# Patient Record
Sex: Female | Born: 1935
Health system: Southern US, Community
[De-identification: ages and names within clinical notes are randomized; demographics above are authoritative.]

## PROBLEM LIST (undated history)

## (undated) DIAGNOSIS — E785 Hyperlipidemia, unspecified: Secondary | ICD-10-CM

## (undated) DIAGNOSIS — J189 Pneumonia, unspecified organism: Secondary | ICD-10-CM

## (undated) DIAGNOSIS — A419 Sepsis, unspecified organism: Secondary | ICD-10-CM

## (undated) DIAGNOSIS — F0390 Unspecified dementia without behavioral disturbance: Secondary | ICD-10-CM

## (undated) DIAGNOSIS — R569 Unspecified convulsions: Secondary | ICD-10-CM

## (undated) DIAGNOSIS — N390 Urinary tract infection, site not specified: Secondary | ICD-10-CM

## (undated) DIAGNOSIS — I639 Cerebral infarction, unspecified: Secondary | ICD-10-CM

## (undated) HISTORY — PX: CATARACT EXTRACTION: SUR2

---

## 1999-03-19 ENCOUNTER — Ambulatory Visit (HOSPITAL_COMMUNITY): Admission: RE | Admit: 1999-03-19 | Discharge: 1999-03-19 | Payer: Self-pay | Admitting: Internal Medicine

## 1999-03-19 ENCOUNTER — Encounter: Payer: Self-pay | Admitting: Internal Medicine

## 1999-04-02 ENCOUNTER — Ambulatory Visit (HOSPITAL_COMMUNITY): Admission: RE | Admit: 1999-04-02 | Discharge: 1999-04-02 | Payer: Self-pay | Admitting: Internal Medicine

## 2000-01-21 ENCOUNTER — Encounter: Payer: Self-pay | Admitting: Ophthalmology

## 2000-01-22 ENCOUNTER — Ambulatory Visit (HOSPITAL_COMMUNITY): Admission: RE | Admit: 2000-01-22 | Discharge: 2000-01-23 | Payer: Self-pay | Admitting: Ophthalmology

## 2000-06-28 ENCOUNTER — Other Ambulatory Visit: Admission: RE | Admit: 2000-06-28 | Discharge: 2000-06-28 | Payer: Self-pay | Admitting: Internal Medicine

## 2001-07-13 ENCOUNTER — Encounter: Payer: Self-pay | Admitting: Internal Medicine

## 2001-07-13 ENCOUNTER — Encounter: Admission: RE | Admit: 2001-07-13 | Discharge: 2001-07-13 | Payer: Self-pay | Admitting: Internal Medicine

## 2001-07-25 ENCOUNTER — Encounter: Payer: Self-pay | Admitting: Internal Medicine

## 2001-07-25 ENCOUNTER — Encounter: Admission: RE | Admit: 2001-07-25 | Discharge: 2001-07-25 | Payer: Self-pay | Admitting: Internal Medicine

## 2001-07-25 ENCOUNTER — Encounter (INDEPENDENT_AMBULATORY_CARE_PROVIDER_SITE_OTHER): Payer: Self-pay | Admitting: *Deleted

## 2003-12-12 ENCOUNTER — Ambulatory Visit (HOSPITAL_COMMUNITY): Admission: RE | Admit: 2003-12-12 | Discharge: 2003-12-12 | Payer: Self-pay | Admitting: Gastroenterology

## 2010-01-02 ENCOUNTER — Encounter: Admission: RE | Admit: 2010-01-02 | Discharge: 2010-01-02 | Payer: Self-pay | Admitting: Internal Medicine

## 2010-08-23 DIAGNOSIS — I639 Cerebral infarction, unspecified: Secondary | ICD-10-CM

## 2010-08-23 HISTORY — DX: Cerebral infarction, unspecified: I63.9

## 2011-01-08 NOTE — Op Note (Signed)
Megan [name], Megan [name]                              ACCOUNT NO.:  [id number]   MEDICAL RECORD NO.:  [id number]                   PATIENT TYPE:  AMB   LOCATION:  ENDO                                 FACILITY:  [facility]   PHYSICIAN:  Danise Edge, M.D.                DATE OF BIRTH:  [birth year]   DATE OF PROCEDURE:  12/12/2003  DATE OF DISCHARGE:                                 OPERATIVE REPORT   PROCEDURE:  Screening colonoscopy.   REFERRED BY:  Thora Lance, M.D.   INDICATIONS FOR PROCEDURE:  Ms. Khianna Blazina is an 77 year old female born  [birth year].  Ms. Jansma is scheduled to undergo her first screening  colonoscopy with polypectomy to prevent colon cancer.  Her health  maintenance flexible proctosigmoidoscopy performed in 1998 revealed no  colorectal neoplasia.   ENDOSCOPIST:  Danise Edge, M.D.   PREMEDICATION:  Versed 5 mg, Demerol 50 mg.   DESCRIPTION OF PROCEDURE:  After obtaining informed consent, Ms. Turri was  placed in the left lateral decubitus position. I administered intravenous  Demerol and intravenous Versed to achieve conscious sedation for the  procedure. The patient's blood pressure, oxygen saturation and cardiac  rhythm were monitored throughout the procedure and documented in the medical  record.   Anal inspection and digital rectal exam were normal. The Olympus adjustable  pediatric colonoscope was introduced into the rectum and advanced to the  cecum. Colonic preparation for the exam today was excellent.   RECTUM:  Normal.   SIGMOID COLON AND DESCENDING COLON:  Normal.   SPLENIC FLEXURE:  Normal.   TRANSVERSE COLON:  Normal.   HEPATIC FLEXURE:  Normal.   ASCENDING COLON:  Normal.   CECUM AND ILEOCECAL VALVE:  Normal.   ASSESSMENT:  Normal screening proctocolonoscopy to the cecum.                                               Danise Edge, M.D.    MJ/MEDQ  D:  12/12/2003  T:  12/12/2003  Job:  [id number]   cc:   Thora Lance, M.D.  [address]  [place]  [place] [address]  Fax: [phone]

## 2011-02-18 ENCOUNTER — Encounter: Payer: Self-pay | Admitting: Podiatry

## 2011-03-13 ENCOUNTER — Inpatient Hospital Stay (HOSPITAL_COMMUNITY)
Admission: EM | Admit: 2011-03-13 | Discharge: 2011-03-16 | DRG: 065 | Disposition: A | Discharge status: Rehabilitation Facility | Payer: Medicare Other | Attending: Internal Medicine | Admitting: Internal Medicine

## 2011-03-13 ENCOUNTER — Emergency Department (HOSPITAL_COMMUNITY): Payer: Medicare Other

## 2011-03-13 DIAGNOSIS — I6350 Cerebral infarction due to unspecified occlusion or stenosis of unspecified cerebral artery: Principal | ICD-10-CM | POA: Diagnosis present

## 2011-03-13 DIAGNOSIS — R29810 Facial weakness: Secondary | ICD-10-CM | POA: Diagnosis present

## 2011-03-13 DIAGNOSIS — E119 Type 2 diabetes mellitus without complications: Secondary | ICD-10-CM | POA: Diagnosis present

## 2011-03-13 DIAGNOSIS — Z7982 Long term (current) use of aspirin: Secondary | ICD-10-CM

## 2011-03-13 DIAGNOSIS — I10 Essential (primary) hypertension: Secondary | ICD-10-CM | POA: Diagnosis present

## 2011-03-13 DIAGNOSIS — G8190 Hemiplegia, unspecified affecting unspecified side: Secondary | ICD-10-CM | POA: Diagnosis present

## 2011-03-13 LAB — CBC
Hemoglobin: 12.7 g/dL (ref 12.0–15.0)
MCH: 31 pg (ref 26.0–34.0)
MCV: 91.2 fL (ref 78.0–100.0)
RBC: 4.1 MIL/uL (ref 3.87–5.11)
WBC: 6.8 10*3/uL (ref 4.0–10.5)

## 2011-03-13 LAB — COMPREHENSIVE METABOLIC PANEL
ALT: 10 U/L (ref 0–35)
CO2: 27 mEq/L (ref 19–32)
Calcium: 10.5 mg/dL (ref 8.4–10.5)
Creatinine, Ser: 0.66 mg/dL (ref 0.50–1.10)
GFR calc Af Amer: >60 mL/min (ref >60)
GFR calc non Af Amer: >60 mL/min (ref >60)
Glucose, Bld: 168 mg/dL — ABNORMAL HIGH (ref 70–99)
Sodium: 138 mEq/L (ref 135–145)
Total Protein: 7.3 g/dL (ref 6.0–8.3)

## 2011-03-13 LAB — PROTIME-INR: INR: 0.99 (ref 0.00–1.49)

## 2011-03-13 LAB — DIFFERENTIAL
Lymphocytes Relative: 42 % (ref 12–46)
Lymphs Abs: 2.9 10*3/uL (ref 0.7–4.0)
Monocytes Relative: 7 % (ref 3–12)
Neutro Abs: 3.4 10*3/uL (ref 1.7–7.7)
Neutrophils Relative %: 49 % (ref 43–77)

## 2011-03-13 LAB — CK TOTAL AND CKMB (NOT AT ARMC)
CK, MB: 2.6 ng/mL (ref 0.3–4.0)
Total CK: 108 U/L (ref 7–177)

## 2011-03-13 LAB — TROPONIN I: Troponin I: <0.3 ng/mL (ref <0.30)

## 2011-03-13 LAB — GLUCOSE, CAPILLARY: Glucose-Capillary: 207 mg/dL — ABNORMAL HIGH (ref 70–99)

## 2011-03-14 ENCOUNTER — Inpatient Hospital Stay (HOSPITAL_COMMUNITY): Payer: Medicare Other

## 2011-03-14 ENCOUNTER — Other Ambulatory Visit (HOSPITAL_COMMUNITY): Payer: Medicare Other

## 2011-03-14 LAB — COMPREHENSIVE METABOLIC PANEL
ALT: 9 U/L (ref 0–35)
Alkaline Phosphatase: 93 U/L (ref 39–117)
BUN: 11 mg/dL (ref 6–23)
CO2: 28 mEq/L (ref 19–32)
Chloride: 102 mEq/L (ref 96–112)
GFR calc Af Amer: >60 mL/min (ref >60)
Glucose, Bld: 186 mg/dL — ABNORMAL HIGH (ref 70–99)
Potassium: 4.1 mEq/L (ref 3.5–5.1)
Sodium: 137 mEq/L (ref 135–145)
Total Bilirubin: 0.3 mg/dL (ref 0.3–1.2)
Total Protein: 6.9 g/dL (ref 6.0–8.3)

## 2011-03-14 LAB — DIFFERENTIAL
Eosinophils Relative: 1 % (ref 0–5)
Lymphocytes Relative: 38 % (ref 12–46)
Lymphs Abs: 2 10*3/uL (ref 0.7–4.0)
Monocytes Absolute: 0.5 10*3/uL (ref 0.1–1.0)
Monocytes Relative: 9 % (ref 3–12)
Neutro Abs: 2.8 10*3/uL (ref 1.7–7.7)

## 2011-03-14 LAB — CARDIAC PANEL(CRET KIN+CKTOT+MB+TROPI)
CK, MB: 2.5 ng/mL (ref 0.3–4.0)
CK, MB: 2.4 ng/mL (ref 0.3–4.0)
Total CK: 115 U/L (ref 7–177)
Troponin I: <0.3 ng/mL (ref <0.30)

## 2011-03-14 LAB — CBC
HCT: 38.5 % (ref 36.0–46.0)
Hemoglobin: 13 g/dL (ref 12.0–15.0)
MCH: 31 pg (ref 26.0–34.0)
MCHC: 33.8 g/dL (ref 30.0–36.0)
MCV: 91.7 fL (ref 78.0–100.0)
RDW: 13.4 % (ref 11.5–15.5)

## 2011-03-14 LAB — GLUCOSE, CAPILLARY: Glucose-Capillary: 152 mg/dL — ABNORMAL HIGH (ref 70–99)

## 2011-03-14 LAB — LIPID PANEL
HDL: 54 mg/dL (ref >39)
LDL Cholesterol: 134 mg/dL — ABNORMAL HIGH (ref 0–99)

## 2011-03-14 LAB — HEMOGLOBIN A1C
Hgb A1c MFr Bld: 9.7 % — ABNORMAL HIGH (ref <5.7)
Mean Plasma Glucose: 232 mg/dL — ABNORMAL HIGH (ref <117)

## 2011-03-14 LAB — PROTIME-INR: INR: 1.12 (ref 0.00–1.49)

## 2011-03-15 DIAGNOSIS — I6330 Cerebral infarction due to thrombosis of unspecified cerebral artery: Secondary | ICD-10-CM

## 2011-03-15 LAB — URINE MICROSCOPIC-ADD ON

## 2011-03-15 LAB — URINALYSIS, ROUTINE W REFLEX MICROSCOPIC
Ketones, ur: NEGATIVE mg/dL
Protein, ur: NEGATIVE mg/dL
Urobilinogen, UA: 1 mg/dL (ref 0.0–1.0)

## 2011-03-15 LAB — GLUCOSE, CAPILLARY: Glucose-Capillary: 295 mg/dL — ABNORMAL HIGH (ref 70–99)

## 2011-03-16 ENCOUNTER — Inpatient Hospital Stay (HOSPITAL_COMMUNITY)
Admission: RE | Admit: 2011-03-16 | Discharge: 2011-04-06 | DRG: 945 | Disposition: A | Discharge status: Home Health | Payer: Medicare Other | Source: Other Acute Inpatient Hospital | Attending: Physical Medicine & Rehabilitation | Admitting: Physical Medicine & Rehabilitation

## 2011-03-16 DIAGNOSIS — E119 Type 2 diabetes mellitus without complications: Secondary | ICD-10-CM | POA: Diagnosis present

## 2011-03-16 DIAGNOSIS — E785 Hyperlipidemia, unspecified: Secondary | ICD-10-CM | POA: Diagnosis present

## 2011-03-16 DIAGNOSIS — I10 Essential (primary) hypertension: Secondary | ICD-10-CM | POA: Diagnosis present

## 2011-03-16 DIAGNOSIS — N289 Disorder of kidney and ureter, unspecified: Secondary | ICD-10-CM | POA: Diagnosis present

## 2011-03-16 DIAGNOSIS — I6330 Cerebral infarction due to thrombosis of unspecified cerebral artery: Secondary | ICD-10-CM

## 2011-03-16 DIAGNOSIS — R471 Dysarthria and anarthria: Secondary | ICD-10-CM | POA: Diagnosis present

## 2011-03-16 DIAGNOSIS — G8110 Spastic hemiplegia affecting unspecified side: Secondary | ICD-10-CM

## 2011-03-16 DIAGNOSIS — Z5189 Encounter for other specified aftercare: Principal | ICD-10-CM

## 2011-03-16 DIAGNOSIS — I6350 Cerebral infarction due to unspecified occlusion or stenosis of unspecified cerebral artery: Secondary | ICD-10-CM | POA: Diagnosis present

## 2011-03-16 DIAGNOSIS — I69921 Dysphasia following unspecified cerebrovascular disease: Secondary | ICD-10-CM

## 2011-03-16 DIAGNOSIS — H9190 Unspecified hearing loss, unspecified ear: Secondary | ICD-10-CM | POA: Diagnosis present

## 2011-03-16 DIAGNOSIS — G8190 Hemiplegia, unspecified affecting unspecified side: Secondary | ICD-10-CM | POA: Diagnosis present

## 2011-03-16 DIAGNOSIS — K219 Gastro-esophageal reflux disease without esophagitis: Secondary | ICD-10-CM | POA: Diagnosis present

## 2011-03-16 LAB — GLUCOSE, CAPILLARY
Glucose-Capillary: 177 mg/dL — ABNORMAL HIGH (ref 70–99)
Glucose-Capillary: 231 mg/dL — ABNORMAL HIGH (ref 70–99)
Glucose-Capillary: 131 mg/dL — ABNORMAL HIGH (ref 70–99)
Glucose-Capillary: 157 mg/dL — ABNORMAL HIGH (ref 70–99)

## 2011-03-16 NOTE — H&P (Signed)
[name], [name] NO.:  [id number]  MEDICAL RECORD NO.:  [id number]  LOCATION:  [facility]                         FACILITY:  [facility]  PHYSICIAN:  Pleas Koch, MD        DATE OF BIRTH:  1922/04/17  DATE OF ADMISSION:  03/13/2011 DATE OF DISCHARGE:                             HISTORY & PHYSICAL   CHIEF COMPLAINT:  Right-sided weakness.  HISTORY OF PRESENT ILLNESS:  This is a pleasant 77 year old [place] American female who is known from the last time.  She is having some weakness by her daughters who live with her and who are the main historians.  They stated that she progressively had worsening pain which caused them to call her PCP, Dr. Arther Dames.  They mentioned at that point that it was recommended to them to continue ibuprofen as they thought this was initially arthritis pain.  However, subsequently, she continued to have pain going up the leg and weakness, and was not able to move her lower extremity and then had pain in her right shoulder and weakness accompanied by some slurring of speech which prompted them to call back Dr. Donette Larry who recommended evaluation in the emergency room. She was also noted to have been dropping food with her fork and could not really stand and these were other reasons.  She denied any specific chest pain, shortness of breath, chest complaint, or weakness.  She normally can do most of her IADLs and ADLs other than cooking.  She normally has normal speech and was a little bit disoriented but seems to be more herself now.  She has no fever.  She had no faint.  She had no loss of consciousness.  She did complain of some dizziness but denied any blurred vision.  She has had multiple mini strokes in the past, the last one was a couple of years ago which affected her left side, so the family knew what to look for.  PAST MEDICAL HISTORY: 1. Diabetes mellitus. 2. Blood pressure. 3. No heart issues.  PAST SURGICAL HISTORY:   Nil.  MEDICATIONS: 1. Ibuprofen 200 q.6 p.r.n. 2. Aspirin 325 one tablet daily at bedtime (the patient has not been     taking her aspirin as per daughter in the past couple of weeks). 3. Lantus 15 units daily at bedtime. 4. Amlodipine/benazepril 5/10 one capsule q.a.m. 5. Actos 45 mg 1 tablet q.a.m. 6. Metformin 2 tablets daily with meals.  ALLERGIES:  Nil.  FAMILY HISTORY:  Mother had a stroke 2 years ago and passed away at the age of 85.  Unclear what her father passed away from.  SOCIAL HISTORY:  The patient used to drink years ago.  She has had a colonoscopy in the past.  PHYSICAL EXAMINATION:  VITAL SIGNS:  On admission, the patient had temperature 98.5, blood pressure 146/61, pulse of 75, respirations 20. She is satting 98% on room air. GENERAL:  The patient is alert, oriented, very very hard of hearing. HEENT:  She has a discernible right-sided facial droop.  She has dentures.  She is able to follow my finger with steady gaze in all 4 quadrants. NECK:  Soft,  supple.  I do not appreciate any JVD or bruits. CHEST:  Clinically clear.  S1, S2.  No murmurs, rubs, gallops. ABDOMEN:  Soft, nontender, nondistended. NEUROLOGIC:  She has probable grade 4 strength in 3 extremities and coordination was assessed with finger-nose-finger test but the patient did this very slowly and was not understanding this.  Her gait was not assessed.  Her reflexes were equivocal in the lower extremities but 3/3 in the right upper extremity.  She is not able to lift right lower extremity off the bed at all.  She is not able to press down on my fingers or lift.  She not able to plantar or dorsiflex on the right side.  ADMISSION LABORATORY DATA:  CBC:  WBC 6.8, hemoglobin 12.7, hematocrit 37.4, platelets 233, INR was 0.99, troponin x1 was less than 0.30.  BMP: Sodium 138, potassium 4.3.  BUN and creatinine 13/0.66, glucose 168. LFTs are normal except for albumin of 3.3.  IMAGING STUDIES:  CT of  the head without contrast showed, Moderate, no acute intracranial abnormality except moderate cortical atrophy and moderate-to-severe cerebellar atrophy with severe chronic microvascular ischemic changes in white matter with old lacunar strokes in basal ganglia bilaterally.  IMPRESSION/ASSESSMENT: 1. Transient ischemic attack versus mid cerebral artery     cerebrovascular accident.  Her findings are suspicious for a     cerebrovascular accident on the left side, affecting predominately     her right lower extremity.  She is not on aspirin at the present     time and would likely benefit.  I will discontinue her aspirin and     start her on Aggrenox as she seems to have been protected aspirin     while she was on it but then broke through (actually stopped taking ASA) and had another     cerebrovascular accident.  It is unclear at this point whether she     has any other sources such as carotid artery embolism versus any     more exotic findings.  However, given her advanced age, I am     hesitant to order exhaustive workup as her baseline functioning,     although is good, would be significantly altered by this current  stroke.  I will leave this decision up to her primary care     physician, Dr. [name] and Dr. Arther Dames.  I will get Physical     Therapy, Occupational Therapy to see her and likely she may benefit     from a neurological consult if deemed appropriate by physician.     Otherwise, I think the patient would be safe to be discharged to a     nursing facility to gain back her strength and a goals-of-care     discussion needs to take place between family members as well as     primary care physician.  The patient presented outside the 3-hour     tPA window 2. Diabetes mellitus.  I will order an A1c to determine her blood     sugar control. 3. Hypertension.  This is uncontrolled at present time with her last     blood pressure being 151/66.  We will allow her blood  pressure to     be slightly high over the next 24 hours. 4. We will also cycle her cardiac enzymes to ensure that there is no     cardiogenic cause for this.  The patient will be admitted to Dr. [name] and will  be picked up in the morning and informed with the answering service at [phone].  The patient is a full code.  I have discussed extensively with daughter, Inocencio Homes at phone number [phone] as well as [name] at [phone] the plan of care and they are in agreement with the same.          ______________________________ Pleas Koch, MD     JS/MEDQ  D:  03/13/2011  T:  03/13/2011  Job:  [id number]  Electronically Signed by Pleas Koch MD on 03/16/2011 04:33:10 PM

## 2011-03-16 NOTE — H&P (Signed)
  NAME:  Megan [name], Megan [name]                  ACCOUNT NO.:  [id number]  MEDICAL RECORD NO.:  [id number]  LOCATION:  [facility]                         FACILITY:  [facility]  PHYSICIAN:  Pleas Koch, MD        DATE OF BIRTH:  Sep 08, 1921  DATE OF ADMISSION:  03/13/2011 DATE OF DISCHARGE:                             HISTORY & PHYSICAL   ADDENDUM:  EKG done today showed regular sinus rhythm, PR interval of 0.20 making it first-degree AV block, accessed above 30 degrees, QRS progression, gross precordium is leads V3, V4, there is no ST-T wave elevation other than J-point elevation V3, I do not appreciate any Q- waves.  Otherwise normal compared with an old EKG.          ______________________________ Pleas Koch, MD     JS/MEDQ  D:  03/13/2011  T:  03/13/2011  Job:  [id number]  Electronically Signed by Pleas Koch MD on 03/16/2011 04:33:14 PM

## 2011-03-17 DIAGNOSIS — G8110 Spastic hemiplegia affecting unspecified side: Secondary | ICD-10-CM

## 2011-03-17 DIAGNOSIS — I6330 Cerebral infarction due to thrombosis of unspecified cerebral artery: Secondary | ICD-10-CM

## 2011-03-17 DIAGNOSIS — I69921 Dysphasia following unspecified cerebrovascular disease: Secondary | ICD-10-CM

## 2011-03-17 LAB — URINE MICROSCOPIC-ADD ON

## 2011-03-17 LAB — DIFFERENTIAL
Basophils Absolute: 0 10*3/uL (ref 0.0–0.1)
Eosinophils Relative: 1 % (ref 0–5)
Lymphocytes Relative: 31 % (ref 12–46)
Lymphs Abs: 2.2 10*3/uL (ref 0.7–4.0)
Neutro Abs: 4.5 10*3/uL (ref 1.7–7.7)
Neutrophils Relative %: 62 % (ref 43–77)

## 2011-03-17 LAB — URINALYSIS, ROUTINE W REFLEX MICROSCOPIC
Bilirubin Urine: NEGATIVE
Hgb urine dipstick: NEGATIVE
Ketones, ur: NEGATIVE mg/dL
Nitrite: NEGATIVE
pH: 5.5 (ref 5.0–8.0)

## 2011-03-17 LAB — GLUCOSE, CAPILLARY
Glucose-Capillary: 146 mg/dL — ABNORMAL HIGH (ref 70–99)
Glucose-Capillary: 171 mg/dL — ABNORMAL HIGH (ref 70–99)
Glucose-Capillary: 176 mg/dL — ABNORMAL HIGH (ref 70–99)

## 2011-03-17 LAB — COMPREHENSIVE METABOLIC PANEL
AST: 12 U/L (ref 0–37)
Albumin: 2.9 g/dL — ABNORMAL LOW (ref 3.5–5.2)
BUN: 28 mg/dL — ABNORMAL HIGH (ref 6–23)
Calcium: 10.1 mg/dL (ref 8.4–10.5)
Chloride: 101 mEq/L (ref 96–112)
Creatinine, Ser: 0.92 mg/dL (ref 0.50–1.10)
Total Bilirubin: 0.2 mg/dL — ABNORMAL LOW (ref 0.3–1.2)
Total Protein: 6.7 g/dL (ref 6.0–8.3)

## 2011-03-17 LAB — CBC
HCT: 38.4 % (ref 36.0–46.0)
MCV: 90.8 fL (ref 78.0–100.0)
RBC: 4.23 MIL/uL (ref 3.87–5.11)
RDW: 13.6 % (ref 11.5–15.5)
WBC: 7.2 10*3/uL (ref 4.0–10.5)

## 2011-03-18 DIAGNOSIS — I6330 Cerebral infarction due to thrombosis of unspecified cerebral artery: Secondary | ICD-10-CM

## 2011-03-18 DIAGNOSIS — G8110 Spastic hemiplegia affecting unspecified side: Secondary | ICD-10-CM

## 2011-03-18 DIAGNOSIS — I69921 Dysphasia following unspecified cerebrovascular disease: Secondary | ICD-10-CM

## 2011-03-18 LAB — GLUCOSE, CAPILLARY
Glucose-Capillary: 114 mg/dL — ABNORMAL HIGH (ref 70–99)
Glucose-Capillary: 159 mg/dL — ABNORMAL HIGH (ref 70–99)
Glucose-Capillary: 130 mg/dL — ABNORMAL HIGH (ref 70–99)
Glucose-Capillary: 145 mg/dL — ABNORMAL HIGH (ref 70–99)

## 2011-03-18 LAB — URINE CULTURE: Culture  Setup Time: 201207250909

## 2011-03-19 LAB — GLUCOSE, CAPILLARY: Glucose-Capillary: 106 mg/dL — ABNORMAL HIGH (ref 70–99)

## 2011-03-20 LAB — GLUCOSE, CAPILLARY
Glucose-Capillary: 97 mg/dL (ref 70–99)
Glucose-Capillary: 163 mg/dL — ABNORMAL HIGH (ref 70–99)

## 2011-03-21 LAB — GLUCOSE, CAPILLARY: Glucose-Capillary: 146 mg/dL — ABNORMAL HIGH (ref 70–99)

## 2011-03-22 DIAGNOSIS — I69921 Dysphasia following unspecified cerebrovascular disease: Secondary | ICD-10-CM

## 2011-03-22 DIAGNOSIS — G8110 Spastic hemiplegia affecting unspecified side: Secondary | ICD-10-CM

## 2011-03-22 DIAGNOSIS — I6330 Cerebral infarction due to thrombosis of unspecified cerebral artery: Secondary | ICD-10-CM

## 2011-03-22 LAB — BASIC METABOLIC PANEL
BUN: 27 mg/dL — ABNORMAL HIGH (ref 6–23)
Calcium: 10.1 mg/dL (ref 8.4–10.5)
GFR calc Af Amer: >60 mL/min (ref >60)
GFR calc non Af Amer: >60 mL/min (ref >60)
Potassium: 4.4 mEq/L (ref 3.5–5.1)
Sodium: 136 mEq/L (ref 135–145)

## 2011-03-22 LAB — GLUCOSE, CAPILLARY: Glucose-Capillary: 140 mg/dL — ABNORMAL HIGH (ref 70–99)

## 2011-03-23 DIAGNOSIS — I6330 Cerebral infarction due to thrombosis of unspecified cerebral artery: Secondary | ICD-10-CM

## 2011-03-23 DIAGNOSIS — G8110 Spastic hemiplegia affecting unspecified side: Secondary | ICD-10-CM

## 2011-03-23 DIAGNOSIS — I69921 Dysphasia following unspecified cerebrovascular disease: Secondary | ICD-10-CM

## 2011-03-23 LAB — GLUCOSE, CAPILLARY
Glucose-Capillary: 122 mg/dL — ABNORMAL HIGH (ref 70–99)
Glucose-Capillary: 151 mg/dL — ABNORMAL HIGH (ref 70–99)
Glucose-Capillary: 170 mg/dL — ABNORMAL HIGH (ref 70–99)

## 2011-03-24 LAB — GLUCOSE, CAPILLARY: Glucose-Capillary: 173 mg/dL — ABNORMAL HIGH (ref 70–99)

## 2011-03-25 DIAGNOSIS — I6330 Cerebral infarction due to thrombosis of unspecified cerebral artery: Secondary | ICD-10-CM

## 2011-03-25 DIAGNOSIS — G8110 Spastic hemiplegia affecting unspecified side: Secondary | ICD-10-CM

## 2011-03-25 DIAGNOSIS — I69921 Dysphasia following unspecified cerebrovascular disease: Secondary | ICD-10-CM

## 2011-03-25 LAB — GLUCOSE, CAPILLARY
Glucose-Capillary: 149 mg/dL — ABNORMAL HIGH (ref 70–99)
Glucose-Capillary: 105 mg/dL — ABNORMAL HIGH (ref 70–99)

## 2011-03-26 DIAGNOSIS — I6330 Cerebral infarction due to thrombosis of unspecified cerebral artery: Secondary | ICD-10-CM

## 2011-03-26 DIAGNOSIS — I69921 Dysphasia following unspecified cerebrovascular disease: Secondary | ICD-10-CM

## 2011-03-26 DIAGNOSIS — G8110 Spastic hemiplegia affecting unspecified side: Secondary | ICD-10-CM

## 2011-03-26 LAB — GLUCOSE, CAPILLARY: Glucose-Capillary: 78 mg/dL (ref 70–99)

## 2011-03-27 LAB — GLUCOSE, CAPILLARY
Glucose-Capillary: 84 mg/dL (ref 70–99)
Glucose-Capillary: 143 mg/dL — ABNORMAL HIGH (ref 70–99)

## 2011-03-28 LAB — GLUCOSE, CAPILLARY
Glucose-Capillary: 133 mg/dL — ABNORMAL HIGH (ref 70–99)
Glucose-Capillary: 127 mg/dL — ABNORMAL HIGH (ref 70–99)
Glucose-Capillary: 133 mg/dL — ABNORMAL HIGH (ref 70–99)

## 2011-03-29 DIAGNOSIS — I69921 Dysphasia following unspecified cerebrovascular disease: Secondary | ICD-10-CM

## 2011-03-29 DIAGNOSIS — G8110 Spastic hemiplegia affecting unspecified side: Secondary | ICD-10-CM

## 2011-03-29 DIAGNOSIS — I6330 Cerebral infarction due to thrombosis of unspecified cerebral artery: Secondary | ICD-10-CM

## 2011-03-29 LAB — GLUCOSE, CAPILLARY

## 2011-03-29 NOTE — Discharge Summary (Signed)
  NAMECYANA, Megan [name] NO.:  [id number]  MEDICAL RECORD NO.:  [id number]  LOCATION:  4100                         FACILITY:  [facility]  PHYSICIAN:  Thora Lance, M.D.  DATE OF BIRTH:  10-14-1921  DATE OF ADMISSION:  03/16/2011 DATE OF DISCHARGE:  03/16/2011                              DISCHARGE SUMMARY   REASON FOR ADMISSION:  This is an 77 year old African American female who presented with right arm and leg weakness and difficulty ambulating and some slurring of speech.  She had a history of stroke in the past.  SIGNIFICANT FINDINGS:  VITAL SIGNS:  Blood pressure 146/61, heart rate 75, respirations 20, temperature 98.5. LUNGS: Clear. HEART:  Regular rate and rhythm without murmur, gallop, or rub. NEUROLOGIC: Grade 4 strength all 4 extremities, reflexes equivocal in the lower extremities but 3/3 in the right upper extremity, unable to lift right lower extremity off the bed.  ADMISSION LABORATORIES:  CBC WBC 6.81, hemoglobin 12.7, platelet count 233, INR 0.99, sodium 138, potassium 4.3, BUN, creatinine 13 and 0.66, glucose 168.  CT scan of the abdomen showed no acute intracranial abnormality, moderate cortical atrophy, moderate-to-severe cerebellar atrophy and severe microvascular ischemic changes, old lacunar strokes in the basal ganglia bilaterally.  HOSPITAL COURSE:  The patient was admitted for possible left brain stroke.  She had an MRI of the brain which did confirm the presence of an acute left upper pontine infarct.  A 2-D echocardiogram showed normal cavity size, ejection fraction 55%.  Carotid ultrasound showed no significant stenosis.  The patient was felt to have a small vessel stroke.  She was switched from aspirin to Aggrenox.  Her right-sided weakness did improve some during the hospitalization.  She was seen by both physical therapy, occupational therapy and speech therapy.  All of these brought to the inpatient rehabilitation would be  advisable and the patient was accepted on the inpatient rehab unit.  Her diabetes was not well-controlled with an A1c of 9.7.  She was continued on Lantus, Actos and metformin with a sliding scale added.  Her blood pressure remained under reasonable control.  DISCHARGE DIAGNOSES: 1. Left pontine stroke. 2. Diabetes mellitus type 2. 3. Hypertension.  PROCEDURES: 1. A CT scan of the brain. 2. MRI of the brain. 3. Carotid ultrasound. 4. A 2-D echocardiogram. 5. Transcranial Dopplers.  DISCHARGE MEDICATIONS: 1. Metformin 1000 mg one twice a day. 2. Aggrenox 25-200 one capsules twice a day. 3. Insulin aspart/NovoLog sliding scale. 4. Simvastatin 20 mg once a day. 5. Actos 45 mg once a day. 6. Amlodipine/benazepril 5/10 mg once a day. 7. Insulin Lantus 50 units subcutaneously at bedtime. 8. Acetaminophen and ibuprofen were discontinued.  DISPOSITION:  Discharged to inpatient rehabilitation.  CODE STATUS:  Full code.  DIET:  Low-sodium, carbohydrate-modified medium calorie diet.  ACTIVITY:  As per physical therapy and occupational therapy.          ______________________________ Thora Lance, M.D.     JJG/MEDQ  D:  03/16/2011  T:  03/16/2011  Job:  [id number]  Electronically Signed by [name] M.D. on 03/29/2011 01:58:49 PM

## 2011-03-30 LAB — GLUCOSE, CAPILLARY
Glucose-Capillary: 114 mg/dL — ABNORMAL HIGH (ref 70–99)
Glucose-Capillary: 121 mg/dL — ABNORMAL HIGH (ref 70–99)

## 2011-03-31 DIAGNOSIS — I69921 Dysphasia following unspecified cerebrovascular disease: Secondary | ICD-10-CM

## 2011-03-31 DIAGNOSIS — I6330 Cerebral infarction due to thrombosis of unspecified cerebral artery: Secondary | ICD-10-CM

## 2011-03-31 DIAGNOSIS — G8110 Spastic hemiplegia affecting unspecified side: Secondary | ICD-10-CM

## 2011-03-31 LAB — BASIC METABOLIC PANEL
GFR calc Af Amer: >60 mL/min (ref >60)
GFR calc non Af Amer: >60 mL/min (ref >60)
Glucose, Bld: 83 mg/dL (ref 70–99)
Potassium: 4.6 mEq/L (ref 3.5–5.1)
Sodium: 138 mEq/L (ref 135–145)

## 2011-03-31 LAB — CBC
MCV: 92.1 fL (ref 78.0–100.0)
Platelets: 269 10*3/uL (ref 150–400)
RBC: 3.65 MIL/uL — ABNORMAL LOW (ref 3.87–5.11)
WBC: 5.9 10*3/uL (ref 4.0–10.5)

## 2011-03-31 LAB — GLUCOSE, CAPILLARY
Glucose-Capillary: 87 mg/dL (ref 70–99)
Glucose-Capillary: 93 mg/dL (ref 70–99)
Glucose-Capillary: 86 mg/dL (ref 70–99)

## 2011-04-01 LAB — GLUCOSE, CAPILLARY
Glucose-Capillary: 89 mg/dL (ref 70–99)
Glucose-Capillary: 109 mg/dL — ABNORMAL HIGH (ref 70–99)
Glucose-Capillary: 83 mg/dL (ref 70–99)

## 2011-04-02 DIAGNOSIS — I69921 Dysphasia following unspecified cerebrovascular disease: Secondary | ICD-10-CM

## 2011-04-02 DIAGNOSIS — I6330 Cerebral infarction due to thrombosis of unspecified cerebral artery: Secondary | ICD-10-CM

## 2011-04-02 DIAGNOSIS — G8110 Spastic hemiplegia affecting unspecified side: Secondary | ICD-10-CM

## 2011-04-02 LAB — URINE MICROSCOPIC-ADD ON

## 2011-04-02 LAB — URINALYSIS, ROUTINE W REFLEX MICROSCOPIC
Ketones, ur: NEGATIVE mg/dL
Protein, ur: NEGATIVE mg/dL
Urobilinogen, UA: 0.2 mg/dL (ref 0.0–1.0)

## 2011-04-02 LAB — GLUCOSE, CAPILLARY
Glucose-Capillary: 87 mg/dL (ref 70–99)
Glucose-Capillary: 154 mg/dL — ABNORMAL HIGH (ref 70–99)

## 2011-04-03 LAB — GLUCOSE, CAPILLARY
Glucose-Capillary: 97 mg/dL (ref 70–99)
Glucose-Capillary: 124 mg/dL — ABNORMAL HIGH (ref 70–99)
Glucose-Capillary: 131 mg/dL — ABNORMAL HIGH (ref 70–99)

## 2011-04-04 LAB — URINE CULTURE
Colony Count: 65000
Special Requests: NEGATIVE

## 2011-04-04 LAB — GLUCOSE, CAPILLARY: Glucose-Capillary: 108 mg/dL — ABNORMAL HIGH (ref 70–99)

## 2011-04-05 ENCOUNTER — Ambulatory Visit: Payer: Medicare Other | Admitting: Physical Therapy

## 2011-04-05 LAB — BASIC METABOLIC PANEL
BUN: 24 mg/dL — ABNORMAL HIGH (ref 6–23)
Calcium: 10.5 mg/dL (ref 8.4–10.5)
Creatinine, Ser: 0.85 mg/dL (ref 0.50–1.10)
GFR calc Af Amer: >60 mL/min (ref >60)
GFR calc non Af Amer: >60 mL/min (ref >60)
Potassium: 4.4 mEq/L (ref 3.5–5.1)

## 2011-04-05 LAB — CBC
HCT: 35.2 % — ABNORMAL LOW (ref 36.0–46.0)
MCH: 30.6 pg (ref 26.0–34.0)
MCHC: 33.2 g/dL (ref 30.0–36.0)
RDW: 13.2 % (ref 11.5–15.5)

## 2011-04-05 LAB — GLUCOSE, CAPILLARY
Glucose-Capillary: 109 mg/dL — ABNORMAL HIGH (ref 70–99)
Glucose-Capillary: 129 mg/dL — ABNORMAL HIGH (ref 70–99)

## 2011-04-06 DIAGNOSIS — I69921 Dysphasia following unspecified cerebrovascular disease: Secondary | ICD-10-CM

## 2011-04-06 DIAGNOSIS — G8110 Spastic hemiplegia affecting unspecified side: Secondary | ICD-10-CM

## 2011-04-06 DIAGNOSIS — I6330 Cerebral infarction due to thrombosis of unspecified cerebral artery: Secondary | ICD-10-CM

## 2011-04-06 LAB — GLUCOSE, CAPILLARY: Glucose-Capillary: 103 mg/dL — ABNORMAL HIGH (ref 70–99)

## 2011-04-06 NOTE — H&P (Signed)
NAMESHELI, DORIN NO.:  [id number]  MEDICAL RECORD NO.:  [id number]  LOCATION:  4140                         FACILITY:  [facility]  PHYSICIAN:  Ranelle Oyster, M.D.DATE OF BIRTH:  Oct 09, 1921  DATE OF ADMISSION:  03/16/2011 DATE OF DISCHARGE:                             HISTORY & PHYSICAL   CHIEF COMPLAINT:  Right-sided weakness.  PRIMARY CARE PHYSICIAN:  Thora Lance, MD  HISTORY OF PRESENT ILLNESS:  This is a pleasant 77 year old white female who is hard of hearing admitted on March 13, 2011, with right-sided weakness and slurred speech.  Head CT showed no acute changes.  MRI of the brain showed a moderate left pontine infarct.  Echocardiogram is unremarkable and Dopplers were negative.  She was placed on Aggrenox for stroke prophylaxis.  Continues with right-sided weakness with decreased balance and problems with knee movement and coordination to prevent hyperextension.  Rehab was consulted and I saw this patient yesterday and felt she could benefit from inpatient stay and adequate social supports.  REVIEW OF SYSTEMS:  Notable for weakness, poor hearing.  Mood has been fair.  I think review of systems is quite difficult given her hearing issues.  She does not have a hearing aid currently.  PAST MEDICAL HISTORY:  Positive for: 1. Diabetes type 2. 2. Hypertension. 3. TIAs. 4. Mild dementia. 5. Auditory hallucinations in the past.  FAMILY HISTORY:  Positive for stroke.  SOCIAL HISTORY:  The patient lives with daughter, is independent for ambulating ADLs.  She has a one-level house with no steps to enter.  She does not smoke or drink.  She has 24-hour supervision from family at home.  ALLERGIES:  None.  HOME MEDICATIONS:  Aspirin, amlodipine/benazepril, Actos, metformin, Lantus.  LABORATORY DATA:  Hemoglobin 13, white count 5.3 with platelets 236. Sodium 137, potassium 4.1, BUN 11, creatinine 0.68.  PHYSICAL EXAMINATION:  VITAL SIGNS:   Blood pressure is 102/61, pulse is 69, respiratory rate is 18, temperature 98.2. GENERAL:  The patient is generally alert and pleasant. HEENT:  Pupils equal, round, reactive to light.  Ear, nose, and throat exam notable for full dentures.  Mucosa is pink and moist, otherwise. NECK:  Supple without JVD or lymphadenopathy. CHEST:  Clear to auscultation bilaterally without wheezes, rales or rhonchi. HEART:  Regular rate and rhythm without murmur, rubs or gallops. ABDOMEN:  Soft, nontender.  Bowel sounds are positive. SKIN:  Intact generally throughout with some chronic changes noted but no obvious breakdown in heels or pressure areas. NEUROLOGIC:  Cranial nerves II-XII normal, decreased left hearing, as well as right facial droop, and tongue deviation.  Speech is dysarthric and very hard to discern.  Sensation is grossly intact for pinprick and light touch on the right and left sides.  Strength is 4-5/5 left upper and lower extremities today.  Right lower extremity is grossly 1-2+/5 and inconsistent.  There may have been some apraxia.  Right upper extremity is 1/5 proximally to 2+ perhaps 3/5 distally but again inconsistent and possible apraxia play.  Judgment was fair.  Orientation was fair given cues but skewed by hearing and dysarthria.  Memory is fair for remote information, inconsistent for  new information.  Mood was cooperative and pleasant.  POST ADMISSION PHYSICIAN EVALUATION: 1. Functional deficit secondary to left pontine stroke with right     hemiparesis, dysarthria.  The patient is hard of hearing at     baseline and has some baseline dementia. 2. The patient is admitted to receive collaborative interdisciplinary     care between the physiatrist, rehab nursing staff, and therapy     team. 3. The patient's level of medical complexity and substantial therapy     needs in context of that medical necessity cannot be provided at a     lesser intensity of care. 4. The patient has  experienced substantial functional loss from her     baseline.  Premorbidly, the patient was independent but had     supervision at home and needed occasional assistance in that manner     but currently she is total assist 10% for transfers and ambulation     20 feet x2 with a walker, max to total transfers and max total with     ADLs.  Judging by the patient's diagnosis, physical exam and     functional history, she has a potential for functional progress     which will result in measurable gains while in inpatient rehab.     The gains will be of substantial and practical use upon discharge     to home facilitating mobility and self-care. 5. The physiatrist will provide 24-hour management of medical needs,     as well as oversight of therapy plan/treatment and provide guidance     as appropriate regarding interaction of the two.  Medical problem     list and plan are below. 6. A 24-hour rehab nursing team will assist in the management of the     patient's skin care needs, as well as augmenting communication,     integrating therapy concepts, techniques, education, bladder     function, etc. 7. PT will assess and treat for lower extremity strength, range of     motion, adaptive techniques, equipment, functional mobility, safety     and family education with goals supervision to min assist. 8. OT will assess and treat for upper extremity use ADLs, adaptive     techniques, equipment, functional mobility, safety, upper extremity     use, neuromuscular education and cognitive and perceptual training     with goals min to occasional mod assist. 9. Speech Language Pathology will follow for communication skills and     speech intelligibility, as well as cognition as appropriate with     goals supervision to perhaps mod assist at times for cognition and     voice clarity. 10.Case management and social worker will assess and treat for     psychosocial issues and discharge planning. 11.Team  conference will be held weekly to assess progress towards     goals and to determine barriers at discharge. 12.The patient has demonstrated sufficient medical stability and     exercise capacity to tolerate at least 3 hours of therapy per day     at least 5 days per week. 13.Estimated length of stay is 2-3 weeks.  Length of stay will depend     somewhat on the patient's progress and the ability to make day-to-     day gains here.  MEDICAL PROBLEM LIST AND PLAN: 1. DVT prophylaxis.  We will add subcu Lovenox given the patient's     poor mobility at this point.  Encourage SCDs/TEDs  as well. 2. Hypertension:  Monitor on Norvasc and Lotensin.  Need to control     blood pressure maximally given stroke risk.  Observe for pressure     regularly with activities on the rehab floor.  Pressure is actually     borderline low today and this will bear watching also. 3. Diabetes type 2:  Check CBGs before meals and at bedtime.  Continue     Actos, metformin, as well as Lantus insulin.  Titrate schedule     according to activity level of intake.  The patient and     specifically the family need further education regarding her     diabetes control.  Sugars have been poorly controlled up to this     point. 4. Decreased hearing:  Consider communication or alternate means of     communication as the patient has difficulty with even slow verbal     language by my examination today.     Ranelle Oyster, M.D.     ZTS/MEDQ  D:  03/16/2011  T:  03/17/2011  Job:  [id number]  cc:   Thora Lance, M.D.  Electronically Signed by Faith Rogue M.D. on 04/06/2011 11:50:25 AM

## 2011-04-07 ENCOUNTER — Ambulatory Visit: Payer: Medicare Other | Admitting: Occupational Therapy

## 2011-04-07 LAB — GLUCOSE, CAPILLARY: Glucose-Capillary: 189 mg/dL — ABNORMAL HIGH (ref 70–99)

## 2011-04-13 ENCOUNTER — Encounter: Payer: Medicare Other | Admitting: *Deleted

## 2011-04-16 ENCOUNTER — Encounter: Payer: Medicare Other | Attending: Physical Medicine & Rehabilitation

## 2011-04-16 ENCOUNTER — Inpatient Hospital Stay: Payer: Medicare Other | Admitting: Physical Medicine & Rehabilitation

## 2011-04-16 DIAGNOSIS — I69922 Dysarthria following unspecified cerebrovascular disease: Secondary | ICD-10-CM | POA: Insufficient documentation

## 2011-04-16 DIAGNOSIS — G8110 Spastic hemiplegia affecting unspecified side: Secondary | ICD-10-CM

## 2011-04-16 DIAGNOSIS — I6330 Cerebral infarction due to thrombosis of unspecified cerebral artery: Secondary | ICD-10-CM

## 2011-04-16 DIAGNOSIS — I10 Essential (primary) hypertension: Secondary | ICD-10-CM | POA: Insufficient documentation

## 2011-04-16 DIAGNOSIS — E785 Hyperlipidemia, unspecified: Secondary | ICD-10-CM | POA: Insufficient documentation

## 2011-04-16 DIAGNOSIS — Z0271 Encounter for disability determination: Secondary | ICD-10-CM

## 2011-04-16 DIAGNOSIS — Z79899 Other long term (current) drug therapy: Secondary | ICD-10-CM | POA: Insufficient documentation

## 2011-04-16 DIAGNOSIS — I69959 Hemiplegia and hemiparesis following unspecified cerebrovascular disease affecting unspecified side: Secondary | ICD-10-CM | POA: Insufficient documentation

## 2011-04-16 DIAGNOSIS — E119 Type 2 diabetes mellitus without complications: Secondary | ICD-10-CM | POA: Insufficient documentation

## 2011-04-16 DIAGNOSIS — IMO0001 Reserved for inherently not codable concepts without codable children: Secondary | ICD-10-CM | POA: Insufficient documentation

## 2011-04-16 NOTE — Assessment & Plan Note (Signed)
An 77 year old female with prior history of diabetes, hypertension as well as some cognitive decline.  She had a left pontine infarct with right hemiparesis and dysarthria.  Risk factors include diabetes and dyslipidemia as well as hypertension.  She has gone through inpatient rehab from March 16, 2011, to April 06, 2011.  She has gone home where she is receiving home health.  She is having some difficulty with sleep some would has to do with positioning.  Daughter talked with therapist and advised hospital bed might allow greater comfort during sleep.  She has some right-sided neck pain occasionally when she wakes up from sleep.  No other new medical problems other than having some cramping pain in the arms and legs.  She has been on simvastatin.  She needs assist with dressing, bathing, toileting, meal prep, household duties, and shopping, can feed herself.  PHYSICAL EXAMINATION:  VITAL SIGNS:  Her blood pressure is 118/41, pulse 80, respirations 18, and O2 saturation 98% on room air. GENERAL:  No acute distress.  Mood and affect appropriate. NEUROLOGIC:  She is 2-/5 right deltoid, biceps, and triceps, 3-/5 knee extension, 0/5 ankle dorsiflexion.  Hand function is trace.  Shoulder has no pain with range of motion.  She has no evidence of spasticity except at the biceps which would be graded as a 2 on the modified Ashworth scale.  IMPRESSION: 1. Left pontine infarct with right hemiparesis and spasticity, mainly     in the biceps, but still at Ashworth 2. 2. Myalgias, may be due to the simvastatin.  We will switch to Crestor     and let Dr. Valentina Lucks follow up with all her medications when he sees     her in the office. 3. Continue outpatient PT and should be starting OT and speech soon.     I will see her back in 1 month.  Discussed plan with the patient     and daughter, agree.     Erick Colace, M.D. Electronically Signed    AEK/MedQ D:  04/16/2011 15:33:08  T:   04/16/2011 23:31:40  Job #:  [id number]  cc:   Thora Lance, M.D. Fax: [phone]

## 2011-04-21 NOTE — Discharge Summary (Signed)
Megan [name], Megan [name] NO.:  [id number]  MEDICAL RECORD NO.:  [id number]  LOCATION:  4029                         FACILITY:  [facility]  PHYSICIAN:  Erick Colace, M.D.DATE OF BIRTH:  1922/06/22  DATE OF ADMISSION:  03/16/2011 DATE OF DISCHARGE:  04/06/2011                              DISCHARGE SUMMARY   DISCHARGE DIAGNOSES: 1. Left pontine infarct with right hemiparesis and dysarthria. 2. Diabetes mellitus type 2. 3. Acute renal insufficiency. 4. Dyslipidemia. 5. Gastroesophageal reflux disease. 6. Hypertension.  HISTORY OF PRESENT ILLNESS:  [name] is an 77 year old female with history of diabetes mellitus, hypertension admitted March 13, 2011, with right-sided weakness and slurred speech.  CT of head done was negative for acute changes.  MRI of brain showed moderate left pontine infarct. An 2-D echo done showed EF of 55% and carotid Dopplers done showed no ICA stenosis.  The patient placed on Aggrenox for CVA prophylaxis.  She currently continues with right-sided weakness with decreased balance on his right knee block to prevent hyperextension.  The patient was evaluated by rehab and we felt that she would benefit from [place] inpatient rehab stay.  PAST MEDICAL HISTORY:  Significant for diabetes mellitus, hypertension and TIAs.  ALLERGIES:  No known drug allergies.  REVIEW OF SYMPTOMS:  Positive for weakness.  FAMILY HISTORY:  Positive for CVA.  SOCIAL HISTORY:  The patient lives with daughter and was independent for ambulation and ADLs prior to admission.  Lives in 1-level home.  No steps at entry.  Does not use any tobacco or alcohol.  FUNCTIONAL HISTORY:  The patient was independent, however, needed 24- hours supervision at home.  FUNCTIONAL STATUS:  The patient is plus to total assist 40% for transfers, plus to total assist 40% for ambulating 20 feet x2 with handheld assist.  PHYSICAL EXAMINATION:  VITALS:  Blood pressure 102/61, pulse  69, respiratory rate 18 and temperature 98.2. GENERAL:  The patient is elderly female, alert and pleasant, in no acute distress. HEENT:  Pupils equal, round and reactive to light.  Oral mucosa is pink and moist with full set dentures noted.  Nares patent. NECK:  Supple without JVD or lymphadenopathy. LUNGS:  Clear to auscultation bilaterally without wheezes, rales or rhonchi. HEART:  Regular rate and rhythm without murmurs, gallops or rubs. ABDOMEN:  Soft and nontender with positive bowel sounds. SKIN:  Intact.  Some chronic changes noted.  Otherwise, no breakdown. NEUROLOGIC:  Cranial nerves II through XII with decreased hearing as well as right facial droop and tongue deviation.  Speech is dysarthric and hard to discern.  Sensation is grossly intact for pinprick and light touch on right and left side.  Strength is 4-5/5 left upper and lower extremity.  Right lower extremity is grossly 1-2+/5 and inconsistent. There maybe some apraxia.  Right upper extremity is 1/5 proximally, 2+ to 3/5 distally inconsistent with some question of apraxia.  Judgment is fair.  Orientation is fair given cues, but decreased hearing and dysarthria.  Memory is fair for remote information and inconsistent for new information.  Mood is cooperative and pleasant.  HOSPITAL COURSE:  Mr. Megan [name] was admitted to rehab on March 16, 2011, for inpatient therapies to consist of PT, OT and speech therapy at least 3 hours 5 days a week.  Past admission physiatrist, rehab RN and therapy team have worked together to provide customized collaborative interdisciplinary care.  The patient's blood pressures were monitored on b.i.d. basis and these were noted to be on low side.  Her Norvasc was discontinued prior to discharge.  Blood pressures are currently ranging from 90s to 120s systolics.  CBGs were checked on a.c. at bedtime basis. Initially, blood sugars were noted to be elevated.  As there is improvement in the patient's  mobility, the patient was noted to have issues with hypoglycemia.  Her Lantus insulin has been adjusted to prevent hypoglycemic episodes.  Currently, blood sugars are ranging from 100-150 range p.o.  P.o. intake is good.  The patient has been continent of bowel and bladder with scheduled toileting by nursing.  The patient continued to have issues with right hemiparesis as improving.  During the patient's stay in rehab, weekly team conferences were held to monitor the patient's progress and goals as well as discuss barriers to discharge.  The patient's family has been very supportive and has been around for multiple therapy sessions.  At admission, the patient was noted to be limited by right hemiparesis with extensor tone right lower extremity.  She was also noted to have impairments in balance reaction, decrease and control and decreased muscular endurance. The patient was at [facility] assist for bed mobility.  She required max to total assist for transfers and mobility, noted to have significant right lean with decrease in weight shifting.  Physical Therapy has been working with the patient on strengthening as well as balance. Currently, the patient is at min assist for scoot level transfers.  Min to mod assist for stand pivot transfers.  She is able to ambulate with a rolling walker with the right AFO with mod assist for 45 feet x3 with rolling walker.  Tends to walk inside the rolling walker and not advanced left greater than right lower extremity requiring min assist for advancing rolling walker as well as cues for posture.  Further followup home health physical therapy to continue working on mobility past discharge.  OT has worked with the patient on self-care tasks.  At admission, the patient was noted to have right upper extremity with decreased truncal control, decreased anticipatory awareness and required mod assist with upper body ADLs, max to total assist for lower body ADLs.  The  patient has currently progressed to being at min assist overall for bathing and dressing tasks.  Family is supportive and is to provide assistance as needed past discharge.  Further followup home health OT to continue past discharge.  Speech Therapy has been working with the patient on dysarthria characterized by oral weakness and imprecise consonant production.  The patient with some baseline memory deficits, but family reported increase in confusion and some decrease in processing and initiation. Progression limited due to decreased hearing and hearing aid not being available.  The patient has made functional gains during this stay.  She is tolerating regular diet and thin liquids without difficulty.  Speech intelligibility improved at phrase level.  Requires supervision with semantic cues which is impacted by patient's inability to self-correct and expression which is inhibited by her hearing loss. Further followup speech therapy to continue past discharge.  The patient has been set up with [facility] and they will follow up for in home therapies past discharge.  DISCHARGE MEDICATIONS:  1. Aggrenox 1 p.o. b.i.d. 2. Protonix 40 mg a day. 3. Zocor 20 mg p.o. nightly. 4. Lantus insulin 5 units subcu nightly. 5. Actos 45 mg a day. 6. Metformin 500 mg 2 p.o. b.i.d. 7. Trazadone 25-50mg  q HS.  DIET:  Carb-modified medium, soft diet.  ACTIVITY LEVEL:  24 hours supervision and assistance.  SPECIAL INSTRUCTIONS:  No strenuous activity.  Walk only with assistance and with use of walker.  [facility] to provide PT, OT, speech therapy and RN.  FOLLOWUP:  The patient to follow up with Dr. Wynn Banker, April 16, 2011, at 2 p.m.  Follow up with Dr. Valentina Lucks in 2 weeks for routine check.     Delle Reining, P.A.   ______________________________ Erick Colace, M.D.    PL/MEDQ  D:  04/06/2011  T:  04/07/2011  Job:  [id number]  cc:   Thora Lance, M.D.  Electronically  Signed by Osvaldo Shipper. on 04/19/2011 11:06:57 AM Electronically Signed by Claudette Laws M.D. on 04/21/2011 10:20:22 AM

## 2011-05-14 ENCOUNTER — Ambulatory Visit: Payer: Medicare Other | Admitting: Physical Medicine & Rehabilitation

## 2011-05-19 ENCOUNTER — Ambulatory Visit: Payer: Medicare Other | Admitting: Occupational Therapy

## 2011-05-19 ENCOUNTER — Ambulatory Visit: Payer: Medicare Other

## 2011-05-21 ENCOUNTER — Ambulatory Visit: Payer: Medicare Other | Attending: Physical Medicine & Rehabilitation | Admitting: Physical Therapy

## 2011-05-21 DIAGNOSIS — I69998 Other sequelae following unspecified cerebrovascular disease: Secondary | ICD-10-CM | POA: Insufficient documentation

## 2011-05-21 DIAGNOSIS — R269 Unspecified abnormalities of gait and mobility: Secondary | ICD-10-CM | POA: Insufficient documentation

## 2011-05-21 DIAGNOSIS — M6281 Muscle weakness (generalized): Secondary | ICD-10-CM | POA: Insufficient documentation

## 2011-05-21 DIAGNOSIS — IMO0001 Reserved for inherently not codable concepts without codable children: Secondary | ICD-10-CM | POA: Insufficient documentation

## 2011-05-24 ENCOUNTER — Ambulatory Visit: Payer: Medicare Other | Attending: Physical Medicine & Rehabilitation | Admitting: *Deleted

## 2011-05-24 DIAGNOSIS — M6281 Muscle weakness (generalized): Secondary | ICD-10-CM | POA: Insufficient documentation

## 2011-05-24 DIAGNOSIS — R269 Unspecified abnormalities of gait and mobility: Secondary | ICD-10-CM | POA: Insufficient documentation

## 2011-05-24 DIAGNOSIS — IMO0001 Reserved for inherently not codable concepts without codable children: Secondary | ICD-10-CM | POA: Insufficient documentation

## 2011-05-24 DIAGNOSIS — I69998 Other sequelae following unspecified cerebrovascular disease: Secondary | ICD-10-CM | POA: Insufficient documentation

## 2011-05-24 DIAGNOSIS — R4789 Other speech disturbances: Secondary | ICD-10-CM | POA: Insufficient documentation

## 2011-05-25 ENCOUNTER — Ambulatory Visit: Payer: Medicare Other

## 2011-05-25 ENCOUNTER — Ambulatory Visit: Payer: Medicare Other | Admitting: Occupational Therapy

## 2011-05-26 ENCOUNTER — Ambulatory Visit: Payer: Medicare Other | Admitting: *Deleted

## 2011-05-28 ENCOUNTER — Ambulatory Visit: Payer: Medicare Other

## 2011-05-28 ENCOUNTER — Ambulatory Visit: Payer: Medicare Other | Admitting: Physical Therapy

## 2011-05-31 ENCOUNTER — Ambulatory Visit: Payer: Medicare Other | Admitting: Physical Therapy

## 2011-06-01 ENCOUNTER — Ambulatory Visit: Payer: Medicare Other

## 2011-06-02 ENCOUNTER — Ambulatory Visit: Payer: Medicare Other | Admitting: Physical Therapy

## 2011-06-03 ENCOUNTER — Ambulatory Visit: Payer: Medicare Other | Admitting: *Deleted

## 2011-06-03 ENCOUNTER — Ambulatory Visit: Payer: Medicare Other | Admitting: Occupational Therapy

## 2011-06-04 ENCOUNTER — Ambulatory Visit: Payer: Medicare Other

## 2011-06-08 ENCOUNTER — Ambulatory Visit: Payer: Medicare Other | Admitting: Physical Therapy

## 2011-06-09 ENCOUNTER — Ambulatory Visit: Payer: Medicare Other | Admitting: Physical Therapy

## 2011-06-09 ENCOUNTER — Ambulatory Visit: Payer: Medicare Other | Admitting: Occupational Therapy

## 2011-06-11 ENCOUNTER — Ambulatory Visit: Payer: Medicare Other | Admitting: Physical Therapy

## 2011-06-11 ENCOUNTER — Ambulatory Visit: Payer: Medicare Other

## 2011-06-11 ENCOUNTER — Ambulatory Visit: Payer: Medicare Other | Admitting: Occupational Therapy

## 2011-06-14 ENCOUNTER — Ambulatory Visit: Payer: Medicare Other | Admitting: Physical Therapy

## 2011-06-14 ENCOUNTER — Ambulatory Visit: Payer: Medicare Other

## 2011-06-16 ENCOUNTER — Ambulatory Visit: Payer: Medicare Other | Admitting: Occupational Therapy

## 2011-06-16 ENCOUNTER — Ambulatory Visit: Payer: Medicare Other | Admitting: Physical Therapy

## 2011-06-18 ENCOUNTER — Ambulatory Visit: Payer: Medicare Other | Admitting: Occupational Therapy

## 2011-06-21 ENCOUNTER — Ambulatory Visit: Payer: Medicare Other | Admitting: Physical Therapy

## 2011-06-22 ENCOUNTER — Ambulatory Visit: Payer: Medicare Other | Admitting: Physical Therapy

## 2011-06-22 ENCOUNTER — Ambulatory Visit: Payer: Medicare Other | Admitting: Occupational Therapy

## 2011-06-24 ENCOUNTER — Ambulatory Visit: Payer: Medicare Other | Admitting: Occupational Therapy

## 2011-06-24 ENCOUNTER — Ambulatory Visit: Payer: Medicare Other | Attending: Physical Medicine & Rehabilitation | Admitting: Physical Therapy

## 2011-06-24 DIAGNOSIS — I69998 Other sequelae following unspecified cerebrovascular disease: Secondary | ICD-10-CM | POA: Insufficient documentation

## 2011-06-24 DIAGNOSIS — R4789 Other speech disturbances: Secondary | ICD-10-CM | POA: Insufficient documentation

## 2011-06-24 DIAGNOSIS — R269 Unspecified abnormalities of gait and mobility: Secondary | ICD-10-CM | POA: Insufficient documentation

## 2011-06-24 DIAGNOSIS — M6281 Muscle weakness (generalized): Secondary | ICD-10-CM | POA: Insufficient documentation

## 2011-06-24 DIAGNOSIS — IMO0001 Reserved for inherently not codable concepts without codable children: Secondary | ICD-10-CM | POA: Insufficient documentation

## 2011-06-28 ENCOUNTER — Ambulatory Visit: Payer: Medicare Other | Admitting: Physical Therapy

## 2011-06-30 ENCOUNTER — Ambulatory Visit: Payer: Medicare Other | Admitting: Occupational Therapy

## 2011-06-30 ENCOUNTER — Ambulatory Visit: Payer: Medicare Other | Admitting: Physical Therapy

## 2011-07-02 ENCOUNTER — Ambulatory Visit: Payer: Medicare Other | Admitting: Occupational Therapy

## 2011-07-02 ENCOUNTER — Ambulatory Visit: Payer: Medicare Other | Admitting: Physical Therapy

## 2011-07-06 ENCOUNTER — Ambulatory Visit: Payer: Medicare Other | Admitting: Physical Therapy

## 2011-07-09 ENCOUNTER — Ambulatory Visit: Payer: Medicare Other | Admitting: Occupational Therapy

## 2011-07-09 ENCOUNTER — Ambulatory Visit: Payer: Medicare Other | Admitting: Physical Therapy

## 2011-07-13 ENCOUNTER — Ambulatory Visit: Payer: Medicare Other | Admitting: Physical Therapy

## 2011-07-13 ENCOUNTER — Ambulatory Visit: Payer: Medicare Other | Admitting: *Deleted

## 2011-07-14 ENCOUNTER — Ambulatory Visit: Payer: Medicare Other | Admitting: *Deleted

## 2011-07-14 ENCOUNTER — Ambulatory Visit: Payer: Medicare Other | Admitting: Physical Therapy

## 2011-07-20 ENCOUNTER — Ambulatory Visit: Payer: Medicare Other | Admitting: Physical Therapy

## 2011-07-20 ENCOUNTER — Ambulatory Visit: Payer: Medicare Other | Admitting: Occupational Therapy

## 2011-07-23 ENCOUNTER — Ambulatory Visit: Payer: Medicare Other | Admitting: Physical Therapy

## 2011-07-23 ENCOUNTER — Ambulatory Visit: Payer: Medicare Other | Admitting: *Deleted

## 2011-07-28 ENCOUNTER — Encounter: Payer: Medicare Other | Admitting: Occupational Therapy

## 2011-07-28 ENCOUNTER — Ambulatory Visit: Payer: Medicare Other | Admitting: Physical Therapy

## 2011-07-30 ENCOUNTER — Ambulatory Visit: Payer: Medicare Other | Admitting: Physical Therapy

## 2011-07-30 ENCOUNTER — Encounter: Payer: Medicare Other | Admitting: Occupational Therapy

## 2011-09-01 ENCOUNTER — Other Ambulatory Visit: Payer: Self-pay | Admitting: Internal Medicine

## 2011-09-01 DIAGNOSIS — Z8673 Personal history of transient ischemic attack (TIA), and cerebral infarction without residual deficits: Secondary | ICD-10-CM

## 2011-09-01 DIAGNOSIS — R29898 Other symptoms and signs involving the musculoskeletal system: Secondary | ICD-10-CM

## 2011-09-02 ENCOUNTER — Ambulatory Visit
Admission: RE | Admit: 2011-09-02 | Discharge: 2011-09-02 | Disposition: A | Discharge status: Home / Self Care | Payer: Medicare Other | Source: Ambulatory Visit | Attending: Internal Medicine | Admitting: Internal Medicine

## 2011-09-02 DIAGNOSIS — R29898 Other symptoms and signs involving the musculoskeletal system: Secondary | ICD-10-CM

## 2011-09-02 DIAGNOSIS — Z8673 Personal history of transient ischemic attack (TIA), and cerebral infarction without residual deficits: Secondary | ICD-10-CM

## 2012-01-06 ENCOUNTER — Emergency Department (HOSPITAL_COMMUNITY): Payer: Medicare Other

## 2012-01-06 ENCOUNTER — Encounter (HOSPITAL_COMMUNITY): Payer: Self-pay | Admitting: Emergency Medicine

## 2012-01-06 ENCOUNTER — Inpatient Hospital Stay (HOSPITAL_COMMUNITY)
Admission: EM | Admit: 2012-01-06 | Discharge: 2012-01-10 | DRG: 100 | Disposition: A | Discharge status: Home / Self Care | Payer: Medicare Other | Source: Ambulatory Visit | Attending: Internal Medicine | Admitting: Internal Medicine

## 2012-01-06 DIAGNOSIS — R569 Unspecified convulsions: Principal | ICD-10-CM | POA: Diagnosis present

## 2012-01-06 DIAGNOSIS — I69959 Hemiplegia and hemiparesis following unspecified cerebrovascular disease affecting unspecified side: Secondary | ICD-10-CM

## 2012-01-06 DIAGNOSIS — J189 Pneumonia, unspecified organism: Secondary | ICD-10-CM | POA: Diagnosis present

## 2012-01-06 DIAGNOSIS — E1165 Type 2 diabetes mellitus with hyperglycemia: Secondary | ICD-10-CM | POA: Diagnosis present

## 2012-01-06 DIAGNOSIS — D72829 Elevated white blood cell count, unspecified: Secondary | ICD-10-CM

## 2012-01-06 DIAGNOSIS — Z8673 Personal history of transient ischemic attack (TIA), and cerebral infarction without residual deficits: Secondary | ICD-10-CM

## 2012-01-06 DIAGNOSIS — IMO0002 Reserved for concepts with insufficient information to code with codable children: Secondary | ICD-10-CM | POA: Diagnosis present

## 2012-01-06 DIAGNOSIS — G459 Transient cerebral ischemic attack, unspecified: Secondary | ICD-10-CM | POA: Diagnosis present

## 2012-01-06 DIAGNOSIS — I10 Essential (primary) hypertension: Secondary | ICD-10-CM | POA: Diagnosis present

## 2012-01-06 DIAGNOSIS — E119 Type 2 diabetes mellitus without complications: Secondary | ICD-10-CM | POA: Diagnosis present

## 2012-01-06 DIAGNOSIS — D696 Thrombocytopenia, unspecified: Secondary | ICD-10-CM

## 2012-01-06 DIAGNOSIS — G40309 Generalized idiopathic epilepsy and epileptic syndromes, not intractable, without status epilepticus: Secondary | ICD-10-CM

## 2012-01-06 DIAGNOSIS — Z87891 Personal history of nicotine dependence: Secondary | ICD-10-CM

## 2012-01-06 DIAGNOSIS — G40109 Localization-related (focal) (partial) symptomatic epilepsy and epileptic syndromes with simple partial seizures, not intractable, without status epilepticus: Secondary | ICD-10-CM

## 2012-01-06 DIAGNOSIS — I498 Other specified cardiac arrhythmias: Secondary | ICD-10-CM | POA: Diagnosis present

## 2012-01-06 DIAGNOSIS — E785 Hyperlipidemia, unspecified: Secondary | ICD-10-CM | POA: Diagnosis present

## 2012-01-06 DIAGNOSIS — D649 Anemia, unspecified: Secondary | ICD-10-CM | POA: Diagnosis present

## 2012-01-06 HISTORY — DX: Cerebral infarction, unspecified: I63.9

## 2012-01-06 HISTORY — DX: Unspecified convulsions: R56.9

## 2012-01-06 HISTORY — DX: Hyperlipidemia, unspecified: E78.5

## 2012-01-06 LAB — COMPREHENSIVE METABOLIC PANEL
AST: 14 U/L (ref 0–37)
Albumin: 3.6 g/dL (ref 3.5–5.2)
Alkaline Phosphatase: 67 U/L (ref 39–117)
Chloride: 97 mEq/L (ref 96–112)
Creatinine, Ser: 0.66 mg/dL (ref 0.50–1.10)
Potassium: 4.1 mEq/L (ref 3.5–5.1)
Total Bilirubin: 0.2 mg/dL — ABNORMAL LOW (ref 0.3–1.2)
Total Protein: 7.5 g/dL (ref 6.0–8.3)

## 2012-01-06 LAB — PROTIME-INR
INR: 1.06 (ref 0.00–1.49)
Prothrombin Time: 14 seconds (ref 11.6–15.2)

## 2012-01-06 LAB — CBC
MCH: 31.2 pg (ref 26.0–34.0)
MCHC: 32.9 g/dL (ref 30.0–36.0)
Platelets: 323 10*3/uL (ref 150–400)
RDW: 13.6 % (ref 11.5–15.5)

## 2012-01-06 LAB — DIFFERENTIAL
Basophils Absolute: 0 10*3/uL (ref 0.0–0.1)
Basophils Relative: 0 % (ref 0–1)
Eosinophils Absolute: 0 10*3/uL (ref 0.0–0.7)
Neutro Abs: 14.2 10*3/uL — ABNORMAL HIGH (ref 1.7–7.7)
Neutrophils Relative %: 77 % (ref 43–77)

## 2012-01-06 LAB — URINALYSIS, ROUTINE W REFLEX MICROSCOPIC
Bilirubin Urine: NEGATIVE
Glucose, UA: 100 mg/dL — AB
Hgb urine dipstick: NEGATIVE
Ketones, ur: NEGATIVE mg/dL
Leukocytes, UA: NEGATIVE
pH: 6.5 (ref 5.0–8.0)

## 2012-01-06 LAB — CK TOTAL AND CKMB (NOT AT ARMC)
CK, MB: 1.7 ng/mL (ref 0.3–4.0)
Total CK: 40 U/L (ref 7–177)

## 2012-01-06 LAB — POCT I-STAT, CHEM 8
Calcium, Ion: 1.34 mmol/L — ABNORMAL HIGH (ref 1.12–1.32)
Glucose, Bld: 174 mg/dL — ABNORMAL HIGH (ref 70–99)
HCT: 32 % — ABNORMAL LOW (ref 36.0–46.0)
Hemoglobin: 10.9 g/dL — ABNORMAL LOW (ref 12.0–15.0)
TCO2: 24 mmol/L (ref 0–100)

## 2012-01-06 LAB — APTT: aPTT: 28 seconds (ref 24–37)

## 2012-01-06 MED ORDER — MOXIFLOXACIN HCL IN NACL 400 MG/250ML IV SOLN
400.0000 mg | INTRAVENOUS | Status: DC
  Administered 2012-01-06 – 2012-01-07 (×2): 400 mg via INTRAVENOUS
  Filled 2012-01-06 (×3): qty 250

## 2012-01-06 MED ORDER — ONDANSETRON HCL 4 MG/2ML IJ SOLN
INTRAMUSCULAR | Status: AC
  Administered 2012-01-06: 4 mg via INTRAVENOUS
  Filled 2012-01-06: qty 2

## 2012-01-06 MED ORDER — ONDANSETRON HCL 4 MG/2ML IJ SOLN
4.0000 mg | Freq: Once | INTRAMUSCULAR | Status: AC
  Administered 2012-01-06: 4 mg via INTRAVENOUS

## 2012-01-06 MED ORDER — ONDANSETRON HCL 4 MG/2ML IJ SOLN: 4.0000 mg | Freq: Three times a day (TID) | INTRAMUSCULAR | Status: DC | PRN

## 2012-01-06 NOTE — ED Provider Notes (Signed)
History     CSN: [id number]  Arrival date & time 01/06/12  2046   First MD Initiated Contact with Patient 01/06/12 2050      No chief complaint on file.   (Consider location/radiation/quality/duration/timing/severity/associated sxs/prior treatment) The history is provided by a relative. The history is limited by the condition of the patient (aphasia).   78 year old female was brought in by ambulance as a possible stroke. Family member who is with her states that she was in her usual state of health ambulating with a walker and still 7:15 PM when caregiver noted that she was not able to hold things in her right hand and does not have her stand on her right leg. She does have a history of a stroke in 2012 with some residual right hemiparesis. She has had some nausea and vomiting as well. Patient is nonverbal.  No past medical history on file.  No past surgical history on file.  No family history on file.  History  Substance Use Topics  . Smoking status: Not on file  . Smokeless tobacco: Not on file  . Alcohol Use: Not on file    OB History    No data available      Review of Systems  Unable to perform ROS: Mental status change    Allergies  Review of patient's allergies indicates no known allergies.  Home Medications   Current Outpatient Rx  Name Route Sig Dispense Refill  . AMLODIPINE BESY-BENAZEPRIL HCL 5-10 MG PO CAPS Oral Take 1 capsule by mouth daily.    . ASPIRIN-DIPYRIDAMOLE ER 25-200 MG PO CP12 Oral Take 1 capsule by mouth 2 (two) times daily.    . INSULIN GLARGINE 100 UNIT/ML [place] SOLN Subcutaneous Inject 15 Units into the skin at bedtime.    Marland Kitchen METFORMIN HCL 500 MG PO TABS Oral Take 1,000 mg by mouth 2 (two) times daily with a meal.    . PANTOPRAZOLE SODIUM 40 MG PO TBEC Oral Take 40 mg by mouth every evening.    Marland Kitchen ROSUVASTATIN CALCIUM 5 MG PO TABS Oral Take 5 mg by mouth every Monday, Wednesday, and Friday.      There were no vitals taken for this  visit.  Physical Exam  Nursing note and vitals reviewed.  78 year old female who is awake and alert and in no acute distress. Head is normocephalic and atraumatic. PERRLA, EOMI. She does not cooperate well for neurologic testing but there seems to be a mild left central facial droop. Neck is nontender and supple without bruit. Back is nontender. Lungs are clear without rales, wheezes, or rhonchi. Heart has regular rate and rhythm without murmur. Abdomen is soft, flat, nontender. Extremities have 2+ edema no cyanosis. Skin is warm and dry without rash. Neurologic: She is awake and alert and and does follow commands although inconsistently. She is nonverbal. Cranial nerves are significant for possible mild left central facial droop. There is a moderate right hemiparesis with strength 3/5 compared with strength 4+/5 on the left. Deep tendon reflexes are symmetric. There is no Babinski reflexe.  ED Course  Procedures (including critical care time)  Results for orders placed during the hospital encounter of 01/06/12  PROTIME-INR      Component Value Range   Prothrombin Time 14.0  11.6 - 15.2 (seconds)   INR 1.06  0.00 - 1.49   APTT      Component Value Range   aPTT 28  24 - 37 (seconds)  CBC  Component Value Range   WBC 18.6 (*) 4.0 - 10.5 (K/uL)   RBC 3.37 (*) 3.87 - 5.11 (MIL/uL)   Hemoglobin 10.5 (*) 12.0 - 15.0 (g/dL)   HCT 16.1 (*) 09.6 - 46.0 (%)   MCV 94.7  78.0 - 100.0 (fL)   MCH 31.2  26.0 - 34.0 (pg)   MCHC 32.9  30.0 - 36.0 (g/dL)   RDW 04.5  40.9 - 81.1 (%)   Platelets 323  150 - 400 (K/uL)  DIFFERENTIAL      Component Value Range   Neutrophils Relative 77  43 - 77 (%)   Neutro Abs 14.2 (*) 1.7 - 7.7 (K/uL)   Lymphocytes Relative 17  12 - 46 (%)   Lymphs Abs 3.1  0.7 - 4.0 (K/uL)   Monocytes Relative 7  3 - 12 (%)   Monocytes Absolute 1.2 (*) 0.1 - 1.0 (K/uL)   Eosinophils Relative 0  0 - 5 (%)   Eosinophils Absolute 0.0  0.0 - 0.7 (K/uL)   Basophils Relative 0  0 - 1  (%)   Basophils Absolute 0.0  0.0 - 0.1 (K/uL)  COMPREHENSIVE METABOLIC PANEL      Component Value Range   Sodium 134 (*) 135 - 145 (mEq/L)   Potassium 4.1  3.5 - 5.1 (mEq/L)   Chloride 97  96 - 112 (mEq/L)   CO2 25  19 - 32 (mEq/L)   Glucose, Bld 167 (*) 70 - 99 (mg/dL)   BUN 11  6 - 23 (mg/dL)   Creatinine, Ser 9.14  0.50 - 1.10 (mg/dL)   Calcium 78.2  8.4 - 10.5 (mg/dL)   Total Protein 7.5  6.0 - 8.3 (g/dL)   Albumin 3.6  3.5 - 5.2 (g/dL)   AST 14  0 - 37 (U/L)   ALT 8  0 - 35 (U/L)   Alkaline Phosphatase 67  39 - 117 (U/L)   Total Bilirubin 0.2 (*) 0.3 - 1.2 (mg/dL)   GFR calc non Af Amer 77 (*) >90 (mL/min)   GFR calc Af Amer 89 (*) >90 (mL/min)  CK TOTAL AND CKMB      Component Value Range   Total CK 40  7 - 177 (U/L)   CK, MB 1.7  0.3 - 4.0 (ng/mL)   Relative Index RELATIVE INDEX IS INVALID  0.0 - 2.5   TROPONIN I      Component Value Range   Troponin I <0.30  <0.30 (ng/mL)  POCT I-STAT, CHEM 8      Component Value Range   Sodium 139  135 - 145 (mEq/L)   Potassium 4.0  3.5 - 5.1 (mEq/L)   Chloride 103  96 - 112 (mEq/L)   BUN 11  6 - 23 (mg/dL)   Creatinine, Ser 9.56  0.50 - 1.10 (mg/dL)   Glucose, Bld 213 (*) 70 - 99 (mg/dL)   Calcium, Ion 0.86 (*) 1.12 - 1.32 (mmol/L)   TCO2 24  0 - 100 (mmol/L)   Hemoglobin 10.9 (*) 12.0 - 15.0 (g/dL)   HCT 57.8 (*) 46.9 - 46.0 (%)  GLUCOSE, CAPILLARY      Component Value Range   Glucose-Capillary 174 (*) 70 - 99 (mg/dL)   Comment 1 Documented in Chart     Comment 2 Notify RN     Ct Head Wo Contrast  01/06/2012  *RADIOLOGY REPORT*  Clinical Data: Code stroke, right-sided weakness.  CT HEAD WITHOUT CONTRAST  Technique:  Contiguous axial images were obtained from the  base of the skull through the vertex without contrast.  Comparison: MRI head 09/02/2011.  Most recent CT head 03/13/2011.  Findings: There is no evidence for acute infarction, intracranial hemorrhage, mass lesion, hydrocephalus, or extra-axial fluid. Moderate atrophy  is present with moderate to severe chronic microvascular ischemic change.  Calvarium intact.  Clear sinuses and mastoids.  Vascular calcification.  Similar appearance to priors.  IMPRESSION: Atrophy and chronic microvascular ischemic change.  No visible acute stroke or bleed.  Critical Value/emergent results were called by telephone at the time of interpretation on  01/06/2012  at 9:07 p.m.  to  Dr. Preston Fleeting, who verbally acknowledged these results.  Original Report Authenticated By: Elsie Stain, M.D.      Date: 01/06/2012  Rate: 124  Rhythm: sinus tachycardia  QRS Axis: normal  Intervals: normal  ST/T Wave abnormalities: normal  Conduction Disutrbances:none  Narrative Interpretation: Sinus tachycardia, old anteroseptal myocardial infarction, low voltage. When compared with ECG of 03/14/2011, heart rate has increased and voltage has decreased.  Old EKG Reviewed: changes noted    1. Transient ischemic attack   2. Leukocytosis     CRITICAL CARE Performed by: ZOXWR,UEAVW   Total critical care time: 40 minutes  Critical care time was exclusive of separately billable procedures and treating other patients.  Critical care was necessary to treat or prevent imminent or life-threatening deterioration.  Critical care was time spent personally by me on the following activities: development of treatment plan with patient and/or surrogate as well as nursing, discussions with consultants, evaluation of patient's response to treatment, examination of patient, obtaining history from patient or surrogate, ordering and performing treatments and interventions, ordering and review of laboratory studies, ordering and review of radiographic studies, pulse oximetry and re-evaluation of patient's condition.   MDM  Probable new stroke. Old records are reviewed and she had been admitted for a right pontine stroke in July 2012. Left facial droop with right arm weakness is suggestive of a brainstem stroke. CT  has been ordered.  CT has come back unremarkable. Patient has been seen by Dr. Roseanne Reno of neurology and she apparently is back to her baseline. On reexam, facial droop is no longer present and arm strength is equal. She is now speaking and her family she is back to her baseline. She may have had a transient ischemic attack but Dr. Roseanne Reno questions complex partial seizure. She will be admitted for further workup and was started on Keppra. Case is discussed with Dr. Toniann Fail of [facility] who agrees to admit the patient.        Dione Booze, MD 01/06/12 2233

## 2012-01-06 NOTE — Consult Note (Signed)
Chief Complaint: Mental status changes with exacerbation of right hemiparesis  HPI: Megan [name] is an 78 y.o. female with a history of left pontine stroke and right hemiparesis, hypertension and diabetes mellitus, presenting with acute onset of confusion as well as apparent increase in weakness of her right arm and right leg. Patient's daughter describes blank stare with no verbal output as well as lack of acknowledgment of being spoken to. She did not follow any simple commands. There was no increase in facial asymmetry. Right extremities were somewhat limp in tone. Patient began to verbally respond after about 15 minutes although she was still quite confused. She was also noted to be confused as well on arrival in the emergency room. CT scan of her head showed no acute intracranial abnormality. Mental status improved back to baseline within 30-40 minutes of arriving in the emergency room, as stated movement and strength of right extremities. She's been on Aggrenox twice a day for stroke prevention.  LSN: [id number] today tPA Given: No: Rapid improvement MRankin: 2  No past medical history on file.  No family history on file.   Medications: Prior to Admission:  Lotrel 5-10 one capsule twice a day Aggrenox 25-200 one twice a day Lantus-100 15 units at bedtime Glucophage 1000 mg twice a day Protonix 40 mg per day Crestor 5 mg each Monday, Wednesday and Friday  Physical Examination: Blood pressure 107/51, pulse 120, temperature 100.2 F (37.9 C), temperature source Oral, resp. rate 25, SpO2 95.00%.  Neurologic Examination: Mental Status: Alert, oriented to time and place.  Speech fluent without evidence of aphasia. Able to follow simple commands without difficulty. Cranial Nerves: II-Visual fields were normal. III/IV/VI-Pupils were equal and reacted. Extraocular movements were full and conjugate.    V/VII-mild right lower facial weakness. VIII-significant hearing loss, right greater than  left. X-moderately dysarthric speech. XII-midline tongue extension Motor: Moderate proximal and mild to moderate distal right upper extremity weakness; severe weakness proximally and distally a right lower extremity; normal strength of left extremities; moderately increased muscle tone involving right lower extremity. Sensory: Reduced perception of tactile sensation over the right lower extremity compared to left. Deep Tendon Reflexes: 1+ on the left and 2+ on the right. Plantars: Mute on the right and flexor on the left Cerebellar: Normal finger-to-nose testing on the left; moderately impaired on the right. Carotid auscultation: Normal   Ct Head Wo Contrast  01/06/2012  *RADIOLOGY REPORT*  Clinical Data: Code stroke, right-sided weakness.  CT HEAD WITHOUT CONTRAST  Technique:  Contiguous axial images were obtained from the base of the skull through the vertex without contrast.  Comparison: MRI head 09/02/2011.  Most recent CT head 03/13/2011.  Findings: There is no evidence for acute infarction, intracranial hemorrhage, mass lesion, hydrocephalus, or extra-axial fluid. Moderate atrophy is present with moderate to severe chronic microvascular ischemic change.  Calvarium intact.  Clear sinuses and mastoids.  Vascular calcification.  Similar appearance to priors.  IMPRESSION: Atrophy and chronic microvascular ischemic change.  No visible acute stroke or bleed.  Critical Value/emergent results were called by telephone at the time of interpretation on  01/06/2012  at 9:07 p.m.  to  Dr. Preston Fleeting, who verbally acknowledged these results.  Original Report Authenticated By: Elsie Stain, M.D.    Assessment: 78 y.o. female presenting with probable new onset complex partial seizure disorder. Recurrent acute stroke or TIA cannot be ruled out at this point, as well. 2. Status post left pontine stroke with residual right hemiparesis in July 2012.  Stroke Risk Factors - diabetes mellitus, hyperlipidemia and  hypertension  Plan: 1. EEG  2. Keppra 500 mg twice a day for seizure prevention 3. MRI, MRA  of the brain without contrast 4. PT consult, OT consult, Speech consult 5. Carotid dopplers 6. Prophylactic therapy-continue Aggrenox 25-200 one twice a day 7. Risk factor modification 8. Telemetry monitoring  C.R. Roseanne Reno, MD [facility] [phone]  01/06/2012, 9:48 PM

## 2012-01-06 NOTE — H&P (Signed)
Megan [name] is an 78 y.o. female.   PCP - Dr.John Valentina Lucks. Chief Complaint: Right upper extremity weakness with staring spells. HPI: 78 year-old female with history of CVA and residual right-sided weakness last year, diabetes mellitus2, hypertension was found to have sudden onset of right upper extremity weakness and staring spells as witnessed by patient's daughter while she was about to give dinner at around 7 PM. Patient did not lose consciousness. Since the symptoms persisted and got called EMS. Patient was brought to the ER and symptoms resolved by 40 minutes. CT head did not show any acute. Neurologist on call Dr. Roseanne Reno evaluated the patient and felt the patient could have had a partial seizure versus TIA. Keppra was started and patient has been admitted for further management and workup. In addition patient is noted to have significant leukocytosis. This is also coughing at the bedside. Patient's daughter states patient has been having running nose for last 2 weeks. Chest x-ray at this time shows possible infiltrate on the left side. Otherwise patient did not complain of any chest pain nausea vomiting abdominal pain or diarrhea. Patient presently is well oriented and daughter feels right extremity weakness has improved and has come to baseline.  Past Medical History  Diagnosis Date  . Stroke   . Diabetes mellitus   . Hyperlipidemia     History reviewed. No pertinent past surgical history.  History reviewed. No pertinent family history. Social History:  reports that she has never smoked. She has quit using smokeless tobacco. Her smokeless tobacco use included Snuff. She reports that she does not drink alcohol or use illicit drugs.  Allergies: No Known Allergies   (Not in a hospital admission)  Results for orders placed during the hospital encounter of 01/06/12 (from the past 48 hour(s))  PROTIME-INR     Status: Normal   Collection Time   01/06/12  8:52 PM      Component Value Range  Comment   Prothrombin Time 14.0  11.6 - 15.2 (seconds)    INR 1.06  0.00 - 1.49    APTT     Status: Normal   Collection Time   01/06/12  8:52 PM      Component Value Range Comment   aPTT 28  24 - 37 (seconds)   CBC     Status: Abnormal   Collection Time   01/06/12  8:52 PM      Component Value Range Comment   WBC 18.6 (*) 4.0 - 10.5 (K/uL)    RBC 3.37 (*) 3.87 - 5.11 (MIL/uL)    Hemoglobin 10.5 (*) 12.0 - 15.0 (g/dL)    HCT 96.0 (*) 45.4 - 46.0 (%)    MCV 94.7  78.0 - 100.0 (fL)    MCH 31.2  26.0 - 34.0 (pg)    MCHC 32.9  30.0 - 36.0 (g/dL)    RDW 09.8  11.9 - 14.7 (%)    Platelets 323  150 - 400 (K/uL)   DIFFERENTIAL     Status: Abnormal   Collection Time   01/06/12  8:52 PM      Component Value Range Comment   Neutrophils Relative 77  43 - 77 (%)    Neutro Abs 14.2 (*) 1.7 - 7.7 (K/uL)    Lymphocytes Relative 17  12 - 46 (%)    Lymphs Abs 3.1  0.7 - 4.0 (K/uL)    Monocytes Relative 7  3 - 12 (%)    Monocytes Absolute 1.2 (*) 0.1 - 1.0 (  K/uL)    Eosinophils Relative 0  0 - 5 (%)    Eosinophils Absolute 0.0  0.0 - 0.7 (K/uL)    Basophils Relative 0  0 - 1 (%)    Basophils Absolute 0.0  0.0 - 0.1 (K/uL)   COMPREHENSIVE METABOLIC PANEL     Status: Abnormal   Collection Time   01/06/12  8:52 PM      Component Value Range Comment   Sodium 134 (*) 135 - 145 (mEq/L)    Potassium 4.1  3.5 - 5.1 (mEq/L)    Chloride 97  96 - 112 (mEq/L)    CO2 25  19 - 32 (mEq/L)    Glucose, Bld 167 (*) 70 - 99 (mg/dL)    BUN 11  6 - 23 (mg/dL)    Creatinine, Ser 1.61  0.50 - 1.10 (mg/dL)    Calcium 09.6  8.4 - 10.5 (mg/dL)    Total Protein 7.5  6.0 - 8.3 (g/dL)    Albumin 3.6  3.5 - 5.2 (g/dL)    AST 14  0 - 37 (U/L) HEMOLYSIS AT THIS LEVEL MAY AFFECT RESULT   ALT 8  0 - 35 (U/L)    Alkaline Phosphatase 67  39 - 117 (U/L)    Total Bilirubin 0.2 (*) 0.3 - 1.2 (mg/dL)    GFR calc non Af Amer 77 (*) >90 (mL/min)    GFR calc Af Amer 89 (*) >90 (mL/min)   CK TOTAL AND CKMB     Status: Normal    Collection Time   01/06/12  8:52 PM      Component Value Range Comment   Total CK 40  7 - 177 (U/L)    CK, MB 1.7  0.3 - 4.0 (ng/mL)    Relative Index RELATIVE INDEX IS INVALID  0.0 - 2.5    TROPONIN I     Status: Normal   Collection Time   01/06/12  8:52 PM      Component Value Range Comment   Troponin I <0.30  <0.30 (ng/mL)   POCT I-STAT, CHEM 8     Status: Abnormal   Collection Time   01/06/12  9:06 PM      Component Value Range Comment   Sodium 139  135 - 145 (mEq/L)    Potassium 4.0  3.5 - 5.1 (mEq/L)    Chloride 103  96 - 112 (mEq/L)    BUN 11  6 - 23 (mg/dL)    Creatinine, Ser 0.45  0.50 - 1.10 (mg/dL)    Glucose, Bld 409 (*) 70 - 99 (mg/dL)    Calcium, Ion 8.11 (*) 1.12 - 1.32 (mmol/L)    TCO2 24  0 - 100 (mmol/L)    Hemoglobin 10.9 (*) 12.0 - 15.0 (g/dL)    HCT 91.4 (*) 78.2 - 46.0 (%)   GLUCOSE, CAPILLARY     Status: Abnormal   Collection Time   01/06/12  9:29 PM      Component Value Range Comment   Glucose-Capillary 174 (*) 70 - 99 (mg/dL)    Comment 1 Documented in Chart      Comment 2 Notify RN     URINALYSIS, ROUTINE W REFLEX MICROSCOPIC     Status: Abnormal   Collection Time   01/06/12 11:06 PM      Component Value Range Comment   Color, Urine YELLOW  YELLOW     APPearance CLEAR  CLEAR     Specific Gravity, Urine 1.015  1.005 - 1.030  pH 6.5  5.0 - 8.0     Glucose, UA 100 (*) NEGATIVE (mg/dL)    Hgb urine dipstick NEGATIVE  NEGATIVE     Bilirubin Urine NEGATIVE  NEGATIVE     Ketones, ur NEGATIVE  NEGATIVE (mg/dL)    Protein, ur NEGATIVE  NEGATIVE (mg/dL)    Urobilinogen, UA 0.2  0.0 - 1.0 (mg/dL)    Nitrite NEGATIVE  NEGATIVE     Leukocytes, UA NEGATIVE  NEGATIVE  MICROSCOPIC NOT DONE ON URINES WITH NEGATIVE PROTEIN, BLOOD, LEUKOCYTES, NITRITE, OR GLUCOSE <1000 mg/dL.   Ct Head Wo Contrast  01/06/2012  *RADIOLOGY REPORT*  Clinical Data: Code stroke, right-sided weakness.  CT HEAD WITHOUT CONTRAST  Technique:  Contiguous axial images were obtained from  the base of the skull through the vertex without contrast.  Comparison: MRI head 09/02/2011.  Most recent CT head 03/13/2011.  Findings: There is no evidence for acute infarction, intracranial hemorrhage, mass lesion, hydrocephalus, or extra-axial fluid. Moderate atrophy is present with moderate to severe chronic microvascular ischemic change.  Calvarium intact.  Clear sinuses and mastoids.  Vascular calcification.  Similar appearance to priors.  IMPRESSION: Atrophy and chronic microvascular ischemic change.  No visible acute stroke or bleed.  Critical Value/emergent results were called by telephone at the time of interpretation on  01/06/2012  at 9:07 p.m.  to  Dr. Preston Fleeting, who verbally acknowledged these results.  Original Report Authenticated By: Elsie Stain, M.D.   Dg Chest Portable 1 View  01/06/2012  *RADIOLOGY REPORT*  Clinical Data: Cough, vomiting, shortness of breath.  Code stroke.  PORTABLE CHEST - 1 VIEW  Comparison: None.  Findings: Shallow inspiration.  Mild cardiac enlargement with normal pulmonary vascularity given the technique.  There is evidence of infiltration or atelectasis in the left lung base. Changes could represent pneumonia.  No blunting of costophrenic angles.  No pneumothorax.  Calcification of the aorta. Degenerative changes in the spine and shoulders.  IMPRESSION: Shallow inspiration with infiltration or atelectasis in the left lung base.  Original Report Authenticated By: Marlon Pel, M.D.    Review of Systems  Constitutional: Negative.   HENT: Negative.   Eyes: Negative.   Respiratory: Negative.   Cardiovascular: Negative.   Gastrointestinal: Negative.   Genitourinary: Negative.   Musculoskeletal: Negative.   Skin: Negative.   Neurological:       Right upper extremity weakness with staring spells.  Endo/Heme/Allergies: Negative.   Psychiatric/Behavioral: Negative.     Blood pressure 114/50, pulse 117, temperature 99.5 F (37.5 C), temperature source  Rectal, resp. rate 22, SpO2 94.00%. Physical Exam  Constitutional: She appears well-developed and well-nourished. No distress.  HENT:  Head: Normocephalic and atraumatic.  Right Ear: External ear normal.  Left Ear: External ear normal.  Mouth/Throat: Oropharynx is clear and moist. No oropharyngeal exudate.  Eyes: Conjunctivae are normal. Pupils are equal, round, and reactive to light. Right eye exhibits no discharge. Left eye exhibits no discharge. No scleral icterus.  Neck: Normal range of motion. Neck supple.  Cardiovascular:       Sinus tachycardia.  Respiratory: Effort normal and breath sounds normal. No respiratory distress. She has no wheezes. She has no rales.  GI: Soft. Bowel sounds are normal. She exhibits no distension. There is no tenderness. There is no rebound.  Musculoskeletal: Normal range of motion. She exhibits no edema and no tenderness.  Neurological: She is alert.       3/5 right upper extremity 2/5 right lower extremity. Left moves 5/5  upper and lower. No obvious Facial asymmetry.Tongue is midline.   Skin: Skin is warm and dry. She is not diaphoretic.     Assessment/Plan #1. TIA versus seizure in a patient with known CVA and residual right-sided weakness - as advised by neurologist we'll get MRI/MRA brain, carotid Doppler, 2-D echo and EEG. Continue Keppra. Patient is on Aggrenox which will be continued. #2. Possible pneumonia - patient has significant leukocytosis and has been having running nose and productive cough for last few days. Chest x-ray shows infiltrates. We will treat this as community-acquired pneumonia and Avelox has been started. #3. Sinus tachycardia - we'll hydrate and closely observe. #4. Anemia - recheck CBC in a.m. #5. Diabetes mellitus2 - continue home medications with close followup with CBGs and sliding scale coverage. #6. Hypertension - continue home medications.  CODE STATUS - full code. I have left a message for Dr. [name] about  this admission.  Eduard Clos. 01/06/2012, 11:58 PM

## 2012-01-06 NOTE — Code Documentation (Addendum)
78yo female arrived via EMS after developing confusion, right sided weakness, facial droop and drooling. Patient has history of hemorrhagic stroke in June/July of 2012 with residual right sided deficits. Code stroke called at 2036, patient arrived at 2046, EDP exam at 2047, stroke team arrived at 2043, patient was LSN by family at 28 and stroke symptoms were discovered at 16. Pt arrived in CT at 2049, phlebotomist arrived at 2043, CT read by Dr. Roseanne Reno at 2105. NIH 09 Code stroke cancelled at 2123 per Dr. Roseanne Reno. Patient had one episode of emesis with EMS and one in room, 4mg  IV Zofran given. Dr. Roseanne Reno discussed with patient's daughter that the event could have been another stroke, tia, or possible seizure.

## 2012-01-07 ENCOUNTER — Inpatient Hospital Stay (HOSPITAL_COMMUNITY): Payer: Medicare Other

## 2012-01-07 DIAGNOSIS — G459 Transient cerebral ischemic attack, unspecified: Secondary | ICD-10-CM

## 2012-01-07 DIAGNOSIS — R569 Unspecified convulsions: Secondary | ICD-10-CM

## 2012-01-07 DIAGNOSIS — I059 Rheumatic mitral valve disease, unspecified: Secondary | ICD-10-CM

## 2012-01-07 LAB — TSH: TSH: 0.37 u[IU]/mL (ref 0.350–4.500)

## 2012-01-07 LAB — RAPID URINE DRUG SCREEN, HOSP PERFORMED
Amphetamines: NOT DETECTED
Barbiturates: NOT DETECTED
Cocaine: NOT DETECTED
Opiates: NOT DETECTED
Tetrahydrocannabinol: NOT DETECTED

## 2012-01-07 LAB — COMPREHENSIVE METABOLIC PANEL
AST: 12 U/L (ref 0–37)
Albumin: 2.9 g/dL — ABNORMAL LOW (ref 3.5–5.2)
BUN: 12 mg/dL (ref 6–23)
Calcium: 9.9 mg/dL (ref 8.4–10.5)
Creatinine, Ser: 0.63 mg/dL (ref 0.50–1.10)

## 2012-01-07 LAB — LIPID PANEL
HDL: 43 mg/dL (ref >39)
LDL Cholesterol: 35 mg/dL (ref 0–99)
Total CHOL/HDL Ratio: 2.1 RATIO
Triglycerides: 53 mg/dL (ref <150)

## 2012-01-07 LAB — CBC
HCT: 27.8 % — ABNORMAL LOW (ref 36.0–46.0)
Hemoglobin: 9.1 g/dL — ABNORMAL LOW (ref 12.0–15.0)
Hemoglobin: 9.7 g/dL — ABNORMAL LOW (ref 12.0–15.0)
MCH: 30.5 pg (ref 26.0–34.0)
MCHC: 32.7 g/dL (ref 30.0–36.0)
MCV: 93 fL (ref 78.0–100.0)
MCV: 93.7 fL (ref 78.0–100.0)
RBC: 3.18 MIL/uL — ABNORMAL LOW (ref 3.87–5.11)
RDW: 13.7 % (ref 11.5–15.5)
WBC: 18.4 10*3/uL — ABNORMAL HIGH (ref 4.0–10.5)

## 2012-01-07 LAB — GLUCOSE, CAPILLARY
Glucose-Capillary: 146 mg/dL — ABNORMAL HIGH (ref 70–99)
Glucose-Capillary: 146 mg/dL — ABNORMAL HIGH (ref 70–99)

## 2012-01-07 MED ORDER — ATORVASTATIN CALCIUM 10 MG PO TABS
10.0000 mg | ORAL_TABLET | Freq: Every day | ORAL | Status: DC
  Administered 2012-01-07: 10 mg via ORAL
  Filled 2012-01-07 (×3): qty 1

## 2012-01-07 MED ORDER — ENOXAPARIN SODIUM 40 MG/0.4ML ~~LOC~~ SOLN
40.0000 mg | SUBCUTANEOUS | Status: DC
  Administered 2012-01-07 – 2012-01-10 (×4): 40 mg via SUBCUTANEOUS
  Filled 2012-01-07 (×4): qty 0.4

## 2012-01-07 MED ORDER — BENAZEPRIL HCL 10 MG PO TABS
10.0000 mg | ORAL_TABLET | Freq: Every day | ORAL | Status: DC
  Filled 2012-01-07: qty 1

## 2012-01-07 MED ORDER — AMLODIPINE BESY-BENAZEPRIL HCL 5-10 MG PO CAPS: 1.0000 | ORAL_CAPSULE | Freq: Every day | ORAL | Status: DC

## 2012-01-07 MED ORDER — SODIUM CHLORIDE 0.9 % IV SOLN: INTRAVENOUS | Status: DC

## 2012-01-07 MED ORDER — ACETAMINOPHEN 325 MG PO TABS
650.0000 mg | ORAL_TABLET | ORAL | Status: DC | PRN
  Filled 2012-01-07: qty 2

## 2012-01-07 MED ORDER — STROKE: EARLY STAGES OF RECOVERY BOOK
Freq: Once | Status: AC
  Administered 2012-01-07: 1
  Filled 2012-01-07: qty 1

## 2012-01-07 MED ORDER — WHITE PETROLATUM GEL
Status: AC
  Administered 2012-01-07: 1
  Filled 2012-01-07: qty 5

## 2012-01-07 MED ORDER — PANTOPRAZOLE SODIUM 40 MG PO TBEC
40.0000 mg | DELAYED_RELEASE_TABLET | Freq: Every evening | ORAL | Status: DC
  Administered 2012-01-07 – 2012-01-09 (×3): 40 mg via ORAL
  Filled 2012-01-07 (×5): qty 1

## 2012-01-07 MED ORDER — INSULIN GLARGINE 100 UNIT/ML ~~LOC~~ SOLN: 15.0000 [IU] | Freq: Every day | SUBCUTANEOUS | Status: DC

## 2012-01-07 MED ORDER — MOXIFLOXACIN HCL IN NACL 400 MG/250ML IV SOLN: 400.0000 mg | INTRAVENOUS | Status: DC

## 2012-01-07 MED ORDER — ASPIRIN-DIPYRIDAMOLE ER 25-200 MG PO CP12
1.0000 | ORAL_CAPSULE | Freq: Two times a day (BID) | ORAL | Status: DC
  Administered 2012-01-07 – 2012-01-09 (×6): 1 via ORAL
  Filled 2012-01-07 (×6): qty 1

## 2012-01-07 MED ORDER — AMLODIPINE BESYLATE 5 MG PO TABS
5.0000 mg | ORAL_TABLET | Freq: Every day | ORAL | Status: DC
  Filled 2012-01-07: qty 1

## 2012-01-07 MED ORDER — LEVETIRACETAM 500 MG PO TABS
500.0000 mg | ORAL_TABLET | Freq: Two times a day (BID) | ORAL | Status: DC
  Administered 2012-01-07 – 2012-01-09 (×6): 500 mg via ORAL
  Filled 2012-01-07 (×6): qty 1

## 2012-01-07 MED ORDER — INSULIN ASPART 100 UNIT/ML ~~LOC~~ SOLN
0.0000 [IU] | Freq: Three times a day (TID) | SUBCUTANEOUS | Status: DC
  Administered 2012-01-07: 1 [IU] via SUBCUTANEOUS
  Administered 2012-01-08: 3 [IU] via SUBCUTANEOUS
  Administered 2012-01-08 – 2012-01-09 (×3): 2 [IU] via SUBCUTANEOUS
  Administered 2012-01-09: 3 [IU] via SUBCUTANEOUS
  Administered 2012-01-10: 1 [IU] via SUBCUTANEOUS

## 2012-01-07 NOTE — Progress Notes (Signed)
Subjective: R sided weakness has improved. Some dry cough  Objective: Vital signs in last 24 hours: Temp:  [97.7 F (36.5 C)-100.2 F (37.9 C)] 97.7 F (36.5 C) (05/17 0627) Pulse Rate:  [96-129] 96  (05/17 0627) Resp:  [18-38] 18  (05/17 0627) BP: (91-134)/(44-56) 98/47 mmHg (05/17 0627) SpO2:  [93 %-100 %] 100 % (05/17 0627) Weight:  [58.7 kg (129 lb 6.6 oz)] 58.7 kg (129 lb 6.6 oz) (05/17 0024) Weight change:  Last BM Date: 01/05/12  Intake/Output from previous day: 05/16 0701 - 05/17 0700 In: -  Out: 550 [Urine:550] Intake/Output this shift:    General appearance: alert and cooperative Resp: clear to auscultation bilaterally Cardio: regular rate and rhythm, S1, S2 normal, no murmur, click, rub or gallop Neurologic: Motor: 5-/5 bilaterally upper and lower extremities  Lab Results:  Basename 01/07/12 0055 01/06/12 2106 01/06/12 2052  WBC 16.9* -- 18.6*  HGB 9.1* 10.9* --  HCT 27.8* 32.0* --  PLT 267 -- 323   BMET  Basename 01/07/12 0055 01/06/12 2106 01/06/12 2052  NA -- 139 134*  K -- 4.0 4.1  CL -- 103 97  CO2 -- -- 25  GLUCOSE -- 174* 167*  BUN -- 11 11  CREATININE 0.66 0.70 --  CALCIUM -- -- 10.4    Studies/Results: Ct Head Wo Contrast  01/06/2012  *RADIOLOGY REPORT*  Clinical Data: Code stroke, right-sided weakness.  CT HEAD WITHOUT CONTRAST  Technique:  Contiguous axial images were obtained from the base of the skull through the vertex without contrast.  Comparison: MRI head 09/02/2011.  Most recent CT head 03/13/2011.  Findings: There is no evidence for acute infarction, intracranial hemorrhage, mass lesion, hydrocephalus, or extra-axial fluid. Moderate atrophy is present with moderate to severe chronic microvascular ischemic change.  Calvarium intact.  Clear sinuses and mastoids.  Vascular calcification.  Similar appearance to priors.  IMPRESSION: Atrophy and chronic microvascular ischemic change.  No visible acute stroke or bleed.  Critical  Value/emergent results were called by telephone at the time of interpretation on  01/06/2012  at 9:07 p.m.  to  Dr. Preston Fleeting, who verbally acknowledged these results.  Original Report Authenticated By: Elsie Stain, M.D.   Dg Chest Portable 1 View  01/06/2012  *RADIOLOGY REPORT*  Clinical Data: Cough, vomiting, shortness of breath.  Code stroke.  PORTABLE CHEST - 1 VIEW  Comparison: None.  Findings: Shallow inspiration.  Mild cardiac enlargement with normal pulmonary vascularity given the technique.  There is evidence of infiltration or atelectasis in the left lung base. Changes could represent pneumonia.  No blunting of costophrenic angles.  No pneumothorax.  Calcification of the aorta. Degenerative changes in the spine and shoulders.  IMPRESSION: Shallow inspiration with infiltration or atelectasis in the left lung base.  Original Report Authenticated By: Marlon Pel, M.D.    Medications: I have reviewed the patient's current medications.  Assessment/Plan: Principal Problem:  *TIA (transient ischemic attack) improved.  Continue aggrenox,MRI today, PT/OT/ST to see. REview old records as echo and carotid u/s may have been done last admission.  EEG scheduled Active Problems:  Community acquired pneumonia possible pneumonia, day 2 avelox  Diabetes mellitus controlled hold lantus and metformin while npo, on SSI  HTN (hypertension) BP low, hold BP meds and follow  H/O: CVA (cardiovascular accident)   LOS: 1 day   Cheng Dec [name] 01/07/2012, 8:01 AM

## 2012-01-07 NOTE — Evaluation (Signed)
Clinical/Bedside Swallow Evaluation Patient Details  Name: Megan [name] MRN: [id number] Date of Birth: [birth year]  Today's Date: 01/07/2012 Time: 0900-0925 SLP Time Calculation (min): 25 min  Past Medical History:  Past Medical History  Diagnosis Date  . Stroke   . Diabetes mellitus   . Hyperlipidemia    Past Surgical History: History reviewed. No pertinent past surgical history. HPI:  78 year-old female with history of CVA and residual right-sided weakness last year, diabetes mellitus2, hypertension was found to have sudden onset of right upper extremity weakness and staring spells as witnessed by patient's daughter . CT negative for acute CVA. MD suspecting TIA.    Assessment / Plan / Recommendation Clinical Impression  Suspect that overall swallowing function is at baseline with a mild oro-pharyngeal dysphagia present characterized by mildly delayed oral transit of bolus and suspected delayed swallow initiation but with what appears to be full airway protection with no overt s/s of aspiration observed. Swallowing judged function and patient safe to resume a regular diet, thin liquids. Daughter present and supportive. Given mild oral phase deficits increasing risk of aspiration, SLP will f/u briefly at bedside to ensure tolerance of diet.     Aspiration Risk  Moderate    Diet Recommendation Regular;Thin liquid   Other  Recommendations     Follow Up Recommendations  None    Frequency and Duration min 2x/week  2 weeks   Pertinent Vitals/Pain n/a    SLP Swallow Goals Patient will consume recommended diet without observed clinical signs of aspiration with: Minimal assistance Swallow Study Goal #1 - Progress: Not Met Patient will utilize recommended strategies during swallow to increase swallowing safety with: Minimal assistance Swallow Study Goal #2 - Progress: Not met   Swallow Study Prior Functional Status       General Date of Onset: 01/06/12 HPI: 78 year-old female  with history of CVA and residual right-sided weakness last year, diabetes mellitus2, hypertension was found to have sudden onset of right upper extremity weakness and staring spells as witnessed by patient's daughter . CT negative for acute CVA. MD suspecting TIA.  Type of Study: Bedside swallow evaluation Previous Swallow Assessment: none per daughter or records Diet Prior to this Study: NPO (on a regular diet, thin liquid PTA) Temperature Spikes Noted: No Respiratory Status: Room air Behavior/Cognition: Alert;Cooperative;Pleasant mood;Hard of hearing Oral Cavity - Dentition: Dentures, top;Dentures, bottom Vision: Functional for self-feeding Patient Positioning: Upright in bed Baseline Vocal Quality: Clear Volitional Cough: Strong;Congested Volitional Swallow: Able to elicit    Oral/Motor/Sensory Function Overall Oral Motor/Sensory Function: Impaired at baseline Labial ROM: Reduced right Labial Symmetry: Abnormal symmetry right Labial Strength: Reduced Labial Sensation: Reduced Lingual ROM: Within Functional Limits Lingual Symmetry: Within Functional Limits Lingual Strength: Within Functional Limits Lingual Sensation: Within Functional Limits Velum: Within Functional Limits Mandible: Within Functional Limits   Ice Chips Ice chips: Not tested   Thin Liquid Thin Liquid: Impaired Presentation: Cup;Straw Oral Phase Impairments: Reduced labial seal Oral Phase Functional Implications: Left anterior spillage Pharyngeal  Phase Impairments: Delayed Swallow (suspected delay in swallow initiation)    Nectar Thick Nectar Thick Liquid: Not tested   Honey Thick Honey Thick Liquid: Not tested   Puree Puree: Within functional limits Presentation: Spoon;Self Fed   Solid Solid: Impaired Presentation: Self Fed Oral Phase Impairments: Impaired anterior to posterior transit (mildly delayed A-P transit however functional)   Megan Durrett MA, CCC-SLP ([phone]  Megan [name] Megan [name] 01/07/2012,9:38  AM

## 2012-01-07 NOTE — Progress Notes (Signed)
  Echocardiogram 2D Echocardiogram has been performed.  Braxen Dobek L 01/07/2012, 10:16 AM

## 2012-01-07 NOTE — Progress Notes (Signed)
*  PRELIMINARY RESULTS* Vascular Ultrasound Carotid Duplex (Doppler) has been completed.   No evidence of internal carotid artery stenosis bilaterally. Bilateral antegrade vertebral artery flow.  Malachy Moan, RDMS, RDCS 01/07/2012, 2:44 PM

## 2012-01-07 NOTE — Progress Notes (Signed)
  At 0400, pt unable to void. Pt very uncomfortable, bladder  distended. Bladder scan showed 700cc. I&O cath done  due to pt discomfort. Resulted in 525cc clear yellow urine. Pt tolerated well.  Will continue to monitor.

## 2012-01-08 DIAGNOSIS — G40109 Localization-related (focal) (partial) symptomatic epilepsy and epileptic syndromes with simple partial seizures, not intractable, without status epilepticus: Secondary | ICD-10-CM

## 2012-01-08 DIAGNOSIS — G459 Transient cerebral ischemic attack, unspecified: Secondary | ICD-10-CM

## 2012-01-08 LAB — CBC
MCHC: 32.9 g/dL (ref 30.0–36.0)
Platelets: 275 10*3/uL (ref 150–400)
RDW: 13.9 % (ref 11.5–15.5)
WBC: 8.9 10*3/uL (ref 4.0–10.5)

## 2012-01-08 LAB — DIFFERENTIAL
Basophils Absolute: 0 10*3/uL (ref 0.0–0.1)
Basophils Relative: 0 % (ref 0–1)
Eosinophils Relative: 1 % (ref 0–5)
Lymphocytes Relative: 25 % (ref 12–46)
Neutro Abs: 6.2 10*3/uL (ref 1.7–7.7)

## 2012-01-08 LAB — GLUCOSE, CAPILLARY
Glucose-Capillary: 157 mg/dL — ABNORMAL HIGH (ref 70–99)
Glucose-Capillary: 221 mg/dL — ABNORMAL HIGH (ref 70–99)
Glucose-Capillary: 203 mg/dL — ABNORMAL HIGH (ref 70–99)

## 2012-01-08 LAB — BASIC METABOLIC PANEL
CO2: 24 mEq/L (ref 19–32)
Calcium: 9.5 mg/dL (ref 8.4–10.5)
Chloride: 108 mEq/L (ref 96–112)
GFR calc Af Amer: >90 mL/min (ref >90)
Sodium: 141 mEq/L (ref 135–145)

## 2012-01-08 MED ORDER — MOXIFLOXACIN HCL 400 MG PO TABS
400.0000 mg | ORAL_TABLET | Freq: Every day | ORAL | Status: DC
  Administered 2012-01-08 – 2012-01-09 (×2): 400 mg via ORAL
  Filled 2012-01-08 (×2): qty 1

## 2012-01-08 NOTE — Progress Notes (Signed)
Occupational Therapy Evaluation Patient Details Name: Megan [name] MRN: [id number] DOB: [birth year] Today's Date: 01/08/2012 Time: 1191-4782 OT Time Calculation (min): 37 min  OT Assessment / Plan / Recommendation Clinical Impression  Pt presents with possible TIA or unknown seizure.  Pt also with R sided weakness from past CVA.  Per daughter report, pt is near baseline with BADLs but is slightly weaker possibly due to recent inactivity.  Will benefit from skilled OT to address below problem list in prep for d/c home with 24/7 assist from daughter and sitter.    OT Assessment  Patient needs continued OT Services    Follow Up Recommendations  Home health OT;Supervision/Assistance - 24 hour    Barriers to Discharge      Equipment Recommendations  None recommended by OT    Recommendations for Other Services    Frequency  Min 2X/week    Precautions / Restrictions Precautions Precautions: Fall Restrictions Weight Bearing Restrictions: No   Pertinent Vitals/Pain N/A    ADL  Lower Body Bathing: Performed;Moderate assistance Where Assessed - Lower Body Bathing: Supported sit to stand Upper Body Dressing: Performed;Moderate assistance Where Assessed - Upper Body Dressing: Unsupported sitting Toilet Transfer: Simulated;Maximal assistance Toilet Transfer Method: Stand pivot Toilet Transfer Equipment: Other (comment) (recliner) Equipment Used: Gait belt Transfers/Ambulation Related to ADLs: Nurse tech assist pt with front/back peri care while pt was already standing for transfer with therapist. Pt performed stand pivot with mod assist for stability and to prevent R knee buckling.  ADL Comments: Pt near baseline with BADLs per daughter report.    OT Diagnosis: Generalized weakness  OT Problem List: Decreased activity tolerance;Decreased strength OT Treatment Interventions: Self-care/ADL training;DME and/or AE instruction;Therapeutic activities;Patient/family education   OT Goals Acute  Rehab OT Goals OT Goal Formulation: With patient Time For Goal Achievement: 01/15/12 Potential to Achieve Goals: Good ADL Goals Pt Will Transfer to Toilet: with min assist;Stand pivot transfer;with DME;3-in-1;Other (comment) (caregiver independent in assisting) ADL Goal: Toilet Transfer - Progress: Goal set today Miscellaneous OT Goals Miscellaneous OT Goal #1: Pt will perform bed mobility with min assist with HOB flat in prep for OOB transfer. OT Goal: Miscellaneous Goal #1 - Progress: Goal set today  Visit Information  Last OT Received On: 01/08/12 Assistance Needed: +2 PT/OT Co-Evaluation/Treatment: Yes    Subjective Data      Prior Functioning  Home Living Lives With: Daughter Available Help at Discharge: Family;Personal care attendant (sitter during the day) Type of Home: House Home Access: Level entry Home Layout: One level Bathroom Shower/Tub: Other (comment) (sponge bathes) Bathroom Toilet: Standard Bathroom Accessibility: Yes How Accessible: Accessible via walker;Accessible via wheelchair Home Adaptive Equipment: Wheelchair - manual;Walker - rolling;Bedside commode/3-in-1 Prior Function Level of Independence: Needs assistance Needs Assistance: Bathing;Dressing;Gait;Transfers Bath: Moderate Dressing: Moderate Feeding: Supervision/set-up Toileting: Moderate Gait Assistance: uses RW, assistance during gait(min). WC most of the day Transfer Assistance: Min assist at times Able to Take Stairs?: No Driving: No Vocation: Retired Comments: rarely maneuvers the WC, assistance normally. Can use feet. Short distances with RW Communication Communication: Expressive difficulties;HOH Dominant Hand: Right (Uses left hand to feed due to premorbid stroke)    Cognition  Overall Cognitive Status: History of cognitive impairments - at baseline Arousal/Alertness: Awake/alert Orientation Level: Disoriented to;Time Behavior During Session: Restless    Extremity/Trunk  Assessment Right Upper Extremity Assessment RUE ROM/Strength/Tone: Deficits RUE ROM/Strength/Tone Deficits: 3-/5 MMT due to pre-morbid CVA.   Left Upper Extremity Assessment LUE ROM/Strength/Tone: Within functional levels LUE Sensation: WFL - Light Touch;WFL -  Proprioception LUE Coordination: WFL - gross/fine motor Right Lower Extremity Assessment RLE ROM/Strength/Tone: Deficits RLE ROM/Strength/Tone Deficits: difficult to determine as pt is [facility] and had difficulty following instructions due to that. MMT >/= 3/5 RLE Sensation: WFL - Light Touch Left Lower Extremity Assessment LLE ROM/Strength/Tone: Within functional levels LLE Sensation: WFL - Light Touch   Mobility Bed Mobility Bed Mobility: Supine to Sit;Sitting - Scoot to Edge of Bed Supine to Sit: 1: +2 Total assist;With rails;HOB elevated Supine to Sit: Patient Percentage: 60% Sitting - Scoot to Edge of Bed: 3: Mod assist Details for Bed Mobility Assistance: VC for sequencing. Assist with drawsheet and trunk mobility as pt with right lateral lean. Pt able to maintain balance once in sitting position with LEs on the floor Transfers Sit to Stand: 2: Max assist;With upper extremity assist;From bed Stand to Sit: 2: Max assist;With upper extremity assist;To chair/3-in-1 Details for Transfer Assistance: VC for sequencing and hand placement. Max assist for sit to and from stand for control of RLE buckling(minimal) as well as trunk stability. Cues throughout for upright posture. Assist to transfer from bed to chair, pt was able to take multiple steps without any RLE buckling   Exercise    Balance Balance Balance Assessed: Yes Static Sitting Balance Static Sitting - Balance Support: Bilateral upper extremity supported;Feet supported Static Sitting - Level of Assistance: 3: Mod assist;5: Stand by assistance Static Sitting - Comment/# of Minutes: Mod assist at first due to right lateral lean, although with increased time, pt was able to  maintain sitting balance for > 5 minutes with SBA only  End of Session OT - End of Session Equipment Utilized During Treatment: Gait belt Activity Tolerance: Patient tolerated treatment well Patient left: in chair;with call bell/phone within reach;with family/visitor present Nurse Communication: Mobility status  01/08/2012 Cipriano Mile OTR/L Pager [phone] Office [phone]  Cipriano Mile 01/08/2012, 1:55 PM

## 2012-01-08 NOTE — Evaluation (Signed)
Physical Therapy Evaluation Patient Details Name: Megan [name] MRN: [id number] DOB: October 12, 1921 Today's Date: 01/08/2012 Time:  -     PT Assessment / Plan / Recommendation Clinical Impression  Pt presents s/p possible TIA or unknown seizure. Pt with residual R sided weakness from previous stroke in July. According to daughter, pt is near baseline although may be slightly weaker due to inactivity. Pt will benefit from skilled PT in the acute care setting in order to maximize functional mobility for a safe d/c home with daughter and sitter.     PT Assessment  Patient needs continued PT services    Follow Up Recommendations  Home health PT;Supervision/Assistance - 24 hour    Barriers to Discharge        lEquipment Recommendations  None recommended by PT    Recommendations for Other Services     Frequency Min 4X/week    Precautions / Restrictions Precautions Precautions: Fall Restrictions Weight Bearing Restrictions: No         Mobility  Bed Mobility Bed Mobility: Supine to Sit;Sitting - Scoot to Edge of Bed Supine to Sit: 1: +2 Total assist;With rails;HOB elevated Supine to Sit: Patient Percentage: 60% Sitting - Scoot to Edge of Bed: 3: Mod assist Details for Bed Mobility Assistance: VC for sequencing. Assist with drawsheet and trunk mobility as pt with right lateral lean. Pt able to maintain balance once in sitting position with LEs on the floor Transfers Transfers: Sit to Stand;Stand to Sit;Stand Pivot Transfers Sit to Stand: 2: Max assist;With upper extremity assist;From bed Stand to Sit: 2: Max assist;With upper extremity assist;To chair/3-in-1 Stand Pivot Transfers: 3: Mod assist;With armrests Details for Transfer Assistance: VC for sequencing and hand placement. Max assist for sit to and from stand for control of RLE buckling(minimal) as well as trunk stability. Cues throughout for upright posture. Assist to transfer from bed to chair, pt was able to take multiple steps  without any RLE buckling Ambulation/Gait Ambulation/Gait Assistance: Not tested (comment) Modified Rankin (Stroke Patients Only) Pre-Morbid Rankin Score: Moderately severe disability Modified Rankin: Severe disability    Exercises     PT Diagnosis: Difficulty walking;Hemiplegia dominant side  PT Problem List: Decreased strength;Decreased activity tolerance;Decreased balance;Decreased mobility;Decreased knowledge of use of DME;Decreased safety awareness;Decreased knowledge of precautions PT Treatment Interventions: DME instruction;Gait training;Functional mobility training;Therapeutic activities;Therapeutic exercise;Balance training;Neuromuscular re-education;Patient/family education   PT Goals Acute Rehab PT Goals PT Goal Formulation: With patient/family Time For Goal Achievement: 01/15/12 Potential to Achieve Goals: Fair Pt will go Supine/Side to Sit: with modified independence PT Goal: Supine/Side to Sit - Progress: Goal set today Pt will go Sit to Supine/Side: with modified independence PT Goal: Sit to Supine/Side - Progress: Goal set today Pt will go Sit to Stand: with min assist PT Goal: Sit to Stand - Progress: Goal set today Pt will go Stand to Sit: with min assist PT Goal: Stand to Sit - Progress: Goal set today Pt will Transfer Bed to Chair/Chair to Bed: with min assist PT Transfer Goal: Bed to Chair/Chair to Bed - Progress: Goal set today Pt will Ambulate: with min assist;16 - 50 feet;with least restrictive assistive device PT Goal: Ambulate - Progress: Goal set today  Visit Information  Last PT Received On: 01/08/12 Assistance Needed: +2 PT/OT Co-Evaluation/Treatment: Yes    Subjective Data      Prior Functioning  Home Living Lives With: Daughter Available Help at Discharge: Family;Personal care attendant (sitter during the day) Type of Home: House Home Access: Level entry Home Layout:  One level Bathroom Shower/Tub: Other (comment) (sponge bathes) Bathroom  Toilet: Standard Bathroom Accessibility: Yes How Accessible: Accessible via walker;Accessible via wheelchair Home Adaptive Equipment: Wheelchair - manual;Walker - rolling;Bedside commode/3-in-1 Prior Function Level of Independence: Needs assistance Needs Assistance: Bathing;Dressing;Gait;Transfers Bath: Moderate Dressing: Moderate Feeding: Supervision/set-up Toileting: Moderate Gait Assistance: uses RW, assistance during gait(min). WC most of the day Transfer Assistance: Min assist at times Able to Take Stairs?: No Driving: No Vocation: Retired Comments: rarely maneuvers the WC, assistance normally. Can use feet. Short distances with RW Communication Communication: Expressive difficulties (slurred speech(baseline)) Dominant Hand: Right    Cognition  Overall Cognitive Status: History of cognitive impairments - at baseline Arousal/Alertness: Awake/alert    Extremity/Trunk Assessment Right Lower Extremity Assessment RLE ROM/Strength/Tone: Deficits RLE ROM/Strength/Tone Deficits: difficult to determine as pt is [facility] and had difficulty following instructions due to that. MMT >/= 3/5 RLE Sensation: WFL - Light Touch Left Lower Extremity Assessment LLE ROM/Strength/Tone: Within functional levels LLE Sensation: WFL - Light Touch   Balance Balance Balance Assessed: Yes Static Sitting Balance Static Sitting - Balance Support: Bilateral upper extremity supported;Feet supported Static Sitting - Level of Assistance: 3: Mod assist;5: Stand by assistance Static Sitting - Comment/# of Minutes: Mod assist at first due to right lateral lean, although with increased time, pt was able to maintain sitting balance for > 5 minutes with SBA only  End of Session PT - End of Session Equipment Utilized During Treatment: Gait belt Activity Tolerance: Patient tolerated treatment well Patient left: in chair;with call bell/phone within reach;with family/visitor present Nurse Communication: Mobility status     Milana Kidney 01/08/2012, 1:15 PM  01/08/2012 Milana Kidney DPT PAGER: [phone] OFFICE: [phone]

## 2012-01-08 NOTE — Procedures (Signed)
EEG NUMBER:  [id number].  This routine EEG was requested in this 78 year old female with a history of an ischemic stroke with resultant right-sided weakness.  She was admitted due to sudden onset of right upper body weakness and staring episodes.  MEDICATIONS:  Do not include any anticonvulsants.  The EEG was done with the patient awake, drowsy, and asleep.  During periods of maximal wakefulness, she had a 9 cycle per second posterior dominant rhythm that was not well seen over the left temporal region, was not clearly reactive to eye opening.  Over the left temporal region, there seemed to be paucity of not only the posterior dominant rhythm, but an increase in the amount of lower amplitude arrhythmic delta activities.  Photic stimulation produced a minimal driving response was well seen over the left temporal region.  Hyperventilation was not performed.  Much of the tracing was spent in a drowsy and asleep state.  This was characterized by the emergence of semirhythmic frontally dominant delta activities.  There were sleep spindles that were noted to be better seen over the left paramedian region, midline, and right hemisphere.  CLINICAL INTERPRETATION:  This routine EEG done with the patient awake, drowsy, and asleep is abnormal.  The paucity of faster activities over the left temporal region, both during wakefulness and sleep, as well as increased amount of arrhythmic delta activities over the same region suggesting an underlying structural abnormality in that location.  No interictal epileptiform discharges or electrographic seizures were seen.          ______________________________ Denton Meek, MD    [id number] D:  01/08/2012 00:24:34  T:  01/08/2012 00:44:06  Job #:  [id number]

## 2012-01-08 NOTE — Progress Notes (Signed)
Subjective: No complaints. No further spells.   Objective: Current vital signs: BP 119/53  Pulse 89  Temp(Src) 97.9 F (36.6 C) (Oral)  Resp 18  Ht 5\' 2"  (1.575 m)  Wt 58.7 kg (129 lb 6.6 oz)  BMI 23.67 kg/m2  SpO2 100% Vital signs in last 24 hours: Temp:  [97.8 F (36.6 C)-98.2 F (36.8 C)] 97.9 F (36.6 C) (05/18 1006) Pulse Rate:  [83-90] 89  (05/18 1006) Resp:  [17-20] 18  (05/18 1006) BP: (93-127)/(36-53) 119/53 mmHg (05/18 1006) SpO2:  [95 %-100 %] 100 % (05/18 1006)  Intake/Output from previous day: 05/17 0701 - 05/18 0700 In: -  Out: 1300 [Urine:1300] Intake/Output this shift: Total I/O In: 200 [P.O.:200] Out: 250 [Urine:250] Nutritional status: Cardiac  Neurologic Exam: Ment: Alert. Poorly oriented.  CN: Right facial droop.  Motor: 4/5 right upper and lower extremity. 5/5 left upper and lower extremity. Other: No adventitious movements, limb jerking or eyelid fluttering noted during examination. No episodes of speech arrest.   Lab Results: Results for orders placed during the hospital encounter of 01/06/12 (from the past 48 hour(s))  PROTIME-INR     Status: Normal   Collection Time   01/06/12  8:52 PM      Component Value Range Comment   Prothrombin Time 14.0  11.6 - 15.2 (seconds)    INR 1.06  0.00 - 1.49    APTT     Status: Normal   Collection Time   01/06/12  8:52 PM      Component Value Range Comment   aPTT 28  24 - 37 (seconds)   CBC     Status: Abnormal   Collection Time   01/06/12  8:52 PM      Component Value Range Comment   WBC 18.6 (*) 4.0 - 10.5 (K/uL)    RBC 3.37 (*) 3.87 - 5.11 (MIL/uL)    Hemoglobin 10.5 (*) 12.0 - 15.0 (g/dL)    HCT 16.1 (*) 09.6 - 46.0 (%)    MCV 94.7  78.0 - 100.0 (fL)    MCH 31.2  26.0 - 34.0 (pg)    MCHC 32.9  30.0 - 36.0 (g/dL)    RDW 04.5  40.9 - 81.1 (%)    Platelets 323  150 - 400 (K/uL)   DIFFERENTIAL     Status: Abnormal   Collection Time   01/06/12  8:52 PM      Component Value Range Comment   Neutrophils Relative 77  43 - 77 (%)    Neutro Abs 14.2 (*) 1.7 - 7.7 (K/uL)    Lymphocytes Relative 17  12 - 46 (%)    Lymphs Abs 3.1  0.7 - 4.0 (K/uL)    Monocytes Relative 7  3 - 12 (%)    Monocytes Absolute 1.2 (*) 0.1 - 1.0 (K/uL)    Eosinophils Relative 0  0 - 5 (%)    Eosinophils Absolute 0.0  0.0 - 0.7 (K/uL)    Basophils Relative 0  0 - 1 (%)    Basophils Absolute 0.0  0.0 - 0.1 (K/uL)   COMPREHENSIVE METABOLIC PANEL     Status: Abnormal   Collection Time   01/06/12  8:52 PM      Component Value Range Comment   Sodium 134 (*) 135 - 145 (mEq/L)    Potassium 4.1  3.5 - 5.1 (mEq/L)    Chloride 97  96 - 112 (mEq/L)    CO2 25  19 - 32 (mEq/L)  Glucose, Bld 167 (*) 70 - 99 (mg/dL)    BUN 11  6 - 23 (mg/dL)    Creatinine, Ser 6.96  0.50 - 1.10 (mg/dL)    Calcium 29.5  8.4 - 10.5 (mg/dL)    Total Protein 7.5  6.0 - 8.3 (g/dL)    Albumin 3.6  3.5 - 5.2 (g/dL)    AST 14  0 - 37 (U/L) HEMOLYSIS AT THIS LEVEL MAY AFFECT RESULT   ALT 8  0 - 35 (U/L)    Alkaline Phosphatase 67  39 - 117 (U/L)    Total Bilirubin 0.2 (*) 0.3 - 1.2 (mg/dL)    GFR calc non Af Amer 77 (*) >90 (mL/min)    GFR calc Af Amer 89 (*) >90 (mL/min)   CK TOTAL AND CKMB     Status: Normal   Collection Time   01/06/12  8:52 PM      Component Value Range Comment   Total CK 40  7 - 177 (U/L)    CK, MB 1.7  0.3 - 4.0 (ng/mL)    Relative Index RELATIVE INDEX IS INVALID  0.0 - 2.5    TROPONIN I     Status: Normal   Collection Time   01/06/12  8:52 PM      Component Value Range Comment   Troponin I <0.30  <0.30 (ng/mL)   POCT I-STAT, CHEM 8     Status: Abnormal   Collection Time   01/06/12  9:06 PM      Component Value Range Comment   Sodium 139  135 - 145 (mEq/L)    Potassium 4.0  3.5 - 5.1 (mEq/L)    Chloride 103  96 - 112 (mEq/L)    BUN 11  6 - 23 (mg/dL)    Creatinine, Ser 2.84  0.50 - 1.10 (mg/dL)    Glucose, Bld 132 (*) 70 - 99 (mg/dL)    Calcium, Ion 4.40 (*) 1.12 - 1.32 (mmol/L)    TCO2 24  0 - 100  (mmol/L)    Hemoglobin 10.9 (*) 12.0 - 15.0 (g/dL)    HCT 10.2 (*) 72.5 - 46.0 (%)   GLUCOSE, CAPILLARY     Status: Abnormal   Collection Time   01/06/12  9:29 PM      Component Value Range Comment   Glucose-Capillary 174 (*) 70 - 99 (mg/dL)    Comment 1 Documented in Chart      Comment 2 Notify RN     URINE RAPID DRUG SCREEN (HOSP PERFORMED)     Status: Normal   Collection Time   01/06/12 11:06 PM      Component Value Range Comment   Opiates NONE DETECTED  NONE DETECTED     Cocaine NONE DETECTED  NONE DETECTED     Benzodiazepines NONE DETECTED  NONE DETECTED     Amphetamines NONE DETECTED  NONE DETECTED     Tetrahydrocannabinol NONE DETECTED  NONE DETECTED     Barbiturates NONE DETECTED  NONE DETECTED    URINALYSIS, ROUTINE W REFLEX MICROSCOPIC     Status: Abnormal   Collection Time   01/06/12 11:06 PM      Component Value Range Comment   Color, Urine YELLOW  YELLOW     APPearance CLEAR  CLEAR     Specific Gravity, Urine 1.015  1.005 - 1.030     pH 6.5  5.0 - 8.0     Glucose, UA 100 (*) NEGATIVE (mg/dL)    Hgb urine dipstick  NEGATIVE  NEGATIVE     Bilirubin Urine NEGATIVE  NEGATIVE     Ketones, ur NEGATIVE  NEGATIVE (mg/dL)    Protein, ur NEGATIVE  NEGATIVE (mg/dL)    Urobilinogen, UA 0.2  0.0 - 1.0 (mg/dL)    Nitrite NEGATIVE  NEGATIVE     Leukocytes, UA NEGATIVE  NEGATIVE  MICROSCOPIC NOT DONE ON URINES WITH NEGATIVE PROTEIN, BLOOD, LEUKOCYTES, NITRITE, OR GLUCOSE <1000 mg/dL.  TSH     Status: Normal   Collection Time   01/07/12 12:55 AM      Component Value Range Comment   TSH 0.370  0.350 - 4.500 (uIU/mL)   HEMOGLOBIN A1C     Status: Abnormal   Collection Time   01/07/12 12:55 AM      Component Value Range Comment   Hemoglobin A1C 5.9 (*) <5.7 (%)    Mean Plasma Glucose 123 (*) <117 (mg/dL)   CBC     Status: Abnormal   Collection Time   01/07/12 12:55 AM      Component Value Range Comment   WBC 16.9 (*) 4.0 - 10.5 (K/uL)    RBC 2.99 (*) 3.87 - 5.11 (MIL/uL)     Hemoglobin 9.1 (*) 12.0 - 15.0 (g/dL)    HCT 16.1 (*) 09.6 - 46.0 (%)    MCV 93.0  78.0 - 100.0 (fL)    MCH 30.4  26.0 - 34.0 (pg)    MCHC 32.7  30.0 - 36.0 (g/dL)    RDW 04.5  40.9 - 81.1 (%)    Platelets 267  150 - 400 (K/uL)   CREATININE, SERUM     Status: Abnormal   Collection Time   01/07/12 12:55 AM      Component Value Range Comment   Creatinine, Ser 0.66  0.50 - 1.10 (mg/dL)    GFR calc non Af Amer 77 (*) >90 (mL/min)    GFR calc Af Amer 89 (*) >90 (mL/min)   GLUCOSE, CAPILLARY     Status: Abnormal   Collection Time   01/07/12  1:04 AM      Component Value Range Comment   Glucose-Capillary 146 (*) 70 - 99 (mg/dL)    Comment 1 Notify RN      Comment 2 Documented in Chart     GLUCOSE, CAPILLARY     Status: Normal   Collection Time   01/07/12  6:58 AM      Component Value Range Comment   Glucose-Capillary 97  70 - 99 (mg/dL)    Comment 1 Documented in Chart      Comment 2 Notify RN     COMPREHENSIVE METABOLIC PANEL     Status: Abnormal   Collection Time   01/07/12  8:56 AM      Component Value Range Comment   Sodium 139  135 - 145 (mEq/L)    Potassium 4.2  3.5 - 5.1 (mEq/L)    Chloride 103  96 - 112 (mEq/L)    CO2 25  19 - 32 (mEq/L)    Glucose, Bld 110 (*) 70 - 99 (mg/dL)    BUN 12  6 - 23 (mg/dL)    Creatinine, Ser 9.14  0.50 - 1.10 (mg/dL)    Calcium 9.9  8.4 - 10.5 (mg/dL)    Total Protein 6.4  6.0 - 8.3 (g/dL)    Albumin 2.9 (*) 3.5 - 5.2 (g/dL)    AST 12  0 - 37 (U/L)    ALT 8  0 - 35 (U/L)  Alkaline Phosphatase 70  39 - 117 (U/L)    Total Bilirubin 0.2 (*) 0.3 - 1.2 (mg/dL)    GFR calc non Af Amer 78 (*) >90 (mL/min)    GFR calc Af Amer >90  >90 (mL/min)   CBC     Status: Abnormal   Collection Time   01/07/12  8:56 AM      Component Value Range Comment   WBC 18.4 (*) 4.0 - 10.5 (K/uL)    RBC 3.18 (*) 3.87 - 5.11 (MIL/uL)    Hemoglobin 9.7 (*) 12.0 - 15.0 (g/dL)    HCT 16.1 (*) 09.6 - 46.0 (%)    MCV 93.7  78.0 - 100.0 (fL)    MCH 30.5  26.0 - 34.0  (pg)    MCHC 32.6  30.0 - 36.0 (g/dL)    RDW 04.5  40.9 - 81.1 (%)    Platelets 253  150 - 400 (K/uL)   LIPID PANEL     Status: Normal   Collection Time   01/07/12  8:56 AM      Component Value Range Comment   Cholesterol 89  0 - 200 (mg/dL)    Triglycerides 53  <914 (mg/dL)    HDL 43  >78 (mg/dL)    Total CHOL/HDL Ratio 2.1      VLDL 11  0 - 40 (mg/dL)    LDL Cholesterol 35  0 - 99 (mg/dL)   GLUCOSE, CAPILLARY     Status: Abnormal   Collection Time   01/07/12  4:39 PM      Component Value Range Comment   Glucose-Capillary 146 (*) 70 - 99 (mg/dL)   GLUCOSE, CAPILLARY     Status: Abnormal   Collection Time   01/07/12 10:26 PM      Component Value Range Comment   Glucose-Capillary 157 (*) 70 - 99 (mg/dL)    Comment 1 Documented in Chart      Comment 2 Notify RN     CBC     Status: Abnormal   Collection Time   01/08/12  5:35 AM      Component Value Range Comment   WBC 8.9  4.0 - 10.5 (K/uL)    RBC 3.01 (*) 3.87 - 5.11 (MIL/uL)    Hemoglobin 9.3 (*) 12.0 - 15.0 (g/dL)    HCT 29.5 (*) 62.1 - 46.0 (%)    MCV 94.0  78.0 - 100.0 (fL)    MCH 30.9  26.0 - 34.0 (pg)    MCHC 32.9  30.0 - 36.0 (g/dL)    RDW 30.8  65.7 - 84.6 (%)    Platelets 275  150 - 400 (K/uL)   DIFFERENTIAL     Status: Normal   Collection Time   01/08/12  5:35 AM      Component Value Range Comment   Neutrophils Relative 69  43 - 77 (%)    Neutro Abs 6.2  1.7 - 7.7 (K/uL)    Lymphocytes Relative 25  12 - 46 (%)    Lymphs Abs 2.2  0.7 - 4.0 (K/uL)    Monocytes Relative 6  3 - 12 (%)    Monocytes Absolute 0.5  0.1 - 1.0 (K/uL)    Eosinophils Relative 1  0 - 5 (%)    Eosinophils Absolute 0.1  0.0 - 0.7 (K/uL)    Basophils Relative 0  0 - 1 (%)    Basophils Absolute 0.0  0.0 - 0.1 (K/uL)   BASIC METABOLIC PANEL  Status: Abnormal   Collection Time   01/08/12  5:35 AM      Component Value Range Comment   Sodium 141  135 - 145 (mEq/L)    Potassium 3.6  3.5 - 5.1 (mEq/L)    Chloride 108  96 - 112 (mEq/L)    CO2  24  19 - 32 (mEq/L)    Glucose, Bld 122 (*) 70 - 99 (mg/dL)    BUN 9  6 - 23 (mg/dL)    Creatinine, Ser 4.13  0.50 - 1.10 (mg/dL)    Calcium 9.5  8.4 - 10.5 (mg/dL)    GFR calc non Af Amer 81 (*) >90 (mL/min)    GFR calc Af Amer >90  >90 (mL/min)   GLUCOSE, CAPILLARY     Status: Abnormal   Collection Time   01/08/12  7:29 AM      Component Value Range Comment   Glucose-Capillary 109 (*) 70 - 99 (mg/dL)    Comment 1 Documented in Chart      Comment 2 Notify RN       No results found for this or any previous visit (from the past 240 hour(s)).  Lipid Panel  Basename 01/07/12 0856  CHOL 89  TRIG 53  HDL 43  CHOLHDL 2.1  VLDL 11  LDLCALC 35    Studies/Results: Ct Head Wo Contrast  01/06/2012  *RADIOLOGY REPORT*  Clinical Data: Code stroke, right-sided weakness.  CT HEAD WITHOUT CONTRAST  Technique:  Contiguous axial images were obtained from the base of the skull through the vertex without contrast.  Comparison: MRI head 09/02/2011.  Most recent CT head 03/13/2011.  Findings: There is no evidence for acute infarction, intracranial hemorrhage, mass lesion, hydrocephalus, or extra-axial fluid. Moderate atrophy is present with moderate to severe chronic microvascular ischemic change.  Calvarium intact.  Clear sinuses and mastoids.  Vascular calcification.  Similar appearance to priors.  IMPRESSION: Atrophy and chronic microvascular ischemic change.  No visible acute stroke or bleed.  Critical Value/emergent results were called by telephone at the time of interpretation on  01/06/2012  at 9:07 p.m.  to  Dr. Preston Fleeting, who verbally acknowledged these results.  Original Report Authenticated By: Elsie Stain, M.D.   Mri Brain Without Contrast  01/07/2012  *RADIOLOGY REPORT*  Clinical Data:  TIA  MRI HEAD WITHOUT CONTRAST MRA HEAD WITHOUT CONTRAST  Technique:  Multiplanar, multiecho pulse sequences of the brain and surrounding structures were obtained without intravenous contrast. Angiographic  images of the head were obtained using MRA technique without contrast.  Comparison:  MRI 09/02/2011  MRI HEAD  Findings:  Negative for acute infarct.  Moderate atrophy.  Chronic microvascular ischemic changes throughout the white matter.  Chronic infarcts in the basal ganglia and thalami bilaterally.  Extensive chronic ischemia in the pons especially on the left which is unchanged from the prior study. Chronic area of hemorrhage in the right pons and central pons are unchanged.  Negative for mass lesion.  Some chronic sinusitis.  IMPRESSION: Atrophy and extensive chronic ischemic changes.  No acute infarct.  MRA HEAD  Findings: Distal right vertebral artery is not visualized and may be hypoplastic or occluded.  Left vertebral artery and basilar are widely patent.  Stenosis of the proximal AICA bilaterally.  Left PICA is patent.  Right AICA not visualized.  Superior cerebellar and posterior cerebral arteries are patent bilaterally.  Mild narrowing of the posterior cerebral artery bilaterally.  Internal carotid artery is patent bilaterally with mild atherosclerotic irregularity in  the cavernous segment bilaterally. Diffuse intracranial atherosclerotic disease is seen with multiple areas of stenosis throughout the anterior and middle cerebral artery branches.  No large vessel occlusion or aneurysm.  IMPRESSION: Moderately severe intracranial atherosclerotic disease.  Right vertebral artery not visualized.  Original Report Authenticated By: Camelia Phenes, M.D.   Dg Chest Portable 1 View  01/06/2012  *RADIOLOGY REPORT*  Clinical Data: Cough, vomiting, shortness of breath.  Code stroke.  PORTABLE CHEST - 1 VIEW  Comparison: None.  Findings: Shallow inspiration.  Mild cardiac enlargement with normal pulmonary vascularity given the technique.  There is evidence of infiltration or atelectasis in the left lung base. Changes could represent pneumonia.  No blunting of costophrenic angles.  No pneumothorax.  Calcification of  the aorta. Degenerative changes in the spine and shoulders.  IMPRESSION: Shallow inspiration with infiltration or atelectasis in the left lung base.  Original Report Authenticated By: Marlon Pel, M.D.   Mr Mra Head/brain Wo Cm  01/07/2012  *RADIOLOGY REPORT*  Clinical Data:  TIA  MRI HEAD WITHOUT CONTRAST MRA HEAD WITHOUT CONTRAST  Technique:  Multiplanar, multiecho pulse sequences of the brain and surrounding structures were obtained without intravenous contrast. Angiographic images of the head were obtained using MRA technique without contrast.  Comparison:  MRI 09/02/2011  MRI HEAD  Findings:  Negative for acute infarct.  Moderate atrophy.  Chronic microvascular ischemic changes throughout the white matter.  Chronic infarcts in the basal ganglia and thalami bilaterally.  Extensive chronic ischemia in the pons especially on the left which is unchanged from the prior study. Chronic area of hemorrhage in the right pons and central pons are unchanged.  Negative for mass lesion.  Some chronic sinusitis.  IMPRESSION: Atrophy and extensive chronic ischemic changes.  No acute infarct.  MRA HEAD  Findings: Distal right vertebral artery is not visualized and may be hypoplastic or occluded.  Left vertebral artery and basilar are widely patent.  Stenosis of the proximal AICA bilaterally.  Left PICA is patent.  Right AICA not visualized.  Superior cerebellar and posterior cerebral arteries are patent bilaterally.  Mild narrowing of the posterior cerebral artery bilaterally.  Internal carotid artery is patent bilaterally with mild atherosclerotic irregularity in the cavernous segment bilaterally. Diffuse intracranial atherosclerotic disease is seen with multiple areas of stenosis throughout the anterior and middle cerebral artery branches.  No large vessel occlusion or aneurysm.  IMPRESSION: Moderately severe intracranial atherosclerotic disease.  Right vertebral artery not visualized.  Original Report Authenticated  By: Camelia Phenes, M.D.    Medications:  Scheduled:   . atorvastatin  10 mg Oral q1800  . dipyridamole-aspirin  1 capsule Oral BID  . enoxaparin  40 mg Subcutaneous Q24H  . insulin aspart  0-9 Units Subcutaneous TID WC  . levETIRAcetam  500 mg Oral BID  . moxifloxacin  400 mg Intravenous Q24H  . pantoprazole  40 mg Oral QPM  . white petrolatum        Assessment/Plan:  1. Probable new onset of partial complex seizure disorder. Patient doing well on Keppra. Continue at current dose. EEG was abnormal, revealing a paucity of faster activities over the left temporal region, as well as increased amount of arrhythmic delta activities over the same region, suggesting an underlying structural abnormality in that location. No  interictal epileptiform discharges or electrographic seizures were seen. 2. History of prior left pontine stroke with right hemiparesis. Continue current secondary stroke prevention regimen.    LOS: 2 days   @Electronically  signed:  Dr. Caryl Pina 01/08/2012  11:15 AM

## 2012-01-08 NOTE — Progress Notes (Signed)
Speech Language Pathology Dysphagia Treatment Patient Details Name: Megan [name] MRN: [id number] DOB: 21-Apr-1922 Today's Date: 01/08/2012 Time: 7829-5621 SLP Time Calculation (min): 40 min  Assessment / Plan / Recommendation Clinical Impression  Diagnostic tx for diet tolerance of regular consistency and thin liquids following initial BSE completed on 01/07/12. Patient observed at noon meal with her daugher (caregiver) present  in room.  No +s/s of aspiration observed throughout meal but daughter stated that patient was unable to eat lettuce salad  day prior and had to order soup. Mastication noted to be extended with regular solids but effective.  Recommend to continue curent diet  . ST to f/u 1x for diet tolerance and d/c. No futher  ST inidcated at next level of care .     Diet Recommendation  Continue with Current Diet: Regular;Thin liquid    SLP Plan Continue with current plan of care      Swallowing Goals  SLP Swallowing Goals Swallow Study Goal #1 - Progress: Progressing toward goal Swallow Study Goal #2 - Progress: Progressing toward goal  General Temperature Spikes Noted: No Respiratory Status: Room air Behavior/Cognition: Alert;Cooperative;Hard of hearing Oral Cavity - Dentition: Dentures, top;Dentures, bottom Patient Positioning: Upright in chair       Dysphagia Treatment Treatment focused on: Skilled observation of diet tolerance Treatment Methods/Modalities: Skilled observation;Differential diagnosis Patient observed directly with PO's: Yes Type of PO's observed: Regular;Thin liquids Feeding: Able to feed self Liquids provided via: Cup Oral Phase Signs & Symptoms: Prolonged mastication;Prolonged bolus formation Moreen Fowler MS, CCC-SLP [phone]   [facility] 01/08/2012, 2:51 PM

## 2012-01-08 NOTE — Progress Notes (Signed)
Subjective: She is alert very hard of hearing daughter states back to baseline. Now minimal cough.  Objective: Vital signs in last 24 hours: Temp:  [97.8 F (36.6 C)-98.2 F (36.8 C)] 97.9 F (36.6 C) (05/18 1006) Pulse Rate:  [83-90] 89  (05/18 1006) Resp:  [17-20] 18  (05/18 1006) BP: (93-127)/(36-53) 119/53 mmHg (05/18 1006) SpO2:  [95 %-100 %] 100 % (05/18 1006) Weight change:  Last BM Date: 01/06/12  Intake/Output from previous day: 05/17 0701 - 05/18 0700 In: -  Out: 1300 [Urine:1300] Intake/Output this shift: Total I/O In: 200 [P.O.:200] Out: 250 [Urine:250]  Resp: clear to auscultation bilaterally Cardio: regular rate and rhythm, S1, S2 normal, no murmur, click, rub or gallop GI: soft, non-tender; bowel sounds normal; no masses,  no organomegaly Extremities: edema trace lower  extremity edema  Lab Results:  [facility] 01/08/12 0535 01/07/12 0856  WBC 8.9 18.4*  HGB 9.3* 9.7*  HCT 28.3* 29.8*  PLT 275 253   BMET  Basename 01/08/12 0535 01/07/12 0856  NA 141 139  K 3.6 4.2  CL 108 103  CO2 24 25  GLUCOSE 122* 110*  BUN 9 12  CREATININE 0.56 0.63  CALCIUM 9.5 9.9    Studies/Results: Ct Head Wo Contrast  01/06/2012  *RADIOLOGY REPORT*  Clinical Data: Code stroke, right-sided weakness.  CT HEAD WITHOUT CONTRAST  Technique:  Contiguous axial images were obtained from the base of the skull through the vertex without contrast.  Comparison: MRI head 09/02/2011.  Most recent CT head 03/13/2011.  Findings: There is no evidence for acute infarction, intracranial hemorrhage, mass lesion, hydrocephalus, or extra-axial fluid. Moderate atrophy is present with moderate to severe chronic microvascular ischemic change.  Calvarium intact.  Clear sinuses and mastoids.  Vascular calcification.  Similar appearance to priors.  IMPRESSION: Atrophy and chronic microvascular ischemic change.  No visible acute stroke or bleed.  Critical Value/emergent results were called by telephone  at the time of interpretation on  01/06/2012  at 9:07 p.m.  to  Dr. Preston Fleeting, who verbally acknowledged these results.  Original Report Authenticated By: Elsie Stain, M.D.   Mri Brain Without Contrast  01/07/2012  *RADIOLOGY REPORT*  Clinical Data:  TIA  MRI HEAD WITHOUT CONTRAST MRA HEAD WITHOUT CONTRAST  Technique:  Multiplanar, multiecho pulse sequences of the brain and surrounding structures were obtained without intravenous contrast. Angiographic images of the head were obtained using MRA technique without contrast.  Comparison:  MRI 09/02/2011  MRI HEAD  Findings:  Negative for acute infarct.  Moderate atrophy.  Chronic microvascular ischemic changes throughout the white matter.  Chronic infarcts in the basal ganglia and thalami bilaterally.  Extensive chronic ischemia in the pons especially on the left which is unchanged from the prior study. Chronic area of hemorrhage in the right pons and central pons are unchanged.  Negative for mass lesion.  Some chronic sinusitis.  IMPRESSION: Atrophy and extensive chronic ischemic changes.  No acute infarct.  MRA HEAD  Findings: Distal right vertebral artery is not visualized and may be hypoplastic or occluded.  Left vertebral artery and basilar are widely patent.  Stenosis of the proximal AICA bilaterally.  Left PICA is patent.  Right AICA not visualized.  Superior cerebellar and posterior cerebral arteries are patent bilaterally.  Mild narrowing of the posterior cerebral artery bilaterally.  Internal carotid artery is patent bilaterally with mild atherosclerotic irregularity in the cavernous segment bilaterally. Diffuse intracranial atherosclerotic disease is seen with multiple areas of stenosis throughout the anterior and  middle cerebral artery branches.  No large vessel occlusion or aneurysm.  IMPRESSION: Moderately severe intracranial atherosclerotic disease.  Right vertebral artery not visualized.  Original Report Authenticated By: Camelia Phenes, M.D.   Dg  Chest Portable 1 View  01/06/2012  *RADIOLOGY REPORT*  Clinical Data: Cough, vomiting, shortness of breath.  Code stroke.  PORTABLE CHEST - 1 VIEW  Comparison: None.  Findings: Shallow inspiration.  Mild cardiac enlargement with normal pulmonary vascularity given the technique.  There is evidence of infiltration or atelectasis in the left lung base. Changes could represent pneumonia.  No blunting of costophrenic angles.  No pneumothorax.  Calcification of the aorta. Degenerative changes in the spine and shoulders.  IMPRESSION: Shallow inspiration with infiltration or atelectasis in the left lung base.  Original Report Authenticated By: Marlon Pel, M.D.   Mr Mra Head/brain Wo Cm  01/07/2012  *RADIOLOGY REPORT*  Clinical Data:  TIA  MRI HEAD WITHOUT CONTRAST MRA HEAD WITHOUT CONTRAST  Technique:  Multiplanar, multiecho pulse sequences of the brain and surrounding structures were obtained without intravenous contrast. Angiographic images of the head were obtained using MRA technique without contrast.  Comparison:  MRI 09/02/2011  MRI HEAD  Findings:  Negative for acute infarct.  Moderate atrophy.  Chronic microvascular ischemic changes throughout the white matter.  Chronic infarcts in the basal ganglia and thalami bilaterally.  Extensive chronic ischemia in the pons especially on the left which is unchanged from the prior study. Chronic area of hemorrhage in the right pons and central pons are unchanged.  Negative for mass lesion.  Some chronic sinusitis.  IMPRESSION: Atrophy and extensive chronic ischemic changes.  No acute infarct.  MRA HEAD  Findings: Distal right vertebral artery is not visualized and may be hypoplastic or occluded.  Left vertebral artery and basilar are widely patent.  Stenosis of the proximal AICA bilaterally.  Left PICA is patent.  Right AICA not visualized.  Superior cerebellar and posterior cerebral arteries are patent bilaterally.  Mild narrowing of the posterior cerebral artery  bilaterally.  Internal carotid artery is patent bilaterally with mild atherosclerotic irregularity in the cavernous segment bilaterally. Diffuse intracranial atherosclerotic disease is seen with multiple areas of stenosis throughout the anterior and middle cerebral artery branches.  No large vessel occlusion or aneurysm.  IMPRESSION: Moderately severe intracranial atherosclerotic disease.  Right vertebral artery not visualized.  Original Report Authenticated By: Camelia Phenes, M.D.    Medications:  Scheduled:   . atorvastatin  10 mg Oral q1800  . dipyridamole-aspirin  1 capsule Oral BID  . enoxaparin  40 mg Subcutaneous Q24H  . insulin aspart  0-9 Units Subcutaneous TID WC  . levETIRAcetam  500 mg Oral BID  . moxifloxacin  400 mg Intravenous Q24H  . pantoprazole  40 mg Oral QPM  . white petrolatum        Assessment/Plan: 1. Stroke vs tia now back to baseline. Mri reveals no acute stroke. Appreciate neurology evaluation.  Keppra started as this may have been a seizure. 2. Exam consistent with mild volume overload.  I will change iv to saline lock, she is eating well 3. Anemia stable evaluation per Dr Valentina Lucks 4. Pneumonia doing well on antibiotic wbc down ? If was related to seizure  LOS: 2 days   Megan [name] 01/08/2012, 11:33 AM

## 2012-01-09 ENCOUNTER — Encounter (HOSPITAL_COMMUNITY): Payer: Self-pay | Admitting: Geriatric Medicine

## 2012-01-09 DIAGNOSIS — Z8669 Personal history of other diseases of the nervous system and sense organs: Secondary | ICD-10-CM | POA: Insufficient documentation

## 2012-01-09 LAB — GLUCOSE, CAPILLARY
Glucose-Capillary: 177 mg/dL — ABNORMAL HIGH (ref 70–99)
Glucose-Capillary: 209 mg/dL — ABNORMAL HIGH (ref 70–99)

## 2012-01-09 MED ORDER — ATORVASTATIN CALCIUM 10 MG PO TABS
10.0000 mg | ORAL_TABLET | ORAL | Status: DC
  Filled 2012-01-09: qty 1

## 2012-01-09 MED ORDER — METFORMIN HCL 500 MG PO TABS
1000.0000 mg | ORAL_TABLET | Freq: Two times a day (BID) | ORAL | Status: DC
  Administered 2012-01-09 – 2012-01-10 (×2): 1000 mg via ORAL
  Filled 2012-01-09 (×5): qty 2

## 2012-01-09 MED ORDER — ASPIRIN-DIPYRIDAMOLE ER 25-200 MG PO CP12
1.0000 | ORAL_CAPSULE | Freq: Two times a day (BID) | ORAL | Status: DC
  Administered 2012-01-10: 1 via ORAL
  Filled 2012-01-09 (×3): qty 1

## 2012-01-09 MED ORDER — INSULIN GLARGINE 100 UNIT/ML ~~LOC~~ SOLN
15.0000 [IU] | Freq: Every day | SUBCUTANEOUS | Status: DC
  Administered 2012-01-09: 15 [IU] via SUBCUTANEOUS

## 2012-01-09 MED ORDER — AMLODIPINE BESY-BENAZEPRIL HCL 5-10 MG PO CAPS: 1.0000 | ORAL_CAPSULE | Freq: Every day | ORAL | Status: DC

## 2012-01-09 MED ORDER — AMLODIPINE BESYLATE 5 MG PO TABS
5.0000 mg | ORAL_TABLET | Freq: Every day | ORAL | Status: DC
  Filled 2012-01-09 (×2): qty 1

## 2012-01-09 MED ORDER — MOXIFLOXACIN HCL 400 MG PO TABS
400.0000 mg | ORAL_TABLET | ORAL | Status: DC
  Filled 2012-01-09: qty 1

## 2012-01-09 MED ORDER — LEVETIRACETAM 500 MG PO TABS
500.0000 mg | ORAL_TABLET | Freq: Two times a day (BID) | ORAL | Status: DC
  Administered 2012-01-10: 500 mg via ORAL
  Filled 2012-01-09 (×3): qty 1

## 2012-01-09 MED ORDER — BENAZEPRIL HCL 10 MG PO TABS
10.0000 mg | ORAL_TABLET | Freq: Every day | ORAL | Status: DC
  Filled 2012-01-09 (×2): qty 1

## 2012-01-09 MED ORDER — ROSUVASTATIN CALCIUM 5 MG PO TABS
5.0000 mg | ORAL_TABLET | ORAL | Status: DC
  Filled 2012-01-09: qty 1

## 2012-01-09 NOTE — Progress Notes (Signed)
Subjective: No further episodes of right hand weakness  Objective: Vital signs in last 24 hours: Temp:  [97.6 F (36.4 C)-98.2 F (36.8 C)] 98.2 F (36.8 C) (05/19 0556) Pulse Rate:  [85-98] 88  (05/19 0556) Resp:  [18-20] 20  (05/19 0556) BP: (108-128)/(49-68) 128/68 mmHg (05/19 0556) SpO2:  [98 %-100 %] 100 % (05/19 0556) Weight change:  Last BM Date: 01/08/12  Intake/Output from previous day: 05/18 0701 - 05/19 0700 In: 720 [P.O.:720] Out: 650 [Urine:650] Intake/Output this shift: Total I/O In: 200 [P.O.:200] Out: 100 [Urine:100]  Resp: clear to auscultation bilaterally Cardio: regular rate and rhythm, S1, S2 normal, no murmur, click, rub or gallop  Lab Results:  Basename 01/08/12 0535 01/07/12 0856  WBC 8.9 18.4*  HGB 9.3* 9.7*  HCT 28.3* 29.8*  PLT 275 253   BMET  Basename 01/08/12 0535 01/07/12 0856  NA 141 139  K 3.6 4.2  CL 108 103  CO2 24 25  GLUCOSE 122* 110*  BUN 9 12  CREATININE 0.56 0.63  CALCIUM 9.5 9.9    Studies/Results: Mri Brain Without Contrast  01/07/2012  *RADIOLOGY REPORT*  Clinical Data:  TIA  MRI HEAD WITHOUT CONTRAST MRA HEAD WITHOUT CONTRAST  Technique:  Multiplanar, multiecho pulse sequences of the brain and surrounding structures were obtained without intravenous contrast. Angiographic images of the head were obtained using MRA technique without contrast.  Comparison:  MRI 09/02/2011  MRI HEAD  Findings:  Negative for acute infarct.  Moderate atrophy.  Chronic microvascular ischemic changes throughout the white matter.  Chronic infarcts in the basal ganglia and thalami bilaterally.  Extensive chronic ischemia in the pons especially on the left which is unchanged from the prior study. Chronic area of hemorrhage in the right pons and central pons are unchanged.  Negative for mass lesion.  Some chronic sinusitis.  IMPRESSION: Atrophy and extensive chronic ischemic changes.  No acute infarct.  MRA HEAD  Findings: Distal right vertebral artery  is not visualized and may be hypoplastic or occluded.  Left vertebral artery and basilar are widely patent.  Stenosis of the proximal AICA bilaterally.  Left PICA is patent.  Right AICA not visualized.  Superior cerebellar and posterior cerebral arteries are patent bilaterally.  Mild narrowing of the posterior cerebral artery bilaterally.  Internal carotid artery is patent bilaterally with mild atherosclerotic irregularity in the cavernous segment bilaterally. Diffuse intracranial atherosclerotic disease is seen with multiple areas of stenosis throughout the anterior and middle cerebral artery branches.  No large vessel occlusion or aneurysm.  IMPRESSION: Moderately severe intracranial atherosclerotic disease.  Right vertebral artery not visualized.  Original Report Authenticated By: Camelia Phenes, M.D.   Mr Mra Head/brain Wo Cm  01/07/2012  *RADIOLOGY REPORT*  Clinical Data:  TIA  MRI HEAD WITHOUT CONTRAST MRA HEAD WITHOUT CONTRAST  Technique:  Multiplanar, multiecho pulse sequences of the brain and surrounding structures were obtained without intravenous contrast. Angiographic images of the head were obtained using MRA technique without contrast.  Comparison:  MRI 09/02/2011  MRI HEAD  Findings:  Negative for acute infarct.  Moderate atrophy.  Chronic microvascular ischemic changes throughout the white matter.  Chronic infarcts in the basal ganglia and thalami bilaterally.  Extensive chronic ischemia in the pons especially on the left which is unchanged from the prior study. Chronic area of hemorrhage in the right pons and central pons are unchanged.  Negative for mass lesion.  Some chronic sinusitis.  IMPRESSION: Atrophy and extensive chronic ischemic changes.  No acute infarct.  MRA HEAD  Findings: Distal right vertebral artery is not visualized and may be hypoplastic or occluded.  Left vertebral artery and basilar are widely patent.  Stenosis of the proximal AICA bilaterally.  Left PICA is patent.  Right  AICA not visualized.  Superior cerebellar and posterior cerebral arteries are patent bilaterally.  Mild narrowing of the posterior cerebral artery bilaterally.  Internal carotid artery is patent bilaterally with mild atherosclerotic irregularity in the cavernous segment bilaterally. Diffuse intracranial atherosclerotic disease is seen with multiple areas of stenosis throughout the anterior and middle cerebral artery branches.  No large vessel occlusion or aneurysm.  IMPRESSION: Moderately severe intracranial atherosclerotic disease.  Right vertebral artery not visualized.  Original Report Authenticated By: Camelia Phenes, M.D.    Medications:  Scheduled:   . atorvastatin  10 mg Oral q1800  . dipyridamole-aspirin  1 capsule Oral BID  . enoxaparin  40 mg Subcutaneous Q24H  . insulin aspart  0-9 Units Subcutaneous TID WC  . levETIRAcetam  500 mg Oral BID  . moxifloxacin  400 mg Oral q1800  . pantoprazole  40 mg Oral QPM  . DISCONTD: moxifloxacin  400 mg Intravenous Q24H    Assessment/Plan: 1. Seizure vs tia stable on keppra 2. htn restart lotrel 3. Dm restart metformin and lantus 4. hyperchol will reduce statin MWF as at home  LOS: 3 days   [facility] 01/09/2012, 10:22 AM

## 2012-01-10 LAB — GLUCOSE, CAPILLARY
Glucose-Capillary: 100 mg/dL — ABNORMAL HIGH (ref 70–99)
Glucose-Capillary: 142 mg/dL — ABNORMAL HIGH (ref 70–99)

## 2012-01-10 MED ORDER — LEVETIRACETAM 500 MG PO TABS: 500.0000 mg | ORAL_TABLET | Freq: Two times a day (BID) | ORAL | Status: DC

## 2012-01-10 NOTE — Discharge Summary (Signed)
Physician Discharge Summary  Patient ID: Megan [name] MRN: [id number] DOB/AGE: [birth year] 77 y.o.  Admit date: 01/06/2012 Discharge date: 01/10/2012  Admission Diagnoses: Partial complex seizure Possible pneumonia Diabetes mellitus Hypertension   Discharge Diagnoses:  Principal Problem:  Partial complex seizure Active Problems:  Community acquired pneumonia  Diabetes mellitus  HTN (hypertension)  H/O: CVA (cardiovascular accident)   Discharged Condition: good  Hospital Course: The patient was admitted with an episode of staring associated with some right-sided weakness of the arm. CT scan showed no acute change. She was seen by neurology and started on Keppra. An MRI of the brain showed no acute infarct. MRA showed diffuse atherosclerotic disease. Carotid ultrasound no significant stenosis. Echocardiogram unremarkable. She had an EEG which per neurology with abnormal revealing a paucity of fast her activities of the left temporal region and an increased amount of a rhythmic delta activity suggesting underlying structural abnormality in that region. The patient was discharged on Keppra. Her right-sided weakness resolved soon after admission. She was seen by physical therapy and occupational therapy and home physical therapist felt to be indicated and appropriate. The patient's blood sugars remained under good control. She had leukocytosis at admission which resolved after one day. There was a concern for pneumonia although she clinically had very few symptoms and she was treated with 4 days of Avelox. Her x-ray showed left lower lobe infiltrate versus atelectasis.  CODE STATUS full code Diet diabetic diet Activity as per physical therapy  Consults: neurology  Significant Diagnostic Studies: labs: See above and radiology: CXR: Atelectasis left base, MRI: No acute infarct, CT scan:  No acute infarct and Ultrasound: No significant carotid stenosis  Treatments: IV hydration and antibiotics:  Avelox  Discharge Exam: Blood pressure 108/57, pulse 99, temperature 98.7 F (37.1 C), temperature source Oral, resp. rate 18, height 5\' 2"  (1.575 m), weight 58.7 kg (129 lb 6.6 oz), SpO2 99.00%. Resp: clear to auscultation bilaterally Cardio: regular rate and rhythm, S1, S2 normal, no murmur, click, rub or gallop  Disposition: 06-Home-Health Care Svc   Medication List  As of 01/10/2012  6:10 AM   TAKE these medications         amLODipine-benazepril 5-10 MG per capsule   Commonly known as: LOTREL   Take 1 capsule by mouth daily.      dipyridamole-aspirin 25-200 MG per 12 hr capsule   Commonly known as: AGGRENOX   Take 1 capsule by mouth 2 (two) times daily.      insulin glargine 100 UNIT/ML injection   Commonly known as: LANTUS   Inject 15 Units into the skin at bedtime.      levETIRAcetam 500 MG tablet   Commonly known as: KEPPRA   Take 1 tablet (500 mg total) by mouth 2 (two) times daily with a meal.      metFORMIN 500 MG tablet   Commonly known as: GLUCOPHAGE   Take 1,000 mg by mouth 2 (two) times daily with a meal.      pantoprazole 40 MG tablet   Commonly known as: PROTONIX   Take 40 mg by mouth every evening.      rosuvastatin 5 MG tablet   Commonly known as: CRESTOR   Take 5 mg by mouth every Monday, Wednesday, and Friday.           Follow-up Information    Follow up with Lillia Mountain, MD in 10 days.   Contact information:   [address], [address] [place], [place].  [place] [address] [phone]          Signed: Lillia Mountain 01/10/2012, 6:10 AM

## 2012-01-10 NOTE — Progress Notes (Signed)
Physical Therapy Treatment Patient Details Name: Megan [name] MRN: [id number] DOB: 1921/10/25 Today's Date: 01/10/2012 Time: 1191-4782 PT Time Calculation (min): 25 min  PT Assessment / Plan / Recommendation Comments on Treatment Session  Pt with improved mobility with less assist this session for transfers.  Educated pt's daughter on proper techinque for safety and body position.    Follow Up Recommendations  Home health PT;Supervision/Assistance - 24 hour    Barriers to Discharge        Equipment Recommendations       Recommendations for Other Services    Frequency Min 4X/week   Plan Discharge plan remains appropriate;Frequency remains appropriate    Precautions / Restrictions Precautions Precautions: Fall   Pertinent Vitals/Pain No c/o pain    Mobility  Transfers Transfers: Sit to Stand;Stand to Sit;Stand Pivot Transfers Sit to Stand: 3: Mod assist Stand to Sit: 3: Mod assist;To chair/3-in-1;To bed;With armrests Stand Pivot Transfers: With armrests;4: Min assist Details for Transfer Assistance: VCs for UE and LE placement.  Pt's daughter present and performed most of the transfers.  Min (A) to mod (A) with tactile cues at shoulders and hips to maintain balance.  Daughter able to perform transfer from bed to recliner. Modified Rankin (Stroke Patients Only) Modified Rankin: Severe disability    Exercises     PT Diagnosis:    PT Problem List:   PT Treatment Interventions:     PT Goals Acute Rehab PT Goals PT Goal Formulation: With patient/family Time For Goal Achievement: 01/15/12 Potential to Achieve Goals: Fair Pt will go Sit to Stand: with min assist PT Goal: Sit to Stand - Progress: Progressing toward goal Pt will go Stand to Sit: with min assist PT Goal: Stand to Sit - Progress: Progressing toward goal Pt will Transfer Bed to Chair/Chair to Bed: with min assist PT Transfer Goal: Bed to Chair/Chair to Bed - Progress: Progressing toward goal  Visit  Information  Last PT Received On: 01/10/12 Assistance Needed: +1    Subjective Data      Cognition  Overall Cognitive Status: History of cognitive impairments - at baseline Arousal/Alertness: Awake/alert Orientation Level: Disoriented to;Time    Balance  Balance Balance Assessed: Yes Static Sitting Balance Static Sitting - Balance Support: Feet supported Static Sitting - Level of Assistance: 5: Stand by assistance  End of Session PT - End of Session Equipment Utilized During Treatment: Gait belt Activity Tolerance: Patient tolerated treatment well Patient left: in chair;with call bell/phone within reach;with family/visitor present Nurse Communication: Mobility status    Linzee Depaul 01/10/2012, 10:10 AM Jake Shark, PT DPT [phone]

## 2012-01-10 NOTE — Discharge Instructions (Signed)
STROKE/TIA DISCHARGE INSTRUCTIONS SMOKING Cigarette smoking nearly doubles your risk of having a stroke & is the single most alterable risk factor  If you smoke or have smoked in the last 12 months, you are advised to quit smoking for your health.  Most of the excess cardiovascular risk related to smoking disappears within a year of stopping.  Ask you doctor about anti-smoking medications   Quit Line: 1-800-QUIT NOW  Free Smoking Cessation Classes [phone]  CHOLESTEROL Know your levels; limit fat & cholesterol in your diet  Lipid Panel     Component Value Date/Time   CHOL 200 03/14/2011 0453   TRIG 59 03/14/2011 0453   HDL 54 03/14/2011 0453   CHOLHDL 3.7 03/14/2011 0453   VLDL 12 03/14/2011 0453   LDLCALC 134* 03/14/2011 0453      Many patients benefit from treatment even if their cholesterol is at goal.  Goal: Total Cholesterol (CHOL) less than 160  Goal:  Triglycerides (TRIG) less than 150  Goal:  HDL greater than 40  Goal:  LDL (LDLCALC) less than 100   BLOOD PRESSURE American Stroke Association blood pressure target is less that 120/80 mm/Hg  Your discharge blood pressure is:  BP: 98/47 mmHg  Monitor your blood pressure  Limit your salt and alcohol intake  Many individuals will require more than one medication for high blood pressure  DIABETES (A1c is a blood sugar average for last 3 months) Goal HGBA1c is under 7% (HBGA1c is blood sugar average for last 3 months)  Diabetes: {STROKE DC DIABETES:22357}    Lab Results  Component Value Date   HGBA1C 9.7* 03/14/2011     Your HGBA1c can be lowered with medications, healthy diet, and exercise.  Check your blood sugar as directed by your physician  Call your physician if you experience unexplained or low blood sugars.  PHYSICAL ACTIVITY/REHABILITATION Goal is 30 minutes at least 4 days per week    {STROKE DC ACTIVITY/REHAB:22359}  Activity decreases your risk of heart attack and stroke and makes your heart  stronger.  It helps control your weight and blood pressure; helps you relax and can improve your mood.  Participate in a regular exercise program.  Talk with your doctor about the best form of exercise for you (dancing, walking, swimming, cycling).  DIET/WEIGHT Goal is to maintain a healthy weight  Your discharge diet is: NPO *** liquids Your height is:  Height: 5\' 2"  (157.5 cm) Your current weight is: Weight: 58.7 kg (129 lb 6.6 oz) Your Body Mass Index (BMI) is:  BMI (Calculated): 23.7   Following the type of diet specifically designed for you will help prevent another stroke.  Your goal weight range is:  ***  Your goal Body Mass Index (BMI) is 19-24.  Healthy food habits can help reduce 3 risk factors for stroke:  High cholesterol, hypertension, and excess weight.  RESOURCES Stroke/Support Group:  Call [phone]  they meet the 3rd Sunday of the month on the Rehab Unit at [facility], [place] ( no meetings June, July & Aug).  STROKE EDUCATION PROVIDED/REVIEWED AND GIVEN TO PATIENT Stroke warning signs and symptoms How to activate emergency medical system (call 911). Medications prescribed at discharge. Need for follow-up after discharge. Personal risk factors for stroke. Pneumonia vaccine given:   {STROKE DC YES/NO/DATE:22363} Flu vaccine given:   {STROKE DC YES/NO/DATE:22363} My questions have been answered, the writing is legible, and I understand these instructions.  I will adhere to these goals & educational materials that have  been provided to me after my discharge from the hospital.

## 2012-01-10 NOTE — Care Management Note (Signed)
    Page 1 of 1   01/10/2012     2:36:28 PM   CARE MANAGEMENT NOTE 01/10/2012  Patient:  Megan [name], Megan [name]   Account Number:  [id number]  Date Initiated:  01/10/2012  Documentation initiated by:  Onnie Boer  Subjective/Objective Assessment:   PT WAS ADMITTED WITH WEAKNESS AND SLURRED SPEECH     Action/Plan:   PROGRESSION OF CARE AND DISCHARGE PLANNING   Anticipated DC Date:  01/10/2012   Anticipated DC Plan:  HOME W HOME HEALTH SERVICES      DC Planning Services  CM consult      Choice offered to / List presented to:  C-4 Adult Children        HH arranged  HH-2 PT      HH agency  [facility]   Status of service:  Completed, signed off Medicare Important Message given?   (If response is "NO", the following Medicare IM given date fields will be blank) Date Medicare IM given:   Date Additional Medicare IM given:    Discharge Disposition:  HOME W HOME HEALTH SERVICES  Per UR Regulation:  Reviewed for med. necessity/level of care/duration of stay  If discussed at Long Length of Stay Meetings, dates discussed:    Comments:  01/10/12 Onnie Boer, RN, BSN 1434 PT WAS DC'D TO HOME WITH HH PT [facility].  PT HAS HAD THEIR SERVICES IN THE PAST.  FAMILY HAS QUESTIONED RESOURCES TO HELP FUND PRIVATE SITTER CARE.

## 2012-09-03 ENCOUNTER — Observation Stay (HOSPITAL_COMMUNITY): Payer: Medicare Other

## 2012-09-03 ENCOUNTER — Encounter (HOSPITAL_COMMUNITY): Payer: Self-pay

## 2012-09-03 ENCOUNTER — Observation Stay (HOSPITAL_COMMUNITY)
Admission: EM | Admit: 2012-09-03 | Discharge: 2012-09-06 | Disposition: A | Discharge status: Home / Self Care | Payer: Medicare Other | Attending: Internal Medicine | Admitting: Internal Medicine

## 2012-09-03 ENCOUNTER — Emergency Department (HOSPITAL_COMMUNITY): Payer: Medicare Other

## 2012-09-03 DIAGNOSIS — I69959 Hemiplegia and hemiparesis following unspecified cerebrovascular disease affecting unspecified side: Secondary | ICD-10-CM | POA: Insufficient documentation

## 2012-09-03 DIAGNOSIS — E119 Type 2 diabetes mellitus without complications: Secondary | ICD-10-CM | POA: Insufficient documentation

## 2012-09-03 DIAGNOSIS — R569 Unspecified convulsions: Secondary | ICD-10-CM

## 2012-09-03 DIAGNOSIS — I6350 Cerebral infarction due to unspecified occlusion or stenosis of unspecified cerebral artery: Principal | ICD-10-CM | POA: Insufficient documentation

## 2012-09-03 DIAGNOSIS — I6529 Occlusion and stenosis of unspecified carotid artery: Secondary | ICD-10-CM | POA: Insufficient documentation

## 2012-09-03 DIAGNOSIS — E785 Hyperlipidemia, unspecified: Secondary | ICD-10-CM | POA: Insufficient documentation

## 2012-09-03 DIAGNOSIS — I658 Occlusion and stenosis of other precerebral arteries: Secondary | ICD-10-CM | POA: Insufficient documentation

## 2012-09-03 DIAGNOSIS — I639 Cerebral infarction, unspecified: Secondary | ICD-10-CM

## 2012-09-03 DIAGNOSIS — R32 Unspecified urinary incontinence: Secondary | ICD-10-CM | POA: Insufficient documentation

## 2012-09-03 DIAGNOSIS — R609 Edema, unspecified: Secondary | ICD-10-CM | POA: Insufficient documentation

## 2012-09-03 DIAGNOSIS — I10 Essential (primary) hypertension: Secondary | ICD-10-CM | POA: Diagnosis present

## 2012-09-03 DIAGNOSIS — Z8673 Personal history of transient ischemic attack (TIA), and cerebral infarction without residual deficits: Secondary | ICD-10-CM

## 2012-09-03 DIAGNOSIS — Z7982 Long term (current) use of aspirin: Secondary | ICD-10-CM | POA: Insufficient documentation

## 2012-09-03 DIAGNOSIS — E1165 Type 2 diabetes mellitus with hyperglycemia: Secondary | ICD-10-CM | POA: Diagnosis present

## 2012-09-03 DIAGNOSIS — IMO0002 Reserved for concepts with insufficient information to code with codable children: Secondary | ICD-10-CM | POA: Diagnosis present

## 2012-09-03 DIAGNOSIS — Z79899 Other long term (current) drug therapy: Secondary | ICD-10-CM | POA: Insufficient documentation

## 2012-09-03 DIAGNOSIS — I6789 Other cerebrovascular disease: Secondary | ICD-10-CM

## 2012-09-03 DIAGNOSIS — D649 Anemia, unspecified: Secondary | ICD-10-CM | POA: Insufficient documentation

## 2012-09-03 LAB — CBC WITH DIFFERENTIAL/PLATELET
Hemoglobin: 9.5 g/dL — ABNORMAL LOW (ref 12.0–15.0)
Lymphocytes Relative: 25 % (ref 12–46)
Lymphs Abs: 2 10*3/uL (ref 0.7–4.0)
MCH: 30.2 pg (ref 26.0–34.0)
Monocytes Relative: 10 % (ref 3–12)
Neutro Abs: 5.2 10*3/uL (ref 1.7–7.7)
Neutrophils Relative %: 65 % (ref 43–77)
RBC: 3.15 MIL/uL — ABNORMAL LOW (ref 3.87–5.11)

## 2012-09-03 LAB — URINALYSIS, ROUTINE W REFLEX MICROSCOPIC
Bilirubin Urine: NEGATIVE
Leukocytes, UA: NEGATIVE
Nitrite: NEGATIVE
Specific Gravity, Urine: 1.019 (ref 1.005–1.030)
pH: 6.5 (ref 5.0–8.0)

## 2012-09-03 LAB — POCT I-STAT, CHEM 8
BUN: 18 mg/dL (ref 6–23)
Chloride: 103 mEq/L (ref 96–112)
Creatinine, Ser: 0.9 mg/dL (ref 0.50–1.10)
Potassium: 5 mEq/L (ref 3.5–5.1)
Sodium: 140 mEq/L (ref 135–145)

## 2012-09-03 LAB — COMPREHENSIVE METABOLIC PANEL
ALT: 8 U/L (ref 0–35)
Albumin: 3.4 g/dL — ABNORMAL LOW (ref 3.5–5.2)
Alkaline Phosphatase: 68 U/L (ref 39–117)
BUN: 17 mg/dL (ref 6–23)
Potassium: 4.9 mEq/L (ref 3.5–5.1)
Sodium: 136 mEq/L (ref 135–145)
Total Protein: 7 g/dL (ref 6.0–8.3)

## 2012-09-03 LAB — POCT I-STAT TROPONIN I

## 2012-09-03 LAB — TROPONIN I: Troponin I: <0.3 ng/mL (ref <0.30)

## 2012-09-03 LAB — APTT: aPTT: 29 seconds (ref 24–37)

## 2012-09-03 LAB — PROTIME-INR
INR: 0.98 (ref 0.00–1.49)
Prothrombin Time: 12.9 seconds (ref 11.6–15.2)

## 2012-09-03 LAB — GLUCOSE, CAPILLARY: Glucose-Capillary: 115 mg/dL — ABNORMAL HIGH (ref 70–99)

## 2012-09-03 MED ORDER — LORAZEPAM 2 MG/ML IJ SOLN
INTRAMUSCULAR | Status: AC
  Filled 2012-09-03: qty 1

## 2012-09-03 MED ORDER — ONDANSETRON HCL 4 MG/2ML IJ SOLN: 4.0000 mg | Freq: Three times a day (TID) | INTRAMUSCULAR | Status: AC | PRN

## 2012-09-03 MED ORDER — LORAZEPAM 2 MG/ML IJ SOLN: 0.5000 mg | Freq: Once | INTRAMUSCULAR | Status: DC

## 2012-09-03 MED ORDER — ASPIRIN-DIPYRIDAMOLE ER 25-200 MG PO CP12
1.0000 | ORAL_CAPSULE | Freq: Two times a day (BID) | ORAL | Status: DC
  Administered 2012-09-03 – 2012-09-06 (×6): 1 via ORAL
  Filled 2012-09-03 (×7): qty 1

## 2012-09-03 MED ORDER — SENNOSIDES-DOCUSATE SODIUM 8.6-50 MG PO TABS
1.0000 | ORAL_TABLET | Freq: Every evening | ORAL | Status: DC | PRN
  Filled 2012-09-03: qty 1

## 2012-09-03 MED ORDER — LORAZEPAM 2 MG/ML IJ SOLN
0.5000 mg | Freq: Once | INTRAMUSCULAR | Status: AC
  Administered 2012-09-03: 0.5 mg via INTRAVENOUS

## 2012-09-03 MED ORDER — ENOXAPARIN SODIUM 40 MG/0.4ML ~~LOC~~ SOLN
40.0000 mg | SUBCUTANEOUS | Status: DC
  Administered 2012-09-03 – 2012-09-05 (×3): 40 mg via SUBCUTANEOUS
  Filled 2012-09-03 (×4): qty 0.4

## 2012-09-03 MED ORDER — LORAZEPAM 2 MG/ML IJ SOLN
0.5000 mg | Freq: Once | INTRAMUSCULAR | Status: AC
  Administered 2012-09-03: 0.5 mg via INTRAVENOUS
  Filled 2012-09-03: qty 1

## 2012-09-03 MED ORDER — PANTOPRAZOLE SODIUM 40 MG PO TBEC
40.0000 mg | DELAYED_RELEASE_TABLET | Freq: Every evening | ORAL | Status: DC
  Administered 2012-09-04 – 2012-09-05 (×2): 40 mg via ORAL
  Filled 2012-09-03 (×2): qty 1

## 2012-09-03 MED ORDER — INSULIN ASPART 100 UNIT/ML ~~LOC~~ SOLN: 0.0000 [IU] | SUBCUTANEOUS | Status: DC

## 2012-09-03 MED ORDER — ATORVASTATIN CALCIUM 40 MG PO TABS
40.0000 mg | ORAL_TABLET | Freq: Every day | ORAL | Status: DC
  Administered 2012-09-03 – 2012-09-04 (×2): 40 mg via ORAL
  Filled 2012-09-03 (×4): qty 1

## 2012-09-03 MED ORDER — METFORMIN HCL 500 MG PO TABS
1000.0000 mg | ORAL_TABLET | Freq: Two times a day (BID) | ORAL | Status: DC
  Administered 2012-09-04 – 2012-09-06 (×5): 1000 mg via ORAL
  Filled 2012-09-03 (×8): qty 2

## 2012-09-03 NOTE — ED Provider Notes (Signed)
History     CSN: [id number]  Arrival date & time 09/03/12  0216   First MD Initiated Contact with Patient 09/03/12 0229      Chief Complaint  Patient presents with  . Weakness    (Consider location/radiation/quality/duration/timing/severity/associated sxs/prior treatment) HPI Comments: 79 year old female with a history of diabetes, hyperlipidemia, prior stroke affecting the right side of her body was last seen normal at 9:30 this evening. She states that she had a hard time getting out of bed because her right side was severely weak and much more weak than it usually is. This involves both her right arm and her right leg and the family notes that she was dragging her right leg and could not use her walker. This is a new problem, it is in the same location as her prior stroke. She denies any difficulty swallowing, speaking or with vision. The symptoms are persistent and severe. She has not been eating or drinking well today, she has not made any urine in the last 12 hours. There has been no fevers, cough, diarrhea but she does have chronic swelling of her right lower extremity  Patient is a 79 y.o. female presenting with weakness. The history is provided by the patient, a relative and medical records.  Weakness  Additional symptoms include weakness.    Past Medical History  Diagnosis Date  . Stroke   . Diabetes mellitus   . Hyperlipidemia   . Seizure     History reviewed. No pertinent past surgical history.  No family history on file.  History  Substance Use Topics  . Smoking status: Never Smoker   . Smokeless tobacco: Former Neurosurgeon    Types: Snuff  . Alcohol Use: No    OB History    Grav Para Term Preterm Abortions TAB SAB Ect Mult Living                  Review of Systems  Neurological: Positive for weakness.  All other systems reviewed and are negative.    Allergies  Review of patient's allergies indicates no known allergies.  Home Medications   Current  Outpatient Rx  Name  Route  Sig  Dispense  Refill  . AMLODIPINE BESY-BENAZEPRIL HCL 5-10 MG PO CAPS   Oral   Take 1 capsule by mouth daily.         . ASPIRIN-DIPYRIDAMOLE ER 25-200 MG PO CP12   Oral   Take 1 capsule by mouth 2 (two) times daily.         Marland Kitchen METFORMIN HCL 500 MG PO TABS   Oral   Take 1,000 mg by mouth 2 (two) times daily with a meal.         . PANTOPRAZOLE SODIUM 40 MG PO TBEC   Oral   Take 40 mg by mouth every evening.         Marland Kitchen ROSUVASTATIN CALCIUM 5 MG PO TABS   Oral   Take 5 mg by mouth every Monday, Wednesday, and Friday.         . SODIUM CHLORIDE 0.9 % IN AERS   Nebulization   Take 2 sprays by nebulization as needed.           BP 110/63  Pulse 95  Temp 98.7 F (37.1 C)  Resp 14  SpO2 100%  Physical Exam  Nursing note and vitals reviewed. Constitutional: She appears well-developed and well-nourished. No distress.  HENT:  Head: Normocephalic and atraumatic.  Mouth/Throat: Oropharynx is clear and  moist. No oropharyngeal exudate.       Severely hard of hearing  Eyes: Conjunctivae normal and EOM are normal. Pupils are equal, round, and reactive to light. Right eye exhibits no discharge. Left eye exhibits no discharge. No scleral icterus.  Neck: Normal range of motion. Neck supple. No JVD present. No thyromegaly present.  Cardiovascular: Normal rate, regular rhythm, normal heart sounds and intact distal pulses.  Exam reveals no gallop and no friction rub.   No murmur heard. Pulmonary/Chest: Effort normal and breath sounds normal. No respiratory distress. She has no wheezes. She has no rales.  Abdominal: Soft. Bowel sounds are normal. She exhibits no distension and no mass. There is no tenderness.  Musculoskeletal: Normal range of motion. She exhibits edema ( Bilateral pitting edema, right greater than left lower extremity). She exhibits no tenderness.  Lymphadenopathy:    She has no cervical adenopathy.  Neurological: She is alert.  Coordination normal.       The patient has 4/5 weakness of the right upper extremity, 4 minus out of 5 weakness of the right lower extremity, normal left upper and left lower extremity, answers questions appropriately, no obvious facial droop  Skin: Skin is warm and dry. No rash noted. No erythema.  Psychiatric: She has a normal mood and affect. Her behavior is normal.    ED Course  Procedures (including critical care time)  Labs Reviewed  COMPREHENSIVE METABOLIC PANEL - Abnormal; Notable for the following:    Glucose, Bld 127 (*)     Calcium 10.6 (*)     Albumin 3.4 (*)     Total Bilirubin 0.2 (*)     GFR calc non Af Amer 51 (*)     GFR calc Af Amer 59 (*)     All other components within normal limits  URINALYSIS, ROUTINE W REFLEX MICROSCOPIC - Abnormal; Notable for the following:    Ketones, ur 15 (*)     All other components within normal limits  CBC WITH DIFFERENTIAL - Abnormal; Notable for the following:    RBC 3.15 (*)     Hemoglobin 9.5 (*)     HCT 29.3 (*)     All other components within normal limits  GLUCOSE, CAPILLARY - Abnormal; Notable for the following:    Glucose-Capillary 126 (*)     All other components within normal limits  POCT I-STAT, CHEM 8 - Abnormal; Notable for the following:    Glucose, Bld 124 (*)     Calcium, Ion 1.37 (*)     Hemoglobin 9.9 (*)     HCT 29.0 (*)     All other components within normal limits  PROTIME-INR  APTT  TROPONIN I  POCT I-STAT TROPONIN I  CBC  DIFFERENTIAL   Ct Head Wo Contrast  09/03/2012  *RADIOLOGY REPORT*  Clinical Data: Right-sided weakness.  CT HEAD WITHOUT CONTRAST  Technique:  Contiguous axial images were obtained from the base of the skull through the vertex without contrast.  Comparison: 01/06/2012.  MRI 01/07/2012.  Findings: There is atrophy and chronic small vessel disease changes. Old bilateral basal ganglia infarcts. No acute intracranial abnormality.  Specifically, no hemorrhage, hydrocephalus, mass lesion,  acute infarction, or significant intracranial injury.  No acute calvarial abnormality. Visualized paranasal sinuses and mastoids clear.  Orbital soft tissues unremarkable.  IMPRESSION: No acute intracranial abnormality.  Atrophy, chronic microvascular disease.   Original Report Authenticated By: Charlett Nose, M.D.      1. Stroke  MDM  The patient appears to have had worsening of her right-sided weakness. This could be worsening stroke, would also consider dehydration or urinary infection. Labs pending, CT scan of the head ordered, care discussed with Dr. Amada Jupiter of neurology who will see the patient immediately and recommends against activating could stroke.  ED ECG REPORT  I personally interpreted this EKG   Date: 09/03/2012   Rate: 96  Rhythm: normal sinus rhythm  QRS Axis: normal  Intervals: PR prolonged  ST/T Wave abnormalities: nonspecific ST/T changes  Conduction Disutrbances:first-degree A-V block   Narrative Interpretation:   Old EKG Reviewed: Compared with 01/06/2012, no significant changes  Care discussed with neurologist, he agrees with admission to the hospital and MRI this morning, CT scan negative for acute stroke, labs unremarkable other than anemia which is not new. Urinalysis clear with only mild ketonuria. Discussed with hospitalist Dr. Lovell Sheehan who agrees with observational admission, holding orders written.     Vida Roller, MD 09/03/12 [phone]

## 2012-09-03 NOTE — ED Notes (Signed)
Dr Miller at bedside. 

## 2012-09-03 NOTE — ED Notes (Signed)
Cbg was 126.

## 2012-09-03 NOTE — ED Notes (Signed)
Per EMS, pt has increased weakness. LSN 2130 on 09/03/11. Hx of right sided weakness from previous stroke, daughters report increase in right sided weakness.

## 2012-09-03 NOTE — ED Notes (Signed)
cbg reads 128

## 2012-09-03 NOTE — H&P (Signed)
[facility] History and Physical  Megan [name] WUX:[id number] DOB: Dec 16, 1921 DOA: 09/03/2012  Referring physician: EDP PCP: Lillia Mountain, MD  Specialists:   Chief Complaint: Weakness Right Side  HPI: Megan [name] is a 79 y.o. female who was found to have worsened right sided weakness when her daughter got her up to go to the bathroom at 1:30AM.   She has a history of 2 previous CVAs with residual right hemiparesis but was able to walk with assistance, however, tonight she was not able to move or lift her right arm, or her right leg.  She denied having any chest pain or headache.   Her family had her brought to the ED for evaluation, and she was beyond the window of treatment for TPA therapy.  She was referred for admission to further evaluate for a CVA.    Review of Systems: The patient denies anorexia, fever, chills, headache, lightheadedness, vertigo, weight loss, vision loss, decreased hearing, hoarseness, chest pain, syncope, dyspnea on exertion, peripheral edema, balance deficits, hemoptysis, abdominal pain, nausea, vomiting, diarrhea, constipation, hematemesis, melena, hematochezia, severe indigestion/heartburn, hematuria, incontinence, genital sores, suspicious skin lesions, transient blindness, depression, unusual weight change, abnormal bleeding, enlarged lymph nodes, angioedema, and breast masses.    Past Medical History  Diagnosis Date  . Stroke   . Diabetes mellitus   . Hyperlipidemia   . Seizure      Past Surgical History           Cataract Surgery Right Eye   Medications:  HOME MEDS: Prior to Admission medications   Medication Sig Start Date End Date Taking? Authorizing Provider  amLODipine-benazepril (LOTREL) 5-10 MG per capsule Take 1 capsule by mouth daily.   Yes Historical Provider, MD  dipyridamole-aspirin (AGGRENOX) 25-200 MG per 12 hr capsule Take 1 capsule by mouth 2 (two) times daily.   Yes Historical Provider, MD  metFORMIN (GLUCOPHAGE) 500 MG  tablet Take 1,000 mg by mouth 2 (two) times daily with a meal.   Yes Historical Provider, MD  pantoprazole (PROTONIX) 40 MG tablet Take 40 mg by mouth every evening.   Yes Historical Provider, MD  rosuvastatin (CRESTOR) 5 MG tablet Take 5 mg by mouth every Monday, Wednesday, and Friday.   Yes Historical Provider, MD  sodium chloride (BRONCHO SALINE) inhaler solution Take 2 sprays by nebulization as needed.   Yes Historical Provider, MD    Allergies:  No Known Allergies  Social History:   reports that she has never smoked. She has quit using smokeless tobacco. Her smokeless tobacco use included Snuff. She reports that she does not drink alcohol or use illicit drugs.  Family History: Family History  Problem Relation Age of Onset  . CAD Mother   . Hypertension Mother   . Diabetes Mother      Physical Exam:  GEN:  Pleasant elderly 79 year old thin African American Female  examined  and in no acute distress; cooperative with exam Filed Vitals:  09/03/12 0245 09/03/12 0346 09/03/12 0400 BP: 119/49  110/63 Pulse: 98  95 Temp:  98.7 F (37.1 C)  Resp: 27  14 SpO2: 99%  100%  Blood pressure 110/63, pulse 95, temperature 98.7 F (37.1 C), resp. rate 14, SpO2 100.00%. PSYCH: She is alert and oriented x4; does not appear anxious does not appear depressed; affect is normal HEENT: Normocephalic and Atraumatic, Mucous membranes pink; PERRLA; EOM intact; Fundi:  Benign;  No scleral icterus, Nares: Patent, Oropharynx: Clear, Edentulous,  Neck:  FROM, no cervical lymphadenopathy nor  thyromegaly or carotid bruit; no JVD; Breasts:: Not examined CHEST WALL: No tenderness CHEST: Normal respiration, clear to auscultation bilaterally HEART: Regular rate and rhythm; no murmurs rubs or gallops BACK: No kyphosis or scoliosis; no CVA tenderness ABDOMEN: Positive Bowel Sounds, Scaphoid, soft non-tender; no masses, no organomegaly. Rectal Exam: Not done EXTREMITIES: No bone or joint deformity;  age-appropriate arthropathy of the hands and knees; no cyanosis, clubbing or edema; no ulcerations. Genitalia: not examined PULSES: 2+ and symmetric SKIN: Normal hydration no rash or ulceration CNS: Cranial nerves 2-12 grossly intact Right sided hemiparesis, RUE = 3/5, LUE=4/5,  RLE=2/5, and LLE =45/5    Labs on Admission:  Basic Metabolic Panel:  Lab 09/03/12 1191 09/03/12 0307  NA 140 136  K 5.0 4.9  CL 103 97  CO2 -- 26  GLUCOSE 124* 127*  BUN 18 17  CREATININE 0.90 0.97  CALCIUM -- 10.6*  MG -- --  PHOS -- --   Liver Function Tests:  Lab 09/03/12 0307  AST 13  ALT 8  ALKPHOS 68  BILITOT 0.2*  PROT 7.0  ALBUMIN 3.4*   No results found for this basename: LIPASE:5,AMYLASE:5 in the last 168 hours No results found for this basename: AMMONIA:5 in the last 168 hours CBC:  Lab 09/03/12 0327 09/03/12 0307  WBC -- 8.1  NEUTROABS -- 5.2  HGB 9.9* 9.5*  HCT 29.0* 29.3*  MCV -- 93.0  PLT -- 290   Cardiac Enzymes:  Lab 09/03/12 0308  CKTOTAL --  CKMB --  CKMBINDEX --  TROPONINI <0.30    BNP (last 3 results) No results found for this basename: PROBNP:3 in the last 8760 hours CBG:  Lab 09/03/12 0253  GLUCAP 126*    Radiological Exams on Admission: Ct Head Wo Contrast  09/03/2012  *RADIOLOGY REPORT*  Clinical Data: Right-sided weakness.  CT HEAD WITHOUT CONTRAST  Technique:  Contiguous axial images were obtained from the base of the skull through the vertex without contrast.  Comparison: 01/06/2012.  MRI 01/07/2012.  Findings: There is atrophy and chronic small vessel disease changes. Old bilateral basal ganglia infarcts. No acute intracranial abnormality.  Specifically, no hemorrhage, hydrocephalus, mass lesion, acute infarction, or significant intracranial injury.  No acute calvarial abnormality. Visualized paranasal sinuses and mastoids clear.  Orbital soft tissues unremarkable.  IMPRESSION: No acute intracranial abnormality.  Atrophy, chronic microvascular  disease.   Original Report Authenticated By: Charlett Nose, M.D.        Assessment: Principal Problem:  *CVA (cerebral infarction) Active Problems:  Diabetes mellitus  HTN (hypertension)  Seizure    Plan:    Admit to Observation telemetry Bed for CVA Workup CVA Protocol Neuro checks, MRI/MRA of Brain this AM Neurology consulted in ED by EDP Reconcile Home Medications DVT prophylaxis Message left on [facility] for Dr. Valentina Lucks to see this AM 01/12.     Code Status:       FULL CODE Family Communication:     Daughters at bedside Disposition Plan:      Return to Home on Discharge  Time spent: 88 Minutes  Ron Parker [facility] Pager [phone]  If 7PM-7AM, please contact night-coverage [email or web address] Password [facility] 09/03/2012, 6:17 AM

## 2012-09-03 NOTE — ED Notes (Signed)
NIH done by Dr. Amada Jupiter. See physician notes for score.

## 2012-09-03 NOTE — ED Notes (Signed)
Dr. Kirkpatrick at bedside 

## 2012-09-03 NOTE — Consult Note (Signed)
Reason for Consult: Worsened right sided weakness Referring Physician: Helene Shoe   CC: Right sided weakness  History is obtained from: Daughters  HPI: Megan [name] is a 79 y.o. female with a history of previous stroke in 2012 who awoke tonight with worsened right sided weakness. She is normally able to walk with a walker, but was unable to tonight. Her family was concerned and therefore she was brought in for evaluation.  She denies any pain or recent illness.    ROS: A 14 point ROS was performed and is negative except as noted in the HPI.  Past Medical History  Diagnosis Date  . Stroke   . Diabetes mellitus   . Hyperlipidemia   . Seizure     Family History: Mother - CVA at 62  Social History: Tob: denies  Exam: Current vital signs: BP 119/49  Pulse 98  Resp 27  SpO2 99% Vital signs in last 24 hours: Pulse Rate:  [98] 98  (01/12 0245) Resp:  [27] 27  (01/12 0245) BP: (119)/(49) 119/49 mmHg (01/12 0245) SpO2:  [99 %] 99 % (01/12 0245)  General: in bed, NAD CV: RRR Mental Status: Patient is awake, alert, oriented to person, "hospital", month, year. Immediate and remote memory are intact. Follows commands immediately Cranial Nerves: II: Visual Fields are full. Pupils are equal but small, round, and reactive to light.  Discs are difficult.  III,IV, VI: EOMI without ptosis or diploplia.  V: Facial sensation is diminished on right to LT.  VII: Facial movement is symmetric.  VIII: Pt is very hard of hearing X: Uvula elevates symmetrically XI: Shoulder shrug is symmetric. XII: tongue is midline   Motor: Tone is normal. Bulk is normal. 5/5 strength was present in the left. The right was 3/5 in the RUE, 2/5 in the RLE.  Sensory: Sensation is symmetric to light touch in the arms and legs Deep Tendon Reflexes: 2+ and symmetric in the biceps and patellae.  Cerebellar: No clear ataxia on left, unable to perform on right.  Gait: Did not test 2/2 weakness.   I have  reviewed labs in epic and the results pertinent to this consultation are: Pending  CT head appears similar to previous to me, formal read pending.   Impression: 79 yo F with worsened right hemiparesis. Differential includes extension of old infarct, worsening of weakness with physiological stressor such as UTI or URI, post-ictal todd's phenomenon though no clear seizure was seen.   Recommendations: 1) MRI brain, if no new infarct, no need to repeat CVA workup.  2) UA 3) if both of the above are negative, and her weakness improves, could consider increasing Keppra dosage for presumed seizure. Ritta Slot, MD [facility] [phone]  If 7pm- 7am, please page neurology on call at [phone].

## 2012-09-03 NOTE — Progress Notes (Signed)
Subjective: Weak and lethargic after receiving ativan this morning for MRI.  She does have pronounced right sided facial droop.  She is accompanied by her daughter who reports that at 1am this morning the patient woke up to use the bathroom, but was unable to use the right leg or arm.  She has had a stroke in the past with residual weakness on the right, but is usually able to ambulate with a walker.  This morning she could not get out of bed. There was no confusion or slurred speech.  She has been in her usual state of health until this morning. Daughter does report decreased PO intake and urine output for about 24 hours.  Objective: Vital signs in last 24 hours: Temp:  [97.1 F (36.2 C)-98.7 F (37.1 C)] 97.1 F (36.2 C) (01/12 1500) Pulse Rate:  [87-105] 88  (01/12 1500) Resp:  [14-27] 18  (01/12 1500) BP: (89-137)/(35-78) 137/59 mmHg (01/12 1500) SpO2:  [96 %-100 %] 98 % (01/12 1500) Weight:  [55.1 kg (121 lb 7.6 oz)] 55.1 kg (121 lb 7.6 oz) (01/12 0926) Weight change:  Last BM Date: 09/02/12  Intake/Output from previous day:   Intake/Output this shift:    General appearance: fatigued and no distress Resp: clear to auscultation bilaterally Cardio: regular rate and rhythm, S1, S2 normal, no murmur, click, rub or gallop GI: soft, non-tender; bowel sounds normal; no masses,  no organomegaly Extremities: extremities normal, atraumatic, no cyanosis or edema Neuro: groggy, states that she does not know what is going on, oriented to person and family, right sided facial droop, PERRLA, strength on the right side is 4/5 upper and lower, 5/5 on the left  Lab Results:  [facility] 09/03/12 0327 09/03/12 0307  WBC -- 8.1  HGB 9.9* 9.5*  HCT 29.0* 29.3*  PLT -- 290   BMET  Basename 09/03/12 0327 09/03/12 0307  NA 140 136  K 5.0 4.9  CL 103 97  CO2 -- 26  GLUCOSE 124* 127*  BUN 18 17  CREATININE 0.90 0.97  CALCIUM -- 10.6*    Studies/Results: Dg Chest 2 View  09/03/2012   *RADIOLOGY REPORT*  Clinical Data: Stroke, diabetes  CHEST - 2 VIEW  Comparison: 01/06/2012  Findings: Better lung volumes than the prior study.  Normal heart size and vascularity.  No focal pneumonia, collapse, consolidation, edema, effusion, or pneumothorax.  Degenerative changes of the spine and atherosclerosis of the aorta.  IMPRESSION: No acute chest process   Original Report Authenticated By: Judie Petit. Miles Costain, M.D.    Ct Head Wo Contrast  09/03/2012  *RADIOLOGY REPORT*  Clinical Data: Right-sided weakness.  CT HEAD WITHOUT CONTRAST  Technique:  Contiguous axial images were obtained from the base of the skull through the vertex without contrast.  Comparison: 01/06/2012.  MRI 01/07/2012.  Findings: There is atrophy and chronic small vessel disease changes. Old bilateral basal ganglia infarcts. No acute intracranial abnormality.  Specifically, no hemorrhage, hydrocephalus, mass lesion, acute infarction, or significant intracranial injury.  No acute calvarial abnormality. Visualized paranasal sinuses and mastoids clear.  Orbital soft tissues unremarkable.  IMPRESSION: No acute intracranial abnormality.  Atrophy, chronic microvascular disease.   Original Report Authenticated By: Charlett Nose, M.D.     Medications:  I have reviewed the patient's current medications. Prior to Admission:  Prescriptions prior to admission  Medication Sig Dispense Refill  . amLODipine-benazepril (LOTREL) 5-10 MG per capsule Take 1 capsule by mouth daily.      Marland Kitchen dipyridamole-aspirin (AGGRENOX) 25-200 MG per 12  hr capsule Take 1 capsule by mouth 2 (two) times daily.      . metFORMIN (GLUCOPHAGE) 500 MG tablet Take 1,000 mg by mouth 2 (two) times daily with a meal.      . pantoprazole (PROTONIX) 40 MG tablet Take 40 mg by mouth every evening.      . rosuvastatin (CRESTOR) 5 MG tablet Take 5 mg by mouth every Monday, Wednesday, and Friday.      . sodium chloride (BRONCHO SALINE) inhaler solution Take 2 sprays by nebulization as  needed.       Scheduled:   . enoxaparin (LOVENOX) injection  40 mg Subcutaneous Q24H  . insulin aspart  0-9 Units Subcutaneous Q4H   Continuous:  [id number]  Assessment/Plan: 1) acute on chronic right sided weakness: CT negative for bleed or acute change. MRI was done earlier this am, but is not yet read.  Neurology has been consulted and plan is for repeat CVA work up if new stroke has occurred.  Family reports good compliance with aggrenox and other medications. 2) Diabetes: continue metformin 3) hypertension: controled. Hold amlodipine for now until MRI report returns. Allow for higher BP's in the setting of possible recent CVA. 4) HLD: continue crestor 5) GERD: contiue protonix 6) dispo: await MRI results.  Will likely need PT evaluation   LOS: 0 days   Beauford Lando 09/03/2012, 3:33 PM

## 2012-09-04 ENCOUNTER — Observation Stay (HOSPITAL_COMMUNITY): Payer: Medicare Other

## 2012-09-04 DIAGNOSIS — I6350 Cerebral infarction due to unspecified occlusion or stenosis of unspecified cerebral artery: Principal | ICD-10-CM

## 2012-09-04 LAB — LIPID PANEL
Cholesterol: 117 mg/dL (ref 0–200)
HDL: 49 mg/dL (ref >39)
Triglycerides: 65 mg/dL (ref <150)
VLDL: 13 mg/dL (ref 0–40)

## 2012-09-04 LAB — CBC
MCH: 30.5 pg (ref 26.0–34.0)
Platelets: 293 10*3/uL (ref 150–400)
RBC: 3.25 MIL/uL — ABNORMAL LOW (ref 3.87–5.11)

## 2012-09-04 LAB — BASIC METABOLIC PANEL
CO2: 27 mEq/L (ref 19–32)
Calcium: 10.3 mg/dL (ref 8.4–10.5)
GFR calc non Af Amer: 76 mL/min — ABNORMAL LOW (ref >90)
Potassium: 4.3 mEq/L (ref 3.5–5.1)
Sodium: 140 mEq/L (ref 135–145)

## 2012-09-04 LAB — HEMOGLOBIN A1C
Hgb A1c MFr Bld: 6.5 % — ABNORMAL HIGH (ref <5.7)
Mean Plasma Glucose: 140 mg/dL — ABNORMAL HIGH (ref <117)

## 2012-09-04 LAB — GLUCOSE, CAPILLARY: Glucose-Capillary: 157 mg/dL — ABNORMAL HIGH (ref 70–99)

## 2012-09-04 MED ORDER — IOHEXOL 350 MG/ML SOLN
80.0000 mL | Freq: Once | INTRAVENOUS | Status: AC | PRN
  Administered 2012-09-04: 50 mL via INTRAVENOUS

## 2012-09-04 NOTE — Evaluation (Signed)
Physical Therapy Evaluation Patient Details Name: Megan [name] MRN: [id number] DOB: May 20, 1922 Today's Date: 09/04/2012 Time: 4098-1191 PT Time Calculation (min): 47 min  PT Assessment / Plan / Recommendation Clinical Impression  79 yo adm with incr Rt sided weakness (h/o prior CVA). Pt unable to tolerate MRI of head, CT head negative. Family reports Rt side has improved, however pt not yet back to her recent baseline (could transfer and walk with +1 assist). They do feel they can manage at home with HHPT (they can use w/c instead of ambulate, as needed). Very supportive family with son and daughter present and assisting during evalution.    PT Assessment  Patient needs continued PT services    Follow Up Recommendations  Home health PT;Supervision/Assistance - 24 hour    Does the patient have the potential to tolerate intense rehabilitation      Barriers to Discharge None      Equipment Recommendations  None recommended by PT    Recommendations for Other Services OT consult   Frequency Min 3X/week    Precautions / Restrictions Precautions Precautions: Fall   Pertinent Vitals/Pain Reported pain/stiffness in her back upon sitting EOB; performed seated trunk flexion/extension during rest period with reported decr pain      Mobility  Bed Mobility Bed Mobility: Supine to Sit;Sitting - Scoot to Edge of Bed;Sit to Sidelying Left Supine to Sit: 3: Mod assist;With rails;HOB elevated Sitting - Scoot to Edge of Bed: 2: Max assist Sit to Sidelying Left: 3: Mod assist;HOB flat Details for Bed Mobility Assistance: Pt required assist PTA; ? incr assist needed due to incr stiffness/soreness (daughter reports not OOB/moving since Sat night) Transfers Transfers: Sit to Stand;Stand to Sit Sit to Stand: 1: +2 Total assist;With upper extremity assist;From bed;From chair/3-in-1 Sit to Stand: Patient Percentage: 50% Stand to Sit: 1: +2 Total assist;Without upper extremity assist;To bed;To  chair/3-in-1 Stand to Sit: Patient Percentage: 50% Details for Transfer Assistance: Pt pushes posteriorly and leans to Lt as coming to standing; transfer x 2; on 2nd transfer, daughter demonstrated their technique (she stands in front of pt, her hands reaching over pts shoulders and on pts upper back, pt pushes posteriorly against her hands as she counterbalances pt with her weight as pt comes to stand); pt fatigued and trying to sit prior to fully turning around and reaching back to surface Ambulation/Gait Ambulation/Gait Assistance: 1: +2 Total assist Ambulation/Gait: Patient Percentage: 60% Ambulation Distance (Feet): 14 Feet (7 ft x2 (seated rest)) Assistive device: Rolling walker Ambulation/Gait Assistance Details: Pt's son assisted with ambulation; son gives excellent cues for sequencing, upright posture. Pt cued and assisted to take larger steps with RLE (does not have AFO here at hospital, although daughter reports she often does not use it at home) Gait Pattern: Step-to pattern;Decreased step length - right;Decreased hip/knee flexion - right;Decreased weight shift to right;Right genu recurvatum;Lateral trunk lean to left;Trunk flexed Modified Rankin (Stroke Patients Only) Pre-Morbid Rankin Score: Moderately severe disability Modified Rankin: Moderately severe disability         Exercises General Exercises - Upper Extremity Shoulder Flexion: AAROM;Right;10 reps;Seated Shoulder Horizontal ADduction: AAROM;Right;5 reps;Seated Elbow Extension: PROM;Right;5 reps;Seated   PT Diagnosis: Difficulty walking;Generalized weakness  PT Problem List: Decreased strength;Decreased range of motion;Decreased activity tolerance;Decreased balance;Decreased mobility;Decreased knowledge of use of DME;Decreased safety awareness;Impaired tone PT Treatment Interventions: DME instruction;Gait training;Functional mobility training;Therapeutic activities;Balance training;Neuromuscular re-education;Patient/family  education   PT Goals Acute Rehab PT Goals PT Goal Formulation: With patient/family Time For Goal Achievement: 09/11/12 Potential to  Achieve Goals: Good Pt will go Supine/Side to Sit: with min assist;with HOB 0 degrees PT Goal: Supine/Side to Sit - Progress: Goal set today Pt will go Sit to Supine/Side: with min assist;with HOB 0 degrees PT Goal: Sit to Supine/Side - Progress: Goal set today Pt will go Sit to Stand: with min assist;with upper extremity assist PT Goal: Sit to Stand - Progress: Goal set today Pt will go Stand to Sit: with min assist;with upper extremity assist PT Goal: Stand to Sit - Progress: Goal set today Pt will Transfer Bed to Chair/Chair to Bed: with min assist PT Transfer Goal: Bed to Chair/Chair to Bed - Progress: Goal set today Pt will Stand: with min assist;1 - 2 min;with unilateral upper extremity support PT Goal: Stand - Progress: Goal set today Pt will Ambulate: 1 - 15 feet;with min assist;with rolling walker PT Goal: Ambulate - Progress: Goal set today  Visit Information  Last PT Received On: 09/04/12 Assistance Needed: +2    Subjective Data  Subjective: "I want to go home" Patient Stated Goal: return home with family's assist (as PTA)   Prior Functioning  Home Living Lives With: Daughter Available Help at Discharge: Family;Available 24 hours/day (2 daughters and at least 1 son help with her care) Type of Home: House Home Access: Stairs to enter [employer] of Steps: 1 (low threshold) Entrance Stairs-Rails: None Home Layout: One level Home Adaptive Equipment: Bedside commode/3-in-1;Walker - rolling;Wheelchair - manual Additional Comments: uses RW with bed to [facility] transfers; walks short distances with RW; W/c for going out  Prior Function Level of Independence: Needs assistance Needs Assistance: Toileting;Gait;Transfers Transfer Assistance: +1 min to mod (leans post and Lt per daughter) Able to Take Stairs?: Yes (sometimes does  threshold with RW with assist) Driving: No Comments: son and daughter present throughout eval and assisted Communication Communication: HOH;Expressive difficulties    Cognition  Overall Cognitive Status: Appears within functional limits for tasks assessed/performed (difficult to fully assess due to severe HOH) Arousal/Alertness: Awake/alert Behavior During Session: [facility] for tasks performed    Extremity/Trunk Assessment Right Upper Extremity Assessment RUE ROM/Strength/Tone: Deficits RUE ROM/Strength/Tone Deficits: AAROM shoulder flexion to 110, flexor spasticity in biceps with -15 elbow extension Left Upper Extremity Assessment LUE ROM/Strength/Tone: Deficits LUE ROM/Strength/Tone Deficits: AROM grossly WFL; strength grossly 3+ to 4 Right Lower Extremity Assessment RLE ROM/Strength/Tone: Deficits RLE ROM/Strength/Tone Deficits: AAROM WFL except ankle to -10 dorsiflexion (slight PF contracture); strength grossly 2+ to 3- Left Lower Extremity Assessment LLE ROM/Strength/Tone: Deficits LLE ROM/Strength/Tone Deficits: AROM WFL; strength grossly 3+ to 4 Trunk Assessment Trunk Assessment: Kyphotic   Balance Balance Balance Assessed: Yes Static Sitting Balance Static Sitting - Balance Support: Bilateral upper extremity supported;Feet supported Static Sitting - Level of Assistance: 5: Stand by assistance Static Standing Balance Static Standing - Balance Support: Bilateral upper extremity supported Static Standing - Level of Assistance: 1: +2 Total assist Static Standing - Comment/# of Minutes: leans Lt and post  End of Session PT - End of Session Equipment Utilized During Treatment: Gait belt Activity Tolerance: Patient limited by fatigue Patient left: in bed;with call bell/phone within reach;with bed alarm set;with family/visitor present Nurse Communication: Mobility status;Other (comment) (RN requested back to bed--going for test)  GP Functional Assessment Tool Used: clinical  judgment Functional Limitation: Mobility: Walking and moving around Mobility: Walking and Moving Around Current Status [phone]): At least 40 percent but less than 60 percent impaired, limited or restricted Mobility: Walking and Moving Around Goal Status [phone]): At least 20 percent  but less than 40 percent impaired, limited or restricted   Jeremaih Klima 09/04/2012, 12:29 PM  Pager [phone]

## 2012-09-04 NOTE — Progress Notes (Signed)
Stroke Team Progress Note  HISTORY Megan [name] is a 79 y.o. female with a history of previous stroke in 2012 who awoke  Tonight 09/03/2012 with worsened right sided weakness. She is normally able to walk with a walker, but was unable to tonight. Her family was concerned and therefore she was brought in for evaluation. She denies any pain or recent illness. She was last seen normal 09/02/2012 at 930p. Patient was not a TPA candidate secondary to unknown time of onset. She was admitted for further evaluation and treatment.  SUBJECTIVE Her daughter is at the bedside.  Overall she feels her condition is unchanged.   OBJECTIVE Most recent Vital Signs: Filed Vitals:   09/03/12 2200 09/04/12 0200 09/04/12 0300 09/04/12 0600  BP: 108/49 113/65  129/50  Pulse: 89 76  87  Temp: 98.1 F (36.7 C) 99 F (37.2 C)  99.1 F (37.3 C)  TempSrc: Oral Oral  Oral  Resp: 16 16  16   Height:      Weight:   54.795 kg (120 lb 12.8 oz)   SpO2: 100% 99%  97%   CBG (last 3)   Basename 09/04/12 0734 09/03/12 2059 09/03/12 1644  GLUCAP 123* 143* 103*    IV Fluid Intake:     MEDICATIONS    . atorvastatin  40 mg Oral q1800  . dipyridamole-aspirin  1 capsule Oral BID  . enoxaparin (LOVENOX) injection  40 mg Subcutaneous Q24H  . LORazepam  0.5 mg Intravenous Once  . metFORMIN  1,000 mg Oral BID WC  . pantoprazole  40 mg Oral QPM   PRN:  senna-docusate  Diet:  Carb Control thin liquids Activity:  OOB with assistance DVT Prophylaxis:  Lovenox 40 mg sq daily   CLINICALLY SIGNIFICANT STUDIES Basic Metabolic Panel:  Lab 09/04/12 1610 09/03/12 0327 09/03/12 0307  NA 140 140 --  K 4.3 5.0 --  CL 101 103 --  CO2 27 -- 26  GLUCOSE 136* 124* --  BUN 11 18 --  CREATININE 0.67 0.90 --  CALCIUM 10.3 -- 10.6*  MG -- -- --  PHOS -- -- --   Liver Function Tests:  Lab 09/03/12 0307  AST 13  ALT 8  ALKPHOS 68  BILITOT 0.2*  PROT 7.0  ALBUMIN 3.4*   CBC:  Lab 09/04/12 0640 09/03/12 0327 09/03/12 0307    WBC 6.7 -- 8.1  NEUTROABS -- -- 5.2  HGB 9.9* 9.9* --  HCT 30.2* 29.0* --  MCV 92.9 -- 93.0  PLT 293 -- 290   Coagulation:  Lab 09/03/12 0307  LABPROT 12.9  INR 0.98   Cardiac Enzymes:  Lab 09/03/12 0308  CKTOTAL --  CKMB --  CKMBINDEX --  TROPONINI <0.30   Urinalysis:  Lab 09/03/12 0311  COLORURINE YELLOW  LABSPEC 1.019  PHURINE 6.5  GLUCOSEU NEGATIVE  HGBUR NEGATIVE  BILIRUBINUR NEGATIVE  KETONESUR 15*  PROTEINUR NEGATIVE  UROBILINOGEN 0.2  NITRITE NEGATIVE  LEUKOCYTESUR NEGATIVE   Lipid Panel    Component Value Date/Time   CHOL 117 09/04/2012 0640   TRIG 65 09/04/2012 0640   HDL 49 09/04/2012 0640   CHOLHDL 2.4 09/04/2012 0640   VLDL 13 09/04/2012 0640   LDLCALC 55 09/04/2012 0640   HgbA1C  Lab Results  Component Value Date   HGBA1C 5.9* 01/07/2012    Urine Drug Screen:     Component Value Date/Time   LABOPIA NONE DETECTED 01/06/2012 2306   COCAINSCRNUR NONE DETECTED 01/06/2012 2306   LABBENZ NONE DETECTED 01/06/2012  2306   AMPHETMU NONE DETECTED 01/06/2012 2306   THCU NONE DETECTED 01/06/2012 2306   LABBARB NONE DETECTED 01/06/2012 2306    Alcohol Level: No results found for this basename: ETH:2 in the last 168 hours  CT of the brain  09/03/2012  No acute intracranial abnormality.  Atrophy, chronic microvascular disease.     CT angio head  CT angio neck  MRI of the brain  Unable to tolerate  MRA of the brain  Unable to tolerate  2D Echocardiogram    Carotid Doppler  See CT angio neck  CXR  09/03/2012  No acute chest process    EKG  normal sinus rhythm.   Therapy Recommendations   Physical Exam   Pleasant elderly lady not in distress.Awake alert. Afebrile. Head is nontraumatic. Neck is supple without bruit. Hearing is normal. Cardiac exam no murmur or gallop. Lungs are clear to auscultation. Distal pulses are well felt.  Neurological Exam ; awake alert oriented x3. Mild dysarthria. No aphasia or apraxia. Extraocular moments are full range  without nystagmus. Blinks to threat bilaterally. Fundi were not visualized. Vision acuity seems adequate. Tongue is midline. Mild right lower facial weakness. Motor system exam reveals dense spastic right hemiplegia. Right upper extremity strength grade 1/5. Right lower extremity strength grade 1-2/5. Increased tone with spasticity of the right. Sensation appears preserved bilaterally. Deep tendon reflexes are brisker on the right compared to the left. Right plantar is upgoing. Left is downgoing. Gait was not tested. ASSESSMENT Megan [name] is a 79 y.o. female who woke with worsening right hemiparesis. ? New stroke vs seizure. Unable to tolerate MRI yesterday, so it was cancelled.  Work up underway. On dipyridamole SR 250 mg/aspirin 25 mg orally twice a day prior to admission. Now on dipyridamole SR 250 mg/aspirin 25 mg orally twice a day for secondary stroke prevention. Patient with resultant spastic right hemiparesis.   Hx left pontine stroke 2012 secondary to small vessel disease with resultant  right hemiparesis  Hx TIAs with left hemiparesis prior to 2012 stroke  Hx partial complex seizures, Last Seizure 3-4 mo ago, was on keppra, stopped for unclear reason Diabetes, HgbA1c pending Hyperlipidemia, LDL 55, on statin PTA, on crestor now, goal LDL < 100 Hypertension   Hospital day # 1  TREATMENT/PLAN  Continue dipyridamole SR 250 mg/aspirin 25 mg orally twice a day for secondary stroke prevention.  CTA head and neck  Check EEG, may need to resume Keppra  Therapy evals  Annie Main, MSN, RN, ANVP-BC, ANP-BC, GNP-BC [facility] Pager: [phone] 09/04/2012 8:28 AM  I have personally obtained a history, examined the patient, evaluated imaging results, and formulated the assessment and plan of care. I agree with the above.  Delia Heady, MD Medical Director [facility] Pager: [phone] 09/04/2012 6:44 PM

## 2012-09-04 NOTE — Progress Notes (Signed)
Utilization review completed.  

## 2012-09-04 NOTE — Progress Notes (Signed)
Subjective: No new complaints  Objective: Vital signs in last 24 hours: Temp:  [97 F (36.1 C)-99 F (37.2 C)] 99 F (37.2 C) (01/13 0200) Pulse Rate:  [76-95] 76  (01/13 0200) Resp:  [14-18] 16  (01/13 0200) BP: (89-143)/(34-78) 113/65 mmHg (01/13 0200) SpO2:  [94 %-100 %] 99 % (01/13 0200) Weight:  [54.795 kg (120 lb 12.8 oz)-55.1 kg (121 lb 7.6 oz)] 54.795 kg (120 lb 12.8 oz) (01/13 0300) Weight change:  Last BM Date: 09/02/12  Intake/Output from previous day: 01/12 0701 - 01/13 0700 In: -  Out: 900 [Urine:900] Intake/Output this shift: Total I/O In: -  Out: 500 [Urine:500]  General appearance: alert Resp: clear to auscultation bilaterally Cardio: regular rate and rhythm, S1, S2 normal, no murmur, click, rub or gallop Neurologic: Motor: difficult to assess, can move R side  Lab Results:  Basename 09/03/12 0327 09/03/12 0307  WBC -- 8.1  HGB 9.9* 9.5*  HCT 29.0* 29.3*  PLT -- 290   BMET  Basename 09/03/12 0327 09/03/12 0307  NA 140 136  K 5.0 4.9  CL 103 97  CO2 -- 26  GLUCOSE 124* 127*  BUN 18 17  CREATININE 0.90 0.97  CALCIUM -- 10.6*    Studies/Results: Dg Chest 2 View  09/03/2012  *RADIOLOGY REPORT*  Clinical Data: Stroke, diabetes  CHEST - 2 VIEW  Comparison: 01/06/2012  Findings: Better lung volumes than the prior study.  Normal heart size and vascularity.  No focal pneumonia, collapse, consolidation, edema, effusion, or pneumothorax.  Degenerative changes of the spine and atherosclerosis of the aorta.  IMPRESSION: No acute chest process   Original Report Authenticated By: Judie Petit. Miles Costain, M.D.    Ct Head Wo Contrast  09/03/2012  *RADIOLOGY REPORT*  Clinical Data: Right-sided weakness.  CT HEAD WITHOUT CONTRAST  Technique:  Contiguous axial images were obtained from the base of the skull through the vertex without contrast.  Comparison: 01/06/2012.  MRI 01/07/2012.  Findings: There is atrophy and chronic small vessel disease changes. Old bilateral basal  ganglia infarcts. No acute intracranial abnormality.  Specifically, no hemorrhage, hydrocephalus, mass lesion, acute infarction, or significant intracranial injury.  No acute calvarial abnormality. Visualized paranasal sinuses and mastoids clear.  Orbital soft tissues unremarkable.  IMPRESSION: No acute intracranial abnormality.  Atrophy, chronic microvascular disease.   Original Report Authenticated By: Charlett Nose, M.D.     Medications: I have reviewed the patient's current medications.  Assessment/Plan: Principal Problem:  *CVA (cerebral infarction) possible CVA vs TIA.  Apparently unable to do MRI yesterday despite ativan,and patient does not want repeat today.  I don't think MRI likely to change treatment, will cancel.  Have PT an OT see patient, continue medical therapy. Passed swallow eval. Active Problems:  Diabetes mellitus OK on metformin  HTN (hypertension) BP OK off lotrel   LOS: 1 day   Janessa Mickle [name] 09/04/2012, 5:54 AM

## 2012-09-05 ENCOUNTER — Observation Stay (HOSPITAL_COMMUNITY): Payer: Medicare Other

## 2012-09-05 LAB — GLUCOSE, CAPILLARY: Glucose-Capillary: 122 mg/dL — ABNORMAL HIGH (ref 70–99)

## 2012-09-05 NOTE — Progress Notes (Signed)
Evening dose of lipitor held on 09/05/12. Daughter stated that pt takes 5mg  of crestor three times a WEEK. Dr. Valentina Lucks made this medication several years ago according to the daughter.   Heron Sabins, SN [facility]

## 2012-09-05 NOTE — Progress Notes (Signed)
Physical Therapy Treatment Patient Details Name: Megan [name] MRN: [id number] DOB: Feb 14, 1922 Today's Date: 09/05/2012 Time: 7829-5621 PT Time Calculation (min): 39 min  PT Assessment / Plan / Recommendation Comments on Treatment Session  Pt needs more assist than her daughter may be able to provide on a sustained basis unless she gets more help.  Pt could benefit from some rehab to strengthen up before D/C home.    Follow Up Recommendations  Other (comment) (SNF possibly (dtr wants to talk with CM/SW))     Does the patient have the potential to tolerate intense rehabilitation     Barriers to Discharge        Equipment Recommendations  None recommended by PT    Recommendations for Other Services Rehab consult  Frequency Min 3X/week   Plan Discharge plan remains appropriate;Frequency remains appropriate    Precautions / Restrictions Precautions Precautions: Fall   Pertinent Vitals/Pain     Mobility  Bed Mobility Bed Mobility: Supine to Sit Supine to Sit: 3: Mod assist;HOB elevated;With rails Sitting - Scoot to Edge of Bed: 3: Mod assist;With rail Details for Bed Mobility Assistance: vc/tc's for hand placement and facil to build forward momentum Transfers Transfers: Sit to Stand;Stand to Sit Sit to Stand: 1: +2 Total assist;With upper extremity assist;From bed;From chair/3-in-1 Sit to Stand: Patient Percentage: 50% Stand to Sit: 1: +2 Total assist;With upper extremity assist;To chair/3-in-1 Stand to Sit: Patient Percentage: 50% Details for Transfer Assistance: pt tends to push posteriorly. Needs lots of verbal and tacticle cues for hand placement, weight shift and LE placement also. Pt forward flexed in standing. Needs assist to rise and stabilize as well as control descent to chair.  Ambulation/Gait Ambulation/Gait Assistance: 1: +2 Total assist Ambulation/Gait: Patient Percentage: 60% (until fatigue and then needed more assist pt=40%) Ambulation Distance (Feet): 14 Feet  (then 5 feet from chair to chair plus turn to sit) Assistive device: Rolling walker Ambulation/Gait Assistance Details: Gait best described as hemiparetic with decr ability w/shift.  Flexed posture that sags as she fatigues Gait Pattern: Step-to pattern;Step-through pattern;Decreased step length - right;Decreased stance time - left;Decreased stride length;Trunk flexed (hemiparetic) Modified Rankin (Stroke Patients Only) Modified Rankin: Moderately severe disability    Exercises     PT Diagnosis:    PT Problem List:   PT Treatment Interventions:     PT Goals Acute Rehab PT Goals PT Goal Formulation: With patient/family Time For Goal Achievement: 09/11/12 Potential to Achieve Goals: Good PT Goal: Supine/Side to Sit - Progress: Progressing toward goal PT Goal: Sit to Supine/Side - Progress: Progressing toward goal PT Goal: Sit to Stand - Progress: Progressing toward goal PT Goal: Stand to Sit - Progress: Progressing toward goal PT Transfer Goal: Bed to Chair/Chair to Bed - Progress: Progressing toward goal PT Goal: Stand - Progress: Progressing toward goal PT Goal: Ambulate - Progress: Progressing toward goal  Visit Information  Last PT Received On: 09/05/12 Assistance Needed: +2    Subjective Data  Subjective: Did you walk [place] Dec?   Cognition  Overall Cognitive Status: Difficult to assess Arousal/Alertness: Awake/alert Behavior During Session: [facility] for tasks performed    Balance  Static Sitting Balance Static Sitting - Balance Support: Bilateral upper extremity supported;Feet supported Static Sitting - Level of Assistance: 5: Stand by assistance Static Standing Balance Static Standing - Balance Support: Bilateral upper extremity supported Static Standing - Level of Assistance: 2: Max assist;3: Mod assist  End of Session PT - End of Session Activity Tolerance: Patient limited by fatigue Patient  left: in bed;with call bell/phone within reach;with bed alarm set;with  family/visitor present Nurse Communication: Mobility status   GP     Olympia Adelsberger, Eliseo Gum 09/05/2012, 3:18 PM  09/05/2012  [place] Bing, PT [phone] (pager)

## 2012-09-05 NOTE — Evaluation (Signed)
Occupational Therapy Evaluation Patient Details Name: Megan [name] MRN: [id number] DOB: Mar 04, 1922 Today's Date: 09/05/2012 Time: 8657-8469 OT Time Calculation (min): 42 min  OT Assessment / Plan / Recommendation Clinical Impression  79 yo adm with incr Rt sided weakness (h/o prior CVA). Pt unable to tolerate MRI of head, CT head negative. Family reports Rt side has improved, however pt not yet back to her recent baseline (could transfer and walk with +1 assist). Pt's daughter interested in exploring SNF option but initially preferring home with [facility] therapy. Will request social work consult.     OT Assessment  Patient needs continued OT Services    Follow Up Recommendations  Possibly SNF (daughter interested in speaking to social worker versus ;Supervision/Assistance - 24 hour;Home health OT and an aide)   Barriers to Discharge      Equipment Recommendations  None recommended by OT    Recommendations for Other Services    Frequency  Min 2X/week    Precautions / Restrictions Precautions Precautions: Fall        ADL  Eating/Feeding: Simulated;Supervision/safety;Set up;Other (comment) (much increased time) Where Assessed - Eating/Feeding: Bed level Grooming: Simulated;Wash/dry face;Supervision/safety;Set up Where Assessed - Grooming: Supine, head of bed up Upper Body Bathing: Simulated;Chest;Right arm;Left arm;Abdomen;Maximal assistance Where Assessed - Upper Body Bathing: Unsupported sitting Lower Body Bathing: Simulated;+2 Total assistance Lower Body Bathing: Patient Percentage: 20% Where Assessed - Lower Body Bathing: Supported sit to stand Upper Body Dressing: Simulated;+1 Total assistance Where Assessed - Upper Body Dressing: Unsupported sitting Lower Body Dressing: Simulated;+2 Total assistance Lower Body Dressing: Patient Percentage: 0% Where Assessed - Lower Body Dressing: Supported sit to stand Toilet Transfer: Simulated;+2 Total assistance Toilet Transfer: Patient  Percentage: 40% (pt 50% sit to stand and fluctuates 40-60% with steps) Toileting - Clothing Manipulation and Hygiene: Simulated;+2 Total assistance Toileting - Architect and Hygiene: Patient Percentage: 0% Where Assessed - Glass blower/designer Manipulation and Hygiene: Standing Tub/Shower Transfer Method: Not assessed Equipment Used: Rolling walker ADL Comments: Daughter present for session. Pt doesnt verbalize much and is very HOH and difficult to understand. She hears better out of L ear. Pt fluctuates with ability to take steps across room, with more difficulty at first then did better with increased distance but then fatigues and requires more assist. Leans posteriorly. Pt overrelies on L UE for most activity which daughter states has been since her last stroke. Encouraged pt to use R UE to help with reaching for chair, pushing on bed to scoot, etc. Also educated daughter on performing AAROM exercises with UEs especially the R to keep from getting stiff adn weaker.    OT Diagnosis: Generalized weakness  OT Problem List: Decreased strength;Decreased range of motion;Decreased activity tolerance;Decreased knowledge of use of DME or AE;Impaired balance (sitting and/or standing) OT Treatment Interventions: Self-care/ADL training;Therapeutic activities;DME and/or AE instruction;Patient/family education   OT Goals Acute Rehab OT Goals OT Goal Formulation: With family Time For Goal Achievement: 09/19/12 Potential to Achieve Goals: Good ADL Goals Pt Will Perform Grooming: with min assist;Sitting, edge of bed;Sitting, chair;Unsupported ADL Goal: Grooming - Progress: Goal set today Pt Will Transfer to Toilet: with mod assist;with DME;Stand pivot transfer;3-in-1 ADL Goal: Toilet Transfer - Progress: Goal set today Pt Will Perform Toileting - Clothing Manipulation: with mod assist;Standing ADL Goal: Toileting - Clothing Manipulation - Progress: Goal set today Additional ADL Goal #1: Pt will  maintain standing balance with mod assist for 2 minutes in order for caregiver to help with LB bathing/dressing task. ADL Goal: Additional Goal #  1 - Progress: Goal set today  Visit Information  Last OT Received On: 09/05/12 Assistance Needed: +2 PT/OT Co-Evaluation/Treatment: Yes    Subjective Data  Subjective: pt states "ok" when asked about getting up Patient Stated Goal: family goal is for pt to get stronger. pt didnt state. she doesnt verbalize much   Prior Functioning     Home Living Lives With: Daughter Available Help at Discharge: Family;Available 24 hours/day;Personal care attendant;Other (Comment) (2 daughters and at least 1 son help with her care) Type of Home: House Home Access: Stairs to enter [employer] of Steps: 1 (low threshold) Entrance Stairs-Rails: None Home Layout: One level Bathroom Shower/Tub: Engineer, manufacturing systems: Standard Home Adaptive Equipment: Bedside commode/3-in-1;Walker - rolling;Wheelchair - manual;Shower chair with back Additional Comments: uses RW with bed to [facility] transfers; walks short distances with RW; W/c for going out . Daughter states aide helps her onto [facility] in bedroom but daughter will walk her in the bathroom where it is a tighter space to use 3in1 over toilet. Aide is there M-F 7-1:30 pm Prior Function Level of Independence: Needs assistance Needs Assistance: Toileting;Gait;Transfers;Bathing;Dressing;Feeding;Meal Prep;Light Housekeeping Bath: Moderate Dressing: Maximal Feeding: Supervision/set-up Toileting: Moderate Meal Prep: Total Light Housekeeping: Total Transfer Assistance: +1 min to mod (leans post and Lt per daughter) Daugther states pt can wash her upper body and private areas, needs help with legs/feet and can don UB clothing but needs assist with LB dressing Able to Take Stairs?: Yes (sometimes does threshold with RW with assist) Driving: No Communication Communication: HOH;Expressive  difficulties Dominant Hand: Left (was R handed but has compensated since stroke using L)         Vision/Perception     Cognition  Overall Cognitive Status: Difficult to assess Arousal/Alertness: Awake/alert Behavior During Session: [facility] for tasks performed    Extremity/Trunk Assessment Right Upper Extremity Assessment RUE ROM/Strength/Tone: Deficits RUE ROM/Strength/Tone Deficits: AAROM shoulder flexion to 110 then resistance noted. Encouraged caregiver to perform AAROM. Note pt not able to fully extend elbow. overall weakness noted in wrist and grip also. Difficult to fully assess due to pt [facility] also. Left Upper Extremity Assessment LUE ROM/Strength/Tone: Deficits LUE ROM/Strength/Tone Deficits: AROM grossly WFL but strength around 3+ throughout     Mobility Bed Mobility Bed Mobility: Supine to Sit Supine to Sit: 3: Mod assist;HOB elevated;With rails Sitting - Scoot to Edge of Bed: 3: Mod assist;With rail Transfers Transfers: Sit to Stand;Stand to Sit Sit to Stand: 1: +2 Total assist;With upper extremity assist;From bed;From chair/3-in-1 Sit to Stand: Patient Percentage: 50% Stand to Sit: 1: +2 Total assist;With upper extremity assist;To chair/3-in-1 Stand to Sit: Patient Percentage: 50% Details for Transfer Assistance: pt tends to push posteriorly. Needs lots of verbal and tacticle cues for hand placement, weight shift and LE placement also. Pt forward flexed in standing. Needs assist to rise and stabilize as well as control descent to chair.      Shoulder Instructions     Exercise     Balance     End of Session OT - End of Session Activity Tolerance: Patient limited by fatigue;Other (comment) (weakness) Patient left: in chair;with call bell/phone within reach;with family/visitor present  GO Functional Assessment Tool Used: clinical judgement Functional Limitation: Self care Self Care Current Status (Z6109): At least 80 percent but less than 100 percent impaired,  limited or restricted Self Care Goal Status (U0454): At least 40 percent but less than 60 percent impaired, limited or restricted   Lennox Laity [phone] 09/05/2012, 2:55  PM

## 2012-09-05 NOTE — Progress Notes (Signed)
Subjective: Urinary frequence and incontinence  Objective: Vital signs in last 24 hours: Temp:  [98 F (36.7 C)-98.9 F (37.2 C)] 98.9 F (37.2 C) (01/14 0000) Pulse Rate:  [84-101] 99  (01/14 0000) Resp:  [16-18] 16  (01/14 0000) BP: (104-130)/(65-74) 117/72 mmHg (01/14 0000) SpO2:  [98 %-100 %] 98 % (01/14 0000) Weight change:  Last BM Date: 09/02/12  Intake/Output from previous day: 01/13 0701 - 01/14 0700 In: 0  Out: 600 [Urine:600] Intake/Output this shift: Total I/O In: -  Out: 300 [Urine:300]  General appearance: alert Resp: clear to auscultation bilaterally Cardio: regular rate and rhythm, S1, S2 normal, no murmur, click, rub or gallop Neurologic: Motor: decreased strength RUE and RLE  Lab Results:  Basename 09/04/12 0640 09/03/12 0327 09/03/12 0307  WBC 6.7 -- 8.1  HGB 9.9* 9.9* --  HCT 30.2* 29.0* --  PLT 293 -- 290   BMET  Basename 09/04/12 0640 09/03/12 0327 09/03/12 0307  NA 140 140 --  K 4.3 5.0 --  CL 101 103 --  CO2 27 -- 26  GLUCOSE 136* 124* --  BUN 11 18 --  CREATININE 0.67 0.90 --  CALCIUM 10.3 -- 10.6*    Studies/Results: Dg Chest 2 View  09/03/2012  *RADIOLOGY REPORT*  Clinical Data: Stroke, diabetes  CHEST - 2 VIEW  Comparison: 01/06/2012  Findings: Better lung volumes than the prior study.  Normal heart size and vascularity.  No focal pneumonia, collapse, consolidation, edema, effusion, or pneumothorax.  Degenerative changes of the spine and atherosclerosis of the aorta.  IMPRESSION: No acute chest process   Original Report Authenticated By: Judie Petit. Miles Costain, M.D.     Medications: I have reviewed the patient's current medications.  Assessment/Plan: Principal Problem:  *CVA (cerebral infarction) possible CVA vs TIA. CTA pending. PT noted,   OT to see patient, continue medical therapy. Passed swallow eval. EEG today. Active Problems:  Diabetes mellitus OK on metformin  HTN (hypertension) BP OK off lotrel Urinary incontinence, check  UA Disposition, hope to discharge in am with home PT   LOS: 2 days   Kumari Sculley [name] 09/05/2012, 6:03 AM

## 2012-09-05 NOTE — Progress Notes (Signed)
EEG completed.

## 2012-09-05 NOTE — Progress Notes (Signed)
Stroke Team Progress Note  HISTORY Megan [name] is a 79 y.o. female with a history of previous stroke in 2012 who awoke  Tonight 09/03/2012 with worsened right sided weakness. She is normally able to walk with a walker, but was unable to tonight. Her family was concerned and therefore she was brought in for evaluation. She denies any pain or recent illness. She was last seen normal 09/02/2012 at 930p. Patient was not a TPA candidate secondary to unknown time of onset. She was admitted for further evaluation and treatment.  SUBJECTIVE Patient now moving her right arm more.  OBJECTIVE Most recent Vital Signs: Filed Vitals:   09/04/12 1600 09/04/12 2000 09/05/12 0000 09/05/12 0400  BP: 130/74 104/65 117/72 134/76  Pulse: 84 101 99 99  Temp: 98.4 F (36.9 C) 98 F (36.7 C) 98.9 F (37.2 C) 98.9 F (37.2 C)  TempSrc: Oral Oral Axillary Oral  Resp: 18 17 16 16   Height:      Weight:    55.112 kg (121 lb 8 oz)  SpO2: 98% 98% 98% 97%   CBG (last 3)   Basename 09/05/12 0728 09/04/12 2132 09/04/12 1146  GLUCAP 122* 157* 157*   IV Fluid Intake:     MEDICATIONS    . atorvastatin  40 mg Oral q1800  . dipyridamole-aspirin  1 capsule Oral BID  . enoxaparin (LOVENOX) injection  40 mg Subcutaneous Q24H  . LORazepam  0.5 mg Intravenous Once  . metFORMIN  1,000 mg Oral BID WC  . pantoprazole  40 mg Oral QPM   PRN:  senna-docusate  Diet:  Carb Control thin liquids Activity:  OOB with assistance DVT Prophylaxis:  Lovenox 40 mg sq daily   CLINICALLY SIGNIFICANT STUDIES Basic Metabolic Panel:   Lab 09/04/12 0640 09/03/12 0327 09/03/12 0307  NA 140 140 --  K 4.3 5.0 --  CL 101 103 --  CO2 27 -- 26  GLUCOSE 136* 124* --  BUN 11 18 --  CREATININE 0.67 0.90 --  CALCIUM 10.3 -- 10.6*  MG -- -- --  PHOS -- -- --   Liver Function Tests:   Lab 09/03/12 0307  AST 13  ALT 8  ALKPHOS 68  BILITOT 0.2*  PROT 7.0  ALBUMIN 3.4*   CBC:   Lab 09/04/12 0640 09/03/12 0327 09/03/12 0307  WBC  6.7 -- 8.1  NEUTROABS -- -- 5.2  HGB 9.9* 9.9* --  HCT 30.2* 29.0* --  MCV 92.9 -- 93.0  PLT 293 -- 290   Coagulation:   Lab 09/03/12 0307  LABPROT 12.9  INR 0.98   Cardiac Enzymes:   Lab 09/03/12 0308  CKTOTAL --  CKMB --  CKMBINDEX --  TROPONINI <0.30   Urinalysis:   Lab 09/03/12 0311  COLORURINE YELLOW  LABSPEC 1.019  PHURINE 6.5  GLUCOSEU NEGATIVE  HGBUR NEGATIVE  BILIRUBINUR NEGATIVE  KETONESUR 15*  PROTEINUR NEGATIVE  UROBILINOGEN 0.2  NITRITE NEGATIVE  LEUKOCYTESUR NEGATIVE   Lipid Panel    Component Value Date/Time   CHOL 117 09/04/2012 0640   TRIG 65 09/04/2012 0640   HDL 49 09/04/2012 0640   CHOLHDL 2.4 09/04/2012 0640   VLDL 13 09/04/2012 0640   LDLCALC 55 09/04/2012 0640   HgbA1C  Lab Results  Component Value Date   HGBA1C 6.5* 09/04/2012    Urine Drug Screen:     Component Value Date/Time   LABOPIA NONE DETECTED 01/06/2012 2306   COCAINSCRNUR NONE DETECTED 01/06/2012 2306   LABBENZ NONE DETECTED 01/06/2012 2306  AMPHETMU NONE DETECTED 01/06/2012 2306   THCU NONE DETECTED 01/06/2012 2306   LABBARB NONE DETECTED 01/06/2012 2306    Alcohol Level: No results found for this basename: ETH:2 in the last 168 hours  CT of the brain  09/03/2012  No acute intracranial abnormality.  Atrophy, chronic microvascular disease.     CT angio head 09/05/2012  50% stenosis right internal carotid artery origin.  Nonstenotic atherosclerotic change left carotid bifurcation.    CT angio neck  09/05/2012 Nonstenotic atherosclerotic change of the distal left vertebral and bilateral carotid siphons. Unchanged nonstenotic irregularity proximal ACA and MCA branches bilaterally.  No proximal intracranial flow reducing stenosis is observed.  Similar appearance to prior MRA.   MRI of the brain  Unable to tolerate  MRA of the brain  Unable to tolerate  2D Echocardiogram    Carotid Doppler  See CT angio neck  CXR  09/03/2012  No acute chest process    EKG  normal sinus  rhythm.   EEG   Therapy Recommendations home health PT  Physical Exam   Pleasant elderly lady not in distress.Awake alert. Afebrile. Head is nontraumatic. Neck is supple without bruit. Hearing is normal. Cardiac exam no murmur or gallop. Lungs are clear to auscultation. Distal pulses are well felt.  Neurological Exam ; awake alert oriented x3. Mild dysarthria. No aphasia or apraxia. Extraocular moments are full range without nystagmus. Blinks to threat bilaterally. Fundi were not visualized. Vision acuity seems adequate. Tongue is midline. Mild right lower facial weakness. Motor system exam reveals dense spastic right hemiplegia. Right upper extremity strength grade 3/5. Right lower extremity strength grade  2/5. Increased tone with spasticity of the right. Sensation appears preserved bilaterally. Deep tendon reflexes are brisker on the right compared to the left. Right plantar is upgoing. Left is downgoing. Gait was not tested.  ASSESSMENT Megan [name] is a 79 y.o. female who woke with worsening right hemiparesis. Not a new stroke, ? seizure.   Work up completed except for EEG. On dipyridamole SR 250 mg/aspirin 25 mg orally twice a day prior to admission. Now on dipyridamole SR 250 mg/aspirin 25 mg orally twice a day for secondary stroke prevention. Patient with resultant spastic right hemiparesis which is improving back to baseline.   Hx left pontine stroke 2012 secondary to small vessel disease with resultant  right hemiparesis  Hx TIAs with left hemiparesis prior to 2012 stroke  Hx partial complex seizures, Last Seizure 3-4 mo ago, was on keppra, stopped for unclear reason Diabetes, HgbA1c pending Hyperlipidemia, LDL 55, on statin PTA, on crestor now, goal LDL < 100 Hypertension  Hospital day # 2  TREATMENT/PLAN  Continue dipyridamole SR 250 mg/aspirin 25 mg orally twice a day for secondary stroke prevention.  F/u  EEG Stroke Service will sign off. Follow up with Dr. Pearlean Brownie, Stroke  Clinic, in 2 months.  Annie Main, MSN, RN, ANVP-BC, ANP-BC, [facility] Pager: [phone] 09/05/2012 10:53 AM  I have personally obtained a history, examined the patient, evaluated imaging results, and formulated the assessment and plan of care. I agree with the above.  Delia Heady, MD Medical Director [facility] Pager: [phone] 09/05/2012 10:53 AM

## 2012-09-06 DIAGNOSIS — I6350 Cerebral infarction due to unspecified occlusion or stenosis of unspecified cerebral artery: Secondary | ICD-10-CM

## 2012-09-06 NOTE — Progress Notes (Signed)
D/c orders received; IV removed with gauze on, pt remains in stable condition, pt meds and instructions reviewed and given to pt and pt family; reviewed stroke info and s/s with pts family; pt d/c to home with [facility] PT setup

## 2012-09-06 NOTE — Care Management Note (Signed)
    Page 1 of 2   09/06/2012     10:15:07 AM   CARE MANAGEMENT NOTE 09/06/2012  Patient:  Megan, [name]   Account Number:  [id number]  Date Initiated:  09/05/2012  Documentation initiated by:  Donn Pierini  Subjective/Objective Assessment:   Pt admitted with weakness r/o CVA     Action/Plan:   PTA pt lived at home with daughter- has private pay caregiver in home   Anticipated DC Date:  09/06/2012   Anticipated DC Plan:  HOME W HOME HEALTH SERVICES  In-house referral  Clinical Social Worker      DC Associate Professor  CM consult      [facility] Choice  HOME HEALTH   Choice offered to / List presented to:  C-4 Adult Children        HH arranged  HH-2 PT      HH agency  [facility]   Status of service:  Completed, signed off Medicare Important Message given?   (If response is "NO", the following Medicare IM given date fields will be blank) Date Medicare IM given:   Date Additional Medicare IM given:    Discharge Disposition:  HOME W HOME HEALTH SERVICES  Per UR Regulation:  Reviewed for med. necessity/level of care/duration of stay  If discussed at Long Length of Stay Meetings, dates discussed:    Comments:  09/06/12- 1000- Donn Pierini RN, BSN [phone] Pt for d/c today- order for HH-PT on chart- referral called to Lupita Leash with [facility] for [facility]- services to begin within 24-48 hr. post discharge.  09/05/12- 1640- Donn Pierini RN, BSN [phone] Spoke with pt's daughter [name] at bedside regarding need for [facility] at discharge- per conversation pt has had HH in the past with [facility] - list of HH agencies for [facility] given to daughter for review- per choice daughter she would still like to use [facility] for any [facility] services ordered. She has a private pay agency already in to home for caregiver services. Daughter would also like to speak with CSW regarding [facility] POA papers- and other community resources that may be available- referral to CSW made for these. Other items discussed where  PCS that are provided through Medicaid (which pt does not have) explained differences of what medicare pays for and what medicaid pays for. Explained to daughter that she could try to apply for Medicaid and PCS through medicaid through DSS if she was interested in that. Will f/u in am regarding HH orders

## 2012-09-06 NOTE — Clinical Social Work Psychosocial (Signed)
     Clinical Social Work Department BRIEF PSYCHOSOCIAL ASSESSMENT 09/06/2012  Patient:  Megan [name], Megan [name]     Account Number:  [id number]     Admit date:  09/03/2012  Clinical Social Worker:  Margaree Mackintosh  Date/Time:  09/06/2012 09:16 AM  Referred by:  Physician  Date Referred:  09/06/2012 Referred for  Advanced Directives   Other Referral:   Interview type:  Family Other interview type:   Dtr: [place] Nickel: [phone]    PSYCHOSOCIAL DATA Living Status:  FAMILY Admitted from facility:   Level of care:   Primary support name:  Dalia Jollie: [phone] Primary support relationship to patient:  CHILD, ADULT Degree of support available:   Adequate.    CURRENT CONCERNS Current Concerns  Other - See comment   Other Concerns:   Advance Directives.    SOCIAL WORK ASSESSMENT / PLAN Clinical Social Worker recieved referral indicating pt's family expressing interest in [facility] [place] [facility] [place].  CSW met with dtr and reviewed information.  CSW provided opportunity for questions/concerns to be addressed.  CSW provided HCPOA paperwork; pt and family to review information-pt dc'ing today.  CSW to sign off, please re consult if needed.   Assessment/plan status:  Information/Referral to [facility] Other assessment/ plan:   Information/referral to community resources:   HCPOA.    PATIENTS/FAMILYS RESPONSE TO PLAN OF CARE: Family was pleasant and engaged in conversation.  Family thanked CSW for intervention.

## 2012-09-06 NOTE — Discharge Summary (Signed)
Physician Discharge Summary  Patient ID: Megan [name] MRN: [id number] DOB/AGE: August 07, 1922 79 y.o.  Admit date: 09/03/2012 Discharge date: 09/06/2012  Admission Diagnoses: CVA Diabetes mellitus Hypertension  Discharge Diagnoses:  Principal Problem:  *CVA (cerebral infarction) Active Problems:  Diabetes mellitus  HTN (hypertension)   Discharged Condition: good  Hospital Course: The patient was admitted on 03/03/2013 with worsened right-sided weakness. She was not able to move her right arm or right leg. A CT scan of the brain was unremarkable. The patient was admitted for TIA versus stroke. An MRI of the brain was attempted but the patient declined was not cooperative so that the test was canceled. The patient did eventually have a CT angiogram of the head and neck on January 14 showing no acute stroke, hemorrhage or mass lesion. There were nonstenotic atherosclerotic changes in the distal left vertebral and bilateral carotid siphons and some other nonstenotic irregularities in the proximal ACA and MCA branches. There is a 50% stenosis of the right internal carotid artery. No severe stenosis. EEG was done to rule out seizure activity and was normal. The patient was seen by physical therapy and home physical therapy was recommended. The patient's blood sugar remained under good control. Her blood pressure was running low and her antihypertensive was held during hospitalization. At discharge her blood pressures running 102/59. The patient did have some brief urinary continence but by discharge this had resolved. UA had been negative.   Consults: neurology  Significant Diagnostic Studies: labs: As above and radiology: CT scan: As above  Treatments: IV hydration and anticoagulation: Aggrenox  Discharge Exam: Blood pressure 102/59, pulse 87, temperature 98.6 F (37 C), temperature source Oral, resp. rate 18, height 5\' 2"  (1.575 m), weight 55.112 kg (121 lb 8 oz), SpO2 99.00%. General  appearance: alert and cooperative Resp: clear to auscultation bilaterally Cardio: regular rate and rhythm, S1, S2 normal, no murmur, click, rub or gallop Neurologic: Motor: Right  arm 4+ over 5 right leg 4 minus over 5    Disposition: 01-Home or Self Care     Medication List     As of 09/06/2012  7:22 AM    STOP taking these medications         amLODipine-benazepril 5-10 MG per capsule   Commonly known as: LOTREL      TAKE these medications         dipyridamole-aspirin 200-25 MG per 12 hr capsule   Commonly known as: AGGRENOX   Take 1 capsule by mouth 2 (two) times daily.      metFORMIN 500 MG tablet   Commonly known as: GLUCOPHAGE   Take 1,000 mg by mouth 2 (two) times daily with a meal.      pantoprazole 40 MG tablet   Commonly known as: PROTONIX   Take 40 mg by mouth every evening.      rosuvastatin 5 MG tablet   Commonly known as: CRESTOR   Take 5 mg by mouth every Monday, Wednesday, and Friday.      sodium chloride inhaler solution   Commonly known as: BRONCHO SALINE   Take 2 sprays by nebulization as needed.           Follow-up Information    Follow up with Gates Rigg, MD. Schedule an appointment as soon as possible for a visit in 2 months. (stroke clinic)    Contact information:   [address], [address] [facility] [place] [address] [phone]       Follow up  with Lillia Mountain, MD. In 2 weeks.   Contact information:   [address], [address] Jaynie Crumble [place] [address] [phone]          Signed: Lillia Mountain 09/06/2012, 7:22 AM

## 2012-09-06 NOTE — Procedures (Signed)
EEG NUMBER:  [id number] [phone] [id number].  REFERRING PHYSICIAN:  Thora Lance, M.D.  INDICATION FOR STUDY:  An 79 year old lady with history of stroke who presented with new onset right-sided weakness.  The patient has a history of seizure about 5 months ago.  Study is being performed to rule out evidence of seizure disorder.  DESCRIPTION:  This is a routine EEG recording performed during wakefulness and during light sleep.  Predominant background activity during wakefulness consisted of symmetrical 8-9 Hz alpha rhythm, which attenuated well with eye opening.  Photic stimulation produced a symmetrical occipital driving response.  Hyperventilation was not performed.  During sleep, symmetrical vertex waves and sleep spindles were recorded.  No epileptiform discharges were recorded.  There were no areas of abnormal slowing.  INTERPRETATION:  This is a normal EEG for a patient of this age during wakefulness and during light sleep.     Noel Christmas, MD    ZO:XWRU D:  09/05/2012 16:12:46  T:  09/06/2012 01:33:33  Job #:  [id number]

## 2012-11-17 ENCOUNTER — Ambulatory Visit: Payer: Medicare Other | Attending: Internal Medicine | Admitting: Physical Therapy

## 2012-11-17 DIAGNOSIS — IMO0001 Reserved for inherently not codable concepts without codable children: Secondary | ICD-10-CM | POA: Insufficient documentation

## 2012-11-17 DIAGNOSIS — M6281 Muscle weakness (generalized): Secondary | ICD-10-CM | POA: Insufficient documentation

## 2012-11-17 DIAGNOSIS — R269 Unspecified abnormalities of gait and mobility: Secondary | ICD-10-CM | POA: Insufficient documentation

## 2012-11-24 ENCOUNTER — Ambulatory Visit: Payer: Medicare Other | Attending: Internal Medicine | Admitting: Physical Therapy

## 2012-11-24 DIAGNOSIS — R269 Unspecified abnormalities of gait and mobility: Secondary | ICD-10-CM | POA: Insufficient documentation

## 2012-11-24 DIAGNOSIS — IMO0001 Reserved for inherently not codable concepts without codable children: Secondary | ICD-10-CM | POA: Insufficient documentation

## 2012-11-24 DIAGNOSIS — M6281 Muscle weakness (generalized): Secondary | ICD-10-CM | POA: Insufficient documentation

## 2012-12-01 ENCOUNTER — Ambulatory Visit: Payer: Medicare Other | Admitting: Physical Therapy

## 2012-12-04 ENCOUNTER — Ambulatory Visit: Payer: Medicare Other | Admitting: Physical Therapy

## 2012-12-06 ENCOUNTER — Ambulatory Visit: Payer: Medicare Other | Admitting: Physical Therapy

## 2012-12-11 ENCOUNTER — Ambulatory Visit: Payer: Medicare Other | Admitting: Physical Therapy

## 2012-12-13 ENCOUNTER — Ambulatory Visit: Payer: Medicare Other | Admitting: Physical Therapy

## 2012-12-18 ENCOUNTER — Ambulatory Visit: Payer: Medicare Other | Admitting: Physical Therapy

## 2012-12-20 ENCOUNTER — Ambulatory Visit: Payer: Medicare Other | Admitting: Physical Therapy

## 2012-12-22 ENCOUNTER — Ambulatory Visit: Payer: Medicare Other | Attending: Internal Medicine | Admitting: Physical Therapy

## 2012-12-22 DIAGNOSIS — M6281 Muscle weakness (generalized): Secondary | ICD-10-CM | POA: Insufficient documentation

## 2012-12-22 DIAGNOSIS — IMO0001 Reserved for inherently not codable concepts without codable children: Secondary | ICD-10-CM | POA: Insufficient documentation

## 2012-12-22 DIAGNOSIS — R269 Unspecified abnormalities of gait and mobility: Secondary | ICD-10-CM | POA: Insufficient documentation

## 2012-12-25 ENCOUNTER — Ambulatory Visit: Payer: Medicare Other | Admitting: Physical Therapy

## 2012-12-27 ENCOUNTER — Ambulatory Visit: Payer: Medicare Other | Admitting: Physical Therapy

## 2013-06-25 ENCOUNTER — Ambulatory Visit (HOSPITAL_BASED_OUTPATIENT_CLINIC_OR_DEPARTMENT_OTHER): Payer: Medicare Other | Admitting: Physical Medicine & Rehabilitation

## 2013-06-25 ENCOUNTER — Encounter: Payer: Self-pay | Admitting: Physical Medicine & Rehabilitation

## 2013-06-25 ENCOUNTER — Encounter: Payer: Medicare Other | Attending: Physical Medicine & Rehabilitation

## 2013-06-25 VITALS — BP 126/56 | HR 80 | Resp 14 | Ht 62.0 in | Wt 125.0 lb

## 2013-06-25 DIAGNOSIS — I69998 Other sequelae following unspecified cerebrovascular disease: Secondary | ICD-10-CM | POA: Insufficient documentation

## 2013-06-25 DIAGNOSIS — R279 Unspecified lack of coordination: Secondary | ICD-10-CM | POA: Insufficient documentation

## 2013-06-25 DIAGNOSIS — I10 Essential (primary) hypertension: Secondary | ICD-10-CM | POA: Insufficient documentation

## 2013-06-25 DIAGNOSIS — E119 Type 2 diabetes mellitus without complications: Secondary | ICD-10-CM | POA: Insufficient documentation

## 2013-06-25 DIAGNOSIS — R29898 Other symptoms and signs involving the musculoskeletal system: Secondary | ICD-10-CM | POA: Insufficient documentation

## 2013-06-25 DIAGNOSIS — I69928 Other speech and language deficits following unspecified cerebrovascular disease: Secondary | ICD-10-CM | POA: Insufficient documentation

## 2013-06-25 DIAGNOSIS — G8110 Spastic hemiplegia affecting unspecified side: Secondary | ICD-10-CM | POA: Insufficient documentation

## 2013-06-25 NOTE — Patient Instructions (Addendum)
You need f/u with primary physician and neurology, encourage fluid intake

## 2013-06-25 NOTE — Progress Notes (Signed)
Subjective:    Patient ID: Megan [name], female    DOB: [birth year], 79 y.o.   MRN: [id number]  HPI  [name] is an 79 year old female with   history of diabetes mellitus, hypertension admitted March 13, 2011, with   right-sided weakness and slurred speech.  CT of head done was negative   for acute changes.  MRI of brain showed moderate left pontine infarct.   An 2-D echo done showed EF of 55% and carotid Dopplers done showed no   ICA stenosis.  The patient placed on Aggrenox for CVA prophylaxis.  She   currently continues with right-sided weakness with decreased balance on   his right knee block to prevent hyperextension.  The patient was   evaluated by rehab and we felt that she would benefit from [place] inpatient   rehab stay.   Daughter concerned about BP Has seen PCP who took her off BP,  Has not seen neurology MRI from 2013 reviewed. Diffuse MCA ACA branch stenosis. No carotid disease.  Pain Inventory Average Pain 0 Pain Right Now 0 My pain is no pain  In the last 24 hours, has pain interfered with the following? General activity 0 Relation with others 0 Enjoyment of life 0 What TIME of day is your pain at its worst? no pain Sleep (in general) Good  Pain is worse with: no pain Pain improves with: no pain Relief from Meds: no pain meds  Mobility walk with assistance use a walker how many minutes can you walk? 5-10 use a wheelchair  Function retired I need assistance with the following:  dressing, bathing, toileting, household duties and shopping  Neuro/Psych trouble walking  Prior Studies Any changes since last visit?  yes  Physicians involved in your care Any changes since last visit?  no   Family History  Problem Relation Age of Onset  . CAD Mother   . Hypertension Mother   . Diabetes Mother    History   Social History  . Marital Status: Widowed    Spouse Name: N/A    Number of Children: N/A  . Years of Education: N/A   Social History Main Topics   . Smoking status: Never Smoker   . Smokeless tobacco: Former Neurosurgeon    Types: Snuff  . Alcohol Use: No  . Drug Use: No  . Sexual Activity:    Other Topics Concern  . None   Social History Narrative  . None   History reviewed. No pertinent past surgical history. Past Medical History  Diagnosis Date  . Stroke   . Diabetes mellitus   . Hyperlipidemia   . Seizure    BP 126/56  Pulse 80  Resp 14  Ht 5\' 2"  (1.575 m)  Wt 125 lb (56.7 kg)  BMI 22.86 kg/m2  SpO2 100%      Review of Systems  Musculoskeletal: Positive for gait problem.  All other systems reviewed and are negative.       Objective:   Physical Exam    80, respirations 18, and O2 saturation 98% on room air.   GENERAL:  No acute distress.  Mood and affect appropriate.   NEUROLOGIC:  She is 3-/5 right deltoid, biceps, and triceps, 3-/5 knee   extension, 0/5 ankle dorsiflexion.  Hand function is trace.  Shoulder   has no pain with range of motion.  She has no evidence of spasticity   except at the biceps which would be graded as a 2 on the modified  Ashworth scale.      Assessment & Plan:  1. Left pontine infarct, right upper extremity weakness has improved since 2012. Works out at the [employer].  2. Blood pressure concerns. Daughter is concerned that systolic blood pressures run from 100-120, diastolic pressures are mostly in the 60s and even in the 50s. Patient does not seem to be symptomatic from these. Primary care has been following this and has taken the patient off of blood pressure medications. I recommended that she follows up with primary physician on this issue. I recommend that the patient is encouraged to drink since many stroke patient's do not take adequate fluids. She should also discuss any needs for continued sodium restriction with PCP  No PM&R f/u needed

## 2013-07-13 ENCOUNTER — Inpatient Hospital Stay (HOSPITAL_COMMUNITY): Payer: Medicare Other

## 2013-07-13 ENCOUNTER — Emergency Department (HOSPITAL_COMMUNITY): Payer: Medicare Other

## 2013-07-13 ENCOUNTER — Encounter (HOSPITAL_COMMUNITY): Payer: Self-pay | Admitting: Emergency Medicine

## 2013-07-13 ENCOUNTER — Inpatient Hospital Stay (HOSPITAL_COMMUNITY)
Admission: EM | Admit: 2013-07-13 | Discharge: 2013-07-22 | DRG: 595 | Disposition: A | Discharge status: Home / Self Care | Payer: Medicare Other | Attending: Internal Medicine | Admitting: Internal Medicine

## 2013-07-13 DIAGNOSIS — E785 Hyperlipidemia, unspecified: Secondary | ICD-10-CM | POA: Diagnosis present

## 2013-07-13 DIAGNOSIS — Z8669 Personal history of other diseases of the nervous system and sense organs: Secondary | ICD-10-CM | POA: Diagnosis present

## 2013-07-13 DIAGNOSIS — Z8673 Personal history of transient ischemic attack (TIA), and cerebral infarction without residual deficits: Secondary | ICD-10-CM

## 2013-07-13 DIAGNOSIS — B029 Zoster without complications: Principal | ICD-10-CM | POA: Diagnosis present

## 2013-07-13 DIAGNOSIS — D649 Anemia, unspecified: Secondary | ICD-10-CM | POA: Diagnosis present

## 2013-07-13 DIAGNOSIS — G459 Transient cerebral ischemic attack, unspecified: Secondary | ICD-10-CM | POA: Diagnosis present

## 2013-07-13 DIAGNOSIS — E43 Unspecified severe protein-calorie malnutrition: Secondary | ICD-10-CM | POA: Diagnosis present

## 2013-07-13 DIAGNOSIS — R569 Unspecified convulsions: Secondary | ICD-10-CM | POA: Diagnosis present

## 2013-07-13 DIAGNOSIS — G8110 Spastic hemiplegia affecting unspecified side: Secondary | ICD-10-CM | POA: Diagnosis present

## 2013-07-13 DIAGNOSIS — R29898 Other symptoms and signs involving the musculoskeletal system: Secondary | ICD-10-CM | POA: Diagnosis present

## 2013-07-13 DIAGNOSIS — E1165 Type 2 diabetes mellitus with hyperglycemia: Secondary | ICD-10-CM | POA: Diagnosis present

## 2013-07-13 DIAGNOSIS — G9340 Encephalopathy, unspecified: Secondary | ICD-10-CM | POA: Diagnosis present

## 2013-07-13 DIAGNOSIS — E119 Type 2 diabetes mellitus without complications: Secondary | ICD-10-CM | POA: Diagnosis present

## 2013-07-13 DIAGNOSIS — I639 Cerebral infarction, unspecified: Secondary | ICD-10-CM

## 2013-07-13 DIAGNOSIS — K219 Gastro-esophageal reflux disease without esophagitis: Secondary | ICD-10-CM | POA: Diagnosis present

## 2013-07-13 DIAGNOSIS — R4182 Altered mental status, unspecified: Secondary | ICD-10-CM | POA: Diagnosis present

## 2013-07-13 DIAGNOSIS — I10 Essential (primary) hypertension: Secondary | ICD-10-CM | POA: Diagnosis present

## 2013-07-13 LAB — COMPREHENSIVE METABOLIC PANEL
BUN: 14 mg/dL (ref 6–23)
CO2: 22 mEq/L (ref 19–32)
Calcium: 10 mg/dL (ref 8.4–10.5)
Creatinine, Ser: 0.75 mg/dL (ref 0.50–1.10)
GFR calc Af Amer: 84 mL/min — ABNORMAL LOW (ref >90)
GFR calc non Af Amer: 73 mL/min — ABNORMAL LOW (ref >90)
Glucose, Bld: 153 mg/dL — ABNORMAL HIGH (ref 70–99)
Sodium: 133 mEq/L — ABNORMAL LOW (ref 135–145)
Total Protein: 7.1 g/dL (ref 6.0–8.3)

## 2013-07-13 LAB — CBC WITH DIFFERENTIAL/PLATELET
Basophils Relative: 0 % (ref 0–1)
HCT: 29.3 % — ABNORMAL LOW (ref 36.0–46.0)
Hemoglobin: 9.8 g/dL — ABNORMAL LOW (ref 12.0–15.0)
Lymphocytes Relative: 26 % (ref 12–46)
Lymphs Abs: 1.4 10*3/uL (ref 0.7–4.0)
MCHC: 33.4 g/dL (ref 30.0–36.0)
Monocytes Absolute: 0.8 10*3/uL (ref 0.1–1.0)
Monocytes Relative: 15 % — ABNORMAL HIGH (ref 3–12)
Neutro Abs: 3.2 10*3/uL (ref 1.7–7.7)
Neutrophils Relative %: 59 % (ref 43–77)
RBC: 3.15 MIL/uL — ABNORMAL LOW (ref 3.87–5.11)
WBC: 5.5 10*3/uL (ref 4.0–10.5)

## 2013-07-13 LAB — URINALYSIS, ROUTINE W REFLEX MICROSCOPIC
Ketones, ur: NEGATIVE mg/dL
Leukocytes, UA: NEGATIVE
Nitrite: NEGATIVE
pH: 6 (ref 5.0–8.0)

## 2013-07-13 LAB — HEMOGLOBIN A1C
Hgb A1c MFr Bld: 6.5 % — ABNORMAL HIGH (ref <5.7)
Mean Plasma Glucose: 140 mg/dL — ABNORMAL HIGH (ref <117)

## 2013-07-13 LAB — GLUCOSE, CAPILLARY
Glucose-Capillary: 150 mg/dL — ABNORMAL HIGH (ref 70–99)
Glucose-Capillary: 148 mg/dL — ABNORMAL HIGH (ref 70–99)

## 2013-07-13 LAB — PROTIME-INR: INR: 1.08 (ref 0.00–1.49)

## 2013-07-13 LAB — CG4 I-STAT (LACTIC ACID): Lactic Acid, Venous: 1.94 mmol/L (ref 0.5–2.2)

## 2013-07-13 MED ORDER — VANCOMYCIN HCL IN DEXTROSE 750-5 MG/150ML-% IV SOLN
750.0000 mg | INTRAVENOUS | Status: DC
  Administered 2013-07-14 – 2013-07-15 (×2): 750 mg via INTRAVENOUS
  Filled 2013-07-13 (×3): qty 150

## 2013-07-13 MED ORDER — VANCOMYCIN HCL IN DEXTROSE 1-5 GM/200ML-% IV SOLN
1000.0000 mg | Freq: Once | INTRAVENOUS | Status: AC
  Administered 2013-07-13: 1000 mg via INTRAVENOUS
  Filled 2013-07-13: qty 200

## 2013-07-13 MED ORDER — HEPARIN SODIUM (PORCINE) 5000 UNIT/ML IJ SOLN: 5000.0000 [IU] | Freq: Three times a day (TID) | INTRAMUSCULAR | Status: DC

## 2013-07-13 MED ORDER — SODIUM CHLORIDE 0.9 % IV SOLN
1000.0000 mL | Freq: Once | INTRAVENOUS | Status: AC
  Administered 2013-07-13: 1000 mL via INTRAVENOUS

## 2013-07-13 MED ORDER — TETRACAINE HCL 0.5 % OP SOLN
1.0000 [drp] | Freq: Once | OPHTHALMIC | Status: AC
  Administered 2013-07-13: 1 [drp] via OPHTHALMIC
  Filled 2013-07-13: qty 2

## 2013-07-13 MED ORDER — ATORVASTATIN CALCIUM 10 MG PO TABS
10.0000 mg | ORAL_TABLET | Freq: Every day | ORAL | Status: DC
  Filled 2013-07-13 (×2): qty 1

## 2013-07-13 MED ORDER — PIPERACILLIN-TAZOBACTAM 3.375 G IVPB: 3.3750 g | Freq: Three times a day (TID) | INTRAVENOUS | Status: DC

## 2013-07-13 MED ORDER — DEXTROSE 5 % IV SOLN
10.0000 mg/kg | INTRAVENOUS | Status: AC
  Administered 2013-07-13: 500 mg via INTRAVENOUS
  Filled 2013-07-13: qty 10

## 2013-07-13 MED ORDER — DEXTROSE 5 % IV SOLN
2.0000 g | Freq: Two times a day (BID) | INTRAVENOUS | Status: DC
  Administered 2013-07-14 – 2013-07-16 (×4): 2 g via INTRAVENOUS
  Filled 2013-07-13 (×6): qty 2

## 2013-07-13 MED ORDER — DEXTROSE 5 % IV SOLN
250.0000 mg | Freq: Three times a day (TID) | INTRAVENOUS | Status: DC
  Filled 2013-07-13: qty 5

## 2013-07-13 MED ORDER — ACETAMINOPHEN 650 MG RE SUPP
650.0000 mg | Freq: Once | RECTAL | Status: AC
  Administered 2013-07-13: 650 mg via RECTAL
  Filled 2013-07-13: qty 1

## 2013-07-13 MED ORDER — DEXTROSE 5 % IV SOLN
250.0000 mg | Freq: Once | INTRAVENOUS | Status: DC
  Filled 2013-07-13 (×2): qty 5

## 2013-07-13 MED ORDER — PANTOPRAZOLE SODIUM 40 MG PO TBEC
40.0000 mg | DELAYED_RELEASE_TABLET | Freq: Every evening | ORAL | Status: DC
  Administered 2013-07-13 – 2013-07-21 (×9): 40 mg via ORAL
  Filled 2013-07-13 (×8): qty 1

## 2013-07-13 MED ORDER — DEXTROSE 5 % IV SOLN
2.0000 g | INTRAVENOUS | Status: DC
  Administered 2013-07-13: 2 g via INTRAVENOUS
  Filled 2013-07-13: qty 2

## 2013-07-13 MED ORDER — DEXTROSE 5 % IV SOLN
10.0000 mg/kg | Freq: Three times a day (TID) | INTRAVENOUS | Status: DC
  Administered 2013-07-13 – 2013-07-15 (×6): 500 mg via INTRAVENOUS
  Filled 2013-07-13 (×9): qty 10

## 2013-07-13 MED ORDER — SODIUM CHLORIDE 0.9 % IV SOLN
1000.0000 mL | INTRAVENOUS | Status: DC
  Administered 2013-07-13 – 2013-07-15 (×2): 1000 mL via INTRAVENOUS

## 2013-07-13 MED ORDER — SODIUM CHLORIDE 0.9 % IV SOLN
INTRAVENOUS | Status: DC
  Administered 2013-07-13 – 2013-07-15 (×3): via INTRAVENOUS

## 2013-07-13 MED ORDER — ACETAMINOPHEN 650 MG RE SUPP
650.0000 mg | Freq: Four times a day (QID) | RECTAL | Status: DC | PRN
  Administered 2013-07-14 – 2013-07-15 (×2): 650 mg via RECTAL
  Filled 2013-07-13 (×2): qty 1

## 2013-07-13 MED ORDER — INSULIN ASPART 100 UNIT/ML ~~LOC~~ SOLN
0.0000 [IU] | Freq: Three times a day (TID) | SUBCUTANEOUS | Status: DC
  Administered 2013-07-13 – 2013-07-14 (×3): 1 [IU] via SUBCUTANEOUS
  Administered 2013-07-14: 2 [IU] via SUBCUTANEOUS
  Administered 2013-07-15: 3 [IU] via SUBCUTANEOUS
  Administered 2013-07-15 – 2013-07-16 (×3): 2 [IU] via SUBCUTANEOUS
  Administered 2013-07-16 (×2): 3 [IU] via SUBCUTANEOUS
  Administered 2013-07-17: 2 [IU] via SUBCUTANEOUS
  Administered 2013-07-17: 3 [IU] via SUBCUTANEOUS
  Administered 2013-07-17: 1 [IU] via SUBCUTANEOUS
  Administered 2013-07-18: 3 [IU] via SUBCUTANEOUS
  Administered 2013-07-19: 2 [IU] via SUBCUTANEOUS
  Administered 2013-07-19: 1 [IU] via SUBCUTANEOUS
  Administered 2013-07-20 – 2013-07-21 (×5): 2 [IU] via SUBCUTANEOUS
  Administered 2013-07-21: 1 [IU] via SUBCUTANEOUS
  Filled 2013-07-13: qty 1

## 2013-07-13 MED ORDER — DEXTROSE 5 % IV SOLN: 2.0000 g | Freq: Once | INTRAVENOUS | Status: DC

## 2013-07-13 MED ORDER — FLUORESCEIN SODIUM 1 MG OP STRP
1.0000 | ORAL_STRIP | Freq: Once | OPHTHALMIC | Status: AC
  Administered 2013-07-13: 1 via OPHTHALMIC
  Filled 2013-07-13: qty 1

## 2013-07-13 MED ORDER — PIPERACILLIN-TAZOBACTAM 3.375 G IVPB 30 MIN
3.3750 g | Freq: Once | INTRAVENOUS | Status: AC
  Administered 2013-07-13: 3.375 g via INTRAVENOUS
  Filled 2013-07-13: qty 50

## 2013-07-13 NOTE — H&P (Signed)
[facility] History and Physical   @LOGO @  Megan [name] WGN:[id number] DOB: July 21, 1922 DOA: 07/13/2013  Referring physician: Dr. Criss Alvine PCP: Lillia Mountain, MD  Specialists: Neurology  Chief Complaint: right leg weakness, acute encephalopathy  HPI: Megan [name] is a 79 y.o. female has a past medical history significant for prior CVA with residual right-sided weakness, hypertension, diabetes, hyperlipidemia, history of seizures, presents to the emergency room with a chief complaint of right-sided weakness and altered mental status. Family says that she was okay up until yesterday, when she noticed that she was unable to bear weight on the right side due to weakness, and this is unusual for her, previously she was able to walk. They also noticed that she has moments of more confusion and lethargy alternating with periods that she is more alert. Patient is alert in the emergency room, however cannot contribute much to the story she can tell me that she doesn't have any pain. There is no reported fever at home, chills, no dominant pain, nausea vomiting or diarrhea. No reports of chest pain, lightheadedness and dizziness. Family noticed a small rash that appeared last night is the single lesion above her left eye, and this morning has spread to her left side of her forehead. In the emergency room, patient with temperature of 100.6, heart rate of 99, normotensive and breathing comfortably on room air. [facility] ask for admission.  Review of Systems: Unable to obtain full review of systems do to altered mental status.  Past Medical History  Diagnosis Date  . Stroke   . Diabetes mellitus   . Hyperlipidemia   . Seizure    No past surgical history on file. Social History:  reports that she has never smoked. She has quit using smokeless tobacco. Her smokeless tobacco use included Snuff. She reports that she does not drink alcohol or use illicit drugs.  No Known Allergies  Family  History  Problem Relation Age of Onset  . CAD Mother   . Hypertension Mother   . Diabetes Mother    Prior to Admission medications   Medication Sig Start Date End Date Taking? Authorizing Provider  dipyridamole-aspirin (AGGRENOX) 25-200 MG per 12 hr capsule Take 1 capsule by mouth 2 (two) times daily.   Yes Historical Provider, MD  metFORMIN (GLUCOPHAGE) 500 MG tablet Take 1,000 mg by mouth 2 (two) times daily with a meal.   Yes Historical Provider, MD  pantoprazole (PROTONIX) 40 MG tablet Take 40 mg by mouth every evening.   Yes Historical Provider, MD  rosuvastatin (CRESTOR) 5 MG tablet Take 5 mg by mouth every Monday, Wednesday, and Friday.   Yes Historical Provider, MD  sodium chloride (BRONCHO SALINE) inhaler solution Take 2 sprays by nebulization as needed.   Yes Historical Provider, MD  ONE TOUCH ULTRA TEST test strip  05/12/13   Historical Provider, MD   Physical Exam: Filed Vitals:   07/13/13 0641 07/13/13 0642 07/13/13 0830 07/13/13 0900  BP: 131/46  134/58 136/57  Pulse:   85 81  Temp:  100.6 F (38.1 C)    TempSrc:  Rectal    Resp: 15     Height:  5\' 2"  (1.575 m)    Weight:  56.7 kg (125 lb)    SpO2: 99%  100% 100%     General:  No apparent distress, confused but alert.  Eyes: Pupils reactive to light.  ENT: moist oropharynx  Neck: supple, no JVD  Cardiovascular: regular rate without MRG; 2+ peripheral pulses  Respiratory:  CTA biL, good air movement without wheezing, rhonchi or crackled  Abdomen: soft, non tender to palpation, positive bowel sounds, no guarding, no rebound  Skin: Erythematous raised rash on left for head, consistent with early zoster  Musculoskeletal: 1+ peripheral edema  Neurologic: Left facial droop, right-sided weakness.  Labs on Admission:  Basic Metabolic Panel:  Recent Labs Lab 07/13/13 0706  NA 133*  K 4.1  CL 99  CO2 22  GLUCOSE 153*  BUN 14  CREATININE 0.75  CALCIUM 10.0   Liver Function Tests:  Recent Labs Lab  07/13/13 0706  AST 14  ALT 5  ALKPHOS 63  BILITOT 0.2*  PROT 7.1  ALBUMIN 3.4*   CBC:  Recent Labs Lab 07/13/13 0706  WBC 5.5  NEUTROABS 3.2  HGB 9.8*  HCT 29.3*  MCV 93.0  PLT 241   CBG:  Recent Labs Lab 07/13/13 0740  GLUCAP 150*    Radiological Exams on Admission: Ct Head Wo Contrast (only If Suspected Head Trauma And/or Pt Is On Anticoagulant)  07/13/2013   CLINICAL DATA:  Altered mental status.  EXAM: CT HEAD WITHOUT CONTRAST  TECHNIQUE: Contiguous axial images were obtained from the base of the skull through the vertex without intravenous contrast.  COMPARISON:  09/04/2012  FINDINGS: No mass lesion. No midline shift. No acute hemorrhage or hematoma. No extra-axial fluid collections. No evidence of acute infarction.  There is diffuse cerebral cortical and cerebellar atrophy with periventricular white matter lucency consistent with small vessel ischemic disease, unchanged since the prior exam. No osseous abnormality.  IMPRESSION: No acute abnormality. Chronic atrophy and chronic small vessel ischemic disease.   Electronically Signed   By: Geanie Cooley M.D.   On: 07/13/2013 08:02   Dg Chest Port 1 View  07/13/2013   CLINICAL DATA:  79 year old female altered mental status. Initial encounter.  EXAM: PORTABLE CHEST - 1 VIEW  COMPARISON:  09/03/2012 and earlier.  FINDINGS: Portable AP upright view at 0655 hrs. Lower lung volumes. Normal cardiac size and mediastinal contours. Visualized tracheal air column is within normal limits. No pneumothorax, pulmonary edema, pleural effusion or consolidation. Mild increased left basilar opacity most resembles atelectasis.  IMPRESSION: Lower lung volumes with mild basilar atelectasis.   Electronically Signed   By: Augusto Gamble M.D.   On: 07/13/2013 07:16   EKG: Independently reviewed.  Assessment/Plan Principal Problem:   TIA (transient ischemic attack) Active Problems:   Diabetes mellitus   HTN (hypertension)   H/O   Seizure    Spastic hemiplegia affecting dominant side   Herpes zoster   TIA/CVA - due to worsening weakness. Patient not fully able to participate in neurological exam, but is weak on the right side. Neurology has been consulted. MRI will be obtained. Patient will be admitted to telemetry. Acute encephalopathy - may be due to #1. Among the differential is also neurologic complications from zoster infection. Appreciate neurology input. MRI now, and if negative LP. She is covered empirically with broad-spectrum antibiotics. Hypertension - will allow permissive hypertension due to #1 Herpes zoster - monitor rash for eye involvement, antivirals started.  Diabetes mellitus - sliding scale insulin   Diet: N.p.o. for now Fluids: Normal saline at 50 DVT Prophylaxis: SCDs  Code Status: Full code  Family Communication: Daughters in the room  Disposition Plan: Inpatient  Time spent: 51  Conway Fedora M. Elvera Lennox, MD [facility] Pager [phone]  If 7PM-7AM, please contact night-coverage [email or web address] Password [email or web address] 07/13/2013, 10:01 AM

## 2013-07-13 NOTE — Progress Notes (Addendum)
ANTIBIOTIC CONSULT NOTE - INITIAL  Pharmacy Consult:  Acyclovir Indication:  Herpes zoster  No Known Allergies  Patient Measurements: Height: 5\' 2"  (157.5 cm) Weight: 125 lb (56.7 kg) IBW/kg (Calculated) : 50.1  Vital Signs: Temp: 100.6 F (38.1 C) (11/21 0642) Temp src: Rectal (11/21 0642) BP: 136/57 mmHg (11/21 0900) Pulse Rate: 81 (11/21 0900)  Labs:  Recent Labs  07/13/13 0706  WBC 5.5  HGB 9.8*  PLT 241  CREATININE 0.75   Estimated Creatinine Clearance: 37.7 ml/min (by C-G formula based on Cr of 0.75). No results found for this basename: VANCOTROUGH, VANCOPEAK, VANCORANDOM, GENTTROUGH, GENTPEAK, GENTRANDOM, TOBRATROUGH, TOBRAPEAK, TOBRARND, AMIKACINPEAK, AMIKACINTROU, AMIKACIN,  in the last 72 hours   Microbiology: No results found for this or any previous visit (from the past 720 hour(s)).  Medical History: Past Medical History  Diagnosis Date  . Stroke   . Diabetes mellitus   . Hyperlipidemia   . Seizure       Assessment: 72 YOF arrived from home via EMS with AMS.  Pharmacy consulted to manage acyclovir for herpes zoster (rash on forehead).  Noted she was started on vancomycin and Zosyn earlier for rule out sepsis.  Baseline labs reviewed.  Vanc 11/21 >> Zosyn 11/21 >> Acyclovir 11/21 >>  11/21 urine cx - collected 11/21 blood cx - collected   Goal of Therapy:  Vanc trough 15-20 mcg/mL   Plan:  - Acyclovir 250mg  IV Q8H (5mg /kg/dose using IBW), recommend 5-7 days of therapy - Vanc 750mg  IV Q24H - Zosyn 3.375gm IV Q8H, 4 hr infusion - Monitor renal fxn, clinical course, vanc trough as indicated    Siriah Treat D. Laney Potash, PharmD, BCPS Pager:  [phone] 07/13/2013, 9:42 AM    ==================================  Addendum:   Patient to change from Zosyn to Rocephin for meningitis.  Concern with viral encephalitis, so will not add ampicillin at this time per MD.   Plan: - Rocephin 2gm IV Q12H - Will check vanc trough prior to 4th dose -  Increase acyclovir to 500mg  IV Q8H (~10mg /kg/dose using IBW) for rule out encephalitis    Sutton Plake D. Laney Potash, PharmD, BCPS Pager:  [phone] 07/13/2013, 10:13 AM

## 2013-07-13 NOTE — ED Notes (Signed)
Neuro at bedside. MRI tech waiting to take patient to MRI

## 2013-07-13 NOTE — ED Provider Notes (Deleted)
CSN: [id number]     Arrival date & time 07/13/13  6962 History   First MD Initiated Contact with Patient 07/13/13 [phone]     Chief Complaint  Patient presents with  . Altered Mental Status   (Consider location/radiation/quality/duration/timing/severity/associated sxs/prior Treatment) HPI  Past Medical History  Diagnosis Date  . Stroke   . Diabetes mellitus   . Hyperlipidemia   . Seizure    No past surgical history on file. Family History  Problem Relation Age of Onset  . CAD Mother   . Hypertension Mother   . Diabetes Mother    History  Substance Use Topics  . Smoking status: Never Smoker   . Smokeless tobacco: Former Neurosurgeon    Types: Snuff  . Alcohol Use: No   OB History   Grav Para Term Preterm Abortions TAB SAB Ect Mult Living                 Review of Systems  Allergies  Review of patient's allergies indicates no known allergies.  Home Medications   Current Outpatient Rx  Name  Route  Sig  Dispense  Refill  . dipyridamole-aspirin (AGGRENOX) 25-200 MG per 12 hr capsule   Oral   Take 1 capsule by mouth 2 (two) times daily.         . metFORMIN (GLUCOPHAGE) 500 MG tablet   Oral   Take 1,000 mg by mouth 2 (two) times daily with a meal.         . ONE TOUCH ULTRA TEST test strip               . pantoprazole (PROTONIX) 40 MG tablet   Oral   Take 40 mg by mouth every evening.         . rosuvastatin (CRESTOR) 5 MG tablet   Oral   Take 5 mg by mouth every Monday, Wednesday, and Friday.         . sodium chloride (BRONCHO SALINE) inhaler solution   Nebulization   Take 2 sprays by nebulization as needed.          BP 131/46  Temp(Src) 100.6 F (38.1 C) (Rectal)  Resp 15  SpO2 99% Physical Exam  ED Course  Procedures (including critical care time) Labs Review Labs Reviewed  COMPREHENSIVE METABOLIC PANEL  CBC WITH DIFFERENTIAL  PROTIME-INR  URINALYSIS, ROUTINE W REFLEX MICROSCOPIC   Imaging Review No results found.  EKG  Interpretation   None       MDM  No diagnosis found.  Date: 07/13/2013  Rate: 98  Rhythm: normal sinus rhythm  QRS Axis: normal  Intervals: normal  ST/T Wave abnormalities: normal  Conduction Disutrbances:none  Narrative Interpretation:   Old EKG Reviewed: none available      Lindsay Soulliere K Bentzion Dauria-Rasch, MD 07/13/13 [phone]

## 2013-07-13 NOTE — ED Provider Notes (Signed)
CSN: [id number]     Arrival date & time 07/13/13  4098 History   First MD Initiated Contact with Patient 07/13/13 [phone]     Chief Complaint  Patient presents with  . Altered Mental Status   (Consider location/radiation/quality/duration/timing/severity/associated sxs/prior Treatment) HPI Comments: Patient is an 79 year old female past medical history significant for DM, hyperlipidemia, seizure, prior stroke affecting the right side of her body was last seen normal the day before yesterday. The family states that the patient has episodes of being altered where is she is non-conversant, lethargic, and has increased right sided weakness from baseline. Patient does have residual weakness on the right side from a stroke and February of this year but she is still rehabbing. The family has also noted a red rash to forehead that began last evening and they feel is spreading. They deny any changes in her medications. Denies any emesis, diarrhea, constipation, cough. Patient is a level V caveat d/t mental status change.   Patient is a 79 y.o. female presenting with altered mental status. The history is provided by a relative.  Altered Mental Status   Past Medical History  Diagnosis Date  . Stroke   . Diabetes mellitus   . Hyperlipidemia   . Seizure    No past surgical history on file. Family History  Problem Relation Age of Onset  . CAD Mother   . Hypertension Mother   . Diabetes Mother    History  Substance Use Topics  . Smoking status: Never Smoker   . Smokeless tobacco: Former Neurosurgeon    Types: Snuff  . Alcohol Use: No   OB History   Grav Para Term Preterm Abortions TAB SAB Ect Mult Living                 Review of Systems  Unable to perform ROS: Mental status change    Allergies  Review of patient's allergies indicates no known allergies.  Home Medications   Current Outpatient Rx  Name  Route  Sig  Dispense  Refill  . dipyridamole-aspirin (AGGRENOX) 25-200 MG per 12 hr  capsule   Oral   Take 1 capsule by mouth 2 (two) times daily.         . metFORMIN (GLUCOPHAGE) 500 MG tablet   Oral   Take 1,000 mg by mouth 2 (two) times daily with a meal.         . pantoprazole (PROTONIX) 40 MG tablet   Oral   Take 40 mg by mouth every evening.         . rosuvastatin (CRESTOR) 5 MG tablet   Oral   Take 5 mg by mouth every Monday, Wednesday, and Friday.         . sodium chloride (BRONCHO SALINE) inhaler solution   Nebulization   Take 2 sprays by nebulization as needed.         . ONE TOUCH ULTRA TEST test strip                BP 133/82  Pulse 76  Temp(Src) 98.6 F (37 C) (Rectal)  Resp 18  Ht 5\' 2"  (1.575 m)  Wt 125 lb (56.7 kg)  BMI 22.86 kg/m2  SpO2 100% Physical Exam  Constitutional: She appears well-developed and well-nourished.  HENT:  Head: Normocephalic and atraumatic.  Right Ear: External ear normal.  Left Ear: External ear normal.  Nose: Nose normal.  Eyes: Conjunctivae are normal. Right pupil is reactive. Left pupil is reactive.  Slit lamp  exam:      The left eye shows no corneal abrasion, no corneal flare, no corneal ulcer, no foreign body, no hyphema and no hypopyon.  Neck: Neck supple.  Cardiovascular: Normal rate, regular rhythm and normal heart sounds.   Pulmonary/Chest: Effort normal and breath sounds normal. No respiratory distress.  Abdominal: Soft. There is no tenderness.  Neurological: She is alert.  Skin: Skin is warm and dry. Rash noted. Rash is vesicular.     Grouped vesicles on left forehead that do not cross midline.     ED Course  Procedures (including critical care time) Labs Review Labs Reviewed  COMPREHENSIVE METABOLIC PANEL - Abnormal; Notable for the following:    Sodium 133 (*)    Glucose, Bld 153 (*)    Albumin 3.4 (*)    Total Bilirubin 0.2 (*)    GFR calc non Af Amer 73 (*)    GFR calc Af Amer 84 (*)    All other components within normal limits  CBC WITH DIFFERENTIAL - Abnormal; Notable  for the following:    RBC 3.15 (*)    Hemoglobin 9.8 (*)    HCT 29.3 (*)    Monocytes Relative 15 (*)    All other components within normal limits  GLUCOSE, CAPILLARY - Abnormal; Notable for the following:    Glucose-Capillary 150 (*)    All other components within normal limits  CULTURE, BLOOD (ROUTINE X 2)  CULTURE, BLOOD (ROUTINE X 2)  URINE CULTURE  GRAM STAIN  PROTIME-INR  URINALYSIS, ROUTINE W REFLEX MICROSCOPIC  HEMOGLOBIN A1C  URINE RAPID DRUG SCREEN (HOSP PERFORMED)  CG4 I-STAT (LACTIC ACID)   Imaging Review Ct Head Wo Contrast (only If Suspected Head Trauma And/or Pt Is On Anticoagulant)  07/13/2013   CLINICAL DATA:  Altered mental status.  EXAM: CT HEAD WITHOUT CONTRAST  TECHNIQUE: Contiguous axial images were obtained from the base of the skull through the vertex without intravenous contrast.  COMPARISON:  09/04/2012  FINDINGS: No mass lesion. No midline shift. No acute hemorrhage or hematoma. No extra-axial fluid collections. No evidence of acute infarction.  There is diffuse cerebral cortical and cerebellar atrophy with periventricular white matter lucency consistent with small vessel ischemic disease, unchanged since the prior exam. No osseous abnormality.  IMPRESSION: No acute abnormality. Chronic atrophy and chronic small vessel ischemic disease.   Electronically Signed   By: Geanie Cooley M.D.   On: 07/13/2013 08:02   Mri Brain Without Contrast  07/13/2013   CLINICAL DATA:  Altered mental status.  TIA.  EXAM: MRI HEAD WITHOUT CONTRAST  MRA HEAD WITHOUT CONTRAST  TECHNIQUE: Multiplanar, multiecho pulse sequences of the brain and surrounding structures were obtained without intravenous contrast. Angiographic images of the head were obtained using MRA technique without contrast.  COMPARISON:  Head CT 07/13/2013 and brain MRI/MRA 01/07/2012  FINDINGS: MRI HEAD FINDINGS  Images are moderately degraded by motion artifact.  T2 hyperintensity is again seen within the pons and  midbrain, consistent with chronic ischemia. There is evidence of progressive, nonacute ischemic change in the left cerebral peduncle since the prior study. T2 hyperintensities in the subcortical and deep cerebral white matter bilaterally are similar to the prior study and consistent with moderate chronic small vessel ischemic disease. Small remote basal ganglia and thalamic lacunar infarcts are again noted. There is moderate generalized cerebral atrophy. There is no evidence of acute infarct, intracranial hemorrhage, mass, midline shift, or extra-axial fluid collection. Prior bilateral cataract surgery is noted. There is minimal bilateral  ethmoid air cell mucosal thickening.  MRA HEAD FINDINGS  Images are moderately to severely degraded by motion artifact. No flow was again identified in the distal right vertebral artery. The distal left vertebral artery and basilar artery are widely patent. Superior cerebellar arteries are patent bilaterally. PCAs are patent bilaterally. Posterior communicating arteries are not identified. Internal carotid arteries are patent from skullbase to carotid terminus. ACA and MCA origins are patent. ACA, MCA, and PCA branches again demonstrate multiple areas of irregular narrowing, however evaluation for progression since the prior study is not possible due to motion artifact on the current exam. There is no evidence of new major vessel occlusion. No intracranial aneurysm is identified.  IMPRESSION: Suboptimal examination due to moderate to severe motion artifact.  1. No evidence of acute infarct or other acute intracranial abnormality. 2. Extensive chronic microvascular ischemic disease with progression of left mid brain ischemic change. 3. Persistent nonvisualization of the distal right vertebral artery, which may reflect occlusion or possibly severe hypoplasia. 4. Intracranial arterial irregularity consistent with atherosclerotic disease. Evaluation for interval change from the prior  study is limited due to marked motion artifact on this exam.   Electronically Signed   By: Sebastian Ache   On: 07/13/2013 12:25   Dg Chest Port 1 View  07/13/2013   CLINICAL DATA:  79 year old female altered mental status. Initial encounter.  EXAM: PORTABLE CHEST - 1 VIEW  COMPARISON:  09/03/2012 and earlier.  FINDINGS: Portable AP upright view at 0655 hrs. Lower lung volumes. Normal cardiac size and mediastinal contours. Visualized tracheal air column is within normal limits. No pneumothorax, pulmonary edema, pleural effusion or consolidation. Mild increased left basilar opacity most resembles atelectasis.  IMPRESSION: Lower lung volumes with mild basilar atelectasis.   Electronically Signed   By: Augusto Gamble M.D.   On: 07/13/2013 07:16   Mr Maxine Glenn Head/brain Wo Cm  07/13/2013   CLINICAL DATA:  Altered mental status.  TIA.  EXAM: MRI HEAD WITHOUT CONTRAST  MRA HEAD WITHOUT CONTRAST  TECHNIQUE: Multiplanar, multiecho pulse sequences of the brain and surrounding structures were obtained without intravenous contrast. Angiographic images of the head were obtained using MRA technique without contrast.  COMPARISON:  Head CT 07/13/2013 and brain MRI/MRA 01/07/2012  FINDINGS: MRI HEAD FINDINGS  Images are moderately degraded by motion artifact.  T2 hyperintensity is again seen within the pons and midbrain, consistent with chronic ischemia. There is evidence of progressive, nonacute ischemic change in the left cerebral peduncle since the prior study. T2 hyperintensities in the subcortical and deep cerebral white matter bilaterally are similar to the prior study and consistent with moderate chronic small vessel ischemic disease. Small remote basal ganglia and thalamic lacunar infarcts are again noted. There is moderate generalized cerebral atrophy. There is no evidence of acute infarct, intracranial hemorrhage, mass, midline shift, or extra-axial fluid collection. Prior bilateral cataract surgery is noted. There is minimal  bilateral ethmoid air cell mucosal thickening.  MRA HEAD FINDINGS  Images are moderately to severely degraded by motion artifact. No flow was again identified in the distal right vertebral artery. The distal left vertebral artery and basilar artery are widely patent. Superior cerebellar arteries are patent bilaterally. PCAs are patent bilaterally. Posterior communicating arteries are not identified. Internal carotid arteries are patent from skullbase to carotid terminus. ACA and MCA origins are patent. ACA, MCA, and PCA branches again demonstrate multiple areas of irregular narrowing, however evaluation for progression since the prior study is not possible due to motion artifact on  the current exam. There is no evidence of new major vessel occlusion. No intracranial aneurysm is identified.  IMPRESSION: Suboptimal examination due to moderate to severe motion artifact.  1. No evidence of acute infarct or other acute intracranial abnormality. 2. Extensive chronic microvascular ischemic disease with progression of left mid brain ischemic change. 3. Persistent nonvisualization of the distal right vertebral artery, which may reflect occlusion or possibly severe hypoplasia. 4. Intracranial arterial irregularity consistent with atherosclerotic disease. Evaluation for interval change from the prior study is limited due to marked motion artifact on this exam.   Electronically Signed   By: Sebastian Ache   On: 07/13/2013 12:25    EKG Interpretation    Date/Time:  Friday July 13 2013 06:40:21 EST Ventricular Rate:  98 PR Interval:  198 QRS Duration: 69 QT Interval:  306 QTC Calculation: 391 R Axis:     Text Interpretation:  Normal sinus rhythm Low voltage, precordial leads No significant change since last tracing Confirmed by GOLDSTON  MD, SCOTT ([id number]) on 07/13/2013 8:05:16 AM            MDM   1. Altered mental state   2. Herpes zoster     Patient febrile, alert, but not oriented x 1 day. Herpes  Zoster noted on left forehead. No involvement of optic nerve, no evidence of dendrites, ulcers, or abrasions on staining. Vitals reviewed. I have reviewed nursing notes, vital signs, and all appropriate lab and imaging results for this patient. Antibiotics given for unknown source of fever w/ AMS. Acyclovir started for Herpes Zoster.   Discussed with family multiple times regarding need for LP, family unsure about procedure despite explanation of procedure and why we would be obtaining the LP. Patient will just be sent up to the floor for further medical management w/o LP being performed in ED.   The patient appears reasonably stabilized for admission considering the current resources, flow, and capabilities available in the ED at this time, and I doubt any other [facility] requiring further screening and/or treatment in the ED prior to admission. Patient d/w with Dr. Criss Alvine, agrees with plan.         Jeannetta Ellis, PA-C 07/13/13 1543

## 2013-07-13 NOTE — ED Notes (Signed)
rn on 4 n unable to take report call  back

## 2013-07-13 NOTE — ED Notes (Signed)
Pt has a small rash to left forehead. Pt was helpful and assisted in rolling side to side for in and out and tylenol rectal. Pt is hard of hearing and when told to roll asked what did you say? Pt family states that pt is acting more towards her normal.

## 2013-07-13 NOTE — ED Notes (Signed)
Neurology at bedside.

## 2013-07-13 NOTE — ED Notes (Signed)
Pt arrived from home via [facility], c/o altered mental status per Family. Per EMS Pt hot to touchand rash on forehead. Tachycardic with EMS. EMS VS BP 102, 129/72, CBG 136

## 2013-07-13 NOTE — Progress Notes (Signed)
ANTIBIOTIC CONSULT NOTE - INITIAL  Pharmacy Consult for vancomycin Indication: rule out sepsis  No Known Allergies  Patient Measurements: Height: 5\' 2"  (157.5 cm) Weight: 125 lb (56.7 kg) IBW/kg (Calculated) : 50.1  Vital Signs: Temp: 100.6 F (38.1 C) (11/21 4098) Temp src: Rectal (11/21 0642) BP: 131/46 mmHg (11/21 0641)  Labs: Estimated Creatinine Clearance: 37.7 ml/min (by C-G formula based on Cr of 0.67).   Microbiology: No results found for this or any previous visit (from the past 720 hour(s)).  Medical History: Past Medical History  Diagnosis Date  . Stroke   . Diabetes mellitus   . Hyperlipidemia   . Seizure     Medications:   (Not in a hospital admission) Scheduled:  . acetaminophen  650 mg Rectal Once   Infusions:  . sodium chloride     Followed by  . sodium chloride    . piperacillin-tazobactam    . vancomycin      Assessment: 79yo female c/o AMS x1.5d w/ fatigue and decreased alertness, to begin IV ABX for suspected sepsis.  Goal of Therapy:  Vancomycin trough level 15-20 mcg/ml  Plan:  Awaiting current labs; EDPA has ordered vancomycin 1g x1 and Zosyn 3.375g IV x1; will dose further when labs result.  Vernard Gambles, PharmD, BCPS  07/13/2013,6:59 AM

## 2013-07-13 NOTE — ED Notes (Signed)
rocephine is not in said pyxis.  Pharmacy  Will send med

## 2013-07-13 NOTE — ED Notes (Signed)
Called Bed Control to inquire as to what the delay is in bed assignment.  Pt will require a negative pressure room.

## 2013-07-13 NOTE — ED Notes (Signed)
1200n dose of insulin not given pt in mri will give next dose at 1700 if the pt is still here

## 2013-07-13 NOTE — ED Notes (Signed)
Pt returned from MRI °

## 2013-07-13 NOTE — ED Notes (Signed)
Floor nurse called and stated that his floor cannot take this pt because she needs a negative pressure room.  This RN called Company secretary.  She will call floor.  This RN advised flow manager that unit has already cleared the room from the pt's chart and she is showing as bed requested.  Flow  Manager to call floor.  Charge RN, Megan [name] notified of delay.

## 2013-07-13 NOTE — Consult Note (Signed)
NEURO HOSPITALIST CONSULT NOTE    Reason for Consult: AMS and possible herpes encephalitis.   HPI:                                                                                                                                          Megan [name] is an 79 y.o. female past medical history significant for prior CVA with residual right-sided weakness, hypertension, diabetes, hyperlipidemia, history of seizures, presents to the emergency room after family members noted she was showing some confusion.  Family says that she was okay up until yesterday, when she noticed that she was unable to bear weight on the right side due to weakness, and this is unusual for her, previously she was able to walk. They also noticed that she has moments of more confusion and lethargy alternating with periods that she is more alert. Yesterday family noted a small rash on the left superior orbital region which has now spread to left frontalis and shows small vesicles.  Patient has not complaints of pain, burning or discomfort in the area of the rash. In the emergency room, patient with temperature of 100.6, heart rate of 99, normotensive and breathing comfortably on room air. Although patient shows a left facial droop at rest, she does wear dentures and her smile is symmetrical. She is currently being treated with Acyclovir by ED.    Past Medical History  Diagnosis Date  . Stroke   . Diabetes mellitus   . Hyperlipidemia   . Seizure     No past surgical history on file.  Family History  Problem Relation Age of Onset  . CAD Mother   . Hypertension Mother   . Diabetes Mother      Social History:  reports that she has never smoked. She has quit using smokeless tobacco. Her smokeless tobacco use included Snuff. She reports that she does not drink alcohol or use illicit drugs.  No Known Allergies  MEDICATIONS:                                                                                                                      Current Facility-Administered Medications  Medication Dose Route Frequency Provider Last Rate Last Dose  . 0.9 %  sodium chloride infusion  1,000  mL Intravenous Continuous Jennifer L Piepenbrink, PA-C   1,000 mL at 07/13/13 0737  . 0.9 %  sodium chloride infusion   Intravenous Continuous Costin Otelia Sergeant, MD      . acyclovir (ZOVIRAX) 500 mg in dextrose 5 % 100 mL IVPB  10 mg/kg (Ideal) Intravenous STAT [address] [facility]      . acyclovir (ZOVIRAX) 500 mg in dextrose 5 % 100 mL IVPB  10 mg/kg (Ideal) Intravenous Q8H [address], [place]      . cefTRIAXone (ROCEPHIN) 2 g in dextrose 5 % 50 mL IVPB  2 g Intravenous STAT Thuy Dien Dang, [facility]      . cefTRIAXone (ROCEPHIN) 2 g in dextrose 5 % 50 mL IVPB  2 g Intravenous Q12H [address] [facility]      . insulin aspart (novoLOG) injection 0-9 Units  0-9 Units Subcutaneous TID WC Costin Otelia Sergeant, MD      . Melene Muller ON 07/14/2013] vancomycin (VANCOCIN) IVPB 750 mg/150 ml premix  750 mg Intravenous Q24H Thuy Earlie Raveling, [facility]       Current Outpatient Prescriptions  Medication Sig Dispense Refill  . dipyridamole-aspirin (AGGRENOX) 25-200 MG per 12 hr capsule Take 1 capsule by mouth 2 (two) times daily.      . metFORMIN (GLUCOPHAGE) 500 MG tablet Take 1,000 mg by mouth 2 (two) times daily with a meal.      . pantoprazole (PROTONIX) 40 MG tablet Take 40 mg by mouth every evening.      . rosuvastatin (CRESTOR) 5 MG tablet Take 5 mg by mouth every Monday, Wednesday, and Friday.      . sodium chloride (BRONCHO SALINE) inhaler solution Take 2 sprays by nebulization as needed.      . ONE TOUCH ULTRA TEST test strip           ROS:                                                                                                                                       History obtained from family at bedside  General ROS: negative for - chills, fatigue, fever, night sweats, weight gain or weight loss Psychological ROS: negative for -  behavioral disorder, hallucinations, memory difficulties, mood swings or suicidal ideation Ophthalmic ROS: negative for - blurry vision, double vision, eye pain or loss of vision ENT ROS: negative for - epistaxis, nasal discharge, oral lesions, sore throat, tinnitus or vertigo Allergy and Immunology ROS: negative for - hives or itchy/watery eyes Hematological and Lymphatic ROS: negative for - bleeding problems, bruising or swollen lymph nodes Endocrine ROS: negative for - galactorrhea, hair pattern changes, polydipsia/polyuria or temperature intolerance Respiratory ROS: negative for - cough, hemoptysis, shortness of breath or wheezing Cardiovascular ROS: negative for - chest pain, dyspnea on exertion, edema or irregular heartbeat Gastrointestinal ROS: negative for - abdominal pain, diarrhea, hematemesis, nausea/vomiting or stool incontinence Genito-Urinary  ROS: negative for - dysuria, hematuria, incontinence or urinary frequency/urgency Musculoskeletal ROS: negative for - joint swelling or muscular weakness Neurological ROS: as noted in HPI Dermatological ROS: negative for rash and skin lesion changes   Blood pressure 136/57, pulse 81, temperature 100.6 F (38.1 C), temperature source Rectal, resp. rate 15, height 5\' 2"  (1.575 m), weight 56.7 kg (125 lb), SpO2 100.00%.   Neurologic Examination:                                                                                                      Mental Status: Alert,no verbal output, followed minimal commands but did squeeze my hand and look toward voices. Cranial Nerves: II: Discs flat bilaterally; Visual fields grossly normal, pupils equal, round, reactive to light and accommodation III,IV, VI: ptosis not present, extra-ocular motions intact bilaterally V,VII: smile symmetric, facial light touch sensation normal bilaterally VIII: hearing normal bilaterally IX,X: gag reflex present XI: bilateral shoulder shrug XII: midline tongue  extension without atrophy or fasciculations  Motor: Right : Upper extremity   4/5 (increased tone) Left:     Upper extremity   5/5  Lower extremity   4/5     Lower extremity   5/5 Tone and bulk:normal tone throughout; no atrophy noted Sensory: Pinprick and light touch intact throughout, bilaterally Deep Tendon Reflexes:  Right: Upper Extremity   Left: Upper extremity   biceps (C-5 to C-6) 2/4   biceps (C-5 to C-6) 2/4 tricep (C7) 2/4    triceps (C7) 2/4 Brachioradialis (C6) 2/4  Brachioradialis (C6) 2/4  Lower Extremity Lower Extremity  quadriceps (L-2 to L-4) 2/4   quadriceps (L-2 to L-4) 2/4 Achilles (S1) 0/4   Achilles (S1) 0/4  Plantars: Right: up going   Left: downgoing Cerebellar: Would not follow commands Gait: not tested CV: pulses palpable throughout    Lab Results  Component Value Date/Time   CHOL 117 09/04/2012  6:40 AM    Results for orders placed during the hospital encounter of 07/13/13 (from the past 48 hour(s))  COMPREHENSIVE METABOLIC PANEL     Status: Abnormal   Collection Time    07/13/13  7:06 AM      Result Value Range   Sodium 133 (*) 135 - 145 mEq/L   Potassium 4.1  3.5 - 5.1 mEq/L   Chloride 99  96 - 112 mEq/L   CO2 22  19 - 32 mEq/L   Glucose, Bld 153 (*) 70 - 99 mg/dL   BUN 14  6 - 23 mg/dL   Creatinine, Ser 4.09  0.50 - 1.10 mg/dL   Calcium 81.1  8.4 - 91.4 mg/dL   Total Protein 7.1  6.0 - 8.3 g/dL   Albumin 3.4 (*) 3.5 - 5.2 g/dL   AST 14  0 - 37 U/L   ALT 5  0 - 35 U/L   Alkaline Phosphatase 63  39 - 117 U/L   Total Bilirubin 0.2 (*) 0.3 - 1.2 mg/dL   GFR calc non Af Amer 73 (*) >90 mL/min   GFR calc Af Amer 84 (*) >90  mL/min   Comment: (NOTE)     The eGFR has been calculated using the CKD EPI equation.     This calculation has not been validated in all clinical situations.     eGFR's persistently <90 mL/min signify possible Chronic Kidney     Disease.  CBC WITH DIFFERENTIAL     Status: Abnormal   Collection Time    07/13/13  7:06  AM      Result Value Range   WBC 5.5  4.0 - 10.5 K/uL   RBC 3.15 (*) 3.87 - 5.11 MIL/uL   Hemoglobin 9.8 (*) 12.0 - 15.0 g/dL   HCT 16.1 (*) 09.6 - 04.5 %   MCV 93.0  78.0 - 100.0 fL   MCH 31.1  26.0 - 34.0 pg   MCHC 33.4  30.0 - 36.0 g/dL   RDW 40.9  81.1 - 91.4 %   Platelets 241  150 - 400 K/uL   Neutrophils Relative % 59  43 - 77 %   Neutro Abs 3.2  1.7 - 7.7 K/uL   Lymphocytes Relative 26  12 - 46 %   Lymphs Abs 1.4  0.7 - 4.0 K/uL   Monocytes Relative 15 (*) 3 - 12 %   Monocytes Absolute 0.8  0.1 - 1.0 K/uL   Eosinophils Relative 1  0 - 5 %   Eosinophils Absolute 0.0  0.0 - 0.7 K/uL   Basophils Relative 0  0 - 1 %   Basophils Absolute 0.0  0.0 - 0.1 K/uL  PROTIME-INR     Status: None   Collection Time    07/13/13  7:06 AM      Result Value Range   Prothrombin Time 13.8  11.6 - 15.2 seconds   INR 1.08  0.00 - 1.49  GLUCOSE, CAPILLARY     Status: Abnormal   Collection Time    07/13/13  7:40 AM      Result Value Range   Glucose-Capillary 150 (*) 70 - 99 mg/dL  CG4 I-STAT (LACTIC ACID)     Status: None   Collection Time    07/13/13  7:42 AM      Result Value Range   Lactic Acid, Venous 1.94  0.5 - 2.2 mmol/L  URINALYSIS, ROUTINE W REFLEX MICROSCOPIC     Status: None   Collection Time    07/13/13  8:20 AM      Result Value Range   Color, Urine YELLOW  YELLOW   APPearance CLEAR  CLEAR   Specific Gravity, Urine 1.019  1.005 - 1.030   pH 6.0  5.0 - 8.0   Glucose, UA NEGATIVE  NEGATIVE mg/dL   Hgb urine dipstick NEGATIVE  NEGATIVE   Bilirubin Urine NEGATIVE  NEGATIVE   Ketones, ur NEGATIVE  NEGATIVE mg/dL   Protein, ur NEGATIVE  NEGATIVE mg/dL   Urobilinogen, UA 0.2  0.0 - 1.0 mg/dL   Nitrite NEGATIVE  NEGATIVE   Leukocytes, UA NEGATIVE  NEGATIVE   Comment: MICROSCOPIC NOT DONE ON URINES WITH NEGATIVE PROTEIN, BLOOD, LEUKOCYTES, NITRITE, OR GLUCOSE <1000 mg/dL.    Ct Head Wo Contrast (only If Suspected Head Trauma And/or Pt Is On Anticoagulant)  07/13/2013    CLINICAL DATA:  Altered mental status.  EXAM: CT HEAD WITHOUT CONTRAST  TECHNIQUE: Contiguous axial images were obtained from the base of the skull through the vertex without intravenous contrast.  COMPARISON:  09/04/2012  FINDINGS: No mass lesion. No midline shift. No acute hemorrhage or hematoma. No extra-axial fluid  collections. No evidence of acute infarction.  There is diffuse cerebral cortical and cerebellar atrophy with periventricular white matter lucency consistent with small vessel ischemic disease, unchanged since the prior exam. No osseous abnormality.  IMPRESSION: No acute abnormality. Chronic atrophy and chronic small vessel ischemic disease.   Electronically Signed   By: Geanie Cooley M.D.   On: 07/13/2013 08:02   Dg Chest Port 1 View  07/13/2013   CLINICAL DATA:  79 year old female altered mental status. Initial encounter.  EXAM: PORTABLE CHEST - 1 VIEW  COMPARISON:  09/03/2012 and earlier.  FINDINGS: Portable AP upright view at 0655 hrs. Lower lung volumes. Normal cardiac size and mediastinal contours. Visualized tracheal air column is within normal limits. No pneumothorax, pulmonary edema, pleural effusion or consolidation. Mild increased left basilar opacity most resembles atelectasis.  IMPRESSION: Lower lung volumes with mild basilar atelectasis.   Electronically Signed   By: Augusto Gamble M.D.   On: 07/13/2013 07:16    Assessment and plan per attending neurologist  Felicie Morn PA-C [facility] [phone]  07/13/2013, 11:06 AM   Assessment/Plan: 79 YO female with questionable Shingles of left forehead in association with AMS, low grade fever and increased right sided weakness. Given history of CVA must rule out further CVA.  IF MRI negative would recommend LP while in ED (discussed with Dr. Criss Alvine) to evaluate for herpes encephalitis.   Recommend: 1) MRI brain  2) If MRI negative LP while in ED for glucose, protein, cell and diff, culture, HSV by PCR, Save  rest      Patient seen and examined together with physician assistant and I concur with the assessment and plan.  Wyatt Portela, MD

## 2013-07-13 NOTE — Progress Notes (Signed)
Called ED for report  

## 2013-07-13 NOTE — Progress Notes (Signed)
SPEECH PATHOLOGY:  Orders received for swallow eval.  Per chart review, pt has passed RN stroke swallow screen; therefore per protocol formalized SLP swallow not warranted.  Will sign -off.  Taiden Raybourn L. Samson Frederic, [place] CCC/SLP Pager [phone]

## 2013-07-13 NOTE — ED Notes (Signed)
Patient transported to CT 

## 2013-07-13 NOTE — ED Notes (Signed)
Report called to 4n rm [id number]

## 2013-07-13 NOTE — ED Notes (Signed)
hospitalist at bedside

## 2013-07-13 NOTE — ED Notes (Signed)
Pt currently in MRI

## 2013-07-14 DIAGNOSIS — I6350 Cerebral infarction due to unspecified occlusion or stenosis of unspecified cerebral artery: Secondary | ICD-10-CM

## 2013-07-14 DIAGNOSIS — I059 Rheumatic mitral valve disease, unspecified: Secondary | ICD-10-CM

## 2013-07-14 LAB — BASIC METABOLIC PANEL
BUN: 6 mg/dL (ref 6–23)
CO2: 24 mEq/L (ref 19–32)
Calcium: 9 mg/dL (ref 8.4–10.5)
Chloride: 96 mEq/L (ref 96–112)
Creatinine, Ser: 0.56 mg/dL (ref 0.50–1.10)
GFR calc Af Amer: >90 mL/min (ref >90)
Potassium: 3.7 mEq/L (ref 3.5–5.1)
Sodium: 131 mEq/L — ABNORMAL LOW (ref 135–145)

## 2013-07-14 LAB — LIPID PANEL
Cholesterol: 93 mg/dL (ref 0–200)
LDL Cholesterol: 44 mg/dL (ref 0–99)
Total CHOL/HDL Ratio: 2.5 RATIO
Triglycerides: 60 mg/dL (ref <150)
VLDL: 12 mg/dL (ref 0–40)

## 2013-07-14 LAB — CBC
HCT: 26.9 % — ABNORMAL LOW (ref 36.0–46.0)
MCV: 90.9 fL (ref 78.0–100.0)
RBC: 2.96 MIL/uL — ABNORMAL LOW (ref 3.87–5.11)
RDW: 12.5 % (ref 11.5–15.5)
WBC: 6 10*3/uL (ref 4.0–10.5)

## 2013-07-14 LAB — URINE CULTURE: Colony Count: NO GROWTH

## 2013-07-14 LAB — GLUCOSE, CAPILLARY
Glucose-Capillary: 140 mg/dL — ABNORMAL HIGH (ref 70–99)
Glucose-Capillary: 189 mg/dL — ABNORMAL HIGH (ref 70–99)

## 2013-07-14 MED ORDER — ENSURE COMPLETE PO LIQD
237.0000 mL | Freq: Two times a day (BID) | ORAL | Status: DC
  Administered 2013-07-15 – 2013-07-22 (×11): 237 mL via ORAL

## 2013-07-14 NOTE — Progress Notes (Signed)
NEURO HOSPITALIST PROGRESS NOTE   SUBJECTIVE:                                                                                                                        Drowsy but open eyes and follows commands. MRI-DWI negative for acute abnormality. MRA brain consistent with intracranial atherosclerotic disease. On acyclovir, ceftriaxone, vancomycin  OBJECTIVE:                                                                                                                           Vital signs in last 24 hours: Temp:  [98 F (36.7 C)-100.5 F (38.1 C)] 99 F (37.2 C) (11/22 1417) Pulse Rate:  [76-97] 86 (11/22 1417) Resp:  [18-20] 18 (11/22 1417) BP: (133-152)/(52-82) 137/66 mmHg (11/22 1417) SpO2:  [97 %-100 %] 97 % (11/22 1417) Weight:  [53.9 kg (118 lb 13.3 oz)] 53.9 kg (118 lb 13.3 oz) (11/21 1828)  Intake/Output from previous day:   Intake/Output this shift:   Nutritional status: Carb Control  Past Medical History  Diagnosis Date  . Stroke   . Diabetes mellitus   . Hyperlipidemia   . Seizure     Neurologic Exam:  Mental Status:  Drowsy but open eyes on verbal commands and is oriented to place. No dysarthria or aphasia. Cranial Nerves:  II: Discs flat bilaterally; Visual fields grossly normal, pupils equal, round, reactive to light and accommodation  III,IV, VI: ptosis not present, extra-ocular motions intact bilaterally  V,VII: smile symmetric, facial light touch sensation normal bilaterally  VIII: hearing normal bilaterally  IX,X: gag reflex present  XI: bilateral shoulder shrug  XII: midline tongue extension without atrophy or fasciculations  Motor:  Right : Upper extremity 4/5 (increased tone) Left: Upper extremity 5/5  Lower extremity 4/5 Lower extremity 5/5  Tone and bulk:normal tone throughout; no atrophy noted  Sensory: Pinprick and light touch intact throughout, bilaterally  Deep Tendon Reflexes:  Right: Upper  Extremity Left: Upper extremity  biceps (C-5 to C-6) 2/4 biceps (C-5 to C-6) 2/4  tricep (C7) 2/4 triceps (C7) 2/4  Brachioradialis (C6) 2/4 Brachioradialis (C6) 2/4  Lower Extremity Lower Extremity  quadriceps (L-2 to L-4) 2/4 quadriceps (L-2 to L-4) 2/4  Achilles (S1) 0/4 Achilles (  S1) 0/4  Plantars:  Right: up going Left: downgoing  Cerebellar:  No tested. Gait: not tested  CV: pulses palpable throughout    Lab Results: Lab Results  Component Value Date/Time   CHOL 93 07/14/2013  7:35 AM   Lipid Panel  Recent Labs  07/14/13 0735  CHOL 93  TRIG 60  HDL 37*  CHOLHDL 2.5  VLDL 12  LDLCALC 44    Studies/Results: Ct Head Wo Contrast (only If Suspected Head Trauma And/or Pt Is On Anticoagulant)  07/13/2013   CLINICAL DATA:  Altered mental status.  EXAM: CT HEAD WITHOUT CONTRAST  TECHNIQUE: Contiguous axial images were obtained from the base of the skull through the vertex without intravenous contrast.  COMPARISON:  09/04/2012  FINDINGS: No mass lesion. No midline shift. No acute hemorrhage or hematoma. No extra-axial fluid collections. No evidence of acute infarction.  There is diffuse cerebral cortical and cerebellar atrophy with periventricular white matter lucency consistent with small vessel ischemic disease, unchanged since the prior exam. No osseous abnormality.  IMPRESSION: No acute abnormality. Chronic atrophy and chronic small vessel ischemic disease.   Electronically Signed   By: Geanie Cooley M.D.   On: 07/13/2013 08:02   Mri Brain Without Contrast  07/13/2013   CLINICAL DATA:  Altered mental status.  TIA.  EXAM: MRI HEAD WITHOUT CONTRAST  MRA HEAD WITHOUT CONTRAST  TECHNIQUE: Multiplanar, multiecho pulse sequences of the brain and surrounding structures were obtained without intravenous contrast. Angiographic images of the head were obtained using MRA technique without contrast.  COMPARISON:  Head CT 07/13/2013 and brain MRI/MRA 01/07/2012  FINDINGS: MRI HEAD FINDINGS   Images are moderately degraded by motion artifact.  T2 hyperintensity is again seen within the pons and midbrain, consistent with chronic ischemia. There is evidence of progressive, nonacute ischemic change in the left cerebral peduncle since the prior study. T2 hyperintensities in the subcortical and deep cerebral white matter bilaterally are similar to the prior study and consistent with moderate chronic small vessel ischemic disease. Small remote basal ganglia and thalamic lacunar infarcts are again noted. There is moderate generalized cerebral atrophy. There is no evidence of acute infarct, intracranial hemorrhage, mass, midline shift, or extra-axial fluid collection. Prior bilateral cataract surgery is noted. There is minimal bilateral ethmoid air cell mucosal thickening.  MRA HEAD FINDINGS  Images are moderately to severely degraded by motion artifact. No flow was again identified in the distal right vertebral artery. The distal left vertebral artery and basilar artery are widely patent. Superior cerebellar arteries are patent bilaterally. PCAs are patent bilaterally. Posterior communicating arteries are not identified. Internal carotid arteries are patent from skullbase to carotid terminus. ACA and MCA origins are patent. ACA, MCA, and PCA branches again demonstrate multiple areas of irregular narrowing, however evaluation for progression since the prior study is not possible due to motion artifact on the current exam. There is no evidence of new major vessel occlusion. No intracranial aneurysm is identified.  IMPRESSION: Suboptimal examination due to moderate to severe motion artifact.  1. No evidence of acute infarct or other acute intracranial abnormality. 2. Extensive chronic microvascular ischemic disease with progression of left mid brain ischemic change. 3. Persistent nonvisualization of the distal right vertebral artery, which may reflect occlusion or possibly severe hypoplasia. 4. Intracranial  arterial irregularity consistent with atherosclerotic disease. Evaluation for interval change from the prior study is limited due to marked motion artifact on this exam.   Electronically Signed   By: Sebastian Ache  On: 07/13/2013 12:25   Dg Chest Port 1 View  07/13/2013   CLINICAL DATA:  79 year old female altered mental status. Initial encounter.  EXAM: PORTABLE CHEST - 1 VIEW  COMPARISON:  09/03/2012 and earlier.  FINDINGS: Portable AP upright view at 0655 hrs. Lower lung volumes. Normal cardiac size and mediastinal contours. Visualized tracheal air column is within normal limits. No pneumothorax, pulmonary edema, pleural effusion or consolidation. Mild increased left basilar opacity most resembles atelectasis.  IMPRESSION: Lower lung volumes with mild basilar atelectasis.   Electronically Signed   By: Augusto Gamble M.D.   On: 07/13/2013 07:16   Mr Maxine Glenn Head/brain Wo Cm  07/13/2013   CLINICAL DATA:  Altered mental status.  TIA.  EXAM: MRI HEAD WITHOUT CONTRAST  MRA HEAD WITHOUT CONTRAST  TECHNIQUE: Multiplanar, multiecho pulse sequences of the brain and surrounding structures were obtained without intravenous contrast. Angiographic images of the head were obtained using MRA technique without contrast.  COMPARISON:  Head CT 07/13/2013 and brain MRI/MRA 01/07/2012  FINDINGS: MRI HEAD FINDINGS  Images are moderately degraded by motion artifact.  T2 hyperintensity is again seen within the pons and midbrain, consistent with chronic ischemia. There is evidence of progressive, nonacute ischemic change in the left cerebral peduncle since the prior study. T2 hyperintensities in the subcortical and deep cerebral white matter bilaterally are similar to the prior study and consistent with moderate chronic small vessel ischemic disease. Small remote basal ganglia and thalamic lacunar infarcts are again noted. There is moderate generalized cerebral atrophy. There is no evidence of acute infarct, intracranial hemorrhage,  mass, midline shift, or extra-axial fluid collection. Prior bilateral cataract surgery is noted. There is minimal bilateral ethmoid air cell mucosal thickening.  MRA HEAD FINDINGS  Images are moderately to severely degraded by motion artifact. No flow was again identified in the distal right vertebral artery. The distal left vertebral artery and basilar artery are widely patent. Superior cerebellar arteries are patent bilaterally. PCAs are patent bilaterally. Posterior communicating arteries are not identified. Internal carotid arteries are patent from skullbase to carotid terminus. ACA and MCA origins are patent. ACA, MCA, and PCA branches again demonstrate multiple areas of irregular narrowing, however evaluation for progression since the prior study is not possible due to motion artifact on the current exam. There is no evidence of new major vessel occlusion. No intracranial aneurysm is identified.  IMPRESSION: Suboptimal examination due to moderate to severe motion artifact.  1. No evidence of acute infarct or other acute intracranial abnormality. 2. Extensive chronic microvascular ischemic disease with progression of left mid brain ischemic change. 3. Persistent nonvisualization of the distal right vertebral artery, which may reflect occlusion or possibly severe hypoplasia. 4. Intracranial arterial irregularity consistent with atherosclerotic disease. Evaluation for interval change from the prior study is limited due to marked motion artifact on this exam.   Electronically Signed   By: Sebastian Ache   On: 07/13/2013 12:25    MEDICATIONS  I have reviewed the patient's current medications.  ASSESSMENT/PLAN:                                                                                                            79 YO female with questionable Shingles of left forehead in association with AMS,  low grade fever and increased right sided weakness. No acute stroke on MRI. Family declined LP yesterday. Patient already on antibiotics. Will follow up.  Wyatt Portela, MD [facility] [phone]  07/14/2013, 3:23 PM

## 2013-07-14 NOTE — Progress Notes (Signed)
VASCULAR LAB PRELIMINARY  PRELIMINARY  PRELIMINARY  PRELIMINARY  Carotid duplex  completed.    Preliminary report:  Bilateral:  1-39% ICA stenosis.  Vertebral artery flow is antegrade.      Megan [name], RVT 07/14/2013, 11:37 AM

## 2013-07-14 NOTE — Progress Notes (Signed)
Subjective: She denies pain  Objective: Vital signs in last 24 hours: Temp:  [98 F (36.7 C)-100.5 F (38.1 C)] 98.2 F (36.8 C) (11/22 0954) Pulse Rate:  [76-97] 82 (11/22 0954) Resp:  [18-20] 18 (11/22 0954) BP: (118-152)/(52-82) 136/63 mmHg (11/22 0954) SpO2:  [97 %-100 %] 100 % (11/22 0954) Weight:  [53.9 kg (118 lb 13.3 oz)] 53.9 kg (118 lb 13.3 oz) (11/21 1828) Weight change: -2.8 kg (-6 lb 2.8 oz) Last BM Date: 07/13/13  Intake/Output from previous day:   Intake/Output this shift:    Resp: clear to auscultation bilaterally Cardio: regular rate and rhythm, S1, S2 normal, no murmur, click, rub or gallop Skin: Skin color, texture, turgor normal. No rashes or lesions or erythemous rash left upper forehead Neurologic: Mental status: Alert, oriented, thought content appropriate, alert difficult to assess due to poor hearingleft facial droop and bilat grip 5/5  Lab Results:  Recent Labs  07/13/13 0706 07/14/13 0735  WBC 5.5 6.0  HGB 9.8* 9.2*  HCT 29.3* 26.9*  PLT 241 255   BMET  Recent Labs  07/13/13 0706 07/14/13 0735  NA 133* 131*  K 4.1 3.7  CL 99 96  CO2 22 24  GLUCOSE 153* 158*  BUN 14 6  CREATININE 0.75 0.56  CALCIUM 10.0 9.0    Studies/Results: Ct Head Wo Contrast (only If Suspected Head Trauma And/or Pt Is On Anticoagulant)  07/13/2013   CLINICAL DATA:  Altered mental status.  EXAM: CT HEAD WITHOUT CONTRAST  TECHNIQUE: Contiguous axial images were obtained from the base of the skull through the vertex without intravenous contrast.  COMPARISON:  09/04/2012  FINDINGS: No mass lesion. No midline shift. No acute hemorrhage or hematoma. No extra-axial fluid collections. No evidence of acute infarction.  There is diffuse cerebral cortical and cerebellar atrophy with periventricular white matter lucency consistent with small vessel ischemic disease, unchanged since the prior exam. No osseous abnormality.  IMPRESSION: No acute abnormality. Chronic atrophy and  chronic small vessel ischemic disease.   Electronically Signed   By: Geanie Cooley M.D.   On: 07/13/2013 08:02   Mri Brain Without Contrast  07/13/2013   CLINICAL DATA:  Altered mental status.  TIA.  EXAM: MRI HEAD WITHOUT CONTRAST  MRA HEAD WITHOUT CONTRAST  TECHNIQUE: Multiplanar, multiecho pulse sequences of the brain and surrounding structures were obtained without intravenous contrast. Angiographic images of the head were obtained using MRA technique without contrast.  COMPARISON:  Head CT 07/13/2013 and brain MRI/MRA 01/07/2012  FINDINGS: MRI HEAD FINDINGS  Images are moderately degraded by motion artifact.  T2 hyperintensity is again seen within the pons and midbrain, consistent with chronic ischemia. There is evidence of progressive, nonacute ischemic change in the left cerebral peduncle since the prior study. T2 hyperintensities in the subcortical and deep cerebral white matter bilaterally are similar to the prior study and consistent with moderate chronic small vessel ischemic disease. Small remote basal ganglia and thalamic lacunar infarcts are again noted. There is moderate generalized cerebral atrophy. There is no evidence of acute infarct, intracranial hemorrhage, mass, midline shift, or extra-axial fluid collection. Prior bilateral cataract surgery is noted. There is minimal bilateral ethmoid air cell mucosal thickening.  MRA HEAD FINDINGS  Images are moderately to severely degraded by motion artifact. No flow was again identified in the distal right vertebral artery. The distal left vertebral artery and basilar artery are widely patent. Superior cerebellar arteries are patent bilaterally. PCAs are patent bilaterally. Posterior communicating arteries are not identified. Internal  carotid arteries are patent from skullbase to carotid terminus. ACA and MCA origins are patent. ACA, MCA, and PCA branches again demonstrate multiple areas of irregular narrowing, however evaluation for progression since  the prior study is not possible due to motion artifact on the current exam. There is no evidence of new major vessel occlusion. No intracranial aneurysm is identified.  IMPRESSION: Suboptimal examination due to moderate to severe motion artifact.  1. No evidence of acute infarct or other acute intracranial abnormality. 2. Extensive chronic microvascular ischemic disease with progression of left mid brain ischemic change. 3. Persistent nonvisualization of the distal right vertebral artery, which may reflect occlusion or possibly severe hypoplasia. 4. Intracranial arterial irregularity consistent with atherosclerotic disease. Evaluation for interval change from the prior study is limited due to marked motion artifact on this exam.   Electronically Signed   By: Sebastian Ache   On: 07/13/2013 12:25   Dg Chest Port 1 View  07/13/2013   CLINICAL DATA:  79 year old female altered mental status. Initial encounter.  EXAM: PORTABLE CHEST - 1 VIEW  COMPARISON:  09/03/2012 and earlier.  FINDINGS: Portable AP upright view at 0655 hrs. Lower lung volumes. Normal cardiac size and mediastinal contours. Visualized tracheal air column is within normal limits. No pneumothorax, pulmonary edema, pleural effusion or consolidation. Mild increased left basilar opacity most resembles atelectasis.  IMPRESSION: Lower lung volumes with mild basilar atelectasis.   Electronically Signed   By: Augusto Gamble M.D.   On: 07/13/2013 07:16   Mr Maxine Glenn Head/brain Wo Cm  07/13/2013   CLINICAL DATA:  Altered mental status.  TIA.  EXAM: MRI HEAD WITHOUT CONTRAST  MRA HEAD WITHOUT CONTRAST  TECHNIQUE: Multiplanar, multiecho pulse sequences of the brain and surrounding structures were obtained without intravenous contrast. Angiographic images of the head were obtained using MRA technique without contrast.  COMPARISON:  Head CT 07/13/2013 and brain MRI/MRA 01/07/2012  FINDINGS: MRI HEAD FINDINGS  Images are moderately degraded by motion artifact.  T2  hyperintensity is again seen within the pons and midbrain, consistent with chronic ischemia. There is evidence of progressive, nonacute ischemic change in the left cerebral peduncle since the prior study. T2 hyperintensities in the subcortical and deep cerebral white matter bilaterally are similar to the prior study and consistent with moderate chronic small vessel ischemic disease. Small remote basal ganglia and thalamic lacunar infarcts are again noted. There is moderate generalized cerebral atrophy. There is no evidence of acute infarct, intracranial hemorrhage, mass, midline shift, or extra-axial fluid collection. Prior bilateral cataract surgery is noted. There is minimal bilateral ethmoid air cell mucosal thickening.  MRA HEAD FINDINGS  Images are moderately to severely degraded by motion artifact. No flow was again identified in the distal right vertebral artery. The distal left vertebral artery and basilar artery are widely patent. Superior cerebellar arteries are patent bilaterally. PCAs are patent bilaterally. Posterior communicating arteries are not identified. Internal carotid arteries are patent from skullbase to carotid terminus. ACA and MCA origins are patent. ACA, MCA, and PCA branches again demonstrate multiple areas of irregular narrowing, however evaluation for progression since the prior study is not possible due to motion artifact on the current exam. There is no evidence of new major vessel occlusion. No intracranial aneurysm is identified.  IMPRESSION: Suboptimal examination due to moderate to severe motion artifact.  1. No evidence of acute infarct or other acute intracranial abnormality. 2. Extensive chronic microvascular ischemic disease with progression of left mid brain ischemic change. 3. Persistent nonvisualization of  the distal right vertebral artery, which may reflect occlusion or possibly severe hypoplasia. 4. Intracranial arterial irregularity consistent with atherosclerotic  disease. Evaluation for interval change from the prior study is limited due to marked motion artifact on this exam.   Electronically Signed   By: Sebastian Ache   On: 07/13/2013 12:25    Medications:  Scheduled: . acyclovir  10 mg/kg (Ideal) Intravenous Q8H  . atorvastatin  10 mg Oral q1800  . cefTRIAXone (ROCEPHIN)  IV  2 g Intravenous Q12H  . cefTRIAXone (ROCEPHIN)  IV  2 g Intravenous Once  . insulin aspart  0-9 Units Subcutaneous TID WC  . pantoprazole  40 mg Oral QPM  . vancomycin  750 mg Intravenous Q24H    Assessment/Plan: 1. Mental status changes and trouble walking..no acute stroke on MRI   LP being considered although she is already on ab coverage await neurology opinion 2. HTN  Good control 3. Dm cbg 140 good control 4. Shingles acyclovir started  LOS: 1 day   [facility] 07/14/2013, 11:15 AM

## 2013-07-14 NOTE — Progress Notes (Signed)
  Echocardiogram 2D Echocardiogram has been performed.  Megan [name] M 07/14/2013, 11:03 AM

## 2013-07-14 NOTE — Progress Notes (Signed)
INITIAL NUTRITION ASSESSMENT  Pt meets criteria for severe MALNUTRITION in the context of chronic illness as evidenced by <75% estimated energy intake with 5.6% weight loss in the past month.  DOCUMENTATION CODES Per approved criteria  -Severe malnutrition in the context of chronic illness   INTERVENTION: - Ensure Complete BID - Offered to order snacks however pt not interested at this time - Unit RD to continue to monitor   NUTRITION DIAGNOSIS: Unintended weight loss related to poor appetite as evidenced by weight trend.   Goal: Pt to consume >90% of meals/supplements  Monitor:  Weights, labs, intake  Reason for Assessment: MST  80 y.o. female  Admitting Dx: TIA (transient ischemic attack)  ASSESSMENT: Pt is a 79 y.o. female has a past medical history significant for prior CVA with residual right-sided weakness, hypertension, diabetes, hyperlipidemia, history of seizures, presents to the emergency room with a chief complaint of right-sided weakness and altered mental status.  Met with pt and daughter who report pt with variable intake PTA, was eating small amounts, and would sometimes drink Ensure. Pt's weight down 7 pounds in the past month. Did not like most of the items on clear liquid breakfast tray. Noted pt with mild/moderate wasting in clavicles. Daughter interested in pt getting Ensure.   Height: Ht Readings from Last 1 Encounters:  07/13/13 5\' 2"  (1.575 m)    Weight: Wt Readings from Last 1 Encounters:  07/13/13 118 lb 13.3 oz (53.9 kg)    Ideal Body Weight: 110 lb  % Ideal Body Weight: 107%  Wt Readings from Last 10 Encounters:  07/13/13 118 lb 13.3 oz (53.9 kg)  06/25/13 125 lb (56.7 kg)  09/05/12 121 lb 8 oz (55.112 kg)  01/07/12 129 lb 6.6 oz (58.7 kg)    Usual Body Weight: 125 lb earlier this month  % Usual Body Weight: 94%  BMI:  Body mass index is 21.73 kg/(m^2).  Estimated Nutritional Needs: Kcal: 1350-1550 Protein: 55-65g Fluid:  1.3-1.5L/day  Skin: +1 RLE edema  Diet Order: Carb Control  EDUCATION NEEDS: -No education needs identified at this time  No intake or output data in the 24 hours ending 07/14/13 1637  Last BM: 11/21  Labs:   Recent Labs Lab 07/13/13 0706 07/14/13 0735  NA 133* 131*  K 4.1 3.7  CL 99 96  CO2 22 24  BUN 14 6  CREATININE 0.75 0.56  CALCIUM 10.0 9.0  GLUCOSE 153* 158*    CBG (last 3)   Recent Labs  07/13/13 2200 07/14/13 0635 07/14/13 1148  GLUCAP 149* 140* 143*    Scheduled Meds: . acyclovir  10 mg/kg (Ideal) Intravenous Q8H  . atorvastatin  10 mg Oral q1800  . cefTRIAXone (ROCEPHIN)  IV  2 g Intravenous Q12H  . cefTRIAXone (ROCEPHIN)  IV  2 g Intravenous Once  . insulin aspart  0-9 Units Subcutaneous TID WC  . pantoprazole  40 mg Oral QPM  . vancomycin  750 mg Intravenous Q24H    Continuous Infusions: . sodium chloride Stopped (07/13/13 0844)  . sodium chloride 50 mL/hr at 07/14/13 0309    Past Medical History  Diagnosis Date  . Stroke   . Diabetes mellitus   . Hyperlipidemia   . Seizure     History reviewed. No pertinent past surgical history.  Levon Hedger MS, RD, LDN [phone] Weekend/After Hours Pager

## 2013-07-15 DIAGNOSIS — E43 Unspecified severe protein-calorie malnutrition: Secondary | ICD-10-CM | POA: Diagnosis present

## 2013-07-15 LAB — GLUCOSE, CAPILLARY
Glucose-Capillary: 158 mg/dL — ABNORMAL HIGH (ref 70–99)
Glucose-Capillary: 224 mg/dL — ABNORMAL HIGH (ref 70–99)
Glucose-Capillary: 239 mg/dL — ABNORMAL HIGH (ref 70–99)

## 2013-07-15 LAB — CBC WITH DIFFERENTIAL/PLATELET
Basophils Absolute: 0 10*3/uL (ref 0.0–0.1)
Basophils Relative: 0 % (ref 0–1)
Eosinophils Absolute: 0 10*3/uL (ref 0.0–0.7)
Eosinophils Relative: 0 % (ref 0–5)
HCT: 27.2 % — ABNORMAL LOW (ref 36.0–46.0)
Hemoglobin: 9.7 g/dL — ABNORMAL LOW (ref 12.0–15.0)
Lymphocytes Relative: 29 % (ref 12–46)
Lymphs Abs: 1.7 10*3/uL (ref 0.7–4.0)
MCH: 32 pg (ref 26.0–34.0)
MCHC: 35.7 g/dL (ref 30.0–36.0)
Monocytes Absolute: 0.9 10*3/uL (ref 0.1–1.0)
Neutro Abs: 3.1 10*3/uL (ref 1.7–7.7)
Neutrophils Relative %: 56 % (ref 43–77)
RBC: 3.03 MIL/uL — ABNORMAL LOW (ref 3.87–5.11)

## 2013-07-15 MED ORDER — SALINE SPRAY 0.65 % NA SOLN
1.0000 | NASAL | Status: DC | PRN
  Administered 2013-07-15: 1 via NASAL
  Filled 2013-07-15: qty 44

## 2013-07-15 MED ORDER — ROSUVASTATIN CALCIUM 5 MG PO TABS
5.0000 mg | ORAL_TABLET | ORAL | Status: DC
  Administered 2013-07-16 – 2013-07-20 (×3): 5 mg via ORAL
  Filled 2013-07-15 (×3): qty 1

## 2013-07-15 MED ORDER — DEXTROSE 5 % IV SOLN
10.0000 mg/kg | Freq: Two times a day (BID) | INTRAVENOUS | Status: DC
  Administered 2013-07-15 – 2013-07-16 (×3): 500 mg via INTRAVENOUS
  Filled 2013-07-15 (×4): qty 10

## 2013-07-15 NOTE — Progress Notes (Signed)
The patient sees Dr Jethro Bolus as her Opthalmologist . I spoke to Dr Charlotte Sanes on call today and as long as no opthalmologic pain or extension of rash below left eyebrow, she would suggest out patient follow up where they can do good exam and now to continue acyclovir

## 2013-07-15 NOTE — Progress Notes (Signed)
Subjective: Good night denies pain  Objective: Vital signs in last 24 hours: Temp:  [98.1 F (36.7 C)-101.3 F (38.5 C)] 98.1 F (36.7 C) (11/23 0600) Pulse Rate:  [82-96] 86 (11/23 0600) Resp:  [18] 18 (11/23 0600) BP: (127-155)/(57-66) 127/57 mmHg (11/23 0600) SpO2:  [97 %-100 %] 98 % (11/23 0600) Weight change:  Last BM Date: 07/13/13  Intake/Output from previous day:   Intake/Output this shift:    Resp: clear to auscultation bilaterally Cardio: regular rate and rhythm, S1, S2 normal, no murmur, click, rub or gallop Skin: Skin color, texture, turgor normal. No rashes or lesions or erythematous rash left forehead now with small vessicles  Lab Results:  Recent Labs  07/13/13 0706 07/14/13 0735  WBC 5.5 6.0  HGB 9.8* 9.2*  HCT 29.3* 26.9*  PLT 241 255   BMET  Recent Labs  07/13/13 0706 07/14/13 0735  NA 133* 131*  K 4.1 3.7  CL 99 96  CO2 22 24  GLUCOSE 153* 158*  BUN 14 6  CREATININE 0.75 0.56  CALCIUM 10.0 9.0    Studies/Results: Ct Head Wo Contrast (only If Suspected Head Trauma And/or Pt Is On Anticoagulant)  07/13/2013   CLINICAL DATA:  Altered mental status.  EXAM: CT HEAD WITHOUT CONTRAST  TECHNIQUE: Contiguous axial images were obtained from the base of the skull through the vertex without intravenous contrast.  COMPARISON:  09/04/2012  FINDINGS: No mass lesion. No midline shift. No acute hemorrhage or hematoma. No extra-axial fluid collections. No evidence of acute infarction.  There is diffuse cerebral cortical and cerebellar atrophy with periventricular white matter lucency consistent with small vessel ischemic disease, unchanged since the prior exam. No osseous abnormality.  IMPRESSION: No acute abnormality. Chronic atrophy and chronic small vessel ischemic disease.   Electronically Signed   By: Geanie Cooley M.D.   On: 07/13/2013 08:02   Mri Brain Without Contrast  07/13/2013   CLINICAL DATA:  Altered mental status.  TIA.  EXAM: MRI HEAD WITHOUT  CONTRAST  MRA HEAD WITHOUT CONTRAST  TECHNIQUE: Multiplanar, multiecho pulse sequences of the brain and surrounding structures were obtained without intravenous contrast. Angiographic images of the head were obtained using MRA technique without contrast.  COMPARISON:  Head CT 07/13/2013 and brain MRI/MRA 01/07/2012  FINDINGS: MRI HEAD FINDINGS  Images are moderately degraded by motion artifact.  T2 hyperintensity is again seen within the pons and midbrain, consistent with chronic ischemia. There is evidence of progressive, nonacute ischemic change in the left cerebral peduncle since the prior study. T2 hyperintensities in the subcortical and deep cerebral white matter bilaterally are similar to the prior study and consistent with moderate chronic small vessel ischemic disease. Small remote basal ganglia and thalamic lacunar infarcts are again noted. There is moderate generalized cerebral atrophy. There is no evidence of acute infarct, intracranial hemorrhage, mass, midline shift, or extra-axial fluid collection. Prior bilateral cataract surgery is noted. There is minimal bilateral ethmoid air cell mucosal thickening.  MRA HEAD FINDINGS  Images are moderately to severely degraded by motion artifact. No flow was again identified in the distal right vertebral artery. The distal left vertebral artery and basilar artery are widely patent. Superior cerebellar arteries are patent bilaterally. PCAs are patent bilaterally. Posterior communicating arteries are not identified. Internal carotid arteries are patent from skullbase to carotid terminus. ACA and MCA origins are patent. ACA, MCA, and PCA branches again demonstrate multiple areas of irregular narrowing, however evaluation for progression since the prior study is not possible due  to motion artifact on the current exam. There is no evidence of new major vessel occlusion. No intracranial aneurysm is identified.  IMPRESSION: Suboptimal examination due to moderate to  severe motion artifact.  1. No evidence of acute infarct or other acute intracranial abnormality. 2. Extensive chronic microvascular ischemic disease with progression of left mid brain ischemic change. 3. Persistent nonvisualization of the distal right vertebral artery, which may reflect occlusion or possibly severe hypoplasia. 4. Intracranial arterial irregularity consistent with atherosclerotic disease. Evaluation for interval change from the prior study is limited due to marked motion artifact on this exam.   Electronically Signed   By: Sebastian Ache   On: 07/13/2013 12:25   Mr Maxine Glenn Head/brain Wo Cm  07/13/2013   CLINICAL DATA:  Altered mental status.  TIA.  EXAM: MRI HEAD WITHOUT CONTRAST  MRA HEAD WITHOUT CONTRAST  TECHNIQUE: Multiplanar, multiecho pulse sequences of the brain and surrounding structures were obtained without intravenous contrast. Angiographic images of the head were obtained using MRA technique without contrast.  COMPARISON:  Head CT 07/13/2013 and brain MRI/MRA 01/07/2012  FINDINGS: MRI HEAD FINDINGS  Images are moderately degraded by motion artifact.  T2 hyperintensity is again seen within the pons and midbrain, consistent with chronic ischemia. There is evidence of progressive, nonacute ischemic change in the left cerebral peduncle since the prior study. T2 hyperintensities in the subcortical and deep cerebral white matter bilaterally are similar to the prior study and consistent with moderate chronic small vessel ischemic disease. Small remote basal ganglia and thalamic lacunar infarcts are again noted. There is moderate generalized cerebral atrophy. There is no evidence of acute infarct, intracranial hemorrhage, mass, midline shift, or extra-axial fluid collection. Prior bilateral cataract surgery is noted. There is minimal bilateral ethmoid air cell mucosal thickening.  MRA HEAD FINDINGS  Images are moderately to severely degraded by motion artifact. No flow was again identified in the  distal right vertebral artery. The distal left vertebral artery and basilar artery are widely patent. Superior cerebellar arteries are patent bilaterally. PCAs are patent bilaterally. Posterior communicating arteries are not identified. Internal carotid arteries are patent from skullbase to carotid terminus. ACA and MCA origins are patent. ACA, MCA, and PCA branches again demonstrate multiple areas of irregular narrowing, however evaluation for progression since the prior study is not possible due to motion artifact on the current exam. There is no evidence of new major vessel occlusion. No intracranial aneurysm is identified.  IMPRESSION: Suboptimal examination due to moderate to severe motion artifact.  1. No evidence of acute infarct or other acute intracranial abnormality. 2. Extensive chronic microvascular ischemic disease with progression of left mid brain ischemic change. 3. Persistent nonvisualization of the distal right vertebral artery, which may reflect occlusion or possibly severe hypoplasia. 4. Intracranial arterial irregularity consistent with atherosclerotic disease. Evaluation for interval change from the prior study is limited due to marked motion artifact on this exam.   Electronically Signed   By: Sebastian Ache   On: 07/13/2013 12:25    Medications:  Scheduled: . acyclovir  10 mg/kg (Ideal) Intravenous Q8H  . atorvastatin  10 mg Oral q1800  . cefTRIAXone (ROCEPHIN)  IV  2 g Intravenous Q12H  . cefTRIAXone (ROCEPHIN)  IV  2 g Intravenous Once  . feeding supplement (ENSURE COMPLETE)  237 mL Oral BID BM  . insulin aspart  0-9 Units Subcutaneous TID WC  . pantoprazole  40 mg Oral QPM  . vancomycin  750 mg Intravenous Q24H    Assessment/Plan:  1. Mental status appears to be at baseline according to family, appreciate neuro help 2. Malnutrition continue to encourage good caloric intake 3. Shingles opthalmologic distribution will have opthalmology see 4. Dm cbg 160 fair control  5. htn  good control check lab in am  LOS: 2 days   [facility] 07/15/2013, 7:55 AM

## 2013-07-15 NOTE — ED Provider Notes (Signed)
Medical screening examination/treatment/procedure(s) were conducted as a shared visit with non-physician practitioner(s) and myself.  I personally evaluated the patient during the encounter.  EKG Interpretation    Date/Time:  Friday July 13 2013 06:40:21 EST Ventricular Rate:  98 PR Interval:  198 QRS Duration: 69 QT Interval:  306 QTC Calculation: 391 R Axis:     Text Interpretation:  Normal sinus rhythm Low voltage, precordial leads No significant change since last tracing Confirmed by Haylin Camilli  MD, Avacyn Kloosterman ([id number]) on 07/13/2013 8:05:16 AM            Patient with ?AMS, difficult to assess due to poor hearing and old CVA. Patient with what appears to be shingles on forehead, neg eye stain. Will cover with abx and acyclovir. Will admit to medicine. Given possible AMS, neuro recommends LP in setting of neg MRI, but family refuses.   Audree Camel, MD 07/15/13 [phone]

## 2013-07-16 LAB — BASIC METABOLIC PANEL
BUN: 7 mg/dL (ref 6–23)
Calcium: 8.9 mg/dL (ref 8.4–10.5)
Chloride: 99 mEq/L (ref 96–112)
Creatinine, Ser: 0.6 mg/dL (ref 0.50–1.10)
GFR calc Af Amer: >90 mL/min (ref >90)
GFR calc non Af Amer: 78 mL/min — ABNORMAL LOW (ref >90)
Glucose, Bld: 147 mg/dL — ABNORMAL HIGH (ref 70–99)
Sodium: 135 mEq/L (ref 135–145)

## 2013-07-16 LAB — GLUCOSE, CAPILLARY: Glucose-Capillary: 228 mg/dL — ABNORMAL HIGH (ref 70–99)

## 2013-07-16 MED ORDER — ACETAMINOPHEN 325 MG PO TABS
650.0000 mg | ORAL_TABLET | Freq: Four times a day (QID) | ORAL | Status: DC | PRN
  Administered 2013-07-16 – 2013-07-20 (×3): 650 mg via ORAL
  Filled 2013-07-16 (×2): qty 2

## 2013-07-16 MED ORDER — ASPIRIN-DIPYRIDAMOLE ER 25-200 MG PO CP12
1.0000 | ORAL_CAPSULE | Freq: Two times a day (BID) | ORAL | Status: DC
  Administered 2013-07-16 – 2013-07-22 (×13): 1 via ORAL
  Filled 2013-07-16 (×15): qty 1

## 2013-07-16 MED ORDER — POTASSIUM CHLORIDE CRYS ER 20 MEQ PO TBCR
20.0000 meq | EXTENDED_RELEASE_TABLET | Freq: Two times a day (BID) | ORAL | Status: AC
  Administered 2013-07-16 (×2): 20 meq via ORAL
  Filled 2013-07-16 (×2): qty 1

## 2013-07-16 MED ORDER — METFORMIN HCL 500 MG PO TABS
500.0000 mg | ORAL_TABLET | Freq: Two times a day (BID) | ORAL | Status: DC
  Administered 2013-07-16 – 2013-07-22 (×13): 500 mg via ORAL
  Filled 2013-07-16 (×16): qty 1

## 2013-07-16 NOTE — Progress Notes (Addendum)
ANTIBIOTIC CONSULT NOTE - FOLLOW UP  Pharmacy Consult for Acyclovir Indication: Shingles + Shingles opthalmologic distribution   No Known Allergies  Patient Measurements: Height: 5\' 2"  (157.5 cm) Weight: 118 lb 13.3 oz (53.9 kg) IBW/kg (Calculated) : 50.1 Adjusted Body Weight:    Vital Signs: Temp: 98.6 F (37 C) (11/24 0618) Temp src: Oral (11/24 0618) BP: 133/72 mmHg (11/24 0618) Pulse Rate: 72 (11/24 0618) Intake/Output from previous day: 11/23 0701 - 11/24 0700 In: -  Out: 1 [Stool:1] Intake/Output from this shift:    Labs:  Recent Labs  07/14/13 0735 07/15/13 1135 07/16/13 0638  WBC 6.0 5.7  --   HGB 9.2* 9.7*  --   PLT 255 239  --   CREATININE 0.56  --  0.60   Estimated Creatinine Clearance: 37.7 ml/min (by C-G formula based on Cr of 0.6). No results found for this basename: VANCOTROUGH, VANCOPEAK, VANCORANDOM, GENTTROUGH, GENTPEAK, GENTRANDOM, TOBRATROUGH, TOBRAPEAK, TOBRARND, AMIKACINPEAK, AMIKACINTROU, AMIKACIN,  in the last 72 hours   Microbiology: Recent Results (from the past 720 hour(s))  CULTURE, BLOOD (ROUTINE X 2)     Status: None   Collection Time    07/13/13  7:15 AM      Result Value Range Status   Specimen Description BLOOD RIGHT ANTECUBITAL   Final   Special Requests BOTTLES DRAWN AEROBIC AND ANAEROBIC 10CC   Final   Culture  Setup Time     Final   Value: 07/13/2013 12:59     Performed at [facility]   Culture     Final   Value:        BLOOD CULTURE RECEIVED NO GROWTH TO DATE CULTURE WILL BE HELD FOR 5 DAYS BEFORE ISSUING A FINAL NEGATIVE REPORT     Performed at [facility]   Report Status PENDING   Incomplete  CULTURE, BLOOD (ROUTINE X 2)     Status: None   Collection Time    07/13/13  7:30 AM      Result Value Range Status   Specimen Description BLOOD RIGHT HAND   Final   Special Requests BOTTLES DRAWN AEROBIC AND ANAEROBIC   Final   Culture  Setup Time     Final   Value: 07/13/2013 12:59     Performed at  [facility]   Culture     Final   Value:        BLOOD CULTURE RECEIVED NO GROWTH TO DATE CULTURE WILL BE HELD FOR 5 DAYS BEFORE ISSUING A FINAL NEGATIVE REPORT     Performed at [facility]   Report Status PENDING   Incomplete  URINE CULTURE     Status: None   Collection Time    07/13/13  8:20 AM      Result Value Range Status   Specimen Description URINE, CATHETERIZED   Final   Special Requests NONE   Final   Culture  Setup Time     Final   Value: 07/13/2013 09:21     Performed at [facility] Count     Final   Value: NO GROWTH     Performed at [facility]   Culture     Final   Value: NO GROWTH     Performed at [facility]   Report Status 07/14/2013 FINAL   Final    Anti-infectives   Start     Dose/Rate Route Frequency Ordered Stop   07/15/13 2200  acyclovir (ZOVIRAX)  500 mg in dextrose 5 % 100 mL IVPB     10 mg/kg  50.1 kg (Ideal) 110 mL/hr over 60 Minutes Intravenous Every 12 hours 07/15/13 1437     07/14/13 0900  vancomycin (VANCOCIN) IVPB 750 mg/150 ml premix  Status:  Discontinued     750 mg 150 mL/hr over 60 Minutes Intravenous Every 24 hours 07/13/13 0944 07/16/13 0755   07/14/13 0500  cefTRIAXone (ROCEPHIN) 2 g in dextrose 5 % 50 mL IVPB  Status:  Discontinued     2 g 100 mL/hr over 30 Minutes Intravenous Every 12 hours 07/13/13 1014 07/16/13 0755   07/13/13 1900  acyclovir (ZOVIRAX) 250 mg in dextrose 5 % 100 mL IVPB  Status:  Discontinued     250 mg 105 mL/hr over 60 Minutes Intravenous Every 8 hours 07/13/13 0944 07/13/13 1021   07/13/13 1900  acyclovir (ZOVIRAX) 500 mg in dextrose 5 % 100 mL IVPB  Status:  Discontinued     10 mg/kg  50.1 kg (Ideal) 110 mL/hr over 60 Minutes Intravenous Every 8 hours 07/13/13 1021 07/15/13 1437   07/13/13 1630  cefTRIAXone (ROCEPHIN) 2 g in dextrose 5 % 50 mL IVPB  Status:  Discontinued     2 g 100 mL/hr over 30 Minutes Intravenous  Once 07/13/13 1620 07/15/13 1435   07/13/13  1600  piperacillin-tazobactam (ZOSYN) IVPB 3.375 g  Status:  Discontinued     3.375 g 12.5 mL/hr over 240 Minutes Intravenous Every 8 hours 07/13/13 0944 07/13/13 1001   07/13/13 1030  acyclovir (ZOVIRAX) 500 mg in dextrose 5 % 100 mL IVPB     10 mg/kg  50.1 kg (Ideal) 110 mL/hr over 60 Minutes Intravenous STAT 07/13/13 1021 07/13/13 1340   07/13/13 1015  cefTRIAXone (ROCEPHIN) 2 g in dextrose 5 % 50 mL IVPB  Status:  Discontinued     2 g 100 mL/hr over 30 Minutes Intravenous STAT 07/13/13 1014 07/13/13 1720   07/13/13 0945  acyclovir (ZOVIRAX) 250 mg in dextrose 5 % 100 mL IVPB  Status:  Discontinued     250 mg 105 mL/hr over 60 Minutes Intravenous  Once 07/13/13 0944 07/13/13 1020   07/13/13 0700  piperacillin-tazobactam (ZOSYN) IVPB 3.375 g     3.375 g 100 mL/hr over 30 Minutes Intravenous  Once 07/13/13 0652 07/13/13 0843   07/13/13 0700  vancomycin (VANCOCIN) IVPB 1000 mg/200 mL premix     1,000 mg 200 mL/hr over 60 Minutes Intravenous  Once 07/13/13 4782 07/13/13 1045      Assessment: 89 YOF arrived from home via EMS with AMS.   Anticoagulation:none pta. SCDs  Infectious Disease: Vanc/Rocephin/Acyclovir D#4 for shingles + meningitis + r/o encephalitis, rash on left forehead, Tmax 99.9, WBC 5.7, LA 1.9. MD doubts meningitis and d/c'd abx. Continue acyclovir  Vanc 11/21 >>11/24 Zosyn 11/21 >> 11/21 CTX 11/21 >>11/24 Acyclovir 11/21 >>  11/21 urine - ng 11/21 bld x2-pending  Cardiovascular: hx HLD - VSS, Meds: Aggrenox, K, Crestor  Endocrinology: hx DM - CBGs 151-239, resumed metformin  Gastrointestinal / Nutrition: LFTs WNL. PCM. Po PPI, Ensure  Neurology: hx CVA / seizure admitted with AMS, head CT negative Shingles opthalmologic distribution-stable will have outpatient follow up. Mental status back to baseline per family.  Nephrology: SCr 0.6, K 3.2 on KDur 20 BID  Pulmonary: on RA  Hematology / Oncology: Hgb 9.7  PTA Medication Issues  Best Practices: SCD,  PPI   Plan:  Acyclovir 500mg  IV Q12H (10mg /kg/dose using IBW) D/c'd  Vanco and Rocephin PO Acyclovir recommendation: 800 mg ORALLY every 4 hours 5 times a day for 7 to 10 days     Marchello Rothgeb S. Merilynn Finland, PharmD, BCPS Clinical Staff Pharmacist Pager [phone]  Misty Stanley Stillinger 07/16/2013,8:31 AM

## 2013-07-16 NOTE — Progress Notes (Signed)
Subjective: No complaints  Objective: Vital signs in last 24 hours: Temp:  [98.1 F (36.7 C)-99.9 F (37.7 C)] 98.6 F (37 C) (11/24 0618) Pulse Rate:  [72-99] 72 (11/24 0618) Resp:  [18-20] 20 (11/24 0618) BP: (133-163)/(56-87) 133/72 mmHg (11/24 0618) SpO2:  [98 %-100 %] 100 % (11/24 0618) Weight change:  Last BM Date: 07/15/13  Intake/Output from previous day: 11/23 0701 - 11/24 0700 In: -  Out: 1 [Stool:1] Intake/Output this shift:    General appearance: alert and cooperative Resp: clear to auscultation bilaterally Cardio: regular rate and rhythm, S1, S2 normal, no murmur, click, rub or gallop  Lab Results:  Recent Labs  07/14/13 0735 07/15/13 1135  WBC 6.0 5.7  HGB 9.2* 9.7*  HCT 26.9* 27.2*  PLT 255 239   BMET  Recent Labs  07/14/13 0735  NA 131*  K 3.7  CL 96  CO2 24  GLUCOSE 158*  BUN 6  CREATININE 0.56  CALCIUM 9.0    Studies/Results: No results found.  Medications: I have reviewed the patient's current medications.  Assessment/Plan: 1. Mental status appears to be at baseline according to family, appreciate neuro help.  Doubt meningitis, will discontinue antibiotics 2. Malnutrition continue to encourage good caloric intake, Ensure  3. Shingles opthalmologic distribution on acyclovir iv 4. Dm cbg  fair control restart metformin 5. htn has been normotensive 6. Disposition,  Ask PT to see today, possible discharge in am   LOS: 3 days   Megan [name] [name] 07/16/2013, 7:50 AM

## 2013-07-16 NOTE — Progress Notes (Signed)
UR completed 

## 2013-07-17 LAB — BASIC METABOLIC PANEL
BUN: 12 mg/dL (ref 6–23)
Calcium: 9.2 mg/dL (ref 8.4–10.5)
GFR calc Af Amer: 51 mL/min — ABNORMAL LOW (ref >90)
GFR calc non Af Amer: 44 mL/min — ABNORMAL LOW (ref >90)
Potassium: 4.2 mEq/L (ref 3.5–5.1)
Sodium: 137 mEq/L (ref 135–145)

## 2013-07-17 LAB — GLUCOSE, CAPILLARY
Glucose-Capillary: 133 mg/dL — ABNORMAL HIGH (ref 70–99)
Glucose-Capillary: 204 mg/dL — ABNORMAL HIGH (ref 70–99)
Glucose-Capillary: 115 mg/dL — ABNORMAL HIGH (ref 70–99)

## 2013-07-17 MED ORDER — VALACYCLOVIR HCL 500 MG PO TABS
1000.0000 mg | ORAL_TABLET | Freq: Two times a day (BID) | ORAL | Status: DC
  Administered 2013-07-17 – 2013-07-20 (×7): 1000 mg via ORAL
  Filled 2013-07-17 (×8): qty 2

## 2013-07-17 NOTE — Evaluation (Signed)
Physical Therapy Evaluation Patient Details Name: Megan [name] MRN: [id number] DOB: 1922/05/21 Today's Date: 07/17/2013 Time: 0801-0850 PT Time Calculation (min): 49 min  PT Assessment / Plan / Recommendation History of Present Illness  pt presents with Shingles and weakness.    Clinical Impression  Pt generally deconditioned and weak.  Per daughter pt went to [facility] after her CVA and would like for her to return there for rehab prior to returning home.  At this point pt is requiring extensive one person A and for safety would be best with 2 people, however at home they are unable to provide this consistently.  If family chooses to take pt home then they will need a Nurse, adult for safety and HHPT.  Lengthy discussion with daughter in room and daughter Dennie Bible from Pharmacy over phone about D/C options and at this time family undecided.  Family also had questions about whether pt was still contagious.  Will continue to follow.     PT Assessment  Patient needs continued PT services    Follow Up Recommendations  [facility]    Does the patient have the potential to tolerate intense rehabilitation      Barriers to Discharge        Equipment Recommendations   Michiel Sites Lift if D/C to home)    Recommendations for Other Services     Frequency Min 2X/week    Precautions / Restrictions Precautions Precautions: Fall Restrictions Weight Bearing Restrictions: No   Pertinent Vitals/Pain Did not indicate pain.        Mobility  Bed Mobility Bed Mobility: Supine to Sit;Sitting - Scoot to Edge of Bed Supine to Sit: 3: Mod assist;HOB elevated Sitting - Scoot to Edge of Bed: 2: Max assist Details for Bed Mobility Assistance: Gestural cues needed 2/2 pt very HOH.   Transfers Transfers: Sit to Stand;Stand to [employer] Transfers Sit to Stand: 2: Max assist;With upper extremity assist;From bed;From chair/3-in-1 Stand to Sit: 2: Max assist;To chair/3-in-1 Stand Pivot Transfers: 2: Max assist Details for  Transfer Assistance: pt able to A some with coming to stand from chair more than bed, however requires extensive A.  pt attempts to take very small pivotal steps to come around to the chair, but seems to be doing more stepping in place and requires PT to perform pivot.  pt needs R LE blocked with coming to stand.   Ambulation/Gait Ambulation/Gait Assistance: Not tested (comment) Stairs: No Wheelchair Mobility Wheelchair Mobility: No    Exercises     PT Diagnosis: Difficulty walking;Generalized weakness  PT Problem List: Decreased strength;Decreased activity tolerance;Decreased balance;Decreased mobility;Decreased cognition;Decreased coordination;Decreased knowledge of use of DME PT Treatment Interventions: DME instruction;Gait training;Functional mobility training;Therapeutic activities;Therapeutic exercise;Balance training;Neuromuscular re-education;Cognitive remediation;Patient/family education     PT Goals(Current goals can be found in the care plan section) Acute Rehab PT Goals Patient Stated Goal: Per daughter for pt to get strong enough to come home.   PT Goal Formulation: With family Time For Goal Achievement: 07/31/13 Potential to Achieve Goals: Fair  Visit Information  Last PT Received On: 07/17/13 Assistance Needed: +2 (would have been helpful) History of Present Illness: pt presents with Shingles and weakness.         Prior Functioning  Home Living Family/patient expects to be discharged to:: Unsure Living Arrangements: Children Additional Comments: uses RW with bed to [facility] transfers; walks short distances with RW; W/c for going out . Daughter states aide helps her onto [facility] in bedroom but daughter will walk her in the  bathroom where it is a tighter space to use 3in1 over toilet. Aide is there M-F 7-1:30 pm Prior Function Level of Independence: Needs assistance Communication Communication: HOH;Expressive difficulties Dominant Hand: Left    Cognition   Cognition Arousal/Alertness:  (Drowsy, but arousable.  ) Behavior During Therapy: Flat affect Overall Cognitive Status: Difficult to assess Difficult to assess due to: Hard of hearing/deaf    Extremity/Trunk Assessment Upper Extremity Assessment Upper Extremity Assessment: Generalized weakness Lower Extremity Assessment Lower Extremity Assessment: Generalized weakness Cervical / Trunk Assessment Cervical / Trunk Assessment: Kyphotic   Balance Balance Balance Assessed: Yes Static Sitting Balance Static Sitting - Balance Support: Bilateral upper extremity supported;Feet supported Static Sitting - Level of Assistance: 5: Stand by assistance  End of Session PT - End of Session Equipment Utilized During Treatment: Gait belt Activity Tolerance: Patient limited by fatigue Patient left: in chair;with call bell/phone within reach;with family/visitor present Nurse Communication: Mobility status  GP     Sunny Schlein, [place] [phone] 07/17/2013, 8:58 AM

## 2013-07-17 NOTE — Progress Notes (Signed)
Rehab Admissions Coordinator Note:  Patient was screened by Megan [name] for appropriateness for an Inpatient Acute Rehab Consult.  At this time, we are recommending Skilled Nursing Facility.  [employer] medicare will not approve an inpt rehab admission without a dx for this pt of a new CVA.  Megan [name] 07/17/2013, 2:13 PM  I can be reached at [phone].

## 2013-07-17 NOTE — Progress Notes (Signed)
Subjective: No complaints  Objective: Vital signs in last 24 hours: Temp:  [98.1 F (36.7 C)-99.6 F (37.6 C)] 98.9 F (37.2 C) (11/25 0430) Pulse Rate:  [80-100] 87 (11/25 0430) Resp:  [18-20] 20 (11/25 0430) BP: (101-141)/(38-77) 141/62 mmHg (11/25 0430) SpO2:  [98 %-100 %] 100 % (11/25 0430) Weight change:  Last BM Date: 07/16/13  Intake/Output from previous day:   Intake/Output this shift:    General appearance: alert and cooperative Resp: clear to auscultation bilaterally Cardio: regular rate and rhythm, S1, S2 normal, no murmur, click, rub or gallop Extremities: extremities normal, atraumatic, no cyanosis or edema  Lab Results:  Recent Labs  07/15/13 1135  WBC 5.7  HGB 9.7*  HCT 27.2*  PLT 239   BMET  Recent Labs  07/16/13 0638  NA 135  K 3.2*  CL 99  CO2 25  GLUCOSE 147*  BUN 7  CREATININE 0.60  CALCIUM 8.9    Studies/Results: No results found.  Medications: I have reviewed the patient's current medications.  Assessment/Plan: 1. Mental status appears to be at baseline according to family, appreciate neuro help. Doubt meningitis, will discontinue antibiotics  2. Malnutrition continue to encourage good caloric intake, Ensure  3. Shingles opthalmologic distribution change acyclovir to po  4. Dm cbg better control restarted metformin  5. htn has been normotensive  6. Disposition, Ask PT to see today, possible discharge today depending on this evaluation   LOS: 4 days   Megan [name] [name] 07/17/2013, 7:42 AM

## 2013-07-18 LAB — BASIC METABOLIC PANEL
CO2: 27 mEq/L (ref 19–32)
Calcium: 9.4 mg/dL (ref 8.4–10.5)
Chloride: 101 mEq/L (ref 96–112)
Creatinine, Ser: 0.9 mg/dL (ref 0.50–1.10)
GFR calc Af Amer: 64 mL/min — ABNORMAL LOW (ref >90)
Potassium: 3.8 mEq/L (ref 3.5–5.1)

## 2013-07-18 LAB — GLUCOSE, CAPILLARY
Glucose-Capillary: 118 mg/dL — ABNORMAL HIGH (ref 70–99)
Glucose-Capillary: 201 mg/dL — ABNORMAL HIGH (ref 70–99)

## 2013-07-18 NOTE — Clinical Social Work Note (Signed)
CSW attempted to assess pt for SNF placement, however pt is not oriented enough to complete assessment. CSW attempted to call both of the pt's daughters listed on facesheet. Pt's daughter, Mishael Krysiak, was unavailable with no voicemail option. Pt's daughter, Roney Marion, did not answer but had a voicemail. CSW left a message with Roney Marion and is currently awaiting a phone call from her to discuss SNF placement.  CSW to continue to follow and assist with discharge planning needs.  Darlyn Chamber, LCSWA Clinical Social Worker [phone]

## 2013-07-18 NOTE — Progress Notes (Signed)
NUTRITION FOLLOW UP  DOCUMENTATION CODES Per approved criteria  -Severe malnutrition in the context of chronic illness   Intervention:    Continue Ensure Complete twice daily (350 kcals, 13 gm protein per 8 fl oz bottle) RD to follow for nutrition care plan  Nutrition Dx:   Unintended weight loss related to poor appetite as evidenced by weight trend, ongoing  Goal:   Pt to meet >/= 90% of their estimated nutrition needs, progressing  Monitor:   PO & supplemental intake, weight, labs, I/O's  Assessment:   Patient with PMH of prior CVA with residual right-sided weakness, HTN, DM, hyperlipidemia, seizures; presented to ER with a chief complaint of right-sided weakness and altered mental status.  Patient's mental status at baseline.  PO intake mostly good at 65-80% per flowsheet records.  Ensure Complete supplements in place twice daily.  Noted patient not appropriate for Inpatient Rehab at this time.  Dispo: SNF.  Height: Ht Readings from Last 1 Encounters:  07/13/13 5\' 2"  (1.575 m)    Weight Status:   Wt Readings from Last 1 Encounters:  07/13/13 118 lb 13.3 oz (53.9 kg)    Re-estimated needs:  Kcal: 1350-1550 Protein: 55-65 gm Fluid: >/= 1.5 L  Skin: Intact  Diet Order: Carb Control   Intake/Output Summary (Last 24 hours) at 07/18/13 1300 Last data filed at 07/18/13 1025  Gross per 24 hour  Intake    680 ml  Output      0 ml  Net    680 ml    Labs:   Recent Labs Lab 07/16/13 0638 07/17/13 0645 07/18/13 0935  NA 135 137 136  K 3.2* 4.2 3.8  CL 99 102 101  CO2 25 23 27   BUN 7 12 11   CREATININE 0.60 1.12* 0.90  CALCIUM 8.9 9.2 9.4  GLUCOSE 147* 138* 234*    CBG (last 3)   Recent Labs  07/17/13 2232 07/18/13 0643 07/18/13 1140  GLUCAP 115* 118* 201*    Scheduled Meds: . dipyridamole-aspirin  1 capsule Oral BID  . feeding supplement (ENSURE COMPLETE)  237 mL Oral BID BM  . insulin aspart  0-9 Units Subcutaneous TID WC  . metFORMIN  500 mg  Oral BID WC  . pantoprazole  40 mg Oral QPM  . rosuvastatin  5 mg Oral Q M,W,F  . valACYclovir  1,000 mg Oral BID    Continuous Infusions:   Maureen Chatters, RD, LDN Pager #: [phone] After-Hours Pager #: [phone]

## 2013-07-18 NOTE — Progress Notes (Signed)
Subjective: No new complaints  Objective: Vital signs in last 24 hours: Temp:  [98.4 F (36.9 C)-99.2 F (37.3 C)] 98.5 F (36.9 C) (11/26 1610) Pulse Rate:  [78-102] 78 (11/26 0613) Resp:  [16-20] 16 (11/26 9604) BP: (108-163)/(48-70) 152/56 mmHg (11/26 0613) SpO2:  [100 %] 100 % (11/26 5409) Weight change:  Last BM Date: 07/17/13  Intake/Output from previous day: 11/25 0701 - 11/26 0700 In: 520 [P.O.:520] Out: -  Intake/Output this shift:    General appearance: alert Resp: clear to auscultation bilaterally Cardio: regular rate and rhythm, S1, S2 normal, no murmur, click, rub or gallop  Lab Results:  Recent Labs  07/15/13 1135  WBC 5.7  HGB 9.7*  HCT 27.2*  PLT 239   BMET  Recent Labs  07/16/13 0638 07/17/13 0645  NA 135 137  K 3.2* 4.2  CL 99 102  CO2 25 23  GLUCOSE 147* 138*  BUN 7 12  CREATININE 0.60 1.12*  CALCIUM 8.9 9.2    Studies/Results: No results found.  Medications: I have reviewed the patient's current medications.  Assessment/Plan:  1. Malnutrition continue to encourage good caloric intake, Ensure  2. Shingles opthalmologic distribution change acyclovir to po  3. Dm cbg better control restarted metformin  4. Rise in creatinine, recheck today 5. htn has been normotensive  6. Disposition, as per PT will pursue [facility] for rehab unless significant improvement in mobility by firday.  Not a candidate for inpatient rehab.    LOS: 5 days  . Iva Posten [name] 07/18/2013, 7:28 AM

## 2013-07-18 NOTE — Clinical Social Work Psychosocial (Signed)
Clinical Social Work Department BRIEF PSYCHOSOCIAL ASSESSMENT 07/18/2013  Patient:  Megan [name], Megan [name]     Account Number:  [id number]     Admit date:  07/13/2013  Clinical Social Worker:  Sherre Lain  Date/Time:  07/18/2013 03:31 PM  Referred by:  Physician  Date Referred:  07/18/2013 Referred for  SNF Placement   Other Referral:   none.   Interview type:  Family Other interview type:   CSW spoke to pt's daughter at bedside.    PSYCHOSOCIAL DATA Living Status:  ALONE Admitted from facility:   Level of care:   Primary support name:  Roswell Nickel Primary support relationship to patient:  CHILD, ADULT Degree of support available:   Strong support system.    CURRENT CONCERNS Current Concerns  Post-Acute Placement   Other Concerns:   none.    SOCIAL WORK ASSESSMENT / PLAN CSW met with pt's daughter at bedside. CSW explained role at [facility] to pt's daughter. Pt's daughter agreeable to speaking with CSW. CSW spoke with pt's daughter regarding SNF placement recommendation. Pt's daughter agreeable to SNF placement. CSW to present bed offers to pt's daughter on Friday 07/20/2013. CSW to continue to follow and assist with discharge planning needs.   Assessment/plan status:  Psychosocial Support/Ongoing Assessment of Needs Other assessment/ plan:   none.   Information/referral to community resources:   [facility] SNF placement bed offers.    PATIENTS/FAMILYS RESPONSE TO PLAN OF CARE: Pt's daughter was understanding and agreeable to CSW plan of care.     Darlyn Chamber, LCSWA Clinical Social Worker [phone]

## 2013-07-18 NOTE — Clinical Social Work Placement (Signed)
Clinical Social Work Department CLINICAL SOCIAL WORK PLACEMENT NOTE 07/18/2013  Patient:  [name], [name]  Account Number:  [id number] Admit date:  07/13/2013  Clinical Social Worker:  Irving Burton [place], LCSWA  Date/time:  07/18/2013 03:39 PM  Clinical Social Work is seeking post-discharge placement for this patient at the following level of care:   SKILLED NURSING   (*CSW will update this form in Epic as items are completed)   07/18/2013  Patient/family provided with [facility] Department of Clinical Social Works list of facilities offering this level of care within the geographic area requested by the patient (or if unable, by the patients family).  07/18/2013  Patient/family informed of their freedom to choose among providers that offer the needed level of care, that participate in Medicare, Medicaid or managed care program needed by the patient, have an available bed and are willing to accept the patient.  07/18/2013  Patient/family informed of [facility] ownership interest in [facility], as well as of the fact that they are under no obligation to receive care at this facility.  PASARR submitted to EDS on 07/18/2013 PASARR number received from EDS on 07/18/2013  FL2 transmitted to all facilities in geographic area requested by pt/family on  07/18/2013 FL2 transmitted to all facilities within larger geographic area on   Patient informed that his/her managed care company has contracts with or will negotiate with  certain facilities, including the following:     Patient/family informed of bed offers received:   Patient chooses bed at  Physician recommends and patient chooses bed at    Patient to be transferred to  on   Patient to be transferred to facility by   The following physician request were entered in Epic:   Additional Comments:  Darlyn Chamber, Theresia Majors Clinical Social Worker [phone]

## 2013-07-19 LAB — CULTURE, BLOOD (ROUTINE X 2): Culture: NO GROWTH

## 2013-07-19 LAB — GLUCOSE, CAPILLARY: Glucose-Capillary: 160 mg/dL — ABNORMAL HIGH (ref 70–99)

## 2013-07-19 NOTE — Progress Notes (Signed)
Assessment/Plan: Principal Problem:   TIA (transient ischemic attack) - improving functional status. Will need SNF rehab, however.  Active Problems:   Diabetes mellitus - sugars are fine.    HTN (hypertension) - BP is fine (though diastolic is a bit low)   H/O   Seizure   Spastic hemiplegia affecting dominant side   Herpes zoster - healing - no pain    Protein-calorie malnutrition, severe   Subjective: Patient has no complaints.   Family has noted a small tear on the upper buttock. (She was in chair eating breakfast and I did not get her up. RN to check it later when she gets back to bed). Family notes she has been up to [facility] and a bit with walker.   Objective:  Vital Signs: Filed Vitals:   07/18/13 1808 07/18/13 2200 07/19/13 0209 07/19/13 0538  BP: 138/70 125/47 134/44 128/43  Pulse: 83 92 77 82  Temp: 98 F (36.7 C) 98.6 F (37 C) 98.5 F (36.9 C) 99.1 F (37.3 C)  TempSrc: Oral Oral Oral Oral  Resp: 20 20 20 16   Height:      Weight:      SpO2: 100% 100% 99% 96%     EXAM: alert. Comfortable.   Cor: RRR  LUNGS: clear    Intake/Output Summary (Last 24 hours) at 07/19/13 0945 Last data filed at 07/18/13 1810  Gross per 24 hour  Intake    480 ml  Output      0 ml  Net    480 ml    Lab Results:  Recent Labs  07/17/13 0645 07/18/13 0935  NA 137 136  K 4.2 3.8  CL 102 101  CO2 23 27  GLUCOSE 138* 234*  BUN 12 11  CREATININE 1.12* 0.90  CALCIUM 9.2 9.4   No results found for this basename: AST, ALT, ALKPHOS, BILITOT, PROT, ALBUMIN,  in the last 72 hours No results found for this basename: LIPASE, AMYLASE,  in the last 72 hours No results found for this basename: WBC, NEUTROABS, HGB, HCT, MCV, PLT,  in the last 72 hours No results found for this basename: CKTOTAL, CKMB, CKMBINDEX, TROPONINI,  in the last 72 hours No components found with this basename: POCBNP,  No results found for this basename: DDIMER,  in the last 72 hours No results found for this  basename: HGBA1C,  in the last 72 hours No results found for this basename: CHOL, HDL, LDLCALC, TRIG, CHOLHDL, LDLDIRECT,  in the last 72 hours No results found for this basename: TSH, T4TOTAL, FREET3, T3FREE, THYROIDAB,  in the last 72 hours No results found for this basename: VITAMINB12, FOLATE, FERRITIN, TIBC, IRON, RETICCTPCT,  in the last 72 hours  Studies/Results: No results found. Medications: Medications administered in the last 24 hours reviewed.  Current Medication List reviewed.    LOS: 6 days   [facility] Internal Medicine @ [facility]i [facility] [phone]) 07/19/2013, 9:45 AM

## 2013-07-19 NOTE — Progress Notes (Signed)
Dr. Theressa Millard has been informed of pt's abrasion to right lower buttock approximated 1.0cmx1.0cm, no drainage/bleeding noted at this time. Pt alert/awake in no acute distress with no complaints. Barrier cream has been provided and applied to pt's right buttock. Per family-daughter, the abrasion has been going on since pt has been transferred in this unit. Currently pt in chair sitting comfortably, informed family to inform staff if pt decides to go back in bed so we could do a two way turns q2hrs to relieve pressure on the buttocks.

## 2013-07-20 LAB — GLUCOSE, CAPILLARY
Glucose-Capillary: 155 mg/dL — ABNORMAL HIGH (ref 70–99)
Glucose-Capillary: 162 mg/dL — ABNORMAL HIGH (ref 70–99)
Glucose-Capillary: 144 mg/dL — ABNORMAL HIGH (ref 70–99)
Glucose-Capillary: 140 mg/dL — ABNORMAL HIGH (ref 70–99)

## 2013-07-20 MED ORDER — VALACYCLOVIR HCL 1 G PO TABS: 1000.0000 mg | ORAL_TABLET | Freq: Two times a day (BID) | ORAL | Status: DC

## 2013-07-20 MED ORDER — ONDANSETRON HCL 4 MG PO TABS
4.0000 mg | ORAL_TABLET | Freq: Three times a day (TID) | ORAL | Status: DC | PRN
  Administered 2013-07-21: 4 mg via ORAL
  Filled 2013-07-20: qty 1

## 2013-07-20 NOTE — Discharge Summary (Signed)
Physician Discharge Summary  Patient ID: Megan [name] MRN: [id number] DOB/AGE: February 13, 1922 79 y.o.  Admit date: 07/13/2013 Discharge date: 07/20/2013  Admission Diagnoses: Transient ischemic attack Mental status change Diabetes mellitus   Discharge Diagnoses:  Principal Problem:   TIA (transient ischemic attack) Active Problems: Mental status change   Diabetes mellitus   Spastic hemiplegia affecting dominant side   Herpes zoster   Protein-calorie malnutrition, severe   Discharged Condition: good  Hospital Course: The patient was admitted on 07/13/2013 with chief complaint of right-sided weakness and some change in mental status. On the day prior to admission family she was unable to bear weight on the right side with more confusion and lethargy. They also had noticed a rash developing above her left eye. Admission laboratories were unremarkable. CT scan of the brain showed chronic atrophy with no acute abnormality. MRI of the brain showed no evidence of acute infarct, chronic microvascular ischemic and progression of left midbrain ischemic change, nonvisualization of distal right vertebral artery and diffuse intracranial atherosclerosis. Echocardiogram showed no cardiac source of emboli, normal LV function and grade 1 diastolic dysfunction. Carotid ultrasound showed mild stenoses bilateral internal carotid arteries. The patient was seen by neurology who felt the patient had shingles a left for had a low-grade fever. MRI was recommended as above showed no stroke. LP to rule out bacterial meningitis or viral encephalitis was recommended but declined by family. The patient was treated with 2 days of IV antibiotics but those were stopped because of bright low likelihood of meningitis as her mental status quickly improved. She was treated with IV acyclovir and switch to valacyclovir orally for treatment of herpes zoster. Her mental status returned to baseline. She did have some persistent  right-sided weakness. She was seen by physical therapy recommended short-term nursing home placement for rehabilitation, family eventually declined this option and opted for home discharge with home health RN and PT. The patient's blood sugars remained under reasonable control.  Consults: neurology  Significant Diagnostic Studies: labs: , rSodium 136, potassium 3.8, chloride 101, bicarbonate 27, glucose 234, BUN 11, creatinine 0.9adiology: CXR: normal, MRI: As above, CT scan: As above and Ultrasound: Carotid, as above and cardiac graphics: Echocardiogram: As above  Treatments: IV hydration, antibiotics: vancomycin and ceftriaxone, anticoagulation: Aggrenox and therapies: PT  Discharge Exam: Blood pressure 142/44, pulse 74, temperature 98.3 F (36.8 C), temperature source Oral, resp. rate 18, height 5\' 2"  (1.575 m), weight 53.9 kg (118 lb 13.3 oz), SpO2 100.00%. General appearance: alert and cooperative Resp: clear to auscultation bilaterally Cardio: regular rate and rhythm, S1, S2 normal, no murmur, click, rub or gallop  Disposition: 01-Home or Self Care     Medication List         dipyridamole-aspirin 200-25 MG per 12 hr capsule  Commonly known as:  AGGRENOX  Take 1 capsule by mouth 2 (two) times daily.     metFORMIN 500 MG tablet  Commonly known as:  GLUCOPHAGE  Take 1,000 mg by mouth 2 (two) times daily with a meal.     ONE TOUCH ULTRA TEST test strip  Generic drug:  glucose blood     pantoprazole 40 MG tablet  Commonly known as:  PROTONIX  Take 40 mg by mouth every evening.     rosuvastatin 5 MG tablet  Commonly known as:  CRESTOR  Take 5 mg by mouth every Monday, Wednesday, and Friday.     sodium chloride inhaler solution  Commonly known as:  BRONCHO SALINE  Take 2 sprays  by nebulization as needed.     valACYclovir 1000 MG tablet  Commonly known as:  VALTREX  Take 1 tablet (1,000 mg total) by mouth 2 (two) times daily.           Follow-up Information    Follow up with Lillia Mountain, MD In 2 weeks.   Specialty:  Internal Medicine   Contact information:   [address] [place] [address] [phone]       Signed: Lillia Mountain 07/20/2013, 10:28 AM

## 2013-07-20 NOTE — Progress Notes (Signed)
Pt vomited x 2. dtr uncomfortable taking pt home bc she feels  pt not alert enough along with vomiting. T/C to oncall MD Dr Debby Bud.... Discharge cancelled and zofran ordered prn. Barbera Setters RN

## 2013-07-20 NOTE — Care Management Note (Signed)
    Page 1 of 2   07/20/2013     10:58:45 AM   CARE MANAGEMENT NOTE 07/20/2013  Patient:  Megan [name], Megan [name]   Account Number:  [id number]  Date Initiated:  07/16/2013  Documentation initiated by:  Jiles Crocker  Subjective/Objective Assessment:   ADMITTED WITH ALTERED MENTAL STATUS, SHINGLES, RIGHT SIDED WEAKNESS     Action/Plan:   CM FOLLOWING FOR DCP   Anticipated DC Date:  07/19/2013   Anticipated DC Plan:  HOME W HOME HEALTH SERVICES      DC Planning Services  CM consult      Choice offered to / List presented to:  C-4 Adult Children        HH arranged  HH-1 RN  HH-2 PT      HH agency  [facility]   Status of service:  Completed, signed off Medicare Important Message given?   (If response is "NO", the following Medicare IM given date fields will be blank) Date Medicare IM given:   Date Additional Medicare IM given:    Discharge Disposition:  HOME/SELF CARE  Per UR Regulation:  Reviewed for med. necessity/level of care/duration of stay  If discussed at Long Length of Stay Meetings, dates discussed:    Comments:  07/20/13 1030  Elmer Bales RN, MSN, CM- Spoke with Dr Valentina Lucks, who stated that family would like to take patient home with home health.  Met with patient's daughters, who requested [facility], which patient has used in the past. Corrie Dandy with [facility] was notified and accepted the referral for patient discharging home today.    11/24/2014Abelino Derrick RN,BSN,MHA [phone]

## 2013-07-20 NOTE — Progress Notes (Signed)
Physical Therapy Treatment Patient Details Name: Megan [name] MRN: [id number] DOB: [birth year] Today's Date: 07/20/2013 Time: 7829-5621 PT Time Calculation (min): 21 min  PT Assessment / Plan / Recommendation  History of Present Illness pt presents with Shingles and weakness.     PT Comments   Pt demos improved mobility today, though continue to require extensive A for all mobility.  Will continue to follow.    Follow Up Recommendations  SNF     Does the patient have the potential to tolerate intense rehabilitation     Barriers to Discharge        Equipment Recommendations   Michiel Sites lift if D/C to home)    Recommendations for Other Services    Frequency Min 2X/week   Progress towards PT Goals Progress towards PT goals: Progressing toward goals  Plan Discharge plan needs to be updated    Precautions / Restrictions Precautions Precautions: Fall Restrictions Weight Bearing Restrictions: No   Pertinent Vitals/Pain Denied pain.      Mobility  Bed Mobility Bed Mobility: Not assessed Transfers Transfers: Sit to Stand;Stand to Sit Sit to Stand: 1: +2 Total assist;With upper extremity assist;From chair/3-in-1 Sit to Stand: Patient Percentage: 50% Stand to Sit: 1: +2 Total assist;With upper extremity assist;To chair/3-in-1 Stand to Sit: Patient Percentage: 30% Details for Transfer Assistance: Cues and facilitation for UE use, blocking R LE, anterior wt shift with coming to stand and trunk/hip extension.   Ambulation/Gait Ambulation/Gait Assistance: 1: +2 Total assist Ambulation/Gait: Patient Percentage: 60% Ambulation Distance (Feet): 5 Feet (and 3) Assistive device: Rolling walker Ambulation/Gait Assistance Details: cues and facilitation for upright posture, use of RW, encouragment.  pt's LEs start to buckle as she fatigues.   Gait Pattern: Step-through pattern;Decreased stride length;Trunk flexed Stairs: No Wheelchair Mobility Wheelchair Mobility: No    Exercises     PT  Diagnosis:    PT Problem List:   PT Treatment Interventions:     PT Goals (current goals can now be found in the care plan section) Acute Rehab PT Goals Patient Stated Goal: Per daughter for pt to get strong enough to come home.   Time For Goal Achievement: 07/31/13 Potential to Achieve Goals: Fair  Visit Information  Last PT Received On: 07/20/13 Assistance Needed: +2 History of Present Illness: pt presents with Shingles and weakness.      Subjective Data  Patient Stated Goal: Per daughter for pt to get strong enough to come home.     Cognition  Cognition Arousal/Alertness: Awake/alert Behavior During Therapy: Flat affect Overall Cognitive Status: Difficult to assess Difficult to assess due to: Hard of hearing/deaf    Balance  Balance Balance Assessed: No  End of Session PT - End of Session Equipment Utilized During Treatment: Gait belt Activity Tolerance: Patient limited by fatigue Patient left: in chair;with call bell/phone within reach;with family/visitor present Nurse Communication: Mobility status   GP     Megan [name], [place] [phone] 07/20/2013, 1:12 PM

## 2013-07-21 DIAGNOSIS — D649 Anemia, unspecified: Secondary | ICD-10-CM

## 2013-07-21 LAB — CBC
HCT: 25.5 % — ABNORMAL LOW (ref 36.0–46.0)
MCHC: 34.1 g/dL (ref 30.0–36.0)
Platelets: 307 10*3/uL (ref 150–400)
RDW: 13.4 % (ref 11.5–15.5)
WBC: 6.5 10*3/uL (ref 4.0–10.5)

## 2013-07-21 LAB — RAPID URINE DRUG SCREEN, HOSP PERFORMED
Amphetamines: NOT DETECTED
Barbiturates: NOT DETECTED
Benzodiazepines: NOT DETECTED
Tetrahydrocannabinol: NOT DETECTED

## 2013-07-21 LAB — BASIC METABOLIC PANEL
BUN: 11 mg/dL (ref 6–23)
Chloride: 100 mEq/L (ref 96–112)
GFR calc Af Amer: 86 mL/min — ABNORMAL LOW (ref >90)
GFR calc non Af Amer: 74 mL/min — ABNORMAL LOW (ref >90)
Glucose, Bld: 168 mg/dL — ABNORMAL HIGH (ref 70–99)
Potassium: 3.8 mEq/L (ref 3.5–5.1)
Sodium: 137 mEq/L (ref 135–145)

## 2013-07-21 LAB — GLUCOSE, CAPILLARY
Glucose-Capillary: 133 mg/dL — ABNORMAL HIGH (ref 70–99)
Glucose-Capillary: 164 mg/dL — ABNORMAL HIGH (ref 70–99)

## 2013-07-21 LAB — IRON AND TIBC
Iron: 27 ug/dL — ABNORMAL LOW (ref 42–135)
UIBC: 223 ug/dL (ref 125–400)

## 2013-07-21 LAB — VITAMIN B12: Vitamin B-12: 257 pg/mL (ref 211–911)

## 2013-07-21 NOTE — Progress Notes (Signed)
Subjective: Episode of Vomit x2 overnight This am- awake more family  In room ? Difficult to swallow   Objective: Vital signs in last 24 hours: Temp:  [97.3 F (36.3 C)-99.5 F (37.5 C)] 98.7 F (37.1 C) (11/29 0554) Pulse Rate:  [74-84] 83 (11/29 0554) Resp:  [18] 18 (11/29 0554) BP: (136-154)/(44-73) 154/61 mmHg (11/29 0554) SpO2:  [96 %-100 %] 97 % (11/29 0554) Weight change:  Last BM Date: 07/21/13  Intake/Output from previous day: 11/28 0701 - 11/29 0700 In: 660 [P.O.:660] Out: -  Intake/Output this shift:    General appearance: alert Resp: clear to auscultation bilaterally GI: soft, non-tender; bowel sounds normal; no masses,  no organomegaly Neurologic: Grossly normal  Lab Results:  Recent Labs  07/21/13 0645  WBC 6.5  HGB 8.7*  HCT 25.5*  PLT 307   BMET  Recent Labs  07/18/13 0935 07/21/13 0645  NA 136 137  K 3.8 3.8  CL 101 100  CO2 27 27  GLUCOSE 234* 168*  BUN 11 11  CREATININE 0.90 0.71  CALCIUM 9.4 9.3    Studies/Results: No results found.  Medications: I have reviewed the patient's current medications.  Assessment/Plan: TIA- on aggronox- MRI negative/ continue PT/OT Shingle on left facial- reo solving- off Valtrex / GI intol  Vomiting episode Etiology unclear- - lab showed Anemia. : GERD swollow eval today Repeat CBC Anemia- check Iron and B12; stool guaiac DM is ok    LOS: 8 days   Orian Figueira 07/21/2013, 9:04 AM

## 2013-07-21 NOTE — Progress Notes (Signed)
Pt had one random episode of emesis at 0400. Vomited peaches which pt had at dinner previous night. Episode hit pt very suddenly. Cleaned pt, change gown, pt had no c/o nausea at this time. Prior to episode around 0200 pt was assisted to [facility], voided and had moderate formed to soft dark brown stool. Pt was resting in bed asleep when episode occurred. Zofran given and pt stated she felt much better. At beginning of shift pt was very drowsy but arousable. At this time pt is much more alert, smiling, and more talkative. Pt appeared to have no difficulty swallowing, though family states she has. Pt in very good spirits at this time. Pt left in sitting position in bed after taking Zofran. No further complaints. Will continue to monitor.

## 2013-07-22 LAB — CBC
HCT: 25 % — ABNORMAL LOW (ref 36.0–46.0)
MCH: 31.5 pg (ref 26.0–34.0)
MCHC: 34 g/dL (ref 30.0–36.0)
Platelets: 322 10*3/uL (ref 150–400)
RBC: 2.7 MIL/uL — ABNORMAL LOW (ref 3.87–5.11)
WBC: 6.7 10*3/uL (ref 4.0–10.5)

## 2013-07-22 LAB — OCCULT BLOOD X 1 CARD TO LAB, STOOL: Fecal Occult Bld: NEGATIVE

## 2013-07-22 LAB — GLUCOSE, CAPILLARY
Glucose-Capillary: 112 mg/dL — ABNORMAL HIGH (ref 70–99)
Glucose-Capillary: 120 mg/dL — ABNORMAL HIGH (ref 70–99)

## 2013-07-22 MED ORDER — POLYSACCHARIDE IRON COMPLEX 150 MG PO CAPS
150.0000 mg | ORAL_CAPSULE | Freq: Every day | ORAL | Status: DC
  Administered 2013-07-22: 150 mg via ORAL
  Filled 2013-07-22: qty 1

## 2013-07-22 MED ORDER — POLYSACCHARIDE IRON COMPLEX 150 MG PO CAPS: 150.0000 mg | ORAL_CAPSULE | Freq: Every day | ORAL | Status: DC

## 2013-07-22 NOTE — Progress Notes (Signed)
Pt discharged home in good in stable condition, with daughter Ahava Kissoon and Roney Marion at the bedside at the time of D/C. Education provided and all questions answered. To F/U with PCP in 2 weeks. Made aware to call office to schedule. Verbalized understanding.

## 2013-07-22 NOTE — Progress Notes (Signed)
Subjective: Pt awake - eating Breakfast  Objective: Vital signs in last 24 hours: Temp:  [98.1 F (36.7 C)-99.6 F (37.6 C)] 99.6 F (37.6 C) (11/30 0524) Pulse Rate:  [70-81] 73 (11/30 0524) Resp:  [18-20] 20 (11/30 0524) BP: (138-161)/(47-87) 138/57 mmHg (11/30 0524) SpO2:  [97 %-100 %] 97 % (11/30 0524) Weight change:  Last BM Date: 07/21/13  Intake/Output from previous day:   Intake/Output this shift: Total I/O In: 120 [P.O.:120] Out: -   General appearance: alert Resp: clear to auscultation bilaterally GI: soft, non-tender; bowel sounds normal; no masses,  no organomegaly  Lab Results:  Recent Labs  07/21/13 0645 07/22/13 0611  WBC 6.5 6.7  HGB 8.7* 8.5*  HCT 25.5* 25.0*  PLT 307 322   BMET  Recent Labs  07/21/13 0645  NA 137  K 3.8  CL 100  CO2 27  GLUCOSE 168*  BUN 11  CREATININE 0.71  CALCIUM 9.3    Studies/Results: No results found.  Medications: I have reviewed the patient's current medications.  Assessment/Plan: TIA- on aggronox- MRI negative/ continue PT/OT  Shingle on left facial- reo solving- off Valtrex / GI intol  Vomiting episode resolved- no more vomiting  Anemia. :  Low iron- B12 ok- Gauic stool negative- start iron- recheck CBC in 2 week GERD - PPI DM is ok Ok for D/c home  Family take care of her F/u in 2 week D/c summay done- earliar      LOS: 9 days   Megan [name] 07/22/2013, 9:27 AM

## 2013-07-23 LAB — GLUCOSE, CAPILLARY: Glucose-Capillary: 188 mg/dL — ABNORMAL HIGH (ref 70–99)

## 2013-08-04 ENCOUNTER — Other Ambulatory Visit: Payer: Self-pay

## 2013-08-04 ENCOUNTER — Encounter (HOSPITAL_COMMUNITY): Payer: Self-pay | Admitting: Emergency Medicine

## 2013-08-04 ENCOUNTER — Emergency Department (HOSPITAL_COMMUNITY): Payer: Medicare Other

## 2013-08-04 ENCOUNTER — Emergency Department (HOSPITAL_COMMUNITY)
Admission: EM | Admit: 2013-08-04 | Discharge: 2013-08-04 | Disposition: A | Discharge status: Home / Self Care | Payer: Medicare Other | Attending: Emergency Medicine | Admitting: Emergency Medicine

## 2013-08-04 DIAGNOSIS — R5381 Other malaise: Secondary | ICD-10-CM | POA: Insufficient documentation

## 2013-08-04 DIAGNOSIS — Z79899 Other long term (current) drug therapy: Secondary | ICD-10-CM | POA: Insufficient documentation

## 2013-08-04 DIAGNOSIS — E785 Hyperlipidemia, unspecified: Secondary | ICD-10-CM | POA: Insufficient documentation

## 2013-08-04 DIAGNOSIS — E119 Type 2 diabetes mellitus without complications: Secondary | ICD-10-CM | POA: Insufficient documentation

## 2013-08-04 DIAGNOSIS — R609 Edema, unspecified: Secondary | ICD-10-CM | POA: Insufficient documentation

## 2013-08-04 DIAGNOSIS — B029 Zoster without complications: Secondary | ICD-10-CM | POA: Insufficient documentation

## 2013-08-04 DIAGNOSIS — Z8673 Personal history of transient ischemic attack (TIA), and cerebral infarction without residual deficits: Secondary | ICD-10-CM | POA: Insufficient documentation

## 2013-08-04 DIAGNOSIS — F0390 Unspecified dementia without behavioral disturbance: Secondary | ICD-10-CM | POA: Insufficient documentation

## 2013-08-04 DIAGNOSIS — R531 Weakness: Secondary | ICD-10-CM

## 2013-08-04 DIAGNOSIS — Z8669 Personal history of other diseases of the nervous system and sense organs: Secondary | ICD-10-CM | POA: Insufficient documentation

## 2013-08-04 LAB — CBC WITH DIFFERENTIAL/PLATELET
Basophils Absolute: 0 10*3/uL (ref 0.0–0.1)
Basophils Relative: 0 % (ref 0–1)
Eosinophils Absolute: 0.1 10*3/uL (ref 0.0–0.7)
Eosinophils Relative: 1 % (ref 0–5)
HCT: 28.2 % — ABNORMAL LOW (ref 36.0–46.0)
Hemoglobin: 9.6 g/dL — ABNORMAL LOW (ref 12.0–15.0)
Lymphocytes Relative: 27 % (ref 12–46)
Lymphs Abs: 1.9 K/uL (ref 0.7–4.0)
MCH: 31.9 pg (ref 26.0–34.0)
MCHC: 34 g/dL (ref 30.0–36.0)
MCV: 93.7 fL (ref 78.0–100.0)
Monocytes Absolute: 0.6 K/uL (ref 0.1–1.0)
Monocytes Relative: 9 % (ref 3–12)
Neutro Abs: 4.5 K/uL (ref 1.7–7.7)
Neutrophils Relative %: 63 % (ref 43–77)
Platelets: 317 K/uL (ref 150–400)
RBC: 3.01 MIL/uL — ABNORMAL LOW (ref 3.87–5.11)
RDW: 13.3 % (ref 11.5–15.5)
WBC: 7.1 K/uL (ref 4.0–10.5)

## 2013-08-04 LAB — BASIC METABOLIC PANEL WITH GFR
BUN: 13 mg/dL (ref 6–23)
CO2: 24 meq/L (ref 19–32)
Chloride: 98 meq/L (ref 96–112)
Glucose, Bld: 134 mg/dL — ABNORMAL HIGH (ref 70–99)
Potassium: 5 meq/L (ref 3.5–5.1)

## 2013-08-04 LAB — URINALYSIS, ROUTINE W REFLEX MICROSCOPIC
Bilirubin Urine: NEGATIVE
Glucose, UA: NEGATIVE mg/dL
Hgb urine dipstick: NEGATIVE
Ketones, ur: NEGATIVE mg/dL
Leukocytes, UA: NEGATIVE
Nitrite: NEGATIVE
Protein, ur: NEGATIVE mg/dL
Specific Gravity, Urine: 1.009 (ref 1.005–1.030)
Urobilinogen, UA: 0.2 mg/dL (ref 0.0–1.0)
pH: 5.5 (ref 5.0–8.0)

## 2013-08-04 LAB — GLUCOSE, CAPILLARY: Glucose-Capillary: 131 mg/dL — ABNORMAL HIGH (ref 70–99)

## 2013-08-04 LAB — TROPONIN I: Troponin I: <0.3 ng/mL (ref <0.30)

## 2013-08-04 LAB — BASIC METABOLIC PANEL
Calcium: 9.9 mg/dL (ref 8.4–10.5)
Creatinine, Ser: 0.94 mg/dL (ref 0.50–1.10)
GFR calc Af Amer: 61 mL/min — ABNORMAL LOW (ref >90)
GFR calc non Af Amer: 52 mL/min — ABNORMAL LOW (ref >90)
Sodium: 134 mEq/L — ABNORMAL LOW (ref 135–145)

## 2013-08-04 MED ORDER — SODIUM CHLORIDE 0.9 % IV BOLUS (SEPSIS)
500.0000 mL | Freq: Once | INTRAVENOUS | Status: AC
  Administered 2013-08-04: 500 mL via INTRAVENOUS

## 2013-08-04 NOTE — ED Notes (Signed)
Checked patient blood sugar it was 131 notfied RN Autry of blood sugar

## 2013-08-04 NOTE — ED Provider Notes (Signed)
CSN: [id number]     Arrival date & time 08/04/13  1011 History   First MD Initiated Contact with Patient 08/04/13 1017     Chief Complaint  Patient presents with  . Altered Mental Status   (Consider location/radiation/quality/duration/timing/severity/associated sxs/prior Treatment) HPI Comments: Pt has been sleeping more, seems more tired, will have episodes of simply falling asleep while talking.  Seems less energetic.  Pt seen by Dr. Valentina Lucks last week, had blood tests done, nothing specific except has follow up scheduled for Monday to recheck Hgb as it was around 9 per family.  No complaints of pain, coughing, has had intermittent N/V but none in the past 3 days, has eaten sparingly since then.  No diarrhea, fevers.    Patient is a 79 y.o. female presenting with weakness. The history is provided by the patient, medical records and a relative.  Weakness This is a new problem. The problem has been gradually worsening. Pertinent negatives include no chest pain, no abdominal pain, no headaches and no shortness of breath.    Past Medical History  Diagnosis Date  . Stroke   . Diabetes mellitus   . Hyperlipidemia   . Seizure    History reviewed. No pertinent past surgical history. Family History  Problem Relation Age of Onset  . CAD Mother   . Hypertension Mother   . Diabetes Mother    History  Substance Use Topics  . Smoking status: Never Smoker   . Smokeless tobacco: Former Neurosurgeon    Types: Snuff  . Alcohol Use: No   OB History   Grav Para Term Preterm Abortions TAB SAB Ect Mult Living                 Review of Systems  Unable to perform ROS: Dementia  Respiratory: Negative for shortness of breath.   Cardiovascular: Negative for chest pain.  Gastrointestinal: Negative for abdominal pain.  Neurological: Positive for weakness. Negative for headaches.    Allergies  Review of patient's allergies indicates no known allergies.  Home Medications   Current Outpatient Rx   Name  Route  Sig  Dispense  Refill  . dipyridamole-aspirin (AGGRENOX) 25-200 MG per 12 hr capsule   Oral   Take 1 capsule by mouth 2 (two) times daily.         . metFORMIN (GLUCOPHAGE) 500 MG tablet   Oral   Take 1,000 mg by mouth 2 (two) times daily with a meal.         . pantoprazole (PROTONIX) 40 MG tablet   Oral   Take 40 mg by mouth every evening.         . rosuvastatin (CRESTOR) 5 MG tablet   Oral   Take 5 mg by mouth every Monday, Wednesday, and Friday.         . sodium chloride (BRONCHO SALINE) inhaler solution   Nebulization   Take 2 sprays by nebulization as needed.          BP 132/56  Pulse 69  Temp(Src) 97.5 F (36.4 C) (Oral)  Resp 17  SpO2 100% Physical Exam  Nursing note and vitals reviewed. Constitutional: She appears well-developed and well-nourished. No distress.  HENT:  Head: Normocephalic and atraumatic.  Mouth/Throat: No oropharyngeal exudate.  Eyes: Conjunctivae and EOM are normal. No scleral icterus.  Neck: Normal range of motion. Neck supple. No JVD present.  Cardiovascular: Normal rate and regular rhythm.   Pulmonary/Chest: Effort normal. She has no wheezes. She has no rales.  Abdominal: Soft. She exhibits no distension. There is no tenderness.  Musculoskeletal: She exhibits edema.  Right leg trace edema > left leg, family reports is baseline since her stroke made right side weaker  Neurological: She is alert. She exhibits normal muscle tone.  4/5 strength RUE, RLE  Skin: Skin is warm. Rash noted. She is not diaphoretic.  healing shingles rash on left forehead    ED Course  Procedures (including critical care time) Labs Review Labs Reviewed  GLUCOSE, CAPILLARY - Abnormal; Notable for the following:    Glucose-Capillary 131 (*)    All other components within normal limits  CBC WITH DIFFERENTIAL - Abnormal; Notable for the following:    RBC 3.01 (*)    Hemoglobin 9.6 (*)    HCT 28.2 (*)    All other components within normal  limits  BASIC METABOLIC PANEL - Abnormal; Notable for the following:    Sodium 134 (*)    Glucose, Bld 134 (*)    GFR calc non Af Amer 52 (*)    GFR calc Af Amer 61 (*)    All other components within normal limits  URINE CULTURE  URINALYSIS, ROUTINE W REFLEX MICROSCOPIC  TROPONIN I   Imaging Review Ct Head Wo Contrast  08/04/2013   CLINICAL DATA:  Altered mental status.  EXAM: CT HEAD WITHOUT CONTRAST  TECHNIQUE: Contiguous axial images were obtained from the base of the skull through the vertex without intravenous contrast.  COMPARISON:  07/13/2013  FINDINGS: There is diffuse cortical and cerebellar atrophy. Diffuse areas of low attenuation project within the subcortical, deep, and periventricular white matter regions. No evidence of mass effect. There is no evidence of intra-axial nor extra-axial fluid collections nor evidence of acute hemorrhage.  There is no evidence for depressed skull fracture. The visualized paranasal sinuses and mastoid air cells are patent.  IMPRESSION: Chronic an involutional changes without evidence of focal acute abnormalities.   Electronically Signed   By: Salome Holmes M.D.   On: 08/04/2013 12:14   Dg Chest Port 1 View  08/04/2013   CLINICAL DATA:  Chest pain  EXAM: PORTABLE CHEST - 1 VIEW  COMPARISON:  07/13/2013  FINDINGS: Mild low lung volumes with minor basilar atelectasis and vascular crowding. No CHF or pneumonia. Negative for effusion or pneumothorax. Normal heart size and vascularity. Atherosclerosis of the aorta. Degenerative changes of the spine and shoulders.  IMPRESSION: Stable low volume exam.  No acute process   Electronically Signed   By: Ruel Favors M.D.   On: 08/04/2013 11:16    EKG Interpretation    Date/Time:  Saturday August 04 2013 10:25:14 EST Ventricular Rate:  83 PR Interval:  201 QRS Duration: 73 QT Interval:  348 QTC Calculation: 409 R Axis:   48 Text Interpretation:  Sinus rhythm Low voltage, extremity and precordial leads  Baseline wander in lead(s) II III aVF V2 , aVL Borderline ECG Confirmed by [facility]  MD, MICHEAL ([id number]) on 08/04/2013 12:35:46 PM           ra sat is 100% and I interpret to be normal  1:50 PM Pt has remained awake, alert, conversant with myslef, staff and family throughout.  Subtle lab derangemetns appear to be stable and chronic.  Head CT neg, UA is neg for infection.  Family reassured, pt is stable to follow up with Dr. Valentina Lucks on Monday.   MDM   1. Weakness      Pt is awake, conversant at baseline at the moment.  No new weakness noted, pt is alert, no dentures in so slight facial assymetry and slurring of words is expected and verified by family.  Will get head CT, CXR, check electrolytes and UA, and give IVF bolus.  Otherwise pt is stable to be followed up by Dr. Valentina Lucks on Monday.  Hgb is 9.6, higher than prior values.      Gavin Pound. Anquan Azzarello, MD 08/04/13 1351

## 2013-08-04 NOTE — ED Notes (Addendum)
Pt presents to department via [facility] from home for evaluation of altered mental status and increasing generalized weakness. Family reports patient has progressively become more weak over the past several weeks. Pt confused and lethargic upon arrival to ED. Able to move all extremities. R sided residual from previous CVA. Denies pain. CBG 41.

## 2013-08-04 NOTE — Discharge Instructions (Signed)
Weakness  Weakness is a lack of strength. It may be felt all over the body (generalized) or in one specific part of the body (focal). Some causes of weakness can be serious. You may need further medical evaluation, especially if you are elderly or you have a history of immunosuppression (such as chemotherapy or HIV), kidney disease, heart disease, or diabetes.  CAUSES   Weakness can be caused by many different things, including:  · Infection.  · Physical exhaustion.  · Internal bleeding or other blood loss that results in a lack of red blood cells (anemia).  · Dehydration. This cause is more common in elderly people.  · Side effects or electrolyte abnormalities from medicines, such as pain medicines or sedatives.  · Emotional distress, anxiety, or depression.  · Circulation problems, especially severe peripheral arterial disease.  · Heart disease, such as rapid atrial fibrillation, bradycardia, or heart failure.  · Nervous system disorders, such as Guillain-Barré syndrome, multiple sclerosis, or stroke.  DIAGNOSIS   To find the cause of your weakness, your caregiver will take your history and perform a physical exam. Lab tests or X-rays may also be ordered, if needed.  TREATMENT   Treatment of weakness depends on the cause of your symptoms and can vary greatly.  HOME CARE INSTRUCTIONS   · Rest as needed.  · Eat a well-balanced diet.  · Try to get some exercise every day.  · Only take over-the-counter or prescription medicines as directed by your caregiver.  SEEK MEDICAL CARE IF:   · Your weakness seems to be getting worse or spreads to other parts of your body.  · You develop new aches or pains.  SEEK IMMEDIATE MEDICAL CARE IF:   · You cannot perform your normal daily activities, such as getting dressed and feeding yourself.  · You cannot walk up and down stairs, or you feel exhausted when you do so.  · You have shortness of breath or chest pain.  · You have difficulty moving parts of your body.  · You have weakness  in only one area of the body or on only one side of the body.  · You have a fever.  · You have trouble speaking or swallowing.  · You cannot control your bladder or bowel movements.  · You have black or bloody vomit or stools.  MAKE SURE YOU:  · Understand these instructions.  · Will watch your condition.  · Will get help right away if you are not doing well or get worse.  Document Released: 08/09/2005 Document Revised: 02/08/2012 Document Reviewed: 10/08/2011  [employer]® Patient Information ©2014 [employer].

## 2013-08-05 LAB — URINE CULTURE
Colony Count: NO GROWTH
Culture: NO GROWTH

## 2013-08-07 ENCOUNTER — Ambulatory Visit (INDEPENDENT_AMBULATORY_CARE_PROVIDER_SITE_OTHER): Payer: Medicare Other | Admitting: Neurology

## 2013-08-07 ENCOUNTER — Encounter: Payer: Self-pay | Admitting: Neurology

## 2013-08-07 ENCOUNTER — Other Ambulatory Visit (INDEPENDENT_AMBULATORY_CARE_PROVIDER_SITE_OTHER): Payer: Medicare Other | Admitting: Radiology

## 2013-08-07 ENCOUNTER — Encounter (INDEPENDENT_AMBULATORY_CARE_PROVIDER_SITE_OTHER): Payer: Self-pay

## 2013-08-07 VITALS — BP 144/82 | HR 102

## 2013-08-07 DIAGNOSIS — F0280 Dementia in other diseases classified elsewhere without behavioral disturbance: Secondary | ICD-10-CM | POA: Insufficient documentation

## 2013-08-07 DIAGNOSIS — R400 Somnolence: Secondary | ICD-10-CM | POA: Insufficient documentation

## 2013-08-07 DIAGNOSIS — F0150 Vascular dementia without behavioral disturbance: Secondary | ICD-10-CM | POA: Insufficient documentation

## 2013-08-07 DIAGNOSIS — R404 Transient alteration of awareness: Secondary | ICD-10-CM

## 2013-08-07 MED ORDER — DONEPEZIL HCL 5 MG PO TABS: 5.0000 mg | ORAL_TABLET | Freq: Every day | ORAL | Status: DC

## 2013-08-07 NOTE — Progress Notes (Signed)
[facility] [address] [place] [place] [address] [phone]       OFFICE FOLLOW-UP NOTE  [name] Date of Birth:  11-Jul-1922 Medical Record Number:  [id number]   HPI: 78 year African American lady with prior pontine infarct in 2012 with spastic residual right hemiparesis with recurrent episodes of transient right-sided weakness as well as recent episodes of sleepiness and transient worsening of symptoms with multiple visits to the hospital with negative imaging studies as well as lab evaluation. She is seen today for followup after last visit with me on 09/13/12. 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 She apparently was doing fine until 3 weeks ago when she was found to have episode of increasing right-sided weakness in: Speech and sleepiness. She was taken to [facility] and admitted for 8 days and had negative brain imaging studies including CT scan and MRI on 07/13/13 which I personally reviewed which did not reveal any acute stroke. CBC, UA and basic chemistry labs were also normal. Suicidal with shingles affecting the left forearm and was treated with a cycle rate. The daughter feels that she has not been the same since then. She has periods when she is quite sleepy and at times difficult to arouse and other times when she is more awake. Right-sided weakness seems more pronounced when she sleepy. She has seen her primary physician twice and has undergone lab work and CT scan which was negative. She was seen in the ER recently on 08/05/39 and with again negative lab work and CT scan and was given IV fluids and improved. She has also not been ambulating as much she requires one-person assist to walk with a walker and walks quite little. Her blood sugars have been reasonably well controlled with fasting glucoses being in the 130s and postprandials in the 140s. She had lipid profile checked 5 weeks ago which was fine hemoglobin A1c was 6.5. Patient was tried on  Keppra for seizure trial but did not tolerate it due to drowsiness. The patient has also been found to have memory difficulties and has not been as interactive and cognitively has declined for the last several months. She has never been on a trial of Aricept in the past. She had vitamin B12 checked on 07/21/13 which was low-normal at 257 and TSH was normal on 01/07/2012 ROS:   14 system review of systems is positive for fatigue, hearing loss, memory loss, confusion, weakness, slurred speech, too much sleep, decreased energy, change in appetite PMH:  Past Medical History  Diagnosis Date  . Stroke   . Diabetes mellitus   . Hyperlipidemia   . Seizure     Social History:  History   Social History  . Marital Status: Widowed    Spouse Name: N/A    Number of Children: N/A  . Years of Education: N/A   Occupational History  . Not on file.   Social History Main Topics  . Smoking status: Never Smoker   . Smokeless tobacco: Former Neurosurgeon    Types: Snuff  . Alcohol Use: No  . Drug Use: No  . Sexual Activity: Not on file   Other Topics Concern  . Not on file   Social History Narrative  . No narrative on file    Medications:   Current Outpatient Prescriptions on File Prior to Visit  Medication Sig Dispense Refill  . dipyridamole-aspirin (AGGRENOX) 25-200 MG per 12 hr capsule Take 1 capsule by mouth 2 (two) times  daily.      . metFORMIN (GLUCOPHAGE) 500 MG tablet Take 1,000 mg by mouth 2 (two) times daily with a meal.      . pantoprazole (PROTONIX) 40 MG tablet Take 40 mg by mouth every evening.      . rosuvastatin (CRESTOR) 5 MG tablet Take 5 mg by mouth every Monday, Wednesday, and Friday.      . sodium chloride (BRONCHO SALINE) inhaler solution Take 2 sprays by nebulization as needed.       No current facility-administered medications on file prior to visit.    Allergies:  No Known Allergies  Physical Exam General: Frail elderly African American lady, seated, in no evident  distress Head: head normocephalic and atraumatic. Orohparynx benign Neck: supple with no carotid or supraclavicular bruits Cardiovascular: regular rate and rhythm, no murmurs Musculoskeletal: no deformity. Mild kyphoscoliosis Skin:  no rash/petichiae Vascular:  Normal pulses all extremities Filed Vitals:   08/07/13 1414  BP: 144/82  Pulse: 102    Neurologic Exam Mental Status: Awake and fully alert. Disoriented to place and time. Recent and remote memory poor. Attention span, concentration and fund of knowledge diminished. Mood and affect appropriate. Pseudobulbar speech with dysarthria Cranial Nerves: Fundoscopic exam reveals sharp disc margins. Pupils equal, briskly reactive to light. Extraocular movements full without nystagmus. Visual fields full to confrontation. Hearing intact. Facial sensation intact but weakness of her right lower face.. Tongue, palate moves normally and symmetrically.  Motor: Spastic right hemiplegia with 2/5 right upper extremity and 3/5 right lower extremity strength with distal greater than proximal weakness. Normal strength on the left side. Increased tone on the right with spasticity Sensory.: Diminished touch and pinprick and vibratory sensation on the right.  Coordination: Rapid alternating movements normal in all extremities. Finger-to-nose and heel-to-shin performed accurately bilaterally. Gait and Station: Not tested as patient is in wheelchair and walks with a walker with one person assist and did not bring her walker Reflexes: 1+ and asymmetric brisker on right. Toes downgoing.       ASSESSMENT: 82 year African American lady with remote history of left pontine stroke in 2012 due to small disease with resultant right hemiparesis with several episodes of TIAs and possible partial complex seizures with recent  episodes of transient sleepiness of unclear etiology with negative lab and imaging evaluation . 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 Check EEG and trial of Aricept for dementia. Return for followup in 2 months with Heide Guile, NP  or call earlier if necessary.

## 2013-08-07 NOTE — Patient Instructions (Signed)
Continue Aggrenox for stroke prevention and strict control of diabetes with hemoglobin A1c goal below 6.5% and lipids with LDL cholesterol goal below 70 mg percent. Check EEG and trial of Aricept for dementia. Return for followup in 2 months with Heide Guile, NP  or call earlier if necessary.

## 2013-08-08 ENCOUNTER — Other Ambulatory Visit: Payer: Self-pay | Admitting: Neurology

## 2013-08-08 ENCOUNTER — Encounter (HOSPITAL_COMMUNITY): Payer: Self-pay | Admitting: Emergency Medicine

## 2013-08-08 ENCOUNTER — Telehealth: Payer: Self-pay | Admitting: Neurology

## 2013-08-08 ENCOUNTER — Emergency Department (HOSPITAL_COMMUNITY): Payer: Medicare Other

## 2013-08-08 ENCOUNTER — Inpatient Hospital Stay (HOSPITAL_COMMUNITY)
Admission: EM | Admit: 2013-08-08 | Discharge: 2013-08-15 | DRG: 064 | Disposition: A | Discharge status: Home Health | Payer: Medicare Other | Attending: Internal Medicine | Admitting: Internal Medicine

## 2013-08-08 DIAGNOSIS — E43 Unspecified severe protein-calorie malnutrition: Secondary | ICD-10-CM | POA: Diagnosis present

## 2013-08-08 DIAGNOSIS — I69959 Hemiplegia and hemiparesis following unspecified cerebrovascular disease affecting unspecified side: Secondary | ICD-10-CM

## 2013-08-08 DIAGNOSIS — I10 Essential (primary) hypertension: Secondary | ICD-10-CM | POA: Diagnosis present

## 2013-08-08 DIAGNOSIS — G40219 Localization-related (focal) (partial) symptomatic epilepsy and epileptic syndromes with complex partial seizures, intractable, without status epilepticus: Secondary | ICD-10-CM

## 2013-08-08 DIAGNOSIS — R4182 Altered mental status, unspecified: Secondary | ICD-10-CM | POA: Diagnosis present

## 2013-08-08 DIAGNOSIS — I639 Cerebral infarction, unspecified: Secondary | ICD-10-CM

## 2013-08-08 DIAGNOSIS — Z8249 Family history of ischemic heart disease and other diseases of the circulatory system: Secondary | ICD-10-CM

## 2013-08-08 DIAGNOSIS — E785 Hyperlipidemia, unspecified: Secondary | ICD-10-CM | POA: Diagnosis present

## 2013-08-08 DIAGNOSIS — E119 Type 2 diabetes mellitus without complications: Secondary | ICD-10-CM | POA: Diagnosis present

## 2013-08-08 DIAGNOSIS — R569 Unspecified convulsions: Secondary | ICD-10-CM

## 2013-08-08 DIAGNOSIS — I6350 Cerebral infarction due to unspecified occlusion or stenosis of unspecified cerebral artery: Principal | ICD-10-CM | POA: Diagnosis present

## 2013-08-08 DIAGNOSIS — E1165 Type 2 diabetes mellitus with hyperglycemia: Secondary | ICD-10-CM | POA: Diagnosis present

## 2013-08-08 DIAGNOSIS — R531 Weakness: Secondary | ICD-10-CM

## 2013-08-08 DIAGNOSIS — F0280 Dementia in other diseases classified elsewhere without behavioral disturbance: Secondary | ICD-10-CM

## 2013-08-08 DIAGNOSIS — R1312 Dysphagia, oropharyngeal phase: Secondary | ICD-10-CM | POA: Diagnosis present

## 2013-08-08 DIAGNOSIS — Z833 Family history of diabetes mellitus: Secondary | ICD-10-CM

## 2013-08-08 DIAGNOSIS — G9340 Encephalopathy, unspecified: Secondary | ICD-10-CM | POA: Diagnosis present

## 2013-08-08 DIAGNOSIS — Z823 Family history of stroke: Secondary | ICD-10-CM

## 2013-08-08 LAB — COMPREHENSIVE METABOLIC PANEL
ALT: 7 U/L (ref 0–35)
AST: 18 U/L (ref 0–37)
Albumin: 3.5 g/dL (ref 3.5–5.2)
CO2: 21 mEq/L (ref 19–32)
Calcium: 9.8 mg/dL (ref 8.4–10.5)
GFR calc non Af Amer: 54 mL/min — ABNORMAL LOW (ref >90)
Sodium: 130 mEq/L — ABNORMAL LOW (ref 135–145)
Total Protein: 7.6 g/dL (ref 6.0–8.3)

## 2013-08-08 LAB — POCT I-STAT, CHEM 8
BUN: 15 mg/dL (ref 6–23)
Calcium, Ion: 1.3 mmol/L (ref 1.13–1.30)
Chloride: 99 mEq/L (ref 96–112)
Creatinine, Ser: 1.1 mg/dL (ref 0.50–1.10)
HCT: 33 % — ABNORMAL LOW (ref 36.0–46.0)
Hemoglobin: 11.2 g/dL — ABNORMAL LOW (ref 12.0–15.0)
Potassium: 4.8 mEq/L (ref 3.5–5.1)
Sodium: 134 mEq/L — ABNORMAL LOW (ref 135–145)

## 2013-08-08 LAB — APTT: aPTT: 28 seconds (ref 24–37)

## 2013-08-08 LAB — DIFFERENTIAL
Basophils Absolute: 0 10*3/uL (ref 0.0–0.1)
Basophils Relative: 0 % (ref 0–1)
Eosinophils Relative: 2 % (ref 0–5)
Lymphocytes Relative: 39 % (ref 12–46)
Monocytes Absolute: 0.5 10*3/uL (ref 0.1–1.0)
Neutrophils Relative %: 50 % (ref 43–77)

## 2013-08-08 LAB — CBC
HCT: 30.6 % — ABNORMAL LOW (ref 36.0–46.0)
MCV: 94.2 fL (ref 78.0–100.0)
Platelets: 302 10*3/uL (ref 150–400)
RBC: 3.25 MIL/uL — ABNORMAL LOW (ref 3.87–5.11)
RDW: 13 % (ref 11.5–15.5)
WBC: 6.3 10*3/uL (ref 4.0–10.5)

## 2013-08-08 LAB — PROTIME-INR
INR: 1 (ref 0.00–1.49)
Prothrombin Time: 13 seconds (ref 11.6–15.2)

## 2013-08-08 LAB — TROPONIN I: Troponin I: <0.3 ng/mL (ref <0.30)

## 2013-08-08 MED ORDER — DIVALPROEX SODIUM ER 500 MG PO TB24: 500.0000 mg | ORAL_TABLET | Freq: Every day | ORAL | Status: DC

## 2013-08-08 MED ORDER — ONDANSETRON HCL 4 MG/2ML IJ SOLN
4.0000 mg | Freq: Once | INTRAMUSCULAR | Status: AC
  Administered 2013-08-08: 4 mg via INTRAVENOUS
  Filled 2013-08-08: qty 2

## 2013-08-08 NOTE — ED Provider Notes (Signed)
CSN: [id number]     Arrival date & time 08/08/13  2224 History   First MD Initiated Contact with Patient 08/08/13 2230     Chief Complaint  Patient presents with  . Code Stroke   (Consider location/radiation/quality/duration/timing/severity/associated sxs/prior Treatment) HPI  This is an 79 year old female with a history of CVA, diabetes, hyperlipidemia, and seizures who presents as a code stroke. Last seen normal at 8:30 PM. Family reports left-sided weakness, facial droop. He also reports slurred speech. Patient had a stroke in June of 2012 and February 2014 with right-sided deficits. She has a history of hemorrhagic stroke.  Patient saw her neurologist yesterday and felt to be more sedated and not at her baseline. Concern for subclinical seizures.  Per the patient's daughter, she has not been her usual self over the last several days. She denies any fevers. She does state that the patient has not been her normal self since her last admission for a similar presentation.  Patient is noncontributory to history taking.  Level V caveat secondary to patient mental status. Past Medical History  Diagnosis Date  . Stroke   . Diabetes mellitus   . Hyperlipidemia   . Seizure    History reviewed. No pertinent past surgical history. Family History  Problem Relation Age of Onset  . CAD Mother   . Hypertension Mother   . Diabetes Mother   . Stroke Mother    History  Substance Use Topics  . Smoking status: Never Smoker   . Smokeless tobacco: Former Neurosurgeon    Types: Snuff  . Alcohol Use: No   OB History   Grav Para Term Preterm Abortions TAB SAB Ect Mult Living                 Review of Systems  Unable to perform ROS: Mental status change    Allergies  Review of patient's allergies indicates no known allergies.  Home Medications   No current outpatient prescriptions on file. BP 143/75  Pulse 87  Temp(Src) 97.3 F (36.3 C) (Rectal)  Resp 23  Ht 5\' 1"  (1.549 m)  Wt 118 lb  (53.524 kg)  BMI 22.31 kg/m2  SpO2 100% Physical Exam  Nursing note and vitals reviewed. Constitutional:  Elderly, chronically ill-appearing  HENT:  Head: Normocephalic and atraumatic.  Left facial droop noted  Eyes: Pupils are equal, round, and reactive to light.  Neck: Neck supple.  Cardiovascular: Normal rate, regular rhythm and normal heart sounds.   Pulmonary/Chest: Effort normal and breath sounds normal. No respiratory distress.  Abdominal: Soft. There is no tenderness.  Musculoskeletal: She exhibits no edema.  Neurological:  Patient is nonverbal at this time, she does not follow commands, no spontaneous movement noted of the left upper lower extremities, left facial droop noted  Skin: Skin is warm and dry.  Psychiatric:  Dementia    ED Course  Procedures (including critical care time) Labs Review Labs Reviewed  CBC - Abnormal; Notable for the following:    RBC 3.25 (*)    Hemoglobin 10.1 (*)    HCT 30.6 (*)    All other components within normal limits  COMPREHENSIVE METABOLIC PANEL - Abnormal; Notable for the following:    Sodium 130 (*)    Chloride 95 (*)    Glucose, Bld 140 (*)    Total Bilirubin 0.2 (*)    GFR calc non Af Amer 54 (*)    GFR calc Af Amer 63 (*)    All other components within normal  limits  GLUCOSE, CAPILLARY - Abnormal; Notable for the following:    Glucose-Capillary 134 (*)    All other components within normal limits  POCT I-STAT, CHEM 8 - Abnormal; Notable for the following:    Sodium 134 (*)    Glucose, Bld 141 (*)    Hemoglobin 11.2 (*)    HCT 33.0 (*)    All other components within normal limits  PROTIME-INR  APTT  DIFFERENTIAL  TROPONIN I  POCT I-STAT TROPONIN I   Imaging Review Ct Head (brain) Wo Contrast  08/08/2013   CLINICAL DATA:  Code stroke. Right-sided deficit with gazing to the right.  EXAM: CT HEAD WITHOUT CONTRAST  TECHNIQUE: Contiguous axial images were obtained from the base of the skull through the vertex without  intravenous contrast.  COMPARISON:  08/22/2013  FINDINGS: Skull and Sinuses:No significant abnormality.  Orbits: Gaze deviation to the right.  Brain: No definite large territory infarct. Apparent gray-white differentiation loss in the anterior right frontal lobe appears unchanged from 08/04/2013. No evidence of acute hemorrhage. Global brain atrophy with patchy bilateral cerebral white matter disease, consistent with chronic small vessel ischemia.  These results were called by telephone at the time of interpretation on 08/08/2013 at 10:50 PM to Dr. Leroy Kennedy, who verbally acknowledged these results.  IMPRESSION: 1. Negative for acute hemorrhage. No change to suggest large vessel infarct. 2. Brain atrophy and extensive chronic small vessel ischemia.   Electronically Signed   By: Tiburcio Pea M.D.   On: 08/08/2013 22:51    EKG Interpretation    Date/Time:  Wednesday August 08 2013 22:46:55 EST Ventricular Rate:  87 PR Interval:  180 QRS Duration: 78 QT Interval:  349 QTC Calculation: 420 R Axis:   53 Text Interpretation:  Sinus rhythm has not changed Confirmed by Aleia Larocca  MD, Tamyka Bezio ([id number]) on 08/09/2013 12:14:13 AM            MDM   1. Left-sided weakness    Patient admitted for altered mental status and further neurologic workup.    Shon Baton, MD 08/09/13 [phone]

## 2013-08-08 NOTE — Consult Note (Addendum)
Referring Physician: ED    Chief Complaint: CODE STROKE: LEFT SIDED WEAKNESS, SLURRED SPECCH, DECREASED RESPONSIVENESS.  HPI:                                                                                                                                         Megan [name] is an 79 y.o. female with a past medical history significant for HTN, hyperlipidemia, DM, stroke x 2 with residual right hemiparesis, possible vascular dementia, seizures, brought to [facility] ED by ambulance as a code stroke due to the above stated symptoms. She was last known well at 830 pm when her daughter noted that she was less responsive,with left face droop, and unable to move the left side. Megan [name] has been ill in the past few days and her daughter expressed that she has been very sleepy, less responsive, and frail but with negative lab and imaging evaluation. She was seen by her vascular neurologist Dr. Pearlean Brownie yesterday and a concern declining cognition and for possible partial complex seizures was raised. According to patient's daughter, she got a call from Dr. Pearlean Brownie tonight recommending starting Depakote due to abnormal EEG findings. CT brain tonight showed no acute intracranial abnormality.  Date last known well: 08/08/13 Time last known well: 830PM tPA Given: no, rapidly improving. As per chart review, one of her prior strokes was ICH. Concern that current presentation could be a seizure.   Past Medical History  Diagnosis Date  . Stroke   . Diabetes mellitus   . Hyperlipidemia   . Seizure     History reviewed. No pertinent past surgical history.  Family History  Problem Relation Age of Onset  . CAD Mother   . Hypertension Mother   . Diabetes Mother   . Stroke Mother    Social History:  reports that she has never smoked. She has quit using smokeless tobacco. Her smokeless tobacco use included Snuff. She reports that she does not drink alcohol or use illicit drugs.  Allergies: No Known  Allergies  Medications:                                                                                                                           I have reviewed the patient's current medications.  ROS: unable to obtain due to mental status.  History obtained from chart review and daughter.  Physical exam: elderly female, poorly responsive, no apparent distress. Blood pressure 161/72, pulse 72, temperature 97.1 F (36.2 C), temperature source Rectal, height 5\' 1"  (1.549 m), weight 53.524 kg (118 lb), SpO2 98.00%. Head: normocephalic. Neck: supple, no bruits, no JVD. Cardiac: no murmurs. Lungs: clear. Abdomen: soft, no tender, no mass. Extremities: right greater than left LE edema.   Neurologic Examination:                                                                                                      Mental Status: Awake but not fully alert. Oriented to place. Knows she is in the hospital. Able to recognize her daughter.  Dysarthric but without evidence of aphasia.  Cranial Nerves: II: Discs flat bilaterally; Visual fields grossly normal, pupils equal, round, reactive to light and accommodation III,IV, VI: ptosis not present, seem to have a right gaze preference? V,VII: smile asymmetric with right face weakness, facial light touch sensation no tested VIII: hearing grossly intact bilaterally IX,X: gag reflex present XI: bilateral shoulder shrug no tested. XII: midline tongue extension without atrophy or fasciculations Motor: Significant for spastic right HP. She is moving the left side spontaneously Sensory: unreliable at this moment, but reacts to pain. Deep Tendon Reflexes:  Plantars: Right: downgoing   Left: downgoing Cerebellar: Unable to test at this moment. Gait:  Unable to test. CV: pulses palpable throughout    Results for orders  placed during the hospital encounter of 08/08/13 (from the past 48 hour(s))  PROTIME-INR     Status: None   Collection Time    08/08/13 10:28 PM      Result Value Range   Prothrombin Time 13.0  11.6 - 15.2 seconds   INR 1.00  0.00 - 1.49  APTT     Status: None   Collection Time    08/08/13 10:28 PM      Result Value Range   aPTT 28  24 - 37 seconds  CBC     Status: Abnormal   Collection Time    08/08/13 10:28 PM      Result Value Range   WBC 6.3  4.0 - 10.5 K/uL   RBC 3.25 (*) 3.87 - 5.11 MIL/uL   Hemoglobin 10.1 (*) 12.0 - 15.0 g/dL   HCT 09.8 (*) 11.9 - 14.7 %   MCV 94.2  78.0 - 100.0 fL   MCH 31.1  26.0 - 34.0 pg   MCHC 33.0  30.0 - 36.0 g/dL   RDW 82.9  56.2 - 13.0 %   Platelets 302  150 - 400 K/uL  DIFFERENTIAL     Status: None   Collection Time    08/08/13 10:28 PM      Result Value Range   Neutrophils Relative % 50  43 - 77 %   Neutro Abs 3.2  1.7 - 7.7 K/uL   Lymphocytes Relative 39  12 - 46 %   Lymphs Abs 2.5  0.7 - 4.0 K/uL   Monocytes Relative 8  3 - 12 %  Monocytes Absolute 0.5  0.1 - 1.0 K/uL   Eosinophils Relative 2  0 - 5 %   Eosinophils Absolute 0.2  0.0 - 0.7 K/uL   Basophils Relative 0  0 - 1 %   Basophils Absolute 0.0  0.0 - 0.1 K/uL  POCT I-STAT TROPONIN I     Status: None   Collection Time    08/08/13 10:36 PM      Result Value Range   Troponin i, poc 0.00  0.00 - 0.08 ng/mL   Comment 3            Comment: Due to the release kinetics of cTnI,     a negative result within the first hours     of the onset of symptoms does not rule out     myocardial infarction with certainty.     If myocardial infarction is still suspected,     repeat the test at appropriate intervals.  POCT I-STAT, CHEM 8     Status: Abnormal   Collection Time    08/08/13 10:39 PM      Result Value Range   Sodium 134 (*) 135 - 145 mEq/L   Potassium 4.8  3.5 - 5.1 mEq/L   Chloride 99  96 - 112 mEq/L   BUN 15  6 - 23 mg/dL   Creatinine, Ser 4.09  0.50 - 1.10 mg/dL    Glucose, Bld 811 (*) 70 - 99 mg/dL   Calcium, Ion 9.14  7.82 - 1.30 mmol/L   TCO2 23  0 - 100 mmol/L   Hemoglobin 11.2 (*) 12.0 - 15.0 g/dL   HCT 95.6 (*) 21.3 - 08.6 %   Ct Head (brain) Wo Contrast  08/08/2013   CLINICAL DATA:  Code stroke. Right-sided deficit with gazing to the right.  EXAM: CT HEAD WITHOUT CONTRAST  TECHNIQUE: Contiguous axial images were obtained from the base of the skull through the vertex without intravenous contrast.  COMPARISON:  08/22/2013  FINDINGS: Skull and Sinuses:No significant abnormality.  Orbits: Gaze deviation to the right.  Brain: No definite large territory infarct. Apparent gray-white differentiation loss in the anterior right frontal lobe appears unchanged from 08/04/2013. No evidence of acute hemorrhage. Global brain atrophy with patchy bilateral cerebral white matter disease, consistent with chronic small vessel ischemia.  These results were called by telephone at the time of interpretation on 08/08/2013 at 10:50 PM to Dr. Leroy Kennedy, who verbally acknowledged these results.  IMPRESSION: 1. Negative for acute hemorrhage. No change to suggest large vessel infarct. 2. Brain atrophy and extensive chronic small vessel ischemia.   Electronically Signed   By: Tiburcio Pea M.D.   On: 08/08/2013 22:51     Assessment: 79 y.o. female brought in with altered mental status, left sided weakness, left face droop, and slurred speech. She improved rapidly in the ED. As per chart review, one of her prior stroke was hemorraghic ( couldn't confirm this by review of prior neuroimaging). Concerns about partial complex seizures, but an acute right brain stroke also possible. Will suggest admission to medicine. MRI brain. EEG. Will follow up.  Stroke Risk Factors - age, HTN, hyperlipidemia, DM, stroke  Wyatt Portela, MD [facility] [phone]  08/08/2013, 11:02 PM

## 2013-08-08 NOTE — H&P (Signed)
@LOGO @ [facility] History and Physical  Patient: Megan [name]  RUE:[id number]  DOB: 1922/05/02  DOS: the patient was seen and examined on 08/08/2013 PCP: Lillia Mountain, MD  Chief Complaint: Confusion and unresponsiveness  HPI: Megan [name] is a 79 y.o. female with Past medical history of CVA, diabetes mellitus, dyslipidemia, residual right-sided hemiparesis The patient is coming from home. The patient is not providing any history and is nonverbal history is primarily obtained from Daughter. As per the daughter the patient was at her baseline at around 8:30 PM. Throughout the day she has been active has been talking has been doing her exercise. In the afternoon when she was watching TV she started leaning towards her lab becoming nonverbal associated with left-sided facial droop and was not able to move her left side and was not responsive to verbal command it she called EMS. There has been improvement in her symptoms in the ED, but she still remains nonverbal there was no trauma there was no fall. She was recommended to start taking Aricept and Depakote but she has not started to taken it yet.other than that there was no change in her medications. She did not have any diarrhea or vomiting   Review of Systems: as mentioned in the history of present illness.  A Comprehensive review of the other systems is negative.  Past Medical History  Diagnosis Date  . Stroke   . Diabetes mellitus   . Hyperlipidemia   . Seizure    History reviewed. No pertinent past surgical history. Social History:  reports that she has never smoked. She has quit using smokeless tobacco. Her smokeless tobacco use included Snuff. She reports that she does not drink alcohol or use illicit drugs. dependent for most of her  ADL.  No Known Allergies  Family History  Problem Relation Age of Onset  . CAD Mother   . Hypertension Mother   . Diabetes Mother   . Stroke Mother     Prior to Admission  medications   Medication Sig Start Date End Date Taking? Authorizing Provider  dipyridamole-aspirin (AGGRENOX) 25-200 MG per 12 hr capsule Take 1 capsule by mouth 2 (two) times daily.   Yes Historical Provider, MD  divalproex (DEPAKOTE ER) 500 MG 24 hr tablet Take 1 tablet (500 mg total) by mouth daily. 08/08/13  Yes Micki Riley, MD  donepezil (ARICEPT) 5 MG tablet Take 1 tablet (5 mg total) by mouth at bedtime. Take one tablet daily x 1 month then two tablets daily 08/07/13  Yes Micki Riley, MD  metFORMIN (GLUCOPHAGE) 500 MG tablet Take 1,000 mg by mouth 2 (two) times daily with a meal.   Yes Historical Provider, MD  pantoprazole (PROTONIX) 40 MG tablet Take 40 mg by mouth every evening.   Yes Historical Provider, MD  rosuvastatin (CRESTOR) 5 MG tablet Take 5 mg by mouth every Monday, Wednesday, and Friday.   Yes Historical Provider, MD  sodium chloride (BRONCHO SALINE) inhaler solution Take 2 sprays by nebulization as needed.   Yes Historical Provider, MD    Physical Exam: Filed Vitals:   08/08/13 2300 08/08/13 2315 08/08/13 2316 08/08/13 2330  BP: 133/91 148/69  143/75  Pulse: 107 88  87  Temp:   97.3 F (36.3 C)   TempSrc:      Resp: 20 19  23   Height:      Weight:      SpO2: 100% 100%  100%    General: Alert, Awake, following commands. Appear in  mild distress Eyes: PERRL ENT: Oral Mucosa clear dry. Neck: no JVD Cardiovascular: S1 and S2 Present, no Murmur, Peripheral Pulses Present Respiratory: Bilateral Air entry equal and Decreased, Clear to Auscultation,  no Crackles,no wheezes Abdomen: Bowel Sound Present, Soft and Non tender Skin: no Rash Extremities: no Pedal edema, no calf tenderness Neurologic: Mental status alert and awake nonverbal, Cranial Nerves  pupils are reactive gag present, Motor strength  limited on the right side spontaneous movement of the left side good strength, Sensation  difficult to assess, reflexes  presence, babinski  negative, Proprioception   difficult to assess, Cerebellar test  difficult to assess.  Labs on Admission:  CBC:  Recent Labs Lab 08/04/13 1121 08/08/13 2228 08/08/13 2239  WBC 7.1 6.3  --   NEUTROABS 4.5 3.2  --   HGB 9.6* 10.1* 11.2*  HCT 28.2* 30.6* 33.0*  MCV 93.7 94.2  --   PLT 317 302  --     CMP     Component Value Date/Time   NA 134* 08/08/2013 2239   K 4.8 08/08/2013 2239   CL 99 08/08/2013 2239   CO2 21 08/08/2013 2228   GLUCOSE 141* 08/08/2013 2239   BUN 15 08/08/2013 2239   CREATININE 1.10 08/08/2013 2239   CALCIUM 9.8 08/08/2013 2228   PROT 7.6 08/08/2013 2228   ALBUMIN 3.5 08/08/2013 2228   AST 18 08/08/2013 2228   ALT 7 08/08/2013 2228   ALKPHOS 65 08/08/2013 2228   BILITOT 0.2* 08/08/2013 2228   GFRNONAA 54* 08/08/2013 2228   GFRAA 63* 08/08/2013 2228    No results found for this basename: LIPASE, AMYLASE,  in the last 168 hours No results found for this basename: AMMONIA,  in the last 168 hours   Recent Labs Lab 08/04/13 1144 08/08/13 2228  TROPONINI <0.30 <0.30   BNP (last 3 results) No results found for this basename: PROBNP,  in the last 8760 hours  Radiological Exams on Admission: Ct Head (brain) Wo Contrast  08/08/2013   CLINICAL DATA:  Code stroke. Right-sided deficit with gazing to the right.  EXAM: CT HEAD WITHOUT CONTRAST  TECHNIQUE: Contiguous axial images were obtained from the base of the skull through the vertex without intravenous contrast.  COMPARISON:  08/22/2013  FINDINGS: Skull and Sinuses:No significant abnormality.  Orbits: Gaze deviation to the right.  Brain: No definite large territory infarct. Apparent gray-white differentiation loss in the anterior right frontal lobe appears unchanged from 08/04/2013. No evidence of acute hemorrhage. Global brain atrophy with patchy bilateral cerebral white matter disease, consistent with chronic small vessel ischemia.  These results were called by telephone at the time of interpretation on 08/08/2013 at 10:50 PM to  Dr. Leroy Kennedy, who verbally acknowledged these results.  IMPRESSION: 1. Negative for acute hemorrhage. No change to suggest large vessel infarct. 2. Brain atrophy and extensive chronic small vessel ischemia.   Electronically Signed   By: Tiburcio Pea M.D.   On: 08/08/2013 22:51    EKG: Independently reviewed. nonspecific ST and T waves changes.  Assessment/Plan Principal Problem:   Acute encephalopathy Active Problems:   TIA (transient ischemic attack)   Diabetes mellitus   HTN (hypertension)   CVA (cerebral infarction)   1. Acute encephalopathy  the patient is presenting with complaints of altered mental status and being unresponsive. She has been seen by neurology both as an outpatient yesterday as well as inpatient today. Her initial CT scan does not show any acute abnormality. She will be admitted for serial neuro  checks, she'll be monitored on telemetry, will be performing an MRI of the brain. As well as an EEG to rule out any focal partial complex seizure Other workup at present appears negative for any metabolic etiology for her presentation.  2. diabetes mellitus Placing her on sliding scale currently n.p.o.  3. Hypertension Resume antihypertensives tomorrow  4. CVA  Continue Aggrenox  Consults:  Neurology  DVT Prophylaxis: subcutaneous Heparin Nutrition:  N.p.o.  Code Status:  Full  Family Communication:  Daughter was present at bedside, opportunity was given to ask question and all questions were answered satisfactorily at the time of interview. Disposition: Admitted to inpatient in telemetry unit.  Author: Lynden Oxford, MD [facility] Pager: [phone] 08/08/2013, 11:58 PM    If 7PM-7AM, please contact night-coverage [email or web address] Password [email or web address]

## 2013-08-08 NOTE — ED Notes (Signed)
Per ems-- pt arrived via [facility] as CODE STROKE. LSN 2030 per daughter. Pt presents with L sided weakness, facial droop(more than normal due to dentures). Pt also with gaze to R and slurred speech. Pt with hx stroke June 2012 and Feb 2014 with R sided deficients (one was hemorraghic). Pt also seen in May 2013 for possible seizures. Pt saw neurologist yesterday and was drowsy and cognition not at baseline. Pt taking aggronox. Code stroke called at 2217, Pt arrival 2225, phlebotomy arrived 2225, Stroke team arrival 2226, neurologist arrival 2227, EDP exam 2228, arrival to CT 2230.

## 2013-08-09 ENCOUNTER — Inpatient Hospital Stay (HOSPITAL_COMMUNITY): Payer: Medicare Other

## 2013-08-09 ENCOUNTER — Encounter (HOSPITAL_COMMUNITY): Payer: Self-pay | Admitting: Emergency Medicine

## 2013-08-09 DIAGNOSIS — R4182 Altered mental status, unspecified: Secondary | ICD-10-CM

## 2013-08-09 DIAGNOSIS — M6281 Muscle weakness (generalized): Secondary | ICD-10-CM

## 2013-08-09 DIAGNOSIS — R569 Unspecified convulsions: Secondary | ICD-10-CM

## 2013-08-09 DIAGNOSIS — G9340 Encephalopathy, unspecified: Secondary | ICD-10-CM | POA: Insufficient documentation

## 2013-08-09 LAB — URINALYSIS, ROUTINE W REFLEX MICROSCOPIC
Bilirubin Urine: NEGATIVE
Hgb urine dipstick: NEGATIVE
Leukocytes, UA: NEGATIVE
Nitrite: NEGATIVE
Protein, ur: NEGATIVE mg/dL
Specific Gravity, Urine: 1.006 (ref 1.005–1.030)
Urobilinogen, UA: 0.2 mg/dL (ref 0.0–1.0)

## 2013-08-09 LAB — GLUCOSE, CAPILLARY
Glucose-Capillary: 112 mg/dL — ABNORMAL HIGH (ref 70–99)
Glucose-Capillary: 115 mg/dL — ABNORMAL HIGH (ref 70–99)

## 2013-08-09 LAB — CBC WITH DIFFERENTIAL/PLATELET
Basophils Absolute: 0 10*3/uL (ref 0.0–0.1)
Eosinophils Absolute: 0.1 10*3/uL (ref 0.0–0.7)
Eosinophils Relative: 2 % (ref 0–5)
HCT: 30 % — ABNORMAL LOW (ref 36.0–46.0)
MCH: 31.7 pg (ref 26.0–34.0)
MCV: 93.2 fL (ref 78.0–100.0)
Monocytes Absolute: 0.6 10*3/uL (ref 0.1–1.0)
Neutrophils Relative %: 62 % (ref 43–77)
Platelets: 381 10*3/uL (ref 150–400)
RDW: 13 % (ref 11.5–15.5)
WBC: 7 10*3/uL (ref 4.0–10.5)

## 2013-08-09 LAB — COMPREHENSIVE METABOLIC PANEL
ALT: 5 U/L (ref 0–35)
Alkaline Phosphatase: 60 U/L (ref 39–117)
BUN: 13 mg/dL (ref 6–23)
CO2: 22 mEq/L (ref 19–32)
Chloride: 98 mEq/L (ref 96–112)
GFR calc Af Amer: 71 mL/min — ABNORMAL LOW (ref >90)
GFR calc non Af Amer: 62 mL/min — ABNORMAL LOW (ref >90)
Glucose, Bld: 118 mg/dL — ABNORMAL HIGH (ref 70–99)
Potassium: 5.1 mEq/L (ref 3.5–5.1)
Total Bilirubin: 0.2 mg/dL — ABNORMAL LOW (ref 0.3–1.2)
Total Protein: 6.7 g/dL (ref 6.0–8.3)

## 2013-08-09 LAB — PROTIME-INR
INR: 0.96 (ref 0.00–1.49)
Prothrombin Time: 12.6 seconds (ref 11.6–15.2)

## 2013-08-09 MED ORDER — PANTOPRAZOLE SODIUM 40 MG PO TBEC
40.0000 mg | DELAYED_RELEASE_TABLET | Freq: Every day | ORAL | Status: DC
  Administered 2013-08-09 – 2013-08-15 (×5): 40 mg via ORAL
  Filled 2013-08-09 (×5): qty 1

## 2013-08-09 MED ORDER — VALPROATE SODIUM 500 MG/5ML IV SOLN
250.0000 mg | Freq: Two times a day (BID) | INTRAVENOUS | Status: DC
  Filled 2013-08-09 (×2): qty 2.5

## 2013-08-09 MED ORDER — INSULIN ASPART 100 UNIT/ML ~~LOC~~ SOLN
0.0000 [IU] | Freq: Four times a day (QID) | SUBCUTANEOUS | Status: DC
  Administered 2013-08-09 – 2013-08-10 (×2): 2 [IU] via SUBCUTANEOUS

## 2013-08-09 MED ORDER — ASPIRIN-DIPYRIDAMOLE ER 25-200 MG PO CP12
1.0000 | ORAL_CAPSULE | Freq: Two times a day (BID) | ORAL | Status: DC
  Administered 2013-08-09 – 2013-08-10 (×2): 1 via ORAL
  Filled 2013-08-09 (×7): qty 1

## 2013-08-09 MED ORDER — LORAZEPAM 2 MG/ML IJ SOLN
0.5000 mg | Freq: Once | INTRAMUSCULAR | Status: AC
  Administered 2013-08-09: 0.5 mg via INTRAVENOUS
  Filled 2013-08-09: qty 1

## 2013-08-09 MED ORDER — INSULIN ASPART 100 UNIT/ML ~~LOC~~ SOLN: 0.0000 [IU] | Freq: Every day | SUBCUTANEOUS | Status: DC

## 2013-08-09 MED ORDER — VALPROATE SODIUM 500 MG/5ML IV SOLN
250.0000 mg | Freq: Once | INTRAVENOUS | Status: DC
  Filled 2013-08-09: qty 2.5

## 2013-08-09 MED ORDER — DIVALPROEX SODIUM 250 MG PO DR TAB
250.0000 mg | DELAYED_RELEASE_TABLET | Freq: Two times a day (BID) | ORAL | Status: DC
  Administered 2013-08-09: 250 mg via ORAL
  Filled 2013-08-09 (×4): qty 1

## 2013-08-09 MED ORDER — LORAZEPAM BOLUS VIA INFUSION
1.0000 mg | INTRAVENOUS | Status: DC | PRN
  Filled 2013-08-09: qty 1

## 2013-08-09 MED ORDER — PANTOPRAZOLE SODIUM 40 MG IV SOLR
40.0000 mg | INTRAVENOUS | Status: DC
  Filled 2013-08-09 (×2): qty 40

## 2013-08-09 MED ORDER — VALPROATE SODIUM 500 MG/5ML IV SOLN: 500.0000 mg | Freq: Two times a day (BID) | INTRAVENOUS | Status: DC

## 2013-08-09 MED ORDER — VALPROATE SODIUM 500 MG/5ML IV SOLN
500.0000 mg | Freq: Once | INTRAVENOUS | Status: AC
  Administered 2013-08-09: 500 mg via INTRAVENOUS
  Filled 2013-08-09: qty 5

## 2013-08-09 MED ORDER — ENSURE COMPLETE PO LIQD
237.0000 mL | Freq: Two times a day (BID) | ORAL | Status: DC
  Administered 2013-08-09: 237 mL via ORAL

## 2013-08-09 MED ORDER — ATORVASTATIN CALCIUM 40 MG PO TABS
40.0000 mg | ORAL_TABLET | Freq: Every day | ORAL | Status: DC
  Administered 2013-08-09 – 2013-08-12 (×2): 40 mg via ORAL
  Filled 2013-08-09 (×5): qty 1

## 2013-08-09 NOTE — Progress Notes (Signed)
EEG Completed; Results Pending  

## 2013-08-09 NOTE — Progress Notes (Signed)
SLP Cancellation Note  Patient Details Name: Megan [name] MRN: [id number] DOB: 1922-03-11   Cancelled treatment:       Reason Eval/Treat Not Completed: Patient at procedure or test/unavailable   Lucille Witts, Riley Nearing 08/09/2013, 10:09 AM

## 2013-08-09 NOTE — Evaluation (Signed)
Physical Therapy Evaluation Patient Details Name: Megan [name] MRN: [id number] DOB: 1922/03/31 Today's Date: 08/09/2013 Time: 2130-8657 PT Time Calculation (min): 21 min  PT Assessment / Plan / Recommendation History of Present Illness  pt presents with Encephalopathy and r/o CVA.  pt with hx of CVA.    Clinical Impression  Pt well known to this PT from previous admission.  Pt continues to require A for all mobility and per daughter D/C plan is for return to home.  Pt will need 24hr A and HHPT for safety.      PT Assessment  Patient needs continued PT services    Follow Up Recommendations  Home health PT;Supervision/Assistance - 24 hour    Does the patient have the potential to tolerate intense rehabilitation      Barriers to Discharge        Equipment Recommendations  None recommended by PT    Recommendations for Other Services     Frequency Min 3X/week    Precautions / Restrictions Precautions Precautions: Fall Restrictions Weight Bearing Restrictions: No   Pertinent Vitals/Pain Denied pain.        Mobility  Bed Mobility Bed Mobility: Supine to Sit;Sitting - Scoot to Edge of Bed;Sit to Supine Supine to Sit: 3: Mod assist;HOB elevated Sitting - Scoot to Edge of Bed: 1: +1 Total assist Sit to Supine: 2: Max assist Details for Bed Mobility Assistance: Gestural cues needed 2/2 pt very HOH.   Transfers Transfers: Sit to Stand;Stand to Sit Sit to Stand: 4: Min assist;With upper extremity assist;From bed Stand to Sit: 4: Min assist;With upper extremity assist;To bed Details for Transfer Assistance: Placed chair in front of pt.  pt used chair to pull up with her L UE with PT blocking Bil feet from sliding.  pt tends to lean R in standing and has difficulty WBing through R LE.   Ambulation/Gait Ambulation/Gait Assistance: Not tested (comment) Stairs: No Wheelchair Mobility Wheelchair Mobility: No Modified Rankin (Stroke Patients Only) Pre-Morbid Rankin Score:  Moderately severe disability Modified Rankin: Severe disability    Exercises     PT Diagnosis: Difficulty walking;Generalized weakness  PT Problem List: Decreased strength;Decreased activity tolerance;Decreased balance;Decreased mobility;Decreased coordination;Decreased cognition;Decreased knowledge of use of DME PT Treatment Interventions: DME instruction;Gait training;Functional mobility training;Therapeutic activities;Therapeutic exercise;Balance training;Neuromuscular re-education;Patient/family education     PT Goals(Current goals can be found in the care plan section) Acute Rehab PT Goals Patient Stated Goal: Per daughter to go home.   PT Goal Formulation: With family Time For Goal Achievement: 08/23/13 Potential to Achieve Goals: Fair  Visit Information  Last PT Received On: 08/09/13 Assistance Needed: +2 History of Present Illness: pt presents with Encephalopathy and r/o CVA.  pt with hx of CVA.         Prior Functioning  Home Living Family/patient expects to be discharged to:: Private residence Living Arrangements: Children Available Help at Discharge: Family;Available 24 hours/day Type of Home: House Home Access: Stairs to enter [employer] of Steps: 1 Entrance Stairs-Rails: None Home Layout: One level Home Equipment: Walker - 2 wheels;Bedside commode;Wheelchair - Lawyer Comments: uses RW with bed to [facility] transfers; walks short distances with RW; W/c for going out . Daughter states aide helps her onto [facility] in bedroom but daughter will walk her in the bathroom where it is a tighter space to use 3in1 over toilet. Aide is there M-F 7-1:30 pm Prior Function Level of Independence: Needs assistance Gait / Transfers Assistance Needed: Uses RW to amb short distances only.  ADL's / Homemaking Assistance Needed: Family perform all homemaking.  Family and Aide A with ADLs.   Communication / Swallowing Assistance Needed: pt very HOH making  communication difficult.   Communication Communication: HOH;Expressive difficulties Dominant Hand: Left    Cognition  Cognition Arousal/Alertness: Awake/alert Behavior During Therapy: Flat affect Overall Cognitive Status: Difficult to assess Difficult to assess due to: Hard of hearing/deaf    Extremity/Trunk Assessment Upper Extremity Assessment Upper Extremity Assessment: Defer to OT evaluation Lower Extremity Assessment Lower Extremity Assessment: RLE deficits/detail;LLE deficits/detail RLE Deficits / Details: Increased extensor tone and generally weak since old CVA.   RLE Coordination: decreased fine motor;decreased gross motor LLE Deficits / Details: Generally weak, but no changes in tone or sensation noted.   Cervical / Trunk Assessment Cervical / Trunk Assessment: Kyphotic   Balance Balance Balance Assessed: Yes Static Standing Balance Static Standing - Balance Support: Left upper extremity supported Static Standing - Level of Assistance: 4: Min assist Static Standing - Comment/# of Minutes: pt uses L UE on back of recliner for support and leans to R side.    End of Session PT - End of Session Equipment Utilized During Treatment: Gait belt Activity Tolerance: Patient limited by fatigue Patient left: in bed (with transport for MRI.  ) Nurse Communication: Mobility status  GP     Sunny Schlein, PT [phone] 08/09/2013, 2:18 PM

## 2013-08-09 NOTE — Progress Notes (Signed)
Utilization review completed. Anja Neuzil, RN, BSN. 

## 2013-08-09 NOTE — Progress Notes (Addendum)
Subjective: The patient was admitted overnight with decreased responsiveness, left-sided weakness, left facial droop, and slurred speech.  This morning, symptoms of left-sided weakness have mildly improved, but left facial droop, slurred speech, and decreased responsiveness persist.  The patient's daughter is at bedside, and notes that at baseline the patient speak in complete sentences and move her left arm and leg without difficulty.  Objective: Current vital signs: BP 144/75  Pulse 101  Temp(Src) 98.4 F (36.9 C) (Oral)  Resp 20  Ht 5\' 1"  (1.549 m)  Wt 118 lb (53.524 kg)  BMI 22.31 kg/m2  SpO2 100% Vital signs in last 24 hours: Temp:  [97.1 F (36.2 C)-98.4 F (36.9 C)] 98.4 F (36.9 C) (12/18 0435) Pulse Rate:  [72-107] 101 (12/18 0435) Resp:  [19-23] 20 (12/18 0435) BP: (133-166)/(66-91) 144/75 mmHg (12/18 0435) SpO2:  [98 %-100 %] 100 % (12/18 0435) Weight:  [118 lb (53.524 kg)] 118 lb (53.524 kg) (12/17 2256)  Intake/Output from previous day:   Intake/Output this shift:   Nutritional status: NPO  Neurologic Exam: Mental Status:  Alert, recognizes family members, no speech Cranial Nerves:  II - patient blinks to bilateral confrontation, PERRL III/IV/VI - possible right gaze preference V/VII - left-sided facial droop VIII - difficulty hearing bilaterally (chronic) IX,X - palate elevates bilaterally XI: unable to fully participate XII: tongue strength normal  Motor: increased tone of right arm and leg (chronic, from old CVA).  Moves L arm and leg spontaneously but unable to fully participate in strength testing Sensory: unable to assess Deep Tendon Reflexes: 1+ bilaterally Cerebellar: unable to assess Carotid auscultation: No bruit  Lab Results: Results for orders placed during the hospital encounter of 08/08/13 (from the past 48 hour(s))  PROTIME-INR     Status: None   Collection Time    08/08/13 10:28 PM      Result Value Range   Prothrombin Time 13.0  11.6  - 15.2 seconds   INR 1.00  0.00 - 1.49  APTT     Status: None   Collection Time    08/08/13 10:28 PM      Result Value Range   aPTT 28  24 - 37 seconds  CBC     Status: Abnormal   Collection Time    08/08/13 10:28 PM      Result Value Range   WBC 6.3  4.0 - 10.5 K/uL   RBC 3.25 (*) 3.87 - 5.11 MIL/uL   Hemoglobin 10.1 (*) 12.0 - 15.0 g/dL   HCT 09.8 (*) 11.9 - 14.7 %   MCV 94.2  78.0 - 100.0 fL   MCH 31.1  26.0 - 34.0 pg   MCHC 33.0  30.0 - 36.0 g/dL   RDW 82.9  56.2 - 13.0 %   Platelets 302  150 - 400 K/uL  DIFFERENTIAL     Status: None   Collection Time    08/08/13 10:28 PM      Result Value Range   Neutrophils Relative % 50  43 - 77 %   Neutro Abs 3.2  1.7 - 7.7 K/uL   Lymphocytes Relative 39  12 - 46 %   Lymphs Abs 2.5  0.7 - 4.0 K/uL   Monocytes Relative 8  3 - 12 %   Monocytes Absolute 0.5  0.1 - 1.0 K/uL   Eosinophils Relative 2  0 - 5 %   Eosinophils Absolute 0.2  0.0 - 0.7 K/uL   Basophils Relative 0  0 - 1 %  Basophils Absolute 0.0  0.0 - 0.1 K/uL  COMPREHENSIVE METABOLIC PANEL     Status: Abnormal   Collection Time    08/08/13 10:28 PM      Result Value Range   Sodium 130 (*) 135 - 145 mEq/L   Potassium 5.0  3.5 - 5.1 mEq/L   Chloride 95 (*) 96 - 112 mEq/L   CO2 21  19 - 32 mEq/L   Glucose, Bld 140 (*) 70 - 99 mg/dL   BUN 15  6 - 23 mg/dL   Creatinine, Ser 0.98  0.50 - 1.10 mg/dL   Calcium 9.8  8.4 - 11.9 mg/dL   Total Protein 7.6  6.0 - 8.3 g/dL   Albumin 3.5  3.5 - 5.2 g/dL   AST 18  0 - 37 U/L   ALT 7  0 - 35 U/L   Alkaline Phosphatase 65  39 - 117 U/L   Total Bilirubin 0.2 (*) 0.3 - 1.2 mg/dL   GFR calc non Af Amer 54 (*) >90 mL/min   GFR calc Af Amer 63 (*) >90 mL/min   Comment: (NOTE)     The eGFR has been calculated using the CKD EPI equation.     This calculation has not been validated in all clinical situations.     eGFR's persistently <90 mL/min signify possible Chronic Kidney     Disease.  TROPONIN I     Status: None   Collection Time     08/08/13 10:28 PM      Result Value Range   Troponin I <0.30  <0.30 ng/mL   Comment:            Due to the release kinetics of cTnI,     a negative result within the first hours     of the onset of symptoms does not rule out     myocardial infarction with certainty.     If myocardial infarction is still suspected,     repeat the test at appropriate intervals.  POCT I-STAT TROPONIN I     Status: None   Collection Time    08/08/13 10:36 PM      Result Value Range   Troponin i, poc 0.00  0.00 - 0.08 ng/mL   Comment 3            Comment: Due to the release kinetics of cTnI,     a negative result within the first hours     of the onset of symptoms does not rule out     myocardial infarction with certainty.     If myocardial infarction is still suspected,     repeat the test at appropriate intervals.  POCT I-STAT, CHEM 8     Status: Abnormal   Collection Time    08/08/13 10:39 PM      Result Value Range   Sodium 134 (*) 135 - 145 mEq/L   Potassium 4.8  3.5 - 5.1 mEq/L   Chloride 99  96 - 112 mEq/L   BUN 15  6 - 23 mg/dL   Creatinine, Ser 1.47  0.50 - 1.10 mg/dL   Glucose, Bld 829 (*) 70 - 99 mg/dL   Calcium, Ion 5.62  1.30 - 1.30 mmol/L   TCO2 23  0 - 100 mmol/L   Hemoglobin 11.2 (*) 12.0 - 15.0 g/dL   HCT 86.5 (*) 78.4 - 69.6 %  GLUCOSE, CAPILLARY     Status: Abnormal   Collection Time    08/08/13  10:54 PM      Result Value Range   Glucose-Capillary 134 (*) 70 - 99 mg/dL  GLUCOSE, CAPILLARY     Status: Abnormal   Collection Time    08/09/13  1:07 AM      Result Value Range   Glucose-Capillary 112 (*) 70 - 99 mg/dL   Comment 1 Documented in Chart     Comment 2 Notify RN    COMPREHENSIVE METABOLIC PANEL     Status: Abnormal   Collection Time    08/09/13  5:40 AM      Result Value Range   Sodium 131 (*) 135 - 145 mEq/L   Potassium 5.1  3.5 - 5.1 mEq/L   Chloride 98  96 - 112 mEq/L   CO2 22  19 - 32 mEq/L   Glucose, Bld 118 (*) 70 - 99 mg/dL   BUN 13  6 - 23 mg/dL    Creatinine, Ser 1.61  0.50 - 1.10 mg/dL   Calcium 9.9  8.4 - 09.6 mg/dL   Total Protein 6.7  6.0 - 8.3 g/dL   Albumin 3.2 (*) 3.5 - 5.2 g/dL   AST 15  0 - 37 U/L   Comment: HEMOLYSIS AT THIS LEVEL MAY AFFECT RESULT   ALT 5  0 - 35 U/L   Alkaline Phosphatase 60  39 - 117 U/L   Total Bilirubin 0.2 (*) 0.3 - 1.2 mg/dL   GFR calc non Af Amer 62 (*) >90 mL/min   GFR calc Af Amer 71 (*) >90 mL/min   Comment: (NOTE)     The eGFR has been calculated using the CKD EPI equation.     This calculation has not been validated in all clinical situations.     eGFR's persistently <90 mL/min signify possible Chronic Kidney     Disease.  GLUCOSE, CAPILLARY     Status: Abnormal   Collection Time    08/09/13  7:10 AM      Result Value Range   Glucose-Capillary 114 (*) 70 - 99 mg/dL  PROTIME-INR     Status: None   Collection Time    08/09/13  7:47 AM      Result Value Range   Prothrombin Time 12.6  11.6 - 15.2 seconds   INR 0.96  0.00 - 1.49  CBC WITH DIFFERENTIAL     Status: Abnormal   Collection Time    08/09/13  7:47 AM      Result Value Range   WBC 7.0  4.0 - 10.5 K/uL   RBC 3.22 (*) 3.87 - 5.11 MIL/uL   Hemoglobin 10.2 (*) 12.0 - 15.0 g/dL   HCT 04.5 (*) 40.9 - 81.1 %   MCV 93.2  78.0 - 100.0 fL   MCH 31.7  26.0 - 34.0 pg   MCHC 34.0  30.0 - 36.0 g/dL   RDW 91.4  78.2 - 95.6 %   Platelets 381  150 - 400 K/uL   Neutrophils Relative % 62  43 - 77 %   Neutro Abs 4.3  1.7 - 7.7 K/uL   Lymphocytes Relative 27  12 - 46 %   Lymphs Abs 1.9  0.7 - 4.0 K/uL   Monocytes Relative 9  3 - 12 %   Monocytes Absolute 0.6  0.1 - 1.0 K/uL   Eosinophils Relative 2  0 - 5 %   Eosinophils Absolute 0.1  0.0 - 0.7 K/uL   Basophils Relative 0  0 - 1 %   Basophils  Absolute 0.0  0.0 - 0.1 K/uL    Recent Results (from the past 240 hour(s))  URINE CULTURE     Status: None   Collection Time    08/04/13 12:27 PM      Result Value Range Status   Specimen Description URINE, CLEAN CATCH   Final   Special  Requests NONE   Final   Culture  Setup Time     Final   Value: 08/04/2013 17:17     Performed at [facility] Count     Final   Value: NO GROWTH     Performed at [facility]   Culture     Final   Value: NO GROWTH     Performed at [facility]   Report Status 08/05/2013 FINAL   Final    Lipid Panel No results found for this basename: CHOL, TRIG, HDL, CHOLHDL, VLDL, LDLCALC,  in the last 72 hours  Studies/Results: Ct Head (brain) Wo Contrast  08/08/2013   CLINICAL DATA:  Code stroke. Right-sided deficit with gazing to the right.  EXAM: CT HEAD WITHOUT CONTRAST  TECHNIQUE: Contiguous axial images were obtained from the base of the skull through the vertex without intravenous contrast.  COMPARISON:  08/22/2013  FINDINGS: Skull and Sinuses:No significant abnormality.  Orbits: Gaze deviation to the right.  Brain: No definite large territory infarct. Apparent gray-white differentiation loss in the anterior right frontal lobe appears unchanged from 08/04/2013. No evidence of acute hemorrhage. Global brain atrophy with patchy bilateral cerebral white matter disease, consistent with chronic small vessel ischemia.  These results were called by telephone at the time of interpretation on 08/08/2013 at 10:50 PM to Dr. Leroy Kennedy, who verbally acknowledged these results.  IMPRESSION: 1. Negative for acute hemorrhage. No change to suggest large vessel infarct. 2. Brain atrophy and extensive chronic small vessel ischemia.   Electronically Signed   By: Tiburcio Pea M.D.   On: 08/08/2013 22:51    Medications: I have reviewed the patient's current medications. Scheduled Meds: . atorvastatin  40 mg Oral q1800  . dipyridamole-aspirin  1 capsule Oral BID  . insulin aspart  0-15 Units Subcutaneous Q6H  . insulin aspart  0-5 Units Subcutaneous QHS  . pantoprazole (PROTONIX) IV  40 mg Intravenous Q24H   Continuous Infusions:  PRN Meds:.  Assessment/Plan: The patient is an 79  yo woman, history of CVA (with residual spastic R hemiparesis), DM, hyperlipidemia, presenting with altered mental status, left-sided weakness and facial droop, and slurred speech.  Of note, EEG 12/16 showed partial complex seizures, and the patient was recommended to start depakote (not yet purchased).  Current symptoms may represent right brain stroke vs partial complex seizure.  Stroke risk factors of DM, hyperlipidemia, prior CVA, HTN, age.  Dopplers and Echo performed last month, unremarkable.  A1C = 6.5 (11/21), LDL = 44 (11/22).  Recommendations: 1. MRI pending.  Would not repeat remainder of stroke work up at this time since it was performed wihtin the past month. 2. EEG pending 3. Concern still remains for seizure, particularly based on current examination.  Would give first dose of Depakote IV at 500mg  then convert to po dosing at 250mg  BID 4. Depakote level in AM   LOS: 1 day   Janalyn Harder, PGY3 Pgr. [phone]  08/09/2013  8:52 AM  Patient seen and examined with above resident.  History reviewed again with family and unclear if the patient was initially generally weak as opposed to having  just left sided weakness.  With altered speech and alertness seizure still remains high on the differential.  Clinical course and management discussed.  Necessary edits performed.  I agree with the above.     Thana Farr, MD [facility] [phone]  08/09/2013  12:07 PM

## 2013-08-09 NOTE — Progress Notes (Signed)
Subjective: Awake but not verbal this am  Objective: Vital signs in last 24 hours: Temp:  [97.1 F (36.2 C)-97.3 F (36.3 C)] 97.1 F (36.2 C) (12/18 0015) Pulse Rate:  [72-107] 78 (12/18 0015) Resp:  [19-23] 20 (12/18 0015) BP: (133-166)/(66-91) 166/66 mmHg (12/18 0015) SpO2:  [98 %-100 %] 100 % (12/18 0015) Weight:  [53.524 kg (118 lb)] 53.524 kg (118 lb) (12/17 2256) Weight change:  Last BM Date: 08/08/13  Intake/Output from previous day:   Intake/Output this shift:    General appearance: slowed mentation Resp: clear to auscultation bilaterally Cardio: regular rate and rhythm, S1, S2 normal, no murmur, click, rub or gallop Extremities: extremities normal, atraumatic, no cyanosis or edema  Lab Results:  Recent Labs  08/08/13 2228 08/08/13 2239  WBC 6.3  --   HGB 10.1* 11.2*  HCT 30.6* 33.0*  PLT 302  --    BMET  Recent Labs  08/08/13 2228 08/08/13 2239 08/09/13 0540  NA 130* 134* 131*  K 5.0 4.8 5.1  CL 95* 99 98  CO2 21  --  22  GLUCOSE 140* 141* 118*  BUN 15 15 13   CREATININE 0.91 1.10 0.82  CALCIUM 9.8  --  9.9    Studies/Results: Ct Head (brain) Wo Contrast  08/08/2013   CLINICAL DATA:  Code stroke. Right-sided deficit with gazing to the right.  EXAM: CT HEAD WITHOUT CONTRAST  TECHNIQUE: Contiguous axial images were obtained from the base of the skull through the vertex without intravenous contrast.  COMPARISON:  08/22/2013  FINDINGS: Skull and Sinuses:No significant abnormality.  Orbits: Gaze deviation to the right.  Brain: No definite large territory infarct. Apparent gray-white differentiation loss in the anterior right frontal lobe appears unchanged from 08/04/2013. No evidence of acute hemorrhage. Global brain atrophy with patchy bilateral cerebral white matter disease, consistent with chronic small vessel ischemia.  These results were called by telephone at the time of interpretation on 08/08/2013 at 10:50 PM to Dr. Leroy Kennedy, who verbally  acknowledged these results.  IMPRESSION: 1. Negative for acute hemorrhage. No change to suggest large vessel infarct. 2. Brain atrophy and extensive chronic small vessel ischemia.   Electronically Signed   By: Tiburcio Pea M.D.   On: 08/08/2013 22:51    Medications: I have reviewed the patient's current medications.  Assessment/Plan: Principal Problem:   Altered mental status episode of decreased mentation and L sided weakness, suggesting possible partial complex seizures, mri brain and EEG ordered, treatment per neurology Active Problems:   Diabetes mellitus controlled on ssi, metformin held   HTN (hypertension) has not required medication   LOS: 1 day   [employer] [name] 08/09/2013, 7:50 AM

## 2013-08-09 NOTE — Evaluation (Signed)
Occupational Therapy Evaluation Patient Details Name: Megan [name] MRN: [id number] DOB: 08/13/1922 Today's Date: 08/09/2013 Time: 9562-1308 OT Time Calculation (min): 26 min  OT Assessment / Plan / Recommendation History of present illness pt presents with Encephalopathy and r/o CVA.  pt with hx of CVA.     Clinical Impression   Pt admitted with above. Pt currently with functional limitations due to the deficits listed below (see OT Problem List). Pt will benefit from skilled OT to increase their safety and independence with ADL and functional mobility for ADL to facilitate discharge to venue listed below. Daughter reports that patient performed very little of her BADLs PTA.     OT Assessment  Patient needs continued OT Services    Follow Up Recommendations  Home health OT    Equipment Recommendations  None recommended by OT    Frequency  Min 2X/week    Precautions / Restrictions Precautions Precautions: Fall Restrictions Weight Bearing Restrictions: No   Pertinent Vitals/Pain Non indicated    ADL  Upper Body Bathing: Simulated;+1 Total assistance Where Assessed - Upper Body Bathing: Supported sitting Lower Body Bathing: Simulated;+2 Total assistance Lower Body Bathing: Patient Percentage:  (5-10%) Where Assessed - Lower Body Bathing: Supported standing;Supported sitting Toilet Transfer: Performed;+2 Total assistance Toilet Transfer: Patient Percentage: 20% Toilet Transfer Method: Sit to stand;Stand pivot Acupuncturist: Extra wide drop arm bedside commode Toileting - Clothing Manipulation and Hygiene: Performed;Maximal assistance Where Assessed - Glass blower/designer Manipulation and Hygiene: Sit on 3-in-1 or toilet;Standing Transfers/Ambulation Related to ADLs: RLE extensor tone and patient is also reluctant to put weight into RLE.  Patient occasionally attempts to use RUE for some functional mobility related to BADL tasks. ADL Comments: Daughter reports that  patient only took sponge baths PTA and performed less than 25% of bath or dress tasks.  Patient has also developed what daughter describes as 'learned helplessness'.    OT Diagnosis: Generalized weakness;Hemiplegia non-dominant side;Altered mental status  OT Problem List: Decreased strength;Decreased activity tolerance;Impaired tone;Decreased safety awareness OT Treatment Interventions: Self-care/ADL training;Neuromuscular education;DME and/or AE instruction;Therapeutic activities;Patient/family education   OT Goals(Current goals can be found in the care plan section) Acute Rehab OT Goals Patient Stated Goal: Per daughter to go home.   OT Goal Formulation: With patient/family Time For Goal Achievement: 08/23/13 Potential to Achieve Goals: Fair  Visit Information  Last OT Received On: 08/09/13 Assistance Needed: +2 History of Present Illness: pt presents with Encephalopathy and r/o CVA.  pt with hx of CVA.         Prior Functioning     Home Living Family/patient expects to be discharged to:: Private residence Living Arrangements: Children Available Help at Discharge: Family;Available 24 hours/day Type of Home: House Home Access: Stairs to enter [employer] of Steps: 1 Entrance Stairs-Rails: None Home Layout: One level Home Equipment: Walker - 2 wheels;Bedside commode;Wheelchair - manual;Shower seat Additional Comments: Patient took sponge bathes PTA. uses RW with bed to [facility] transfers; walks short distances with RW; W/c for going out . Daughter states aide helps her onto [facility] in bedroom but daughter will walk her in the bathroom where it is a tighter space to use 3in1 over toilet. Aide is there M-F 7-1:30 pm Prior Function Level of Independence: Needs assistance Gait / Transfers Assistance Needed: Uses RW to amb short distances only.   ADL's / Homemaking Assistance Needed: Family perform all homemaking.  Family and Aide A with ADLs.  Patient performs "less than 25%, she  could do more when she  left rehab however over time she has just let others complete tasks for her" Communication / Swallowing Assistance Needed: pt very HOH making communication difficult.   Comments: grandson and daughter present throughout eval and assisted Communication Communication: HOH;Expressive difficulties Dominant Hand: Left    Cognition  Cognition Arousal/Alertness: Awake/alert Behavior During Therapy: Flat affect Overall Cognitive Status: Difficult to assess (h/o impairment-unsure of baseline) Difficult to assess due to: Hard of hearing/deaf    Extremity/Trunk Assessment Upper Extremity Assessment Upper Extremity Assessment: RUE deficits/detail RUE Deficits / Details: increased tone from former CVA, attempts to use RUE yet limited with shoulder and elbow movements, grip is WFL.  unable to formally assess due to [facility] and decreased cognition RUE: Unable to fully assess due to immobilization Lower Extremity Assessment Lower Extremity Assessment: Defer to PT evaluation RLE Deficits / Details: Increased extensor tone and generally weak since old CVA.   RLE Coordination: decreased fine motor;decreased gross motor LLE Deficits / Details: Generally weak, but no changes in tone or sensation noted.   Cervical / Trunk Assessment Cervical / Trunk Assessment: Kyphotic     Mobility Bed Mobility Bed Mobility: Supine to Sit;Sitting - Scoot to Edge of Bed;Sit to Supine Supine to Sit: 3: Mod assist;HOB flat Sitting - Scoot to Edge of Bed: 2: Max assist Sit to Supine: 2: Max assist Details for Bed Mobility Assistance: Gestural cues needed 2/2 pt very HOH.   Transfers Sit to Stand: With upper extremity assist;From bed;3: Mod assist Stand to Sit: With upper extremity assist;To bed;3: Mod assist Details for Transfer Assistance: Placed chair in front of pt.  pt used chair to pull up with her L UE with PT blocking Bil feet from sliding.  pt tends to lean R in standing and has difficulty WBing  through R LE.  Transfer bed>recliner><BSC with max-total assist due to safety and stabilize DME.     End of Session OT - End of Session Activity Tolerance: Patient limited by fatigue Patient left: in chair;with call bell/phone within reach;with family/visitor present Nurse Communication:  (incontinent of urine 2 times with standing)  GO     Megan [name] 08/09/2013, 4:23 PM

## 2013-08-09 NOTE — Evaluation (Signed)
Clinical/Bedside Swallow Evaluation Patient Details  Name: Megan [name] MRN: [id number] Date of Birth: 1921/11/23  Today's Date: 08/09/2013 Time: 1235-1305 SLP Time Calculation (min): 30 min  Past Medical History:  Past Medical History  Diagnosis Date  . Stroke   . Diabetes mellitus   . Hyperlipidemia   . Seizure    Past Surgical History: History reviewed. No pertinent past surgical history. HPI:  79 y.o. female brought in with altered mental status, left sided weakness, left face droop, and slurred speech. Vascular dementia at baseline. Per daughter, pt had previous CVA in 2012 resulting in right HP and dysphagia.  Pt now with concern for possible CVA vs partial complex seizures. For MRI today per daughter.  Bedside swallow evaluation ordered.    Assessment / Plan / Recommendation Clinical Impression  Pt presents with baseline swallow ability per daughter Pat's observation.  Daughter reports pt has lost a few pounds recently.  [name] had difficulties orally manipulating, transiting boluses likely due to dementia and h/o CVA.  Delayed pharyngeal swallow reflex suspected (up to 17 seconds) with multiple swallows observed but no s/s of aspiration.  SLP suspects multiple swallows are due to oral stasis prematurely spilling into pharynx and pt reflexively swallowing to clear.  Pt with oral stasis of softened cracker without awareness, increasing asp risk with solids.  Rec to initiate a pureed diet with thin liquid with full supervision to mitigate aspiration risk.   Note MRI pending this afternoon.  Pt able to self feed with left hand which aids swallow efficiency/effectiveness.   Skilled intervention included educating daughter to findings of BSE and recommendations.      Aspiration Risk  Moderate    Diet Recommendation Dysphagia 1 (Puree);Thin liquid   Liquid Administration via: Straw;Cup Medication Administration: Crushed with puree Supervision: Patient able to self feed;Staff to  assist with self feeding;Full supervision/cueing for compensatory strategies Compensations: Slow rate;Small sips/bites;Check for pocketing Postural Changes and/or Swallow Maneuvers: Seated upright 90 degrees;Upright 30-60 min after meal    Other  Recommendations Oral Care Recommendations: Oral care BID   Follow Up Recommendations  24 hour supervision/assistance    Frequency and Duration min 1 x/week  1 week   Pertinent Vitals/Pain Afebrile, decreased    SLP Swallow Goals     Swallow Study Prior Functional Status   pt resides at home with daughter, has caregiver when daughter works    General Date of Onset: 08/09/13 HPI: 79 y.o. female brought in with altered mental status, left sided weakness, left face droop, and slurred speech. Vascular dementia at baseline. Per daughter, pt had previous CVA in 2012 resulting in right HP and dysphagia.  Pt now with concern for possible CVA vs partial complex seizures. For MRI today per daughter.  Bedside swallow evaluation ordered.  Type of Study: Bedside swallow evaluation Diet Prior to this Study: NPO Temperature Spikes Noted: No Respiratory Status: Room air History of Recent Intubation: No Behavior/Cognition: Lethargic;Requires cueing;Doesn't follow directions;Decreased sustained attention Oral Cavity - Dentition: Dentures, not available Self-Feeding Abilities: Needs assist (self fed with left hand with assist) Patient Positioning: Upright in bed Baseline Vocal Quality: Low vocal intensity;Clear (when pt verbalizes low intensity but clear) Volitional Cough: Cognitively unable to elicit Volitional Swallow: Unable to elicit    Oral/Motor/Sensory Function Overall Oral Motor/Sensory Function: Impaired (left facial droop, ? decr right facial movement, difficult to assess due to pt's cognition, minimal oral secretions retained when eating/drinking) Labial ROM: Reduced left Labial Strength: Reduced Lingual ROM: Within Functional Limits Lingual  Symmetry: Within Functional Limits Lingual Strength: Within Functional Limits Facial Symmetry: Left droop Facial Strength: Reduced Velum: Within Functional Limits   Ice Chips Ice chips: Not tested   Thin Liquid Thin Liquid: Impaired Presentation: Self Fed;Spoon;Straw;Cup Oral Phase Impairments: Impaired anterior to posterior transit;Reduced lingual movement/coordination;Reduced labial seal Oral Phase Functional Implications: Prolonged oral transit Pharyngeal  Phase Impairments: Suspected delayed Swallow;Multiple swallows    Nectar Thick Nectar Thick Liquid: Impaired Presentation: Cup;Self Fed;Spoon;Straw Oral Phase Impairments: Reduced lingual movement/coordination;Impaired anterior to posterior transit;Reduced labial seal Oral phase functional implications: Prolonged oral transit Pharyngeal Phase Impairments: Suspected delayed Swallow;Multiple swallows   Honey Thick Honey Thick Liquid: Not tested   Puree Puree: Impaired Presentation: Spoon;Self Fed Oral Phase Impairments: Reduced lingual movement/coordination;Impaired anterior to posterior transit;Reduced labial seal Oral Phase Functional Implications: Prolonged oral transit Pharyngeal Phase Impairments: Suspected delayed Swallow   Solid   GO    Solid: Impaired Oral Phase Impairments: Impaired anterior to posterior transit;Reduced lingual movement/coordination Oral Phase Functional Implications: Oral holding;Oral residue Other Comments: excessive delay in mastication ability with soft saltine cracker with oral stasis without awareness, applesauce swallow aided oral clearance, pt has not worn dentures lately per daughter and eats soft foods at home       Megan Burnet, MS [facility] SLP [phone]

## 2013-08-09 NOTE — Progress Notes (Addendum)
NUTRITION FOLLOW UP  DOCUMENTATION CODES Per approved criteria  -Severe malnutrition in the context of chronic illness   Intervention: Continue Ensure Complete po BID, each supplement provides 350 kcal and 13 grams of protein RD to follow for nutrition care plan  New Nutrition Dx: Inadequate oral intake related to lethargy, CVA as evidenced by PO intake 0-25%, ongoing  Goal: Pt to meet >/= 90% of their estimated nutrition needs, progressing  Monitor:  PO & supplemental intake, weight, labs, I/O's  ASSESSMENT: Patient with PMH of CVA, DM, dyslipidemia, residual right-sided hemiparesis; preseented after becoming nonverbal with left-sided facial droop and was not able to move her left side; admitted for possible CVA vs partial complex seizures.  MRI of brain reviewed and shows a possible right pontine infarct but the films are highly motion degraded.  Patient remains lethargic.  PO intake remains poor at 0-25% per flowsheet records.  CWOCN note reviewed 12/21 -- 2 areas of Stage II wounds.  Receiving Ensure Complete BID.  Possible D/C tomorrow.  Height: Ht Readings from Last 1 Encounters:  08/08/13 5\' 1"  (1.549 m)    Weight: Wt Readings from Last 1 Encounters:  08/08/13 118 lb (53.524 kg)      Estimated Nutritional Needs: Kcal: 1200-1400 Protein: 55-65 gm Fluid: >/= 1.5 L  Skin: Stage II pressure ulcer to buttocks   Diet Order: Dysphagia 1, thin liquids   Intake/Output Summary (Last 24 hours) at 08/09/13 1416 Last data filed at 08/09/13 1300  Gross per 24 hour  Intake      0 ml  Output      0 ml  Net      0 ml    Labs:   Recent Labs Lab 08/04/13 1121 08/08/13 2228 08/08/13 2239 08/09/13 0540  NA 134* 130* 134* 131*  K 5.0 5.0 4.8 5.1  CL 98 95* 99 98  CO2 24 21  --  22  BUN 13 15 15 13   CREATININE 0.94 0.91 1.10 0.82  CALCIUM 9.9 9.8  --  9.9  GLUCOSE 134* 140* 141* 118*    CBG (last 3)   Recent Labs  08/08/13 2254 08/09/13 0107  08/09/13 0710  GLUCAP 134* 112* 114*    Scheduled Meds: . atorvastatin  40 mg Oral q1800  . dipyridamole-aspirin  1 capsule Oral BID  . divalproex  250 mg Oral BID  . insulin aspart  0-15 Units Subcutaneous Q6H  . insulin aspart  0-5 Units Subcutaneous QHS  . pantoprazole  40 mg Oral Daily  . valproate sodium  500 mg Intravenous Once    Continuous Infusions:   Past Medical History  Diagnosis Date  . Stroke   . Diabetes mellitus   . Hyperlipidemia   . Seizure     History reviewed. No pertinent past surgical history.  Maureen Chatters, RD, LDN Pager #: [phone] After-Hours Pager #: [phone]

## 2013-08-10 LAB — GLUCOSE, CAPILLARY
Glucose-Capillary: 137 mg/dL — ABNORMAL HIGH (ref 70–99)
Glucose-Capillary: 99 mg/dL (ref 70–99)

## 2013-08-10 MED ORDER — VALPROATE SODIUM 500 MG/5ML IV SOLN
250.0000 mg | Freq: Two times a day (BID) | INTRAVENOUS | Status: DC
  Administered 2013-08-10 – 2013-08-12 (×5): 250 mg via INTRAVENOUS
  Filled 2013-08-10 (×7): qty 2.5

## 2013-08-10 MED ORDER — METFORMIN HCL 500 MG PO TABS
500.0000 mg | ORAL_TABLET | Freq: Two times a day (BID) | ORAL | Status: DC
  Administered 2013-08-12 – 2013-08-15 (×6): 500 mg via ORAL
  Filled 2013-08-10 (×13): qty 1

## 2013-08-10 MED ORDER — SODIUM CHLORIDE 0.9 % IV SOLN
INTRAVENOUS | Status: DC
  Administered 2013-08-10 – 2013-08-12 (×2): via INTRAVENOUS

## 2013-08-10 NOTE — Progress Notes (Signed)
Pt. Continues to be lethargic, not following commands. D/W Dr. Valentina Lucks, orders received. Will monitor.

## 2013-08-10 NOTE — Progress Notes (Signed)
PT Cancellation Note  Patient Details Name: Kaina Orengo MRN: [id number] DOB: 07-Sep-1921   Cancelled Treatment:    Reason Eval/Treat Not Completed: Fatigue/lethargy limiting ability to participate. Patient received Ativan and is not easily aroused at this time. Daughters requested to hold. Will follow up as able   Erynn Vaca, Adline Potter 08/10/2013, 9:26 AM

## 2013-08-10 NOTE — Progress Notes (Signed)
Subjective: The patient was reportedly more responsive yesterday afternoon, able to feed herself and reportedly talking with family members.  She required sedation for MRI, which was still motion degraded, but showed a possible R pons stroke.  Depakote level therapeutic this morning after 2 doses yesterday.  Patient sedated this morning, likely still 2/2 ativan.  Objective: Current vital signs: BP 103/44  Pulse 100  Temp(Src) 97.5 F (36.4 C) (Axillary)  Resp 20  Ht 5\' 1"  (1.549 m)  Wt 118 lb (53.524 kg)  BMI 22.31 kg/m2  SpO2 100% Vital signs in last 24 hours: Temp:  [97.3 F (36.3 C)-98.4 F (36.9 C)] 97.5 F (36.4 C) (12/19 0610) Pulse Rate:  [52-107] 100 (12/19 0610) Resp:  [20] 20 (12/19 0610) BP: (90-166)/(44-76) 103/44 mmHg (12/19 0610) SpO2:  [88 %-100 %] 100 % (12/19 0610)  Intake/Output from previous day:   Intake/Output this shift:   Nutritional status: Dysphagia  Neurologic Exam: Mental Status:  Lying in bed, somnolent, arousable to touch Cranial Nerves:  II - patient blinks to bilateral confrontation, PERRL III/IV/VI - possible right gaze preference V/VII - left-sided facial droop VIII - difficulty hearing bilaterally (chronic) IX,X - palate elevates bilaterally XI: unable to fully participate XII: tongue strength normal  Motor: increased tone of right arm and leg (chronic, from old CVA).  Moves L arm and leg spontaneously but unable to fully participate in strength testing Sensory: unable to assess Deep Tendon Reflexes: 1+ bilaterally Cerebellar: unable to assess Carotid auscultation: No bruit  Lab Results: Results for orders placed during the hospital encounter of 08/08/13 (from the past 48 hour(s))  PROTIME-INR     Status: None   Collection Time    08/08/13 10:28 PM      Result Value Range   Prothrombin Time 13.0  11.6 - 15.2 seconds   INR 1.00  0.00 - 1.49  APTT     Status: None   Collection Time    08/08/13 10:28 PM      Result Value Range    aPTT 28  24 - 37 seconds  CBC     Status: Abnormal   Collection Time    08/08/13 10:28 PM      Result Value Range   WBC 6.3  4.0 - 10.5 K/uL   RBC 3.25 (*) 3.87 - 5.11 MIL/uL   Hemoglobin 10.1 (*) 12.0 - 15.0 g/dL   HCT 78.2 (*) 95.6 - 21.3 %   MCV 94.2  78.0 - 100.0 fL   MCH 31.1  26.0 - 34.0 pg   MCHC 33.0  30.0 - 36.0 g/dL   RDW 08.6  57.8 - 46.9 %   Platelets 302  150 - 400 K/uL  DIFFERENTIAL     Status: None   Collection Time    08/08/13 10:28 PM      Result Value Range   Neutrophils Relative % 50  43 - 77 %   Neutro Abs 3.2  1.7 - 7.7 K/uL   Lymphocytes Relative 39  12 - 46 %   Lymphs Abs 2.5  0.7 - 4.0 K/uL   Monocytes Relative 8  3 - 12 %   Monocytes Absolute 0.5  0.1 - 1.0 K/uL   Eosinophils Relative 2  0 - 5 %   Eosinophils Absolute 0.2  0.0 - 0.7 K/uL   Basophils Relative 0  0 - 1 %   Basophils Absolute 0.0  0.0 - 0.1 K/uL  COMPREHENSIVE METABOLIC PANEL     Status:  Abnormal   Collection Time    08/08/13 10:28 PM      Result Value Range   Sodium 130 (*) 135 - 145 mEq/L   Potassium 5.0  3.5 - 5.1 mEq/L   Chloride 95 (*) 96 - 112 mEq/L   CO2 21  19 - 32 mEq/L   Glucose, Bld 140 (*) 70 - 99 mg/dL   BUN 15  6 - 23 mg/dL   Creatinine, Ser 4.78  0.50 - 1.10 mg/dL   Calcium 9.8  8.4 - 29.5 mg/dL   Total Protein 7.6  6.0 - 8.3 g/dL   Albumin 3.5  3.5 - 5.2 g/dL   AST 18  0 - 37 U/L   ALT 7  0 - 35 U/L   Alkaline Phosphatase 65  39 - 117 U/L   Total Bilirubin 0.2 (*) 0.3 - 1.2 mg/dL   GFR calc non Af Amer 54 (*) >90 mL/min   GFR calc Af Amer 63 (*) >90 mL/min   Comment: (NOTE)     The eGFR has been calculated using the CKD EPI equation.     This calculation has not been validated in all clinical situations.     eGFR's persistently <90 mL/min signify possible Chronic Kidney     Disease.  TROPONIN I     Status: None   Collection Time    08/08/13 10:28 PM      Result Value Range   Troponin I <0.30  <0.30 ng/mL   Comment:            Due to the release  kinetics of cTnI,     a negative result within the first hours     of the onset of symptoms does not rule out     myocardial infarction with certainty.     If myocardial infarction is still suspected,     repeat the test at appropriate intervals.  POCT I-STAT TROPONIN I     Status: None   Collection Time    08/08/13 10:36 PM      Result Value Range   Troponin i, poc 0.00  0.00 - 0.08 ng/mL   Comment 3            Comment: Due to the release kinetics of cTnI,     a negative result within the first hours     of the onset of symptoms does not rule out     myocardial infarction with certainty.     If myocardial infarction is still suspected,     repeat the test at appropriate intervals.  POCT I-STAT, CHEM 8     Status: Abnormal   Collection Time    08/08/13 10:39 PM      Result Value Range   Sodium 134 (*) 135 - 145 mEq/L   Potassium 4.8  3.5 - 5.1 mEq/L   Chloride 99  96 - 112 mEq/L   BUN 15  6 - 23 mg/dL   Creatinine, Ser 6.21  0.50 - 1.10 mg/dL   Glucose, Bld 308 (*) 70 - 99 mg/dL   Calcium, Ion 6.57  8.46 - 1.30 mmol/L   TCO2 23  0 - 100 mmol/L   Hemoglobin 11.2 (*) 12.0 - 15.0 g/dL   HCT 96.2 (*) 95.2 - 84.1 %  GLUCOSE, CAPILLARY     Status: Abnormal   Collection Time    08/08/13 10:54 PM      Result Value Range   Glucose-Capillary 134 (*) 70 -  99 mg/dL  GLUCOSE, CAPILLARY     Status: Abnormal   Collection Time    08/09/13  1:07 AM      Result Value Range   Glucose-Capillary 112 (*) 70 - 99 mg/dL   Comment 1 Documented in Chart     Comment 2 Notify RN    COMPREHENSIVE METABOLIC PANEL     Status: Abnormal   Collection Time    08/09/13  5:40 AM      Result Value Range   Sodium 131 (*) 135 - 145 mEq/L   Potassium 5.1  3.5 - 5.1 mEq/L   Chloride 98  96 - 112 mEq/L   CO2 22  19 - 32 mEq/L   Glucose, Bld 118 (*) 70 - 99 mg/dL   BUN 13  6 - 23 mg/dL   Creatinine, Ser 1.61  0.50 - 1.10 mg/dL   Calcium 9.9  8.4 - 09.6 mg/dL   Total Protein 6.7  6.0 - 8.3 g/dL   Albumin  3.2 (*) 3.5 - 5.2 g/dL   AST 15  0 - 37 U/L   Comment: HEMOLYSIS AT THIS LEVEL MAY AFFECT RESULT   ALT 5  0 - 35 U/L   Alkaline Phosphatase 60  39 - 117 U/L   Total Bilirubin 0.2 (*) 0.3 - 1.2 mg/dL   GFR calc non Af Amer 62 (*) >90 mL/min   GFR calc Af Amer 71 (*) >90 mL/min   Comment: (NOTE)     The eGFR has been calculated using the CKD EPI equation.     This calculation has not been validated in all clinical situations.     eGFR's persistently <90 mL/min signify possible Chronic Kidney     Disease.  GLUCOSE, CAPILLARY     Status: Abnormal   Collection Time    08/09/13  7:10 AM      Result Value Range   Glucose-Capillary 114 (*) 70 - 99 mg/dL  PROTIME-INR     Status: None   Collection Time    08/09/13  7:47 AM      Result Value Range   Prothrombin Time 12.6  11.6 - 15.2 seconds   INR 0.96  0.00 - 1.49  CBC WITH DIFFERENTIAL     Status: Abnormal   Collection Time    08/09/13  7:47 AM      Result Value Range   WBC 7.0  4.0 - 10.5 K/uL   RBC 3.22 (*) 3.87 - 5.11 MIL/uL   Hemoglobin 10.2 (*) 12.0 - 15.0 g/dL   HCT 04.5 (*) 40.9 - 81.1 %   MCV 93.2  78.0 - 100.0 fL   MCH 31.7  26.0 - 34.0 pg   MCHC 34.0  30.0 - 36.0 g/dL   RDW 91.4  78.2 - 95.6 %   Platelets 381  150 - 400 K/uL   Neutrophils Relative % 62  43 - 77 %   Neutro Abs 4.3  1.7 - 7.7 K/uL   Lymphocytes Relative 27  12 - 46 %   Lymphs Abs 1.9  0.7 - 4.0 K/uL   Monocytes Relative 9  3 - 12 %   Monocytes Absolute 0.6  0.1 - 1.0 K/uL   Eosinophils Relative 2  0 - 5 %   Eosinophils Absolute 0.1  0.0 - 0.7 K/uL   Basophils Relative 0  0 - 1 %   Basophils Absolute 0.0  0.0 - 0.1 K/uL  GLUCOSE, CAPILLARY     Status: Abnormal  Collection Time    08/09/13 11:46 AM      Result Value Range   Glucose-Capillary 115 (*) 70 - 99 mg/dL  GLUCOSE, CAPILLARY     Status: Abnormal   Collection Time    08/09/13  4:27 PM      Result Value Range   Glucose-Capillary 144 (*) 70 - 99 mg/dL  URINALYSIS, ROUTINE W REFLEX  MICROSCOPIC     Status: None   Collection Time    08/09/13  4:41 PM      Result Value Range   Color, Urine YELLOW  YELLOW   APPearance CLEAR  CLEAR   Specific Gravity, Urine 1.006  1.005 - 1.030   pH 8.0  5.0 - 8.0   Glucose, UA NEGATIVE  NEGATIVE mg/dL   Hgb urine dipstick NEGATIVE  NEGATIVE   Bilirubin Urine NEGATIVE  NEGATIVE   Ketones, ur NEGATIVE  NEGATIVE mg/dL   Protein, ur NEGATIVE  NEGATIVE mg/dL   Urobilinogen, UA 0.2  0.0 - 1.0 mg/dL   Nitrite NEGATIVE  NEGATIVE   Leukocytes, UA NEGATIVE  NEGATIVE   Comment: MICROSCOPIC NOT DONE ON URINES WITH NEGATIVE PROTEIN, BLOOD, LEUKOCYTES, NITRITE, OR GLUCOSE <1000 mg/dL.  GLUCOSE, CAPILLARY     Status: Abnormal   Collection Time    08/10/13 12:02 AM      Result Value Range   Glucose-Capillary 110 (*) 70 - 99 mg/dL  GLUCOSE, CAPILLARY     Status: Abnormal   Collection Time    08/10/13  6:21 AM      Result Value Range   Glucose-Capillary 137 (*) 70 - 99 mg/dL  VALPROIC ACID LEVEL     Status: None   Collection Time    08/10/13  6:50 AM      Result Value Range   Valproic Acid Lvl 52.4  50.0 - 100.0 ug/mL    Recent Results (from the past 240 hour(s))  URINE CULTURE     Status: None   Collection Time    08/04/13 12:27 PM      Result Value Range Status   Specimen Description URINE, CLEAN CATCH   Final   Special Requests NONE   Final   Culture  Setup Time     Final   Value: 08/04/2013 17:17     Performed at [facility] Count     Final   Value: NO GROWTH     Performed at [facility]   Culture     Final   Value: NO GROWTH     Performed at [facility]   Report Status 08/05/2013 FINAL   Final    Lipid Panel No results found for this basename: CHOL, TRIG, HDL, CHOLHDL, VLDL, LDLCALC,  in the last 72 hours  Studies/Results: Ct Head (brain) Wo Contrast  08/08/2013   CLINICAL DATA:  Code stroke. Right-sided deficit with gazing to the right.  EXAM: CT HEAD WITHOUT CONTRAST  TECHNIQUE:  Contiguous axial images were obtained from the base of the skull through the vertex without intravenous contrast.  COMPARISON:  08/22/2013  FINDINGS: Skull and Sinuses:No significant abnormality.  Orbits: Gaze deviation to the right.  Brain: No definite large territory infarct. Apparent gray-white differentiation loss in the anterior right frontal lobe appears unchanged from 08/04/2013. No evidence of acute hemorrhage. Global brain atrophy with patchy bilateral cerebral white matter disease, consistent with chronic small vessel ischemia.  These results were called by telephone at the time of interpretation on 08/08/2013 at  10:50 PM to Dr. Leroy Kennedy, who verbally acknowledged these results.  IMPRESSION: 1. Negative for acute hemorrhage. No change to suggest large vessel infarct. 2. Brain atrophy and extensive chronic small vessel ischemia.   Electronically Signed   By: Tiburcio Pea M.D.   On: 08/08/2013 22:51   Mr Angiogram Head Wo Contrast  08/09/2013   CLINICAL DATA:  Evaluate for stroke  EXAM: MRI HEAD WITHOUT CONTRAST  TECHNIQUE: Multiplanar, multiecho pulse sequences of the brain and surrounding structures were obtained without intravenous contrast.  COMPARISON:  Prior CT performed on 08/08/2013 as well as previous MRI from 07/13/2013  FINDINGS: The examination is markedly limited due to patient's inability to tolerate the exam. Has resolved, only DWI and ADC sequences are provided.  There is question of a 6 mm linear focus of restricted diffusion within the right aspect of the pons (series 3, image 11). There is question of corresponding signal dropout on the associated ADC map. Finding is suspicious for acute ischemic infarct. No other abnormal foci of restricted diffusion are seen on this limited examination.  No obvious mass lesion, midline shift, or hydrocephalus identified. Diffuse cerebral atrophy again noted.  IMPRESSION: Limited study. Question 6 mm focus of restricted diffusion within the right  pons as above, suspicious for acute ischemic infarct. Correlation with symptomatology is recommended. In addition, further evaluation with complete MRI/ MRA could be performed for further evaluation as the patient is able to tolerate.   Electronically Signed   By: Rise Mu M.D.   On: 08/09/2013 23:32   Mr Brain Wo Contrast  08/09/2013   CLINICAL DATA:  Evaluate for stroke  EXAM: MRI HEAD WITHOUT CONTRAST  TECHNIQUE: Multiplanar, multiecho pulse sequences of the brain and surrounding structures were obtained without intravenous contrast.  COMPARISON:  Prior CT performed on 08/08/2013 as well as previous MRI from 07/13/2013  FINDINGS: The examination is markedly limited due to patient's inability to tolerate the exam. Has resolved, only DWI and ADC sequences are provided.  There is question of a 6 mm linear focus of restricted diffusion within the right aspect of the pons (series 3, image 11). There is question of corresponding signal dropout on the associated ADC map. Finding is suspicious for acute ischemic infarct. No other abnormal foci of restricted diffusion are seen on this limited examination.  No obvious mass lesion, midline shift, or hydrocephalus identified. Diffuse cerebral atrophy again noted.  IMPRESSION: Limited study. Question 6 mm focus of restricted diffusion within the right pons as above, suspicious for acute ischemic infarct. Correlation with symptomatology is recommended. In addition, further evaluation with complete MRI/ MRA could be performed for further evaluation as the patient is able to tolerate.   Electronically Signed   By: Rise Mu M.D.   On: 08/09/2013 23:32    Medications: I have reviewed the patient's current medications. Scheduled Meds: . atorvastatin  40 mg Oral q1800  . dipyridamole-aspirin  1 capsule Oral BID  . divalproex  250 mg Oral BID  . feeding supplement (ENSURE COMPLETE)  237 mL Oral BID BM  . metFORMIN  500 mg Oral BID WC  .  pantoprazole  40 mg Oral Daily   Continuous Infusions:  PRN Meds:.LORazepam  Assessment/Plan: The patient is an 79 yo woman, history of CVA (with residual spastic R hemiparesis), DM, hyperlipidemia, presenting with altered mental status, left-sided weakness and facial droop, and slurred speech.  Of note, EEG 12/16 showed partial complex seizures, and the patient was recommended to start depakote.  Stroke risk factors of DM, hyperlipidemia, prior CVA, HTN, age.  Dopplers and Echo performed last month, unremarkable.  A1C = 6.5 (11/21), LDL = 44 (11/22).  EEG unremarkable.  Current symptoms may represent right brain stroke vs partial complex seizure.    Recommendations: 1. Continue depakote 250 mg (IV for now since unable to tolerate PO, can convert when able) 2. Continue aggrenox for secondary stroke prevention 3. Continue atorvastatin   LOS: 2 days   Janalyn Harder, PGY3 Pgr. [phone]  08/10/2013  9:03 AM   Patient seen and examined.  MRI of the brain reviewed and shows a possible right pontine infarct but the films are highly motion degraded.  Unclear if patient has failed Plavix in the past.  Clinical course and management discussed.  Necessary edits performed.  I agree with the above.  Assessment and plan of care developed and discussed below.    Recommendations: 1. Will review records for history of failing Plavix in the past.  If no evidence of failing would start Plavix 75mg  daily.  Would continue Aggrenox for now.   2. PT/OT/ Speech therapy   Thana Farr, MD [facility] [phone]  08/10/2013  3:57 PM

## 2013-08-10 NOTE — Progress Notes (Signed)
Pt.'s daughter concerned about pt.'s continued lethargy. D/W Dr. Thad Ranger and per Dr. Thad Ranger no interventions to be done at this time since pt. is otherwise hemodynamically stable. Will continue to monitor.

## 2013-08-10 NOTE — Progress Notes (Signed)
Further chart dissection shows patient was on ASA in 2012 and changed to Aggrenox BID at that time.  Since then she has remained on Aggrenox.  Given this information and recent MRI findings, would recommend she be changed to Plavix 75 mg daily.  If she passes he swallow screen only.   Felicie Morn PA-C [facility] [phone]  08/10/2013, 4:08 PM

## 2013-08-10 NOTE — Progress Notes (Signed)
Pt. Remains sedated from Ativan last pm. Spoke with Dr. Valentina Lucks due to inability to give Depakote and other meds this am and per MD, wait until later when pt. has become alert and then give her the missed meds. Will monitor.

## 2013-08-10 NOTE — Progress Notes (Addendum)
Speech Language Pathology Treatment: Dysphagia  Patient Details Name: Megan [name] MRN: [id number] DOB: 1922-07-28 Today's Date: 08/10/2013 Time: 0820-0839 SLP Time Calculation (min): 19 min  Assessment / Plan / Recommendation Clinical Impression  Pt lethargic today- not adequately awake for po intake.  Per RN, pt had medication for MRI that is likely contributing to lethargy.    Treatment focused on educating Megan [name] to results of clinical swallow evaluation yesterday and current diet/compensation strategy recommendations.  Megan [name] reports pt intake had been less since in hospital around Thanksgiving but had improved a few days prior to this admission.    Encouraged daughter to feed pt when fully alert only, allowing pt to self feed as much as able for neurological input.  Assuring pt swallows by examining laryngeal elevation also reviewed with daughter. Changes in gustatory input with age/dementia progression reviewed.  Megan [name] reports they feed pt when she is accepting, assure her mouth is clear before giving more and give liquids to help aid swallow.  Family appears to manage pt's swallow function well, however given her current neuro/mental status change, SLP will follow up to assure tolerance and modify as indicated.    HPI HPI: 79 y.o. female brought in with altered mental status, left sided weakness, left face droop, and slurred speech. Vascular dementia at baseline. Per daughter, pt had previous CVA in 2012 resulting in right HP and dysphagia.  Pt now with concern for possible CVA vs partial complex seizures. For MRI today per daughter.  Bedside swallow evaluation ordered.    Pertinent Vitals Afebrile decreased Note results of MRI with ? Acute ischemia at right pons   SLP Plan  Continue with current plan of care    Recommendations Diet recommendations: Dysphagia 1 (puree);Thin liquid (when fully alert only) Medication Administration: Crushed with puree Supervision: Patient able to self  feed;Staff to assist with self feeding;Full supervision/cueing for compensatory strategies Compensations: Slow rate;Small sips/bites;Check for pocketing Postural Changes and/or Swallow Maneuvers: Seated upright 90 degrees;Upright 30-60 min after meal              Oral Care Recommendations: Oral care BID Follow up Recommendations: 24 hour supervision/assistance Plan: Continue with current plan of care    GO     Megan Burnet, MS [facility] SLP [phone]

## 2013-08-10 NOTE — Procedures (Signed)
ELECTROENCEPHALOGRAM REPORT   Patient: Megan [name]       Room #: 1O10 EEG No. ID: [id number] Age: 79 y.o.        Sex: female Referring Physician: Allena Katz Report Date:  08/09/2013        Interpreting Physician: Thana Farr D  History: [name] is an 79 y.o. female with altered mental status and weakness  Medications:  Scheduled: . atorvastatin  40 mg Oral q1800  . dipyridamole-aspirin  1 capsule Oral BID  . divalproex  250 mg Oral BID  . feeding supplement (ENSURE COMPLETE)  237 mL Oral BID BM  . metFORMIN  500 mg Oral BID WC  . pantoprazole  40 mg Oral Daily    Conditions of Recording:  This is a 16 channel EEG carried out with the patient in the drowsy state.  Description:  The background activity consists of a poorly organized low to moderate voltage theta activity that is diffusely distributed throughout.  The posterior background rhythm reaches a maximum of 7 Hz theta.  There is a notable lack of faster rhythms, particularly in the anterior regions.  Background activity is continuous.   There is no evidence of stage II sleep. Hyperventilation and intermittent photic stimulation were not performed.  IMPRESSION: This EEG is characterized by slowing which is consistent with normal drowse.  Can not rule out the possibility of slowing related to general cerebral disturbance such as a metabolic encephalopathy.  Clinical correlation recommended.  No epileptiform activity is noted.     Thana Farr, MD [facility] [phone] 08/10/2013, 7:20 AM

## 2013-08-10 NOTE — Progress Notes (Signed)
Subjective: Slept all night after MRI.  Much more awake during day yesterday  Objective: Vital signs in last 24 hours: Temp:  [97.3 F (36.3 C)-98.4 F (36.9 C)] 97.5 F (36.4 C) (12/19 0610) Pulse Rate:  [52-107] 100 (12/19 0610) Resp:  [20] 20 (12/19 0610) BP: (90-166)/(44-76) 103/44 mmHg (12/19 0610) SpO2:  [88 %-100 %] 100 % (12/19 0610) Weight change:  Last BM Date: 08/08/13  Intake/Output from previous day:   Intake/Output this shift:    Resp: clear to auscultation bilaterally Cardio: regular rate and rhythm, S1, S2 normal, no murmur, click, rub or gallop  Lab Results:  Recent Labs  08/08/13 2228 08/08/13 2239 08/09/13 0747  WBC 6.3  --  7.0  HGB 10.1* 11.2* 10.2*  HCT 30.6* 33.0* 30.0*  PLT 302  --  381   BMET  Recent Labs  08/08/13 2228 08/08/13 2239 08/09/13 0540  NA 130* 134* 131*  K 5.0 4.8 5.1  CL 95* 99 98  CO2 21  --  22  GLUCOSE 140* 141* 118*  BUN 15 15 13   CREATININE 0.91 1.10 0.82  CALCIUM 9.8  --  9.9    Studies/Results: Ct Head (brain) Wo Contrast  08/08/2013   CLINICAL DATA:  Code stroke. Right-sided deficit with gazing to the right.  EXAM: CT HEAD WITHOUT CONTRAST  TECHNIQUE: Contiguous axial images were obtained from the base of the skull through the vertex without intravenous contrast.  COMPARISON:  08/22/2013  FINDINGS: Skull and Sinuses:No significant abnormality.  Orbits: Gaze deviation to the right.  Brain: No definite large territory infarct. Apparent gray-white differentiation loss in the anterior right frontal lobe appears unchanged from 08/04/2013. No evidence of acute hemorrhage. Global brain atrophy with patchy bilateral cerebral white matter disease, consistent with chronic small vessel ischemia.  These results were called by telephone at the time of interpretation on 08/08/2013 at 10:50 PM to Dr. Leroy Kennedy, who verbally acknowledged these results.  IMPRESSION: 1. Negative for acute hemorrhage. No change to suggest large vessel  infarct. 2. Brain atrophy and extensive chronic small vessel ischemia.   Electronically Signed   By: Tiburcio Pea M.D.   On: 08/08/2013 22:51   Mr Angiogram Head Wo Contrast  08/09/2013   CLINICAL DATA:  Evaluate for stroke  EXAM: MRI HEAD WITHOUT CONTRAST  TECHNIQUE: Multiplanar, multiecho pulse sequences of the brain and surrounding structures were obtained without intravenous contrast.  COMPARISON:  Prior CT performed on 08/08/2013 as well as previous MRI from 07/13/2013  FINDINGS: The examination is markedly limited due to patient's inability to tolerate the exam. Has resolved, only DWI and ADC sequences are provided.  There is question of a 6 mm linear focus of restricted diffusion within the right aspect of the pons (series 3, image 11). There is question of corresponding signal dropout on the associated ADC map. Finding is suspicious for acute ischemic infarct. No other abnormal foci of restricted diffusion are seen on this limited examination.  No obvious mass lesion, midline shift, or hydrocephalus identified. Diffuse cerebral atrophy again noted.  IMPRESSION: Limited study. Question 6 mm focus of restricted diffusion within the right pons as above, suspicious for acute ischemic infarct. Correlation with symptomatology is recommended. In addition, further evaluation with complete MRI/ MRA could be performed for further evaluation as the patient is able to tolerate.   Electronically Signed   By: Rise Mu M.D.   On: 08/09/2013 23:32   Mr Brain Wo Contrast  08/09/2013   CLINICAL DATA:  Evaluate  for stroke  EXAM: MRI HEAD WITHOUT CONTRAST  TECHNIQUE: Multiplanar, multiecho pulse sequences of the brain and surrounding structures were obtained without intravenous contrast.  COMPARISON:  Prior CT performed on 08/08/2013 as well as previous MRI from 07/13/2013  FINDINGS: The examination is markedly limited due to patient's inability to tolerate the exam. Has resolved, only DWI and ADC  sequences are provided.  There is question of a 6 mm linear focus of restricted diffusion within the right aspect of the pons (series 3, image 11). There is question of corresponding signal dropout on the associated ADC map. Finding is suspicious for acute ischemic infarct. No other abnormal foci of restricted diffusion are seen on this limited examination.  No obvious mass lesion, midline shift, or hydrocephalus identified. Diffuse cerebral atrophy again noted.  IMPRESSION: Limited study. Question 6 mm focus of restricted diffusion within the right pons as above, suspicious for acute ischemic infarct. Correlation with symptomatology is recommended. In addition, further evaluation with complete MRI/ MRA could be performed for further evaluation as the patient is able to tolerate.   Electronically Signed   By: Rise Mu M.D.   On: 08/09/2013 23:32    Medications: I have reviewed the patient's current medications.  Assessment/Plan: Principal Problem:  Altered mental status episode of decreased mentation and L sided weakness, suggesting possible partial complex seizures, depakote started.  MRI suggests possible new cva in R pons.  EEG pending Active Problems:  Diabetes mellitus controlled d/c ssi and restart metformin HTN (hypertension) has not required medication Oropharyngael dysphagia, speech therapy noted, D1 diet with thin liquids. Severe protein malnutrition, appreciated nutrition input Disposition, hope to discharge next 24-48 hours with home health   LOS: 2 days   Megan [name] [name] 08/10/2013, 6:50 AM

## 2013-08-11 DIAGNOSIS — I6350 Cerebral infarction due to unspecified occlusion or stenosis of unspecified cerebral artery: Principal | ICD-10-CM

## 2013-08-11 LAB — GLUCOSE, CAPILLARY
Glucose-Capillary: 115 mg/dL — ABNORMAL HIGH (ref 70–99)
Glucose-Capillary: 108 mg/dL — ABNORMAL HIGH (ref 70–99)
Glucose-Capillary: 92 mg/dL (ref 70–99)
Glucose-Capillary: 227 mg/dL — ABNORMAL HIGH (ref 70–99)

## 2013-08-11 MED ORDER — CLOPIDOGREL BISULFATE 75 MG PO TABS
75.0000 mg | ORAL_TABLET | Freq: Every day | ORAL | Status: DC
  Administered 2013-08-12 – 2013-08-15 (×4): 75 mg via ORAL
  Filled 2013-08-11 (×5): qty 1

## 2013-08-11 NOTE — Progress Notes (Signed)
Patient fast asleep at the moment. Spoke to daughter in the room, and if/when patient wakes up later, we can assess and see if she is awake and alert enough to try her food then. Will cont to monitor

## 2013-08-11 NOTE — Progress Notes (Signed)
No po medications given so far this morning as patient is still too lethargic to safely swallow

## 2013-08-11 NOTE — Progress Notes (Signed)
After her bath, patient was alert enough to attempt lunch. Was able to eat about 1/4 of her meal, mouth cleaned out thoroughly with suction afterwards. Will cont to monitor

## 2013-08-11 NOTE — Progress Notes (Signed)
Subjective: Patient remains lethargic but improved from yesterday.  Tolerating Depakote.  Last level therapeutic.    Objective: Current vital signs: BP 135/90  Pulse 102  Temp(Src) 98 F (36.7 C) (Oral)  Resp 18  Ht 5\' 1"  (1.549 m)  Wt 53.524 kg (118 lb)  BMI 22.31 kg/m2  SpO2 100% Vital signs in last 24 hours: Temp:  [97.8 F (36.6 C)-98.6 F (37 C)] 98 F (36.7 C) (12/20 0959) Pulse Rate:  [90-104] 102 (12/20 0959) Resp:  [16-18] 18 (12/20 0959) BP: (109-164)/(50-90) 135/90 mmHg (12/20 0959) SpO2:  [98 %-100 %] 100 % (12/20 0959)  Intake/Output from previous day:   Intake/Output this shift:   Nutritional status: Dysphagia  Neurologic Exam: Mental Status:  Lying in bed, somnolent, arousable to touch.  Speech fluent. Cranial Nerves:  II - patient blinks to bilateral confrontation, PERRL  III/IV/VI - EOM's grossly intact with patient scanning the room often V/VII - left-sided facial droop  VIII - difficulty hearing bilaterally (chronic)  IX,X - palate elevates bilaterally  XI: unable to fully participate  XII: tongue strength normal  Motor: increased tone of right arm and leg (chronic, from old CVA). Moves L arm and leg spontaneously but unable to fully participate in strength testing  Sensory: unable to assess  Deep Tendon Reflexes: 1+ bilaterally  Cerebellar: unable to assess  Carotid auscultation: No bruit   Lab Results: Basic Metabolic Panel:  Recent Labs Lab 08/08/13 2228 08/08/13 2239 08/09/13 0540  NA 130* 134* 131*  K 5.0 4.8 5.1  CL 95* 99 98  CO2 21  --  22  GLUCOSE 140* 141* 118*  BUN 15 15 13   CREATININE 0.91 1.10 0.82  CALCIUM 9.8  --  9.9    Liver Function Tests:  Recent Labs Lab 08/08/13 2228 08/09/13 0540  AST 18 15  ALT 7 5  ALKPHOS 65 60  BILITOT 0.2* 0.2*  PROT 7.6 6.7  ALBUMIN 3.5 3.2*   No results found for this basename: LIPASE, AMYLASE,  in the last 168 hours No results found for this basename: AMMONIA,  in the last  168 hours  CBC:  Recent Labs Lab 08/08/13 2228 08/08/13 2239 08/09/13 0747  WBC 6.3  --  7.0  NEUTROABS 3.2  --  4.3  HGB 10.1* 11.2* 10.2*  HCT 30.6* 33.0* 30.0*  MCV 94.2  --  93.2  PLT 302  --  381    Cardiac Enzymes:  Recent Labs Lab 08/08/13 2228  TROPONINI <0.30    Lipid Panel: No results found for this basename: CHOL, TRIG, HDL, CHOLHDL, VLDL, LDLCALC,  in the last 168 hours  CBG:  Recent Labs Lab 08/10/13 2032 08/11/13 0032 08/11/13 0418 08/11/13 0809 08/11/13 1150  GLUCAP 99 114* 115* 108* 95    Microbiology: Results for orders placed during the hospital encounter of 08/04/13  URINE CULTURE     Status: None   Collection Time    08/04/13 12:27 PM      Result Value Range Status   Specimen Description URINE, CLEAN CATCH   Final   Special Requests NONE   Final   Culture  Setup Time     Final   Value: 08/04/2013 17:17     Performed at [facility] Count     Final   Value: NO GROWTH     Performed at [facility]   Culture     Final   Value: NO GROWTH  Performed at [facility]   Report Status 08/05/2013 FINAL   Final    Coagulation Studies:  Recent Labs  08/08/13 2228 08/09/13 0747  LABPROT 13.0 12.6  INR 1.00 0.96    Imaging: Mr Angiogram Head Wo Contrast  08/09/2013   CLINICAL DATA:  Evaluate for stroke  EXAM: MRI HEAD WITHOUT CONTRAST  TECHNIQUE: Multiplanar, multiecho pulse sequences of the brain and surrounding structures were obtained without intravenous contrast.  COMPARISON:  Prior CT performed on 08/08/2013 as well as previous MRI from 07/13/2013  FINDINGS: The examination is markedly limited due to patient's inability to tolerate the exam. Has resolved, only DWI and ADC sequences are provided.  There is question of a 6 mm linear focus of restricted diffusion within the right aspect of the pons (series 3, image 11). There is question of corresponding signal dropout on the associated ADC map.  Finding is suspicious for acute ischemic infarct. No other abnormal foci of restricted diffusion are seen on this limited examination.  No obvious mass lesion, midline shift, or hydrocephalus identified. Diffuse cerebral atrophy again noted.  IMPRESSION: Limited study. Question 6 mm focus of restricted diffusion within the right pons as above, suspicious for acute ischemic infarct. Correlation with symptomatology is recommended. In addition, further evaluation with complete MRI/ MRA could be performed for further evaluation as the patient is able to tolerate.   Electronically Signed   By: Rise Mu M.D.   On: 08/09/2013 23:32   Mr Brain Wo Contrast  08/09/2013   CLINICAL DATA:  Evaluate for stroke  EXAM: MRI HEAD WITHOUT CONTRAST  TECHNIQUE: Multiplanar, multiecho pulse sequences of the brain and surrounding structures were obtained without intravenous contrast.  COMPARISON:  Prior CT performed on 08/08/2013 as well as previous MRI from 07/13/2013  FINDINGS: The examination is markedly limited due to patient's inability to tolerate the exam. Has resolved, only DWI and ADC sequences are provided.  There is question of a 6 mm linear focus of restricted diffusion within the right aspect of the pons (series 3, image 11). There is question of corresponding signal dropout on the associated ADC map. Finding is suspicious for acute ischemic infarct. No other abnormal foci of restricted diffusion are seen on this limited examination.  No obvious mass lesion, midline shift, or hydrocephalus identified. Diffuse cerebral atrophy again noted.  IMPRESSION: Limited study. Question 6 mm focus of restricted diffusion within the right pons as above, suspicious for acute ischemic infarct. Correlation with symptomatology is recommended. In addition, further evaluation with complete MRI/ MRA could be performed for further evaluation as the patient is able to tolerate.   Electronically Signed   By: Rise Mu  M.D.   On: 08/09/2013 23:32    Medications:  I have reviewed the patient's current medications. Scheduled: . atorvastatin  40 mg Oral q1800  . [START ON 08/12/2013] clopidogrel  75 mg Oral Q breakfast  . feeding supplement (ENSURE COMPLETE)  237 mL Oral BID BM  . metFORMIN  500 mg Oral BID WC  . pantoprazole  40 mg Oral Daily  . valproate sodium  250 mg Intravenous Q12H    Assessment/Plan: Patient remains lethargic but improved.  Likely slowing recovering from the Ativan.  Plavix to be started once able to take po safely. No seizures noted.    Recommendations: 1.  Will continue to follow with you.  2.  Continue therapy      LOS: 3 days   Thana Farr, MD [facility] [phone] 08/11/2013  1:12 PM

## 2013-08-11 NOTE — Progress Notes (Signed)
Subjective: More awake this am  Objective: Vital signs in last 24 hours: Temp:  [97.5 F (36.4 C)-98.6 F (37 C)] 97.8 F (36.6 C) (12/20 0523) Pulse Rate:  [90-104] 97 (12/20 0523) Resp:  [16-18] 18 (12/20 0523) BP: (109-164)/(50-83) 109/50 mmHg (12/20 0523) SpO2:  [98 %-100 %] 100 % (12/20 0523) Weight change:  Last BM Date: 08/09/13  Intake/Output from previous day:   Intake/Output this shift:    General appearance: arousable, answers question, but drowsy Resp: clear to auscultation bilaterally Cardio: regular rate and rhythm, S1, S2 normal, no murmur, click, rub or gallop  Lab Results:  Recent Labs  08/08/13 2228 08/08/13 2239 08/09/13 0747  WBC 6.3  --  7.0  HGB 10.1* 11.2* 10.2*  HCT 30.6* 33.0* 30.0*  PLT 302  --  381   BMET  Recent Labs  08/08/13 2228 08/08/13 2239 08/09/13 0540  NA 130* 134* 131*  K 5.0 4.8 5.1  CL 95* 99 98  CO2 21  --  22  GLUCOSE 140* 141* 118*  BUN 15 15 13   CREATININE 0.91 1.10 0.82  CALCIUM 9.8  --  9.9    Studies/Results: Mr Angiogram Head Wo Contrast  08/09/2013   CLINICAL DATA:  Evaluate for stroke  EXAM: MRI HEAD WITHOUT CONTRAST  TECHNIQUE: Multiplanar, multiecho pulse sequences of the brain and surrounding structures were obtained without intravenous contrast.  COMPARISON:  Prior CT performed on 08/08/2013 as well as previous MRI from 07/13/2013  FINDINGS: The examination is markedly limited due to patient's inability to tolerate the exam. Has resolved, only DWI and ADC sequences are provided.  There is question of a 6 mm linear focus of restricted diffusion within the right aspect of the pons (series 3, image 11). There is question of corresponding signal dropout on the associated ADC map. Finding is suspicious for acute ischemic infarct. No other abnormal foci of restricted diffusion are seen on this limited examination.  No obvious mass lesion, midline shift, or hydrocephalus identified. Diffuse cerebral atrophy again  noted.  IMPRESSION: Limited study. Question 6 mm focus of restricted diffusion within the right pons as above, suspicious for acute ischemic infarct. Correlation with symptomatology is recommended. In addition, further evaluation with complete MRI/ MRA could be performed for further evaluation as the patient is able to tolerate.   Electronically Signed   By: Rise Mu M.D.   On: 08/09/2013 23:32   Mr Brain Wo Contrast  08/09/2013   CLINICAL DATA:  Evaluate for stroke  EXAM: MRI HEAD WITHOUT CONTRAST  TECHNIQUE: Multiplanar, multiecho pulse sequences of the brain and surrounding structures were obtained without intravenous contrast.  COMPARISON:  Prior CT performed on 08/08/2013 as well as previous MRI from 07/13/2013  FINDINGS: The examination is markedly limited due to patient's inability to tolerate the exam. Has resolved, only DWI and ADC sequences are provided.  There is question of a 6 mm linear focus of restricted diffusion within the right aspect of the pons (series 3, image 11). There is question of corresponding signal dropout on the associated ADC map. Finding is suspicious for acute ischemic infarct. No other abnormal foci of restricted diffusion are seen on this limited examination.  No obvious mass lesion, midline shift, or hydrocephalus identified. Diffuse cerebral atrophy again noted.  IMPRESSION: Limited study. Question 6 mm focus of restricted diffusion within the right pons as above, suspicious for acute ischemic infarct. Correlation with symptomatology is recommended. In addition, further evaluation with complete MRI/ MRA could be performed  for further evaluation as the patient is able to tolerate.   Electronically Signed   By: Rise Mu M.D.   On: 08/09/2013 23:32    Medications: I have reviewed the patient's current medications.  Assessment/Plan: Principal Problem:  Altered mental status episode of decreased mentation and L sided weakness, suggesting possible  partial complex seizures, depakote started. MRI suggests possible new cva in R pons. EEG pending.  Neurology has recommended D/C aggrenox and start plavix  Active Problems:  Lethargy over last 24 hours, some improvement this am, likely secondary to 2 doses ativan given at time of MRI Diabetes mellitus controlled on metformin HTN (hypertension) has not required medication  Oropharyngael dysphagia, speech therapy noted, D1 diet with thin liquids.  Severe protein malnutrition, appreciated nutrition input  Disposition, discharge when mental status back to baseline   LOS: 3 days   Megan [name] 08/11/2013, 9:51 AM

## 2013-08-12 LAB — BASIC METABOLIC PANEL
CO2: 22 mEq/L (ref 19–32)
Calcium: 8.9 mg/dL (ref 8.4–10.5)
Chloride: 105 mEq/L (ref 96–112)
GFR calc non Af Amer: 72 mL/min — ABNORMAL LOW (ref >90)
Glucose, Bld: 92 mg/dL (ref 70–99)
Potassium: 3.9 mEq/L (ref 3.5–5.1)
Sodium: 140 mEq/L (ref 135–145)

## 2013-08-12 LAB — GLUCOSE, CAPILLARY
Glucose-Capillary: 91 mg/dL (ref 70–99)
Glucose-Capillary: 138 mg/dL — ABNORMAL HIGH (ref 70–99)

## 2013-08-12 MED ORDER — BOOST PLUS PO LIQD
237.0000 mL | Freq: Two times a day (BID) | ORAL | Status: DC
  Administered 2013-08-12 – 2013-08-15 (×7): 237 mL via ORAL
  Filled 2013-08-12 (×10): qty 237

## 2013-08-12 MED ORDER — VALPROATE SODIUM 500 MG/5ML IV SOLN
125.0000 mg | Freq: Two times a day (BID) | INTRAVENOUS | Status: DC
  Administered 2013-08-12 – 2013-08-14 (×4): 125 mg via INTRAVENOUS
  Filled 2013-08-12 (×6): qty 1.25

## 2013-08-12 NOTE — Progress Notes (Addendum)
Subjective: Patient remains lethargic per family although she does awaken.  She is not as verbal as she was prior to this admission.   Objective: Current vital signs: BP 143/61  Pulse 93  Temp(Src) 97.6 F (36.4 C) (Oral)  Resp 16  Ht 5\' 1"  (1.549 m)  Wt 53.524 kg (118 lb)  BMI 22.31 kg/m2  SpO2 100% Vital signs in last 24 hours: Temp:  [97.5 F (36.4 C)-98.3 F (36.8 C)] 97.6 F (36.4 C) (12/21 1429) Pulse Rate:  [78-103] 93 (12/21 1429) Resp:  [16-18] 16 (12/21 1429) BP: (109-143)/(51-85) 143/61 mmHg (12/21 1429) SpO2:  [99 %-100 %] 100 % (12/21 1429)  Intake/Output from previous day: 12/20 0701 - 12/21 0700 In: 120 [P.O.:120] Out: -  Intake/Output this shift:   Nutritional status: Dysphagia  Neurologic Exam: Mental Status:  Lying in bed, somnolent, arousable to touch. Speech minimal. Follows simple commands Cranial Nerves:  II - patient blinks to bilateral confrontation, PERRL  III/IV/VI - EOM's grossly intact with patient scanning the room often  V/VII - left-sided facial droop  VIII - difficulty hearing bilaterally (chronic)  IX,X - palate elevates bilaterally  XI: unable to fully participate  XII: tongue strength normal  Motor: increased tone of right arm and leg (chronic, from old CVA). Moves L arm and leg spontaneously but unable to fully participate in strength testing  Deep Tendon Reflexes: 1+ bilaterally  Cerebellar: unable to assess  Carotid auscultation: No bruit   Lab Results: Basic Metabolic Panel:  Recent Labs Lab 08/08/13 2228 08/08/13 2239 08/09/13 0540 08/12/13 0610  NA 130* 134* 131* 140  K 5.0 4.8 5.1 3.9  CL 95* 99 98 105  CO2 21  --  22 22  GLUCOSE 140* 141* 118* 92  BUN 15 15 13 13   CREATININE 0.91 1.10 0.82 0.78  CALCIUM 9.8  --  9.9 8.9    Liver Function Tests:  Recent Labs Lab 08/08/13 2228 08/09/13 0540  AST 18 15  ALT 7 5  ALKPHOS 65 60  BILITOT 0.2* 0.2*  PROT 7.6 6.7  ALBUMIN 3.5 3.2*   No results found  for this basename: LIPASE, AMYLASE,  in the last 168 hours No results found for this basename: AMMONIA,  in the last 168 hours  CBC:  Recent Labs Lab 08/08/13 2228 08/08/13 2239 08/09/13 0747  WBC 6.3  --  7.0  NEUTROABS 3.2  --  4.3  HGB 10.1* 11.2* 10.2*  HCT 30.6* 33.0* 30.0*  MCV 94.2  --  93.2  PLT 302  --  381    Cardiac Enzymes:  Recent Labs Lab 08/08/13 2228  TROPONINI <0.30    Lipid Panel: No results found for this basename: CHOL, TRIG, HDL, CHOLHDL, VLDL, LDLCALC,  in the last 168 hours  CBG:  Recent Labs Lab 08/11/13 2002 08/11/13 2355 08/12/13 0413 08/12/13 0948 08/12/13 1143  GLUCAP 227* 137* 91 102* 110*    Microbiology: Results for orders placed during the hospital encounter of 08/04/13  URINE CULTURE     Status: None   Collection Time    08/04/13 12:27 PM      Result Value Range Status   Specimen Description URINE, CLEAN CATCH   Final   Special Requests NONE   Final   Culture  Setup Time     Final   Value: 08/04/2013 17:17     Performed at [facility] Count     Final   Value: NO GROWTH  Performed at [facility]     Final   Value: NO GROWTH     Performed at [facility]   Report Status 08/05/2013 FINAL   Final    Coagulation Studies: No results found for this basename: LABPROT, INR,  in the last 72 hours  Imaging: No results found.  Medications:  I have reviewed the patient's current medications. Scheduled: . atorvastatin  40 mg Oral q1800  . clopidogrel  75 mg Oral Q breakfast  . lactose free nutrition  237 mL Oral BID BM  . metFORMIN  500 mg Oral BID WC  . pantoprazole  40 mg Oral Daily  . valproate sodium  125 mg Intravenous Q12H    Assessment/Plan: Patient has had no significant improvement in lethargy despite being farther removed from the administration of the Ativan.  No seizure activity noted.  Questionable medication side effects versus new cerebral event.     Recommendations: 1. Depakote level, LFT's in AM 2. Decrease Depacon to 125mg  BID 3. Repeat head CT   LOS: 4 days   Thana Farr, MD [facility] [phone] 08/12/2013  3:39 PM

## 2013-08-12 NOTE — Progress Notes (Signed)
Subjective: More awake yesterday, somnolent this AM  Objective: Vital signs in last 24 hours: Temp:  [97.5 F (36.4 C)-98.3 F (36.8 C)] 98.2 F (36.8 C) (12/21 0547) Pulse Rate:  [85-103] 86 (12/21 0547) Resp:  [16-18] 18 (12/21 0547) BP: (109-139)/(51-90) 136/80 mmHg (12/21 0547) SpO2:  [99 %-100 %] 100 % (12/21 0547) Weight change:  Last BM Date: 08/09/13  Intake/Output from previous day: 12/20 0701 - 12/21 0700 In: 120 [P.O.:120] Out: -  Intake/Output this shift:    General appearance: slowed mentation Resp: clear to auscultation bilaterally Cardio: regular rate and rhythm, S1, S2 normal, no murmur, click, rub or gallop Extremities: extremities normal, atraumatic, no cyanosis or edema  Lab Results: No results found for this basename: WBC, HGB, HCT, PLT,  in the last 72 hours BMET  Recent Labs  08/12/13 0610  NA 140  K 3.9  CL 105  CO2 22  GLUCOSE 92  BUN 13  CREATININE 0.78  CALCIUM 8.9    Studies/Results: No results found.  Medications: I have reviewed the patient's current medications.  Assessment/Plan: Principal Problem:  Altered mental status episode of decreased mentation and L sided weakness, suggesting possible partial complex seizures, depakote started. MRI suggests possible new cva in R pons. 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 Neurology has recommended D/C aggrenox and start plavix  Active Problems:  Lethargy over last 48 hours, some improvement yesterday, but decreased mentation today, over 48 hours since last ativan.  effect of partial complex seizures vs new CVA vs depakote side effect? Diabetes mellitus controlled on metformin  HTN (hypertension) has not required medication  Oropharyngael dysphagia, speech therapy noted, D1 diet with thin liquids. Add Boost per family request Severe protein malnutrition, appreciated nutrition input  Disposition, discharge when mental status back to baseline      LOS: 4 days   Megan [name]  Megan [name] 08/12/2013, 8:56 AM

## 2013-08-12 NOTE — Consult Note (Signed)
WOC wound consult note Reason for Consult: Consult requested for buttocks.  Family member at bedside states she has had these wounds for about a month and is receiving home health assistance at home.  She assessed wounds during consult and states they have greatly improved. They have been using a hydrocolloid. Wound type: 2 areas of stage 2 wounds Pressure Ulcer POA: Yes Measurement: .8X.8X.1cm to left and right buttocks Wound bed: both sites 100% pink and dry, almost healed. Drainage (amount, consistency, odor) No odor or drainage Periwound:intact skin surrounding. Dressing procedure/placement/frequency: Foam dressing to protect and promote healing.   Discussed pressure ulcer etiology, topical treatment, and preventive measures with patient.  Discussed plan of care with daughter. She appears to have a good understanding of this subject. Please re-consult if further assistance is needed.  Thank-you,  Cammie Mcgee MSN, RN, CWOCN, [place], CNS [phone]

## 2013-08-13 ENCOUNTER — Inpatient Hospital Stay (HOSPITAL_COMMUNITY): Payer: Medicare Other

## 2013-08-13 LAB — HEPATIC FUNCTION PANEL
ALT: <5 U/L (ref 0–35)
AST: 11 U/L (ref 0–37)
Albumin: 2.6 g/dL — ABNORMAL LOW (ref 3.5–5.2)
Alkaline Phosphatase: 56 U/L (ref 39–117)
Bilirubin, Direct: <0.1 mg/dL (ref 0.0–0.3)
Total Bilirubin: 0.2 mg/dL — ABNORMAL LOW (ref 0.3–1.2)
Total Protein: 6.1 g/dL (ref 6.0–8.3)

## 2013-08-13 LAB — GLUCOSE, CAPILLARY
Glucose-Capillary: 120 mg/dL — ABNORMAL HIGH (ref 70–99)
Glucose-Capillary: 151 mg/dL — ABNORMAL HIGH (ref 70–99)

## 2013-08-13 LAB — VALPROIC ACID LEVEL: Valproic Acid Lvl: 56.8 ug/mL (ref 50.0–100.0)

## 2013-08-13 MED ORDER — ATORVASTATIN CALCIUM 10 MG PO TABS
10.0000 mg | ORAL_TABLET | Freq: Every day | ORAL | Status: DC
  Administered 2013-08-13: 10 mg via ORAL
  Filled 2013-08-13: qty 1

## 2013-08-13 MED ORDER — ROSUVASTATIN CALCIUM 5 MG PO TABS
5.0000 mg | ORAL_TABLET | Freq: Every day | ORAL | Status: DC
  Administered 2013-08-14: 5 mg via ORAL
  Filled 2013-08-13 (×2): qty 1

## 2013-08-13 NOTE — Progress Notes (Signed)
Occupational Therapy Treatment Patient Details Name: Megan [name] MRN: [id number] DOB: [birth year] Today's Date: 08/13/2013 Time: 9528-4132 ([id number]) OT Time Calculation (min): 37 min  OT Assessment / Plan / Recommendation  History of present illness pt presents with Encephalopathy and r/o CVA.  pt with hx of CVA.     OT comments  Family present during session and agree to SNF search. SW Lupita Leash called and informed that family will be present today after 15:30PM and would like to hear possible options. CM Courtney notified family would like to know all options from home. Pt's family would like to take her home but realized by assisting with OT session, can not provide the total +2 (A) needed for home and do not have room for [facility] lift.   Follow Up Recommendations  SNF;Supervision/Assistance - 24 hour    Barriers to Discharge       Equipment Recommendations  None recommended by OT    Recommendations for Other Services    Frequency Min 2X/week   Progress towards OT Goals Progress towards OT goals: Progressing toward goals  Plan Discharge plan needs to be updated    Precautions / Restrictions Precautions Precautions: Fall   Pertinent Vitals/Pain None reported Reports feeling "cold"  Heated blankets provided    ADL  Grooming: Wash/dry face;Minimal assistance Where Assessed - Grooming: Supported sitting Toilet Transfer: Maximal assistance Toilet Transfer Method: Surveyor, minerals: Raised toilet seat with arms (or 3-in-1 over toilet) Toileting - Clothing Manipulation and Hygiene: Maximal assistance Where Assessed - Engineer, mining and Hygiene: Sit to stand from 3-in-1 or toilet Equipment Used: Rolling walker;Gait belt Transfers/Ambulation Related to ADLs: pt sit<>Stand with strong Rt lean and demonstrates pushers syndrome  ADL Comments: Spoke in detail to eldest daughter in detail about PTA. During transfer portion of session family (A)ing  with transfer. Family reports great decline in mobility compared to baseline. Family very tearful about possibility of patient not being home for Christmas. Family would like to take patient home but based on session do not feel they can manage this level of care. Family educated on the use of a hoyer and they report no room in the house for this equipment. Family requesting CIR possible consult. Family educated that current level of participation she could not tolerate 3 hours of therapy. Pt with slurred speech and difficult to understand patients needs at times. Pt complete x4 sit<>stand during session. pt with NWB on Rt LE and pushign with LT UE. pt will require two person (A) for d/c home    OT Diagnosis:    OT Problem List:   OT Treatment Interventions:     OT Goals(current goals can now be found in the care plan section) Acute Rehab OT Goals Patient Stated Goal: Per daughter to go home.   OT Goal Formulation: With patient/family Time For Goal Achievement: 08/23/13 Potential to Achieve Goals: Fair ADL Goals Pt Will Transfer to Toilet: with min assist;bedside commode Pt Will Perform Toileting - Clothing Manipulation and hygiene: with mod assist;sit to/from stand  Visit Information  Last OT Received On: 08/13/13 Assistance Needed: +2 History of Present Illness: pt presents with Encephalopathy and r/o CVA.  pt with hx of CVA.      Subjective Data      Prior Functioning       Cognition  Cognition Arousal/Alertness: Awake/alert Behavior During Therapy: Flat affect Overall Cognitive Status: Difficult to assess Difficult to assess due to: Hard of hearing/deaf    Mobility  Bed  Mobility Bed Mobility: Not assessed Supine to Sit: 3: Mod assist;HOB flat Sitting - Scoot to Edge of Bed: 2: Max assist Details for Bed Mobility Assistance: A for LEs and trunk elevation off of bed. Use of chuck pad to scoot EOB Transfers Transfers: Sit to Stand;Stand to Sit Sit to Stand: 1: +1 Total  assist;With upper extremity assist;From bed Sit to Stand: Patient Percentage: 60% Stand to Sit: 1: +1 Total assist;With upper extremity assist;To chair/3-in-1 Stand to Sit: Patient Percentage: 60% Details for Transfer Assistance: daughters present and helping manage chair to prevent chair from sliding. pt with posterior lean and knee extension. pt with bIL LE sliding with grip socks. pt required blocking of bil LE    Exercises      Balance Static Sitting Balance Static Sitting - Balance Support: Bilateral upper extremity supported;Feet supported Static Sitting - Level of Assistance: 4: Min assist   End of Session OT - End of Session Activity Tolerance: Patient limited by fatigue Patient left: in chair;with call bell/phone within reach;with family/visitor present Nurse Communication: Mobility status;Need for lift equipment;Precautions  GO     Harolyn Rutherford 08/13/2013, 3:06 PM Pager: [phone]

## 2013-08-13 NOTE — Progress Notes (Signed)
Stroke Team Progress Note  HISTORY Megan [name] is an 79 y.o. female with a past medical history significant for HTN, hyperlipidemia, DM, stroke x 2 with residual right hemiparesis, possible vascular dementia, seizures, brought to [facility] ED by ambulance as a code stroke due to the above stated symptoms.  She was last known well at 830 pm when her daughter noted that she was less responsive,with left face droop, and unable to move the left side.  Megan [name] has been ill in the past few days and her daughter expressed that she has been very sleepy, less responsive, and frail but with negative lab and imaging evaluation. She was seen by her vascular neurologist Dr. Pearlean Brownie yesterday and a concern declining cognition and for possible partial complex seizures was raised. According to patient's daughter, she got a call from Dr. Pearlean Brownie tonight recommending starting Depakote due to abnormal EEG findings.  CT brain tonight showed no acute intracranial abnormality.   Date last known well: 08/08/13  Time last known well: 830PM  tPA Given: no, rapidly improving. As per chart review, one of her prior strokes was ICH. Concern that current presentation could be a seizure.  SUBJECTIVE The patient's daughters present. It is believed that the patient's symptoms were secondary to seizure activity.  OBJECTIVE Most recent Vital Signs: Filed Vitals:   08/12/13 2235 08/13/13 0212 08/13/13 0548 08/13/13 1039  BP: 132/58 137/60 153/59 137/57  Pulse: 83 84 79 85  Temp: 98.8 F (37.1 C) 98.1 F (36.7 C) 97.9 F (36.6 C) 97.9 F (36.6 C)  TempSrc: Oral Oral Oral Oral  Resp: 20 18 18 16   Height:      Weight:      SpO2: 98% 100% 100% 100%   CBG (last 3)   Recent Labs  08/12/13 2014 08/13/13 0009 08/13/13 0356  GLUCAP 222* 157* 120*    IV Fluid Intake:     MEDICATIONS  . atorvastatin  10 mg Oral q1800  . clopidogrel  75 mg Oral Q breakfast  . lactose free nutrition  237 mL Oral BID BM  . metFORMIN  500 mg  Oral BID WC  . pantoprazole  40 mg Oral Daily  . valproate sodium  125 mg Intravenous Q12H   PRN:    Diet:  Dysphagia 1 thin liquids Activity:  Bathroom privileges with assistance DVT Prophylaxis:  SCDs  CLINICALLY SIGNIFICANT STUDIES Basic Metabolic Panel:   Recent Labs Lab 08/09/13 0540 08/12/13 0610  NA 131* 140  K 5.1 3.9  CL 98 105  CO2 22 22  GLUCOSE 118* 92  BUN 13 13  CREATININE 0.82 0.78  CALCIUM 9.9 8.9   Liver Function Tests:   Recent Labs Lab 08/09/13 0540 08/13/13 0526  AST 15 11  ALT 5 <5  ALKPHOS 60 56  BILITOT 0.2* 0.2*  PROT 6.7 6.1  ALBUMIN 3.2* 2.6*   CBC:   Recent Labs Lab 08/08/13 2228 08/08/13 2239 08/09/13 0747  WBC 6.3  --  7.0  NEUTROABS 3.2  --  4.3  HGB 10.1* 11.2* 10.2*  HCT 30.6* 33.0* 30.0*  MCV 94.2  --  93.2  PLT 302  --  381   Coagulation:   Recent Labs Lab 08/08/13 2228 08/09/13 0747  LABPROT 13.0 12.6  INR 1.00 0.96   Cardiac Enzymes:   Recent Labs Lab 08/08/13 2228  TROPONINI <0.30   Urinalysis:   Recent Labs Lab 08/09/13 1641  COLORURINE YELLOW  LABSPEC 1.006  PHURINE 8.0  GLUCOSEU NEGATIVE  HGBUR NEGATIVE  BILIRUBINUR NEGATIVE  KETONESUR NEGATIVE  PROTEINUR NEGATIVE  UROBILINOGEN 0.2  NITRITE NEGATIVE  LEUKOCYTESUR NEGATIVE   Lipid Panel    Component Value Date/Time   CHOL 93 07/14/2013 0735   TRIG 60 07/14/2013 0735   HDL 37* 07/14/2013 0735   CHOLHDL 2.5 07/14/2013 0735   VLDL 12 07/14/2013 0735   LDLCALC 44 07/14/2013 0735   HgbA1C  Lab Results  Component Value Date   HGBA1C 6.5* 07/13/2013    Urine Drug Screen:     Component Value Date/Time   LABOPIA NONE DETECTED 07/21/2013 1809   COCAINSCRNUR NONE DETECTED 07/21/2013 1809   LABBENZ NONE DETECTED 07/21/2013 1809   AMPHETMU NONE DETECTED 07/21/2013 1809   THCU NONE DETECTED 07/21/2013 1809   LABBARB NONE DETECTED 07/21/2013 1809    Alcohol Level: No results found for this basename: ETH,  in the last 168 hours  Ct  Head Wo Contrast 08/13/2013   1. No new intracranial process identified. 2. Extensive atrophy with chronic microvascular ischemic changes, stable to prior.      MRI of the brain   08/08/2013 Limited study. Question 6 mm focus of restricted diffusion within  the right pons as above, suspicious for acute ischemic infarct.  Correlation with symptomatology is recommended. In addition, further  evaluation with complete MRI/ MRA could be performed for further  evaluation as the patient is able to tolerate.  MRA of the brain   08/08/2013 Limited study. Question 6 mm focus of restricted diffusion within  the right pons as above, suspicious for acute ischemic infarct.  Correlation with symptomatology is recommended. In addition, further  evaluation with complete MRI/ MRA could be performed for further  evaluation as the patient is able to tolerate.  2D Echocardiogram  performed November 22 ejection fraction 55 to 60%. No cardiac source of emboli identified  Carotid Doppler  performed November 22 The vertebral arteries appear patent with antegrade flow. - Findings consistent with 1-39 percent stenosis involving the right internal carotid artery and the left internal carotid artery.  CXR    EKG  sinus rhythm rate 87 beats per minute  Therapy Recommendations home health PT versus skilled nursing facility placement  Physical Exam   Filed Vitals:   08/13/13 1039  BP: 137/57  Pulse: 85  Temp: 97.9 F (36.6 C)  Resp: 16    . Head is nontraumatic. Neck is supple without bruit.  . Cardiac exam no murmur or gallop. Lungs are clear to auscultation. Distal pulses are well felt. Neurologic Exam:  Mental Status:  Lying in bed, somnolent, arousable to touch. Speech minimal. Follows simple commands  Cranial Nerves:  II - patient blinks to bilateral confrontation, PERRL  III/IV/VI - EOM's grossly intact with patient scanning the room often  V/VII - left-sided facial droop  VIII - difficulty  hearing bilaterally (chronic)  IX,X - palate elevates bilaterally  XI: unable to fully participate  XII: tongue strength normal  Motor: increased tone of right arm and leg (chronic, from old CVA). Moves L arm and leg spontaneously but unable to fully participate in strength testing  Deep Tendon Reflexes: 1+ bilaterally  Cerebellar: unable to assess  Carotid auscultation: No bruit   ASSESSMENT Megan [name] is a 79 y.o. female presenting with decreased level of responsiveness, facial droop, and left hemiparesis. TPA was not given as the patient is rapidly improving. Imaging confirmed a questionable 6 mm focus of restricted diffusion within  the right pons as above, suspicious for  acute ischemic infarct.Infarct felt to be thrombotic secondary to small vessel disease.  On dipyridamole SR 250 mg/aspirin 25 mg orally twice a day prior to admission. Now on clopidogrel 75 mg orally every day for secondary stroke prevention. Patient with resultant lethargy and left hemiparesis. Work up completed.   CVA history  Diabetes mellitus hemoglobin A1c 6.5  Hyperlipidemia cholesterol 93 LDL 44  Seizure history - currently on IV Depacon 125 mg every 12 hours - prior to admission was on Depakote ER 500 mg daily  Hospital day # 5  TREATMENT/PLAN  Continue clopidogrel 75 mg orally every day for secondary stroke prevention.  ? Increased Depacon ?  Home health physical therapy versus skilled nursing facility placement.  Risk factor modification  Repeat EEG today  Possible discharge tomorrow  Hassel Neth [facility] Pager [phone] 08/13/2013, 3:23 PM  I have personally obtained a history, examined the patient, evaluated imaging results, and formulated the assessment and plan of care. I agree with the above. Delia Heady, MD

## 2013-08-13 NOTE — Care Management Note (Signed)
    Page 1 of 2   08/15/2013     10:30:38 AM   CARE MANAGEMENT NOTE 08/15/2013  Patient:  Megan [name], Megan [name]   Account Number:  [id number]  Date Initiated:  08/13/2013  Documentation initiated by:  Elmer Bales  Subjective/Objective Assessment:   Patient admitted with CVA. Lives at home with family.  Has [facility] for RN, PT and NA.     Action/Plan:   Will follow for discharge needs.   Anticipated DC Date:  08/15/2013   Anticipated DC Plan:  HOME W HOME HEALTH SERVICES      DC Planning Services  CM consult      Choice offered to / List presented to:  C-4 Adult Children        HH arranged  HH-1 RN  HH-2 PT  HH-5 SPEECH THERAPY      HH agency  [facility]   Status of service:  Completed, signed off Medicare Important Message given?   (If response is "NO", the following Medicare IM given date fields will be blank) Date Medicare IM given:   Date Additional Medicare IM given:    Discharge Disposition:    Per UR Regulation:  Reviewed for med. necessity/level of care/duration of stay  If discussed at Long Length of Stay Meetings, dates discussed:    Comments:  08/15/13 0850 Elmer Bales RN, MSN, CM- Met with patient's daughter Dennie Bible to verify that they still plan to use [facility] at discharge.  Mary with [facility] was notified that family will continue services with them and patient will be discharging home today.   08/13/13 1600 Elmer Bales RN, MSN, CM- Met with patient and family to discuss discharge planning.  Family has been caring for patient at home since her previous CVA in 2012.  They have used [facility], which they are interested in using again should they choose to take patient home.  Family is interested in discussing options for SNF placement. CSW is aware and will meet with patient and family. CM will continue to follow.

## 2013-08-13 NOTE — Progress Notes (Signed)
Subjective: Much more awake yesterday, eating and drinking.  Objective: Vital signs in last 24 hours: Temp:  [97.6 F (36.4 C)-98.9 F (37.2 C)] 97.9 F (36.6 C) (12/22 0548) Pulse Rate:  [78-93] 79 (12/22 0548) Resp:  [16-20] 18 (12/22 0548) BP: (116-153)/(44-61) 153/59 mmHg (12/22 0548) SpO2:  [98 %-100 %] 100 % (12/22 0548) Weight change:  Last BM Date: 08/09/13  Intake/Output from previous day:   Intake/Output this shift:    General appearance: alert and cooperative  Lab Results: No results found for this basename: WBC, HGB, HCT, PLT,  in the last 72 hours BMET  Recent Labs  08/12/13 0610  NA 140  K 3.9  CL 105  CO2 22  GLUCOSE 92  BUN 13  CREATININE 0.78  CALCIUM 8.9    Studies/Results: No results found.  Medications: I have reviewed the patient's current medications.  Assessment/Plan: Principal Problem:  Altered mental status episode of decreased mentation and L sided weakness, suggesting possible partial complex seizures, depakote started. MRI suggests possible new cva in R pons. 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 Diabetes mellitus controlled on metformin  HTN (hypertension) has not required medication  Oropharyngael dysphagia, speech therapy noted, D1 diet with thin liquids. Add Boost per family request  Severe protein malnutrition, appreciated nutrition input  Disposition, PT to see today, possible discharge next 24-48 hours   LOS: 5 days   Megan [name] 08/13/2013, 6:27 AM

## 2013-08-13 NOTE — Progress Notes (Signed)
Speech Language Pathology Treatment: Dysphagia  Patient Details Name: Megan [name] MRN: [id number] DOB: May 25, 1922 Today's Date: 08/13/2013 Time: 4098-1191 SLP Time Calculation (min): 22 min  Assessment / Plan / Recommendation Clinical Impression  Pt with improved mental status today, found sitting upright in chair with RN providing pt with medication.   SLP encouraged pt to self feed for improved neural input/swallow safety and helped pt to self feed by holding cup while she scooped applesauce.   No s/s of aspiration - excessive oral manipulation and suspected mild pharyngeal delay in swallow reflex likely due to her dementia and h/o CVA.    Note pt receiving Boost liquid nutritional supplements.  Advised daughter to recommendation to continue puree/thin currently and encouraging pt to self feed. Since pt just since yesterday becoming more alert per rn and daughter, advise continued strict aspiration precautions.    Recommend for family to advance diet at home as tolerated to soft when pt will wear dentures and modify diet based on pt's daily function.      No further SLP indicated as pt appears to be on appropriate diet for her current status and family educated to precautions.  Thanks for this consult.     HPI HPI: 79 y.o. female brought in with altered mental status, left sided weakness, left face droop, and slurred speech. Vascular dementia at baseline. Per daughter, pt had previous CVA in 2012 resulting in right HP and dysphagia.  Pt now with concern for possible CVA vs partial complex seizures. For MRI today per daughter.  Bedside swallow evaluation completed last week, pt seen today to assess tolerance of po diet and readiness for dietary advancement.     Pertinent Vitals Afebrile, decreased, intake good with breakfast today per daughter  SLP Plan  All goals met    Recommendations Diet recommendations: Dysphagia 1 (puree);Thin liquid Liquids provided via: Cup;Straw Medication  Administration: Crushed with puree Supervision: Patient able to self feed;Full supervision/cueing for compensatory strategies Compensations: Slow rate;Small sips/bites;Check for pocketing (assure pt swallows before giving her more) Postural Changes and/or Swallow Maneuvers: Seated upright 90 degrees;Upright 30-60 min after meal              Oral Care Recommendations: Oral care BID Follow up Recommendations: None;24 hour supervision/assistance Plan: All goals met    GO     [name], MS [facility] SLP [phone]

## 2013-08-13 NOTE — Progress Notes (Signed)
Physical Therapy Treatment Patient Details Name: Megan [name] MRN: [id number] DOB: 20-May-1922 Today's Date: 08/13/2013 Time: 8413-2440 PT Time Calculation (min): 23 min  PT Assessment / Plan / Recommendation  History of Present Illness pt presents with Encephalopathy and r/o CVA.  pt with hx of CVA.     PT Comments   Patient progressing well this session and able to SPT to the recliner. Daughter present throughout and very pleased to see patient more alert and able to participate more than this past weekend. They are still planning to take patient home when she is medically ready  Follow Up Recommendations  Home health PT;Supervision/Assistance - 24 hour     Does the patient have the potential to tolerate intense rehabilitation     Barriers to Discharge        Equipment Recommendations  None recommended by PT    Recommendations for Other Services    Frequency Min 3X/week   Progress towards PT Goals Progress towards PT goals: Progressing toward goals  Plan Current plan remains appropriate    Precautions / Restrictions Precautions Precautions: Fall   Pertinent Vitals/Pain no apparent distress    Mobility  Bed Mobility Supine to Sit: 3: Mod assist;HOB flat Sitting - Scoot to Edge of Bed: 2: Max assist Details for Bed Mobility Assistance: A for LEs and trunk elevation off of bed. Use of chuck pad to scoot EOB Transfers Sit to Stand: 1: +2 Total assist Sit to Stand: Patient Percentage: 60% Stand to Sit: 1: +2 Total assist Stand to Sit: Patient Percentage: 60% Stand Pivot Transfers: 1: +2 Total assist Stand Pivot Transfers: Patient Percentage: 60% Details for Transfer Assistance: Patient requiring more assist to stand this session. A to initiate stand and for posture. A with hip facilitation but patient able to take small steps towards the recliner. Per daughter patient walking very little at home. They do have a WC Ambulation/Gait Ambulation/Gait Assistance: Not tested  (comment)    Exercises     PT Diagnosis:    PT Problem List:   PT Treatment Interventions:     PT Goals (current goals can now be found in the care plan section)    Visit Information  Last PT Received On: 08/13/13 Assistance Needed: +2 History of Present Illness: pt presents with Encephalopathy and r/o CVA.  pt with hx of CVA.      Subjective Data      Cognition  Cognition Arousal/Alertness: Awake/alert Behavior During Therapy: Flat affect Overall Cognitive Status: History of cognitive impairments - at baseline Difficult to assess due to: Hard of hearing/deaf    Balance  Static Sitting Balance Static Sitting - Balance Support: Bilateral upper extremity supported;Feet supported Static Sitting - Level of Assistance: 4: Min assist  End of Session PT - End of Session Equipment Utilized During Treatment: Gait belt Activity Tolerance: Patient tolerated treatment well Patient left: in chair;with call bell/phone within reach;with family/visitor present Nurse Communication: Mobility status   GP     Fredrich Birks 08/13/2013, 12:50 PM 08/13/2013 Fredrich Birks PTA [phone] pager [phone] office

## 2013-08-13 NOTE — Progress Notes (Signed)
PT NOTE  Spoke with OT who know is saying patients family is requesting SNF. On initial evaluation family adamant on taking patient back home and when speaking with them this morning that was still the plan. This afternoon while working with OT, the family decided they would not be able to provide the +2 assist that is needed for OOB mobility. Agree that SNF is the most appropriate option at this time and will change POC accordingly.  Thanks 08/13/2013 Fredrich Birks PTA [phone] pager [phone] office

## 2013-08-13 NOTE — Progress Notes (Signed)
Agree with PTA.    Levi Klaiber, PT [phone]  

## 2013-08-13 NOTE — Progress Notes (Signed)
EEG completed; results pending.    

## 2013-08-14 DIAGNOSIS — I6350 Cerebral infarction due to unspecified occlusion or stenosis of unspecified cerebral artery: Secondary | ICD-10-CM

## 2013-08-14 DIAGNOSIS — G459 Transient cerebral ischemic attack, unspecified: Secondary | ICD-10-CM

## 2013-08-14 LAB — GLUCOSE, CAPILLARY
Glucose-Capillary: 167 mg/dL — ABNORMAL HIGH (ref 70–99)
Glucose-Capillary: 162 mg/dL — ABNORMAL HIGH (ref 70–99)

## 2013-08-14 MED ORDER — DIVALPROEX SODIUM ER 500 MG PO TB24
500.0000 mg | ORAL_TABLET | Freq: Every day | ORAL | Status: DC
  Filled 2013-08-14: qty 1

## 2013-08-14 MED ORDER — DIVALPROEX SODIUM 125 MG PO CPSP
250.0000 mg | ORAL_CAPSULE | Freq: Two times a day (BID) | ORAL | Status: DC
  Administered 2013-08-14 – 2013-08-15 (×2): 250 mg via ORAL
  Filled 2013-08-14 (×4): qty 2

## 2013-08-14 NOTE — Progress Notes (Signed)
Subjective: Much more  Alert, eating, slept well  Objective: Vital signs in last 24 hours: Temp:  [97.9 F (36.6 C)-98.6 F (37 C)] 98.6 F (37 C) (12/23 0110) Pulse Rate:  [85-90] 87 (12/23 0110) Resp:  [16-20] 20 (12/23 0110) BP: (137-155)/(49-67) 154/60 mmHg (12/23 0110) SpO2:  [99 %-100 %] 99 % (12/23 0110) Weight change:  Last BM Date: 08/09/13  Intake/Output from previous day:   Intake/Output this shift:    General appearance: alert Resp: clear to auscultation bilaterally Cardio: regular rate and rhythm, S1, S2 normal, no murmur, click, rub or gallop  Lab Results: No results found for this basename: WBC, HGB, HCT, PLT,  in the last 72 hours BMET  Recent Labs  08/12/13 0610  NA 140  K 3.9  CL 105  CO2 22  GLUCOSE 92  BUN 13  CREATININE 0.78  CALCIUM 8.9    Studies/Results: Ct Head Wo Contrast  08/13/2013   CLINICAL DATA:  Lethargy  EXAM: CT HEAD WITHOUT CONTRAST  TECHNIQUE: Contiguous axial images were obtained from the base of the skull through the vertex without intravenous contrast.  COMPARISON:  Prior MRI from 08/09/2013  FINDINGS: Diffuse cerebral volume loss and atrophy is again seen, stable as compared to prior study. Chronic microvascular ischemic changes are also stable. There is no new large vessel territory infarct or intracranial hemorrhage. Previously questioned small infarct involving the right pons is not definitely visualized, although this would be expected to be extremely difficult to visualize given its small size and location within the pons. No extra-axial fluid collection. No mass or midline shift.  Calvarium is intact. Orbits are normal. Paranasal sinuses and mastoid air cells are clear.  IMPRESSION: 1. No new intracranial process identified. 2. Extensive atrophy with chronic microvascular ischemic changes, stable to prior.   Electronically Signed   By: Rise Mu M.D.   On: 08/13/2013 06:33    Medications: I have reviewed the  patient's current medications.  Assessment/Plan: Principal Problem:  Altered mental status episode of decreased mentation and L sided weakness, suggesting possible partial complex seizures, depakote started. MRI suggests possible new cva in R pons. 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  Repeat EEG pending Active Problems:  Lethargy much inproved, depakote dose decreased and level therapeutic, CT brain no change Diabetes mellitus controlled on metformin  HTN (hypertension) has not required medication  Oropharyngael dysphagia, speech therapy noted, D1 diet with thin liquids. Add Boost per family request  Severe protein malnutrition, appreciated nutrition input  Disposition, family deciding between home with home health vs [facility]. SW consult.   LOS: 6 days   [facility] 08/14/2013, 7:41 AM

## 2013-08-14 NOTE — Procedures (Addendum)
ELECTROENCEPHALOGRAM REPORT   Patient: Megan [name]       Room #: 1O10  Age: 79 y.o.        Sex: female Referring Physician: Valentina Lucks Report Date:  08/13/2013        Interpreting Physician: Thana Farr D  History: [name] is an 79 y.o. female s/p acute infarct with altered mental status  Medications:  Scheduled: . clopidogrel  75 mg Oral Q breakfast  . divalproex  250 mg Oral Q12H  . lactose free nutrition  237 mL Oral BID BM  . metFORMIN  500 mg Oral BID WC  . pantoprazole  40 mg Oral Daily  . rosuvastatin  5 mg Oral q1800    Conditions of Recording:  This is a 16 channel EEG carried out with the patient in the awake and drowsy states.  Description:  The waking background activity consists of a low voltage, fairly well organized, 7 Hz alpha activity, seen from the parieto-occipital and posterior temporal regions.  Low voltage fast activity, poorly organized, is seen anteriorly and is at times superimposed on more posterior regions.  A mixture of theta and alpha rhythms are seen from the central and temporal regions.  This activity is of lower voltage over the right hemisphere.   The patient drowses with slowing to irregular, low voltage theta and beta activity.   Stage II sleep is not obtained. Hyperventilation and intermittent photic stimulation were not performed.  IMPRESSION: This EEG reveals hemispheric asymmetry which is consistent with her history of infarct.  No epileptiform activity is noted.     Thana Farr, MD [facility] [phone] 08/14/2013, 1:35 PM

## 2013-08-14 NOTE — Progress Notes (Signed)
Stroke Team Progress Note  HISTORY Megan [name] is an 79 y.o. female with a past medical history significant for HTN, hyperlipidemia, DM, stroke x 2 with residual right hemiparesis, possible vascular dementia, seizures, brought to [facility] ED by ambulance as a code stroke due to LEFT SIDED WEAKNESS, SLURRED SPECCH, DECREASED RESPONSIVENESS. She was last known well at 08/08/2013 at 830 pm when her daughter noted that she was less responsive,with left face droop, and unable to move the left side. Mrs. Bolser has been ill in the past few days and her daughter expressed that she has been very sleepy, less responsive, and frail but with negative lab and imaging evaluation. She was seen by her vascular neurologist Dr. Pearlean Brownie yesterday and a concern declining cognition and for possible partial complex seizures was raised. According to patient's daughter, she got a call from Dr. Pearlean Brownie tonight recommending starting Depakote due to abnormal EEG findings.  CT brain tonight showed no acute intracranial abnormality. TPA was not given due to rapidly improving symptoms. As per chart review, one of her prior strokes was ICH and concern that current presentation could be a seizure.  SUBJECTIVE Daughter and family member at bedside. Reports her sister is reluctant, but feels plan is to go to [place] for rehab at time of discharge.   OBJECTIVE Most recent Vital Signs: Filed Vitals:   08/13/13 1859 08/13/13 2200 08/14/13 0110 08/14/13 0600  BP: 155/67 139/49 154/60 133/61  Pulse: 90 88 87 93  Temp: 98.1 F (36.7 C) 98.5 F (36.9 C) 98.6 F (37 C) 97.1 F (36.2 C)  TempSrc: Oral Oral Oral Oral  Resp: 18 20 20 18   Height:      Weight:      SpO2: 100% 100% 99% 100%   CBG (last 3)   Recent Labs  08/13/13 2021 08/14/13 0104 08/14/13 0756  GLUCAP 167* 167* 119*    IV Fluid Intake:     MEDICATIONS  . clopidogrel  75 mg Oral Q breakfast  . lactose free nutrition  237 mL Oral BID BM  . metFORMIN  500 mg Oral BID  WC  . pantoprazole  40 mg Oral Daily  . rosuvastatin  5 mg Oral q1800  . valproate sodium  125 mg Intravenous Q12H   PRN:    Diet:  Dysphagia 1 thin liquids Activity:  Bathroom privileges with assistance DVT Prophylaxis:  SCDs  CLINICALLY SIGNIFICANT STUDIES Basic Metabolic Panel:   Recent Labs Lab 08/09/13 0540 08/12/13 0610  NA 131* 140  K 5.1 3.9  CL 98 105  CO2 22 22  GLUCOSE 118* 92  BUN 13 13  CREATININE 0.82 0.78  CALCIUM 9.9 8.9   Liver Function Tests:   Recent Labs Lab 08/09/13 0540 08/13/13 0526  AST 15 11  ALT 5 <5  ALKPHOS 60 56  BILITOT 0.2* 0.2*  PROT 6.7 6.1  ALBUMIN 3.2* 2.6*   CBC:   Recent Labs Lab 08/08/13 2228 08/08/13 2239 08/09/13 0747  WBC 6.3  --  7.0  NEUTROABS 3.2  --  4.3  HGB 10.1* 11.2* 10.2*  HCT 30.6* 33.0* 30.0*  MCV 94.2  --  93.2  PLT 302  --  381   Coagulation:   Recent Labs Lab 08/08/13 2228 08/09/13 0747  LABPROT 13.0 12.6  INR 1.00 0.96   Cardiac Enzymes:   Recent Labs Lab 08/08/13 2228  TROPONINI <0.30   Urinalysis:   Recent Labs Lab 08/09/13 1641  COLORURINE YELLOW  LABSPEC 1.006  PHURINE 8.0  GLUCOSEU NEGATIVE  HGBUR NEGATIVE  BILIRUBINUR NEGATIVE  KETONESUR NEGATIVE  PROTEINUR NEGATIVE  UROBILINOGEN 0.2  NITRITE NEGATIVE  LEUKOCYTESUR NEGATIVE   Lipid Panel    Component Value Date/Time   CHOL 93 07/14/2013 0735   TRIG 60 07/14/2013 0735   HDL 37* 07/14/2013 0735   CHOLHDL 2.5 07/14/2013 0735   VLDL 12 07/14/2013 0735   LDLCALC 44 07/14/2013 0735   HgbA1C  Lab Results  Component Value Date   HGBA1C 6.5* 07/13/2013    Urine Drug Screen:     Component Value Date/Time   LABOPIA NONE DETECTED 07/21/2013 1809   COCAINSCRNUR NONE DETECTED 07/21/2013 1809   LABBENZ NONE DETECTED 07/21/2013 1809   AMPHETMU NONE DETECTED 07/21/2013 1809   THCU NONE DETECTED 07/21/2013 1809   LABBARB NONE DETECTED 07/21/2013 1809    Alcohol Level: No results found for this basename: ETH,  in  the last 168 hours  Ct Head Wo Contrast 08/13/2013   1. No new intracranial process identified. 2. Extensive atrophy with chronic microvascular ischemic changes, stable to prior.     MRI/A of the brain   08/08/2013 Limited study. Question 6 mm focus of restricted diffusion within the right pons as above, suspicious for acute ischemic infarct. Correlation with symptomatology is recommended.   2D Echocardiogram  performed November 22 ejection fraction 55 to 60%. No cardiac source of emboli identified  Carotid Doppler  07/14/13 The vertebral arteries appear patent with antegrade flow. Findings consistent with 1-39 percent stenosis involving the right internal carotid artery and the left internal carotid artery.  CXR    EKG  sinus rhythm rate 87 beats per minute  EEG  normal drowse. Can not rule out the possibility of slowing related to general cerebral disturbance such as a metabolic encephalopathy. Clinical correlation recommended. No epileptiform activity is noted.   Therapy Recommendations home health PT versus skilled nursing facility placement  Physical Exam   Filed Vitals:   08/14/13 0600  BP: 133/61  Pulse: 93  Temp: 97.1 F (36.2 C)  Resp: 18   Physical Exam Head is nontraumatic. Neck is supple without bruit.  . Cardiac exam no murmur or gallop. Lungs are clear to auscultation. Distal pulses are well felt. Neurologic Exam:  Mental Status:  Lying in bed, somnolent, arousable to touch. Speech minimal. Follows simple commands  Cranial Nerves:  II - patient blinks to bilateral confrontation, PERRL  III/IV/VI - EOM's grossly intact with patient scanning the room often  V/VII - left-sided facial droop  VIII - difficulty hearing bilaterally (chronic)  IX,X - palate elevates bilaterally  XI: unable to fully participate  XII: tongue strength normal  Motor: increased tone of right arm and leg (chronic, from old CVA). Moves L arm and leg spontaneously but unable to fully  participate in strength testing  Deep Tendon Reflexes: 1+ bilaterally  Cerebellar: unable to assess  Carotid auscultation: No bruit   ASSESSMENT [name] is a 79 y.o. female presenting with decreased level of responsiveness, facial droop, and left hemiparesis. MRI confirms right pontine infarct, an incidental finding. DX:   Pt with seizures as cause of symptoms; seizures a resultant effect of previous/old stroke. Pontine infarct felt to be thrombotic secondary to small vessel disease.  On dipyridamole SR 250 mg/aspirin 25 mg orally twice a day prior to admission. Now on clopidogrel 75 mg orally every day for secondary stroke prevention. Patient with resultant lethargy (improved) and left hemiparesis. Work up completed.   CVA history  Diabetes  mellitus hemoglobin A1c 6.5  Hyperlipidemia cholesterol 93 LDL 44  Seizure history - currently on IV Depacon 125 mg every 12 hours - prior to admission was on Depakote ER 500 mg daily - level yesterday 56.8, WNL  Hospital day # 6  TREATMENT/PLAN  Continue clopidogrel 75 mg orally every day for secondary stroke prevention.  Change depacon to po as prior to admission  Home health physical therapy versus skilled nursing facility placement.  No further stroke workup indicated. Ongoing risk factor control by Primary Care Physician Stroke Service will sign off. Please call should any needs arise. Follow up with Dr. Pearlean Brownie, Stroke Clinic, in 2 months or previously scheduled appt.  Annie Main, MSN, RN, ANVP-BC, ANP-BC, [facility] Pager: [phone] 08/14/2013 9:03 AM  I have personally obtained a history, examined the patient, evaluated imaging results, and formulated the assessment and plan of care. I agree with the above. Delia Heady, MD

## 2013-08-14 NOTE — Progress Notes (Signed)
Physical Therapy Treatment Patient Details Name: Megan [name] MRN: [id number] DOB: 07-01-1922 Today's Date: 08/14/2013 Time: 4098-1191 PT Time Calculation (min): 24 min  PT Assessment / Plan / Recommendation  History of Present Illness pt presents with Encephalopathy and r/o CVA.  pt with hx of CVA.     PT Comments   Patient more alert and awake today. Able to state "get me to the chair" when asked if she needed to use the bathroom prior to sitting up in the recliner. Patient able to take some pivotal steps to the recliner this session. Family present and still making decisions on SNF facilities.   Follow Up Recommendations  SNF     Does the patient have the potential to tolerate intense rehabilitation     Barriers to Discharge        Equipment Recommendations  None recommended by PT    Recommendations for Other Services    Frequency Min 3X/week   Progress towards PT Goals Progress towards PT goals: Progressing toward goals  Plan Current plan remains appropriate    Precautions / Restrictions Precautions Precautions: Fall   Pertinent Vitals/Pain Denied pain    Mobility  Bed Mobility Supine to Sit: 4: Min assist Details for Bed Mobility Assistance: Patient able to help more with her legs today and required little assist for her shoulders up into upright sitting Transfers Sit to Stand: 1: +1 Total assist;With upper extremity assist;From bed Sit to Stand: Patient Percentage: 50% Stand to Sit: 1: +1 Total assist;With upper extremity assist;To chair/3-in-1 Stand to Sit: Patient Percentage: 50% Stand Pivot Transfers: 1: +2 Total assist Stand Pivot Transfers: Patient Percentage: 40% Details for Transfer Assistance: Patient continues to have some posterior lean with standing but appeared to have more control with LEs and able to take small pivotal steps to recliner    Exercises     PT Diagnosis:    PT Problem List:   PT Treatment Interventions:     PT Goals (current goals  can now be found in the care plan section)    Visit Information  Last PT Received On: 08/14/13 Assistance Needed: +2 (helpful) Reason Eval/Treat Not Completed: Fatigue/lethargy limiting ability to participate History of Present Illness: pt presents with Encephalopathy and r/o CVA.  pt with hx of CVA.      Subjective Data      Cognition  Cognition Arousal/Alertness: Awake/alert Behavior During Therapy: Flat affect Overall Cognitive Status: Difficult to assess Difficult to assess due to: Hard of hearing/deaf    Balance  Static Sitting Balance Static Sitting - Balance Support: Bilateral upper extremity supported;Feet supported Static Sitting - Level of Assistance: 5: Stand by assistance  End of Session PT - End of Session Equipment Utilized During Treatment: Gait belt Activity Tolerance: Patient tolerated treatment well Patient left: in chair;with call bell/phone within reach;with family/visitor present Nurse Communication: Mobility status   GP     Fredrich Birks 08/14/2013, 11:34 AM 08/14/2013 Fredrich Birks PTA [phone] pager [phone] office

## 2013-08-15 LAB — GLUCOSE, CAPILLARY
Glucose-Capillary: 197 mg/dL — ABNORMAL HIGH (ref 70–99)
Glucose-Capillary: 211 mg/dL — ABNORMAL HIGH (ref 70–99)

## 2013-08-15 MED ORDER — CLOPIDOGREL BISULFATE 75 MG PO TABS: 75.0000 mg | ORAL_TABLET | Freq: Every day | ORAL | Status: AC

## 2013-08-15 NOTE — Discharge Summary (Signed)
Physician Discharge Summary  Patient ID: Megan [name] MRN: [id number] DOB/AGE: August 02, 1922 79 y.o.  Admit date: 08/08/2013 Discharge date: 08/15/2013  Admission Diagnoses: Altered mental status Diabetes mellitus Hypertension  Discharge Diagnoses:  Principal Problem:  Partial complex seizures Active Problems: CVA, pons   Diabetes mellitus   HTN (hypertension) Oropharyngeal dysphagia Protein calorie malnutrition   Discharged Condition: good  Hospital Course: The patient was admitted on 08/08/2013. On the day of admission she became nonverbal associated with some left-sided facial droop and not able to move her left side. She had just had an EEG by neurology as an outpatient which had shown seizure activity and is been started on Depakote. CT scan of the brain was negative for hemorrhage and showed no acute changes atrophy and extensive chronic small vessel disease. An MRI of the brain was suspicious for an acute ischemic infarct in the right pons although was of limited study. Aggrenox was stopped and the patient was switched to Plavix.  An EEG on December 16 showed partial complex seizures. The patient was continued on Depakote. She did experience some prolonged sedation after receiving lorazepam for MRI but her mental status cleared. A repeat CT scan of the brain showed no change. Final EEG showed no evidence of seizure activity. Valproic acid level on December 22 was 56.8. The patient was seen by physical therapy, nursing home placement was considered to oral, the family opted to take the patient how with home health PT, nursing and speech therapy. The patient did have a speech therapy evaluation in the hospital and was found to have significant oropharyngeal dysphagia and placed on a dysphagia 1 diet with thin liquids. Diabetes mellitus remained under good control. Blood pressure is acceptable. At discharge the patient was much more alert, eating and communicating.  Consults:  neurology  Significant Diagnostic Studies: labs: At discharge sodium 140, potassium 3.9, chloride 105 recurrent 22, BUN 13, creatinine 0.78, radiology: MRI: As above and CT scan: As of and EEG  Treatments: IV hydration, anticoagulation: Plavix and antiseizure meds  Discharge Exam: Blood pressure 147/76, pulse 103, temperature 98.1 F (36.7 C), temperature source Oral, resp. rate 20, height 5\' 1"  (1.549 m), weight 53.524 kg (118 lb), SpO2 100.00%. Resp: clear to auscultation bilaterally Cardio: regular rate and rhythm, S1, S2 normal, no murmur, click, rub or gallop  Disposition: 01-Home or Self Care   Future Appointments Provider Department Dept Phone   10/12/2013 2:30 PM Ronal Fear, NP [facility] [phone]       Medication List    STOP taking these medications       dipyridamole-aspirin 200-25 MG per 12 hr capsule  Commonly known as:  AGGRENOX     donepezil 5 MG tablet  Commonly known as:  ARICEPT     sodium chloride inhaler solution  Commonly known as:  BRONCHO SALINE      TAKE these medications       clopidogrel 75 MG tablet  Commonly known as:  PLAVIX  Take 1 tablet (75 mg total) by mouth daily with breakfast.     divalproex 500 MG 24 hr tablet  Commonly known as:  DEPAKOTE ER  Take 1 tablet (500 mg total) by mouth daily.     metFORMIN 500 MG tablet  Commonly known as:  GLUCOPHAGE  Take 1,000 mg by mouth 2 (two) times daily with a meal.     pantoprazole 40 MG tablet  Commonly known as:  PROTONIX  Take 40 mg by mouth every evening.  rosuvastatin 5 MG tablet  Commonly known as:  CRESTOR  Take 5 mg by mouth every Monday, Wednesday, and Friday.           Follow-up Information   Follow up with Gates Rigg, MD. Schedule an appointment as soon as possible for a visit in 2 months. (stroke clinic; or keep next appt if already scheduled.)    Specialties:  Neurology, Radiology   Contact information:   [address] [place] [address] [phone]       Follow up with Lillia Mountain, MD In 2 weeks.   Specialty:  Internal Medicine   Contact information:   [address] [place] [address] [phone]       Signed: Lillia Mountain 08/15/2013, 7:53 AM

## 2013-08-16 NOTE — Clinical Social Work Placement (Addendum)
    Clinical Social Work Department CLINICAL SOCIAL WORK PLACEMENT NOTE 08/16/2013  Patient:  Megan [name], Megan [name]  Account Number:  [id number] Admit date:  08/08/2013  Clinical Social Worker:  Lupita Leash Corrisa Gibby, LCSWA  Date/time:  08/13/2013 06:30 PM  Clinical Social Work is seeking post-discharge placement for this patient at the following level of care:   SKILLED NURSING   (*CSW will update this form in Epic as items are completed)   08/13/2013  Patient/family provided with [facility] Department of Clinical Social Work's list of facilities offering this level of care within the geographic area requested by the patient (or if unable, by the patient's family).  08/13/2013  Patient/family informed of their freedom to choose among providers that offer the needed level of care, that participate in Medicare, Medicaid or managed care program needed by the patient, have an available bed and are willing to accept the patient.  08/13/2013  Patient/family informed of [facility]' ownership interest in [facility], as well as of the fact that they are under no obligation to receive care at this facility.  PASARR submitted to EDS on 08/14/2013 PASARR number received from EDS on 08/14/2013  FL2 transmitted to all facilities in geographic area requested by pt/family on  08/14/2013 FL2 transmitted to all facilities within larger geographic area on   Patient informed that his/her managed care company has contracts with or will negotiate with  certain facilities, including the following:     Patient/family informed of bed offers received:  08/14/2013 Patient chooses bed at  Physician recommends and patient chooses bed at    Patient to be transferred to  on   Patient to be transferred to facility by Car  The following physician request were entered in Epic:   Additional Comments: 08/14/13  Patient's daughters have decided to take patient home and now defer SNF offers. They feel that they can  manage her care at home with the help of [facility] and a private aide. RNCM Toni Amend is aware of above and completed dc arrangements.  CSW signing of.  Lorri Frederick. Ephram Kornegay, LCSWA [phone]

## 2013-08-16 NOTE — Clinical Social Work Psychosocial (Addendum)
    Clinical Social Work Department BRIEF PSYCHOSOCIAL ASSESSMENT 08/16/2013  Patient:  Megan [name], Megan [name]     Account Number:  [id number]     Admit date:  08/08/2013  Clinical Social Worker:  Tiburcio Pea  Date/Time:  08/13/2013 06:30 PM  Referred by:  Care Management  Date Referred:  08/13/2013 Referred for  SNF Placement   Other Referral:   Interview type:  Other - See comment Other interview type:   Patient and 2 daughters- Megan [name]    PSYCHOSOCIAL DATA Living Status:  FAMILY Admitted from facility:   Level of care:   Primary support name:  Megan Bible  [name] (H) [phone]  (w) [phone] Primary support relationship to patient:  CHILD, ADULT Degree of support available:   strong suport    daughter:  Megan [name] supportive [phone]    CURRENT CONCERNS Current Concerns  Post-Acute Placement   Other Concerns:    SOCIAL WORK ASSESSMENT / PLAN PT is recommending SNF placement. CSW met with patient and her 2 daughters this evening to discuss possible short term rehab. Placement process discussed and questions answered for her 2 daughters. They currently have private duty care at home and are aware of PT's recomendation for SNF. They are concerned about facility's ability to care for patient. They agreed to SNF search and will go and visit some nursing centers. FL2 placed on chart for MD's signature. Will assist with placement.   Assessment/plan status:  Psychosocial Support/Ongoing Assessment of Needs Other assessment/ plan:   Information/referral to community resources:   SNF bed list provided to daughters    PATIENT'S/FAMILY'S RESPONSE TO PLAN OF CARE: Patient is alert but her speech is very difficult to understand.  She appears to understand what is being said to her- she is extremely hard of hearing. Daughters are considering SNF vs home with continued home care services and are very supportive and invovled in their mothers' care. CSW will follow up with family  tomorrow regarding bed offers.  Lorri Frederick. Eyana Stolze, LCSWA [phone]

## 2013-09-05 ENCOUNTER — Telehealth: Payer: Self-pay | Admitting: Neurology

## 2013-09-05 NOTE — Telephone Encounter (Signed)
Please advise 

## 2013-09-05 NOTE — Telephone Encounter (Signed)
HAS EXTREME DROWSINESS, WEAKNESS-? NEEDS TO BE SEEN SOONER THAN FEBRUARY

## 2013-09-05 NOTE — Telephone Encounter (Signed)
See primary Md urgently and may be referred to our office urgently if needed

## 2013-09-06 ENCOUNTER — Telehealth: Payer: Self-pay | Admitting: Neurology

## 2013-09-06 NOTE — Telephone Encounter (Signed)
Spoke with daughter and shared Dr Pearlean BrownieSethi note , she verbalized understanding, has contacted Dr Valentina LucksGriffin, waiting for his call back

## 2013-09-06 NOTE — Telephone Encounter (Signed)
Calling to get her mother in sooner. States she called yesterday but did not receive a call back. States mother is having weakiness and extreme sleepiness. Please call

## 2013-09-07 ENCOUNTER — Encounter (HOSPITAL_COMMUNITY): Payer: Self-pay | Admitting: Emergency Medicine

## 2013-09-07 ENCOUNTER — Emergency Department (HOSPITAL_COMMUNITY)
Admission: EM | Admit: 2013-09-07 | Discharge: 2013-09-07 | Disposition: A | Discharge status: Home / Self Care | Payer: Medicare Other | Attending: Emergency Medicine | Admitting: Emergency Medicine

## 2013-09-07 ENCOUNTER — Emergency Department (HOSPITAL_COMMUNITY): Payer: Medicare Other

## 2013-09-07 DIAGNOSIS — R569 Unspecified convulsions: Secondary | ICD-10-CM

## 2013-09-07 DIAGNOSIS — E785 Hyperlipidemia, unspecified: Secondary | ICD-10-CM | POA: Insufficient documentation

## 2013-09-07 DIAGNOSIS — I69992 Facial weakness following unspecified cerebrovascular disease: Secondary | ICD-10-CM | POA: Insufficient documentation

## 2013-09-07 DIAGNOSIS — Z79899 Other long term (current) drug therapy: Secondary | ICD-10-CM | POA: Insufficient documentation

## 2013-09-07 DIAGNOSIS — I69920 Aphasia following unspecified cerebrovascular disease: Secondary | ICD-10-CM | POA: Insufficient documentation

## 2013-09-07 DIAGNOSIS — G40909 Epilepsy, unspecified, not intractable, without status epilepticus: Secondary | ICD-10-CM | POA: Insufficient documentation

## 2013-09-07 DIAGNOSIS — E119 Type 2 diabetes mellitus without complications: Secondary | ICD-10-CM | POA: Insufficient documentation

## 2013-09-07 DIAGNOSIS — Z7902 Long term (current) use of antithrombotics/antiplatelets: Secondary | ICD-10-CM | POA: Insufficient documentation

## 2013-09-07 LAB — POCT I-STAT TROPONIN I: Troponin i, poc: 0.01 ng/mL (ref 0.00–0.08)

## 2013-09-07 LAB — PROTIME-INR
INR: 1.1 (ref 0.00–1.49)
PROTHROMBIN TIME: 14 s (ref 11.6–15.2)

## 2013-09-07 LAB — CBC WITH DIFFERENTIAL/PLATELET
BASOS ABS: 0 10*3/uL (ref 0.0–0.1)
BASOS PCT: 0 % (ref 0–1)
EOS ABS: 0.1 10*3/uL (ref 0.0–0.7)
EOS PCT: 2 % (ref 0–5)
HEMATOCRIT: 33.5 % — AB (ref 36.0–46.0)
Hemoglobin: 10.8 g/dL — ABNORMAL LOW (ref 12.0–15.0)
Lymphocytes Relative: 38 % (ref 12–46)
Lymphs Abs: 2.4 10*3/uL (ref 0.7–4.0)
MCH: 30.5 pg (ref 26.0–34.0)
MCHC: 32.2 g/dL (ref 30.0–36.0)
MCV: 94.6 fL (ref 78.0–100.0)
MONO ABS: 0.6 10*3/uL (ref 0.1–1.0)
Monocytes Relative: 9 % (ref 3–12)
Neutro Abs: 3.1 10*3/uL (ref 1.7–7.7)
Neutrophils Relative %: 51 % (ref 43–77)
PLATELETS: 212 10*3/uL (ref 150–400)
RBC: 3.54 MIL/uL — ABNORMAL LOW (ref 3.87–5.11)
RDW: 13.5 % (ref 11.5–15.5)
WBC: 6.2 10*3/uL (ref 4.0–10.5)

## 2013-09-07 LAB — URINALYSIS, ROUTINE W REFLEX MICROSCOPIC
Bilirubin Urine: NEGATIVE
Glucose, UA: NEGATIVE mg/dL
Hgb urine dipstick: NEGATIVE
KETONES UR: 15 mg/dL — AB
LEUKOCYTES UA: NEGATIVE
NITRITE: NEGATIVE
PH: 7.5 (ref 5.0–8.0)
Protein, ur: 30 mg/dL — AB
SPECIFIC GRAVITY, URINE: 1.022 (ref 1.005–1.030)
UROBILINOGEN UA: 0.2 mg/dL (ref 0.0–1.0)

## 2013-09-07 LAB — BASIC METABOLIC PANEL
BUN: 25 mg/dL — ABNORMAL HIGH (ref 6–23)
CO2: 24 meq/L (ref 19–32)
Calcium: 10.3 mg/dL (ref 8.4–10.5)
Chloride: 101 mEq/L (ref 96–112)
Creatinine, Ser: 0.89 mg/dL (ref 0.50–1.10)
GFR calc non Af Amer: 56 mL/min — ABNORMAL LOW (ref >90)
GFR, EST AFRICAN AMERICAN: 65 mL/min — AB (ref >90)
Glucose, Bld: 96 mg/dL (ref 70–99)
Potassium: 5 mEq/L (ref 3.7–5.3)
SODIUM: 140 meq/L (ref 137–147)

## 2013-09-07 LAB — URINE MICROSCOPIC-ADD ON

## 2013-09-07 LAB — APTT: aPTT: 27 seconds (ref 24–37)

## 2013-09-07 LAB — VALPROIC ACID LEVEL: Valproic Acid Lvl: 69.4 ug/mL (ref 50.0–100.0)

## 2013-09-07 MED ORDER — DIVALPROEX SODIUM 125 MG PO DR TAB: 125.0000 mg | DELAYED_RELEASE_TABLET | Freq: Every morning | ORAL | Status: DC

## 2013-09-07 MED ORDER — LORAZEPAM 2 MG/ML IJ SOLN: 0.5000 mg | Freq: Once | INTRAMUSCULAR | Status: DC

## 2013-09-07 NOTE — ED Notes (Signed)
Patient transported to MRI 

## 2013-09-07 NOTE — Consult Note (Signed)
Referring Physician: Ward    Chief Complaint: Possible TIA  HPI:                                                                                                                                         Megan [name] is an 80 y.o. female with history of Stroke and residual right sided weakness, vascular dementia seizures who was recently seen in [place] in December 2014 for a period of left unresponsives and increased lethargy.  During that hospital stay MRI showed a questionable right pontine infarct and EEG showed no epileptiform activity. Patient was changed to plavix from aggrenox and discharged on Depakote 500 mg daily.  Today family members woke her up at 7:30 AM and she was noted to be drowsy but able to follow commands.  When she was stood up to ambulate she seem to be slightly weaker on her right side. She was placed back to bed until 9AM  When family went back to see her she seemed very lethargic and they felt she would not respond to them.  Due to fear it could be seizure they called EMS.  Once EMS arrived she quickly came to and was able to recognize family members and seemed to be back to baseline. Since in ED she has been back to her baseline.  At baseline she will sometimes "talk clearly and at times have slurred speech" in addition at times "she is drowsy and other times fully awake".    Currently she is able to follow commands and answer questions.  She is very hard of hearing and often times question will need to be repeated but once she understands, she will follow the command.   Depakote level is 69.4  (previous levels 56.8 and 52.4)  Carotid Doppler performed November 22  The vertebral arteries appear patent with antegrade flow. - Findings consistent with 1-39 percent stenosis involving the right internal carotid artery and the left internal carotid artery. 2D Echocardiogram performed November 22 ejection fraction 55 to 60%. No cardiac source of emboli identified    Date  last known well: Date: 09/06/2013 Time last known well: Unable to determine tPA Given: No: out of window  Past Medical History  Diagnosis Date  . Stroke   . Diabetes mellitus   . Hyperlipidemia   . Seizure     History reviewed. No pertinent past surgical history.  Family History  Problem Relation Age of Onset  . CAD Mother   . Hypertension Mother   . Diabetes Mother   . Stroke Mother    Social History:  reports that she has never smoked. She has quit using smokeless tobacco. Her smokeless tobacco use included Snuff. She reports that she does not drink alcohol or use illicit drugs.  Allergies:  Allergies  Allergen Reactions  . Lipitor [Atorvastatin] Other (See Comments)    Myalgias     Medications:  Current Facility-Administered Medications  Medication Dose Route Frequency Provider Last Rate Last Dose  . LORazepam (ATIVAN) injection 0.5 mg  0.5 mg Intravenous Once Garlon HatchetLisa M Sanders, PA-C       Current Outpatient Prescriptions  Medication Sig Dispense Refill  . clopidogrel (PLAVIX) 75 MG tablet Take 1 tablet (75 mg total) by mouth daily with breakfast.  30 tablet  11  . divalproex (DEPAKOTE ER) 500 MG 24 hr tablet Take 1 tablet (500 mg total) by mouth daily.  60 tablet  1  . metFORMIN (GLUCOPHAGE) 500 MG tablet Take 1,000 mg by mouth 2 (two) times daily with a meal.      . pantoprazole (PROTONIX) 40 MG tablet Take 40 mg by mouth every evening.      . rosuvastatin (CRESTOR) 5 MG tablet Take 5 mg by mouth every Monday, Wednesday, and Friday.         ROS:                                                                                                                                       History obtained from patient family memebers  General ROS: negative for - chills, fatigue, fever, night sweats, weight gain or weight loss Psychological ROS: negative for  - behavioral disorder, hallucinations, memory difficulties, mood swings or suicidal ideation Ophthalmic ROS: negative for - blurry vision, double vision, eye pain or loss of vision ENT ROS: negative for - epistaxis, nasal discharge, oral lesions, sore throat, tinnitus or vertigo Allergy and Immunology ROS: negative for - hives or itchy/watery eyes Hematological and Lymphatic ROS: negative for - bleeding problems, bruising or swollen lymph nodes Endocrine ROS: negative for - galactorrhea, hair pattern changes, polydipsia/polyuria or temperature intolerance Respiratory ROS: negative for - cough, hemoptysis, shortness of breath or wheezing Cardiovascular ROS: negative for - chest pain, dyspnea on exertion, edema or irregular heartbeat Gastrointestinal ROS: negative for - abdominal pain, diarrhea, hematemesis, nausea/vomiting or stool incontinence Genito-Urinary ROS: negative for - dysuria, hematuria, incontinence or urinary frequency/urgency Musculoskeletal ROS: negative for - joint swelling or muscular weakness Neurological ROS: as noted in HPI Dermatological ROS: negative for rash and skin lesion changes  Neurologic Examination:                                                                                                      Blood pressure 98/54, pulse 83, temperature 97.6 F (36.4 C), temperature source Axillary, resp. rate 6, SpO2 100.00%.  Mental Status: Alert, not oriented  to place. Speech minimal but shows shows no aphasia or dysarthria.  Able to follow simple commands. Cranial Nerves: II: Discs flat bilaterally; Visual fields grossly normal, pupils equal, round, reactive to light and accommodation III,IV, VI: ptosis not present, extra-ocular motions intact bilaterally V,VII: smile asymmetric on the right, facial light touch sensation normal bilaterally VIII: hearing decreased bilaterally IX,X: gag reflex present XI: bilateral shoulder shrug XII: midline tongue extension    Motor: Right : Upper extremity   4/5 (distal greater than proximal)(old)  Left:     Upper extremity   5/5  Lower extremity   68/5 (old)       Lower extremity   5/5 Tone and bulk:normal tone throughout; no atrophy noted Sensory: withdraws to pain in all 4 extremities Deep Tendon Reflexes:  Right: Upper Extremity   Left: Upper extremity   biceps (C-5 to C-6) 2/4   biceps (C-5 to C-6) 2/4 tricep (C7) 2/4    triceps (C7) 2/4 Brachioradialis (C6) 2/4  Brachioradialis (C6) 2/4  Lower Extremity Lower Extremity  quadriceps (L-2 to L-4) 1/4   quadriceps (L-2 to L-4) 1/4 Achilles (S1) 0/4   Achilles (S1) 0/4  Plantars: Right: downgoing   Left: downgoing Cerebellar: normal finger-to-nose Gait: not tested CV: pulses palpable throughout    Lab Results: Basic Metabolic Panel:  Recent Labs Lab 09/07/13 1225  NA 140  K 5.0  CL 101  CO2 24  GLUCOSE 96  BUN 25*  CREATININE 0.89  CALCIUM 10.3    Liver Function Tests: No results found for this basename: AST, ALT, ALKPHOS, BILITOT, PROT, ALBUMIN,  in the last 168 hours No results found for this basename: LIPASE, AMYLASE,  in the last 168 hours No results found for this basename: AMMONIA,  in the last 168 hours  CBC:  Recent Labs Lab 09/07/13 1225  WBC 6.2  NEUTROABS 3.1  HGB 10.8*  HCT 33.5*  MCV 94.6  PLT 212    Cardiac Enzymes: No results found for this basename: CKTOTAL, CKMB, CKMBINDEX, TROPONINI,  in the last 168 hours  Lipid Panel: No results found for this basename: CHOL, TRIG, HDL, CHOLHDL, VLDL, LDLCALC,  in the last 168 hours  CBG: No results found for this basename: GLUCAP,  in the last 168 hours  Microbiology: Results for orders placed during the hospital encounter of 08/04/13  URINE CULTURE     Status: None   Collection Time    08/04/13 12:27 PM      Result Value Range Status   Specimen Description URINE, CLEAN CATCH   Final   Special Requests NONE   Final   Culture  Setup Time     Final   Value:  08/04/2013 17:17     Performed at [facility] Count     Final   Value: NO GROWTH     Performed at [facility]   Culture     Final   Value: NO GROWTH     Performed at [facility]   Report Status 08/05/2013 FINAL   Final    Coagulation Studies:  Recent Labs  09/07/13 1225  LABPROT 14.0  INR 1.10    Imaging: Ct Head Wo Contrast  09/07/2013   CLINICAL DATA:  Severe headache.  EXAM: CT HEAD WITHOUT CONTRAST  TECHNIQUE: Contiguous axial images were obtained from the base of the skull through the vertex without intravenous contrast.  COMPARISON:  CT 08/13/2013.  FINDINGS: Moderate atrophy. Chronic microvascular ischemic changes  in the white matter. Chronic infarct in the left midbrain and pons unchanged.  Negative for acute infarct, hemorrhage, or mass. Calvarium is normal.  IMPRESSION: Atrophy and chronic ischemia.  No acute abnormality.   Electronically Signed   By: Marlan Palau M.D.   On: 09/07/2013 13:52    Felicie Morn PA-C [facility] [phone]  09/07/2013, 5:24 PM   Patient seen and examined.  Clinical course and management discussed.  Necessary edits performed.  I agree with the above.  Assessment and plan of care developed and discussed below.    Assessment: 80 y.o. female presenting with a recurrent episode of unresponsiveness.  She has had previous work ups for these events that have been unremarkable.  Her outpatient neurologist id Dr. Pearlean Brownie and these events have been hypothesized to be epileptic in origin (patietn does have a history of a ICH).  MRI of the brain from today has been reviewed and shows no acute changes.  The patient is on Depakote with a level today of 69.4.   Patient is now back to baseline.  Stroke Risk Factors - diabetes mellitus and hyperlipidemia  Recommendations: 1.  Patient will not likely benefit from a repeat of her previous work up.  Will increase Depakote by 125mg  in the morning.  To continue  500mg  at night. 2.  Follow up with Dr. Pearlean Brownie as an outpatient.  His office has been made aware.     Thana Farr, MD [facility] [phone]  09/07/2013  6:46 PM

## 2013-09-07 NOTE — ED Provider Notes (Signed)
CSN: [id number]     Arrival date & time 09/07/13  1141 History   First MD Initiated Contact with Patient 09/07/13 1145     Chief Complaint  Patient presents with  . Altered Mental Status   (Consider location/radiation/quality/duration/timing/severity/associated sxs/prior Treatment) The history is provided by the patient and medical records.   Level 5 caveat:  AMS This is an 80 year old female with past medical history significant for hyperlipidemia, diabetes, seizures, prior hemorrhagic stroke with residual right sided deficits, prior left pontine stroke, presenting to the ED for altered mental status. Patient was last seen normal at 2200 last night.  Daughter went to wake pt this morning and noticed she was more drowsy than normal.  States today she was unable to stand, not following simple commands, and has been aphasic which is not her baseline (usually stands with assist, will carry on a full conversation, and follows commands when prompted).  Per family pt was recently started on Depakote by her neurologist Dr. Pearlean Brownie for suspected partial complex seizure activity which was thought to be contributing to her spells of AMS, no other medications changes identified.  No fevers, sweats, chills, or recent illness at home.  VS stable on arrival.  Last TIA work-up was in November 2014: 2D Echocardiogram performed November 22 ejection fraction 55 to 60%. No cardiac source of emboli identified  Carotid Doppler 07/14/13 The vertebral arteries appear patent with antegrade flow. Findings consistent with 1-39 percent stenosis involving the right internal carotid artery and the left internal carotid artery.   Past Medical History  Diagnosis Date  . Stroke   . Diabetes mellitus   . Hyperlipidemia   . Seizure    No past surgical history on file. Family History  Problem Relation Age of Onset  . CAD Mother   . Hypertension Mother   . Diabetes Mother   . Stroke Mother    History  Substance Use  Topics  . Smoking status: Never Smoker   . Smokeless tobacco: Former Neurosurgeon    Types: Snuff  . Alcohol Use: No   OB History   Grav Para Term Preterm Abortions TAB SAB Ect Mult Living                 Review of Systems  Unable to perform ROS: Mental status change    Allergies  Lipitor  Home Medications   Current Outpatient Rx  Name  Route  Sig  Dispense  Refill  . clopidogrel (PLAVIX) 75 MG tablet   Oral   Take 1 tablet (75 mg total) by mouth daily with breakfast.   30 tablet   11   . divalproex (DEPAKOTE ER) 500 MG 24 hr tablet   Oral   Take 1 tablet (500 mg total) by mouth daily.   60 tablet   1   . metFORMIN (GLUCOPHAGE) 500 MG tablet   Oral   Take 1,000 mg by mouth 2 (two) times daily with a meal.         . pantoprazole (PROTONIX) 40 MG tablet   Oral   Take 40 mg by mouth every evening.         . rosuvastatin (CRESTOR) 5 MG tablet   Oral   Take 5 mg by mouth every Monday, Wednesday, and Friday.          BP 143/73  Temp(Src) 97.3 F (36.3 C) (Oral)  Resp 18  SpO2 98%  Physical Exam  Nursing note and vitals reviewed. Constitutional: She appears well-developed  and well-nourished. No distress.  HENT:  Head: Normocephalic and atraumatic.  Mouth/Throat: Oropharynx is clear and moist.  Eyes: Conjunctivae and EOM are normal. Pupils are equal, round, and reactive to light.  Pinpoint pupils but reactive  Neck: Normal range of motion. Neck supple.  Cardiovascular: Normal rate, regular rhythm and normal heart sounds.   Pulmonary/Chest: Effort normal and breath sounds normal. No respiratory distress. She has no wheezes.  Abdominal: Soft. Bowel sounds are normal. There is no tenderness. There is no guarding.  Musculoskeletal: Normal range of motion. She exhibits no edema.  Neurological: She is alert.  Awake, alert; decreased strength of RUE and RLE when compared with left from prior stroke; normal strength of LUE and LLE; following limited commands; right  facial droop unchanged from baseline  Skin: Skin is warm and dry. She is not diaphoretic.  Psychiatric: She has a normal mood and affect.  aphasic    ED Course  Procedures (including critical care time) Labs Review Labs Reviewed  CBC WITH DIFFERENTIAL - Abnormal; Notable for the following:    RBC 3.54 (*)    Hemoglobin 10.8 (*)    HCT 33.5 (*)    All other components within normal limits  BASIC METABOLIC PANEL - Abnormal; Notable for the following:    BUN 25 (*)    GFR calc non Af Amer 56 (*)    GFR calc Af Amer 65 (*)    All other components within normal limits  URINALYSIS, ROUTINE W REFLEX MICROSCOPIC - Abnormal; Notable for the following:    Ketones, ur 15 (*)    Protein, ur 30 (*)    All other components within normal limits  URINE MICROSCOPIC-ADD ON - Abnormal; Notable for the following:    Casts HYALINE CASTS (*)    All other components within normal limits  APTT  PROTIME-INR  VALPROIC ACID LEVEL  POCT I-STAT TROPONIN I    Imaging Review Ct Head Wo Contrast  09/07/2013   CLINICAL DATA:  Severe headache.  EXAM: CT HEAD WITHOUT CONTRAST  TECHNIQUE: Contiguous axial images were obtained from the base of the skull through the vertex without intravenous contrast.  COMPARISON:  CT 08/13/2013.  FINDINGS: Moderate atrophy. Chronic microvascular ischemic changes in the white matter. Chronic infarct in the left midbrain and pons unchanged.  Negative for acute infarct, hemorrhage, or mass. Calvarium is normal.  IMPRESSION: Atrophy and chronic ischemia.  No acute abnormality.   Electronically Signed   By: Marlan Palauharles  Clark M.D.   On: 09/07/2013 13:52    EKG Interpretation   None       MDM  No diagnosis found.  On arrival, pt is non-verbal and following limited commands.  After family arrived in the ED, pt now following more commands and is starting to speak short phrases.  Upon review of medical records, this increased drowsiness has been going on for a few days as there are  multiple telephone encounter btwn pts family the neurologist and PCP.  Work up today is unremarkable.  Pt has hx of recurrent strokes and complex partial seizures making it difficulty to identify definitive source of pts sx (TIA vs post-ictal state), will consult neurology for further recommendations.  2:32 PM Spoke with neuro hospitalist, Dr. Thad Rangereynolds-- does not feel that pt needs repeat TIA work-up at this time.  Advised to obtain MRI-- if negative, she will see pt in consult, determine if medication changes are needed, and advise on disposition.  4:03 PM Change of shift.  Care  signed out to Dr. Micheline Maze who will follow MRI results and disposition after Dr. Ferne Coe evaluation.  Garlon Hatchet, PA-C 09/07/13 [phone]

## 2013-09-07 NOTE — ED Provider Notes (Addendum)
Medical screening examination/treatment/procedure(s) were conducted as a shared visit with non-physician practitioner(s) and myself.  I personally evaluated the patient during the encounter.  EKG Interpretation    Date/Time:  Friday September 07 2013 11:50:55 EST Ventricular Rate:  89 PR Interval:  180 QRS Duration: 65 QT Interval:  339 QTC Calculation: 412 R Axis:   17 Text Interpretation:  Sinus rhythm Low voltage, extremity and precordial leads Minimal ST elevation, inferior leads No significant change since last tracing Confirmed by Theona Muhs  DO, Edmonia Gonser ([id number]) on 09/07/2013 2:14:07 PM            Patient is a 80 y.o. female with a history of diabetes, hyperlipidemia, prior hemorrhagic stroke and prior ischemic stroke on Plavix, history of possible seizures on Depakote who presents emergency department with increased right-sided weakness and aphasia. Her last seen normal was 10 PM. Family reports the patient is chronically weak in her right eye but that is increased today and she is not talking at all. Family reports she is normally able to hold a conversation. Her glucose is normal. EKG shows no new ischemic changes. Her NIH stroke scale is 4-5. Patient has right arm and right leg weakness, right-sided facial droop but again her chronic but are worse than normal and aphasia. Labs show no acute abnormality. Head CT shows no acute abnormality. Patient is returning to her baseline. She is not a TPA candidate given her symptoms are improving and her last normal was 10 PM last night. Will discuss with neurology for recommendations for possible TIA versus seizure.  Layla MawKristen N Caydin Yeatts, DO 09/07/13 1416  Layla MawKristen N Milessa Hogan, DO 09/07/13 1514

## 2013-09-07 NOTE — ED Notes (Addendum)
PT ambulated with baseline gait; VSS; A&Ox3; no signs of distress; respirations even and unlabored; skin warm and dry; no questions upon discharge. Brief and paper pants put on pt.

## 2013-09-07 NOTE — ED Notes (Signed)
Pt asleep; BP 98/54; explained to family that she does not need ativan at this time. RN to call MRI and let them know.

## 2013-09-07 NOTE — ED Notes (Signed)
Pt arrives from home via EMS for altered mental status and lethargy. Pt per family member difficult to wake this AM. Was unable to stand and bear weight on right leg. Pt with 18g LAC. CBG checked by EMS WNL. Pt with hx of prior stroke on the right. Pt nonverbal, with this RN, VSS, NAD.

## 2013-09-07 NOTE — Telephone Encounter (Signed)
See other note, already done.

## 2013-09-07 NOTE — Discharge Instructions (Signed)
Epilepsy People with epilepsy have times when they shake and jerk uncontrollably (seizures). This happens when there is a sudden change in brain function. Epilepsy may have many possible causes. Anything that disturbs the normal pattern of brain cell activity can lead to seizures. HOME CARE   Follow your doctor's instructions about driving and safety during normal activities.  Get enough sleep.  Only take medicine as told by your doctor.  Avoid things that you know can cause you to have seizures (triggers).  Write down when your seizures happen and what you remember about each seizure. Write down anything you think may have caused the seizure to happen.  Tell the people you live and work with that you have seizures. Make sure they know how to help you. They should:  Cushion your head and body.  Turn you on your side.  Not restrain you.  Not place anything inside your mouth.  Call for local emergency medical help if there is any question about what has happened.  Keep all follow-up visits with your doctor. This is very important. GET HELP IF:  You get an infection or start to feel sick. You may have more seizures when you are sick.  You are having seizures more often.  Your seizure pattern is changing. GET HELP RIGHT AWAY IF:   A seizure does not stop after a few seconds or minutes.  A seizure causes you to have trouble breathing.  A seizure gives you a very bad headache.  A seizure makes you unable to speak or use a part of your body. Document Released: 06/06/2009 Document Revised: 05/30/2013 Document Reviewed: 03/21/2013 [employer] Patient Information 2014 [employer].  

## 2013-09-10 ENCOUNTER — Telehealth: Payer: Self-pay | Admitting: Neurology

## 2013-09-10 NOTE — Telephone Encounter (Signed)
Patient's daughter called concerned that the patient cannot sit up. She is leaning to the right. Symptoms are not stroke like.  Patient  can respond.  Patient's daughter would like a call back from a nurse.

## 2013-09-11 NOTE — Telephone Encounter (Signed)
I have never seen or examined patient, please consult MD.

## 2013-09-11 NOTE — Telephone Encounter (Signed)
Trouble swallowing, leaning to the right. Patient was a ER on Friday. MRI and other tests were negative. This may be a progression of her previous symptoms. Would like guidance on management and how to help patient.

## 2013-09-13 ENCOUNTER — Encounter: Payer: Self-pay | Admitting: Nurse Practitioner

## 2013-09-13 ENCOUNTER — Ambulatory Visit (INDEPENDENT_AMBULATORY_CARE_PROVIDER_SITE_OTHER): Payer: Medicare Other | Admitting: Nurse Practitioner

## 2013-09-13 VITALS — BP 119/64 | HR 94

## 2013-09-13 DIAGNOSIS — F0150 Vascular dementia without behavioral disturbance: Secondary | ICD-10-CM

## 2013-09-13 DIAGNOSIS — Z8673 Personal history of transient ischemic attack (TIA), and cerebral infarction without residual deficits: Secondary | ICD-10-CM

## 2013-09-13 DIAGNOSIS — G40219 Localization-related (focal) (partial) symptomatic epilepsy and epileptic syndromes with complex partial seizures, intractable, without status epilepticus: Secondary | ICD-10-CM

## 2013-09-13 DIAGNOSIS — R404 Transient alteration of awareness: Secondary | ICD-10-CM

## 2013-09-13 DIAGNOSIS — R400 Somnolence: Secondary | ICD-10-CM

## 2013-09-13 MED ORDER — DIVALPROEX SODIUM 125 MG PO DR TAB: 125.0000 mg | DELAYED_RELEASE_TABLET | Freq: Every morning | ORAL | Status: DC

## 2013-09-13 MED ORDER — DIVALPROEX SODIUM ER 500 MG PO TB24: 500.0000 mg | ORAL_TABLET | Freq: Every day | ORAL | Status: DC

## 2013-09-13 NOTE — Patient Instructions (Addendum)
We will check ammonia level and depakote level today.  Keep Depakote dose the same.  Keep next follow up appointment, call sooner if there are problems.  If you want to start aricept, call the office in about 1 week to request it be called in.

## 2013-09-13 NOTE — Progress Notes (Addendum)
PATIENT: [name] DOB: October 10, 1921   REASON FOR VISIT: requests sooner follow up  HISTORY FROM: patient  HISTORY OF PRESENT ILLNESS: 08/07/13 (PS):  88 year African American lady with prior pontine infarct in 2012 with spastic residual right hemiparesis with recurrent episodes of transient right-sided weakness as well as recent episodes of sleepiness and transient worsening of symptoms with multiple visits to the hospital with negative imaging studies as well as lab evaluation. She is seen today for followup after last visit with me on 09/13/12. 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 She apparently was doing fine until 3 weeks ago when she was found to have episode of increasing right-sided weakness in: Speech and sleepiness. She was taken to [facility] and admitted for 8 days and had negative brain imaging studies including CT scan and MRI on 07/13/13 which I personally reviewed which did not reveal any acute stroke. CBC, UA and basic chemistry labs were also normal. Suicidal with shingles affecting the left forearm and was treated with a cycle rate. The daughter feels that she has not been the same since then. She has periods when she is quite sleepy and at times difficult to arouse and other times when she is more awake. Right-sided weakness seems more pronounced when she sleepy. She has seen her primary physician twice and has undergone lab work and CT scan which was negative. She was seen in the ER recently on 08/05/39 and with again negative lab work and CT scan and was given IV fluids and improved. She has also not been ambulating as much she requires one-person assist to walk with a walker and walks quite little. Her blood sugars have been reasonably well controlled with fasting glucoses being in the 130s and postprandials in the 140s. She had lipid profile checked 5 weeks ago which was fine hemoglobin A1c was 6.5. Patient was tried on Keppra for seizure trial but did not tolerate  it due to drowsiness. The patient has also been found to have memory difficulties and has not been as interactive and cognitively has declined for the last several months. She has never been on a trial of Aricept in the past. She had vitamin B12 checked on 07/21/13 which was low-normal at 257 and TSH was normal on 01/07/2012.  EEG was obtained on 08/08/13 with presence of left frontal and temporal spikes and sharp activity indicative of high risk for partial epilepsy. She was started on Depakote ER 500 mg at hs.  UPDATE 09/13/13 (LL):  Patient's daughter called requesting acute visit.  She is concerned that the patient cannot sit up. She is leaning to the right. Symptoms are not stroke like. Patient can respond. She was taken to the hospital on 09/07/13 for tia workup which was negative.  Dr. Thad Ranger increased her Depakote by 125 mg in the am, thinking periods of unresponsiveness and lethary was likely partial seizures.  Daughter states that her mother sometimes turns her neck and head towards the right like she is uncomfortable, but when she asks her if anything is wrong, she replies "no".  Patient is becoming more deconditioned, cannot walk but a couple steps with assistance, but can still pivot from bed to chair.  Her appetite is very poor.  REVIEW OF SYSTEMS: Full 14 system review of systems performed and notable only for:  hearing loss, weakness, slurred speech, seizure?, tremor, walking difficulty, coordination problem, neck stiffness, drooling   ALLERGIES: Allergies  Allergen Reactions  . Lipitor [Atorvastatin]  Other (See Comments)    Myalgias     HOME MEDICATIONS: Outpatient Prescriptions Prior to Visit  Medication Sig Dispense Refill  . clopidogrel (PLAVIX) 75 MG tablet Take 1 tablet (75 mg total) by mouth daily with breakfast.  30 tablet  11  . divalproex (DEPAKOTE ER) 500 MG 24 hr tablet Take 1 tablet (500 mg total) by mouth daily.  60 tablet  1  . divalproex (DEPAKOTE) 125 MG DR tablet  Take 1 tablet (125 mg total) by mouth every morning.  30 tablet  0  . metFORMIN (GLUCOPHAGE) 500 MG tablet Take 1,000 mg by mouth 2 (two) times daily with a meal.      . pantoprazole (PROTONIX) 40 MG tablet Take 40 mg by mouth every evening.      . rosuvastatin (CRESTOR) 5 MG tablet Take 5 mg by mouth every Monday, Wednesday, and Friday.       No facility-administered medications prior to visit.    PAST MEDICAL HISTORY: Past Medical History  Diagnosis Date  . Stroke   . Diabetes mellitus   . Hyperlipidemia   . Seizure     PAST SURGICAL HISTORY: No past surgical history on file.  FAMILY HISTORY: Family History  Problem Relation Age of Onset  . CAD Mother   . Hypertension Mother   . Diabetes Mother   . Stroke Mother     SOCIAL HISTORY: History   Social History  . Marital Status: Widowed    Spouse Name: N/A    Number of Children: N/A  . Years of Education: N/A   Occupational History  . Not on file.   Social History Main Topics  . Smoking status: Never Smoker   . Smokeless tobacco: Former Neurosurgeon    Types: Snuff  . Alcohol Use: No  . Drug Use: No  . Sexual Activity: Not on file   Other Topics Concern  . Not on file   Social History Narrative  . No narrative on file     PHYSICAL EXAM  Filed Vitals:   09/13/13 1431  BP: 119/64  Pulse: 94   Cannot calculate BMI with a height equal to zero.  General: Frail elderly African American lady, seated, in no evident distress  Head: head normocephalic and atraumatic. Orohparynx benign  Neck: supple with no carotid or supraclavicular bruits  Cardiovascular: regular rate and rhythm, no murmurs  Musculoskeletal: no deformity. Mild kyphoscoliosis  Skin: no rash/petichiae  Vascular: Normal pulses all extremities  Neurologic Exam  Mental Status: Awake and fully alert. Disoriented to place and time. Recent and remote memory poor. Attention span, concentration and fund of knowledge diminished. Mood and affect  appropriate. Pseudobulbar speech with dysarthria  Cranial Nerves: Pupils equal, briskly reactive to light. Extraocular movements full without nystagmus. Visual fields full to confrontation. Facial sensation intact but weakness of her right lower face. Tongue, palate moves normally and symmetrically.  VERY HOH. Motor: Spastic right hemiplegia with 2/5 right upper extremity and 3/5 right lower extremity strength with distal greater than proximal weakness. Normal strength on the left side. Increased tone on the right with spasticity  Sensory: Diminished touch and pinprick and vibratory sensation on the right.  Coordination: Unable to follow commands to test. Gait and Station: Not tested as patient is in wheelchair and walks with a walker with one person assist and did not bring her walker  Reflexes: 1+ and asymmetric brisker on right.   DIAGNOSTIC DATA (LABS, IMAGING, TESTING) - I reviewed patient records, labs, notes,  testing and imaging myself where available.  Lab Results  Component Value Date   WBC 6.2 09/07/2013   HGB 10.8* 09/07/2013   HCT 33.5* 09/07/2013   MCV 94.6 09/07/2013   PLT 212 09/07/2013      Component Value Date/Time   NA 140 09/07/2013 1225   K 5.0 09/07/2013 1225   CL 101 09/07/2013 1225   CO2 24 09/07/2013 1225   GLUCOSE 96 09/07/2013 1225   BUN 25* 09/07/2013 1225   CREATININE 0.89 09/07/2013 1225   CALCIUM 10.3 09/07/2013 1225   PROT 6.1 08/13/2013 0526   ALBUMIN 2.6* 08/13/2013 0526   AST 11 08/13/2013 0526   ALT <5 08/13/2013 0526   ALKPHOS 56 08/13/2013 0526   BILITOT 0.2* 08/13/2013 0526   GFRNONAA 56* 09/07/2013 1225   GFRAA 65* 09/07/2013 1225   Lab Results  Component Value Date   CHOL 93 07/14/2013   HDL 37* 07/14/2013   LDLCALC 44 07/14/2013   TRIG 60 07/14/2013   CHOLHDL 2.5 07/14/2013   Lab Results  Component Value Date   HGBA1C 6.5* 07/13/2013   Lab Results  Component Value Date   VITAMINB12 257 07/21/2013   hgba1c on 12/12/13: 7.3 Valproic Acid  Level 12/12/13 : 46  ASSESSMENT AND PLAN [age] year African American lady with remote history of left pontine stroke in 2012 due to small vessel disease with resultant right hemiparesis with several episodes of TIAs and possible partial complex seizures with recent episodes of transient sleepiness of unclear etiology with negative lab and imaging evaluation. 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  Worsening overall condition since last visit; possibly still having partial complex seizures, no episodes since Depakote was increased at ER last Friday.  PLAN:  I had long discussion with patient's daughter about atherosclerosis, partial complex seizures and post-ictal state of seizures and answered questions. Continue current dose of Depakote ER, 125 mg in the am and 500 mg in the pm, and check labs today. Continue Plavix for stroke prevention and strict control of diabetes with hemoglobin A1c goal below 6.5% and lipids with LDL cholesterol goal below 70 mg percent.   Keep next scheduled appointment for next month or call earlier if necessary.   Orders Placed This Encounter  Procedures  . Valproic acid level  . Ammonia   Tawny AsalLYNN E. Nikai Quest, MSN, NP-C 09/13/2013, 3:22 PM [facility] [address], [address] [place], [place] [address] [phone]  Note: This document was prepared with digital dictation and possible smart phrase technology. Any transcriptional errors that result from this process are unintentional.

## 2013-09-14 LAB — VALPROIC ACID LEVEL: Valproic Acid Lvl: 63 ug/mL (ref 50–100)

## 2013-09-14 LAB — AMMONIA: Ammonia: 44 ug/dL (ref 19–87)

## 2013-10-12 ENCOUNTER — Ambulatory Visit: Payer: Medicare Other | Admitting: Nurse Practitioner

## 2013-12-13 ENCOUNTER — Ambulatory Visit: Payer: Medicare Other | Admitting: Podiatry

## 2013-12-17 ENCOUNTER — Encounter: Payer: Self-pay | Admitting: Podiatry

## 2013-12-17 ENCOUNTER — Ambulatory Visit (INDEPENDENT_AMBULATORY_CARE_PROVIDER_SITE_OTHER): Payer: Medicare Other | Admitting: Podiatry

## 2013-12-17 VITALS — BP 155/91 | HR 87 | Resp 18

## 2013-12-17 DIAGNOSIS — B351 Tinea unguium: Secondary | ICD-10-CM

## 2013-12-17 DIAGNOSIS — L03039 Cellulitis of unspecified toe: Secondary | ICD-10-CM

## 2013-12-17 DIAGNOSIS — M79609 Pain in unspecified limb: Secondary | ICD-10-CM

## 2013-12-17 NOTE — Progress Notes (Signed)
Subjective:     Patient ID: Megan [name], female   DOB: 1922/04/24, 80 y.o.   MRN: [id number]  HPI patient presents with caregiver stating that she has a damaged right hallux nail that is loose and painful with redness and drainage noted. It has been localized and they are not sure of a history of trauma due to the patient's mental state. All nails are thick and possible for them to cut them and become painful  Review of Systems  All other systems reviewed and are negative.      Objective:   Physical Exam  Nursing note and vitals reviewed. Constitutional: She is oriented to person, place, and time.  Cardiovascular: Intact distal pulses.   Neurological: She is oriented to person, place, and time.  Skin: Skin is dry.   neurovascular status was found to be intact with varicosities in the ankle region and lower leg of both feet. Patient is found to have diminished circulation to the distal toes but intact of approximate 3 seconds Fill time. Patient has a damaged right hallux nail that is loose and sore when pressed with no proximal edema erythema or lymph node distention     Assessment:     Damaged right hallux nail which does have localized infection both medial lateral and central side and also nail disease of remaining nails that are painful thick and elongated    Plan:     H&P performed and infiltrated the right hallux 60 mg Xylocaine Marcaine mixture remove the hallux nail right flush the base removed all necrotic tissue from the medial and lateral side and applied sterile dressing with instructions on soaks. Debridement all remaining nailbed with no bleeding noted

## 2013-12-17 NOTE — Progress Notes (Signed)
° °  Subjective:    Patient ID: Megan [name], female    DOB: October 09, 1921, 80 y.o.   MRN: [id number]  HPI daughter noticed the blood last night and I used neosporin and I don't know if my mom hit it on anything and it was a dark color and  I would like to see if we could trim her nails    Review of Systems     Objective:   Physical Exam        Assessment & Plan:

## 2013-12-17 NOTE — Patient Instructions (Signed)

## 2013-12-26 ENCOUNTER — Telehealth: Payer: Self-pay | Admitting: Neurology

## 2013-12-26 DIAGNOSIS — G40219 Localization-related (focal) (partial) symptomatic epilepsy and epileptic syndromes with complex partial seizures, intractable, without status epilepticus: Secondary | ICD-10-CM

## 2013-12-26 NOTE — Telephone Encounter (Signed)
Pt's daughter Dondra SpryGail called is concerned with pt sleeping so much and wondered if it could be coming from the medication divalproex (DEPAKOTE ER) 500 MG 24 hr tablet. She states Dr. Valentina LucksGriffin did a test level from the medication and was going to send it to Dr. Pearlean BrownieSethi. She wanted to know if he recd this and what it could be. Please call Dondra SpryGail concerning this matter. Thanks

## 2013-12-27 NOTE — Telephone Encounter (Signed)
Spoke to Megan [name],  She stated pt saw Dr. Valentina [name] 2 wks ago.  Have not heard back about lab results.   She is asking relating to seizure meds.  Pt has had on and off periods of unresponsiveness, wanting to sleep.  Last time was time frame of 2 days. She did not know of med doses.  Will tell Dondra SpryGail who was not there and made the call.

## 2013-12-31 MED ORDER — DIVALPROEX SODIUM ER 500 MG PO TB24: 500.0000 mg | ORAL_TABLET | Freq: Two times a day (BID) | ORAL | Status: DC

## 2013-12-31 NOTE — Telephone Encounter (Signed)
Rx updated to BID and sent to the pharmacy.

## 2013-12-31 NOTE — Telephone Encounter (Signed)
[facility]  Pt had trough level at Dr. Kandyce RudGriffins, Valproic acid level 46.  Takes 250mg  am and 500mg  po pm.  Next f/u 01-25-14 and on waitlist.

## 2013-12-31 NOTE — Telephone Encounter (Signed)
Increase depakote to 500 mg twice daily and check level in 10 days

## 2013-12-31 NOTE — Telephone Encounter (Signed)
I called and spoke to caregiver, Dondra SpryGail.  Relayed the information and need for increasing dose of medication to Depakote 500mg  po bid.  She will need more.  Will refill. Lab to be repeated around 01-10-14 trough dose.  She relayed understanding.

## 2014-01-25 ENCOUNTER — Ambulatory Visit: Payer: Self-pay | Admitting: Nurse Practitioner

## 2014-02-27 ENCOUNTER — Encounter (INDEPENDENT_AMBULATORY_CARE_PROVIDER_SITE_OTHER): Payer: Self-pay

## 2014-02-27 ENCOUNTER — Ambulatory Visit (INDEPENDENT_AMBULATORY_CARE_PROVIDER_SITE_OTHER): Payer: Medicare Other | Admitting: Nurse Practitioner

## 2014-02-27 ENCOUNTER — Encounter: Payer: Self-pay | Admitting: Nurse Practitioner

## 2014-02-27 VITALS — BP 132/59 | HR 89 | Ht 61.0 in

## 2014-02-27 DIAGNOSIS — Z8673 Personal history of transient ischemic attack (TIA), and cerebral infarction without residual deficits: Secondary | ICD-10-CM

## 2014-02-27 DIAGNOSIS — G40219 Localization-related (focal) (partial) symptomatic epilepsy and epileptic syndromes with complex partial seizures, intractable, without status epilepticus: Secondary | ICD-10-CM

## 2014-02-27 DIAGNOSIS — F0150 Vascular dementia without behavioral disturbance: Secondary | ICD-10-CM

## 2014-02-27 MED ORDER — DIVALPROEX SODIUM ER 500 MG PO TB24: 500.0000 mg | ORAL_TABLET | Freq: Every day | ORAL | Status: DC

## 2014-02-27 NOTE — Patient Instructions (Signed)
Continue Depakote at night for seizure prevention.  Continue Clopidogrel daily for stroke prevention.  Follow up in 6 months, sooner as needed.

## 2014-02-27 NOTE — Progress Notes (Signed)
PATIENT: Megan [name] DOB: 06-Feb-1922  REASON FOR VISIT: routine follow up for hx of stroke, partial seizure, dementia HISTORY FROM: caretaker  HISTORY OF PRESENT ILLNESS: UPDATE 02/27/14 (LL):  Since last visit, patient has been stable.  She is accompanied today by her caregiver.  Depakote is being administered once daily at bedtime with resolution of unresponsive episodes. She stays awake most of the day with a nap in the afternoon and sleeps most of the night. Lately she wants to sleep in the recliner rather than the bed which has contributed to lower leg edema.  She is not ambulatory but is able to pivot. She is extremely hard-of-hearing. Blood pressure has been stable, it is 132/59 in the office today. She is tolerating Plavix well with no signs of significant bleeding or bruising.  09/13/13 (LL):  Patient's daughter called requesting acute visit.  She is concerned that the patient cannot sit up. She is leaning to the right. Symptoms are not stroke like. Patient can respond. She was taken to the hospital on 09/07/13 for tia workup which was negative.  Dr. Thad Rangereynolds increased her Depakote by 125 mg in the am, thinking periods of unresponsiveness and lethary was likely partial seizures. Daughter states that her mother sometimes turns her neck and head towards the right like she is uncomfortable, but when she asks her if anything is wrong, she replies "no".  Patient is becoming more deconditioned, cannot walk but a couple steps with assistance, but can still pivot from bed to chair.  Her appetite is very poor.  08/07/13 (PS):  89 year African American lady with prior pontine infarct in 2012 with spastic residual right hemiparesis with recurrent episodes of transient right-sided weakness as well as recent episodes of sleepiness and transient worsening of symptoms with multiple visits to the hospital with negative imaging studies as well as lab evaluation. She is seen today for followup after last visit  with me on 09/13/12. 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 She apparently was doing fine until 3 weeks ago when she was found to have episode of increasing right-sided weakness in: Speech and sleepiness. [facility] and [facility] for 8 days and had negative brain imaging studies including CT scan and MRI on 07/13/13 which I personally reviewed which did not reveal any acute stroke. CBC, UA and basic chemistry labs were also normal. Suicidal with shingles affecting the left forearm and was treated with a cycle rate. The daughter feels that she has not been the same since then. She has periods when she is quite sleepy and at times difficult to arouse and other times when she is more awake. Right-sided weakness seems more pronounced when she sleepy. She has seen her primary physician twice and has undergone lab work and CT scan which was negative. She was seen in the ER recently on 08/05/39 and with again negative lab work and CT scan and was given IV fluids and improved. She has also not been ambulating as much she requires one-person assist to walk with a walker and walks quite little. Her blood sugars have been reasonably well controlled with fasting glucoses being in the 130s and postprandials in the 140s. She had lipid profile checked 5 weeks ago which was fine hemoglobin A1c was 6.5. Patient was tried on Keppra for seizure trial but did not tolerate it due to drowsiness. The patient has also been found to have memory difficulties and has not been as interactive and  cognitively has declined for the last several months. She has never been on a trial of Aricept in the past. She had vitamin B12 checked on 07/21/13 which was low-normal at 257 and TSH was normal on 01/07/2012.  EEG was obtained on 08/08/13 with presence of left frontal and temporal spikes and sharp activity indicative of high risk for partial epilepsy. She was started on Depakote ER 500 mg at hs.  REVIEW OF SYSTEMS: Full  14 system review of systems performed and notable only for:  hearing loss, weakness   ALLERGIES: Allergies  Allergen Reactions  . Lipitor [Atorvastatin] Other (See Comments)    Myalgias     HOME MEDICATIONS: Outpatient Prescriptions Prior to Visit  Medication Sig Dispense Refill  . clopidogrel (PLAVIX) 75 MG tablet Take 1 tablet (75 mg total) by mouth daily with breakfast.  30 tablet  11  . metFORMIN (GLUCOPHAGE) 500 MG tablet Take 1,000 mg by mouth 2 (two) times daily with a meal.      . ONE TOUCH ULTRA TEST test strip       . pantoprazole (PROTONIX) 40 MG tablet Take 40 mg by mouth daily.       . rosuvastatin (CRESTOR) 5 MG tablet Take 5 mg by mouth every Monday, Wednesday, and Friday.      . divalproex (DEPAKOTE ER) 500 MG 24 hr tablet Take 1 tablet (500 mg total) by mouth 2 (two) times daily.  60 tablet  1  . divalproex (DEPAKOTE) 125 MG DR tablet Take 1 tablet (125 mg total) by mouth every morning.  90 tablet  2   No facility-administered medications prior to visit.    PHYSICAL EXAM Filed Vitals:   02/27/14 1033  BP: 132/59  Pulse: 89  Height: 5\' 1"  (1.549 m)   There is no weight on file to calculate BMI.  General: Frail elderly African American lady, seated, in no evident distress  Head: head normocephalic and atraumatic. Orohparynx benign  Neck: supple with no carotid or supraclavicular bruits  Cardiovascular: regular rate and rhythm, no murmurs, bilateral LE edema 2+ Musculoskeletal: no deformity. Mild kyphoscoliosis  Skin: no rash/petichiae  Vascular: Normal pulses all extremities  Neurologic Exam  Mental Status: Awake and fully alert. Disoriented to place and time. Recent and remote memory poor. Attention span, concentration and fund of knowledge diminished. Mood and affect appropriate. Pseudobulbar speech with dysarthria  Cranial Nerves: Pupils equal, briskly reactive to light. Extraocular movements full without nystagmus. Visual fields full to confrontation.  Facial sensation intact but weakness of her right lower face. Tongue, palate moves normally and symmetrically.  VERY HOH. Motor: Spastic right hemiplegia with 2/5 right upper extremity and 3/5 right lower extremity strength with distal greater than proximal weakness. Normal strength on the left side. Increased tone on the right with spasticity  Sensory: Diminished touch and pinprick and vibratory sensation on the right.  Coordination: Unable to follow commands to test. Gait and Station: Not tested as patient is in wheelchair. Reflexes: 1+ and asymmetric brisker on right.   ASSESSMENT: 27 year African American lady with remote history of left pontine stroke in 2012 due to small vessel disease with resultant right hemiparesis with several episodes of TIAs and possible partial complex seizures with recent episodes of transient sleepiness of unclear etiology with negative lab and imaging evaluation. 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  PLAN:  Continue current dose of Depakote ER, 500 mg in the pm for partial seizures. Continue Plavix for stroke prevention and strict control  of diabetes with hemoglobin A1c goal below 7% and lipids with LDL cholesterol goal below 70 mg percent.  Meds ordered this encounter  Medications  . divalproex (DEPAKOTE ER) 500 MG 24 hr tablet    Sig: Take 1 tablet (500 mg total) by mouth at bedtime.    Dispense:  90 tablet    Refill:  2    Order Specific Question:  Supervising Provider    Answer:  Delia HeadySETHI, PRAMOD [[id number]]   Return in about 6 months (around 08/30/2014) for stroke, dementia, seizure.  Ronal FearLYNN E. LAM, MSN, NP-C 02/27/2014, 12:19 PM [facility] [address], [address] [place], [place] [address] [phone]  Note: This document was prepared with digital dictation and possible smart phrase technology. Any transcriptional errors that result from this process are unintentional.

## 2014-04-02 ENCOUNTER — Other Ambulatory Visit: Payer: Self-pay | Admitting: Nurse Practitioner

## 2014-04-02 ENCOUNTER — Telehealth: Payer: Self-pay | Admitting: Neurology

## 2014-04-02 MED ORDER — MEMANTINE HCL ER 7 & 14 & 21 &28 MG PO CP24: 1.0000 | ORAL_CAPSULE | Freq: Every day | ORAL | Status: DC

## 2014-04-02 NOTE — Telephone Encounter (Signed)
Per Larita FifeLynn, since the patient  Has not any changes in her everyday activities, she did not need to be seen but we could start her on  Namenda XR starter pack.  Patient's daughter was notified and wants to try the Namenda if you think it will help her and wants to try the samples first and if she can tolerate would like a prescription sent to the pharmacy.  I explained to the patient's daughter to call the insurance office and see if they will cover the Namenda since it is expensive and she states that if she tolerates it , she will pay for it even if she has to do it out of her pocket.

## 2014-04-02 NOTE — Telephone Encounter (Signed)
Patient's daughter calling to state that patient is leaning and is unable to sit up, daughter requesting for advice, please return call and advise.

## 2014-04-02 NOTE — Telephone Encounter (Signed)
The Namenda Xr starter pack is at the front desk to be picked up and the patient's daughter was notified.  Patient was advised to give her weeks 1-3 out of the starter pack ( pack will expire end of this month)  and then start on the prescription Namenda XR 28 mg daily.  Patient's daughter verbalized understanding.

## 2014-04-02 NOTE — Progress Notes (Signed)
Patient's daughter requesting to try Namenda, we will give titration pack to see if Megan [name] tolerates it.  If tolerated, we will send in Namenda XR 28 mg Rx.

## 2014-04-02 NOTE — Telephone Encounter (Signed)
Patient's daughter states that she is leaning forward, not able to sit up , was worse and has gotten a little better today.  Patient is not taking any medication for this but at the last visit Dr. Pearlean BrownieSethi mentioned possibly starting her on Namenda.  Patient is still doing normal activities, reading the paper and her appetite is good, just concerned about the leaning.  Please advise.

## 2014-04-24 ENCOUNTER — Emergency Department (HOSPITAL_COMMUNITY): Payer: Medicare Other

## 2014-04-24 ENCOUNTER — Observation Stay (HOSPITAL_COMMUNITY)
Admission: EM | Admit: 2014-04-24 | Discharge: 2014-04-25 | Disposition: A | Discharge status: Home / Self Care | Payer: Medicare Other | Attending: Internal Medicine | Admitting: Internal Medicine

## 2014-04-24 ENCOUNTER — Encounter (HOSPITAL_COMMUNITY): Payer: Self-pay | Admitting: Emergency Medicine

## 2014-04-24 DIAGNOSIS — F0390 Unspecified dementia without behavioral disturbance: Secondary | ICD-10-CM | POA: Diagnosis not present

## 2014-04-24 DIAGNOSIS — J189 Pneumonia, unspecified organism: Secondary | ICD-10-CM

## 2014-04-24 DIAGNOSIS — R079 Chest pain, unspecified: Secondary | ICD-10-CM | POA: Diagnosis present

## 2014-04-24 DIAGNOSIS — R400 Somnolence: Secondary | ICD-10-CM

## 2014-04-24 DIAGNOSIS — F0280 Dementia in other diseases classified elsewhere without behavioral disturbance: Secondary | ICD-10-CM

## 2014-04-24 DIAGNOSIS — Z79899 Other long term (current) drug therapy: Secondary | ICD-10-CM | POA: Insufficient documentation

## 2014-04-24 DIAGNOSIS — E119 Type 2 diabetes mellitus without complications: Secondary | ICD-10-CM | POA: Insufficient documentation

## 2014-04-24 DIAGNOSIS — E785 Hyperlipidemia, unspecified: Secondary | ICD-10-CM | POA: Diagnosis not present

## 2014-04-24 DIAGNOSIS — B029 Zoster without complications: Secondary | ICD-10-CM

## 2014-04-24 DIAGNOSIS — Z7902 Long term (current) use of antithrombotics/antiplatelets: Secondary | ICD-10-CM | POA: Diagnosis not present

## 2014-04-24 DIAGNOSIS — Z8673 Personal history of transient ischemic attack (TIA), and cerebral infarction without residual deficits: Secondary | ICD-10-CM | POA: Insufficient documentation

## 2014-04-24 DIAGNOSIS — G40909 Epilepsy, unspecified, not intractable, without status epilepticus: Secondary | ICD-10-CM | POA: Insufficient documentation

## 2014-04-24 DIAGNOSIS — G9340 Encephalopathy, unspecified: Secondary | ICD-10-CM

## 2014-04-24 DIAGNOSIS — G8110 Spastic hemiplegia affecting unspecified side: Secondary | ICD-10-CM

## 2014-04-24 DIAGNOSIS — E43 Unspecified severe protein-calorie malnutrition: Secondary | ICD-10-CM

## 2014-04-24 DIAGNOSIS — G3183 Dementia with Lewy bodies: Secondary | ICD-10-CM

## 2014-04-24 DIAGNOSIS — F0150 Vascular dementia without behavioral disturbance: Secondary | ICD-10-CM

## 2014-04-24 DIAGNOSIS — R569 Unspecified convulsions: Secondary | ICD-10-CM

## 2014-04-24 LAB — CBC
HCT: 26.6 % — ABNORMAL LOW (ref 36.0–46.0)
HCT: 30.1 % — ABNORMAL LOW (ref 36.0–46.0)
HEMOGLOBIN: 9.6 g/dL — AB (ref 12.0–15.0)
Hemoglobin: 8.6 g/dL — ABNORMAL LOW (ref 12.0–15.0)
MCH: 27.6 pg (ref 26.0–34.0)
MCH: 26.9 pg (ref 26.0–34.0)
MCHC: 32.3 g/dL (ref 30.0–36.0)
MCHC: 31.9 g/dL (ref 30.0–36.0)
MCV: 85.3 fL (ref 78.0–100.0)
MCV: 84.3 fL (ref 78.0–100.0)
Platelets: 213 10*3/uL (ref 150–400)
Platelets: 225 10*3/uL (ref 150–400)
RBC: 3.12 MIL/uL — ABNORMAL LOW (ref 3.87–5.11)
RBC: 3.57 MIL/uL — AB (ref 3.87–5.11)
RDW: 15.6 % — ABNORMAL HIGH (ref 11.5–15.5)
RDW: 15.4 % (ref 11.5–15.5)
WBC: 5.8 10*3/uL (ref 4.0–10.5)
WBC: 5.7 10*3/uL (ref 4.0–10.5)

## 2014-04-24 LAB — URINALYSIS, ROUTINE W REFLEX MICROSCOPIC
Bilirubin Urine: NEGATIVE
Glucose, UA: 100 mg/dL — AB
HGB URINE DIPSTICK: NEGATIVE
KETONES UR: NEGATIVE mg/dL
Nitrite: NEGATIVE
Protein, ur: NEGATIVE mg/dL
SPECIFIC GRAVITY, URINE: 1.006 (ref 1.005–1.030)
Urobilinogen, UA: 0.2 mg/dL (ref 0.0–1.0)
pH: 7.5 (ref 5.0–8.0)

## 2014-04-24 LAB — BASIC METABOLIC PANEL
ANION GAP: 9 (ref 5–15)
BUN: 15 mg/dL (ref 6–23)
CHLORIDE: 101 meq/L (ref 96–112)
CO2: 27 mEq/L (ref 19–32)
CREATININE: 0.87 mg/dL (ref 0.50–1.10)
Calcium: 9.6 mg/dL (ref 8.4–10.5)
GFR calc Af Amer: 66 mL/min — ABNORMAL LOW (ref >90)
GFR calc non Af Amer: 57 mL/min — ABNORMAL LOW (ref >90)
Glucose, Bld: 171 mg/dL — ABNORMAL HIGH (ref 70–99)
Potassium: 5 mEq/L (ref 3.7–5.3)
Sodium: 137 mEq/L (ref 137–147)

## 2014-04-24 LAB — PROTIME-INR
INR: 1.09 (ref 0.00–1.49)
Prothrombin Time: 14.1 seconds (ref 11.6–15.2)

## 2014-04-24 LAB — GLUCOSE, CAPILLARY: GLUCOSE-CAPILLARY: 103 mg/dL — AB (ref 70–99)

## 2014-04-24 LAB — CREATININE, SERUM
CREATININE: 0.87 mg/dL (ref 0.50–1.10)
GFR calc non Af Amer: 57 mL/min — ABNORMAL LOW (ref >90)
GFR, EST AFRICAN AMERICAN: 66 mL/min — AB (ref >90)

## 2014-04-24 LAB — URINE MICROSCOPIC-ADD ON

## 2014-04-24 LAB — TROPONIN I: Troponin I: <0.3 ng/mL (ref <0.30)

## 2014-04-24 LAB — I-STAT TROPONIN, ED: Troponin i, poc: 0 ng/mL (ref 0.00–0.08)

## 2014-04-24 LAB — TSH: TSH: 1.12 u[IU]/mL (ref 0.350–4.500)

## 2014-04-24 MED ORDER — ROSUVASTATIN CALCIUM 5 MG PO TABS
5.0000 mg | ORAL_TABLET | ORAL | Status: DC
  Administered 2014-04-24: 5 mg via ORAL
  Filled 2014-04-24 (×2): qty 1

## 2014-04-24 MED ORDER — CLOPIDOGREL BISULFATE 75 MG PO TABS
75.0000 mg | ORAL_TABLET | Freq: Every day | ORAL | Status: DC
  Administered 2014-04-25: 75 mg via ORAL
  Filled 2014-04-24: qty 1

## 2014-04-24 MED ORDER — DIVALPROEX SODIUM ER 500 MG PO TB24
500.0000 mg | ORAL_TABLET | Freq: Every day | ORAL | Status: DC
  Administered 2014-04-24: 500 mg via ORAL
  Filled 2014-04-24 (×3): qty 1

## 2014-04-24 MED ORDER — INSULIN ASPART 100 UNIT/ML ~~LOC~~ SOLN: 0.0000 [IU] | SUBCUTANEOUS | Status: DC

## 2014-04-24 MED ORDER — NITROGLYCERIN 0.4 MG SL SUBL: 0.4000 mg | SUBLINGUAL_TABLET | SUBLINGUAL | Status: DC | PRN

## 2014-04-24 MED ORDER — HEPARIN SODIUM (PORCINE) 5000 UNIT/ML IJ SOLN
5000.0000 [IU] | Freq: Three times a day (TID) | INTRAMUSCULAR | Status: DC
Start: 2014-04-24 — End: 2014-04-25
  Administered 2014-04-24 – 2014-04-25 (×2): 5000 [IU] via SUBCUTANEOUS
  Filled 2014-04-24 (×5): qty 1

## 2014-04-24 MED ORDER — MEMANTINE HCL ER 7 MG PO CP24
7.0000 mg | ORAL_CAPSULE | Freq: Every day | ORAL | Status: DC
  Administered 2014-04-24: 7 mg via ORAL
  Filled 2014-04-24 (×2): qty 1

## 2014-04-24 MED ORDER — INSULIN ASPART 100 UNIT/ML ~~LOC~~ SOLN
0.0000 [IU] | Freq: Three times a day (TID) | SUBCUTANEOUS | Status: DC
  Administered 2014-04-25: 3 [IU] via SUBCUTANEOUS

## 2014-04-24 MED ORDER — ASPIRIN 325 MG PO TABS: 325.0000 mg | ORAL_TABLET | ORAL | Status: DC

## 2014-04-24 MED ORDER — ATORVASTATIN CALCIUM 10 MG PO TABS: 10.0000 mg | ORAL_TABLET | Freq: Every day | ORAL | Status: DC

## 2014-04-24 MED ORDER — SODIUM CHLORIDE 0.9 % IJ SOLN
3.0000 mL | Freq: Two times a day (BID) | INTRAMUSCULAR | Status: DC
  Administered 2014-04-24 – 2014-04-25 (×2): 3 mL via INTRAVENOUS

## 2014-04-24 MED ORDER — PANTOPRAZOLE SODIUM 40 MG PO TBEC
40.0000 mg | DELAYED_RELEASE_TABLET | Freq: Every morning | ORAL | Status: DC
  Administered 2014-04-25: 40 mg via ORAL
  Filled 2014-04-24: qty 1

## 2014-04-24 MED ORDER — SODIUM CHLORIDE 0.9 % IV SOLN
INTRAVENOUS | Status: DC
  Administered 2014-04-24 – 2014-04-25 (×2): via INTRAVENOUS

## 2014-04-24 MED ORDER — ASPIRIN EC 325 MG PO TBEC
325.0000 mg | DELAYED_RELEASE_TABLET | Freq: Every day | ORAL | Status: DC
  Administered 2014-04-24 – 2014-04-25 (×2): 325 mg via ORAL
  Filled 2014-04-24 (×2): qty 1

## 2014-04-24 NOTE — Progress Notes (Signed)
Pt admitted though the emergency department with daughter who is the primary caregiver at bedside. Upon assessment, a stage 2 old pressure ulcer which was present during her admission in November of 2014 was still present. A sacral merpilex was placed per protocol and patient will be turned q 2hrs

## 2014-04-24 NOTE — ED Provider Notes (Signed)
CSN: [id number]     Arrival date & time 04/24/14  1558 History   First MD Initiated Contact with Patient 04/24/14 1620     Chief Complaint  Patient presents with  . Chest Pain     Patient is a 80 y.o. female presenting with chest pain. The history is provided by the patient, a relative and a caregiver. The history is limited by the condition of the patient (Hx dementia).  Chest Pain Pt was seen at 1645. Per pt's caregiver and relatives: pt c/o gradual onset and persistence of constant mid-sternal chest "pain" that began at 1445 today. CP was associated with SOB. CP began while pt was standing and transferring (pt does not walk per baseline). CP lasted approximately 10-15 minutes before resolving with rest. CP resolved before EMS left pt's house. Pt's family states pt "isn't a complainer" and has not complained of CP previously. Pt has significant hx of dementia and currently denies complaints.    Past Medical History  Diagnosis Date  . Stroke   . Diabetes mellitus   . Hyperlipidemia   . Seizure    History reviewed. No pertinent past surgical history.  Family History  Problem Relation Age of Onset  . CAD Mother   . Hypertension Mother   . Diabetes Mother   . Stroke Mother    History  Substance Use Topics  . Smoking status: Never Smoker   . Smokeless tobacco: Former Neurosurgeon    Types: Snuff  . Alcohol Use: No    Review of Systems  Unable to perform ROS: Dementia  Cardiovascular: Positive for chest pain.     Allergies  Lipitor  Home Medications   Prior to Admission medications   Medication Sig Start Date End Date Taking? Authorizing Provider  clopidogrel (PLAVIX) 75 MG tablet Take 1 tablet (75 mg total) by mouth daily with breakfast. 08/15/13  Yes Lillia Mountain, MD  divalproex (DEPAKOTE ER) 500 MG 24 hr tablet Take 1 tablet (500 mg total) by mouth at bedtime. 02/27/14  Yes Ronal Fear, NP  Memantine HCl ER (NAMENDA XR) 7 MG CP24 Take 7 mg by mouth at bedtime.    Yes  Historical Provider, MD  metFORMIN (GLUCOPHAGE) 500 MG tablet Take 500 mg by mouth 2 (two) times daily with a meal.    Yes Historical Provider, MD  pantoprazole (PROTONIX) 40 MG tablet Take 40 mg by mouth every morning.    Yes Historical Provider, MD  rosuvastatin (CRESTOR) 5 MG tablet Take 5 mg by mouth every Monday, Wednesday, and Friday.   Yes Historical Provider, MD   BP 139/52  Pulse 78  Temp(Src) 98.3 F (36.8 C) (Oral)  Resp 16  SpO2 100% Physical Exam 1650: Physical examination:  Nursing notes reviewed; Vital signs and O2 SAT reviewed;  Constitutional: Well developed, Well nourished, In no acute distress; Head:  Normocephalic, atraumatic; Eyes: EOMI, PERRL, No scleral icterus; ENMT: Mouth and pharynx normal, Mucous membranes dry;; Neck: Supple, Full range of motion, No lymphadenopathy; Cardiovascular: Regular rate and rhythm, No gallop; Respiratory: Breath sounds clear & equal bilaterally, No wheezes. Normal respiratory effort/excursion; Chest: Nontender, Movement normal; Abdomen: Soft, Nontender, Nondistended, Normal bowel sounds; Genitourinary: No CVA tenderness; Extremities: Pulses normal, No tenderness, No edema, No calf edema or asymmetry.; Neuro: Awake, alert, confused per hx dementia. Major CN grossly intact.  Speech clear. No gross focal motor or sensory deficits in extremities.; Skin: Color normal, Warm, Dry.   ED Course  Procedures     EKG  Interpretation   Date/Time:  Wednesday April 24 2014 16:06:37 EDT Ventricular Rate:  84 PR Interval:  198 QRS Duration: 72 QT Interval:  357 QTC Calculation: 422 R Axis:   15 Text Interpretation:  Sinus rhythm Low voltage, extremity and precordial  leads Baseline wander When compared with ECG of 09/07/2013 No significant  change was found Confirmed by [facility]  MD, Nicholos Johns [phone]) on 04/24/2014  4:20:14 PM      MDM  MDM Reviewed: previous chart, nursing note and vitals Reviewed previous: labs and ECG Interpretation: labs  and x-ray    Results for orders placed during the hospital encounter of 04/24/14  CBC      Result Value Ref Range   WBC 5.8  4.0 - 10.5 K/uL   RBC 3.12 (*) 3.87 - 5.11 MIL/uL   Hemoglobin 8.6 (*) 12.0 - 15.0 g/dL   HCT 60.4 (*) 54.0 - 98.1 %   MCV 85.3  78.0 - 100.0 fL   MCH 27.6  26.0 - 34.0 pg   MCHC 32.3  30.0 - 36.0 g/dL   RDW 19.1 (*) 47.8 - 29.5 %   Platelets 213  150 - 400 K/uL  BASIC METABOLIC PANEL      Result Value Ref Range   Sodium 137  137 - 147 mEq/L   Potassium 5.0  3.7 - 5.3 mEq/L   Chloride 101  96 - 112 mEq/L   CO2 27  19 - 32 mEq/L   Glucose, Bld 171 (*) 70 - 99 mg/dL   BUN 15  6 - 23 mg/dL   Creatinine, Ser 6.21  0.50 - 1.10 mg/dL   Calcium 9.6  8.4 - 30.8 mg/dL   GFR calc non Af Amer 57 (*) >90 mL/min   GFR calc Af Amer 66 (*) >90 mL/min   Anion gap 9  5 - 15  PROTIME-INR      Result Value Ref Range   Prothrombin Time 14.1  11.6 - 15.2 seconds   INR 1.09  0.00 - 1.49  URINALYSIS, ROUTINE W REFLEX MICROSCOPIC      Result Value Ref Range   Color, Urine YELLOW  YELLOW   APPearance CLEAR  CLEAR   Specific Gravity, Urine 1.006  1.005 - 1.030   pH 7.5  5.0 - 8.0   Glucose, UA 100 (*) NEGATIVE mg/dL   Hgb urine dipstick NEGATIVE  NEGATIVE   Bilirubin Urine NEGATIVE  NEGATIVE   Ketones, ur NEGATIVE  NEGATIVE mg/dL   Protein, ur NEGATIVE  NEGATIVE mg/dL   Urobilinogen, UA 0.2  0.0 - 1.0 mg/dL   Nitrite NEGATIVE  NEGATIVE   Leukocytes, UA MODERATE (*) NEGATIVE  URINE MICROSCOPIC-ADD ON      Result Value Ref Range   Squamous Epithelial / LPF RARE  RARE   WBC, UA 11-20  <3 WBC/hpf   Bacteria, UA RARE  RARE  I-STAT TROPOININ, ED      Result Value Ref Range   Troponin i, poc 0.00  0.00 - 0.08 ng/mL   Comment 3            Dg Chest Port 1 View 04/24/2014   CLINICAL DATA:  Chest pain  EXAM: PORTABLE CHEST - 1 VIEW  COMPARISON:  08/04/2013  FINDINGS: Heart size appears normal. There is no pleural effusion. Bibasilar opacities are identified left greater  night. Upper lobes appear clear. Calcified atherosclerotic plaque is noted within the thoracic aorta.  IMPRESSION: Bibasilar airspace opacities, left greater night.  Atherosclerotic disease.  Electronically Signed   By: Signa Kell M.D.   On: 04/24/2014 17:14    1835:  Pt denies symptoms while in the ED. Dx and testing d/w pt's family.  Questions answered.  Verb understanding, agreeable to admit. T/C to [facility] Dr. Alvester Morin, case discussed, including:  HPI, pertinent PM/SHx, VS/PE, dx testing, ED course and treatment:  Agreeable to admit, requests to write temporary orders, obtain observation tele bed to team 10.   Samuel Jester, DO 04/26/14 2010

## 2014-04-24 NOTE — H&P (Signed)
Hospitalist Admission History and Physical  Patient name: Megan [name] Medical record number: [id number] Date of birth: 11/02/1921 Age: 80 y.o. Gender: female  Primary Care Provider: Lillia Mountain, MD  Chief Complaint: Chest Pain  History of Present Illness:This is a 80 y.o. year old female with significant past medical history of stroke, post CVA stroke, vascular dementia, HTN, HLD, NIDDM presenting with chest pain. Per the family, Megan [name] was eating earlier today when she complained of generalized moderate anterior chest pain. History primarily obtained from daughter. Megan [name] minimally verbal. Megan [name] had some mild SOB with sxs per daughter. EMS was called. On arrival, Megan [name] was chest pain free and did not receive aspirin or NTG. Sxs lasted for approx 15 mins. No prior hx/o CAD in the past per daughter. Denies any recent falls, head trauma, nausea, vomiting, diarrhea, fever or chills.  In the ER, Megan [name] afebrile, hemodynamically stable. Satting >97% on RA.CBC and BMET essentially WNL apart from hgb 8.6. Trop neg x1. EKG NSR. CXR WNL.    Assessment and Plan: Megan [name] is a 80 y.o. year old female presenting with chest pain   Active Problems:   Chest pain   1-Chest pain  -Somewhat typical sxs in Megan [name] with multiple CV risk factors -no active CP currently  -Full dose ASA  -cycle CEs  -risk stratification labs  -tele bed  -consider outpt cards f/u vs. inpt c/s   2-CVA- -No reports of stroke sxs recurrence  -continue plavix   3-Seizure d/o  -stable  -continue depakote  4-DM -SSI, A1C   FEN/GI: heart healthy, carb modified diet  Prophylaxis: sub q heparin  Disposition: pending further evaluation  Code Status:Full Code    Patient Active Problem List   Diagnosis Date Noted  . Chest pain 04/24/2014  . Acute encephalopathy 08/09/2013  . Altered mental status 08/08/2013  . Dementia with Lewy bodies 08/07/2013  . Vascular dementia 08/07/2013  . Sleepiness 08/07/2013  . Anemia 07/21/2013  .  Protein-calorie malnutrition, severe 07/15/2013  . Herpes zoster 07/13/2013  . Spastic hemiplegia affecting dominant side 06/25/2013  . CVA (cerebral infarction) 09/03/2012  . Seizure   . TIA (transient ischemic attack) 01/06/2012  . Community acquired pneumonia 01/06/2012  . Diabetes mellitus 01/06/2012  . HTN (hypertension) 01/06/2012  . H/O 01/06/2012   Past Medical History: Past Medical History  Diagnosis Date  . Stroke   . Diabetes mellitus   . Hyperlipidemia   . Seizure     Past Surgical History: History reviewed. No pertinent past surgical history.  Social History: History   Social History  . Marital Status: Widowed    Spouse Name: N/A    Number of Children: 5  . Years of Education: 12   Social History Main Topics  . Smoking status: Never Smoker   . Smokeless tobacco: Former Neurosurgeon    Types: Snuff  . Alcohol Use: No  . Drug Use: No  . Sexual Activity: None   Other Topics Concern  . None   Social History Narrative   Patient is widowed with 5 children   Patient is right handed   Patient has a high school education   Patient drinks 2 cups daily    Family History: Family History  Problem Relation Age of Onset  . CAD Mother   . Hypertension Mother   . Diabetes Mother   . Stroke Mother     Allergies: Allergies  Allergen Reactions  . Lipitor [Atorvastatin] Other (See Comments)    Myalgias  Current Facility-Administered Medications  Medication Dose Route Frequency Provider Last Rate Last Dose  . 0.9 %  sodium chloride infusion   Intravenous Continuous Doree Albee, MD      . aspirin EC tablet 325 mg  325 mg Oral Daily Doree Albee, MD      . Melene Muller ON 04/25/2014] atorvastatin (LIPITOR) tablet 10 mg  10 mg Oral q1800 Doree Albee, MD      . Melene Muller ON 04/25/2014] clopidogrel (PLAVIX) tablet 75 mg  75 mg Oral Q breakfast Doree Albee, MD      . divalproex (DEPAKOTE ER) 24 hr tablet 500 mg  500 mg Oral QHS Doree Albee, MD      . heparin injection  5,000 Units  5,000 Units Subcutaneous 3 times per day Doree Albee, MD      . insulin aspart (novoLOG) injection 0-9 Units  0-9 Units Subcutaneous 6 times per day Doree Albee, MD      . Memantine HCl ER CP24 7 mg  7 mg Oral QHS Doree Albee, MD      . nitroGLYCERIN (NITROSTAT) SL tablet 0.4 mg  0.4 mg Sublingual Q5 min PRN Samuel Jester, DO      . Melene Muller ON 04/25/2014] pantoprazole (PROTONIX) EC tablet 40 mg  40 mg Oral q morning - 10a Doree Albee, MD      . sodium chloride 0.9 % injection 3 mL  3 mL Intravenous Q12H Doree Albee, MD       Current Outpatient Prescriptions  Medication Sig Dispense Refill  . clopidogrel (PLAVIX) 75 MG tablet Take 1 tablet (75 mg total) by mouth daily with breakfast.  30 tablet  11  . divalproex (DEPAKOTE ER) 500 MG 24 hr tablet Take 1 tablet (500 mg total) by mouth at bedtime.  90 tablet  2  . Memantine HCl ER (NAMENDA XR) 7 MG CP24 Take 7 mg by mouth at bedtime.       . metFORMIN (GLUCOPHAGE) 500 MG tablet Take 500 mg by mouth 2 (two) times daily with a meal.       . pantoprazole (PROTONIX) 40 MG tablet Take 40 mg by mouth every morning.       . rosuvastatin (CRESTOR) 5 MG tablet Take 5 mg by mouth every Monday, Wednesday, and Friday.       Review Of Systems: 12 point ROS negative except as noted above in HPI.  Physical Exam: Filed Vitals:   04/24/14 1800  BP: 124/51  Pulse: 71  Temp:   Resp: 10    General: cooperative and minimally-mildly verbal, hard of hearing  HEENT: PERRLA and extra ocular movement intact Heart: S1, S2 normal, no murmur, rub or gallop, regular rate and rhythm Lungs: clear to auscultation, no wheezes or rales and unlabored breathing Abdomen: abdomen is soft without significant tenderness, masses, organomegaly or guarding Extremities: 2+ peripheral pulses, trace edema bilaterally, no popliteal tenderness Skin:no rashes, no ecchymoses Neurology: normal without focal findings  Labs and Imaging: Lab Results  Component  Value Date/Time   NA 137 04/24/2014  4:12 PM   K 5.0 04/24/2014  4:12 PM   CL 101 04/24/2014  4:12 PM   CO2 27 04/24/2014  4:12 PM   BUN 15 04/24/2014  4:12 PM   CREATININE 0.87 04/24/2014  4:12 PM   GLUCOSE 171* 04/24/2014  4:12 PM   Lab Results  Component Value Date   WBC 5.8 04/24/2014   HGB 8.6* 04/24/2014   HCT 26.6* 04/24/2014   MCV 85.3 04/24/2014  PLT 213 04/24/2014    Dg Chest Port 1 View  04/24/2014   CLINICAL DATA:  Chest pain  EXAM: PORTABLE CHEST - 1 VIEW  COMPARISON:  08/04/2013  FINDINGS: Heart size appears normal. There is no pleural effusion. Bibasilar opacities are identified left greater night. Upper lobes appear clear. Calcified atherosclerotic plaque is noted within the thoracic aorta.  IMPRESSION: Bibasilar airspace opacities, left greater night.  Atherosclerotic disease.   Electronically Signed   By: Signa Kell M.D.   On: 04/24/2014 17:14           Doree Albee MD  Pager: [phone]

## 2014-04-24 NOTE — ED Notes (Signed)
Attempted report 

## 2014-04-24 NOTE — ED Notes (Signed)
Pt c/o mid non radiating CP onset today 14:45, pt denies SOB, N/V/D, lasting 10 mins, pt denies pain upon EMS arrival & upon arrival to ED, pt A&O x4, follows commands, speaks in complete sentences with difficulty understanding words at baseline, pt pain free upon arrival to ED, NSR in route

## 2014-04-25 DIAGNOSIS — G3183 Dementia with Lewy bodies: Secondary | ICD-10-CM

## 2014-04-25 DIAGNOSIS — F0150 Vascular dementia without behavioral disturbance: Secondary | ICD-10-CM

## 2014-04-25 DIAGNOSIS — R079 Chest pain, unspecified: Secondary | ICD-10-CM

## 2014-04-25 DIAGNOSIS — Z8673 Personal history of transient ischemic attack (TIA), and cerebral infarction without residual deficits: Secondary | ICD-10-CM

## 2014-04-25 DIAGNOSIS — F0280 Dementia in other diseases classified elsewhere without behavioral disturbance: Secondary | ICD-10-CM

## 2014-04-25 LAB — CBC WITH DIFFERENTIAL/PLATELET
Basophils Absolute: 0 10*3/uL (ref 0.0–0.1)
Basophils Relative: 0 % (ref 0–1)
EOS ABS: 0.1 10*3/uL (ref 0.0–0.7)
Eosinophils Relative: 2 % (ref 0–5)
HCT: 27.9 % — ABNORMAL LOW (ref 36.0–46.0)
HEMOGLOBIN: 8.9 g/dL — AB (ref 12.0–15.0)
LYMPHS ABS: 2.5 10*3/uL (ref 0.7–4.0)
Lymphocytes Relative: 39 % (ref 12–46)
MCH: 26.9 pg (ref 26.0–34.0)
MCHC: 31.9 g/dL (ref 30.0–36.0)
MCV: 84.3 fL (ref 78.0–100.0)
MONOS PCT: 9 % (ref 3–12)
Monocytes Absolute: 0.6 10*3/uL (ref 0.1–1.0)
NEUTROS ABS: 3.2 10*3/uL (ref 1.7–7.7)
Neutrophils Relative %: 50 % (ref 43–77)
Platelets: 219 10*3/uL (ref 150–400)
RBC: 3.31 MIL/uL — AB (ref 3.87–5.11)
RDW: 15.4 % (ref 11.5–15.5)
WBC: 6.3 10*3/uL (ref 4.0–10.5)

## 2014-04-25 LAB — COMPREHENSIVE METABOLIC PANEL
ALK PHOS: 64 U/L (ref 39–117)
AST: 10 U/L (ref 0–37)
Albumin: 2.5 g/dL — ABNORMAL LOW (ref 3.5–5.2)
Anion gap: 9 (ref 5–15)
BUN: 16 mg/dL (ref 6–23)
CO2: 27 mEq/L (ref 19–32)
Calcium: 9.5 mg/dL (ref 8.4–10.5)
Chloride: 103 mEq/L (ref 96–112)
Creatinine, Ser: 0.84 mg/dL (ref 0.50–1.10)
GFR calc Af Amer: 69 mL/min — ABNORMAL LOW (ref >90)
GFR calc non Af Amer: 59 mL/min — ABNORMAL LOW (ref >90)
Glucose, Bld: 217 mg/dL — ABNORMAL HIGH (ref 70–99)
POTASSIUM: 4.6 meq/L (ref 3.7–5.3)
SODIUM: 139 meq/L (ref 137–147)
Total Bilirubin: <0.2 mg/dL — ABNORMAL LOW (ref 0.3–1.2)
Total Protein: 6.3 g/dL (ref 6.0–8.3)

## 2014-04-25 LAB — URINE CULTURE

## 2014-04-25 LAB — GLUCOSE, CAPILLARY
GLUCOSE-CAPILLARY: 238 mg/dL — AB (ref 70–99)
Glucose-Capillary: 166 mg/dL — ABNORMAL HIGH (ref 70–99)

## 2014-04-25 LAB — TROPONIN I: Troponin I: <0.3 ng/mL (ref <0.30)

## 2014-04-25 LAB — HEMOGLOBIN A1C
HEMOGLOBIN A1C: 7.6 % — AB (ref <5.7)
MEAN PLASMA GLUCOSE: 171 mg/dL — AB (ref <117)

## 2014-04-25 MED ORDER — ASPIRIN 325 MG PO TBEC: 325.0000 mg | DELAYED_RELEASE_TABLET | Freq: Every day | ORAL | Status: DC

## 2014-04-25 NOTE — Progress Notes (Signed)
Responded to request to assist patient and family with preparing advance directives. Copies were made by patients' nurse one for file and one seconday agent. The original was given to patient daughter.  During visit chaplain provided prayer to patient and empathetic listening to daughter at bedside.  Patient is schedule for discharge today.  04/25/14 1200  Clinical Encounter Type  Visited With Patient and family together;Health care provider  Visit Type Initial;Spiritual support;Other (Comment)  Referral From Family;Nurse (Assist with Advance Directive)  Spiritual Encounters  Spiritual Needs Prayer;Emotional  Stress Factors  Patient Stress Factors None identified  Family Stress Factors None identified  Advance Directives (For Healthcare)  Does patient have an advance directive? Yes  Type of Estate agent of [place];Living will  Fae Pippin, pager [phone]

## 2014-04-25 NOTE — Discharge Summary (Signed)
Physician Discharge Summary  Megan [name] ZOX:[id number] DOB: June 13, 1922 DOA: 04/24/2014  PCP: Lillia Mountain, MD  Admit date: 04/24/2014 Discharge date: 04/25/2014  Time spent: 35 minutes  Recommendations for Outpatient Follow-up:  1. Follow up with PCP in 2-4 weeks.  Discharge Diagnoses:  Active Problems:   Chest pain   Discharge Condition: guarded  Diet recommendation: heart healthy  Filed Weights   04/24/14 2032  Weight: 55.838 kg (123 lb 1.6 oz)    History of present illness:  80 y.o. year old female with significant past medical history of stroke, post CVA stroke, vascular dementia, HTN, HLD, NIDDM presenting with chest pain. Per the family, pt was eating earlier today when she complained of generalized moderate anterior chest pain. History primarily obtained from daughter. Pt minimally verbal. Pt had some mild SOB with sxs per daughter. EMS was called. On arrival, pt was chest pain free and did not receive aspirin or NTG. Sxs lasted for approx 15 mins. No prior hx/o CAD in the past per daughter. Denies any recent falls, head trauma, nausea, vomiting, diarrhea, fever or chills.  In the ER, pt afebrile, hemodynamically stable.    Hospital Course:  Chest pain: - Atypical sxs in pt with multiple CV risk factors  - cardiac markers negative x 3. - no events on telemetry - Could have been precipitated by eating/GERD.  - EKG unremarkable, troponins normal, chest x-ray shows no evidence of pleural effusions. - Cardiology consulted recommended medical management. Plavix as well as Crestor, aggressive secondary prevention.   Anemia: - unchanged from previous follow up with Dr Valentina Lucks. -  Has close followup with Dr. Valentina Lucks.   GERD: -continue with proton pump inhibitor.   Aortic atherosclerosis-statin, hypertension control.  Procedures:  CXR  Consultations:  cardiology  Discharge Exam: Filed Vitals:   04/24/14 2032  BP: 156/63  Pulse: 87  Temp: 97.8 F (36.6 C)   Resp: 18    General: A&O x3 Cardiovascular: RRR Respiratory: good air movement CTA B/L  Discharge Instructions You were cared for by a hospitalist during your hospital stay. If you have any questions about your discharge medications or the care you received while you were in the hospital after you are discharged, you can call the unit and asked to speak with the hospitalist on call if the hospitalist that took care of you is not available. Once you are discharged, your primary care physician will handle any further medical issues. Please note that NO REFILLS for any discharge medications will be authorized once you are discharged, as it is imperative that you return to your primary care physician (or establish a relationship with a primary care physician if you do not have one) for your aftercare needs so that they can reassess your need for medications and monitor your lab values.  Discharge Instructions   Diet - low sodium heart healthy    Complete by:  As directed      Increase activity slowly    Complete by:  As directed           Current Discharge Medication List    START taking these medications   Details  aspirin EC 325 MG EC tablet Take 1 tablet (325 mg total) by mouth daily. Qty: 30 tablet, Refills: 0      CONTINUE these medications which have NOT CHANGED   Details  clopidogrel (PLAVIX) 75 MG tablet Take 1 tablet (75 mg total) by mouth daily with breakfast. Qty: 30 tablet, Refills: 11    divalproex (  DEPAKOTE ER) 500 MG 24 hr tablet Take 1 tablet (500 mg total) by mouth at bedtime. 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     metFORMIN (GLUCOPHAGE) 500 MG tablet Take 500 mg by mouth 2 (two) times daily with a meal.     pantoprazole (PROTONIX) 40 MG tablet  Take 40 mg by mouth every morning.     rosuvastatin (CRESTOR) 5 MG tablet Take 5 mg by mouth every Monday, Wednesday, and Friday.       Allergies  Allergen Reactions  . Lipitor [Atorvastatin] Other (See Comments)    Myalgias    Follow-up Information   Follow up with Lillia Mountain, MD In 2 weeks. (hospital follow up)    Specialty:  Internal Medicine   Contact information:   [address] [place] [address] [phone]        The results of significant diagnostics from this hospitalization (including imaging, microbiology, ancillary and laboratory) are listed below for reference.    Significant Diagnostic Studies: Dg Chest Port 1 View  04/24/2014   CLINICAL DATA:  Chest pain  EXAM: PORTABLE CHEST - 1 VIEW  COMPARISON:  08/04/2013  FINDINGS: Heart size appears normal. There is no pleural effusion. Bibasilar opacities are identified left greater night. Upper lobes appear clear. Calcified atherosclerotic plaque is noted within the thoracic aorta.  IMPRESSION: Bibasilar airspace opacities, left greater night.  Atherosclerotic disease.   Electronically Signed   By: Signa Kell M.D.   On: 04/24/2014 17:14    Microbiology: No results found for this or any previous visit (from the past 240 hour(s)).   Labs: Basic Metabolic Panel:  Recent Labs Lab 04/24/14 1612 04/24/14 1957 04/25/14 0101  NA 137  --  139  K 5.0  --  4.6  CL 101  --  103  CO2 27  --  27  GLUCOSE 171*  --  217*  BUN 15  --  16  CREATININE 0.87 0.87 0.84  CALCIUM 9.6  --  9.5   Liver Function Tests:  Recent Labs Lab 04/25/14 0101  AST 10  ALT <5  ALKPHOS 64  BILITOT <0.2*  PROT 6.3  ALBUMIN 2.5*   No results found for this basename: LIPASE, AMYLASE,  in the last 168 hours No results found for this basename: AMMONIA,  in the last 168 hours CBC:  Recent Labs Lab 04/24/14 1612 04/24/14 1957 04/25/14 0101  WBC 5.8 5.7 6.3  NEUTROABS  --   --  3.2  HGB 8.6* 9.6*  8.9*  HCT 26.6* 30.1* 27.9*  MCV 85.3 84.3 84.3  PLT 213 225 219   Cardiac Enzymes:  Recent Labs Lab 04/24/14 1613 04/24/14 1957 04/25/14 0101  TROPONINI <0.30 <0.30 <0.30   BNP: BNP (last 3 results) No results found for this basename: PROBNP,  in the last 8760 hours CBG:  Recent Labs Lab 04/24/14 2129 04/25/14 0808  GLUCAP 103* 166*     Signed:  FELIZ ORTIZ, ABRAHAM  [facility] 04/25/2014, 11:00 AM

## 2014-04-25 NOTE — Progress Notes (Signed)
UR Completed.  [phone]  

## 2014-04-25 NOTE — Consult Note (Signed)
Admit date: 04/24/2014 Referring Physician  Dr. Alvester Morin Primary Physician Lillia Mountain, MD Primary Cardiologist : None Reason for Consultation  Chest pain  HPI: 80 year old female with dementia, prior history of stroke, hypertension, diabetes, hyperlipidemia who presented originally with chest discomfort on 04/24/14. History was obtained primarily from her daughter. She was eating yesterday and complained of generalized moderate anterior chest pain. Perhaps some mild shortness of breath was noted. EMS was called and they suggested proceeding to the emergency room to be sure that she was okay. When she arrived to the emergency room she was chest pain-free. The duration of symptoms or 15 minutes. No prior history of cardiovascular disease. Troponin has been normal. EKG unremarkable. No ST segment changes. Chest x-ray unremarkable. Currently chest pain-free.    PMH:   Past Medical History  Diagnosis Date  . Stroke   . Diabetes mellitus   . Hyperlipidemia   . Seizure     PSH:  History reviewed. No pertinent past surgical history. Allergies:  Lipitor Prior to Admit Meds:   Prior to Admission medications   Medication Sig Start Date End Date Taking? Authorizing Provider  clopidogrel (PLAVIX) 75 MG tablet Take 1 tablet (75 mg total) by mouth daily with breakfast. 08/15/13  Yes Lillia Mountain, MD  divalproex (DEPAKOTE ER) 500 MG 24 hr tablet Take 1 tablet (500 mg total) by mouth at bedtime. 02/27/14  Yes Ronal Fear, NP  Memantine HCl ER (NAMENDA XR) 7 MG CP24 Take 7 mg by mouth at bedtime.    Yes Historical Provider, MD  metFORMIN (GLUCOPHAGE) 500 MG tablet Take 500 mg by mouth 2 (two) times daily with a meal.    Yes Historical Provider, MD  pantoprazole (PROTONIX) 40 MG tablet Take 40 mg by mouth every morning.    Yes Historical Provider, MD  rosuvastatin (CRESTOR) 5 MG tablet Take 5 mg by mouth every Monday, Wednesday, and Friday.   Yes Historical Provider, MD   Fam HX:    Family  History  Problem Relation Age of Onset  . CAD Mother   . Hypertension Mother   . Diabetes Mother   . Stroke Mother    Social HX:    History   Social History  . Marital Status: Widowed    Spouse Name: N/A    Number of Children: 5  . Years of Education: 12   Occupational History  . Not on file.   Social History Main Topics  . Smoking status: Never Smoker   . Smokeless tobacco: Former Neurosurgeon    Types: Snuff  . Alcohol Use: No  . Drug Use: No  . Sexual Activity: Not on file   Other Topics Concern  . Not on file   Social History Narrative   Patient is widowed with 5 children   Patient is right handed   Patient has a high school education   Patient drinks 2 cups daily     ROS:  Denies any fevers, chills, orthopnea, PND, nausea, vomiting, diarrhea, falls (from her daughter's history). All 11 ROS were addressed and are negative except what is stated in the HPI   Physical Exam: Blood pressure 156/63, pulse 87, temperature 97.8 F (36.6 C), temperature source Oral, resp. rate 18, height  (1.575 m), weight 123 lb 1.6 oz (55.838 kg), SpO2 100.00%.   General: Elderly, in no acute distress, noncommunicative Head: Eyes PERRLA, No xanthomas.   Normal cephalic and atramatic  Lungs:   Clear bilaterally to  auscultation and percussion. Normal respiratory effort. No wheezes, no rales. Heart:   HRRR S1 S2 Pulses are 2+ & equal. No murmur, rubs, gallops.  No carotid bruit. No JVD.  No abdominal bruits.  Abdomen: Bowel sounds are positive, abdomen soft and non-tender without masses. No hepatosplenomegaly. Msk:  Back normal. Normal strength and tone for age. Extremities:  No clubbing, cyanosis or edema.  DP +1 Neuro: Alert, non-focal, MAE x 4 GU: Deferred Rectal: Deferred Psych:  Baseline vascular dementia      Labs: Lab Results  Component Value Date   WBC 6.3 04/25/2014   HGB 8.9* 04/25/2014   HCT 27.9* 04/25/2014   MCV 84.3 04/25/2014   PLT 219 04/25/2014     Recent Labs Lab  04/25/14 0101  NA 139  K 4.6  CL 103  CO2 27  BUN 16  CREATININE 0.84  CALCIUM 9.5  PROT 6.3  BILITOT <0.2*  ALKPHOS 64  ALT <5  AST 10  GLUCOSE 217*    Recent Labs  04/24/14 1613 04/24/14 1957 04/25/14 0101  TROPONINI <0.30 <0.30 <0.30   Lab Results  Component Value Date   CHOL 93 07/14/2013   HDL 37* 07/14/2013   LDLCALC 44 07/14/2013   TRIG 60 07/14/2013   No results found for this basename: DDIMER     Radiology:  Dg Chest Port 1 View  04/24/2014   CLINICAL DATA:  Chest pain  EXAM: PORTABLE CHEST - 1 VIEW  COMPARISON:  08/04/2013  FINDINGS: Heart size appears normal. There is no pleural effusion. Bibasilar opacities are identified left greater night. Upper lobes appear clear. Calcified atherosclerotic plaque is noted within the thoracic aorta.  IMPRESSION: Bibasilar airspace opacities, left greater night.  Atherosclerotic disease.   Electronically Signed   By: Signa Kell M.D.   On: 04/24/2014 17:14   Personally viewed.  EKG:  Normal sinus rhythm with no ST segment changes, heart rate 84 beats per minute, no significant change from prior. Personally viewed.   Echocardiogram 07/14/13-ejection fraction 60%, no wall motion abnormalities. grade 1 diastolic dysfunction trivial AI, mild MR, pulmonary pressures mildly elevated.  ASSESSMENT/PLAN:    80 year old female with vascular dementia, prior stroke, hyperlipidemia, hypertension, diabetes, mild secondary pulmonary hypertension, grade 1 diastolic dysfunction, aortic atherosclerotic disease noted on chest x-ray who initially presented with chest pain.  1. Chest pain-fairly atypical by history gathered mostly from daughter. Could have been precipitated by eating/GERD. EKG unremarkable, troponins normal, chest x-ray shows no evidence of pleural effusions, bi-basilar opacities were identified. It is certainly possible that given her advanced age and multiple cardiac risk factors that she does in fact have coronary artery  disease however she is being treated with both Plavix as well as Crestor, aggressive secondary prevention. Given her underlying vascular dementia, advanced age, I would continue with medical management with no further invasive or noninvasive testing. Recent echocardiogram in the setting of prior stroke and 2014 was reassuring with no wall motion abnormality, normal EF. Discussed with daughter.  2. Anemia-per primary team. Has close followup with Dr. Valentina Lucks.  3. Vascular dementia-per primary team.  4. GERD-continue with proton pump inhibitor.  5. Aortic atherosclerosis-statin, hypertension control.  Okay for discharge from a cardiac standpoint. No further cardiology followup necessary. She actually had an appointment with Dr. Valentina Lucks today. Her daughter will be rescheduling.  Donato Schultz, MD  04/25/2014  7:25 AM

## 2014-04-25 NOTE — Discharge Instructions (Signed)
Chest Pain Observation  It is often hard to give a specific diagnosis for the cause of chest pain. Among other possibilities your symptoms might be caused by inadequate oxygen delivery to your heart (angina). Angina that is not treated or evaluated can lead to a heart attack (myocardial infarction) or death.  Blood tests, electrocardiograms, and X-rays may have been done to help determine a possible cause of your chest pain. After evaluation and observation, your health care provider has determined that it is unlikely your pain was caused by an unstable condition that requires hospitalization. However, a full evaluation of your pain may need to be completed, with additional diagnostic testing as directed. It is very important to keep your follow-up appointments. Not keeping your follow-up appointments could result in permanent heart damage, disability, or death. If there is any problem keeping your follow-up appointments, you must call your health care provider.  HOME CARE INSTRUCTIONS   Due to the slight chance that your pain could be angina, it is important to follow your health care provider's treatment plan and also maintain a healthy lifestyle:  · Maintain or work toward achieving a healthy weight.  · Stay physically active and exercise regularly.  · Decrease your salt intake.  · Eat a balanced, healthy diet. Talk to a dietitian to learn about heart-healthy foods.  · Increase your fiber intake by including whole grains, vegetables, fruits, and nuts in your diet.  · Avoid situations that cause stress, anger, or depression.  · Take medicines as advised by your health care provider. Report any side effects to your health care provider. Do not stop medicines or adjust the dosages on your own.  · Quit smoking. Do not use nicotine patches or gum until you check with your health care provider.  · Keep your blood pressure, blood sugar, and cholesterol levels within normal limits.  · Limit alcohol intake to no more than  1 drink per day for women who are not pregnant and 2 drinks per day for men.  · Do not abuse drugs.  SEEK IMMEDIATE MEDICAL CARE IF:  You have severe chest pain or pressure which may include symptoms such as:  · You feel pain or pressure in your arms, neck, jaw, or back.  · You have severe back or abdominal pain, feel sick to your stomach (nauseous), or throw up (vomit).  · You are sweating profusely.  · You are having a fast or irregular heartbeat.  · You feel short of breath while at rest.  · You notice increasing shortness of breath during rest, sleep, or with activity.  · You have chest pain that does not get better after rest or after taking your usual medicine.  · You wake from sleep with chest pain.  · You are unable to sleep because you cannot breathe.  · You develop a frequent cough or you are coughing up blood.  · You feel dizzy, faint, or experience extreme fatigue.  · You develop severe weakness, dizziness, fainting, or chills.  Any of these symptoms may represent a serious problem that is an emergency. Do not wait to see if the symptoms will go away. Call your local emergency services (911 in the [place] Do not drive yourself to the hospital.  MAKE SURE YOU:  · Understand these instructions.  · Will watch your condition.  · Will get help right away if you are not doing well or get worse.  Document Released: 09/11/2010 Document Revised: 08/14/2013 Document Reviewed: 02/08/2013    Exit[employer]® Patient Information ©2015 [employer]. This information is not intended to replace advice given to you by your health care provider. Make sure you discuss any questions you have with your health care provider.

## 2014-05-21 ENCOUNTER — Telehealth: Payer: Self-pay | Admitting: Neurology

## 2014-05-21 NOTE — Telephone Encounter (Signed)
Patient's daughter calling to state that patient is exhibiting symptoms such as being very drowsy and sedated, please return call and advise, wants to know if she needs to bring patient in.

## 2014-05-21 NOTE — Telephone Encounter (Signed)
Closed encounter by accident, please return call to patient's daughter and advise.

## 2014-05-21 NOTE — Telephone Encounter (Signed)
No one answered i will try to call back 05-22-14

## 2014-05-21 NOTE — Telephone Encounter (Signed)
I have called the patient emergency contact to schedule an appointment and  Or  get some more information about the patient present condition.

## 2014-05-22 ENCOUNTER — Telehealth: Payer: Self-pay | Admitting: Neurology

## 2014-05-22 NOTE — Telephone Encounter (Signed)
Patient's daughter [place] NickelGail Snellgrove calling back.  Please return call @ [phone].

## 2014-05-22 NOTE — Telephone Encounter (Signed)
Patient daughter stated her mother has been very sleepy lately. She wants her mothe rto come in to be seen by Dr. Pearlean BrownieSethi, i gave her a appointment for 05-24-14 she showed understanding.

## 2014-05-24 ENCOUNTER — Encounter: Payer: Self-pay | Admitting: Neurology

## 2014-05-24 ENCOUNTER — Encounter (INDEPENDENT_AMBULATORY_CARE_PROVIDER_SITE_OTHER): Payer: Self-pay

## 2014-05-24 ENCOUNTER — Ambulatory Visit (INDEPENDENT_AMBULATORY_CARE_PROVIDER_SITE_OTHER): Payer: Medicare Other | Admitting: Neurology

## 2014-05-24 VITALS — BP 145/79 | HR 79

## 2014-05-24 DIAGNOSIS — G40209 Localization-related (focal) (partial) symptomatic epilepsy and epileptic syndromes with complex partial seizures, not intractable, without status epilepticus: Secondary | ICD-10-CM

## 2014-05-24 MED ORDER — MEMANTINE HCL ER 14 MG PO CP24: 1.0000 | ORAL_CAPSULE | Freq: Every day | ORAL | Status: DC

## 2014-05-24 NOTE — Patient Instructions (Addendum)
I had a long discussion with the patient and her daughter regarding her dementia and controversial seizures answered questions. Continue Depakote in the current dose of 500 mg at night but check a valproic acid level, ammonia level, CMP and CBC. Increase Namenda XR to 14 mg daily and if tolerated may increase further. Return for followup in 3 months with Trinda PascalLynn Laml, NP or call earlier if necessary

## 2014-05-24 NOTE — Progress Notes (Signed)
PATIENT: Megan [name] DOB: [birth year]  REASON FOR VISIT: routine follow up for hx of stroke, partial seizure, dementia HISTORY FROM: caretaker  HISTORY OF PRESENT ILLNESS: Update 05/24/2014 : She returns for followup of the last visit 3 months ago. She is taken today by her daughter who provides history. Patient seems to be doing about the same neurologically. She has not had any further unresponsive episodes but she continues to be sleepy during the day as well as sleep well at night. She has been started on Namenda but apparently is taking only 7 mg daily. She has not yet tried a higher dose. She continues to be full assist and has 24-hour supervision at home. She lives with one of her daughters during the day and the other daughter stays at night. She has had bilateral leg swelling and has seen her primary physician for that. She has not had any agitation episodes delusions or hallucinations. There have been no falls issues as she does not walk. UPDATE 02/27/14 (LL):  Since last visit, patient has been stable.  She is accompanied today by her caregiver.  Depakote is being administered once daily at bedtime with resolution of unresponsive episodes. She stays awake most of the day with a nap in the afternoon and sleeps most of the night. Lately she wants to sleep in the recliner rather than the bed which has contributed to lower leg edema.  She is not ambulatory but is able to pivot. She is extremely hard-of-hearing. Blood pressure has been stable, it is 132/59 in the office today. She is tolerating Plavix well with no signs of significant bleeding or bruising.  09/13/13 (LL):  Patient's daughter called requesting acute visit.  She is concerned that the patient cannot sit up. She is leaning to the right. Symptoms are not stroke like. Patient can respond. She was taken to the hospital on 09/07/13 for tia workup which was negative.  Dr. Thad [name] increased her Depakote by 125 mg in the am, thinking periods of  unresponsiveness and lethary was likely partial seizures. Daughter states that her mother sometimes turns her neck and head towards the right like she is uncomfortable, but when she asks her if anything is wrong, she replies "no".  Patient is becoming more deconditioned, cannot walk but a couple steps with assistance, but can still pivot from bed to chair.  Her appetite is very poor.  08/07/13 (PS):  89 year African American lady with prior pontine infarct in 2012 with spastic residual right hemiparesis with recurrent episodes of transient right-sided weakness as well as recent episodes of sleepiness and transient worsening of symptoms with multiple visits to the hospital with negative imaging studies as well as lab evaluation. She is seen today for followup after last visit with me on 09/13/12. 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 She apparently was doing fine until 3 weeks ago when she was found to have episode of increasing right-sided weakness in: Speech and sleepiness. She was taken to [facility] and admitted for 8 days and had negative brain imaging studies including CT scan and MRI on 07/13/13 which I personally reviewed which did not reveal any acute stroke. CBC, UA and basic chemistry labs were also normal. Suicidal with shingles affecting the left forearm and was treated with a cycle rate. The daughter feels that she has not been the same since then. She has periods when she is quite sleepy and at times difficult to arouse and other times when she  is more awake. Right-sided weakness seems more pronounced when she sleepy. She has seen her primary physician twice and has undergone lab work and CT scan which was negative. She was seen in the ER recently on 08/05/39 and with again negative lab work and CT scan and was given IV fluids and improved. She has also not been ambulating as much she requires one-[name] assist to walk with a walker and walks quite little. Her blood sugars have been  reasonably well controlled with fasting glucoses being in the 130s and postprandials in the 140s. She had lipid profile checked 5 weeks ago which was fine hemoglobin A1c was 6.5. Patient was tried on Keppra for seizure trial but did not tolerate it due to drowsiness. The patient has also been found to have memory difficulties and has not been as interactive and cognitively has declined for the last several months. She has never been on a trial of Aricept in the past. She had vitamin B12 checked on 07/21/13 which was low-normal at 257 and TSH was normal on 01/07/2012.  EEG was obtained on 08/08/13 with presence of left frontal and temporal spikes and sharp activity indicative of high risk for partial epilepsy. She was started on Depakote ER 500 mg at hs.  REVIEW OF SYSTEMS: Full 14 system review of systems performed and notable only for:  hearing loss, weakness, sleepiness, slow speech and all other systems negative   ALLERGIES: Allergies  Allergen Reactions  . Lipitor [Atorvastatin] Other (See Comments)    Myalgias     HOME MEDICATIONS: Outpatient Prescriptions Prior to Visit  Medication Sig Dispense Refill  . aspirin EC 325 MG EC tablet Take 1 tablet (325 mg total) by mouth daily.  30 tablet  0  . clopidogrel (PLAVIX) 75 MG tablet Take 1 tablet (75 mg total) by mouth daily with breakfast.  30 tablet  11  . divalproex (DEPAKOTE ER) 500 MG 24 hr tablet Take 1 tablet (500 mg total) by mouth at bedtime.  90 tablet  2  . metFORMIN (GLUCOPHAGE) 500 MG tablet Take 500 mg by mouth 2 (two) times daily with a meal.       . pantoprazole (PROTONIX) 40 MG tablet Take 40 mg by mouth every morning.       . rosuvastatin (CRESTOR) 5 MG tablet Take 5 mg by mouth every Monday, Wednesday, and Friday.      . Memantine HCl ER (NAMENDA XR) 7 MG CP24 Take 7 mg by mouth at bedtime.        No facility-administered medications prior to visit.    PHYSICAL EXAM Filed Vitals:   05/24/14 0844  BP: 145/79  Pulse: 79     Cannot calculate BMI with a height equal to zero.  General: Frail elderly African American lady, seated, in no evident distress  Head: head normocephalic and atraumatic.   Neck: supple with no carotid or supraclavicular bruits  Cardiovascular: regular rate and rhythm, no murmurs, bilateral LE edema 2+ Musculoskeletal: no deformity. Mild kyphoscoliosis  Skin: no rash/petichiae . 2+ pedal edema both feet right greater than left up to the knees Vascular: Normal pulses all extremities  Neurologic Exam  Mental Status: Awake and fully alert. Disoriented to place and time. Recent and remote memory poor. Attention span, concentration and fund of knowledge diminished. Mood and affect appropriate. Pseudobulbar speech with dysarthria  Cranial Nerves: Pupils equal, briskly reactive to light. Extraocular movements full without nystagmus. Visual fields full to confrontation. Facial sensation intact but weakness of  her right lower face. Tongue, palate moves normally and symmetrically.    Motor: Spastic right hemiplegia with 2/5 right upper extremity and 3/5 right lower extremity strength with distal greater than proximal weakness. Normal strength on the left side. Increased tone on the right with spasticity  Sensory: Diminished touch and pinprick and vibratory sensation on the right.  Coordination: Unable to follow commands to test. Gait and Station: Not tested as patient is in wheelchair. Reflexes: 1+ and asymmetric brisker on right.   ASSESSMENT: [age] year African American lady with remote history of left pontine stroke in 2012 due to small vessel disease with resultant right hemiparesis with several episodes of TIAs and possible partial complex seizures with recent episodes of transient sleepiness of unclear etiology with negative lab and imaging evaluation. 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  PLAN:  Continue current dose of Depakote ER, 500 mg in the pm for partial seizures. Continue Plavix  for stroke prevention and strict control of diabetes with hemoglobin A1c goal below 7% and lipids with LDL cholesterol goal below 70 mg percent. I had a long discussion with the patient and her daughter regarding her dementia and controversial seizures answered questions. Continue Depakote in the current dose of 500 mg at night but check a valproic acid level, ammonia level, CMP and CBC. Increase Namenda XR to 14 mg daily and if tolerated may increase further. Return for followup in 3 months with Trinda PascalLynn Laml, NP or call earlier if necessary  Meds ordered this encounter  Medications  . divalproex (DEPAKOTE ER) 500 MG 24 hr tablet    Sig: Take 1 tablet (500 mg total) by mouth at bedtime.    Dispense:  90 tablet    Refill:  2    Order Specific Question:  Supervising Provider    Answer:  Delia HeadySETHI, Denessa Cavan [[id number]]   Return in about 3 months (around 08/24/2014).  Ronal FearLYNN E. LAM, MSN, NP-C 05/24/2014, 9:31 AM [facility] [address], [address] [place], [place] [address] [phone]  Note: This document was prepared with digital dictation and possible smart phrase technology. Any transcriptional errors that result from this process are unintentional.

## 2014-05-25 LAB — COMPREHENSIVE METABOLIC PANEL
A/G RATIO: 1.1 (ref 1.1–2.5)
ALK PHOS: 83 IU/L (ref 39–117)
ALT: 4 IU/L (ref 0–32)
AST: 11 IU/L (ref 0–40)
Albumin: 3.6 g/dL (ref 3.2–4.6)
BUN / CREAT RATIO: 17 (ref 11–26)
BUN: 18 mg/dL (ref 10–36)
CO2: 27 mmol/L (ref 18–29)
CREATININE: 1.09 mg/dL — AB (ref 0.57–1.00)
Calcium: 10.2 mg/dL (ref 8.7–10.3)
Chloride: 95 mmol/L — ABNORMAL LOW (ref 97–108)
GFR calc Af Amer: 52 mL/min/{1.73_m2} — ABNORMAL LOW (ref >59)
GFR, EST NON AFRICAN AMERICAN: 45 mL/min/{1.73_m2} — AB (ref >59)
GLOBULIN, TOTAL: 3.4 g/dL (ref 1.5–4.5)
Glucose: 129 mg/dL — ABNORMAL HIGH (ref 65–99)
Potassium: 5.8 mmol/L — ABNORMAL HIGH (ref 3.5–5.2)
SODIUM: 134 mmol/L (ref 134–144)
Total Bilirubin: <0.2 mg/dL (ref 0.0–1.2)
Total Protein: 7 g/dL (ref 6.0–8.5)

## 2014-05-25 LAB — CBC
HCT: 28.5 % — ABNORMAL LOW (ref 34.0–46.6)
Hemoglobin: 9.1 g/dL — ABNORMAL LOW (ref 11.1–15.9)
MCH: 27.1 pg (ref 26.6–33.0)
MCHC: 31.9 g/dL (ref 31.5–35.7)
MCV: 85 fL (ref 79–97)
PLATELETS: 255 10*3/uL (ref 150–379)
RBC: 3.36 x10E6/uL — ABNORMAL LOW (ref 3.77–5.28)
RDW: 16.6 % — ABNORMAL HIGH (ref 12.3–15.4)
WBC: 4.8 10*3/uL (ref 3.4–10.8)

## 2014-05-25 LAB — VALPROIC ACID LEVEL: Valproic Acid Lvl: 49 ug/mL — ABNORMAL LOW (ref 50–100)

## 2014-05-25 LAB — AMMONIA: Ammonia: 39 ug/dL (ref 19–87)

## 2014-08-21 IMAGING — CT CT HEAD W/O CM
3 of 4 series · 17 of 30 positions shown, 19 images · non-contrast
Comparison: 08/22/2013

CLINICAL DATA: Code stroke. Right-sided deficit with gazing to the
right.

EXAM:
CT HEAD WITHOUT CONTRAST
TECHNIQUE: Contiguous axial images were obtained from the base of the skull
through the vertex without intravenous contrast.

[Series 2: head w/o · axial · non-contrast · 0.41mm/px · z∈[+79,+179]mm · 5 of 32 slices shown (1 of 2)]
[im 6/32  brain]
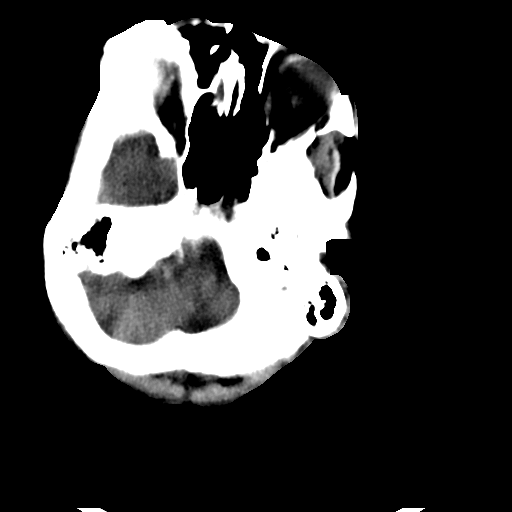
[im 11/32  brain]
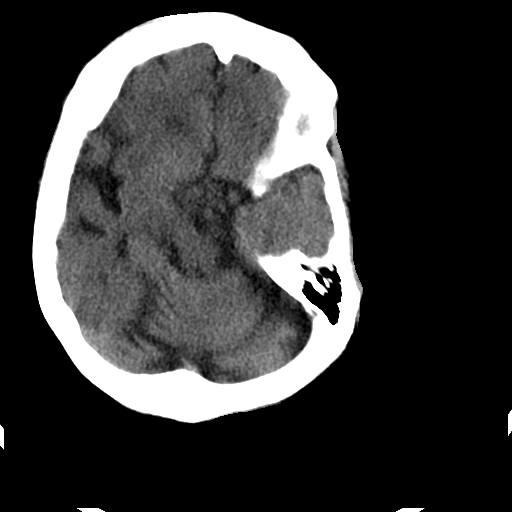
[im 16/32  brain]
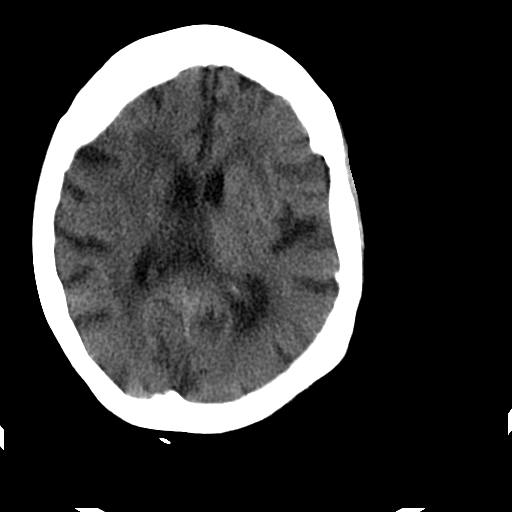
[im 21/32  brain]
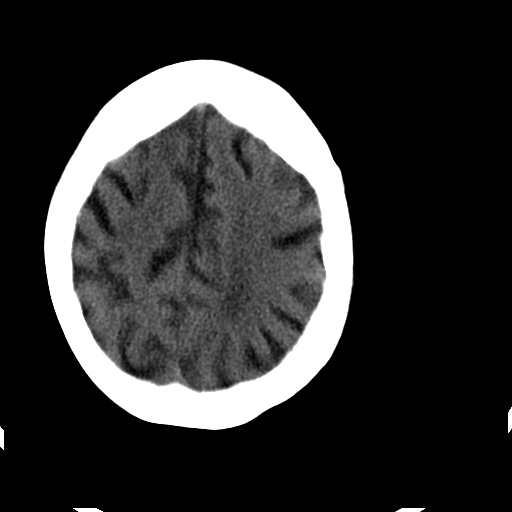
[im 26/32  brain]
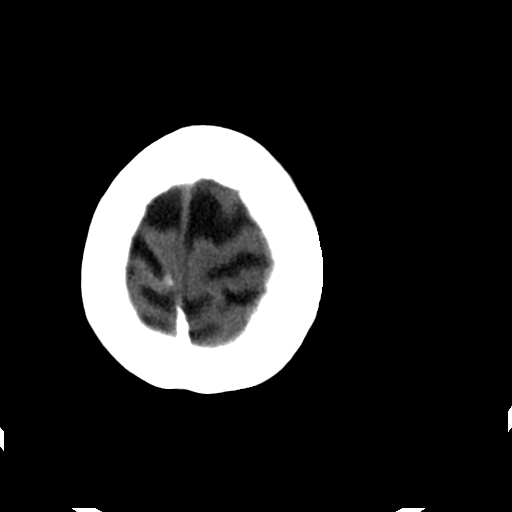

[Series 5: head w/o · axial · non-contrast · 0.41mm/px · z∈[+74,+184]mm · 6 of 32 slices shown, 8 images (2 of 2)]
[im 5/32  brain]
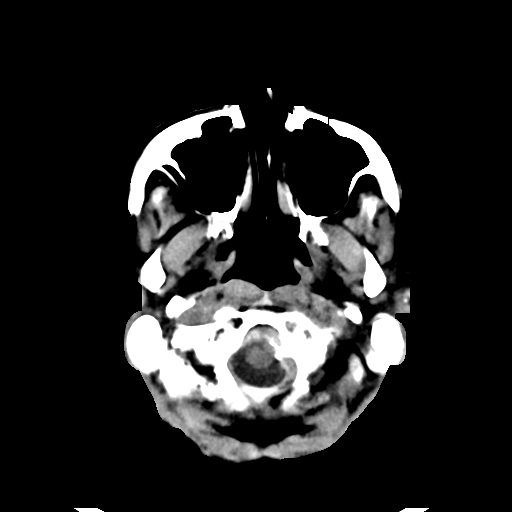
[im 5/32  bone]
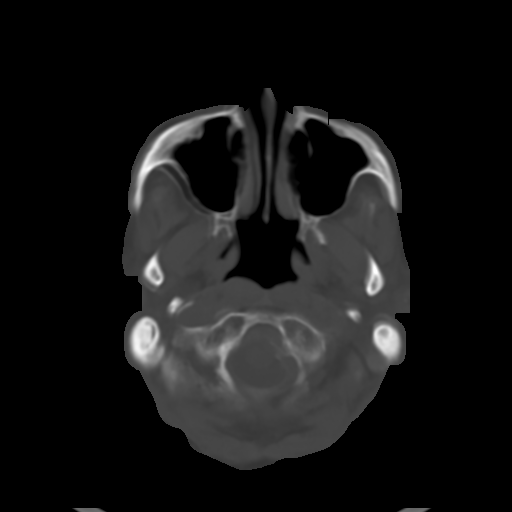
[im 9/32  brain]
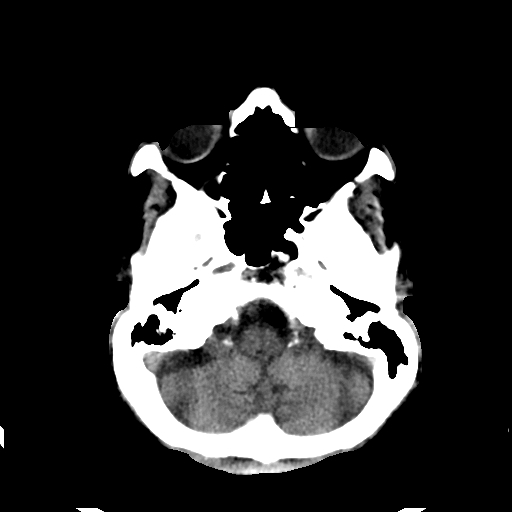
[im 14/32  brain]
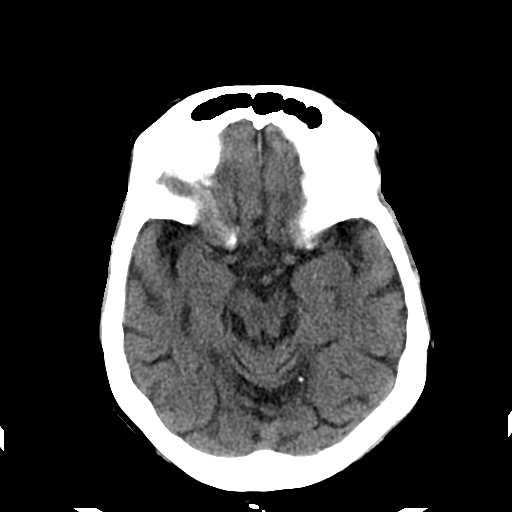
[im 18/32  brain]
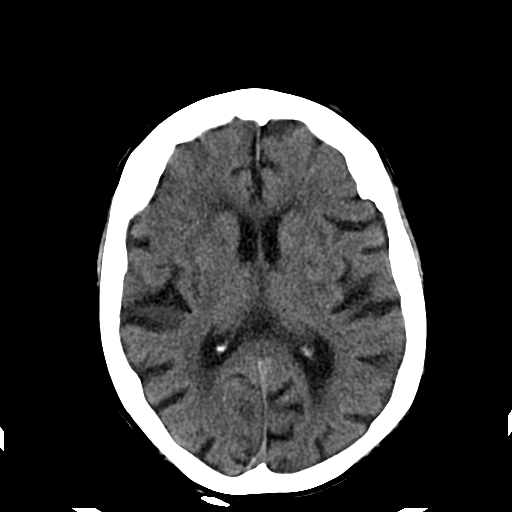
[im 23/32  brain]
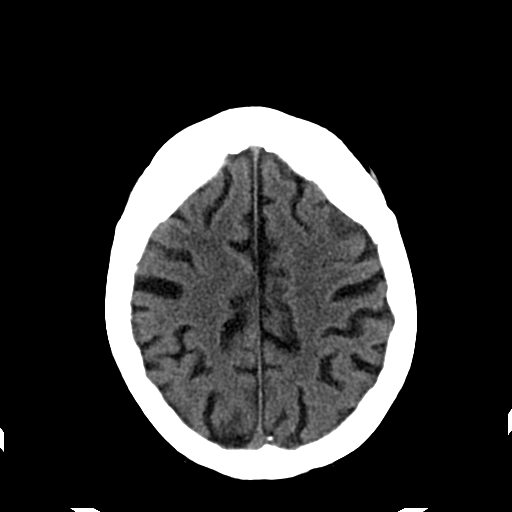
[im 23/32  bone]
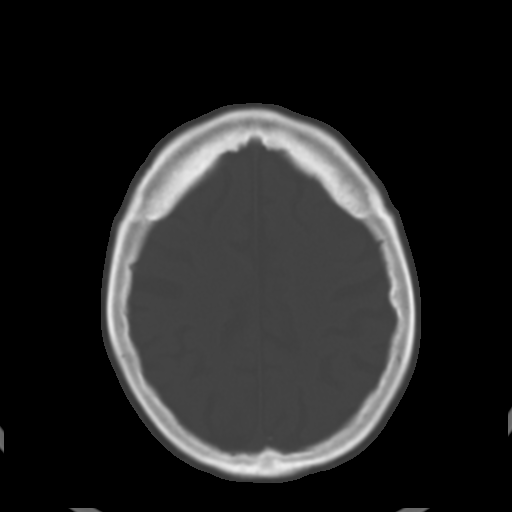
[im 27/32  brain]
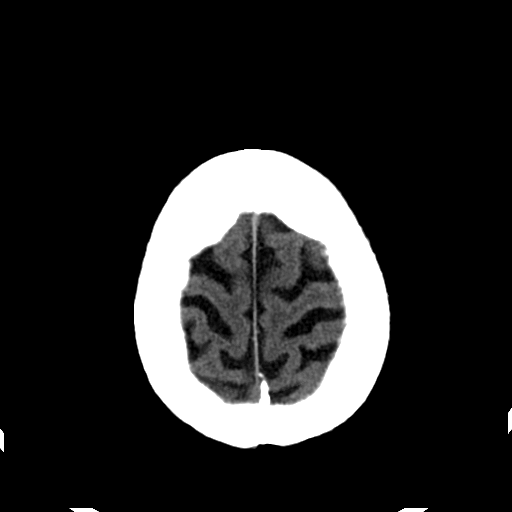

[Series 6: head w/o bone · axial · non-contrast · 0.41mm/px · z∈[+74,+184]mm · 6 of 32 slices shown]
[im 5/32  bone]
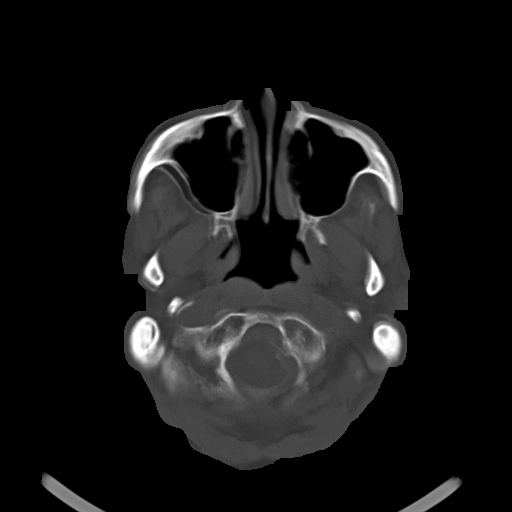
[im 9/32  bone]
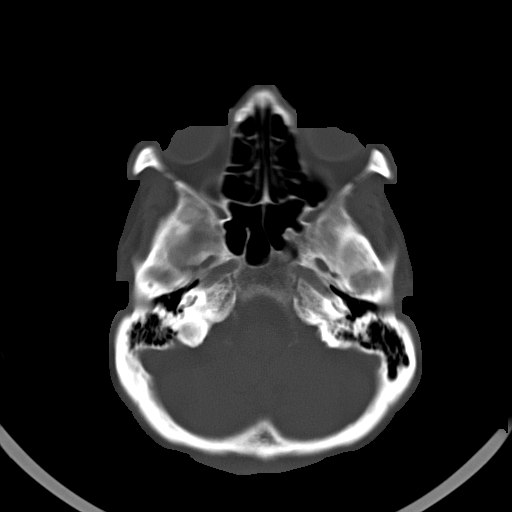
[im 14/32  bone]
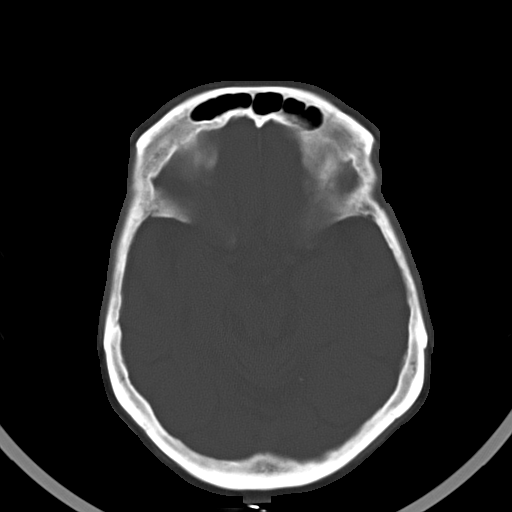
[im 18/32  bone]
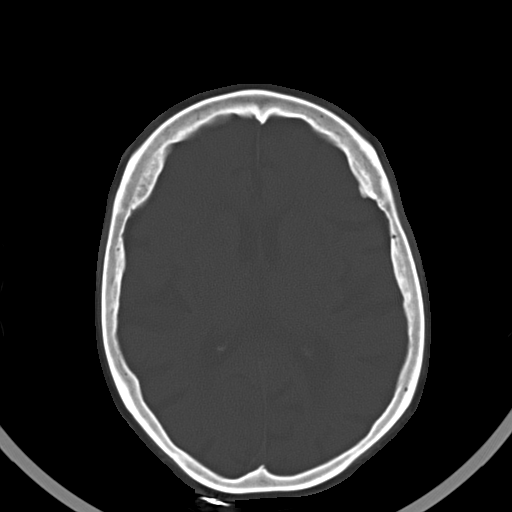
[im 23/32  bone]
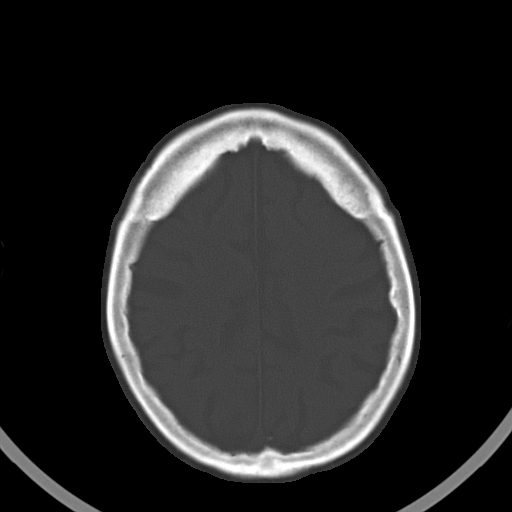
[im 27/32  bone]
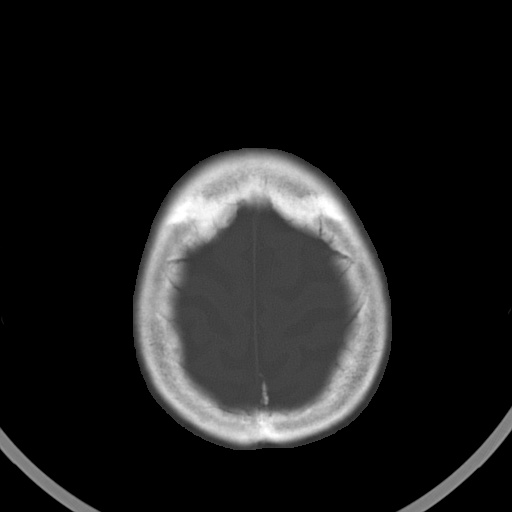

[17 of 30 positions shown; findings below may reference images not displayed]

FINDINGS: Skull and Sinuses:No significant abnormality.

Orbits: Gaze deviation to the right.

Brain: No definite large territory infarct. Apparent gray-white
differentiation loss in the anterior right frontal lobe appears
unchanged from 08/04/2013. No evidence of acute hemorrhage. Global
brain atrophy with patchy bilateral cerebral white matter disease,
consistent with chronic small vessel ischemia.

These results were called by telephone at the time of interpretation
on 08/08/2013 at [DATE] to Dr. Temple, who verbally acknowledged
these results.
IMPRESSION: 1. Negative for acute hemorrhage. No change to suggest large vessel
infarct.
2. Brain atrophy and extensive chronic small vessel ischemia.

## 2014-08-30 ENCOUNTER — Ambulatory Visit (INDEPENDENT_AMBULATORY_CARE_PROVIDER_SITE_OTHER): Payer: 59 | Admitting: Neurology

## 2014-08-30 ENCOUNTER — Encounter: Payer: Self-pay | Admitting: Neurology

## 2014-08-30 VITALS — BP 140/64 | HR 88

## 2014-08-30 DIAGNOSIS — F0390 Unspecified dementia without behavioral disturbance: Secondary | ICD-10-CM

## 2014-08-30 MED ORDER — MEMANTINE HCL ER 21 MG PO CP24: 21.0000 mg | ORAL_CAPSULE | ORAL | Status: DC

## 2014-08-30 NOTE — Patient Instructions (Signed)
I had a long discussion with the patient and and her daughter regarding her dementia and discuss results of her lab work and answered questions. I recommend increasing Namenda XR to 21 mg daily if tolerated. Continue Depakote in the current dose. Return for follow-up in 3 months or call earlier if necessary

## 2014-08-30 NOTE — Progress Notes (Signed)
PATIENT: Megan [name] DOB: November 06, 1921  REASON FOR VISIT: routine follow up for hx of stroke, partial seizure, dementia HISTORY FROM: caretaker  HISTORY OF PRESENT ILLNESS: Update 08/30/2014 : She returns for follow-up after last visit 3 months ago accompanied by her daughter who provides the history. Patient seems to be doing well and has tolerated increasing the dose of Namenda to 40 mg daily quite well. She still living at home with her -2 daughters splitting their time and providing total care. She spends most of the time in the wheelchair and the daughters provide some passive range of motion. She can swallow well and has good appetite. There have been no issues about behavior or agitation. There have been no hallucinations or delusions or confusion. She had lab work done after last visit and valproic acid level was 49 on 05/24/14 and potassium was slightly high at 5.8 but with normal creatinine and possibly the sample may have been hemolyzed. Update 05/24/2014 : She returns for followup of the last visit 3 months ago. She is taken today by her daughter who provides history. Patient seems to be doing about the same neurologically. She has not had any further unresponsive episodes but she continues to be sleepy during the day as well as sleep well at night. She has been started on Namenda but apparently is taking only 7 mg daily. She has not yet tried a higher dose. She continues to be full assist and has 24-hour supervision at home. She lives with one of her daughters during the day and the other daughter stays at night. She has had bilateral leg swelling and has seen her primary physician for that. She has not had any agitation episodes delusions or hallucinations. There have been no falls issues as she does not walk. UPDATE 02/27/14 (LL):  Since last visit, patient has been stable.  She is accompanied today by her caregiver.  Depakote is being administered once daily at bedtime with resolution of  unresponsive episodes. She stays awake most of the day with a nap in the afternoon and sleeps most of the night. Lately she wants to sleep in the recliner rather than the bed which has contributed to lower leg edema.  She is not ambulatory but is able to pivot. She is extremely hard-of-hearing. Blood pressure has been stable, it is 132/59 in the office today. She is tolerating Plavix well with no signs of significant bleeding or bruising.  09/13/13 (LL):  Patient's daughter called requesting acute visit.  She is concerned that the patient cannot sit up. She is leaning to the right. Symptoms are not stroke like. Patient can respond. She was taken to the hospital on 09/07/13 for tia workup which was negative.  Dr. Thad Rangereynolds increased her Depakote by 125 mg in the am, thinking periods of unresponsiveness and lethary was likely partial seizures. Daughter states that her mother sometimes turns her neck and head towards the right like she is uncomfortable, but when she asks her if anything is wrong, she replies "no".  Patient is becoming more deconditioned, cannot walk but a couple steps with assistance, but can still pivot from bed to chair.  Her appetite is very poor.  08/07/13 (PS):  89 year African American lady with prior pontine infarct in 2012 with spastic residual right hemiparesis with recurrent episodes of transient right-sided weakness as well as recent episodes of sleepiness and transient worsening of symptoms with multiple visits to the hospital with negative imaging studies as well as lab  evaluation. She is seen today for followup after last visit with me on 09/13/12. 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 She apparently was doing fine until 3 weeks ago when she was found to have episode of increasing right-sided weakness in: Speech and sleepiness. [facility] and admitted for 8 days and had negative brain imaging studies including CT scan and MRI on 07/13/13 which I  personally reviewed which did not reveal any acute stroke. CBC, UA and basic chemistry labs were also normal. Suicidal with shingles affecting the left forearm and was treated with a cycle rate. The daughter feels that she has not been the same since then. She has periods when she is quite sleepy and at times difficult to arouse and other times when she is more awake. Right-sided weakness seems more pronounced when she sleepy. She has seen her primary physician twice and has undergone lab work and CT scan which was negative. She was seen in the ER recently on 08/05/39 and with again negative lab work and CT scan and was given IV fluids and improved. She has also not been ambulating as much she requires one-person assist to walk with a walker and walks quite little. Her blood sugars have been reasonably well controlled with fasting glucoses being in the 130s and postprandials in the 140s. She had lipid profile checked 5 weeks ago which was fine hemoglobin A1c was 6.5. Patient was tried on Keppra for seizure trial but did not tolerate it due to drowsiness. The patient has also been found to have memory difficulties and has not been as interactive and cognitively has declined for the last several months. She has never been on a trial of Aricept in the past. She had vitamin B12 checked on 07/21/13 which was low-normal at 257 and TSH was normal on 01/07/2012.  EEG was obtained on 08/08/13 with presence of left frontal and temporal spikes and sharp activity indicative of high risk for partial epilepsy. She was started on Depakote ER 500 mg at hs.  REVIEW OF SYSTEMS: Full 14 system review of systems performed and notable only for:  Runny nose, memory loss, gait difficulty, leg swelling and all other systems negative ALLERGIES: Allergies  Allergen Reactions  . Lipitor [Atorvastatin] Other (See Comments)    Myalgias     HOME MEDICATIONS: Outpatient Prescriptions Prior to Visit  Medication Sig Dispense Refill  .  clopidogrel (PLAVIX) 75 MG tablet Take 1 tablet (75 mg total) by mouth daily with breakfast. 30 tablet 11  . divalproex (DEPAKOTE ER) 500 MG 24 hr tablet Take 1 tablet (500 mg total) by mouth at bedtime. 90 tablet 2  . metFORMIN (GLUCOPHAGE) 500 MG tablet Take 500 mg by mouth 2 (two) times daily with a meal.     . pantoprazole (PROTONIX) 40 MG tablet Take 40 mg by mouth every morning.     . rosuvastatin (CRESTOR) 5 MG tablet Take 5 mg by mouth every Monday, Wednesday, and Friday.    . Memantine HCl ER (NAMENDA XR) 14 MG CP24 Take 1 tablet (14 mg total) by mouth daily. 30 capsule 2  . aspirin EC 325 MG EC tablet Take 1 tablet (325 mg total) by mouth daily. 30 tablet 0   No facility-administered medications prior to visit.    PHYSICAL EXAM Filed Vitals:   08/30/14 1118  BP: 140/64  Pulse: 88   Cannot calculate BMI with a height equal to zero.  General: Frail elderly African American  lady, seated, in no evident distress  Head: head normocephalic and atraumatic.   Neck: supple with no carotid or supraclavicular bruits  Cardiovascular: regular rate and rhythm, no murmurs, bilateral LE edema 2+ Musculoskeletal: no deformity. Mild kyphoscoliosis  Skin: no rash/petichiae . 2+ pedal edema both feet right greater than left up to the knees Vascular: Normal pulses all extremities  Neurologic Exam  Mental Status: Awake and fully alert. Disoriented to place and time. Recent and remote memory poor. Attention span, concentration and fund of knowledge diminished. Mood and affect appropriate. Mini-Mental status exam not done. Pseudobulbar speech with dysarthria and raspy voice Cranial Nerves: Pupils equal, briskly reactive to light. Extraocular movements full without nystagmus. Visual fields full to confrontation. Facial sensation intact but weakness of her right lower face. Tongue, palate moves normally and symmetrically.    Motor: Spastic right hemiplegia with 2/5 right upper extremity and 3/5 right  lower extremity strength with distal greater than proximal weakness. Normal strength on the left side. Increased tone on the right with spasticity  Sensory: Diminished touch and pinprick and vibratory sensation on the right.  Coordination: Unable to follow commands to test. Gait and Station: Not tested as patient is in wheelchair. Reflexes: 1+ and asymmetric brisker on right.   ASSESSMENT: 54 year African American lady with remote history of left pontine stroke in 2012 due to small vessel disease with resultant right hemiparesis with several episodes of TIAs and possible partial complex seizures with recent episodes of transient sleepiness of unclear etiology with negative lab and imaging evaluation. 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  PLAN:    I had a long discussion with the patient and and her daughter regarding her dementia and discuss results of her lab work and answered questions. I recommend increasing Namenda XR to 21 mg daily if tolerated. Continue Depakote in the current dose. Return for follow-up in 3 months or call earlier if necessary  Meds ordered this encounter  Medications  . divalproex (DEPAKOTE ER) 500 MG 24 hr tablet    Sig: Take 1 tablet (500 mg total) by mouth at bedtime.    Dispense:  90 tablet    Refill:  2    Order Specific Question:  Supervising Provider    Answer:  Delia Heady [[id number]]   Return in about 3 months (around 11/29/2014).  Delia Heady, MD  08/30/2014, 11:35 AM [facility] [address], [address] [place], [place] [address] [phone]  Note: This document was prepared with digital dictation and possible smart phrase technology. Any transcriptional errors that result from this process are unintentional.

## 2014-10-23 ENCOUNTER — Encounter (HOSPITAL_COMMUNITY): Payer: Self-pay | Admitting: *Deleted

## 2014-10-23 ENCOUNTER — Inpatient Hospital Stay (HOSPITAL_COMMUNITY): Payer: Medicare Other

## 2014-10-23 ENCOUNTER — Inpatient Hospital Stay (HOSPITAL_COMMUNITY)
Admission: EM | Admit: 2014-10-23 | Discharge: 2014-10-25 | DRG: 682 | Disposition: A | Discharge status: Home Health | Payer: Medicare Other | Attending: Internal Medicine | Admitting: Internal Medicine

## 2014-10-23 ENCOUNTER — Emergency Department (HOSPITAL_COMMUNITY): Payer: Medicare Other

## 2014-10-23 DIAGNOSIS — Z7902 Long term (current) use of antithrombotics/antiplatelets: Secondary | ICD-10-CM | POA: Diagnosis not present

## 2014-10-23 DIAGNOSIS — I10 Essential (primary) hypertension: Secondary | ICD-10-CM | POA: Diagnosis present

## 2014-10-23 DIAGNOSIS — L89302 Pressure ulcer of unspecified buttock, stage 2: Secondary | ICD-10-CM | POA: Diagnosis present

## 2014-10-23 DIAGNOSIS — I69392 Facial weakness following cerebral infarction: Secondary | ICD-10-CM | POA: Diagnosis not present

## 2014-10-23 DIAGNOSIS — G3183 Dementia with Lewy bodies: Secondary | ICD-10-CM | POA: Diagnosis present

## 2014-10-23 DIAGNOSIS — Z888 Allergy status to other drugs, medicaments and biological substances status: Secondary | ICD-10-CM | POA: Diagnosis not present

## 2014-10-23 DIAGNOSIS — Z6825 Body mass index (BMI) 25.0-25.9, adult: Secondary | ICD-10-CM

## 2014-10-23 DIAGNOSIS — E1165 Type 2 diabetes mellitus with hyperglycemia: Secondary | ICD-10-CM | POA: Diagnosis present

## 2014-10-23 DIAGNOSIS — I69320 Aphasia following cerebral infarction: Secondary | ICD-10-CM

## 2014-10-23 DIAGNOSIS — H9190 Unspecified hearing loss, unspecified ear: Secondary | ICD-10-CM | POA: Diagnosis present

## 2014-10-23 DIAGNOSIS — F0280 Dementia in other diseases classified elsewhere without behavioral disturbance: Secondary | ICD-10-CM | POA: Diagnosis present

## 2014-10-23 DIAGNOSIS — N179 Acute kidney failure, unspecified: Secondary | ICD-10-CM | POA: Diagnosis present

## 2014-10-23 DIAGNOSIS — G9340 Encephalopathy, unspecified: Secondary | ICD-10-CM | POA: Diagnosis present

## 2014-10-23 DIAGNOSIS — D649 Anemia, unspecified: Secondary | ICD-10-CM | POA: Diagnosis present

## 2014-10-23 DIAGNOSIS — R8290 Unspecified abnormal findings in urine: Secondary | ICD-10-CM | POA: Diagnosis present

## 2014-10-23 DIAGNOSIS — E860 Dehydration: Secondary | ICD-10-CM | POA: Diagnosis present

## 2014-10-23 DIAGNOSIS — E43 Unspecified severe protein-calorie malnutrition: Secondary | ICD-10-CM | POA: Diagnosis present

## 2014-10-23 DIAGNOSIS — E875 Hyperkalemia: Secondary | ICD-10-CM | POA: Diagnosis present

## 2014-10-23 DIAGNOSIS — Z833 Family history of diabetes mellitus: Secondary | ICD-10-CM | POA: Diagnosis not present

## 2014-10-23 DIAGNOSIS — I638 Other cerebral infarction: Secondary | ICD-10-CM

## 2014-10-23 DIAGNOSIS — IMO0002 Reserved for concepts with insufficient information to code with codable children: Secondary | ICD-10-CM | POA: Diagnosis present

## 2014-10-23 DIAGNOSIS — N183 Chronic kidney disease, stage 3 unspecified: Secondary | ICD-10-CM | POA: Diagnosis present

## 2014-10-23 DIAGNOSIS — Z79899 Other long term (current) drug therapy: Secondary | ICD-10-CM

## 2014-10-23 DIAGNOSIS — E785 Hyperlipidemia, unspecified: Secondary | ICD-10-CM | POA: Diagnosis present

## 2014-10-23 DIAGNOSIS — Z8249 Family history of ischemic heart disease and other diseases of the circulatory system: Secondary | ICD-10-CM | POA: Diagnosis not present

## 2014-10-23 DIAGNOSIS — G40909 Epilepsy, unspecified, not intractable, without status epilepticus: Secondary | ICD-10-CM | POA: Diagnosis present

## 2014-10-23 DIAGNOSIS — I639 Cerebral infarction, unspecified: Secondary | ICD-10-CM

## 2014-10-23 DIAGNOSIS — Z66 Do not resuscitate: Secondary | ICD-10-CM | POA: Diagnosis present

## 2014-10-23 DIAGNOSIS — I129 Hypertensive chronic kidney disease with stage 1 through stage 4 chronic kidney disease, or unspecified chronic kidney disease: Secondary | ICD-10-CM | POA: Diagnosis present

## 2014-10-23 DIAGNOSIS — I69951 Hemiplegia and hemiparesis following unspecified cerebrovascular disease affecting right dominant side: Secondary | ICD-10-CM | POA: Diagnosis not present

## 2014-10-23 DIAGNOSIS — R040 Epistaxis: Secondary | ICD-10-CM | POA: Diagnosis present

## 2014-10-23 DIAGNOSIS — Z8669 Personal history of other diseases of the nervous system and sense organs: Secondary | ICD-10-CM

## 2014-10-23 DIAGNOSIS — Z8673 Personal history of transient ischemic attack (TIA), and cerebral infarction without residual deficits: Secondary | ICD-10-CM

## 2014-10-23 LAB — ETHANOL

## 2014-10-23 LAB — URINALYSIS, ROUTINE W REFLEX MICROSCOPIC
Bilirubin Urine: NEGATIVE
Glucose, UA: 250 mg/dL — AB
Hgb urine dipstick: NEGATIVE
KETONES UR: 15 mg/dL — AB
Leukocytes, UA: NEGATIVE
Nitrite: NEGATIVE
PROTEIN: NEGATIVE mg/dL
SPECIFIC GRAVITY, URINE: 1.017 (ref 1.005–1.030)
UROBILINOGEN UA: 0.2 mg/dL (ref 0.0–1.0)
pH: 6.5 (ref 5.0–8.0)

## 2014-10-23 LAB — DIFFERENTIAL
BASOS ABS: 0 10*3/uL (ref 0.0–0.1)
Basophils Relative: 0 % (ref 0–1)
Eosinophils Absolute: 0 10*3/uL (ref 0.0–0.7)
Eosinophils Relative: 0 % (ref 0–5)
LYMPHS ABS: 2.7 10*3/uL (ref 0.7–4.0)
Lymphocytes Relative: 36 % (ref 12–46)
Monocytes Absolute: 0.6 10*3/uL (ref 0.1–1.0)
Monocytes Relative: 8 % (ref 3–12)
NEUTROS ABS: 4.2 10*3/uL (ref 1.7–7.7)
NEUTROS PCT: 56 % (ref 43–77)

## 2014-10-23 LAB — BASIC METABOLIC PANEL
Anion gap: 9 (ref 5–15)
BUN: 32 mg/dL — ABNORMAL HIGH (ref 6–23)
CO2: 23 mmol/L (ref 19–32)
CREATININE: 1.49 mg/dL — AB (ref 0.50–1.10)
Calcium: 9.6 mg/dL (ref 8.4–10.5)
Chloride: 107 mmol/L (ref 96–112)
GFR, EST AFRICAN AMERICAN: 34 mL/min — AB (ref >90)
GFR, EST NON AFRICAN AMERICAN: 30 mL/min — AB (ref >90)
Glucose, Bld: 235 mg/dL — ABNORMAL HIGH (ref 70–99)
Potassium: 4.9 mmol/L (ref 3.5–5.1)
Sodium: 139 mmol/L (ref 135–145)

## 2014-10-23 LAB — COMPREHENSIVE METABOLIC PANEL
ALK PHOS: 80 U/L (ref 39–117)
ALT: 9 U/L (ref 0–35)
AST: 17 U/L (ref 0–37)
Albumin: 2.9 g/dL — ABNORMAL LOW (ref 3.5–5.2)
Anion gap: 12 (ref 5–15)
BILIRUBIN TOTAL: 0.4 mg/dL (ref 0.3–1.2)
BUN: 34 mg/dL — ABNORMAL HIGH (ref 6–23)
CHLORIDE: 101 mmol/L (ref 96–112)
CO2: 23 mmol/L (ref 19–32)
Calcium: 9.5 mg/dL (ref 8.4–10.5)
Creatinine, Ser: 1.73 mg/dL — ABNORMAL HIGH (ref 0.50–1.10)
GFR calc Af Amer: 29 mL/min — ABNORMAL LOW (ref >90)
GFR calc non Af Amer: 25 mL/min — ABNORMAL LOW (ref >90)
Glucose, Bld: 288 mg/dL — ABNORMAL HIGH (ref 70–99)
POTASSIUM: 6 mmol/L — AB (ref 3.5–5.1)
Sodium: 136 mmol/L (ref 135–145)
Total Protein: 6.7 g/dL (ref 6.0–8.3)

## 2014-10-23 LAB — GLUCOSE, CAPILLARY
GLUCOSE-CAPILLARY: 209 mg/dL — AB (ref 70–99)
Glucose-Capillary: 172 mg/dL — ABNORMAL HIGH (ref 70–99)
Glucose-Capillary: 226 mg/dL — ABNORMAL HIGH (ref 70–99)

## 2014-10-23 LAB — I-STAT CHEM 8, ED
BUN: 35 mg/dL — ABNORMAL HIGH (ref 6–23)
CALCIUM ION: 1.27 mmol/L (ref 1.13–1.30)
Chloride: 105 mmol/L (ref 96–112)
Creatinine, Ser: 1.6 mg/dL — ABNORMAL HIGH (ref 0.50–1.10)
Glucose, Bld: 294 mg/dL — ABNORMAL HIGH (ref 70–99)
HEMATOCRIT: 30 % — AB (ref 36.0–46.0)
HEMOGLOBIN: 10.2 g/dL — AB (ref 12.0–15.0)
POTASSIUM: 6 mmol/L — AB (ref 3.5–5.1)
SODIUM: 138 mmol/L (ref 135–145)
TCO2: 21 mmol/L (ref 0–100)

## 2014-10-23 LAB — I-STAT TROPONIN, ED: Troponin i, poc: 0.01 ng/mL (ref 0.00–0.08)

## 2014-10-23 LAB — CBC
HCT: 26.9 % — ABNORMAL LOW (ref 36.0–46.0)
Hemoglobin: 8.4 g/dL — ABNORMAL LOW (ref 12.0–15.0)
MCH: 26.3 pg (ref 26.0–34.0)
MCHC: 31.2 g/dL (ref 30.0–36.0)
MCV: 84.3 fL (ref 78.0–100.0)
Platelets: 227 10*3/uL (ref 150–400)
RBC: 3.19 MIL/uL — AB (ref 3.87–5.11)
RDW: 16 % — ABNORMAL HIGH (ref 11.5–15.5)
WBC: 7.5 10*3/uL (ref 4.0–10.5)

## 2014-10-23 LAB — RAPID URINE DRUG SCREEN, HOSP PERFORMED
AMPHETAMINES: NOT DETECTED
BARBITURATES: NOT DETECTED
BENZODIAZEPINES: NOT DETECTED
Cocaine: NOT DETECTED
Opiates: NOT DETECTED
TETRAHYDROCANNABINOL: NOT DETECTED

## 2014-10-23 LAB — CBG MONITORING, ED: GLUCOSE-CAPILLARY: 244 mg/dL — AB (ref 70–99)

## 2014-10-23 LAB — PROTIME-INR
INR: 1.13 (ref 0.00–1.49)
Prothrombin Time: 14.6 seconds (ref 11.6–15.2)

## 2014-10-23 LAB — VALPROIC ACID LEVEL: VALPROIC ACID LVL: 53 ug/mL (ref 50.0–100.0)

## 2014-10-23 LAB — APTT: APTT: 24 s (ref 24–37)

## 2014-10-23 MED ORDER — INSULIN ASPART 100 UNIT/ML ~~LOC~~ SOLN
10.0000 [IU] | Freq: Once | SUBCUTANEOUS | Status: DC
  Filled 2014-10-23: qty 1

## 2014-10-23 MED ORDER — SENNOSIDES-DOCUSATE SODIUM 8.6-50 MG PO TABS
1.0000 | ORAL_TABLET | Freq: Every evening | ORAL | Status: DC | PRN
  Administered 2014-10-24: 1 via ORAL
  Filled 2014-10-23: qty 1

## 2014-10-23 MED ORDER — SODIUM CHLORIDE 0.9 % IV SOLN
1.0000 g | Freq: Once | INTRAVENOUS | Status: AC
  Administered 2014-10-23: 1 g via INTRAVENOUS
  Filled 2014-10-23: qty 10

## 2014-10-23 MED ORDER — SODIUM CHLORIDE 0.9 % IV BOLUS (SEPSIS)
500.0000 mL | Freq: Once | INTRAVENOUS | Status: AC
  Administered 2014-10-23: 500 mL via INTRAVENOUS

## 2014-10-23 MED ORDER — INSULIN ASPART 100 UNIT/ML ~~LOC~~ SOLN
10.0000 [IU] | Freq: Once | SUBCUTANEOUS | Status: AC
  Administered 2014-10-23: 10 [IU] via INTRAVENOUS

## 2014-10-23 MED ORDER — SODIUM CHLORIDE 0.9 % IV SOLN
INTRAVENOUS | Status: DC
  Administered 2014-10-23 – 2014-10-25 (×5): via INTRAVENOUS

## 2014-10-23 MED ORDER — DEXTROSE 50 % IV SOLN
1.0000 | Freq: Once | INTRAVENOUS | Status: AC
  Administered 2014-10-23: 50 mL via INTRAVENOUS
  Filled 2014-10-23: qty 50

## 2014-10-23 MED ORDER — STROKE: EARLY STAGES OF RECOVERY BOOK
Freq: Once | Status: AC
  Administered 2014-10-23: 14:00:00
  Filled 2014-10-23: qty 1

## 2014-10-23 MED ORDER — ROSUVASTATIN CALCIUM 5 MG PO TABS
5.0000 mg | ORAL_TABLET | ORAL | Status: DC
  Administered 2014-10-23: 5 mg via ORAL
  Filled 2014-10-23 (×2): qty 1

## 2014-10-23 MED ORDER — DIVALPROEX SODIUM ER 500 MG PO TB24
500.0000 mg | ORAL_TABLET | Freq: Every day | ORAL | Status: DC
  Administered 2014-10-23 – 2014-10-24 (×2): 500 mg via ORAL
  Filled 2014-10-23 (×4): qty 1

## 2014-10-23 MED ORDER — INSULIN ASPART 100 UNIT/ML ~~LOC~~ SOLN
0.0000 [IU] | Freq: Every day | SUBCUTANEOUS | Status: DC
  Administered 2014-10-24: 2 [IU] via SUBCUTANEOUS

## 2014-10-23 MED ORDER — MEMANTINE HCL ER 21 MG PO CP24
21.0000 mg | ORAL_CAPSULE | Freq: Every day | ORAL | Status: DC
  Administered 2014-10-23 – 2014-10-25 (×3): 21 mg via ORAL
  Filled 2014-10-23 (×3): qty 1

## 2014-10-23 MED ORDER — CLOPIDOGREL BISULFATE 75 MG PO TABS
75.0000 mg | ORAL_TABLET | Freq: Every day | ORAL | Status: DC
  Administered 2014-10-23 – 2014-10-25 (×3): 75 mg via ORAL
  Filled 2014-10-23 (×4): qty 1

## 2014-10-23 MED ORDER — MEMANTINE HCL ER 21 MG PO CP24: 21.0000 mg | ORAL_CAPSULE | ORAL | Status: DC

## 2014-10-23 MED ORDER — INSULIN ASPART 100 UNIT/ML ~~LOC~~ SOLN
0.0000 [IU] | Freq: Three times a day (TID) | SUBCUTANEOUS | Status: DC
  Administered 2014-10-23 – 2014-10-24 (×2): 2 [IU] via SUBCUTANEOUS
  Administered 2014-10-24: 3 [IU] via SUBCUTANEOUS
  Administered 2014-10-24: 2 [IU] via SUBCUTANEOUS
  Administered 2014-10-25: 1 [IU] via SUBCUTANEOUS
  Administered 2014-10-25: 3 [IU] via SUBCUTANEOUS

## 2014-10-23 MED ORDER — HEPARIN SODIUM (PORCINE) 5000 UNIT/ML IJ SOLN
5000.0000 [IU] | Freq: Three times a day (TID) | INTRAMUSCULAR | Status: DC
  Administered 2014-10-23 – 2014-10-25 (×7): 5000 [IU] via SUBCUTANEOUS
  Filled 2014-10-23 (×6): qty 1

## 2014-10-23 MED ORDER — PANTOPRAZOLE SODIUM 40 MG PO TBEC
40.0000 mg | DELAYED_RELEASE_TABLET | Freq: Every morning | ORAL | Status: DC
  Administered 2014-10-23 – 2014-10-25 (×3): 40 mg via ORAL
  Filled 2014-10-23 (×3): qty 1

## 2014-10-23 MED ORDER — LORAZEPAM 2 MG/ML IJ SOLN
0.5000 mg | Freq: Once | INTRAMUSCULAR | Status: AC
  Administered 2014-10-23: 0.5 mg via INTRAVENOUS
  Filled 2014-10-23: qty 1

## 2014-10-23 NOTE — Evaluation (Signed)
Clinical/Bedside Swallow Evaluation Patient Details  Name: Megan [name] MRN: [id number] Date of Birth: 1922/07/02  Today's Date: 10/23/2014 Time: SLP Start Time (ACUTE ONLY): 1440 SLP Stop Time (ACUTE ONLY): 1508 SLP Time Calculation (min) (ACUTE ONLY): 28 min  Past Medical History:  Past Medical History  Diagnosis Date  . Stroke   . Diabetes mellitus   . Hyperlipidemia   . Seizure    Past Surgical History:  Past Surgical History  Procedure Laterality Date  . Cataract extraction     HPI:  81 year old woman with history of pontine stroke 2013 with right-sided weakness last seen normal midnight 3/2 presented approximately 8 hours later to ED for increased right-sided weakness above baseline.  At baseline, pt eats a regular diet, is conversant. CT head no acute intracranial abnormality. Hx includes DM, seizure disorder, dementia.    Assessment / Plan / Recommendation Clinical Impression  Pt presents with functional oropharyngeal swallow, with no s/s of aspiration, however due to changes in MS requires cues to maintain state of wakefulness.  Dtr describes normal eating PTA.  Recommend initiating a mechanical soft diet, thin liquids (straws are fine), meds whole in puree.  Full supervision with meals to ensure safety until MS returns to baseline.  No SLP f/u warranted.     Aspiration Risk  Mild    Diet Recommendation Dysphagia 3 (Mechanical Soft);Thin liquid   Liquid Administration via: Straw Medication Administration: Whole meds with puree Supervision: Staff to assist with self feeding Compensations: Slow rate;Small sips/bites Postural Changes and/or Swallow Maneuvers: Seated upright 90 degrees    Other  Recommendations Oral Care Recommendations: Oral care BID   Follow Up Recommendations  None      Swallow Study Prior Functional Status       General Date of Onset: 10/23/14 HPI: 81 year old woman with history of pontine stroke 2013 with right-sided weakness last seen normal  midnight 3/2 presented approximately 8 hours later to ED for increased right-sided weakness above baseline.  At baseline, pt eats a regular diet, is conversant. CT head no acute intracranial abnormality. Hx includes DM, seizure disorder, dementia.  Type of Study: Bedside swallow evaluation Previous Swallow Assessment: yes, during prior hospitalizations, all of which have found functional swallow Diet Prior to this Study: NPO Temperature Spikes Noted: No Respiratory Status: Room air History of Recent Intubation: No Behavior/Cognition: Alert Oral Cavity - Dentition: Edentulous Self-Feeding Abilities: Needs assist Patient Positioning: Upright in bed Baseline Vocal Quality:  (no verbalizations) Volitional Cough: Cognitively unable to elicit Volitional Swallow: Unable to elicit    Oral/Motor/Sensory Function Overall Oral Motor/Sensory Function: Appears within functional limits for tasks assessed   Ice Chips Ice chips: Within functional limits   Thin Liquid Thin Liquid: Within functional limits Presentation: Straw;Self Fed    Nectar Thick Nectar Thick Liquid: Not tested   Honey Thick Honey Thick Liquid: Not tested   Puree Puree: Within functional limits Presentation: Spoon   Solid  Megan [name], [place] CCC/SLP Pager [phone]     Solid: Impaired Oral Phase Functional Implications:  (prolonged mastication)       Blenda MountsCouture, Megan [name] 10/23/2014,3:15 PM

## 2014-10-23 NOTE — ED Notes (Signed)
MD,Hospitalist, at bedside.

## 2014-10-23 NOTE — ED Notes (Signed)
Per EMS- pt was last seen normal by daughter at midnight. Pt woke this morning with increased rt sided weakness and facial droop. Pt has hx of stroke in 2013 with rt sided weakness.

## 2014-10-23 NOTE — Code Documentation (Signed)
Patient LKW at midnight.  Per EMS patient with exacerbated right sided weakness this morning upon waking up.  Per family patient has a stroke in 2013 with right side deficits. Patient arrived via EMS 0800.  Patient not verbal during our assessment.  Stat head CT done.  NIHSS 20.  Bilat leg weakness, nonverbal, right visual field cut, right weakness. Dr Amada JupiterKirkpatrick at bedside to assess patient.

## 2014-10-23 NOTE — Procedures (Signed)
ELECTROENCEPHALOGRAM REPORT  Patient: Megan [name]       Room #: 1H085W39 EEG No. ID: [id number] Age: 81 y.o.        Sex: female Referring Physician: Irene LimboGOODRICH, D Report Date:  10/23/2014        Interpreting Physician: Aline BrochureSTEWART,Brennen Gardiner R  History: Megan [name] is an 81 y.o. female with a history of pontine stroke with right side weakness, who presented to the emergency room with worsening of each output and of right-sided weakness starting 8 hours earlier. CT of her head showed no acute intracranial abnormality. The past medical history in addition to stroke includes diabetes mellitus she disorder and dementia.  Indications for study:  Rule out seizure activity.  Technique: This is an 18 channel routine scalp EEG performed at the bedside with bipolar and monopolar montages arranged in accordance to the international 10/20 system of electrode placement.   Description: This EEG recording was performed during wakefulness and during sleep. Background activity during wakefulness consisted of diffuse low amplitude symmetrical fast beta activity. Occasional brief occurrences of 8-9 Hz alpha rhythm recorded from the posterior head region. Photic stimulation and hyperventilation were not performed. There was generalized slowing of cerebral activity with mixed delta and theta activity diffusely. Symmetrical vertex waves, sleep spindles and K-complexes are recorded during stage II of sleep. No epileptiform discharges were recorded during wakefulness nor during sleep.  Interpretation: This is a normal EEG recording during wakefulness and during sleep. No epileptiform activity was seen during this recording.   Venetia MaxonR Oswin Johal M.D. [facility] [phone]

## 2014-10-23 NOTE — Consult Note (Addendum)
WOC wound consult note Reason for Consult: Consult requested for buttocks. Family at bedside states area will improve and almost heal, then return. They are applying protective cream when at home. Wound type: Stage 2 pressure ulcer to inner buttocks Pressure Ulcer POA: Yes Measurement: 2X2X.1cm Wound bed: pink dry woundbed Drainage (amount, consistency, odor) No odor or drainage Periwound: Surrounded by pink dry scar tissue where previous wound has healed. Dressing procedure/placement/frequency: Foam dressing to protect and promote healing.  Discussed plan of care with family at bedside and they verbalize understanding. Please re-consult if further assistance is needed.  Thank-you,  Cammie Mcgeeawn Federick Levene MSN, RN, CWOCN, [place]AP, CNS [phone]

## 2014-10-23 NOTE — ED Provider Notes (Addendum)
CSN: [id number]     Arrival date & time 10/23/14  0800 History   First MD Initiated Contact with Patient 10/23/14 0813     Chief Complaint  Patient presents with  . Code Stroke    HPI Comments: Pt has a history of prior CVA in 2013.  She has residual right sided weakness and facial droop. This morning the daughter felt that her symptoms were worse.  Pt is very hard of hearing and has not spoken in the ED. History is limited  Patient is a 81 y.o. female presenting with Acute Neurological Problem. The history is provided by the patient.  Cerebrovascular Accident This is a new problem. Episode onset: Last time seen normal was midnight. The problem occurs constantly. The problem has not changed since onset.Pertinent negatives include no chest pain, no abdominal pain, no headaches and no shortness of breath. Nothing relieves the symptoms. She has tried nothing for the symptoms.    Past Medical History  Diagnosis Date  . Stroke   . Diabetes mellitus   . Hyperlipidemia   . Seizure    Past Surgical History  Procedure Laterality Date  . No past surgeries     Family History  Problem Relation Age of Onset  . CAD Mother   . Hypertension Mother   . Diabetes Mother   . Stroke Mother    History  Substance Use Topics  . Smoking status: Never Smoker   . Smokeless tobacco: Former NeurosurgeonUser    Types: Snuff  . Alcohol Use: No   OB History    No data available     Review of Systems  Unable to perform ROS: Patient unresponsive  Respiratory: Negative for shortness of breath.   Cardiovascular: Negative for chest pain.  Gastrointestinal: Negative for abdominal pain.  Neurological: Negative for headaches.      Allergies  Lipitor  Home Medications   Prior to Admission medications   Medication Sig Start Date End Date Taking? Authorizing Provider  clopidogrel (PLAVIX) 75 MG tablet Take 1 tablet (75 mg total) by mouth daily with breakfast. 08/15/13   Lillia MountainJohn [name] Griffin, MD  divalproex  (DEPAKOTE ER) 500 MG 24 hr tablet Take 1 tablet (500 mg total) by mouth at bedtime. 02/27/14   Ronal FearLynn E Lam, NP  Memantine HCl ER 21 MG CP24 Take 21 mg by mouth 1 day or 1 dose. 08/30/14   Delia HeadyPramod Sethi, MD  metFORMIN (GLUCOPHAGE) 500 MG tablet Take 500 mg by mouth 2 (two) times daily with a meal.     Historical Provider, MD  pantoprazole (PROTONIX) 40 MG tablet Take 40 mg by mouth every morning.     Historical Provider, MD  rosuvastatin (CRESTOR) 5 MG tablet Take 5 mg by mouth every Monday, Wednesday, and Friday.    Historical Provider, MD   There were no vitals taken for this visit. Physical Exam  Constitutional: No distress.  HENT:  Head: Normocephalic and atraumatic.  Right Ear: External ear normal.  Left Ear: External ear normal.  Eyes: Conjunctivae are normal. Right eye exhibits no discharge. Left eye exhibits no discharge. No scleral icterus.  Neck: Neck supple. No tracheal deviation present.  Cardiovascular: Normal rate, regular rhythm and intact distal pulses.   Pulmonary/Chest: Effort normal and breath sounds normal. No stridor. No respiratory distress. She has no wheezes. She has no rales.  Abdominal: Soft. Bowel sounds are normal. She exhibits no distension. There is no tenderness. There is no rebound and no guarding.  Musculoskeletal: She exhibits  edema (bilateral lower extrem). She exhibits no tenderness.  Neurological: She is alert. She has normal strength. No cranial nerve deficit (no facial droop, extraocular movements intact, no slurred speech) or sensory deficit. She exhibits normal muscle tone. She displays no seizure activity. Coordination normal.  Right sided weakness, arm and leg; right sided facial droop,  pt does not speak, please see Neurology note for full exam  Skin: Skin is warm and dry. No rash noted. She is not diaphoretic.  Psychiatric: She has a normal mood and affect.  Nursing note and vitals reviewed.   ED Course  Procedures (including critical care time) Labs  Review Labs Reviewed  CBC - Abnormal; Notable for the following:    RBC 3.19 (*)    Hemoglobin 8.4 (*)    HCT 26.9 (*)    RDW 16.0 (*)    All other components within normal limits  I-STAT CHEM 8, ED - Abnormal; Notable for the following:    Potassium 6.0 (*)    BUN 35 (*)    Creatinine, Ser 1.60 (*)    Glucose, Bld 294 (*)    Hemoglobin 10.2 (*)    HCT 30.0 (*)    All other components within normal limits  ETHANOL  PROTIME-INR  APTT  DIFFERENTIAL  COMPREHENSIVE METABOLIC PANEL  URINE RAPID DRUG SCREEN (HOSP PERFORMED)  URINALYSIS, ROUTINE W REFLEX MICROSCOPIC  I-STAT TROPOININ, ED  I-STAT TROPOININ, ED    Imaging Review Ct Head Wo Contrast  10/23/2014   CLINICAL DATA:  Code stroke. RIGHT-sided weakness. Initial encounter.  EXAM: CT HEAD WITHOUT CONTRAST  TECHNIQUE: Contiguous axial images were obtained from the base of the skull through the vertex without intravenous contrast.  COMPARISON:  MR head 09/07/2013.  CT head 09/07/2013.  FINDINGS: No evidence for acute infarction, hemorrhage, mass lesion, hydrocephalus, or extra-axial fluid. Generalized atrophy. Extensive white matter hypoattenuation, likely chronic microvascular ischemic change. No visible proximal large vessel occlusion. Advanced vascular calcification in the carotid siphons. Calvarium intact. No sinus or mastoid disease.  IMPRESSION: Chronic changes as described.  Similar appearance to 2015.  Critical Value/emergent results were called by telephone at the time of interpretation on 10/23/2014 at 8:28 am to Dr. Amada Jupiter, who verbally acknowledged these results.   Electronically Signed   By: Davonna Belling M.D.   On: 10/23/2014 08:29     EKG Interpretation   Date/Time:  Wednesday October 23 2014 08:30:15 EST Ventricular Rate:  81 PR Interval:  203 QRS Duration: 71 QT Interval:  366 QTC Calculation: 425 R Axis:   6 Text Interpretation:  Sinus rhythm Low voltage, extremity and precordial  leads No significant change  since last tracing Confirmed by Lataja Newland  MD-J,  Om Lizotte ([id number]) on 10/23/2014 8:32:53 AM      MDM   Final diagnoses:  Hyperkalemia  Dehydration    Pt was seen by the Stroke team.  Dr Amada Jupiter.  Pt is not a TPA candidate and is not a candidate for other intervention.  Plan on admission by medical team.  MRI and further evaluation.  Most recent BP 99/38.  Pt may be volume depleted.  Will give fluids bolus. Doubt cardiac etiology.  EKG and troponin are normal. No fever.  Doubt sepsis but waiting on UA to check for UTI. Potassium increased on istat.  CMET has been ordered.  Will check for hemolysis and repeat k.   Linwood Dibbles, MD 10/23/14 1914  Linwood Dibbles, MD 10/23/14 [phone]

## 2014-10-23 NOTE — ED Notes (Signed)
Pt answer questions for family upon arrival. Family states that when pt woke this morning and was leaning to the left and was weak. Reports rt sided weakness and had difficulty standing. Family states that pt has had "episodes" like this in the past and is "okay in a day or so"

## 2014-10-23 NOTE — Progress Notes (Signed)
HO Goodrich paged patient is allergic to lipitor and crestor is ordered asked to d/c order for crestor (statin) unless otherwise indicated, and if it is to please call.

## 2014-10-23 NOTE — Consult Note (Signed)
Neurology Consultation Reason for Consult: code stroke Referring Physician: Roselyn BeringKnapp, J  CC: Code stroke  History is obtained from:medical record, EMS  HPI: Megan [name] is a 81 y.o. female with a history of previous pontine stroke with subsequent right hemiparesis who presents with "worsening of symptoms" of unclear etiology. It is unclear exactly what symptoms have worsened, but given her appearance and being within the 8 hour window a code stroke was called.   On arrival, She had right hemiparesis and aphasia.    LKW: midnight tpa given?: no, out of window.     ROS:  Unable to obtain due to altered mental status.   Past Medical History  Diagnosis Date  . Stroke   . Diabetes mellitus   . Hyperlipidemia   . Seizure     Family History: Unable to assess secondary to patient's altered mental status.    Social History: Tob: Unable to assess secondary to patient's altered mental status.    Exam: Current vital signs: There were no vitals taken for this visit. Vital signs in last 24 hours:     Physical Exam  Constitutional: Appears elderly Psych: Affect appropriate to situation Eyes: No scleral injection HENT: No OP obstrucion Head: Normocephalic.  Cardiovascular: Normal rate and regular rhythm.  Respiratory: Effort normal and breath sounds normal to anterior ascultation GI: Soft.  No distension. There is no tenderness.  Ext: severe edema with weaping.   Neuro: Mental Status: Patient is awake, alert, fixates and tracks. Does not follow commands or speak Cranial Nerves: II: Pupils are equal, round, and reactive to light.  She does not reliably blink form the right.  III,IV, VI: EOMI without ptosis or diploplia.  V: Facial sensation is decreased on right.  VII: Facial movement is right droop.  VIII, X, XI, XII: Unable to assess secondary to patient's altered mental status.  Motor: Tone is normal. Bulk is normal. 5/5 strength was present on the left, spastic  hemiparesis on right.  Sensory: Sensation is decreased on right.  Cerebellar: Does not perform         I have reviewed labs in epic and the results pertinent to this consultation are: ua - neg hyperK Renal dysfunction.   I have reviewed the images obtained:MRI brain - no actue infarct.   Impression: 81 yo F with AMS in the setting of likely dehydration and renal dysfunction. I would favor EEG to rule out ongoing seizure, but feel that this is low likely hood.   Recommendations: 1) EEG 2) continue depakote at home dose.  3) will continue to follow.    Ritta SlotMcNeill Nain Rudd, MD [facility] [phone]  If 7pm- 7am, please page neurology on call as listed in AMION.

## 2014-10-23 NOTE — Progress Notes (Signed)
NURSING PROGRESS NOTE  Megan PersonRuby [name] [id number] Admission Data: 10/23/2014 1:35 PM Attending Provider: Standley Brookinganiel P Goodrich, MD WUJ:WJXBJYN,WGNFPCP:GRIFFIN,JOHN Jomarie LongsJOSEPH, MD Code Status: DNR  Allergies:  Lipitor Past Medical History:   has a past medical history of Stroke; Diabetes mellitus; Hyperlipidemia; and Seizure. Past Surgical History:   has past surgical history that includes No past surgeries. Social History:   reports that she has never smoked. She has quit using smokeless tobacco. Her smokeless tobacco use included Snuff. She reports that she does not drink alcohol or use illicit drugs.  Megan PersonRuby [name] is a 81 y.o. female patient admitted from ED:   Last Documented Vital Signs: Blood pressure 118/43, pulse 76, temperature 98.7 F (37.1 C), temperature source Rectal, resp. rate 16, SpO2 100 %.  Cardiac Monitoring: Box #8 in place. Cardiac monitor yields: No current Data.  IV Fluids:  IV in place, occlusive dsg intact without redness, IV cath forearm left, condition patent and no redness normal saline.   Skin: wound care nurse currently evaluating at bedside upon admission.  Patient/Family orientated to room. Information packet given to patient/family. Admission inpatient armband information verified with patient/family to include name and date of birth and placed on patient arm. Side rails up x 2, fall assessment and education completed with patient/family. Patient/family able to verbalize understanding of risk associated with falls and verbalized understanding to call for assistance before getting out of bed. Call light within reach. Patient/family able to voice and demonstrate understanding of unit orientation instructions.    Will continue to evaluate and treat per MD orders.   Leane PlattSpencer Jasiel Belisle RN, BS, BSN

## 2014-10-23 NOTE — ED Notes (Signed)
Admitting MD at bedside.

## 2014-10-23 NOTE — Progress Notes (Signed)
EEG completed, results pending. 

## 2014-10-23 NOTE — H&P (Signed)
[facility] History and Physical                                                                                    Megan [name], is a 81 y.o. female  MRN: [id number]   DOB - [birth year]  Admit Date - 10/23/2014  Outpatient Primary MD for the patient is Lillia Mountain, MD  With History of -  Past Medical History  Diagnosis Date  . Stroke   . Diabetes mellitus   . Hyperlipidemia   . Seizure       Past Surgical History  Procedure Laterality Date  . No past surgeries      in for   Chief Complaint  Patient presents with  . Code Stroke     HPI Megan [name]  is a 81 y.o. female, history of prior CVA in 2013 with residual right-sided deficits, partial seizure disorder, dementia, diabetes on metformin, and hypertension. Patient was last seen normal at midnight when she went to bed. Patient apparently awakened early this morning and seemed to have worse right-sided deficits compared to baseline. Patient also has had a significant nosebleed in the past 24 hours and had to seek medical attention, subsequent nosebleed has resolved. She is on Plavix at home. History obtained from patient's daughter initially at bedside while patient was in MRI. Patient had been going through one of her cycles of increased sleepiness and poor oral intake. She seemed to be at baseline before going to bed but when awakened this morning was noted to be quite weak on the right side. Therefore patient was brought to the ER.  In the ER patient was also examined by the neurology team. Due to presentation outside window for intervention is not a TPA candidate. Her CT of the head was unremarkable and an MRI was pending. Lab work revealed the patient may be somewhat dehydrated. The potassium was 6.0, BUN 34 and creatinine 1.73 with baseline BUN 18 creatinine 1.09. Patient's CBGs were elevated between 288 and 294. Patient has been given a 500 mL normal saline fluid challenge in the ER. Her blood pressure was somewhat  soft ranging between 99 to 113 systolic.  In further discussion with daughter at bedside patient has been nonambulatory for one year. She typically sleeps in a recliner due to sensations that she feels that she is choking when sleeping supine. She has not observed the patient experiencing overt choking or coughing with eating solids but was noted to cough occasionally when drinking thin liquids. Patient has not had any definitive weight loss and her dentures still fit. Daughter at bedside also reports that her sister Dennie Bible is the primary caretaker for the patient.  Later returned to examine the patient after she returned from MRI noting patient had received Ativan for the procedure. Daughter Dennie Bible at bedside and have clarified above history. For the past 1 year patient has had which she describes as "spells" which are either related to bouts of sleepiness and inactivity and/or seizure activity. She reports that the neurologist has been adjusting medications for her seizure disorder as well as for dementia. She states for the past 36 hours patient has had progressive  lethargy. For about 5 days she's noted her sugars increasing. The patient has had decreased urinary output for the past 2 days. She states typically the patient eats pretty well but they do have to push fluids. Patient apparently did choke on clear liquids yesterday and had emesis directly related to that but no reports of nausea or vomiting prior to that or since then. She also reports that typically she is able to assist the patient to standing and that in the past 24 hours patient appeared to be much weaker and was unable to help better weight on her right side which was typical for her. She clarified patient's nosebleed was on Monday, the nosebleeds are chronic, recent nosebleed improved after pressure and utilization of Afrin.  Review of Systems   In addition to the HPI above,  No Fever-chills, myalgias or other constitutional symptoms No  apparent Chest pain, Cough or Shortness of Breath, palpitations, orthopnea or DOE No Abdominal pain, N/V; no melena or hematochezia, no dark tarry stools, Bowel movements are regular, No dysuria, hematuria or flank pain No new skin rashes, lesions, masses or bruises, No new joints pains-aches No recent weight gain or loss No polyuria, polydypsia or polyphagia,  *A full 10 point Review of Systems was done, except as stated above, all other Review of Systems were negative.  Social History History  Substance Use Topics  . Smoking status: Never Smoker   . Smokeless tobacco: Former Neurosurgeon    Types: Snuff  . Alcohol Use: No    Family History Family History  Problem Relation Age of Onset  . CAD Mother   . Hypertension Mother   . Diabetes Mother   . Stroke Mother     Prior to Admission medications   Medication Sig Start Date End Date Taking? Authorizing Provider  clopidogrel (PLAVIX) 75 MG tablet Take 1 tablet (75 mg total) by mouth daily with breakfast. 08/15/13  Yes Lillia Mountain, MD  divalproex (DEPAKOTE ER) 500 MG 24 hr tablet Take 1 tablet (500 mg total) by mouth at bedtime. 02/27/14  Yes Ronal Fear, NP  Memantine HCl ER 21 MG CP24 Take 21 mg by mouth 1 day or 1 dose. 08/30/14  Yes Delia Heady, MD  metFORMIN (GLUCOPHAGE) 500 MG tablet Take 500 mg by mouth 2 (two) times daily with a meal.    Yes Historical Provider, MD  pantoprazole (PROTONIX) 40 MG tablet Take 40 mg by mouth every morning.    Yes Historical Provider, MD  rosuvastatin (CRESTOR) 5 MG tablet Take 5 mg by mouth every Monday, Wednesday, and Friday.    Historical Provider, MD    Allergies  Allergen Reactions  . Lipitor [Atorvastatin] Other (See Comments)    Myalgias     Physical Exam  Vitals  Blood pressure 113/49, pulse 81, temperature 99.3 F (37.4 C), temperature source Rectal, resp. rate 22, SpO2 100 %.   General:  In no acute distress, sedated after received Ativan for MRI  Psych: Unable to  adequately assess given recent sedation  Neuro:  Right facial droop. Able to stick out tongue which was midline, weaker on the right side with grip and movement 3 over 5 on the right was normal grip and movement on left. Right leg markedly diminished motor activity and did not observe patient able to wiggle toes when asked on command with right leg, noted right foot drop; speech and mentation difficult to assess accurately given recent administration of Ativan  ENT:  Ears and Eyes  appear Normal, Conjunctivae clear, PERL. Minimally dry oral mucosa without erythema or exudates.  Neck:  Supple, No lymphadenopathy appreciated  Respiratory:  Symmetrical chest wall movement, Good air movement bilaterally, CTAB. Room Air  Cardiac:  RRR, No Murmurs, bilateral chronic brawny LE edema noted with right greater than left, no JVD, No carotid bruits, peripheral pulses palpable at 2+  Abdomen:  Positive bowel sounds, Soft, Non tender, Non distended,  No masses appreciated, no obvious hepatosplenomegaly  Skin:  No Cyanosis, Normal Skin Turgor, No Skin Rash or Bruise. Chronic stasis dermatitis bilateral lower extremities right worse than left  Extremities: Symmetrical without obvious trauma or injury,  no effusions.  Data Review  CBC  Recent Labs Lab 10/23/14 0800 10/23/14 0811  WBC 7.5  --   HGB 8.4* 10.2*  HCT 26.9* 30.0*  PLT 227  --   MCV 84.3  --   MCH 26.3  --   MCHC 31.2  --   RDW 16.0*  --   LYMPHSABS 2.7  --   MONOABS 0.6  --   EOSABS 0.0  --   BASOSABS 0.0  --     Chemistries   Recent Labs Lab 10/23/14 0800 10/23/14 0811  NA 136 138  K 6.0* 6.0*  CL 101 105  CO2 23  --   GLUCOSE 288* 294*  BUN 34* 35*  CREATININE 1.73* 1.60*  CALCIUM 9.5  --   AST 17  --   ALT 9  --   ALKPHOS 80  --   BILITOT 0.4  --     CrCl cannot be calculated (Unknown ideal weight.).  No results for input(s): TSH, T4TOTAL, T3FREE, THYROIDAB in the last 72 hours.  Invalid input(s):  FREET3  Coagulation profile  Recent Labs Lab 10/23/14 0800  INR 1.13    No results for input(s): DDIMER in the last 72 hours.  Cardiac Enzymes No results for input(s): CKMB, TROPONINI, MYOGLOBIN in the last 168 hours.  Invalid input(s): CK  Invalid input(s): POCBNP  Urinalysis    Component Value Date/Time   COLORURINE YELLOW 04/24/2014 1733   APPEARANCEUR CLEAR 04/24/2014 1733   LABSPEC 1.006 04/24/2014 1733   PHURINE 7.5 04/24/2014 1733   GLUCOSEU 100* 04/24/2014 1733   HGBUR NEGATIVE 04/24/2014 1733   BILIRUBINUR NEGATIVE 04/24/2014 1733   KETONESUR NEGATIVE 04/24/2014 1733   PROTEINUR NEGATIVE 04/24/2014 1733   UROBILINOGEN 0.2 04/24/2014 1733   NITRITE NEGATIVE 04/24/2014 1733   LEUKOCYTESUR MODERATE* 04/24/2014 1733    Imaging results:   Ct Head Wo Contrast  10/23/2014   CLINICAL DATA:  Code stroke. RIGHT-sided weakness. Initial encounter.  EXAM: CT HEAD WITHOUT CONTRAST  TECHNIQUE: Contiguous axial images were obtained from the base of the skull through the vertex without intravenous contrast.  COMPARISON:  MR head 09/07/2013.  CT head 09/07/2013.  FINDINGS: No evidence for acute infarction, hemorrhage, mass lesion, hydrocephalus, or extra-axial fluid. Generalized atrophy. Extensive white matter hypoattenuation, likely chronic microvascular ischemic change. No visible proximal large vessel occlusion. Advanced vascular calcification in the carotid siphons. Calvarium intact. No sinus or mastoid disease.  IMPRESSION: Chronic changes as described.  Similar appearance to 2015.  Critical Value/emergent results were called by telephone at the time of interpretation on 10/23/2014 at 8:28 am to Dr. Amada Jupiter, who verbally acknowledged these results.   Electronically Signed   By: Davonna Belling M.D.   On: 10/23/2014 08:29     EKG: Sinus rhythm without any acute ischemic changes, biphasic P wave in lead 2, subtle  peaking in T waves but not definitive   Assessment & Plan  Active  Problems:   Acute encephalopathy/History of CVA w/residual right side deficits -Admit to telemetry unit -Follow up on MRI -Appreciate neurology assistance -Suspect likely has metabolic etiology with superimposed issues related to underlying dementia (see below) -PT/OT/speech therapy noting patient may have underlying progressive dysphagia related to her dementia -Reported epistaxis in the past 24 hours but not apparently actively bleeding and hemoglobin stable so will continue Plavix for now; platelets are greater than 200,000    Acute renal failure superimposed on stage 3 chronic kidney disease -Baseline renal function BUN 18 creatinine 1.09 -Current renal function BUN 34 creatinine 1.73 -IV fluid hydration at 100 mL per hour; last echocardiogram 2014 with preserved LV function and grade 1 diastolic dysfunction -Follow labs -Avoid offending medications; note patient was on metformin prior to admission and this will be discontinued -Renal function does not improve with hydration and consider renal ultrasound to rule out obstructive process    Acute hyperkalemia -Likely related to acute renal failure and volume depletion -Hold metformin as above -For cardioprotective effects give 1 amp D50 with 10 units IV insulin and 1 amp of calcium gluconate -Repeat BMET at 12 noon after hydration -Suspect dehydration as primary etiology would like to avoid utilization of Kayexalate if possible    Dehydration -As above -Etiology seems to be related to patient's waxing and waning issues regarding underlying dementia with previous stroke -Speech therapy evaluation for possible underlying undiagnosed dysphagia    Diabetes mellitus type II, uncontrolled -Current CBGs persistently greater than 250 -Discontinue metformin in setting of chronic kidney disease and recurrent acute renal failure; given patient's low GFR she is not a candidate to resume this medication at discharge and would likely benefit from  utilization of other agents preferably low-dose long-acting insulin -Begin sensitive sliding scale insulin   Abnormal urinalysis -Patient has low-grade elevation in temperature without leukocytosis -Abnormal urinalysis simply could be a reflection of acute renal failure and volume depletion -Check urine culture -No indication for empiric antibiotics at this juncture    Dementia with Lewy bodies/h/o partial seizures -Followed by Dr. Pearlean Brownie as an outpatient noting was last evaluated January 2016 -Documented that had tolerated increase Namenda dose from previous visit October 2015 -Also noted at that time her potassium was high normal -Given patient's reported issues of being non ambulatory for at least one year and waxing and waning mentation associated with poor oral intake that is suggestive of progression of dementia -Continue valproic acid-check level      HTN  -Current blood pressure is soft -Patient was not on anti hypertensive medication prior to admission    Protein-calorie malnutrition, severe -Swallowing evaluation as above -Provide appropriate supplementation with Glucerna once cleared for POs    Anemia -Baseline hemoglobin around 9.5 and currently hemoglobin slightly above that suggestive of hemoconcentration from volume depletion    Buttock wounds -Described by daughter; did not examine -Sounds like either skin tears or stage II decubiti -We'll ask wound care nurse to examined and document and recommend treatment   DVT Prophylaxis: Subcutaneous heparin  Family Communication:  Daughter at bedside   Code Status:  After extensive discussion at the bedside with daughter Dennie Bible per attending physician patient is now a DO NOT RESUSCITATE  Condition:  Stable  Time spent in minutes : 60   ELLIS,ALLISON L. ANP on 10/23/2014 at 9:15 AM  Between 7am to 7pm - Pager - [phone]  After 7pm go to  [email or web address] - password [email or web address]  And look for the night coverage person covering  me after hours  [facility]

## 2014-10-24 DIAGNOSIS — I6789 Other cerebrovascular disease: Secondary | ICD-10-CM

## 2014-10-24 DIAGNOSIS — I639 Cerebral infarction, unspecified: Secondary | ICD-10-CM

## 2014-10-24 DIAGNOSIS — E1165 Type 2 diabetes mellitus with hyperglycemia: Secondary | ICD-10-CM

## 2014-10-24 LAB — HEMOGLOBIN A1C
HEMOGLOBIN A1C: 8.3 % — AB (ref 4.8–5.6)
MEAN PLASMA GLUCOSE: 192 mg/dL

## 2014-10-24 LAB — GLUCOSE, CAPILLARY
GLUCOSE-CAPILLARY: 192 mg/dL — AB (ref 70–99)
Glucose-Capillary: 179 mg/dL — ABNORMAL HIGH (ref 70–99)
Glucose-Capillary: 192 mg/dL — ABNORMAL HIGH (ref 70–99)
Glucose-Capillary: 210 mg/dL — ABNORMAL HIGH (ref 70–99)
Glucose-Capillary: 224 mg/dL — ABNORMAL HIGH (ref 70–99)

## 2014-10-24 LAB — LIPID PANEL
CHOL/HDL RATIO: 3.3 ratio
Cholesterol: 131 mg/dL (ref 0–200)
HDL: 40 mg/dL (ref >39)
LDL Cholesterol: 77 mg/dL (ref 0–99)
Triglycerides: 70 mg/dL (ref <150)
VLDL: 14 mg/dL (ref 0–40)

## 2014-10-24 MED ORDER — WHITE PETROLATUM GEL
Status: AC
  Administered 2014-10-24: 17:00:00
  Filled 2014-10-24: qty 1

## 2014-10-24 NOTE — Progress Notes (Signed)
*  PRELIMINARY RESULTS* Vascular Ultrasound Carotid Duplex (Doppler) has been completed.  Preliminary findings: Bilateral:  1-39% ICA stenosis.  Vertebral artery flow is antegrade.      Farrel DemarkJill Eunice, RDMS, RVT  10/24/2014, 10:15 AM

## 2014-10-24 NOTE — Progress Notes (Signed)
Utilization review completed.  

## 2014-10-24 NOTE — Progress Notes (Signed)
PT Cancellation Note  Patient Details Name: Megan [name] MRN: [id number] DOB: 1921-10-20   Cancelled Treatment:    Reason Eval/Treat Not Completed: Patient not medically ready Pt has active bedrest orders. PT will hold until pt medically ready for therapy. Please update activity orders when pt is appropriate for therapy. Thank you.    Berton MountBarbour, Minaal Struckman S 10/24/2014, 8:26 AM Charlsie MerlesLogan Secor Anglea Gordner, [place]PT [phone]

## 2014-10-24 NOTE — Progress Notes (Signed)
OT Cancellation Note  Patient Details Name: Megan [name] MRN: [id number] DOB: 07/14/1922   Cancelled Treatment:    Reason Eval/Treat Not Completed: Patient not medically ready. Pt has active bedrest orders. OT will hold until pt medically ready for therapy. Please update activity orders when pt is appropriate for therapy. Thank you.   Nena JordanMiller, Bertine Schlottman M   Carney LivingLeeAnn Marie Burrell Hodapp, OTR/L Occupational Therapist [phone] (pager)  10/24/2014, 8:19 AM

## 2014-10-24 NOTE — Evaluation (Signed)
Physical Therapy Evaluation Patient Details Name: Megan [name] MRN: [id number] DOB: Mar 20, 1922 Today's Date: 10/24/2014   History of Present Illness  81 year old woman with history of pontine stroke 2013 with right-sided weakness last seen normal midnight 3/2 presented approximately 8 hours later to ED for increased right-sided weakness above baseline. Hx of seizure disorder, DM, and dementia.  Clinical Impression  Pt admitted with the above complications. Pt currently with functional limitations due to the deficits listed below (see PT Problem List). Patient currently requires mod-max assist +2 for safe mobility. Family reports she was able to squat pivot transfer at home with assist of 1 person. Feel she would benefit from short term rehab prior to returning home due to her increased weakness and resulting decline in functional ability. Pt will benefit from skilled PT to increase their independence and safety with mobility to allow discharge to the venue listed below.       Follow Up Recommendations SNF    Equipment Recommendations  None recommended by PT    Recommendations for Other Services       Precautions / Restrictions Precautions Precautions: Fall Restrictions Weight Bearing Restrictions: No      Mobility  Bed Mobility Overal bed mobility: Needs Assistance Bed Mobility: Supine to Sit     Supine to sit: Max assist;+2 for physical assistance;HOB elevated     General bed mobility comments: Max assist for truncal support +2 for LE support. Cues for technique. Pt able to slowly bring LEs towards edge of bed but required assist to complete.  Transfers Overall transfer level: Needs assistance Equipment used: 2 person hand held assist Transfers: Sit to/from [employer] Sit to Stand: Mod assist;+2 physical assistance Stand pivot transfers: Mod assist;+2 physical assistance       General transfer comment: Mod assist for boost and balance to stand. pt leaning  towards her right side. Cues for technique with little response. Mod assist +2 for stability with pivot from bed to chair, chair to BSC, and BSC back to chair. Took very small pivotal steps towards left  during one transfer but poorly moves LEs with remainder of pivots.  Ambulation/Gait                Stairs            Wheelchair Mobility    Modified Rankin (Stroke Patients Only) Modified Rankin (Stroke Patients Only) Pre-Morbid Rankin Score: Severe disability Modified Rankin: Severe disability     Balance Overall balance assessment: Needs assistance Sitting-balance support: Bilateral upper extremity supported Sitting balance-Leahy Scale: Poor   Postural control: Right lateral lean Standing balance support: Bilateral upper extremity supported Standing balance-Leahy Scale: Zero (Mod assist but +2)                               Pertinent Vitals/Pain Pain Assessment: Faces Faces Pain Scale: No hurt Pain Intervention(s): Monitored during session    Home Living Family/patient expects to be discharged to:: Private residence Living Arrangements: Children Available Help at Discharge: Family;Available 24 hours/day Type of Home: House Home Access: Level entry     Home Layout: One level Home Equipment: Wheelchair - manual;Shower seat;Bedside commode;Walker - 2 wheels      Prior Function Level of Independence: Needs assistance   Gait / Transfers Assistance Needed: Squat pivot transfers with assist     Comments: Daughter reports that family cares for patient and she is capable of transfering with pivot assist.  Hand Dominance   Dominant Hand:  (uses Lt hand more since previous stroke)    Extremity/Trunk Assessment   Upper Extremity Assessment: Defer to OT evaluation           Lower Extremity Assessment: Difficult to assess due to impaired cognition         Communication   Communication: Expressive difficulties (Difficult to  understand)  Cognition Arousal/Alertness: Awake/alert Behavior During Therapy: Flat affect Overall Cognitive Status: History of cognitive impairments - at baseline                      General Comments General comments (skin integrity, edema, etc.): Daughter present and able to answer questions during therapy eval.    Exercises General Exercises - Lower Extremity Ankle Circles/Pumps: AAROM;Both;10 reps;Supine Long Arc Quad: Strengthening;AAROM;Both;5 reps;Seated      Assessment/Plan    PT Assessment Patient needs continued PT services  PT Diagnosis Difficulty walking;Generalized weakness;Hemiplegia dominant side   PT Problem List Decreased strength;Decreased range of motion;Decreased activity tolerance;Decreased balance;Decreased coordination;Decreased mobility;Decreased knowledge of use of DME;Decreased cognition;Decreased safety awareness;Decreased knowledge of precautions  PT Treatment Interventions DME instruction;Gait training;Functional mobility training;Therapeutic activities;Therapeutic exercise;Balance training;Neuromuscular re-education;Cognitive remediation;Patient/family education;Wheelchair mobility training   PT Goals (Current goals can be found in the Care Plan section) Acute Rehab PT Goals Patient Stated Goal: none stated PT Goal Formulation: With family Time For Goal Achievement: 11/07/14 Potential to Achieve Goals: Fair    Frequency Min 3X/week   Barriers to discharge        Co-evaluation               End of Session Equipment Utilized During Treatment: Gait belt Activity Tolerance: Patient limited by fatigue Patient left: in chair;with call bell/phone within reach;with family/visitor present Nurse Communication: Mobility status         Time: [id number] PT Time Calculation (min) (ACUTE ONLY): 28 min   Charges:   PT Evaluation $Initial PT Evaluation Tier I: 1 Procedure PT Treatments $Therapeutic Activity: 8-22 mins   PT G CodesBerton Mount:         Maybell Misenheimer S 10/24/2014, 4:22 PM Sunday SpillersLogan Secor Lamb[place], [place] CarolinaPT [phone]

## 2014-10-24 NOTE — Progress Notes (Signed)
\  Echocardiogram 2D Echocardiogram has been performed.  Dorothey BasemanReel, Mika Anastasi M 10/24/2014, 8:36 AM

## 2014-10-24 NOTE — Evaluation (Signed)
Speech Language Pathology Evaluation Patient Details Name: Megan PersonRuby [name] MRN: [id number] DOB: 03/23/1922 Today's Date: 10/24/2014 Time: [id number] SLP Time Calculation (min) (ACUTE ONLY): 26 min  Problem List:  Patient Active Problem List   Diagnosis Date Noted  . Acute renal failure superimposed on stage 3 chronic kidney disease 10/23/2014  . Dehydration 10/23/2014  . Acute hyperkalemia 10/23/2014  . Abnormal urinalysis 10/23/2014  . CVA (cerebral infarction) 10/23/2014  . Chest pain 04/24/2014  . Acute encephalopathy 08/09/2013  . Altered mental status 08/08/2013  . Dementia with Lewy bodies 08/07/2013  . Vascular dementia 08/07/2013  . Sleepiness 08/07/2013  . Anemia 07/21/2013  . Protein-calorie malnutrition, severe 07/15/2013  . Herpes zoster 07/13/2013  . Spastic hemiplegia affecting dominant side 06/25/2013  . History of CVA w/residual right side deficits 09/03/2012  . History of partial seizures   . TIA (transient ischemic attack) 01/06/2012  . Community acquired pneumonia 01/06/2012  . Diabetes mellitus type II, uncontrolled 01/06/2012  . HTN (hypertension) 01/06/2012  . H/O 01/06/2012   Past Medical History:  Past Medical History  Diagnosis Date  . Stroke   . Diabetes mellitus   . Hyperlipidemia   . Seizure    Past Surgical History:  Past Surgical History  Procedure Laterality Date  . Cataract extraction     HPI:  81 year old woman with history of pontine stroke 2013 with right-sided weakness last seen normal midnight 3/2 presented approximately 8 hours later to ED for increased right-sided weakness above baseline.  At baseline, pt eats a regular diet, is conversant. CT head no acute intracranial abnormality. Hx includes DM, seizure disorder, dementia.    Assessment / Plan / Recommendation Clinical Impression  Pt appears to be at her cognitive-linguistic baseline per daughter at bedside. At home she has 24/7 supervision and has her daughters complete household  responsibilities. MRI is negative for acute findings. No further SLP f/u indicated at this time.    SLP Assessment  Patient does not need any further Speech Lanaguage Pathology Services    Follow Up Recommendations  None    Pertinent Vitals/Pain Pain Assessment: No/denies pain (does not report pain)   SLP Goals  Patient/Family Stated Goal: daughter wants to make sure they aren't missing anything with her care  SLP Evaluation Prior Functioning  Cognitive/Linguistic Baseline: Baseline deficits Baseline deficit details: daughters take care of all higher level cognitive tasks (cooking/cleaning, medications, money manmagement) Type of Home: House  Lives With: Daughter Available Help at Discharge: Family;Available 24 hours/day   Cognition  Overall Cognitive Status: History of cognitive impairments - at baseline    Comprehension  Auditory Comprehension Overall Auditory Comprehension: Other (comment) (at baseline for basic conversation) Visual Recognition/Discrimination Discrimination: Not tested Reading Comprehension Reading Status:  (appropriate per daughter, makes lunch choices)    Expression Expression Primary Mode of Expression: Verbal Verbal Expression Overall Verbal Expression: Other (comment) (appears at baseline for basic communication)   Oral / Motor Oral Motor/Sensory Function Overall Oral Motor/Sensory Function: Appears within functional limits for tasks assessed Motor Speech Overall Motor Speech: Impaired at baseline (strained voice, impacted by dentition)       Maxcine HamLaura Paiewonsky, M.A. CCC-SLP [phone]  Maxcine Hamaiewonsky, Megan [name] 10/24/2014, 1:49 PM

## 2014-10-24 NOTE — Progress Notes (Signed)
[facility] PROGRESS NOTE  Megan [name] ZOX:[id number] DOB: 30-Apr-1922 DOA: 10/23/2014 PCP: Lillia MountainGRIFFIN,JOHN [name], MD Interim summary: 81- year old lady admitted for increased right sided weakness. She was admitted for evaluation of CVA.  Assessment/Plan: 1. Acute encephalopathy probably from acute renal failure from  dehydration, volume depletion and decreased po intake; - resolved with IV fluids. As per family at bedside, she is at her baseline. Her right sided weakness is also at baseline.  - stroke work up has been negative so far. MRi brain is negative for embolic stroke. Carotid duplex does not show significant stenosis. Echocardiogram revealed grade 2 diastolic dysfunction with good LVEF. No thrombus. EEG ordered by neurology revealed normal EEG without any epileptiform activity.  Recommend continue with 75 mg of plavix daily and continue with IV hydration for 24 more hours.    2. Acute on CKD stage 1: Her baseline creatinine is around 1.  On admission she was found to be in acute renal failure possibly from volume depletion and decreased po intake.  She was started on IV fluids and her renal parameters have improved.  UA is negative. Urine cultures are pending.  Recheck BMP tomorrow.    3. Uncontrolled diabetes mellitus: At home she is on metformin which was discontinued on admission.  Her hgba1c is around 8.3. Started her on SSI while inpatient. When her renal function improves, plan to increase the dose of metformin. As per the daughter at bedside, patient became hypoglycemic on other occasions and her PCP had discontinued some of her meds.  CBG (last 3)   Recent Labs  10/24/14 0820 10/24/14 1223 10/24/14 1710  GLUCAP 179* 224* 192*    Anemia: Normocytic.  Her baseline hemoglobin is around 8.5. Her hemoglobin is much better, suspect it will be back to her baseline.  Recheck in am.    Hypertension: Well controlled.   Dementia with h/o partial seizures: No  behavioral changes. Resume namenda and valproic acid. Level is within normal limits.   Stage 2 pressure ulcer over the inner buttocks;  Wound care consulted and recommendation given.       Code Status: DNR  Family Communication: family at bedside Disposition Plan: patient's daughter wants to take her home and possibly home when her renal function improves.    Consultants:  Neurology  Wound care  Physical therapy  Speech therarpy  Procedures:  MRI  CAROTID DUPLEX  Antibiotics: NONE HPI/Subjective: She is very hard of hearing, not very talkative. She was alert and awake but did not answer any questions.   Objective: Filed Vitals:   10/24/14 1403  BP: 137/52  Pulse: 83  Temp: 98.6 F (37 C)  Resp: 18    Intake/Output Summary (Last 24 hours) at 10/24/14 1844 Last data filed at 10/24/14 1452  Gross per 24 hour  Intake   1080 ml  Output   2600 ml  Net  -1520 ml   Filed Weights   10/23/14 1327  Weight: 62.188 kg (137 lb 1.6 oz)    Exam:   General:  Alert afebrile comfortable, very hard of hearing  Cardiovascular: s1s2,  Respiratory: clear to auscultation, no wheezing or rhonchi  Abdomen: soft non tender non distended bowel sounds head  Musculoskeletal: trace edema.   Data Reviewed: Basic Metabolic Panel:  Recent Labs Lab 10/23/14 0800 10/23/14 0811 10/23/14 1303  NA 136 138 139  K 6.0* 6.0* 4.9  CL 101 105 107  CO2 23  --  23  GLUCOSE 288* 294* 235*  BUN 34* 35* 32*  CREATININE 1.73* 1.60* 1.49*  CALCIUM 9.5  --  9.6   Liver Function Tests:  Recent Labs Lab 10/23/14 0800  AST 17  ALT 9  ALKPHOS 80  BILITOT 0.4  PROT 6.7  ALBUMIN 2.9*   No results for input(s): LIPASE, AMYLASE in the last 168 hours. No results for input(s): AMMONIA in the last 168 hours. CBC:  Recent Labs Lab 10/23/14 0800 10/23/14 0811  WBC 7.5  --   NEUTROABS 4.2  --   HGB 8.4* 10.2*  HCT 26.9* 30.0*  MCV 84.3  --   PLT 227  --    Cardiac  Enzymes: No results for input(s): CKTOTAL, CKMB, CKMBINDEX, TROPONINI in the last 168 hours. BNP (last 3 results) No results for input(s): BNP in the last 8760 hours.  ProBNP (last 3 results) No results for input(s): PROBNP in the last 8760 hours.  CBG:  Recent Labs Lab 10/23/14 2213 10/24/14 0022 10/24/14 0820 10/24/14 1223 10/24/14 1710  GLUCAP 226* 192* 179* 224* 192*    No results found for this or any previous visit (from the past 240 hour(s)).   Studies: Ct Head Wo Contrast  10/23/2014   CLINICAL DATA:  Code stroke. RIGHT-sided weakness. Initial encounter.  EXAM: CT HEAD WITHOUT CONTRAST  TECHNIQUE: Contiguous axial images were obtained from the base of the skull through the vertex without intravenous contrast.  COMPARISON:  MR head 09/07/2013.  CT head 09/07/2013.  FINDINGS: No evidence for acute infarction, hemorrhage, mass lesion, hydrocephalus, or extra-axial fluid. Generalized atrophy. Extensive white matter hypoattenuation, likely chronic microvascular ischemic change. No visible proximal large vessel occlusion. Advanced vascular calcification in the carotid siphons. Calvarium intact. No sinus or mastoid disease.  IMPRESSION: Chronic changes as described.  Similar appearance to 2015.  Critical Value/emergent results were called by telephone at the time of interpretation on 10/23/2014 at 8:28 am to Dr. Amada Jupiter, who verbally acknowledged these results.   Electronically Signed   By: Davonna Belling M.D.   On: 10/23/2014 08:29   Mr Brain Wo Contrast  10/23/2014   CLINICAL DATA:  Prior stroke in 2013 with residual right-sided weakness and facial droop, worse this morning.  EXAM: MRI HEAD WITHOUT CONTRAST  TECHNIQUE: Multiplanar, multiecho pulse sequences of the brain and surrounding structures were obtained without intravenous contrast.  COMPARISON:  Head CT 10/23/2014.  Brain MRI 09/07/2013.  FINDINGS: There is no evidence of acute infarct, intracranial hemorrhage, mass, midline  shift, or extra-axial fluid collection. There is moderate cerebral atrophy. Patchy and confluent T2 hyperintensities in the subcortical and deep cerebral white matter are similar to the prior MRI and nonspecific but compatible with moderate chronic small vessel ischemic disease. Chronic lacunar infarcts are again seen in the bilateral basal ganglia, thalami, and brainstem. A chronic lacunar infarct in the left thalamus is new from the prior MRI.  Prior bilateral cataract extraction is noted. Paranasal sinuses are clear. There is a small right mastoid effusion. Lack of visualization of a distal right vertebral artery flow void is unchanged. Other major intracranial vascular flow voids are preserved.  IMPRESSION: 1. No acute intracranial abnormality. 2. Moderate chronic small vessel ischemic disease and cerebral atrophy. 3. Chronic deep gray nuclei and brainstem lacunar infarcts as above.   Electronically Signed   By: Sebastian Ache   On: 10/23/2014 10:41    Scheduled Meds: . clopidogrel  75 mg Oral Q breakfast  . divalproex  500 mg Oral QHS  . heparin  5,000 Units  Subcutaneous 3 times per day  . insulin aspart  0-5 Units Subcutaneous QHS  . insulin aspart  0-9 Units Subcutaneous TID WC  . Memantine HCl ER  21 mg Oral Daily  . pantoprazole  40 mg Oral q morning - 10a  . rosuvastatin  5 mg Oral Q M,W,F-1800   Continuous Infusions: . sodium chloride 100 mL/hr at 10/24/14 1028    Principal Problem:   CVA (cerebral infarction) Active Problems:   Diabetes mellitus type II, uncontrolled   HTN (hypertension)   History of partial seizures   History of CVA w/residual right side deficits   Protein-calorie malnutrition, severe   Anemia   Dementia with Lewy bodies   Acute encephalopathy   Acute renal failure superimposed on stage 3 chronic kidney disease   Dehydration   Acute hyperkalemia   Abnormal urinalysis    Time spent: 25 minutes.     [facility]  [facility] Pager [phone] If  7PM-7AM, please contact night-coverage at [email or web address], password [facility] 10/24/2014, 6:44 PM  LOS: 1 day

## 2014-10-24 NOTE — Evaluation (Signed)
Occupational Therapy Evaluation Patient Details Name: Megan [name] MRN: [id number] DOB: 1922/04/13 Today's Date: 10/24/2014    History of Present Illness 81 year old woman with history of pontine stroke 2013 with right-sided weakness last seen normal midnight 3/2 presented approximately 8 hours later to ED for increased right-sided weakness above baseline. Hx of seizure disorder, DM, and dementia.   Clinical Impression   PTA pt lived at home and family completed ADLs with pt minimally assisting. Pt requires total A for ADLs and recommend +2 assist for functional transfers. Family is refusing SNF at this time. Family plans to take pt back home and continue to assist her as they have been. Pt is at baseline with ADLs and has no further acute OT needs.     Follow Up Recommendations  No OT follow up;Supervision/Assistance - 24 hour    Equipment Recommendations  None recommended by OT    Recommendations for Other Services       Precautions / Restrictions Precautions Precautions: Fall Restrictions Weight Bearing Restrictions: No      Mobility Bed Mobility Overal bed mobility: Needs Assistance Bed Mobility: Supine to Sit     Supine to sit: Max assist;+2 for physical assistance;HOB elevated     General bed mobility comments: Pt sitting in recliner when OT arrived.   Transfers Overall transfer level: Needs assistance Equipment used:  (gait belt assisted transfer) Transfers: Sit to/from Stand Sit to Stand: Max assist Stand pivot transfers: Mod assist;+2 physical assistance       General transfer comment: Max  A to stand from recliner. Right lateral lean. Pt does not respond well to cueing. Returned to sitting. Would recommend +2 for safe transfers.          ADL Overall ADL's : At baseline;Needs assistance/impaired                                       General ADL Comments: Pt requires total A for all ADLs from family at baseline.                 Pertinent Vitals/Pain Pain Assessment: Faces Faces Pain Scale: No hurt Pain Intervention(s): Monitored during session     Hand Dominance  (uses left hand since previous stroke)   Extremity/Trunk Assessment Upper Extremity Assessment Upper Extremity Assessment: Difficult to assess due to impaired cognition   Lower Extremity Assessment Lower Extremity Assessment: Difficult to assess due to impaired cognition       Communication Communication Communication: Expressive difficulties   Cognition Arousal/Alertness: Awake/alert Behavior During Therapy: Flat affect Overall Cognitive Status: History of cognitive impairments - at baseline                     General Comments       Exercises Exercises: General Lower Extremity     Shoulder Instructions      Home Living Family/patient expects to be discharged to:: Private residence Living Arrangements: Children Available Help at Discharge: Family;Available 24 hours/day Type of Home: House Home Access: Level entry     Home Layout: One level               Home Equipment: Wheelchair - manual;Shower seat;Bedside commode;Walker - 2 wheels      Lives With: Daughter    Prior Functioning/Environment Level of Independence: Needs assistance  Gait / Transfers Assistance Needed: w/c dependent with squat pivot transfers with mod/max assist ADL's /  Homemaking Assistance Needed: Family performs all ADLs. Pt is able to feed herself with her left hand minimally but family typically takes over.  Communication / Swallowing Assistance Needed: very HOH making communication difficult Comments: Daughter reports that family cares for patient and she is capable of transfering with pivot assist.    OT Diagnosis: Generalized weakness                End of Session Equipment Utilized During Treatment: Gait belt  Activity Tolerance: Patient tolerated treatment well Patient left: in chair;with call bell/phone within reach;with  family/visitor present   [id number] OT Time Calculation (min): 14 min Charges:  OT General Charges $OT Visit: 1 Procedure OT Evaluation $Initial OT Evaluation Tier I: 1 Procedure G-Codes:    Rae Lips Nov 08, 2014, 5:50 PM   Carney Living, OTR/L Occupational Therapist [phone] (pager)

## 2014-10-24 NOTE — Progress Notes (Signed)
NUTRITION BRIEF NOTE Pt identified due to Low Braden score  Wt Readings from Last 10 Encounters:  10/23/14 137 lb 1.6 oz (62.188 kg)  04/24/14 123 lb 1.6 oz (55.838 kg)  08/08/13 118 lb (53.524 kg)  07/13/13 118 lb 13.3 oz (53.9 kg)  06/25/13 125 lb (56.7 kg)  09/05/12 121 lb 8 oz (55.112 kg)  01/07/12 129 lb 6.6 oz (58.7 kg)   Body mass index is 25.07 kg/(m^2). Pt meets criteria for Overweight based on current BMI.  Current diet order is DYS III, patient consuming approximately >75% of meals at this time.  Labs- high glucose, BUN  Daughter, who is the pt's caregiver, was in room upon assessment. Daughter reported no significant wt loss or appetite changes pta. Per daughter, pt has been eating well, and has gained wt recently. She enjoys sweets, vegetables, and fruit. When offered Ensure, daughter did not think pt needed any at this time. NFPE did not reveal any body fat or muscle mass depletion; however, some edema was present in L/E.  Previous medical records indicate wt gain since December.   Observed daughter feed pt's breakfast, and saw no difficulty chewing or swallowing.  No nutrition interventions warranted at this time. If nutrition issues arise, please consult RD.  Cristela FeltElisa Clarrissa Shimkus, MS Dietetic Intern Pager: [phone]

## 2014-10-24 NOTE — Progress Notes (Signed)
Subjective: Daughter at bedside stating she is almost back to baseline.  She seems "just a little groggy".   Exam: Filed Vitals:   10/24/14 0438  BP: 137/55  Pulse: 78  Temp: 97.7 F (36.5 C)  Resp: 18   Gen: In bed, NAD General: Mental Status: Alert, awake, hard of hearing but follows simple commands easily when able to hear command Cranial Nerves: II:  Visual fields blinks to threat bilaterally, pupils equal, round, reactive to light and accommodation III,IV, VI: ptosis not present, extra-ocular motions intact bilaterally V,VII: facesymmetric, facial light touch sensation normal bilaterally VIII: hearing decreased bilaterally IX,X: gag reflex present XI: bilateral shoulder shrug XII: midline tongue extension without atrophy or fasciculations  Motor: Right : Upper extremity   4/5    Left:     Upper extremity   5/5  Moves the left leg >right leg (baseline) Tone and bulk:normal tone throughout; no atrophy noted Sensory: Pinprick and light touch intact throughout, bilaterally Deep Tendon Reflexes:  Right: Upper Extremity   Left: Upper extremity   biceps (C-5 to C-6) 2/4   biceps (C-5 to C-6) 2/4 tricep (C7) 2/4    triceps (C7) 2/4 Brachioradialis (C6) 2/4  Brachioradialis (C6) 2/4  0/4 reflexes bilateral LE  Plantars: Right: downgoing   Left: downgoing Cerebellar: normal finger-to-nose,  normal heel-to-shin test t    Pertinent Labs: Urine culture pending.   Glucose 179  EEG: This is a normal EEG recording during wakefulness and during sleep. No epileptiform activity was seen during this recording.  MRI IMPRESSION: 1. No acute intracranial abnormality. 2. Moderate chronic small vessel ischemic disease and cerebral atrophy. 3. Chronic deep gray nuclei and brainstem lacunar infarcts.   Impression: 81 yo F with AMS in the setting of likely dehydration and renal dysfunction. EEG and MRI both showed no abnormalities.  Patient now back to baseline per daughter.    Neurology will S/O  Felicie MornDavid Smith PA-C [facility] [phone]  10/24/2014, 11:43 AM

## 2014-10-25 LAB — BASIC METABOLIC PANEL
ANION GAP: 7 (ref 5–15)
BUN: 5 mg/dL — ABNORMAL LOW (ref 6–23)
CHLORIDE: 108 mmol/L (ref 96–112)
CO2: 23 mmol/L (ref 19–32)
CREATININE: 0.78 mg/dL (ref 0.50–1.10)
Calcium: 9.1 mg/dL (ref 8.4–10.5)
GFR calc non Af Amer: 71 mL/min — ABNORMAL LOW (ref >90)
GFR, EST AFRICAN AMERICAN: 83 mL/min — AB (ref >90)
Glucose, Bld: 265 mg/dL — ABNORMAL HIGH (ref 70–99)
Potassium: 4.2 mmol/L (ref 3.5–5.1)
SODIUM: 138 mmol/L (ref 135–145)

## 2014-10-25 LAB — GLUCOSE, CAPILLARY
GLUCOSE-CAPILLARY: 149 mg/dL — AB (ref 70–99)
GLUCOSE-CAPILLARY: 247 mg/dL — AB (ref 70–99)

## 2014-10-25 LAB — URINE CULTURE

## 2014-10-25 MED ORDER — POLYETHYLENE GLYCOL 3350 17 G PO PACK
17.0000 g | PACK | Freq: Every day | ORAL | Status: DC
  Administered 2014-10-25: 17 g via ORAL
  Filled 2014-10-25: qty 1

## 2014-10-25 MED ORDER — INSULIN ASPART 100 UNIT/ML ~~LOC~~ SOLN: SUBCUTANEOUS | Status: DC

## 2014-10-25 NOTE — Progress Notes (Signed)
Physical Therapy Treatment Patient Details Name: Megan [name] MRN: [id number] DOB: 08/18/1922 Today's Date: 10/25/2014    History of Present Illness 81 year old woman with history of pontine stroke 2013 with right-sided weakness last seen normal midnight 3/2 presented approximately 8 hours later to ED for increased right-sided weakness above baseline. Hx of seizure disorder, DM, and dementia.    PT Comments    Progressing very slowly.  Pt's family decided to take pt home today.  Discussion with daughter determined that they had all the necessary equipment to help their Mom safely.     Follow Up Recommendations  Home health PT     Equipment Recommendations  None recommended by PT    Recommendations for Other Services       Precautions / Restrictions Precautions Precautions: Fall    Mobility  Bed Mobility               General bed mobility comments: Pt sitting in recliner when PT arrived.   Transfers Overall transfer level: Needs assistance Equipment used: 2 person hand held assist Transfers: Sit to/from Stand Sit to Stand: Mod assist;+2 physical assistance         General transfer comment: Worked on safe transfer technique.  Assist pt to come forward and help power up with R LE support.  Ambulation/Gait                 Stairs            Wheelchair Mobility    Modified Rankin (Stroke Patients Only) Modified Rankin (Stroke Patients Only) Pre-Morbid Rankin Score: Severe disability Modified Rankin: Severe disability     Balance Overall balance assessment: Needs assistance Sitting-balance support: Single extremity supported;Bilateral upper extremity supported Sitting balance-Leahy Scale: Poor Sitting balance - Comments: unable to balance against the posterior angle of the seat cushion   Standing balance support: Bilateral upper extremity supported Standing balance-Leahy Scale: Zero Standing balance comment: Sit to stand x2, with standing  trials of 30-40 secs each working on upright stance with control of R knee and w/shifting                    Cognition Arousal/Alertness: Awake/alert Behavior During Therapy: Flat affect;WFL for tasks assessed/performed Overall Cognitive Status: History of cognitive impairments - at baseline                      Exercises General Exercises - Lower Extremity Hip Flexion/Marching: AROM;AAROM;10 reps;Seated (graded resistance)    General Comments        Pertinent Vitals/Pain Pain Assessment: Faces Faces Pain Scale: No hurt    Home Living                      Prior Function            PT Goals (current goals can now be found in the care plan section) Acute Rehab PT Goals Patient Stated Goal: none stated PT Goal Formulation: With family Time For Goal Achievement: 11/07/14 Potential to Achieve Goals: Fair Progress towards PT goals: Progressing toward goals    Frequency  Min 3X/week    PT Plan Discharge plan needs to be updated    Co-evaluation             End of Session   Activity Tolerance: Patient limited by fatigue Patient left: in chair;with call bell/phone within reach;with family/visitor present     Time: 2130-8657 PT Time Calculation (min) (ACUTE ONLY): 19  min  Charges:  $Therapeutic Activity: 8-22 mins                    G Codes:      Nailyn Dearinger, Eliseo GumKenneth V 10/25/2014, 4:56 PM 10/25/2014  [place] BingKen Chesnee Floren, PT [phone]  (pager)

## 2014-10-25 NOTE — Progress Notes (Signed)
Patient discharge teaching given to daughter, including activity, diet, follow-up appoints, and medications. Patient verbalized understanding of all discharge instructions. IV access was d/c'd. Vitals are stable. Skin is intact except as charted in most recent assessments. Pt to be escorted out by NT, to be driven home by family.

## 2014-10-25 NOTE — Discharge Summary (Signed)
Physician Discharge Summary  Megan [name] ZOX:[id number] DOB: 09/01/1921 DOA: 10/23/2014  PCP: Lillia Mountain, MD  Admit date: 10/23/2014 Discharge date: 10/25/2014  Time spent: 30  minutes  Recommendations for Outpatient Follow-up:  1. Follow up with PCP in one week  Discharge Diagnoses:  Principal Problem:   CVA (cerebral infarction) Active Problems:   Diabetes mellitus type II, uncontrolled   HTN (hypertension)   History of partial seizures   History of CVA w/residual right side deficits   Protein-calorie malnutrition, severe   Anemia   Dementia with Lewy bodies   Acute encephalopathy   Acute renal failure superimposed on stage 3 chronic kidney disease   Dehydration   Acute hyperkalemia   Abnormal urinalysis   Discharge Condition: improved  Diet recommendation: carb modified diet  Filed Weights   10/23/14 1327  Weight: 62.188 kg (137 lb 1.6 oz)    History of present illness:  81- year old lady admitted for increased right sided weakness. She was admitted for evaluation of CVA.   Hospital Course:  1. Acute encephalopathy probably from acute renal failure from dehydration, volume depletion and decreased po intake; - resolved with IV fluids. As per family at bedside, she is at her baseline. Her right sided weakness is also at baseline.  - stroke work up has been negative so far. MRi brain is negative for embolic stroke. Carotid duplex does not show significant stenosis. Echocardiogram revealed grade 2 diastolic dysfunction with good LVEF. No thrombus. EEG ordered by neurology revealed normal EEG without any epileptiform activity.  Recommend continue with 75 mg of plavix daily .   2. Acute on CKD stage 1: Her baseline creatinine is around 1.  On admission she was found to be in acute renal failure possibly from volume depletion and decreased po intake.  She was started on IV fluids and her renal parameters have improved. UA is negative. Urine cultures are pending.   Recheck BMP tomorrow.    3. Uncontrolled diabetes mellitus: At home she is on metformin which was discontinued on admission.  Her hgba1c is around 8.3. Started her on SSI while inpatient. When her renal function improves, plan to increase the dose of metformin. As per the daughter at bedside, patient became hypoglycemic on other occasions and her PCP had discontinued some of her meds.resume metformin.  CBG (last 3)   Recent Labs (last 2 labs)      Recent Labs  10/24/14 0820 10/24/14 1223 10/24/14 1710  GLUCAP 179* 224* 192*      Anemia: Normocytic.  Her baseline hemoglobin is around 8.5. Her hemoglobin is much better, suspect it will be back to her baseline.     Hypertension: Well controlled.   Dementia with h/o partial seizures: No behavioral changes. Resume namenda and valproic acid. Level is within normal limits.   Stage 2 pressure ulcer over the inner buttocks;  Wound care consulted and recommendation given.         Procedures:  MRI BRAIN  Carotid duplex  echocardiogram  Consultations:  neurology  Discharge Exam: Filed Vitals:   10/25/14 0511  BP: 138/55  Pulse:   Temp: 98.7 F (37.1 C)  Resp: 16    General: alert afebrile comfortable Cardiovascular: s1s2 Respiratory: ctab  Discharge Instructions   Discharge Instructions    Diet Carb Modified    Complete by:  As directed      Discharge instructions    Complete by:  As directed   Follow up with PCP in one week. Please  check cbgs atleast twice daily.          Current Discharge Medication List    START taking these medications   Details  insulin aspart (NOVOLOG) 100 UNIT/ML injection CBG 70 - 120: 0 units CBG 121 - 150: 1 unit CBG 151 - 200: 2 units CBG 201 - 250: 3 units CBG 251 - 300: 5 units CBG 301 - 350: 7 units CBG 351 - 400: 9 units Qty: 10 mL, Refills: 2      CONTINUE these medications which have NOT CHANGED   Details  clopidogrel (PLAVIX) 75 MG  tablet Take 1 tablet (75 mg total) by mouth daily with breakfast. Qty: 30 tablet, Refills: 11    divalproex (DEPAKOTE ER) 500 MG 24 hr tablet Take 1 tablet (500 mg total) by mouth at bedtime. Qty: 90 tablet, Refills: 2   Associated Diagnoses: Localization-related (focal) (partial) epilepsy and epileptic syndromes with complex partial seizures, with intractable epilepsy    Memantine HCl ER 21 MG CP24 Take 21 mg by mouth 1 day or 1 dose. Qty: 30 capsule, Refills: 3   Associated Diagnoses: Dementia, without behavioral disturbance    metFORMIN (GLUCOPHAGE) 500 MG tablet Take 500 mg by mouth 2 (two) times daily with a meal.     pantoprazole (PROTONIX) 40 MG tablet Take 40 mg by mouth every morning.     rosuvastatin (CRESTOR) 5 MG tablet Take 5 mg by mouth every Monday, Wednesday, and Friday.       Allergies  Allergen Reactions  . Lipitor [Atorvastatin] Other (See Comments)    Myalgias    Follow-up Information    Follow up with Lillia Mountain, MD. Schedule an appointment as soon as possible for a visit in 1 week.   Specialty:  Internal Medicine   Why:  post hospitalization visit. evaluation of diabetes and meds.    Contact information:   [address], [address] [place] [address] [phone]        The results of significant diagnostics from this hospitalization (including imaging, microbiology, ancillary and laboratory) are listed below for reference.    Significant Diagnostic Studies: Ct Head Wo Contrast  10/23/2014   CLINICAL DATA:  Code stroke. RIGHT-sided weakness. Initial encounter.  EXAM: CT HEAD WITHOUT CONTRAST  TECHNIQUE: Contiguous axial images were obtained from the base of the skull through the vertex without intravenous contrast.  COMPARISON:  MR head 09/07/2013.  CT head 09/07/2013.  FINDINGS: No evidence for acute infarction, hemorrhage, mass lesion, hydrocephalus, or extra-axial fluid. Generalized atrophy. Extensive white matter hypoattenuation,  likely chronic microvascular ischemic change. No visible proximal large vessel occlusion. Advanced vascular calcification in the carotid siphons. Calvarium intact. No sinus or mastoid disease.  IMPRESSION: Chronic changes as described.  Similar appearance to 2015.  Critical Value/emergent results were called by telephone at the time of interpretation on 10/23/2014 at 8:28 am to Dr. Amada Jupiter, who verbally acknowledged these results.   Electronically Signed   By: Davonna Belling M.D.   On: 10/23/2014 08:29   Mr Brain Wo Contrast  10/23/2014   CLINICAL DATA:  Prior stroke in 2013 with residual right-sided weakness and facial droop, worse this morning.  EXAM: MRI HEAD WITHOUT CONTRAST  TECHNIQUE: Multiplanar, multiecho pulse sequences of the brain and surrounding structures were obtained without intravenous contrast.  COMPARISON:  Head CT 10/23/2014.  Brain MRI 09/07/2013.  FINDINGS: There is no evidence of acute infarct, intracranial hemorrhage, mass, midline shift, or extra-axial fluid collection. There is moderate cerebral atrophy.  Patchy and confluent T2 hyperintensities in the subcortical and deep cerebral white matter are similar to the prior MRI and nonspecific but compatible with moderate chronic small vessel ischemic disease. Chronic lacunar infarcts are again seen in the bilateral basal ganglia, thalami, and brainstem. A chronic lacunar infarct in the left thalamus is new from the prior MRI.  Prior bilateral cataract extraction is noted. Paranasal sinuses are clear. There is a small right mastoid effusion. Lack of visualization of a distal right vertebral artery flow void is unchanged. Other major intracranial vascular flow voids are preserved.  IMPRESSION: 1. No acute intracranial abnormality. 2. Moderate chronic small vessel ischemic disease and cerebral atrophy. 3. Chronic deep gray nuclei and brainstem lacunar infarcts as above.   Electronically Signed   By: Sebastian AcheAllen  Grady   On: 10/23/2014 10:41     Microbiology: Recent Results (from the past 240 hour(s))  Urine culture     Status: None   Collection Time: 10/23/14  8:54 PM  Result Value Ref Range Status   Specimen Description URINE, CLEAN CATCH  Final   Special Requests NONE  Final   Colony Count   Final    >=100,000 COLONIES/ML Performed at [facility]    Culture   Final    Multiple bacterial morphotypes present, none predominant. Suggest appropriate recollection if clinically indicated. Performed at [facility]    Report Status 10/25/2014 FINAL  Final     Labs: Basic Metabolic Panel:  Recent Labs Lab 10/23/14 0800 10/23/14 0811 10/23/14 1303 10/25/14 1249  NA 136 138 139 138  K 6.0* 6.0* 4.9 4.2  CL 101 105 107 108  CO2 23  --  23 23  GLUCOSE 288* 294* 235* 265*  BUN 34* 35* 32* 5*  CREATININE 1.73* 1.60* 1.49* 0.78  CALCIUM 9.5  --  9.6 9.1   Liver Function Tests:  Recent Labs Lab 10/23/14 0800  AST 17  ALT 9  ALKPHOS 80  BILITOT 0.4  PROT 6.7  ALBUMIN 2.9*   No results for input(s): LIPASE, AMYLASE in the last 168 hours. No results for input(s): AMMONIA in the last 168 hours. CBC:  Recent Labs Lab 10/23/14 0800 10/23/14 0811  WBC 7.5  --   NEUTROABS 4.2  --   HGB 8.4* 10.2*  HCT 26.9* 30.0*  MCV 84.3  --   PLT 227  --    Cardiac Enzymes: No results for input(s): CKTOTAL, CKMB, CKMBINDEX, TROPONINI in the last 168 hours. BNP: BNP (last 3 results) No results for input(s): BNP in the last 8760 hours.  ProBNP (last 3 results) No results for input(s): PROBNP in the last 8760 hours.  CBG:  Recent Labs Lab 10/24/14 1223 10/24/14 1710 10/24/14 2239 10/25/14 0811 10/25/14 1219  GLUCAP 224* 192* 210* 149* 247*       Signed:  Nickolaus Bordelon  [facility] 10/25/2014, 1:34 PM

## 2014-10-25 NOTE — Care Management Note (Signed)
    Page 1 of 1   10/25/2014     4:40:01 PM CARE MANAGEMENT NOTE 10/25/2014  Patient:  Megan [name],Megan [name]   Account Number:  [id number]  Date Initiated:  10/25/2014  Documentation initiated by:  Letha CapeAYLOR,Merranda Bolls  Subjective/Objective Assessment:   dx acute encephalopathy  admit- from home with daughter.     Action/Plan:   Anticipated DC Date:  10/25/2014   Anticipated DC Plan:  HOME W HOME HEALTH SERVICES      DC Planning Services  CM consult      [facility]   Choice offered to / List presented to:  C-4 Adult Children        HH arranged  HH-1 RN  HH-2 PT  HH-3 OT  HH-4 NURSE'S AIDE  HH-6 SOCIAL WORKER      HH agency  [facility]   Status of service:  Completed, signed off Medicare Important Message given?  YES (If response is "NO", the following Medicare IM given date fields will be blank) Date Medicare IM given:  10/25/2014 Medicare IM given by:  Letha CapeAYLOR,Cheney Gosch Date Additional Medicare IM given:   Additional Medicare IM given by:    Discharge Disposition:  HOME W HOME HEALTH SERVICES  Per UR Regulation:  Reviewed for med. necessity/level of care/duration of stay  If discussed at Long Length of Stay Meetings, dates discussed:    Comments:  10/25/14 1638 Letha CapeDeborah Talis Iwan RN,BSN [phone] [facility], aide and SW, [facility] notified. Soc will begin 24-48 hrs post dc.

## 2014-10-26 DIAGNOSIS — I639 Cerebral infarction, unspecified: Secondary | ICD-10-CM | POA: Insufficient documentation

## 2014-11-19 ENCOUNTER — Ambulatory Visit (INDEPENDENT_AMBULATORY_CARE_PROVIDER_SITE_OTHER): Payer: Medicare Other | Admitting: Podiatry

## 2014-11-19 ENCOUNTER — Encounter: Payer: Self-pay | Admitting: Podiatry

## 2014-11-19 VITALS — BP 163/45 | HR 91 | Resp 18

## 2014-11-19 DIAGNOSIS — L600 Ingrowing nail: Secondary | ICD-10-CM | POA: Diagnosis not present

## 2014-11-19 DIAGNOSIS — B351 Tinea unguium: Secondary | ICD-10-CM | POA: Diagnosis not present

## 2014-11-19 DIAGNOSIS — M79673 Pain in unspecified foot: Secondary | ICD-10-CM

## 2014-11-19 NOTE — Progress Notes (Signed)
   Subjective:    Patient ID: Megan [name], female    DOB: [birth year], 81 y.o.   MRN: [id number]  HPI the sitter let her fall out of the chair last Wednesday and the nail went back and can't wear shoes and we have used bandaids and soaked it, neosporin    Review of Systems  All other systems reviewed and are negative.      Objective:   Physical Exam        Assessment & Plan:

## 2014-11-20 NOTE — Progress Notes (Signed)
Subjective:     Patient ID: Megan [name], female   DOB: 1921-10-27, 81 y.o.   MRN: [id number]  HPI patient presents with loose damaged hallux nail left of which she does not remember exactly what happened and thick toenails 1-5 of both feet that she cannot cut and does have long-term diabetes with risk factors   Review of Systems     Objective:   Physical Exam Neurovascular status found to be intact with digital perfusion diminished but intact and hair growth not noted at the current time patient does have diminishment of sharp Dole vibratory and does have muscle strength loss at the current time but intact and normal for her age. She has a damage hallux nail left that is loose and not adhered to the proximal nail fold and thick nailbeds 1 through 5 of both feet that are painful when pressed dorsally    Assessment:     Damaged hallux nail left probable trauma with mycotic nail infection and pain 1-5 of both feet    Plan:     H&P performed and both conditions discussed with family. At this time under sterile conditions and under sterile technique the hallux nail left is removed entirely the nailbed was flushed and sterile dressing applied and I instructed on soaks and how to treat at home and debris did all remaining nails at this time with no iatrogenic bleeding noted

## 2014-12-18 ENCOUNTER — Encounter: Payer: Self-pay | Admitting: Neurology

## 2014-12-18 ENCOUNTER — Ambulatory Visit (INDEPENDENT_AMBULATORY_CARE_PROVIDER_SITE_OTHER): Payer: Medicare Other | Admitting: Neurology

## 2014-12-18 VITALS — BP 113/54 | HR 86

## 2014-12-18 DIAGNOSIS — F0390 Unspecified dementia without behavioral disturbance: Secondary | ICD-10-CM

## 2014-12-18 DIAGNOSIS — G40219 Localization-related (focal) (partial) symptomatic epilepsy and epileptic syndromes with complex partial seizures, intractable, without status epilepticus: Secondary | ICD-10-CM

## 2014-12-18 MED ORDER — MEMANTINE HCL ER 28 MG PO CP24: 28.0000 mg | ORAL_CAPSULE | Freq: Every day | ORAL | Status: DC

## 2014-12-18 MED ORDER — DIVALPROEX SODIUM ER 500 MG PO TB24: 500.0000 mg | ORAL_TABLET | Freq: Every day | ORAL | Status: DC

## 2014-12-18 NOTE — Patient Instructions (Signed)
I had a long discussion with the patient and daughter regarding her dementia and answered questions.Greater than 50% of the time of this  30 minute visit was spent in counseling. I recommend increasing Namenda XR to 28 mg daily if tolerated. I discussed side effects with the patient and daughter and asked him to call me if needed. Continue Depakote in the current dose.Patient was given refills. Return for follow-up in 3 months with Butch PennyMegan Millikan, nurse practitioner or call earlier if necessary.

## 2014-12-18 NOTE — Progress Notes (Signed)
PATIENT: Megan [name] DOB: November 06, 1921  REASON FOR VISIT: routine follow up for hx of stroke, partial seizure, dementia HISTORY FROM: caretaker  HISTORY OF PRESENT ILLNESS: Update 08/30/2014 : She returns for follow-up after last visit 3 months ago accompanied by her daughter who provides the history. Patient seems to be doing well and has tolerated increasing the dose of Namenda to 40 mg daily quite well. She still living at home with her -2 daughters splitting their time and providing total care. She spends most of the time in the wheelchair and the daughters provide some passive range of motion. She can swallow well and has good appetite. There have been no issues about behavior or agitation. There have been no hallucinations or delusions or confusion. She had lab work done after last visit and valproic acid level was 49 on 05/24/14 and potassium was slightly high at 5.8 but with normal creatinine and possibly the sample may have been hemolyzed. Update 05/24/2014 : She returns for followup of the last visit 3 months ago. She is taken today by her daughter who provides history. Patient seems to be doing about the same neurologically. She has not had any further unresponsive episodes but she continues to be sleepy during the day as well as sleep well at night. She has been started on Namenda but apparently is taking only 7 mg daily. She has not yet tried a higher dose. She continues to be full assist and has 24-hour supervision at home. She lives with one of her daughters during the day and the other daughter stays at night. She has had bilateral leg swelling and has seen her primary physician for that. She has not had any agitation episodes delusions or hallucinations. There have been no falls issues as she does not walk. UPDATE 02/27/14 (LL):  Since last visit, patient has been stable.  She is accompanied today by her caregiver.  Depakote is being administered once daily at bedtime with resolution of  unresponsive episodes. She stays awake most of the day with a nap in the afternoon and sleeps most of the night. Lately she wants to sleep in the recliner rather than the bed which has contributed to lower leg edema.  She is not ambulatory but is able to pivot. She is extremely hard-of-hearing. Blood pressure has been stable, it is 132/59 in the office today. She is tolerating Plavix well with no signs of significant bleeding or bruising.  09/13/13 (LL):  Patient's daughter called requesting acute visit.  She is concerned that the patient cannot sit up. She is leaning to the right. Symptoms are not stroke like. Patient can respond. She was taken to the hospital on 09/07/13 for tia workup which was negative.  Dr. Thad Rangereynolds increased her Depakote by 125 mg in the am, thinking periods of unresponsiveness and lethary was likely partial seizures. Daughter states that her mother sometimes turns her neck and head towards the right like she is uncomfortable, but when she asks her if anything is wrong, she replies "no".  Patient is becoming more deconditioned, cannot walk but a couple steps with assistance, but can still pivot from bed to chair.  Her appetite is very poor.  08/07/13 (PS):  89 year African American lady with prior pontine infarct in 2012 with spastic residual right hemiparesis with recurrent episodes of transient right-sided weakness as well as recent episodes of sleepiness and transient worsening of symptoms with multiple visits to the hospital with negative imaging studies as well as lab  evaluation. She is seen today for followup after last visit with me on 09/13/12. 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 She apparently was doing fine until 3 weeks ago when she was found to have episode of increasing right-sided weakness in: Speech and sleepiness. She was taken to [facility] and admitted for 8 days and had negative brain imaging studies including CT scan and MRI on 07/13/13 which I  personally reviewed which did not reveal any acute stroke. CBC, UA and basic chemistry labs were also normal. Suicidal with shingles affecting the left forearm and was treated with a cycle rate. The daughter feels that she has not been the same since then. She has periods when she is quite sleepy and at times difficult to arouse and other times when she is more awake. Right-sided weakness seems more pronounced when she sleepy. She has seen her primary physician twice and has undergone lab work and CT scan which was negative. She was seen in the ER recently on 08/05/39 and with again negative lab work and CT scan and was given IV fluids and improved. She has also not been ambulating as much she requires one-person assist to walk with a walker and walks quite little. Her blood sugars have been reasonably well controlled with fasting glucoses being in the 130s and postprandials in the 140s. She had lipid profile checked 5 weeks ago which was fine hemoglobin A1c was 6.5. Patient was tried on Keppra for seizure trial but did not tolerate it due to drowsiness. The patient has also been found to have memory difficulties and has not been as interactive and cognitively has declined for the last several months. She has never been on a trial of Aricept in the past. She had vitamin B12 checked on 07/21/13 which was low-normal at 257 and TSH was normal on 01/07/2012.  EEG was obtained on 08/08/13 with presence of left frontal and temporal spikes and sharp activity indicative of high risk for partial epilepsy. She was started on Depakote ER 500 mg at hs. Update 12/18/2014 : she returns for follow-up after her last visit with me 3 months ago. She is accompanied by her daughter who provides the history. She was hospitalized in March 2016 with encephalopathy and brain MRI scan and EEG were unremarkable. She was thought to have dehydration with volume depletion and decreased by mouth intake. I have personally reviewed MRI, carotid  Doppler findings and EEG. She is now back to her baseline. She still total care and spends most of the time in the wheelchair. The daughter does walk with her once or twice a day with a walker and she has to use her bed and stand) the patient to prevent her from falling down. She has been able to tolerate Namenda XR  21 mg daily without significant side effects. She is also on Depakote 500 mg and tolerating it well. The patient has since the hospital wasn't started eating and drinking more. She is also being given Ensure supplement. Her appetite has been good. REVIEW OF SYSTEMS: Full 14 system review of systems performed and notable only for:  Runny nose, memory loss, gait difficulty, leg swelling ,drooling, diarrhea, skin woundsand all other systems negative ALLERGIES: Allergies  Allergen Reactions  . Lipitor [Atorvastatin] Other (See Comments)    Myalgias     HOME MEDICATIONS: Outpatient Prescriptions Prior to Visit  Medication Sig Dispense Refill  . clopidogrel (PLAVIX) 75 MG tablet Take 1 tablet (75 mg total) by mouth daily  with breakfast. 30 tablet 11  . ONE TOUCH ULTRA TEST test strip   1  . pantoprazole (PROTONIX) 40 MG tablet Take 40 mg by mouth every morning.     . rosuvastatin (CRESTOR) 5 MG tablet Take 5 mg by mouth every Monday, Wednesday, and Friday.    . divalproex (DEPAKOTE ER) 500 MG 24 hr tablet Take 1 tablet (500 mg total) by mouth at bedtime. 90 tablet 2  . insulin aspart (NOVOLOG) 100 UNIT/ML injection CBG 70 - 120: 0 units CBG 121 - 150: 1 unit CBG 151 - 200: 2 units CBG 201 - 250: 3 units CBG 251 - 300: 5 units CBG 301 - 350: 7 units CBG 351 - 400: 9 units 10 mL 2  . Memantine HCl ER 21 MG CP24 Take 21 mg by mouth 1 day or 1 dose. 30 capsule 3  . metFORMIN (GLUCOPHAGE) 500 MG tablet Take 500 mg by mouth 2 (two) times daily with a meal.      No facility-administered medications prior to visit.    PHYSICAL EXAM Filed Vitals:   12/18/14 1109  BP: 113/54  Pulse:  86   There is no weight on file to calculate BMI.  General: Frail elderly African American lady, seated, in no evident distress  Head: head normocephalic and atraumatic.   Neck: supple with no carotid or supraclavicular bruits  Cardiovascular: regular rate and rhythm, no murmurs, bilateral LE edema 2+ Musculoskeletal: no deformity. Mild kyphoscoliosis  Skin: no rash/petichiae . 2+ pedal edema both feet right greater than left up to the knees Vascular: Normal pulses all extremities  Neurologic Exam  Mental Status: Awake and fully alert. Disoriented to place and time. Recent and remote memory poor. Attention span, concentration and fund of knowledge diminished. Mood and affect appropriate. Mini-Mental status exam 9/30 with deficits in orientation, attention, calculation, recall and following three-step commands. Unable to name a single animal. Unable to do clock drawing a copy intersecting pentagons. Geriatric depression scale 5 only.. Pseudobulbar speech with dysarthria and raspy voice Cranial Nerves: Pupils equal, briskly reactive to light. Extraocular movements full without nystagmus. Visual fields full to confrontation. Facial sensation intact but weakness of her right lower face. Tongue, palate moves normally and symmetrically.    Motor: Spastic right hemiplegia with 2/5 right upper extremity and 3/5 right lower extremity strength with distal greater than proximal weakness. Normal strength on the left side. Increased tone on the right with spasticity  Sensory: Diminished touch and pinprick and vibratory sensation on the right.  Coordination: Unable to follow commands to test. Gait and Station: Not tested as patient is in wheelchair. Reflexes: 1+ and asymmetric brisker on right.   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 I recommend increasing Namenda XR to 28 mg daily if tolerated. I discussed side effects with the patient and daughter and asked him to call me if needed. Continue Depakote in the current dose.Patient was given refills. Return for follow-up in 3 months with Butch Penny, nurse practitioner or call earlier if necessary.  Meds ordered this encounter  Medications  . divalproex (DEPAKOTE ER) 500 MG 24 hr tablet    Sig: Take 1 tablet (500 mg total) by mouth at bedtime.    Dispense:  90 tablet    Refill:  2    Order Specific Question:  Supervising Provider    Answer:  Delia HeadySETHI, PRAMOD [[id number]]   Return in about 3 months (around 03/19/2015).  Delia HeadyPramod Sethi, MD  12/18/2014, 11:46 AM [facility] [address], [address] [place], [place] [address] [phone]  Note: This document was prepared with digital dictation and possible smart phrase technology. Any transcriptional errors that result from this process are unintentional.

## 2015-03-17 ENCOUNTER — Ambulatory Visit: Payer: Medicare Other

## 2015-03-24 ENCOUNTER — Ambulatory Visit (INDEPENDENT_AMBULATORY_CARE_PROVIDER_SITE_OTHER): Payer: Medicare Other | Admitting: Neurology

## 2015-03-24 ENCOUNTER — Encounter: Payer: Self-pay | Admitting: Neurology

## 2015-03-24 VITALS — BP 153/77 | HR 85 | Ht 64.0 in | Wt 140.0 lb

## 2015-03-24 DIAGNOSIS — F0280 Dementia in other diseases classified elsewhere without behavioral disturbance: Secondary | ICD-10-CM | POA: Diagnosis not present

## 2015-03-24 DIAGNOSIS — G309 Alzheimer's disease, unspecified: Secondary | ICD-10-CM | POA: Diagnosis not present

## 2015-03-24 DIAGNOSIS — F0150 Vascular dementia without behavioral disturbance: Secondary | ICD-10-CM

## 2015-03-24 NOTE — Patient Instructions (Signed)
I had a long discussion with the patient and daughter and aide at the bedside regarding her dementia which seems to have stabilized on Namenda. Continue Namenda XR 28 mg once daily and Depakote ER 500 mg daily for seizures which also seemed to be controlled. Continue Plavix for stroke prevention and strict control of lipids with LDL cholesterol goal below 70 mg percent. Continue present treatment plan and return for follow-up in 6 months or call earlier if necessary.

## 2015-03-24 NOTE — Progress Notes (Signed)
PATIENT: Megan [name] DOB: November 06, 1921  REASON FOR VISIT: routine follow up for hx of stroke, partial seizure, dementia HISTORY FROM: caretaker  HISTORY OF PRESENT ILLNESS: Update 08/30/2014 : She returns for follow-up after last visit 3 months ago accompanied by her daughter who provides the history. Patient seems to be doing well and has tolerated increasing the dose of Namenda to 40 mg daily quite well. She still living at home with her -2 daughters splitting their time and providing total care. She spends most of the time in the wheelchair and the daughters provide some passive range of motion. She can swallow well and has good appetite. There have been no issues about behavior or agitation. There have been no hallucinations or delusions or confusion. She had lab work done after last visit and valproic acid level was 49 on 05/24/14 and potassium was slightly high at 5.8 but with normal creatinine and possibly the sample may have been hemolyzed. Update 05/24/2014 : She returns for followup of the last visit 3 months ago. She is taken today by her daughter who provides history. Patient seems to be doing about the same neurologically. She has not had any further unresponsive episodes but she continues to be sleepy during the day as well as sleep well at night. She has been started on Namenda but apparently is taking only 7 mg daily. She has not yet tried a higher dose. She continues to be full assist and has 24-hour supervision at home. She lives with one of her daughters during the day and the other daughter stays at night. She has had bilateral leg swelling and has seen her primary physician for that. She has not had any agitation episodes delusions or hallucinations. There have been no falls issues as she does not walk. UPDATE 02/27/14 (LL):  Since last visit, patient has been stable.  She is accompanied today by her caregiver.  Depakote is being administered once daily at bedtime with resolution of  unresponsive episodes. She stays awake most of the day with a nap in the afternoon and sleeps most of the night. Lately she wants to sleep in the recliner rather than the bed which has contributed to lower leg edema.  She is not ambulatory but is able to pivot. She is extremely hard-of-hearing. Blood pressure has been stable, it is 132/59 in the office today. She is tolerating Plavix well with no signs of significant bleeding or bruising.  09/13/13 (LL):  Patient's daughter called requesting acute visit.  She is concerned that the patient cannot sit up. She is leaning to the right. Symptoms are not stroke like. Patient can respond. She was taken to the hospital on 09/07/13 for tia workup which was negative.  Dr. Thad Rangereynolds increased her Depakote by 125 mg in the am, thinking periods of unresponsiveness and lethary was likely partial seizures. Daughter states that her mother sometimes turns her neck and head towards the right like she is uncomfortable, but when she asks her if anything is wrong, she replies "no".  Patient is becoming more deconditioned, cannot walk but a couple steps with assistance, but can still pivot from bed to chair.  Her appetite is very poor.  08/07/13 (PS):  89 year African American lady with prior pontine infarct in 2012 with spastic residual right hemiparesis with recurrent episodes of transient right-sided weakness as well as recent episodes of sleepiness and transient worsening of symptoms with multiple visits to the hospital with negative imaging studies as well as lab  evaluation. She is seen today for followup after last visit with me on 09/13/12. 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 She apparently was doing fine until 3 weeks ago when she was found to have episode of increasing right-sided weakness in: Speech and sleepiness. She was taken to [facility] and admitted for 8 days and had negative brain imaging studies including CT scan and MRI on 07/13/13 which I  personally reviewed which did not reveal any acute stroke. CBC, UA and basic chemistry labs were also normal. Suicidal with shingles affecting the left forearm and was treated with a cycle rate. The daughter feels that she has not been the same since then. She has periods when she is quite sleepy and at times difficult to arouse and other times when she is more awake. Right-sided weakness seems more pronounced when she sleepy. She has seen her primary physician twice and has undergone lab work and CT scan which was negative. She was seen in the ER recently on 08/05/39 and with again negative lab work and CT scan and was given IV fluids and improved. She has also not been ambulating as much she requires one-person assist to walk with a walker and walks quite little. Her blood sugars have been reasonably well controlled with fasting glucoses being in the 130s and postprandials in the 140s. She had lipid profile checked 5 weeks ago which was fine hemoglobin A1c was 6.5. Patient was tried on Keppra for seizure trial but did not tolerate it due to drowsiness. The patient has also been found to have memory difficulties and has not been as interactive and cognitively has declined for the last several months. She has never been on a trial of Aricept in the past. She had vitamin B12 checked on 07/21/13 which was low-normal at 257 and TSH was normal on 01/07/2012.  EEG was obtained on 08/08/13 with presence of left frontal and temporal spikes and sharp activity indicative of high risk for partial epilepsy. She was started on Depakote ER 500 mg at hs. Update 12/18/2014 : she returns for follow-up after her last visit with me 3 months ago. She is accompanied by her daughter who provides the history. She was hospitalized in March 2016 with encephalopathy and brain MRI scan and EEG were unremarkable. She was thought to have dehydration with volume depletion and decreased by mouth intake. I have personally reviewed MRI, carotid  Doppler findings and EEG. She is now back to her baseline. She still total care and spends most of the time in the wheelchair. The daughter does walk with her once or twice a day with a walker and she has to use her bed and stand) the patient to prevent her from falling down. She has been able to tolerate Namenda XR  21 mg daily without significant side effects. She is also on Depakote 500 mg and tolerating it well. The patient has since the hospital wasn't started eating and drinking more. She is also being given Ensure supplement. Her appetite has been good. Update 03/24/2015 : She returns for follow-up to last visit 3 months ago accompanied by her daughter and 8. The patient has tolerated increasing Namenda XR 28 mg daily and in fact is doing slightly better. The daughter feels that she is more interactive and not as sleepy during the day. She appetite is also been good. She has not had any seizures and is tolerating Depakote ER 500 mg daily without side effects. The patient lives at home  with her daughter and needs 24-hour care. She spends most of the time in a wheelchair. The daughter and 8 to do passive range of motion exercises. There have been no other significant health changes. She continues to have significant bilateral pedal edema and is seeing her primary physician for that. REVIEW OF SYSTEMS: Full 14 system review of systems performed and notable only for:    memory loss, gait difficulty, leg swelling ,drooling,  , skin wounds and all other systems negative ALLERGIES: Allergies  Allergen Reactions  . Lipitor [Atorvastatin] Other (See Comments)    Myalgias     HOME MEDICATIONS: Outpatient Prescriptions Prior to Visit  Medication Sig Dispense Refill  . clopidogrel (PLAVIX) 75 MG tablet Take 1 tablet (75 mg total) by mouth daily with breakfast. 30 tablet 11  . divalproex (DEPAKOTE ER) 500 MG 24 hr tablet Take 1 tablet (500 mg total) by mouth at bedtime. 90 tablet 2  . memantine (NAMENDA XR)  28 MG CP24 24 hr capsule Take 1 capsule (28 mg total) by mouth daily. 30 capsule 3  . ONE TOUCH ULTRA TEST test strip   1  . pantoprazole (PROTONIX) 40 MG tablet Take 40 mg by mouth every morning.     . rosuvastatin (CRESTOR) 5 MG tablet Take 5 mg by mouth every Monday, Wednesday, and Friday.    Nathen May SOLOSTAR 300 UNIT/ML SOPN   0   No facility-administered medications prior to visit.    PHYSICAL EXAM Filed Vitals:   03/24/15 1047  BP: 153/77  Pulse: 85  Height: 5\' 4"  (1.626 m)  Weight: 140 lb (63.504 kg)   Body mass index is 24.02 kg/(m^2).  General: Frail elderly African American lady, seated, in no evident distress  Head: head normocephalic and atraumatic.   Neck: supple with no carotid or supraclavicular bruits  Cardiovascular: regular rate and rhythm, no murmurs, bilateral LE edema 3+ with blisters on the skin right shin greater than left Musculoskeletal: no deformity. Mild kyphoscoliosis  Skin: no rash/petichiae . 2+ pedal edema both feet right greater than left up to the knees Vascular: Normal pulses all extremities  Neurologic Exam  Mental Status: Awake and fully alert. Disoriented to place and time. Recent and remote memory poor. Attention span, concentration and fund of knowledge diminished. Mood and affect appropriate. Mini-Mental status exam not done today but 9/30 last visit.. Pseudobulbar speech with dysarthria and raspy voice Cranial Nerves: Pupils equal, briskly reactive to light. Extraocular movements full without nystagmus. Visual fields full to confrontation. Facial sensation intact but weakness of her right lower face. Tongue, palate moves normally and symmetrically.    Motor: Spastic right hemiplegia with 2/5 right upper extremity and 3/5 right lower extremity strength with distal greater than proximal weakness. Normal strength on the left side. Increased tone on the right with spasticity  Sensory: Diminished touch and pinprick and vibratory sensation on the  right.  Coordination: Unable to follow commands to test. Gait and Station: Not tested as patient is in wheelchair. Reflexes: 1+ and asymmetric brisker on right.   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 PLAN:  I had a long discussion with the patient and daughter and aide at the bedside regarding her dementia which seems to have stabilized on Namenda. Continue Namenda XR 28  mg once daily and Depakote ER 500 mg daily for seizures which also seemed to be controlled. Continue Plavix for stroke prevention and strict control of lipids with LDL cholesterol goal below 70 mg percent. Continue present treatment plan and return for follow-up in 6 months or call earlier if necessary.                               Return in about 6 months (around 09/24/2015).  Delia Heady, MD  03/24/2015, 11:51 AM [facility] [address] [place], [place] [address] [phone]  Note: This document was prepared with digital dictation and possible smart phrase technology. Any transcriptional errors that result from this process are unintentional.

## 2015-09-24 ENCOUNTER — Encounter: Payer: Self-pay | Admitting: Neurology

## 2015-09-24 ENCOUNTER — Ambulatory Visit (INDEPENDENT_AMBULATORY_CARE_PROVIDER_SITE_OTHER): Payer: Medicare Other | Admitting: Neurology

## 2015-09-24 VITALS — BP 129/62 | HR 91 | Ht 64.0 in

## 2015-09-24 DIAGNOSIS — F0390 Unspecified dementia without behavioral disturbance: Secondary | ICD-10-CM | POA: Diagnosis not present

## 2015-09-24 NOTE — Progress Notes (Signed)
PATIENT: Megan [name] DOB: November 06, 1921  REASON FOR VISIT: routine follow up for hx of stroke, partial seizure, dementia HISTORY FROM: caretaker  HISTORY OF PRESENT ILLNESS: Update 08/30/2014 : She returns for follow-up after last visit 3 months ago accompanied by her daughter who provides the history. Patient seems to be doing well and has tolerated increasing the dose of Namenda to 40 mg daily quite well. She still living at home with her -2 daughters splitting their time and providing total care. She spends most of the time in the wheelchair and the daughters provide some passive range of motion. She can swallow well and has good appetite. There have been no issues about behavior or agitation. There have been no hallucinations or delusions or confusion. She had lab work done after last visit and valproic acid level was 49 on 05/24/14 and potassium was slightly high at 5.8 but with normal creatinine and possibly the sample may have been hemolyzed. Update 05/24/2014 : She returns for followup of the last visit 3 months ago. She is taken today by her daughter who provides history. Patient seems to be doing about the same neurologically. She has not had any further unresponsive episodes but she continues to be sleepy during the day as well as sleep well at night. She has been started on Namenda but apparently is taking only 7 mg daily. She has not yet tried a higher dose. She continues to be full assist and has 24-hour supervision at home. She lives with one of her daughters during the day and the other daughter stays at night. She has had bilateral leg swelling and has seen her primary physician for that. She has not had any agitation episodes delusions or hallucinations. There have been no falls issues as she does not walk. UPDATE 02/27/14 (LL):  Since last visit, patient has been stable.  She is accompanied today by her caregiver.  Depakote is being administered once daily at bedtime with resolution of  unresponsive episodes. She stays awake most of the day with a nap in the afternoon and sleeps most of the night. Lately she wants to sleep in the recliner rather than the bed which has contributed to lower leg edema.  She is not ambulatory but is able to pivot. She is extremely hard-of-hearing. Blood pressure has been stable, it is 132/59 in the office today. She is tolerating Plavix well with no signs of significant bleeding or bruising.  09/13/13 (LL):  Patient's daughter called requesting acute visit.  She is concerned that the patient cannot sit up. She is leaning to the right. Symptoms are not stroke like. Patient can respond. She was taken to the hospital on 09/07/13 for tia workup which was negative.  Dr. Thad Rangereynolds increased her Depakote by 125 mg in the am, thinking periods of unresponsiveness and lethary was likely partial seizures. Daughter states that her mother sometimes turns her neck and head towards the right like she is uncomfortable, but when she asks her if anything is wrong, she replies "no".  Patient is becoming more deconditioned, cannot walk but a couple steps with assistance, but can still pivot from bed to chair.  Her appetite is very poor.  08/07/13 (PS):  89 year African American lady with prior pontine infarct in 2012 with spastic residual right hemiparesis with recurrent episodes of transient right-sided weakness as well as recent episodes of sleepiness and transient worsening of symptoms with multiple visits to the hospital with negative imaging studies as well as lab  evaluation. She is seen today for followup after last visit with me on 09/13/12. 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 She apparently was doing fine until 3 weeks ago when she was found to have episode of increasing right-sided weakness in: Speech and sleepiness. She was taken to [facility] and admitted for 8 days and had negative brain imaging studies including CT scan and MRI on 07/13/13 which I  personally reviewed which did not reveal any acute stroke. CBC, UA and basic chemistry labs were also normal. Suicidal with shingles affecting the left forearm and was treated with a cycle rate. The daughter feels that she has not been the same since then. She has periods when she is quite sleepy and at times difficult to arouse and other times when she is more awake. Right-sided weakness seems more pronounced when she sleepy. She has seen her primary physician twice and has undergone lab work and CT scan which was negative. She was seen in the ER recently on 08/05/39 and with again negative lab work and CT scan and was given IV fluids and improved. She has also not been ambulating as much she requires one-person assist to walk with a walker and walks quite little. Her blood sugars have been reasonably well controlled with fasting glucoses being in the 130s and postprandials in the 140s. She had lipid profile checked 5 weeks ago which was fine hemoglobin A1c was 6.5. Patient was tried on Keppra for seizure trial but did not tolerate it due to drowsiness. The patient has also been found to have memory difficulties and has not been as interactive and cognitively has declined for the last several months. She has never been on a trial of Aricept in the past. She had vitamin B12 checked on 07/21/13 which was low-normal at 257 and TSH was normal on 01/07/2012.  EEG was obtained on 08/08/13 with presence of left frontal and temporal spikes and sharp activity indicative of high risk for partial epilepsy. She was started on Depakote ER 500 mg at hs. Update 12/18/2014 : she returns for follow-up after her last visit with me 3 months ago. She is accompanied by her daughter who provides the history. She was hospitalized in March 2016 with encephalopathy and brain MRI scan and EEG were unremarkable. She was thought to have dehydration with volume depletion and decreased by mouth intake. I have personally reviewed MRI, carotid  Doppler findings and EEG. She is now back to her baseline. She still total care and spends most of the time in the wheelchair. The daughter does walk with her once or twice a day with a walker and she has to use her bed and stand) the patient to prevent her from falling down. She has been able to tolerate Namenda XR  21 mg daily without significant side effects. She is also on Depakote 500 mg and tolerating it well. The patient has since the hospital wasn't started eating and drinking more. She is also being given Ensure supplement. Her appetite has been good. Update 03/24/2015 : She returns for follow-up to last visit 3 months ago accompanied by her daughter and 8. The patient has tolerated increasing Namenda XR 28 mg daily and in fact is doing slightly better. The daughter feels that she is more interactive and not as sleepy during the day. She appetite is also been good. She has not had any seizures and is tolerating Depakote ER 500 mg daily without side effects. The patient lives at home  with her daughter and needs 24-hour care. She spends most of the time in a wheelchair. The daughter and 8 to do passive range of motion exercises. There have been no other significant health changes. She continues to have significant bilateral pedal edema and is seeing her primary physician for that. Update 09/24/15 : She returns for follow-up after last visit with me 5 months ago. She is a complaint by her daughter. Patient has done neurologically well without recurrent seizures strokes or TIAs. She was actually admitted in March 2016 for altered mental status which was felt to be related to dehydration. MRI scan of the brain at that visit showed no acute abnormality and EEG showed no seizure activity. Patient continues to   days when she is more sleepy than usual for couple of days but then she is okay after that. She spends most of the time in a wheelchair and in the bed. She is not able to walk. She has significant bilateral  leg swelling. She has home  nurse coming to help her deal with her leg swelling and skin blisters. She has 24-hour care at home. She uses a bedside commode. She usually eats quite well. There have been no issues with agitation. She is tolerating Plavix well without bleeding or bruising. Her blood pressure is well controlled and today it is 129562. She saw her primary physician Dr. Valentina Lucks yesterday.Marland Kitchen REVIEW OF SYSTEMS: Full 14 system review of systems performed and notable only for:    memory loss, gait difficulty, leg swelling ,drooling,  , skin wounds and all other systems negative ALLERGIES: Allergies  Allergen Reactions  . Lipitor [Atorvastatin] Other (See Comments)    Myalgias     HOME MEDICATIONS: Outpatient Prescriptions Prior to Visit  Medication Sig Dispense Refill  . clopidogrel (PLAVIX) 75 MG tablet Take 1 tablet (75 mg total) by mouth daily with breakfast. 30 tablet 11  . divalproex (DEPAKOTE ER) 500 MG 24 hr tablet Take 1 tablet (500 mg total) by mouth at bedtime. 90 tablet 2  . memantine (NAMENDA XR) 28 MG CP24 24 hr capsule Take 1 capsule (28 mg total) by mouth daily. 30 capsule 3  . ONE TOUCH ULTRA TEST test strip   1  . pantoprazole (PROTONIX) 40 MG tablet Take 40 mg by mouth every morning.     . rosuvastatin (CRESTOR) 5 MG tablet Take 5 mg by mouth every Monday, Wednesday, and Friday.    Nathen May SOLOSTAR 300 UNIT/ML SOPN   0   No facility-administered medications prior to visit.    PHYSICAL EXAM Filed Vitals:   09/24/15 1039  BP: 129/62  Pulse: 91  Height:  (1.626 m)   There is no weight on file to calculate BMI.  General: Frail elderly African American lady, seated, in no evident distress  Head: head normocephalic and atraumatic.   Neck: supple with no carotid or supraclavicular bruits  Cardiovascular: regular rate and rhythm, no murmurs, bilateral LE edema 3+ with blisters on the skin right shin greater than left Musculoskeletal: no deformity. Mild  kyphoscoliosis  Skin: no rash/petichiae . 2+ pedal edema both feet right greater than left up to the knees.crepe bandage both legs. Vascular: Normal pulses all extremities  Neurologic Exam  Mental Status: Awake and fully alert. Disoriented to place and time. Recent and remote memory poor. Attention span, concentration and fund of knowledge diminished. Mood and affect appropriate. Mini-Mental status exam not done today but 9/30 12/18/14.. Pseudobulbar speech with dysarthria and raspy voice  Cranial Nerves: Pupils equal, briskly reactive to light. Extraocular movements full without nystagmus. Visual fields full to confrontation. Facial sensation intact but weakness of her right lower face. Tongue, palate moves normally and symmetrically.    Motor: Spastic right hemiplegia with 2/5 right upper extremity and 3/5 right lower extremity strength with distal greater than proximal weakness. Normal strength on the left side. Increased tone on the right with spasticity  Sensory: Diminished touch and pinprick and vibratory sensation on the right.  Coordination: Unable to follow commands to test. Gait and Station: Not tested as patient is in wheelchair. Reflexes: 1+ and asymmetric brisker on right.   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 PLAN:    I had a long discussion the patient and daughter regarding her dementia, pseudobulbar state, remote stroke and answered questions. Continue Plavix for secondary stroke prevention with strict control of hypertension with blood pressure goal below 130/90 and lipids with LDL cholesterol goal below 70 mg percent.. Continue Namenda XR 28 mg daily for her dementia and Depakote ER for seizures. No routine follow-up appointment  is necessary with me. Follow-up with primary physician. She may return for follow-up only as necessary.                                Return if symptoms worsen or fail to improve.  Delia Heady, MD  09/24/2015, 11:08 AM [facility] [address] [place], [place] [address] [phone]  Note: This document was prepared with digital dictation and possible smart phrase technology. Any transcriptional errors that result from this process are unintentional.

## 2015-09-24 NOTE — Patient Instructions (Signed)
I had a long discussion the patient and daughter regarding her dementia, pseudobulbar state, remote stroke and answered questions. Continue Plavix for secondary stroke prevention with strict control of hypertension with blood pressure goal below 130/90 and lipids with LDL cholesterol goal below 70 mg percent.. Continue Namenda XR 28 mg daily for her dementia and Depakote ER for seizures. No routine follow-up appointment is necessary with me. Follow-up with primary physician. She may return for follow-up only as necessary.

## 2015-12-09 ENCOUNTER — Other Ambulatory Visit: Payer: Self-pay

## 2015-12-09 ENCOUNTER — Telehealth: Payer: Self-pay | Admitting: Neurology

## 2015-12-09 MED ORDER — MEMANTINE HCL ER 28 MG PO CP24: 28.0000 mg | ORAL_CAPSULE | Freq: Every day | ORAL | Status: DC

## 2015-12-09 NOTE — Telephone Encounter (Signed)
[place] NickelGail Manny called to request refill of memantine (NAMENDA XR) 28 MG CP24 24 hr capsule

## 2015-12-09 NOTE — Telephone Encounter (Signed)
Rn call patients daughter Megan [name] about her mother needing namenda refills. Pts daughter Megan [name] was not home. [name] Rn explain that patient does not need to follow up per Dr. Pearlean BrownieSethi. Pt needs to follow up if needed. Megan [name] stated patients PCP can start doing refills for her namenda. Megan [name] stated her mom will be seeing the PCP in May 2017 for a follow up. Pt sees the PCP every three months for follow up. Rn stated a 2 month refill can be given until her mom sees the PCP,and they can take over the refills. [name] verbalized understanding.

## 2015-12-10 NOTE — Telephone Encounter (Signed)
Patients last office notes fax to Dr.John Griffiin. Pt does not need to follow per Dr.Sethi. Pt only needs to follow up as needed. No future schedule appts. PCP can do future refills for depakote and namenda. Fax receive.

## 2016-01-06 ENCOUNTER — Telehealth: Payer: Self-pay | Admitting: Neurology

## 2016-01-06 NOTE — Telephone Encounter (Signed)
Megan [name] [phone]  [place] , Halayna SETHI  [phone] * [phone]  MEDICATION QUESTIONS.C/B   Operator called pt's daughter back. She sts Depakote needed to be refilled and asked if letter could be sent to PCP so it could be filled. Operator explained that per tele note office notes were faxed to PCP and refills for Depakote could be filled by PCP. Daughter was appreciative.

## 2016-05-10 ENCOUNTER — Encounter (HOSPITAL_COMMUNITY): Payer: Self-pay | Admitting: Emergency Medicine

## 2016-05-10 ENCOUNTER — Emergency Department (HOSPITAL_COMMUNITY)
Admission: EM | Admit: 2016-05-10 | Discharge: 2016-05-10 | Disposition: A | Discharge status: Home / Self Care | Payer: Medicare Other | Attending: Emergency Medicine | Admitting: Emergency Medicine

## 2016-05-10 ENCOUNTER — Emergency Department (HOSPITAL_COMMUNITY): Payer: Medicare Other

## 2016-05-10 DIAGNOSIS — E1122 Type 2 diabetes mellitus with diabetic chronic kidney disease: Secondary | ICD-10-CM | POA: Insufficient documentation

## 2016-05-10 DIAGNOSIS — F0390 Unspecified dementia without behavioral disturbance: Secondary | ICD-10-CM | POA: Diagnosis not present

## 2016-05-10 DIAGNOSIS — Z5181 Encounter for therapeutic drug level monitoring: Secondary | ICD-10-CM | POA: Insufficient documentation

## 2016-05-10 DIAGNOSIS — E860 Dehydration: Secondary | ICD-10-CM | POA: Insufficient documentation

## 2016-05-10 DIAGNOSIS — N183 Chronic kidney disease, stage 3 (moderate): Secondary | ICD-10-CM | POA: Diagnosis not present

## 2016-05-10 DIAGNOSIS — R531 Weakness: Secondary | ICD-10-CM | POA: Diagnosis present

## 2016-05-10 DIAGNOSIS — I129 Hypertensive chronic kidney disease with stage 1 through stage 4 chronic kidney disease, or unspecified chronic kidney disease: Secondary | ICD-10-CM | POA: Diagnosis not present

## 2016-05-10 DIAGNOSIS — Z7902 Long term (current) use of antithrombotics/antiplatelets: Secondary | ICD-10-CM | POA: Insufficient documentation

## 2016-05-10 LAB — I-STAT CHEM 8, ED
BUN: 18 mg/dL (ref 6–20)
CHLORIDE: 95 mmol/L — AB (ref 101–111)
Calcium, Ion: 1.24 mmol/L (ref 1.15–1.40)
Creatinine, Ser: 1.1 mg/dL — ABNORMAL HIGH (ref 0.44–1.00)
GLUCOSE: 139 mg/dL — AB (ref 65–99)
HCT: 30 % — ABNORMAL LOW (ref 36.0–46.0)
Hemoglobin: 10.2 g/dL — ABNORMAL LOW (ref 12.0–15.0)
POTASSIUM: 5.2 mmol/L — AB (ref 3.5–5.1)
Sodium: 132 mmol/L — ABNORMAL LOW (ref 135–145)
TCO2: 27 mmol/L (ref 0–100)

## 2016-05-10 LAB — COMPREHENSIVE METABOLIC PANEL
ALK PHOS: 66 U/L (ref 38–126)
ALT: 7 U/L — AB (ref 14–54)
AST: 14 U/L — AB (ref 15–41)
Albumin: 2.8 g/dL — ABNORMAL LOW (ref 3.5–5.0)
Anion gap: 5 (ref 5–15)
BILIRUBIN TOTAL: 0.3 mg/dL (ref 0.3–1.2)
BUN: 17 mg/dL (ref 6–20)
CALCIUM: 9.6 mg/dL (ref 8.9–10.3)
CO2: 29 mmol/L (ref 22–32)
CREATININE: 1.05 mg/dL — AB (ref 0.44–1.00)
Chloride: 96 mmol/L — ABNORMAL LOW (ref 101–111)
GFR calc Af Amer: 52 mL/min — ABNORMAL LOW (ref >60)
GFR, EST NON AFRICAN AMERICAN: 45 mL/min — AB (ref >60)
Glucose, Bld: 142 mg/dL — ABNORMAL HIGH (ref 65–99)
Potassium: 5.1 mmol/L (ref 3.5–5.1)
Sodium: 130 mmol/L — ABNORMAL LOW (ref 135–145)
TOTAL PROTEIN: 6.5 g/dL (ref 6.5–8.1)

## 2016-05-10 LAB — CBC
HEMATOCRIT: 29.9 % — AB (ref 36.0–46.0)
Hemoglobin: 9.3 g/dL — ABNORMAL LOW (ref 12.0–15.0)
MCH: 27.5 pg (ref 26.0–34.0)
MCHC: 31.1 g/dL (ref 30.0–36.0)
MCV: 88.5 fL (ref 78.0–100.0)
Platelets: 203 10*3/uL (ref 150–400)
RBC: 3.38 MIL/uL — ABNORMAL LOW (ref 3.87–5.11)
RDW: 15.6 % — ABNORMAL HIGH (ref 11.5–15.5)
WBC: 6.3 10*3/uL (ref 4.0–10.5)

## 2016-05-10 LAB — URINALYSIS, ROUTINE W REFLEX MICROSCOPIC
BILIRUBIN URINE: NEGATIVE
Glucose, UA: 100 mg/dL — AB
HGB URINE DIPSTICK: NEGATIVE
Ketones, ur: NEGATIVE mg/dL
Leukocytes, UA: NEGATIVE
Nitrite: NEGATIVE
Protein, ur: NEGATIVE mg/dL
Specific Gravity, Urine: 1.011 (ref 1.005–1.030)
pH: 8.5 — ABNORMAL HIGH (ref 5.0–8.0)

## 2016-05-10 LAB — DIFFERENTIAL
BASOS ABS: 0 10*3/uL (ref 0.0–0.1)
Basophils Relative: 0 %
Eosinophils Absolute: 0.1 10*3/uL (ref 0.0–0.7)
Eosinophils Relative: 1 %
LYMPHS PCT: 44 %
Lymphs Abs: 2.7 10*3/uL (ref 0.7–4.0)
MONOS PCT: 10 %
Monocytes Absolute: 0.6 10*3/uL (ref 0.1–1.0)
NEUTROS ABS: 2.8 10*3/uL (ref 1.7–7.7)
Neutrophils Relative %: 45 %

## 2016-05-10 LAB — RAPID URINE DRUG SCREEN, HOSP PERFORMED
Amphetamines: NOT DETECTED
BARBITURATES: NOT DETECTED
Benzodiazepines: NOT DETECTED
Cocaine: NOT DETECTED
Opiates: NOT DETECTED
Tetrahydrocannabinol: NOT DETECTED

## 2016-05-10 LAB — I-STAT TROPONIN, ED: TROPONIN I, POC: 0.01 ng/mL (ref 0.00–0.08)

## 2016-05-10 LAB — APTT: APTT: 31 s (ref 24–36)

## 2016-05-10 LAB — PROTIME-INR
INR: 1.11
Prothrombin Time: 14.4 seconds (ref 11.4–15.2)

## 2016-05-10 LAB — ETHANOL: Alcohol, Ethyl (B): <5 mg/dL (ref <5)

## 2016-05-10 MED ORDER — SODIUM CHLORIDE 0.9 % IV BOLUS (SEPSIS)
500.0000 mL | Freq: Once | INTRAVENOUS | Status: AC
Start: 2016-05-10 — End: 2016-05-10
  Administered 2016-05-10: 500 mL via INTRAVENOUS

## 2016-05-10 NOTE — ED Provider Notes (Signed)
[facility]-EMERGENCY DEPT Provider Note   CSN: [id number] Arrival date & time: 05/10/16  0010     History   Chief Complaint Chief Complaint  Patient presents with  . Weakness    HPI Megan [name] is a 82 y.o. female.  Patient brought from home by ambulance. Patient's daughters, who are her caregivers, report that she has been very weak today. She has been more confused than usual. Daughters report that she has been having trouble eating and drinking, or concerned she might be dehydrated. When she was eating earlier today, daughter noticed that she was holding food in her mouth and drooling some, not sure if she was swallowing it well. She does have a history of stroke and seizure in the past. No seizure noted today.      Past Medical History:  Diagnosis Date  . Diabetes mellitus   . Hyperlipidemia   . Seizure (HCC)   . Stroke [facility]) 2012   possible stroke Jan 2016    Patient Active Problem List   Diagnosis Date Noted  . Stroke (HCC)   . Acute renal failure superimposed on stage 3 chronic kidney disease (HCC) 10/23/2014  . Dehydration 10/23/2014  . Acute hyperkalemia 10/23/2014  . Abnormal urinalysis 10/23/2014  . CVA (cerebral infarction) 10/23/2014  . Chest pain 04/24/2014  . Acute encephalopathy 08/09/2013  . Altered mental status 08/08/2013  . Dementia with Lewy bodies 08/07/2013  . Vascular dementia 08/07/2013  . Sleepiness 08/07/2013  . Anemia 07/21/2013  . Protein-calorie malnutrition, severe (HCC) 07/15/2013  . Herpes zoster 07/13/2013  . Spastic hemiplegia affecting dominant side (HCC) 06/25/2013  . History of CVA w/residual right side deficits 09/03/2012  . History of partial seizures   . TIA (transient ischemic attack) 01/06/2012  . Community acquired pneumonia 01/06/2012  . Diabetes mellitus type II, uncontrolled (HCC) 01/06/2012  . HTN (hypertension) 01/06/2012  . H/O 01/06/2012    Past Surgical History:  Procedure Laterality Date  . CATARACT EXTRACTION       OB History    No data available       Home Medications    Prior to Admission medications   Medication Sig Start Date End Date Taking? Authorizing Provider  clopidogrel (PLAVIX) 75 MG tablet Take 1 tablet (75 mg total) by mouth daily with breakfast. 08/15/13  Yes [name], MD  divalproex (DEPAKOTE ER) 500 MG 24 hr tablet Take 1 tablet (500 mg total) by mouth at bedtime. 12/18/14  Yes Micki Riley, MD  guaiFENesin (MUCINEX) 600 MG 12 hr tablet Take 600 mg by mouth 2 (two) times daily as needed for to loosen phlegm.   Yes Historical Provider, MD  memantine (NAMENDA XR) 28 MG CP24 24 hr capsule Take 1 capsule (28 mg total) by mouth daily. 12/09/15  Yes Micki Riley, MD  pantoprazole (PROTONIX) 40 MG tablet Take 40 mg by mouth every morning.    Yes Historical Provider, MD  rosuvastatin (CRESTOR) 5 MG tablet Take 5 mg by mouth every Monday, Wednesday, and Friday.   Yes Historical Provider, MD  TOUJEO SOLOSTAR 300 UNIT/ML SOPN Inject 7 Units as directed every evening.  12/08/14  Yes Historical Provider, MD  zinc oxide (BALMEX) 11.3 % CREA cream Apply 1 application topically daily.   Yes Historical Provider, MD    Family History Family History  Problem Relation Age of Onset  . CAD Mother   . Hypertension Mother   . Diabetes Mother   . Stroke Mother     Social  History Social History  Substance Use Topics  . Smoking status: Never Smoker  . Smokeless tobacco: Former Neurosurgeon    Types: Snuff     Comment: quit 50-60 years ago  . Alcohol use No     Allergies   Lipitor [atorvastatin]   Review of Systems Review of Systems  Constitutional: Positive for fatigue.  Neurological: Negative for seizures.  Psychiatric/Behavioral: Positive for confusion.  All other systems reviewed and are negative.    Physical Exam Updated Vital Signs BP (!) 148/53 (BP Location: Right Arm)   Pulse 73   Temp 97.5 F (36.4 C) (Oral)   Resp 10   Ht 5\' 2"  (1.575 m)   Wt 140 lb (63.5 kg)    SpO2 99%   BMI 25.61 kg/m   Physical Exam  Constitutional: She appears well-developed and well-nourished. No distress.  HENT:  Head: Normocephalic and atraumatic.  Right Ear: Hearing normal.  Left Ear: Hearing normal.  Nose: Nose normal.  Mouth/Throat: Oropharynx is clear and moist and mucous membranes are normal.  Eyes: Conjunctivae and EOM are normal. Pupils are equal, round, and reactive to light.  Neck: Normal range of motion. Neck supple.  Cardiovascular: Regular rhythm, S1 normal and S2 normal.  Exam reveals no gallop and no friction rub.   No murmur heard. Pulmonary/Chest: Effort normal and breath sounds normal. No respiratory distress. She exhibits no tenderness.  Abdominal: Soft. Normal appearance and bowel sounds are normal. There is no hepatosplenomegaly. There is no tenderness. There is no rebound, no guarding, no tenderness at McBurney's point and negative Murphy's sign. No hernia.  Musculoskeletal: Normal range of motion.  Neurological: She is alert. She has normal strength. No sensory deficit. GCS eye subscore is 4. GCS verbal subscore is 5. GCS motor subscore is 6.  Patient is alert, does follow commands. She does have evidence of right hemiparesis, no obvious left-sided weakness noted.  Skin: Skin is warm, dry and intact. No rash noted. No cyanosis.  Psychiatric: She has a normal mood and affect. Her speech is normal and behavior is normal. Thought content normal.  Nursing note and vitals reviewed.    ED Treatments / Results  Labs (all labs ordered are listed, but only abnormal results are displayed) Labs Reviewed  CBC - Abnormal; Notable for the following:       Result Value   RBC 3.38 (*)    Hemoglobin 9.3 (*)    HCT 29.9 (*)    RDW 15.6 (*)    All other components within normal limits  COMPREHENSIVE METABOLIC PANEL - Abnormal; Notable for the following:    Sodium 130 (*)    Chloride 96 (*)    Glucose, Bld 142 (*)    Creatinine, Ser 1.05 (*)    Albumin  2.8 (*)    AST 14 (*)    ALT 7 (*)    GFR calc non Af Amer 45 (*)    GFR calc Af Amer 52 (*)    All other components within normal limits  URINALYSIS, ROUTINE W REFLEX MICROSCOPIC (NOT AT [facility]) - Abnormal; Notable for the following:    pH 8.5 (*)    Glucose, UA 100 (*)    All other components within normal limits  I-STAT CHEM 8, ED - Abnormal; Notable for the following:    Sodium 132 (*)    Potassium 5.2 (*)    Chloride 95 (*)    Creatinine, Ser 1.10 (*)    Glucose, Bld 139 (*)  Hemoglobin 10.2 (*)    HCT 30.0 (*)    All other components within normal limits  ETHANOL  PROTIME-INR  APTT  DIFFERENTIAL  URINE RAPID DRUG SCREEN, HOSP PERFORMED  I-STAT TROPOININ, ED    EKG  EKG Interpretation  Date/Time:  Monday May 10 2016 00:35:00 EDT Ventricular Rate:  72 PR Interval:    QRS Duration: 77 QT Interval:  366 QTC Calculation: 401 R Axis:   -7 Text Interpretation:  Sinus rhythm Prolonged PR interval Low voltage, precordial leads No significant change since last tracing Confirmed by Samarah Hogle  MD, Juaquin Ludington [phone]) on 05/10/2016 3:03:30 AM       Radiology Ct Head Wo Contrast  Result Date: 05/10/2016 CLINICAL DATA:  Acute onset of generalized weakness. Left-sided facial droop. Initial encounter. EXAM: CT HEAD WITHOUT CONTRAST TECHNIQUE: Contiguous axial images were obtained from the base of the skull through the vertex without intravenous contrast. COMPARISON:  MRI of the brain and CT of the head performed 10/23/2014 FINDINGS: Brain: No evidence of acute infarction, hemorrhage, hydrocephalus, extra-axial collection or mass lesion/mass effect. Prominence of the ventricles and sulci reflects mild to moderate cortical volume loss. Cerebellar atrophy is noted. Scattered periventricular and subcortical white matter change likely reflects small vessel ischemic microangiopathy. Chronic lacunar infarcts are noted at the basal ganglia bilaterally. The brainstem and fourth ventricle  are within normal limits. The basal ganglia are unremarkable in appearance. The cerebral hemispheres demonstrate grossly normal gray-white differentiation. No mass effect or midline shift is seen. Vascular: No hyperdense vessel or unexpected calcification. Skull: There is no evidence of fracture; visualized osseous structures are unremarkable in appearance. Sinuses/Orbits: The visualized portions of the orbits are within normal limits. The paranasal sinuses and mastoid air cells are well-aerated. Other: No significant soft tissue abnormalities are seen. IMPRESSION: 1. No acute intracranial pathology seen on CT. 2. Mild to moderate cortical volume loss and scattered small vessel ischemic microangiopathy. 3. Chronic lacunar infarcts at the basal ganglia bilaterally. Electronically Signed   By: Roanna Raider M.D.   On: 05/10/2016 02:00    Procedures Procedures (including critical care time)  Medications Ordered in ED Medications  sodium chloride 0.9 % bolus 500 mL (0 mLs Intravenous Stopped 05/10/16 0430)     Initial Impression / Assessment and Plan / ED Course  I have reviewed the triage vital signs and the nursing notes.  Pertinent labs & imaging results that were available during my care of the patient were reviewed by me and considered in my medical decision making (see chart for details).  Clinical Course   Patient presented to the emergency department for generalized weakness. Patient caregiver daughter reports that she does have intermittent episodes of being very weak and at times gets better on her own. She seemed more weak and confused today, however, than usual. She was having trouble sitting up, eating and drinking. There was no focal neurologic findings. Examination revealed persistent right hemiparesis, consistent with baseline. No left-sided weakness and no cranial nerve abnormality. Her lab work was normal. No sign of urinary tract infection. Head CT unremarkable. Patient hydrated and  is much more alert. She is able to stand and pivot like she does normally at home, family feels that she is at her normal baseline. There is therefore no concern for CVA and does not require any further workup at this time.  Final Clinical Impressions(s) / ED Diagnoses   Final diagnoses:  Weakness  Dehydration    New Prescriptions New Prescriptions   No medications  on file     Gilda Creasehristopher J Mirai Greenwood, MD 05/10/16 [phone]

## 2016-05-10 NOTE — ED Notes (Signed)
Taken to CT at this time. 

## 2016-05-10 NOTE — ED Triage Notes (Signed)
Brought by EMS from home with c/o weakness.  Hx of stroke.  Per family last known well was 9/16 at 1100.  Right side deficits noted from previous stroke and speech issues are patients norm per family.  Left sided facial droop noted.

## 2016-05-10 NOTE — ED Notes (Signed)
Family at bedside. 

## 2016-06-30 ENCOUNTER — Ambulatory Visit (INDEPENDENT_AMBULATORY_CARE_PROVIDER_SITE_OTHER): Payer: Medicare Other | Admitting: Neurology

## 2016-06-30 ENCOUNTER — Encounter: Payer: Self-pay | Admitting: Neurology

## 2016-06-30 VITALS — BP 131/59 | HR 84

## 2016-06-30 DIAGNOSIS — F0390 Unspecified dementia without behavioral disturbance: Secondary | ICD-10-CM

## 2016-06-30 DIAGNOSIS — R6889 Other general symptoms and signs: Secondary | ICD-10-CM | POA: Diagnosis not present

## 2016-06-30 DIAGNOSIS — G40219 Localization-related (focal) (partial) symptomatic epilepsy and epileptic syndromes with complex partial seizures, intractable, without status epilepticus: Secondary | ICD-10-CM

## 2016-06-30 NOTE — Progress Notes (Signed)
PATIENT: Megan [name] DOB: November 06, 1921  REASON FOR VISIT: routine follow up for hx of stroke, partial seizure, dementia HISTORY FROM: caretaker  HISTORY OF PRESENT ILLNESS: Update 08/30/2014 : She returns for follow-up after last visit 3 months ago accompanied by her daughter who provides the history. Patient seems to be doing well and has tolerated increasing the dose of Namenda to 40 mg daily quite well. She still living at home with her -2 daughters splitting their time and providing total care. She spends most of the time in the wheelchair and the daughters provide some passive range of motion. She can swallow well and has good appetite. There have been no issues about behavior or agitation. There have been no hallucinations or delusions or confusion. She had lab work done after last visit and valproic acid level was 49 on 05/24/14 and potassium was slightly high at 5.8 but with normal creatinine and possibly the sample may have been hemolyzed. Update 05/24/2014 : She returns for followup of the last visit 3 months ago. She is taken today by her daughter who provides history. Patient seems to be doing about the same neurologically. She has not had any further unresponsive episodes but she continues to be sleepy during the day as well as sleep well at night. She has been started on Namenda but apparently is taking only 7 mg daily. She has not yet tried a higher dose. She continues to be full assist and has 24-hour supervision at home. She lives with one of her daughters during the day and the other daughter stays at night. She has had bilateral leg swelling and has seen her primary physician for that. She has not had any agitation episodes delusions or hallucinations. There have been no falls issues as she does not walk. UPDATE 02/27/14 (LL):  Since last visit, patient has been stable.  She is accompanied today by her caregiver.  Depakote is being administered once daily at bedtime with resolution of  unresponsive episodes. She stays awake most of the day with a nap in the afternoon and sleeps most of the night. Lately she wants to sleep in the recliner rather than the bed which has contributed to lower leg edema.  She is not ambulatory but is able to pivot. She is extremely hard-of-hearing. Blood pressure has been stable, it is 132/59 in the office today. She is tolerating Plavix well with no signs of significant bleeding or bruising.  09/13/13 (LL):  Patient's daughter called requesting acute visit.  She is concerned that the patient cannot sit up. She is leaning to the right. Symptoms are not stroke like. Patient can respond. She was taken to the hospital on 09/07/13 for tia workup which was negative.  Dr. Thad Rangereynolds increased her Depakote by 125 mg in the am, thinking periods of unresponsiveness and lethary was likely partial seizures. Daughter states that her mother sometimes turns her neck and head towards the right like she is uncomfortable, but when she asks her if anything is wrong, she replies "no".  Patient is becoming more deconditioned, cannot walk but a couple steps with assistance, but can still pivot from bed to chair.  Her appetite is very poor.  08/07/13 (PS):  89 year African American lady with prior pontine infarct in 2012 with spastic residual right hemiparesis with recurrent episodes of transient right-sided weakness as well as recent episodes of sleepiness and transient worsening of symptoms with multiple visits to the hospital with negative imaging studies as well as lab  evaluation. She is seen today for followup after last visit with me on 09/13/12. 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 She apparently was doing fine until 3 weeks ago when she was found to have episode of increasing right-sided weakness in: Speech and sleepiness. She was taken to [facility] and admitted for 8 days and had negative brain imaging studies including CT scan and MRI on 07/13/13 which I  personally reviewed which did not reveal any acute stroke. CBC, UA and basic chemistry labs were also normal. Suicidal with shingles affecting the left forearm and was treated with a cycle rate. The daughter feels that she has not been the same since then. She has periods when she is quite sleepy and at times difficult to arouse and other times when she is more awake. Right-sided weakness seems more pronounced when she sleepy. She has seen her primary physician twice and has undergone lab work and CT scan which was negative. She was seen in the ER recently on 08/05/39 and with again negative lab work and CT scan and was given IV fluids and improved. She has also not been ambulating as much she requires one-person assist to walk with a walker and walks quite little. Her blood sugars have been reasonably well controlled with fasting glucoses being in the 130s and postprandials in the 140s. She had lipid profile checked 5 weeks ago which was fine hemoglobin A1c was 6.5. Patient was tried on Keppra for seizure trial but did not tolerate it due to drowsiness. The patient has also been found to have memory difficulties and has not been as interactive and cognitively has declined for the last several months. She has never been on a trial of Aricept in the past. She had vitamin B12 checked on 07/21/13 which was low-normal at 257 and TSH was normal on 01/07/2012.  EEG was obtained on 08/08/13 with presence of left frontal and temporal spikes and sharp activity indicative of high risk for partial epilepsy. She was started on Depakote ER 500 mg at hs. Update 12/18/2014 : she returns for follow-up after her last visit with me 3 months ago. She is accompanied by her daughter who provides the history. She was hospitalized in March 2016 with encephalopathy and brain MRI scan and EEG were unremarkable. She was thought to have dehydration with volume depletion and decreased by mouth intake. I have personally reviewed MRI, carotid  Doppler findings and EEG. She is now back to her baseline. She still total care and spends most of the time in the wheelchair. The daughter does walk with her once or twice a day with a walker and she has to use her bed and stand) the patient to prevent her from falling down. She has been able to tolerate Namenda XR  21 mg daily without significant side effects. She is also on Depakote 500 mg and tolerating it well. The patient has since the hospital wasn't started eating and drinking more. She is also being given Ensure supplement. Her appetite has been good. Update 03/24/2015 : She returns for follow-up to last visit 3 months ago accompanied by her daughter and 8. The patient has tolerated increasing Namenda XR 28 mg daily and in fact is doing slightly better. The daughter feels that she is more interactive and not as sleepy during the day. She appetite is also been good. She has not had any seizures and is tolerating Depakote ER 500 mg daily without side effects. The patient lives at home  with her daughter and needs 24-hour care. She spends most of the time in a wheelchair. The daughter and 8 to do passive range of motion exercises. There have been no other significant health changes. She continues to have significant bilateral pedal edema and is seeing her primary physician for that. Update 09/24/15 : She returns for follow-up after last visit with me 5 months ago. She is a complaint by her daughter. Patient has done neurologically well without recurrent seizures strokes or TIAs. She was actually admitted in March 2016 for altered mental status which was felt to be related to dehydration. MRI scan of the brain at that visit showed no acute abnormality and EEG showed no seizure activity. Patient continues to   days when she is more sleepy than usual for couple of days but then she is okay after that. She spends most of the time in a wheelchair and in the bed. She is not able to walk. She has significant bilateral  leg swelling. She has home  nurse coming to help her deal with her leg swelling and skin blisters. She has 24-hour care at home. She uses a bedside commode. She usually eats quite well. There have been no issues with agitation. She is tolerating Plavix well without bleeding or bruising. Her blood pressure is well controlled and today it is [id number]. She saw her primary physician Dr. Valentina LucksGriffin yesterday.Marland Kitchen. Update 06/30/2016 :  She returns for follow-up after last visit 9 months ago. She is accompanied by her daughter who provides most of the history. Patient has had several episodes in which she becomes weak all over and was unresponsive and at times not eating. She is being taken to the emergency room twice since the last seen primary physician Dr. Valentina LucksGriffin but no obvious cause has been found. [facility] on 05/10/16 where she was felt to be dehydrated and given IV fluids and improved. CT scan of the head showed no acute abnormality. Patient was on one occasion previously found to have UTI which was treated. There was no definite thickness tonic-clonic seizure activity. Patient daughter is is frustrated with the lack of definitive diagnosis for these events and requested an appointment to see me to see if he had any new suggestions. Patient remains in a wheelchair she is not able to walk but she can stand and pivot and help him transfer to the commode on the bed. She communicates in a very raspy and dysarthric voice but has diminished verbal output. She is usually able to tell her daughters when she wants to go to the bathroom but has to wear a diaper going out of the house. The patient is still complete care and daughter provides that there are no aides. The patient's other daughters provide some help. REVIEW OF SYSTEMS: Full 14 system review of systems performed and notable only for:     Fatigue, leg swelling, walking difficulty, weakness, tiredness, speech difficulty and all other  systems negative ALLERGIES: Allergies  Allergen Reactions  . Lipitor [Atorvastatin] Other (See Comments)    Myalgias     HOME MEDICATIONS: Outpatient Medications Prior to Visit  Medication Sig Dispense Refill  . clopidogrel (PLAVIX) 75 MG tablet Take 1 tablet (75 mg total) by mouth daily with breakfast. 30 tablet 11  . divalproex (DEPAKOTE ER) 500 MG 24 hr tablet Take 1 tablet (500 mg total) by mouth at bedtime. 90 tablet 2  . guaiFENesin (MUCINEX) 600 MG 12 hr tablet Take  600 mg by mouth 2 (two) times daily as needed for to loosen phlegm.    . memantine (NAMENDA XR) 28 MG CP24 24 hr capsule Take 1 capsule (28 mg total) by mouth daily. 30 capsule 1  . pantoprazole (PROTONIX) 40 MG tablet Take 40 mg by mouth every morning.     . rosuvastatin (CRESTOR) 5 MG tablet Take 5 mg by mouth every Monday, Wednesday, and Friday.    Nathen May SOLOSTAR 300 UNIT/ML SOPN Inject 7 Units as directed every evening.   0  . zinc oxide (BALMEX) 11.3 % CREA cream Apply 1 application topically daily.     No facility-administered medications prior to visit.     PHYSICAL EXAM Vitals:   06/30/16 1019  BP: (!) 131/59  BP Location: Right Arm  Patient Position: Sitting  Cuff Size: Normal  Pulse: 84   There is no height or weight on file to calculate BMI.  General: Frail elderly African American lady, seated, in no evident distress  Head: head normocephalic and atraumatic.   Neck: supple with no carotid or supraclavicular bruits  Cardiovascular: regular rate and rhythm, no murmurs, bilateral LE edema 3+ with blisters on the skin right shin greater than left Musculoskeletal: no deformity. Mild kyphoscoliosis  Skin: no rash/petichiae . 2+ pedal edema both feet right greater than left up to the knees.crepe bandage both legs. Vascular: Normal pulses all extremities  Neurologic Exam  Mental Status: Awake and fully alert. Disoriented to place and time. Recent and remote memory poor. Attention span,  concentration and fund of knowledge diminished. Mood and affect appropriate. Mini-Mental status exam not done today but 9/30  On 12/18/14.. Pseudobulbar speech with dysarthria and raspy voice Cranial Nerves: Pupils equal, briskly reactive to light. Extraocular movements full without nystagmus. Visual fields full to confrontation. Facial sensation intact but weakness of her right lower face. Tongue, palate moves normally and symmetrically.    Motor: Spastic right hemiplegia with 2/5 right upper extremity and 3/5 right lower extremity strength with distal greater than proximal weakness. Normal strength on the left side. Increased tone on the right with spasticity  Sensory: Diminished touch and pinprick and vibratory sensation on the right.  Coordination: Unable to follow commands to test. Gait and Station: Not tested as patient is in wheelchair. Reflexes: 1+ and asymmetric brisker on right.   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 PLAN:    I had a long discussion with the patient's daughter regarding her spells of decreased attentiveness, fatigue and tiredness and sleepiness which seem to be recently increasing in frequency but are  of unclear etiology. I recommend checking complete metabolic panel labs, CBC, B12, TSH and valproic acid level and ammonia levels. Continue Namenda and the current dose for dementia and Depakote for seizures. Continue Plavix for stroke prevention and Crestor for lipids controlled. I counseled her to maintain adequate hydration status and nutrition. Greater than 50% time during this 25 minute visit was spent on counseling and coordination of care about her episodes of altered awareness and dementia She will return for follow-up in  the future as necessary.  Return if symptoms worsen or fail to improve.  Megan Heady, MD  06/30/2016, 5:55 PM [facility] [address], [address] [place], [place] [address] [phone]  Note: This document was prepared with digital dictation and possible smart phrase technology. Any transcriptional errors that result from this process are unintentional.

## 2016-07-02 LAB — COMPREHENSIVE METABOLIC PANEL
A/G RATIO: 0.8 — AB (ref 1.2–2.2)
ALBUMIN: 3.4 g/dL (ref 3.2–4.6)
ALK PHOS: 90 IU/L (ref 39–117)
ALT: 6 IU/L (ref 0–32)
AST: 10 IU/L (ref 0–40)
BUN / CREAT RATIO: 17 (ref 12–28)
BUN: 16 mg/dL (ref 10–36)
CHLORIDE: 98 mmol/L (ref 96–106)
CO2: 24 mmol/L (ref 18–29)
Calcium: 9.7 mg/dL (ref 8.7–10.3)
Creatinine, Ser: 0.96 mg/dL (ref 0.57–1.00)
GFR calc Af Amer: 59 mL/min/{1.73_m2} — ABNORMAL LOW (ref >59)
GFR calc non Af Amer: 52 mL/min/{1.73_m2} — ABNORMAL LOW (ref >59)
GLOBULIN, TOTAL: 4.2 g/dL (ref 1.5–4.5)
Glucose: 146 mg/dL — ABNORMAL HIGH (ref 65–99)
POTASSIUM: 5.8 mmol/L — AB (ref 3.5–5.2)
SODIUM: 139 mmol/L (ref 134–144)
Total Protein: 7.6 g/dL (ref 6.0–8.5)

## 2016-07-02 LAB — TSH: TSH: 2.16 u[IU]/mL (ref 0.450–4.500)

## 2016-07-02 LAB — CBC
Hematocrit: 32.3 % — ABNORMAL LOW (ref 34.0–46.6)
Hemoglobin: 10.4 g/dL — ABNORMAL LOW (ref 11.1–15.9)
MCH: 27.7 pg (ref 26.6–33.0)
MCHC: 32.2 g/dL (ref 31.5–35.7)
MCV: 86 fL (ref 79–97)
Platelets: 317 10*3/uL (ref 150–379)
RBC: 3.76 x10E6/uL — AB (ref 3.77–5.28)
RDW: 15.9 % — AB (ref 12.3–15.4)
WBC: 6.9 10*3/uL (ref 3.4–10.8)

## 2016-07-02 LAB — VALPROIC ACID LEVEL: Valproic Acid Lvl: 64 ug/mL (ref 50–100)

## 2016-07-02 LAB — VITAMIN B12: VITAMIN B 12: 1297 pg/mL — AB (ref 211–946)

## 2016-07-02 LAB — AMMONIA: AMMONIA: 49 ug/dL (ref 19–87)

## 2016-07-09 ENCOUNTER — Ambulatory Visit (INDEPENDENT_AMBULATORY_CARE_PROVIDER_SITE_OTHER): Payer: Medicare Other

## 2016-07-09 DIAGNOSIS — R6889 Other general symptoms and signs: Secondary | ICD-10-CM

## 2016-07-09 DIAGNOSIS — R4182 Altered mental status, unspecified: Secondary | ICD-10-CM | POA: Diagnosis not present

## 2016-07-13 ENCOUNTER — Telehealth: Payer: Self-pay

## 2016-07-13 NOTE — Telephone Encounter (Signed)
Rn tried to call Megan [name] about her moms lab work. Rn was told she was not at home and to call back later. Also was calling about EEG results.

## 2016-07-13 NOTE — Telephone Encounter (Signed)
-----   Message from Micki RileyPramod S Sethi, MD sent at 07/13/2016  8:53 AM EST ----- Kindly inform the patient then lab work from 06/30/16 shows elevated potassium level and to see her primary care physician for treatment for that. Valproic acid, ammonia,thyroid hormone levels are normal

## 2016-07-14 ENCOUNTER — Other Ambulatory Visit: Payer: Self-pay

## 2016-07-14 ENCOUNTER — Telehealth: Payer: Self-pay | Admitting: Neurology

## 2016-07-14 DIAGNOSIS — G40219 Localization-related (focal) (partial) symptomatic epilepsy and epileptic syndromes with complex partial seizures, intractable, without status epilepticus: Secondary | ICD-10-CM

## 2016-07-14 MED ORDER — DIVALPROEX SODIUM ER 500 MG PO TB24: 500.0000 mg | ORAL_TABLET | Freq: Every day | ORAL | 0 refills | Status: DC

## 2016-07-14 NOTE — Telephone Encounter (Signed)
Dondra SpryGail called to advise the divalproex (DEPAKOTE ER) 500 MG 24 hr tablet needs to be sent to [facility]. Pt has 1 tab left for tonight

## 2016-07-14 NOTE — Telephone Encounter (Signed)
Rn call patients daughter Megan [name] about needing refills. PT stated pts refills were being done by her PCP ongoing. Megan [name] thought Dr.Sethi would make an adjustment based on the test. Rn stated all test were normal except potassium was elevated. All labs were fax to PCP today. Rn stated a 30 day refill can de done until Dr. Valentina LucksGriffin returns to office on Monday. Megan [name] verbalized understanding.

## 2016-07-14 NOTE — Telephone Encounter (Signed)
Rn spoke with Megan [name] pts daughter on dpr form. Rn stated pts potassium was elevated, and needs to see PCP for treatment. Rn also stated the valporic acid and ammonia, thyroid levels were normal. Rn stated copy of the last office note, and lab work will be fax to Dr. Valentina LucksGriffin office. PTs daughter verbalized understanding.  [facility] showed the patient that EEG study done on 07/09/16 showed mild generalized slowing of brain activity but no definite seizure activity. No worrisome findings Megan [name] verbalized understanding.

## 2017-05-05 DIAGNOSIS — N390 Urinary tract infection, site not specified: Secondary | ICD-10-CM | POA: Diagnosis not present

## 2017-05-12 ENCOUNTER — Inpatient Hospital Stay (HOSPITAL_COMMUNITY)
Admission: EM | Admit: 2017-05-12 | Discharge: 2017-05-21 | DRG: 640 | Disposition: A | Discharge status: Home / Self Care | Payer: Medicare Other | Attending: Family Medicine | Admitting: Family Medicine

## 2017-05-12 ENCOUNTER — Emergency Department (HOSPITAL_COMMUNITY): Payer: Medicare Other

## 2017-05-12 ENCOUNTER — Encounter (HOSPITAL_COMMUNITY): Payer: Self-pay | Admitting: Nurse Practitioner

## 2017-05-12 DIAGNOSIS — G9341 Metabolic encephalopathy: Secondary | ICD-10-CM

## 2017-05-12 DIAGNOSIS — Z888 Allergy status to other drugs, medicaments and biological substances status: Secondary | ICD-10-CM

## 2017-05-12 DIAGNOSIS — Z9849 Cataract extraction status, unspecified eye: Secondary | ICD-10-CM

## 2017-05-12 DIAGNOSIS — Z87891 Personal history of nicotine dependence: Secondary | ICD-10-CM | POA: Diagnosis not present

## 2017-05-12 DIAGNOSIS — Z8249 Family history of ischemic heart disease and other diseases of the circulatory system: Secondary | ICD-10-CM | POA: Diagnosis not present

## 2017-05-12 DIAGNOSIS — N179 Acute kidney failure, unspecified: Secondary | ICD-10-CM | POA: Diagnosis present

## 2017-05-12 DIAGNOSIS — E871 Hypo-osmolality and hyponatremia: Secondary | ICD-10-CM | POA: Diagnosis present

## 2017-05-12 DIAGNOSIS — E860 Dehydration: Secondary | ICD-10-CM | POA: Diagnosis present

## 2017-05-12 DIAGNOSIS — E119 Type 2 diabetes mellitus without complications: Secondary | ICD-10-CM | POA: Diagnosis present

## 2017-05-12 DIAGNOSIS — R32 Unspecified urinary incontinence: Secondary | ICD-10-CM | POA: Diagnosis present

## 2017-05-12 DIAGNOSIS — I69351 Hemiplegia and hemiparesis following cerebral infarction affecting right dominant side: Secondary | ICD-10-CM

## 2017-05-12 DIAGNOSIS — E875 Hyperkalemia: Secondary | ICD-10-CM | POA: Diagnosis present

## 2017-05-12 DIAGNOSIS — Z833 Family history of diabetes mellitus: Secondary | ICD-10-CM | POA: Diagnosis not present

## 2017-05-12 DIAGNOSIS — Z23 Encounter for immunization: Secondary | ICD-10-CM

## 2017-05-12 DIAGNOSIS — R531 Weakness: Secondary | ICD-10-CM | POA: Diagnosis not present

## 2017-05-12 DIAGNOSIS — Z66 Do not resuscitate: Secondary | ICD-10-CM | POA: Diagnosis not present

## 2017-05-12 DIAGNOSIS — Z823 Family history of stroke: Secondary | ICD-10-CM

## 2017-05-12 DIAGNOSIS — G459 Transient cerebral ischemic attack, unspecified: Secondary | ICD-10-CM | POA: Diagnosis not present

## 2017-05-12 DIAGNOSIS — F0390 Unspecified dementia without behavioral disturbance: Secondary | ICD-10-CM | POA: Diagnosis present

## 2017-05-12 DIAGNOSIS — R569 Unspecified convulsions: Secondary | ICD-10-CM

## 2017-05-12 DIAGNOSIS — R4182 Altered mental status, unspecified: Secondary | ICD-10-CM

## 2017-05-12 DIAGNOSIS — Z79899 Other long term (current) drug therapy: Secondary | ICD-10-CM | POA: Diagnosis not present

## 2017-05-12 DIAGNOSIS — G9340 Encephalopathy, unspecified: Secondary | ICD-10-CM | POA: Diagnosis present

## 2017-05-12 LAB — CBC
HEMATOCRIT: 32 % — AB (ref 36.0–46.0)
HEMOGLOBIN: 10.7 g/dL — AB (ref 12.0–15.0)
MCH: 28.8 pg (ref 26.0–34.0)
MCHC: 33.4 g/dL (ref 30.0–36.0)
MCV: 86 fL (ref 78.0–100.0)
Platelets: 229 10*3/uL (ref 150–400)
RBC: 3.72 MIL/uL — ABNORMAL LOW (ref 3.87–5.11)
RDW: 14.5 % (ref 11.5–15.5)
WBC: 7.6 10*3/uL (ref 4.0–10.5)

## 2017-05-12 LAB — URINALYSIS, ROUTINE W REFLEX MICROSCOPIC
BACTERIA UA: NONE SEEN
BILIRUBIN URINE: NEGATIVE
Glucose, UA: 50 mg/dL — AB
KETONES UR: NEGATIVE mg/dL
NITRITE: NEGATIVE
Protein, ur: NEGATIVE mg/dL
SPECIFIC GRAVITY, URINE: 1.008 (ref 1.005–1.030)
pH: 7 (ref 5.0–8.0)

## 2017-05-12 LAB — BASIC METABOLIC PANEL
Anion gap: 10 (ref 5–15)
BUN: 24 mg/dL — ABNORMAL HIGH (ref 6–20)
CALCIUM: 9.2 mg/dL (ref 8.9–10.3)
CHLORIDE: 95 mmol/L — AB (ref 101–111)
CO2: 20 mmol/L — ABNORMAL LOW (ref 22–32)
CREATININE: 1 mg/dL (ref 0.44–1.00)
GFR calc non Af Amer: 47 mL/min — ABNORMAL LOW (ref >60)
GFR, EST AFRICAN AMERICAN: 55 mL/min — AB (ref >60)
Glucose, Bld: 139 mg/dL — ABNORMAL HIGH (ref 65–99)
Potassium: 5.2 mmol/L — ABNORMAL HIGH (ref 3.5–5.1)
SODIUM: 125 mmol/L — AB (ref 135–145)

## 2017-05-12 LAB — COMPREHENSIVE METABOLIC PANEL
ALBUMIN: 2.8 g/dL — AB (ref 3.5–5.0)
ALT: 6 U/L — ABNORMAL LOW (ref 14–54)
ANION GAP: 11 (ref 5–15)
AST: 19 U/L (ref 15–41)
Alkaline Phosphatase: 76 U/L (ref 38–126)
BILIRUBIN TOTAL: 0.5 mg/dL (ref 0.3–1.2)
BUN: 28 mg/dL — ABNORMAL HIGH (ref 6–20)
CHLORIDE: 90 mmol/L — AB (ref 101–111)
CO2: 20 mmol/L — ABNORMAL LOW (ref 22–32)
Calcium: 9.4 mg/dL (ref 8.9–10.3)
Creatinine, Ser: 1.12 mg/dL — ABNORMAL HIGH (ref 0.44–1.00)
GFR calc Af Amer: 48 mL/min — ABNORMAL LOW (ref >60)
GFR, EST NON AFRICAN AMERICAN: 41 mL/min — AB (ref >60)
GLUCOSE: 166 mg/dL — AB (ref 65–99)
POTASSIUM: 5.2 mmol/L — AB (ref 3.5–5.1)
Sodium: 121 mmol/L — ABNORMAL LOW (ref 135–145)
TOTAL PROTEIN: 7 g/dL (ref 6.5–8.1)

## 2017-05-12 LAB — HEMOGLOBIN A1C
HEMOGLOBIN A1C: 7.1 % — AB (ref 4.8–5.6)
MEAN PLASMA GLUCOSE: 157.07 mg/dL

## 2017-05-12 LAB — CBG MONITORING, ED: Glucose-Capillary: 168 mg/dL — ABNORMAL HIGH (ref 65–99)

## 2017-05-12 LAB — TSH: TSH: 1.654 u[IU]/mL (ref 0.350–4.500)

## 2017-05-12 LAB — I-STAT CG4 LACTIC ACID, ED: LACTIC ACID, VENOUS: 1.57 mmol/L (ref 0.5–1.9)

## 2017-05-12 LAB — GLUCOSE, CAPILLARY: Glucose-Capillary: 136 mg/dL — ABNORMAL HIGH (ref 65–99)

## 2017-05-12 MED ORDER — DIVALPROEX SODIUM ER 250 MG PO TB24
500.0000 mg | ORAL_TABLET | Freq: Every day | ORAL | Status: DC
  Administered 2017-05-12: 500 mg via ORAL
  Filled 2017-05-12: qty 2
  Filled 2017-05-12: qty 1
  Filled 2017-05-12: qty 2

## 2017-05-12 MED ORDER — FUROSEMIDE 20 MG PO TABS: 20.0000 mg | ORAL_TABLET | Freq: Every day | ORAL | Status: DC

## 2017-05-12 MED ORDER — ONDANSETRON HCL 4 MG PO TABS: 4.0000 mg | ORAL_TABLET | Freq: Four times a day (QID) | ORAL | Status: DC | PRN

## 2017-05-12 MED ORDER — ACETAMINOPHEN 325 MG PO TABS: 650.0000 mg | ORAL_TABLET | Freq: Four times a day (QID) | ORAL | Status: DC | PRN

## 2017-05-12 MED ORDER — ONDANSETRON HCL 4 MG/2ML IJ SOLN: 4.0000 mg | Freq: Four times a day (QID) | INTRAMUSCULAR | Status: DC | PRN

## 2017-05-12 MED ORDER — BISACODYL 5 MG PO TBEC
5.0000 mg | DELAYED_RELEASE_TABLET | Freq: Every day | ORAL | Status: DC | PRN
  Filled 2017-05-12: qty 1

## 2017-05-12 MED ORDER — SODIUM CHLORIDE 0.9 % IV BOLUS (SEPSIS)
1000.0000 mL | Freq: Once | INTRAVENOUS | Status: AC
  Administered 2017-05-12: 1000 mL via INTRAVENOUS

## 2017-05-12 MED ORDER — SODIUM CHLORIDE 0.9 % IV SOLN
INTRAVENOUS | Status: DC
  Administered 2017-05-12: 19:00:00 via INTRAVENOUS
  Administered 2017-05-13: 75 mL via INTRAVENOUS
  Administered 2017-05-13 – 2017-05-16 (×5): via INTRAVENOUS

## 2017-05-12 MED ORDER — INSULIN ASPART 100 UNIT/ML ~~LOC~~ SOLN
0.0000 [IU] | Freq: Three times a day (TID) | SUBCUTANEOUS | Status: DC
  Administered 2017-05-13: 3 [IU] via SUBCUTANEOUS
  Administered 2017-05-13: 2 [IU] via SUBCUTANEOUS
  Administered 2017-05-13 – 2017-05-14 (×2): 5 [IU] via SUBCUTANEOUS
  Administered 2017-05-14: 2 [IU] via SUBCUTANEOUS
  Administered 2017-05-14 – 2017-05-15 (×2): 3 [IU] via SUBCUTANEOUS
  Administered 2017-05-15: 2 [IU] via SUBCUTANEOUS
  Administered 2017-05-15 – 2017-05-16 (×2): 3 [IU] via SUBCUTANEOUS
  Administered 2017-05-16: 5 [IU] via SUBCUTANEOUS
  Administered 2017-05-16: 3 [IU] via SUBCUTANEOUS
  Administered 2017-05-17: 5 [IU] via SUBCUTANEOUS
  Administered 2017-05-17: 2 [IU] via SUBCUTANEOUS
  Administered 2017-05-18: 3 [IU] via SUBCUTANEOUS
  Administered 2017-05-18: 8 [IU] via SUBCUTANEOUS
  Administered 2017-05-18: 5 [IU] via SUBCUTANEOUS
  Administered 2017-05-19: 2 [IU] via SUBCUTANEOUS
  Administered 2017-05-19: 3 [IU] via SUBCUTANEOUS
  Administered 2017-05-19: 8 [IU] via SUBCUTANEOUS
  Administered 2017-05-20: 5 [IU] via SUBCUTANEOUS
  Administered 2017-05-20: 8 [IU] via SUBCUTANEOUS
  Administered 2017-05-20 – 2017-05-21 (×2): 5 [IU] via SUBCUTANEOUS

## 2017-05-12 MED ORDER — ENOXAPARIN SODIUM 40 MG/0.4ML ~~LOC~~ SOLN
40.0000 mg | SUBCUTANEOUS | Status: DC
  Administered 2017-05-12 – 2017-05-20 (×9): 40 mg via SUBCUTANEOUS
  Filled 2017-05-12 (×9): qty 0.4

## 2017-05-12 MED ORDER — CLOPIDOGREL BISULFATE 75 MG PO TABS
75.0000 mg | ORAL_TABLET | Freq: Every day | ORAL | Status: DC
  Administered 2017-05-13 – 2017-05-21 (×9): 75 mg via ORAL
  Filled 2017-05-12 (×9): qty 1

## 2017-05-12 MED ORDER — ACETAMINOPHEN 650 MG RE SUPP: 650.0000 mg | Freq: Four times a day (QID) | RECTAL | Status: DC | PRN

## 2017-05-12 NOTE — H&P (Signed)
History and Physical    Megan [name] ZOX:[id number] DOB: 07/29/1922  DOA: 05/12/2017 PCP: [name], MD  Patient coming from: Home  Chief Complaint: Weakness and confusion  HPI: Megan [name] is a 83 y.o. female with medical history significant of severe dementia, diabetes, seizures and history of stroke with right hemiparesis presented to the emergency department after was brought by her family with confusion and not being herself. Patient not verbal my exam history provided by daughter. She reports that patient 4 days ago started being herself not talking much and urinating herself. She also reported decreased oral intake over the past 48 hours. Per daughter patient did not have any seizures, chest pain, nausea or vomiting and any falls  ED Course: Sodium 121, UA with no abnormalities, chest x-ray no acute findings, CT head with no acute findings.  Review of Systems:  Unable to perform due to mental status changes   Past Medical History:  Diagnosis Date  . Diabetes mellitus   . Hyperlipidemia   . Seizure (HCC)   . Stroke [facility]) 2012   possible stroke Jan 2016    Past Surgical History:  Procedure Laterality Date  . CATARACT EXTRACTION       reports that she has never smoked. She has quit using smokeless tobacco. Her smokeless tobacco use included Snuff. She reports that she does not drink alcohol or use drugs.  Allergies  Allergen Reactions  . Lipitor [Atorvastatin] Other (See Comments)    Myalgias     Family History  Problem Relation Age of Onset  . CAD Mother   . Hypertension Mother   . Diabetes Mother   . Stroke Mother     Prior to Admission medications   Medication Sig Start Date End Date Taking? Authorizing Provider  clopidogrel (PLAVIX) 75 MG tablet Take 1 tablet (75 mg total) by mouth daily with breakfast. 08/15/13  Yes [name], MD  divalproex (DEPAKOTE ER) 500 MG 24 hr tablet Take 1 tablet (500 mg total) by mouth at bedtime. 07/14/16  Yes Micki Riley, MD  furosemide (LASIX) 20 MG tablet Take 20 mg by mouth daily as needed. 04/13/17  Yes [provider]  guaiFENesin (MUCINEX) 600 MG 12 hr tablet Take 600 mg by mouth 2 (two) times daily as needed for to loosen phlegm.   Yes [provider]  memantine (NAMENDA XR) 28 MG CP24 24 hr capsule Take 1 capsule (28 mg total) by mouth daily. 12/09/15  Yes Micki Riley, MD  NYSTATIN powder Apply 1 application topically 2 (two) times daily as needed for rash. 05/04/17  Yes [provider]  pantoprazole (PROTONIX) 40 MG tablet Take 40 mg by mouth every morning.    Yes [provider]  rosuvastatin (CRESTOR) 5 MG tablet Take 5 mg by mouth every Monday, Wednesday, and Friday.   Yes [provider]  TOUJEO SOLOSTAR 300 UNIT/ML SOPN Inject 9 Units as directed every evening.  12/08/14  Yes [provider]  zinc oxide (BALMEX) 11.3 % CREA cream Apply 1 application topically daily.   Yes [provider]    Physical Exam: Vitals:   05/12/17 1425 05/12/17 1500 05/12/17 1600 05/12/17 1700  BP: (!) 146/54 (!) 135/51 (!) 147/57 (!) 133/54  Pulse: 74 75 71 64  Resp: Temp: (!) 97.4 F (36.3 C)     TempSrc: Axillary     SpO2: 100% 100% 100% 100%   Constitutional: NAD Eyes: PERRL, lids and conjunctivae  normal ENMT: Mucous membranes are dry. Posterior pharynx clear of any exudate or lesions. Neck: normal, supple, no masses, no thyromegaly Respiratory: clear to auscultation bilaterally, no wheezing, no crackles. Normal respiratory effort. No accessory muscle use.  Cardiovascular: Regular rate and rhythm, no murmurs / rubs / gallops. Pedal pulses intact  Abdomen: no tenderness, no masses palpated. No hepatosplenomegaly. Bowel sounds positive.  Musculoskeletal: Right-sided hemiparesis, bilateral lower extremity edema  Skin: no rashes noted Neurologic: Alert, follows simple, nonverbal right-sided hemiparesis.  Psychiatric: Normal judgment  and insight. Alert and oriented x 3. Normal mood.   Labs on Admission: I have personally reviewed following labs and imaging studies  CBC:  Recent Labs Lab 05/12/17 1423  WBC 7.6  HGB 10.7*  HCT 32.0*  MCV 86.0  PLT 229   Basic Metabolic Panel:  Recent Labs Lab 05/12/17 1423  NA 121*  K 5.2*  CL 90*  CO2 20*  GLUCOSE 166*  BUN 28*  CREATININE 1.12*  CALCIUM 9.4   GFR: CrCl cannot be calculated (Unknown ideal weight.). Liver Function Tests:  Recent Labs Lab 05/12/17 1423  AST 19  ALT 6*  ALKPHOS 76  BILITOT 0.5  PROT 7.0  ALBUMIN 2.8*   No results for input(s): LIPASE, AMYLASE in the last 168 hours. No results for input(s): AMMONIA in the last 168 hours. Coagulation Profile: No results for input(s): INR, PROTIME in the last 168 hours. Cardiac Enzymes: No results for input(s): CKTOTAL, CKMB, CKMBINDEX, TROPONINI in the last 168 hours. BNP (last 3 results) No results for input(s): PROBNP in the last 8760 hours. HbA1C: No results for input(s): HGBA1C in the last 72 hours. CBG:  Recent Labs Lab 05/12/17 1431  GLUCAP 168*   Lipid Profile: No results for input(s): CHOL, HDL, LDLCALC, TRIG, CHOLHDL, LDLDIRECT in the last 72 hours. Thyroid Function Tests: No results for input(s): TSH, T4TOTAL, FREET4, T3FREE, THYROIDAB in the last 72 hours. Anemia Panel: No results for input(s): VITAMINB12, FOLATE, FERRITIN, TIBC, IRON, RETICCTPCT in the last 72 hours. Urine analysis:    Component Value Date/Time   COLORURINE STRAW (A) 05/12/2017 1707   APPEARANCEUR CLEAR 05/12/2017 1707   LABSPEC 1.008 05/12/2017 1707   PHURINE 7.0 05/12/2017 1707   GLUCOSEU 50 (A) 05/12/2017 1707   HGBUR MODERATE (A) 05/12/2017 1707   BILIRUBINUR NEGATIVE 05/12/2017 1707   KETONESUR NEGATIVE 05/12/2017 1707   PROTEINUR NEGATIVE 05/12/2017 1707   UROBILINOGEN 0.2 10/23/2014 0830   NITRITE NEGATIVE 05/12/2017 1707   LEUKOCYTESUR SMALL (A) 05/12/2017 1707   Sepsis Labs:  !!!!!!!!!!!!!!!!!!!!!!!!!!!!!!!!!!!!!!!!!!!! (procalcitonin:4,lacticidven:4) )No results found for this or any previous visit (from the past 240 hour(s)).   Radiological Exams on Admission: Ct Head Wo Contrast  Result Date: 05/12/2017 CLINICAL DATA:  Altered level of consciousness. EXAM: CT HEAD WITHOUT CONTRAST TECHNIQUE: Contiguous axial images were obtained from the base of the skull through the vertex without intravenous contrast. COMPARISON:  Head CT dated 05/10/2016. FINDINGS: Brain: Generalized age related parenchymal atrophy with commensurate dilatation of the ventricles and sulci. Chronic small vessel ischemic changes within the bilateral periventricular and subcortical white matter. Small old lacunar infarcts within the bilateral basal ganglia. No mass, hemorrhage, edema or other evidence of acute parenchymal abnormality. No extra-axial hemorrhage. Vascular: There are chronic calcified atherosclerotic changes of the large vessels at the skull base. No unexpected hyperdense vessel. Skull: Normal. Negative for fracture or focal lesion. Sinuses/Orbits: Characterization limited by patient motion artifact, but no gross abnormality. Other: None. IMPRESSION: 1. No acute findings.  No intracranial  mass, hemorrhage or edema. 2. Atrophy and chronic ischemic changes, as detailed above. Electronically Signed   By: Bary Richard M.D.   On: 05/12/2017 16:36   Dg Chest Portable 1 View  Result Date: 05/12/2017 CLINICAL DATA:  Altered mental status EXAM: PORTABLE CHEST 1 VIEW COMPARISON:  Chest x-ray dated 04/24/2014. FINDINGS: Heart size and mediastinal contours are stable. Atherosclerotic changes again noted at the aortic arch. Study is hypoinspiratory with crowding of the perihilar and bibasilar bronchovascular markings. Given these low lung volumes, lungs appear clear, possible mild bibasilar fibrosis or bronchiectasis. No pleural effusion or pneumothorax seen. No acute or suspicious osseous  finding. IMPRESSION: 1. No active disease.  No evidence of pneumonia or pulmonary edema. 2. Possible mild bibasilar fibrosis and/or bronchiectasis. 3. Aortic atherosclerosis. Electronically Signed   By: Bary Richard M.D.   On: 05/12/2017 17:26    EKG: Independently reviewed. Normal sinus rhythm  Assessment/Plan Acute encephalopathy secondary to hyponatremia Admit to telemetry IV fluids CT head negative for acute events if doesn't perk up with IV fluids consider MRI if the patient has history of stroke in the past UA and chest x-ray negative Check BMP every 12 hours  Acute renal failure with mild hyperkalemia We'll continue IV fluids K should trending down with hydration If still high can order a dose of Lasix that will help with hyponatremia as well Continue to monitor  Seizures Per family no recent seizing activity Continue home medication Depakote  Severe dementia Holding medications for now, sure if there is any benefit Namenda at this point  Diabetes mellitus type 2 We will hold Lantus as patient not eating will place on SSI, check A1c and monitor CBGs When patient able to tolerate oral diet resume Lantus  DVT prophylaxis:  Lovenox Code Status: Full code - this need to be discussed further as daughter went to talk to her sisters Family Communication: Daughter at bedside  Disposition Plan: Anticipate discharge to previous home environment.  Consults called: none  Admission status:  inpatient telemetry  Latrelle Dodrill MD [facility] Pager: Text Page via [email or web address]  [phone]  If 7PM-7AM, please contact night-coverage [email or web address] Password [facility]  05/12/2017, 6:17 PM

## 2017-05-12 NOTE — ED Triage Notes (Signed)
Per EMS pt from home with family and has home health nurse. Home health noted patient baseline is alert and oriented but noted since Sunday she has been increasingly altered- became non-verbal, not following commands. This is similar to previous UTI- pending UA results from staff. Patient has bilateral contractures, alert and able to track people visually.    CBG 181 HR 72 BP140/78 EKG - AV 1 degree block

## 2017-05-12 NOTE — ED Notes (Signed)
Patient transported to CT 

## 2017-05-12 NOTE — ED Notes (Signed)
Attempted report x1. 

## 2017-05-12 NOTE — ED Provider Notes (Addendum)
[facility]-EMERGENCY DEPT Provider Note   CSN: [id number] Arrival date & time: 05/12/17  1411     History   Chief Complaint Chief Complaint  Patient presents with  . Altered Mental Status   Level V caveat: Altered metal status/noncommunicative  HPI Megan [name] is a 83 y.o. female.  HPI Patient is a 83 year old female presents to the emergency department for increasing confusion and decrease ability to communicate per the family member.  She has a history of diabetes, seizure, stroke his had no recent illness.  No reports of recent vomiting or diarrhea.  No reports of recent fever.  No falls.  Patient unable to provide any reasonable history.  Decreased oral intake over the past 24 hours.  Does report some increasing urinary incontinence   Past Medical History:  Diagnosis Date  . Diabetes mellitus   . Hyperlipidemia   . Seizure (HCC)   . Stroke [facility]) 2012   possible stroke Jan 2016    Patient Active Problem List   Diagnosis Date Noted  . Stroke (HCC)   . Acute renal failure superimposed on stage 3 chronic kidney disease (HCC) 10/23/2014  . Dehydration 10/23/2014  . Acute hyperkalemia 10/23/2014  . Abnormal urinalysis 10/23/2014  . CVA (cerebral infarction) 10/23/2014  . Chest pain 04/24/2014  . Acute encephalopathy 08/09/2013  . Altered mental status 08/08/2013  . Dementia with Lewy bodies 08/07/2013  . Vascular dementia 08/07/2013  . Sleepiness 08/07/2013  . Anemia 07/21/2013  . Protein-calorie malnutrition, severe (HCC) 07/15/2013  . Herpes zoster 07/13/2013  . Spastic hemiplegia affecting dominant side (HCC) 06/25/2013  . History of CVA w/residual right side deficits 09/03/2012  . History of partial seizures   . TIA (transient ischemic attack) 01/06/2012  . Community acquired pneumonia 01/06/2012  . Diabetes mellitus type II, uncontrolled (HCC) 01/06/2012  . HTN (hypertension) 01/06/2012  . H/O 01/06/2012    Past Surgical History:  Procedure Laterality Date  .  CATARACT EXTRACTION      OB History    No data available       Home Medications    Prior to Admission medications   Medication Sig Start Date End Date Taking? Authorizing Provider  clopidogrel (PLAVIX) 75 MG tablet Take 1 tablet (75 mg total) by mouth daily with breakfast. 08/15/13  Yes [name], MD  divalproex (DEPAKOTE ER) 500 MG 24 hr tablet Take 1 tablet (500 mg total) by mouth at bedtime. 07/14/16  Yes Micki Riley, MD  furosemide (LASIX) 20 MG tablet Take 20 mg by mouth daily as needed. 04/13/17  Yes [provider]  guaiFENesin (MUCINEX) 600 MG 12 hr tablet Take 600 mg by mouth 2 (two) times daily as needed for to loosen phlegm.   Yes [provider]  memantine (NAMENDA XR) 28 MG CP24 24 hr capsule Take 1 capsule (28 mg total) by mouth daily. 12/09/15  Yes Micki Riley, MD  NYSTATIN powder Apply 1 application topically 2 (two) times daily as needed for rash. 05/04/17  Yes [provider]  pantoprazole (PROTONIX) 40 MG tablet Take 40 mg by mouth every morning.    Yes [provider]  rosuvastatin (CRESTOR) 5 MG tablet Take 5 mg by mouth every Monday, Wednesday, and Friday.   Yes [provider]  TOUJEO SOLOSTAR 300 UNIT/ML SOPN Inject 9 Units as directed every evening.  12/08/14  Yes [provider]  zinc oxide (BALMEX) 11.3 % CREA cream Apply 1 application topically daily.   Yes [provider]  Family History Family History  Problem Relation Age of Onset  . CAD Mother   . Hypertension Mother   . Diabetes Mother   . Stroke Mother     Social History Social History  Substance Use Topics  . Smoking status: Never Smoker  . Smokeless tobacco: Former Neurosurgeon    Types: Snuff     Comment: quit 50-60 years ago  . Alcohol use No     Allergies   Lipitor [atorvastatin]   Review of Systems Review of Systems  Unable to perform ROS: Mental status change     Physical Exam Updated Vital Signs BP (!)  135/51   Pulse 75   Temp (!) 97.4 F (36.3 C) (Axillary)   Resp 16   SpO2 100%   Physical Exam  Constitutional: She is oriented to person, place, and time. She appears well-developed and well-nourished. No distress.  HENT:  Head: Normocephalic and atraumatic.  Eyes: EOM are normal.  Neck: Normal range of motion.  Cardiovascular: Normal rate, regular rhythm and normal heart sounds.   Pulmonary/Chest: Effort normal and breath sounds normal.  Abdominal: Soft. She exhibits no distension. There is no tenderness.  Musculoskeletal: Normal range of motion.  Neurological: She is alert and oriented to person, place, and time.  Skin: Skin is warm and dry.  Psychiatric: She has a normal mood and affect. Judgment normal.  Nursing note and vitals reviewed.    ED Treatments / Results  Labs (all labs ordered are listed, but only abnormal results are displayed) Labs Reviewed  COMPREHENSIVE METABOLIC PANEL - Abnormal; Notable for the following:       Result Value   Sodium 121 (*)    Potassium 5.2 (*)    Chloride 90 (*)    CO2 20 (*)    Glucose, Bld 166 (*)    BUN 28 (*)    Creatinine, Ser 1.12 (*)    Albumin 2.8 (*)    ALT 6 (*)    GFR calc non Af Amer 41 (*)    GFR calc Af Amer 48 (*)    All other components within normal limits  CBC - Abnormal; Notable for the following:    RBC 3.72 (*)    Hemoglobin 10.7 (*)    HCT 32.0 (*)    All other components within normal limits  CBG MONITORING, ED - Abnormal; Notable for the following:    Glucose-Capillary 168 (*)    All other components within normal limits  URINALYSIS, ROUTINE W REFLEX MICROSCOPIC  I-STAT CG4 LACTIC ACID, ED    EKG  EKG Interpretation  Date/Time:  Thursday May 12 2017 14:25:46 EDT Ventricular Rate:  74 PR Interval:    QRS Duration: 85 QT Interval:  365 QTC Calculation: 405 R Axis:   -16 Text Interpretation:  Sinus rhythm Prolonged PR interval Borderline left axis deviation No significant change was  found Confirmed by Azalia Bilis ([id number]) on 05/12/2017 4:22:50 PM       Radiology No results found.  Procedures Procedures (including critical care time)  Medications Ordered in ED Medications  sodium chloride 0.9 % bolus 1,000 mL (not administered)     Initial Impression / Assessment and Plan / ED Course  I have reviewed the triage vital signs and the nursing notes.  Pertinent labs & imaging results that were available during my care of the patient were reviewed by me and considered in my medical decision making (see chart for details).     Altered mental status.Marland Kitchen  Likely secondary to hyponatremia.  Patient be admitted for ongoing workup.  Labs and urine pending at this time.  Head CT personally reviewed by myself and demonstrates atrophy without acute abnormality  Final Clinical Impressions(s) / ED Diagnoses   Final diagnoses:  Hyponatremia  Altered mental status, unspecified altered mental status type    New Prescriptions New Prescriptions   No medications on file     Azalia Bilis, MD 05/12/17 1638    Azalia Bilis, MD 06/16/17 [phone]

## 2017-05-13 LAB — BASIC METABOLIC PANEL
Anion gap: 6 (ref 5–15)
Anion gap: 5 (ref 5–15)
BUN: 17 mg/dL (ref 6–20)
BUN: 16 mg/dL (ref 6–20)
CHLORIDE: 102 mmol/L (ref 101–111)
CO2: 22 mmol/L (ref 22–32)
CO2: 23 mmol/L (ref 22–32)
CREATININE: 0.82 mg/dL (ref 0.44–1.00)
CREATININE: 0.83 mg/dL (ref 0.44–1.00)
Calcium: 8.9 mg/dL (ref 8.9–10.3)
Calcium: 9 mg/dL (ref 8.9–10.3)
Chloride: 99 mmol/L — ABNORMAL LOW (ref 101–111)
GFR calc Af Amer: >60 mL/min (ref >60)
GFR calc Af Amer: >60 mL/min (ref >60)
GFR calc non Af Amer: 59 mL/min — ABNORMAL LOW (ref >60)
GFR, EST NON AFRICAN AMERICAN: 60 mL/min — AB (ref >60)
Glucose, Bld: 166 mg/dL — ABNORMAL HIGH (ref 65–99)
Glucose, Bld: 182 mg/dL — ABNORMAL HIGH (ref 65–99)
Potassium: 4.9 mmol/L (ref 3.5–5.1)
Potassium: 5 mmol/L (ref 3.5–5.1)
SODIUM: 127 mmol/L — AB (ref 135–145)
SODIUM: 130 mmol/L — AB (ref 135–145)

## 2017-05-13 LAB — GLUCOSE, CAPILLARY
GLUCOSE-CAPILLARY: 177 mg/dL — AB (ref 65–99)
GLUCOSE-CAPILLARY: 250 mg/dL — AB (ref 65–99)
Glucose-Capillary: 146 mg/dL — ABNORMAL HIGH (ref 65–99)
Glucose-Capillary: 258 mg/dL — ABNORMAL HIGH (ref 65–99)

## 2017-05-13 LAB — CBC
HEMATOCRIT: 30.5 % — AB (ref 36.0–46.0)
Hemoglobin: 10.2 g/dL — ABNORMAL LOW (ref 12.0–15.0)
MCH: 28.8 pg (ref 26.0–34.0)
MCHC: 33.4 g/dL (ref 30.0–36.0)
MCV: 86.2 fL (ref 78.0–100.0)
PLATELETS: 237 10*3/uL (ref 150–400)
RBC: 3.54 MIL/uL — AB (ref 3.87–5.11)
RDW: 14.5 % (ref 11.5–15.5)
WBC: 6 10*3/uL (ref 4.0–10.5)

## 2017-05-13 MED ORDER — DIVALPROEX SODIUM ER 250 MG PO TB24
500.0000 mg | ORAL_TABLET | Freq: Every day | ORAL | Status: DC
  Administered 2017-05-13 – 2017-05-20 (×8): 500 mg via ORAL
  Filled 2017-05-13 (×8): qty 2

## 2017-05-13 MED ORDER — INFLUENZA VAC SPLIT HIGH-DOSE 0.5 ML IM SUSY
0.5000 mL | PREFILLED_SYRINGE | INTRAMUSCULAR | Status: AC
  Administered 2017-05-14: 0.5 mL via INTRAMUSCULAR
  Filled 2017-05-13 (×2): qty 0.5

## 2017-05-13 NOTE — Care Management Note (Signed)
Case Management Note  Patient Details  Name: Megan [name] MRN: [id number] Date of Birth: 03-Feb-1922  Subjective/Objective:                    Action/Plan:   Expected Discharge Date:  05/14/17               Expected Discharge Plan:  Home w Home Health Services  In-House Referral:  NA  Discharge planning Services  CM Consult  Post Acute Care Choice:  Home Health Choice offered to:  Adult Children  DME Arranged:    DME Agency:     HH Arranged:  RN, PT HH aide.  HH Agency:  Other - See comment (Pt active with [facility])  Status of Service:  In process, will continue to follow  If discussed at Long Length of Stay Meetings, dates discussed:    Additional Comments:  Starlyn Skeans, RN 05/13/2017, 3:00 PM

## 2017-05-13 NOTE — Progress Notes (Addendum)
PROGRESS NOTE    Megan [name]  ZOX:[id number] DOB: Jul 06, 1922 DOA: 05/12/2017 PCP: [name], MD    Brief Narrative: 83 y.o. female with medical history significant of severe dementia, diabetes, seizures and history of stroke with right hemiparesis presented to the emergency department after was brought by her family with confusion and not being herself. Patient not verbal my exam history provided by daughter. She reports that patient 4 days ago started being herself not talking much and urinating herself. She also reported decreased po intake. Daughter by the bed side,feeding the patient.she is awake looking at me.   Assessment & Plan:   Active Problems:   Hyponatremia Acute encephalopathy secondary to hyponatremia.sodium still low.continue slow iv hydration with NS. Hyponatremia due to decreased by mouth intake. Sodium improved after IV hydration with normal saline. Daughter reports that she is much better today than yesterday more awake,but not quite back to baseline yet. CT head was negative for any acute changes. Chest x-ray was negative UA was negative follow-up same BMP tomorrow.  Seizure disorder stable on Depakote.  Type 2 diabetes restart Lantus once patient tolerates by mouth intake.         DVT prophylaxis:  Code Status:  Family Communication:  Disposition Plan:    Consultants:   Procedures:   Antimicrobials:    Subjective:  Objective: Vitals:   05/12/17 1930 05/12/17 2127 05/13/17 0524 05/13/17 0824  BP: (!) 134/59 (!) 170/65 (!) 118/42 (!) 98/49  Pulse: 74 83 76 80  Resp: Temp:  97.6 F (36.4 C) 98.1 F (36.7 C) 98.4 F (36.9 C)  TempSrc:  Oral Oral Oral  SpO2: 100% 100% 98% 98%  Weight:  76.4 kg (168 lb 6.4 oz)      Intake/Output Summary (Last 24 hours) at 05/13/17 1132 Last data filed at 05/13/17 0837  Gross per 24 hour  Intake             1044 ml  Output             1900 ml  Net             -856 ml   Filed Weights   05/12/17 2127  Weight: 76.4 kg (168 lb 6.4 oz)    Examination:  General exam: Appears calm and comfortable  Respiratory system: Clear to auscultation. Respiratory effort normal. Cardiovascular system: S1 & S2 heard, RRR. No JVD, murmurs, rubs, gallops or clicks. No pedal edema. Gastrointestinal system: Abdomen is nondistended, soft and nontender. No organomegaly or masses felt. Normal bowel sounds heard. Central nervous system: Alert and oriented. No focal neurological deficits. Extremities: Symmetric 5 x 5 power. Skin: No rashes, lesions or ulcers Psychiatry: Judgement and insight appear normal. Mood & affect appropriate.     Data Reviewed: I have personally reviewed following labs and imaging studies  CBC:  Recent Labs Lab 05/12/17 1423 05/13/17 0559  WBC 7.6 6.0  HGB 10.7* 10.2*  HCT 32.0* 30.5*  MCV 86.0 86.2  PLT 229 237   Basic Metabolic Panel:  Recent Labs Lab 05/12/17 1423 05/12/17 1940 05/13/17 0559  NA 121* 125* 127*  K 5.2* 5.2* 4.9  CL 90* 95* 99*  CO2 20* 20* 22  GLUCOSE 166* 139* 166*  BUN 28* 24* 17  CREATININE 1.12* 1.00 0.82  CALCIUM 9.4 9.2 8.9   GFR: CrCl cannot be calculated (Unknown ideal weight.). Liver Function Tests:  Recent Labs Lab 05/12/17 1423  AST 19  ALT 6*  ALKPHOS  76  BILITOT 0.5  PROT 7.0  ALBUMIN 2.8*   No results for input(s): LIPASE, AMYLASE in the last 168 hours. No results for input(s): AMMONIA in the last 168 hours. Coagulation Profile: No results for input(s): INR, PROTIME in the last 168 hours. Cardiac Enzymes: No results for input(s): CKTOTAL, CKMB, CKMBINDEX, TROPONINI in the last 168 hours. BNP (last 3 results) No results for input(s): PROBNP in the last 8760 hours. HbA1C:  Recent Labs  05/12/17 1940  HGBA1C 7.1*   CBG:  Recent Labs Lab 05/12/17 1431 05/12/17 2132 05/13/17 0749 05/13/17 1055  GLUCAP 168* 136* 146* 177*   Lipid Profile: No results for input(s): CHOL, HDL, LDLCALC, TRIG,  CHOLHDL, LDLDIRECT in the last 72 hours. Thyroid Function Tests:  Recent Labs  05/12/17 1940  TSH 1.654   Anemia Panel: No results for input(s): VITAMINB12, FOLATE, FERRITIN, TIBC, IRON, RETICCTPCT in the last 72 hours. Sepsis Labs:  Recent Labs Lab 05/12/17 1750  LATICACIDVEN 1.57    No results found for this or any previous visit (from the past 240 hour(s)).       Radiology Studies: Ct Head Wo Contrast  Result Date: 05/12/2017 CLINICAL DATA:  Altered level of consciousness. EXAM: CT HEAD WITHOUT CONTRAST TECHNIQUE: Contiguous axial images were obtained from the base of the skull through the vertex without intravenous contrast. COMPARISON:  Head CT dated 05/10/2016. FINDINGS: Brain: Generalized age related parenchymal atrophy with commensurate dilatation of the ventricles and sulci. Chronic small vessel ischemic changes within the bilateral periventricular and subcortical white matter. Small old lacunar infarcts within the bilateral basal ganglia. No mass, hemorrhage, edema or other evidence of acute parenchymal abnormality. No extra-axial hemorrhage. Vascular: There are chronic calcified atherosclerotic changes of the large vessels at the skull base. No unexpected hyperdense vessel. Skull: Normal. Negative for fracture or focal lesion. Sinuses/Orbits: Characterization limited by patient motion artifact, but no gross abnormality. Other: None. IMPRESSION: 1. No acute findings.  No intracranial mass, hemorrhage or edema. 2. Atrophy and chronic ischemic changes, as detailed above. Electronically Signed   By: Bary Richard M.D.   On: 05/12/2017 16:36   Dg Chest Portable 1 View  Result Date: 05/12/2017 CLINICAL DATA:  Altered mental status EXAM: PORTABLE CHEST 1 VIEW COMPARISON:  Chest x-ray dated 04/24/2014. FINDINGS: Heart size and mediastinal contours are stable. Atherosclerotic changes again noted at the aortic arch. Study is hypoinspiratory with crowding of the perihilar and bibasilar  bronchovascular markings. Given these low lung volumes, lungs appear clear, possible mild bibasilar fibrosis or bronchiectasis. No pleural effusion or pneumothorax seen. No acute or suspicious osseous finding. IMPRESSION: 1. No active disease.  No evidence of pneumonia or pulmonary edema. 2. Possible mild bibasilar fibrosis and/or bronchiectasis. 3. Aortic atherosclerosis. Electronically Signed   By: Bary Richard M.D.   On: 05/12/2017 17:26        Scheduled Meds: . clopidogrel  75 mg Oral Q breakfast  . divalproex  500 mg Oral QHS  . enoxaparin (LOVENOX) injection  40 mg Subcutaneous Q24H  . [START ON 05/14/2017] Influenza vac split quadrivalent PF  0.5 mL Intramuscular Tomorrow-1000  . insulin aspart  0-15 Units Subcutaneous TID WC   Continuous Infusions: . sodium chloride 75 mL (05/13/17 0837)     LOS: 1 day     Alwyn Ren, MD [facility]  If 7PM-7AM, please contact night-coverage [email or web address] Password [facility] 05/13/2017, 11:32 AM

## 2017-05-14 LAB — GLUCOSE, CAPILLARY
GLUCOSE-CAPILLARY: 137 mg/dL — AB (ref 65–99)
GLUCOSE-CAPILLARY: 190 mg/dL — AB (ref 65–99)
GLUCOSE-CAPILLARY: 216 mg/dL — AB (ref 65–99)

## 2017-05-14 LAB — VALPROIC ACID LEVEL: Valproic Acid Lvl: 58 ug/mL (ref 50.0–100.0)

## 2017-05-14 MED ORDER — ALPRAZOLAM 0.5 MG PO TABS
0.5000 mg | ORAL_TABLET | Freq: Once | ORAL | Status: AC | PRN
  Administered 2017-05-16: 0.5 mg via ORAL
  Filled 2017-05-14 (×2): qty 1

## 2017-05-14 MED ORDER — POLYETHYLENE GLYCOL 3350 17 G PO PACK
17.0000 g | PACK | Freq: Every day | ORAL | Status: DC
  Administered 2017-05-14 – 2017-05-20 (×4): 17 g via ORAL
  Filled 2017-05-14 (×6): qty 1

## 2017-05-14 MED ORDER — MAGNESIUM HYDROXIDE 400 MG/5ML PO SUSP
15.0000 mL | Freq: Every day | ORAL | Status: DC | PRN
  Administered 2017-05-14: 15 mL via ORAL

## 2017-05-14 MED ORDER — MAGNESIUM HYDROXIDE 400 MG/5ML PO SUSP
15.0000 mL | Freq: Every day | ORAL | Status: DC | PRN
  Filled 2017-05-14: qty 30

## 2017-05-14 MED ORDER — WHITE PETROLATUM GEL
Status: AC
  Administered 2017-05-14: 18:00:00
  Filled 2017-05-14: qty 1

## 2017-05-14 MED ORDER — ALPRAZOLAM ER 0.5 MG PO TB24: 0.5000 mg | ORAL_TABLET | Freq: Once | ORAL | Status: DC | PRN

## 2017-05-14 NOTE — Progress Notes (Signed)
PROGRESS NOTE    Megan [name]  UEA:[id number] DOB: Oct 25, 1921 DOA: 05/12/2017 PCP: [name], MD   Brief Narrative: 83 y.o.femalewith medical history significant of severe dementia, diabetes, seizures and history of stroke with right hemiparesis presented to the emergency department after was brought by her family with confusion and not being herself. Patient not verbal my exam history provided by daughter. She reports that patient 4 days ago started being herself not talking much and urinating herself. She also reported decreased po intake. Daughter by the bed side,patient is awake.daughter reports patient better than yesterday.   Assessment & Plan:   Active Problems:   Hyponatremia Acute metabolic encephalopathy secondary to hyponatremia.sodium still low.continue slow iv hydration with NS. Hyponatremia due to decreased by mouth intake. Sodium improved after IV hydration with normal saline. Daughter reports that she is much better today than yesterday more awake,but not quite back to baseline yet. CT head was negative for any acute changes. Chest x-ray was negative UA was negative follow-up check BMP tomorrow.  Seizure disorder stable on Depakote.depakote level 58.  Type 2 diabetes restart Lantus once patient tolerates by mouth intake.      DVT prophylaxis: lovenox Code Status: full Family Communication:  Disposition Plan: tbd   Consultants: none  Procedures:   Antimicrobials: none   Subjective:  Objective: Vitals:   05/13/17 0824 05/13/17 2109 05/14/17 0608 05/14/17 0900  BP: (!) 98/49 (!) 123/53 140/85 (!) 142/34  Pulse: 80 82 85 74  Resp: Temp: 98.4 F (36.9 C) 98.5 F (36.9 C) 98.6 F (37 C) 98.5 F (36.9 C)  TempSrc: Oral Oral Oral Oral  SpO2: 98% 100% 96% 98%  Weight:  76.4 kg (168 lb 6.3 oz)    Height:   (1.727 m)      Intake/Output Summary (Last 24 hours) at 05/14/17 1151 Last data filed at 05/14/17 0900  Gross per 24 hour    Intake          2088.75 ml  Output             1100 ml  Net           988.75 ml   Filed Weights   05/12/17 2127 05/13/17 2109  Weight: 76.4 kg (168 lb 6.4 oz) 76.4 kg (168 lb 6.3 oz)    Examination:  General exam: Appears calm and comfortable  Respiratory system: Clear to auscultation. Respiratory effort normal. Cardiovascular system: S1 & S2 heard, RRR. No JVD, murmurs, rubs, gallops or clicks. No pedal edema. Gastrointestinal system: Abdomen is nondistended, soft and nontender. No organomegaly or masses felt. Normal bowel sounds heard. Central nervous system: Alert and oriented. No focal neurological deficits. Extremities: Symmetric 5 x 5 power. Skin: No rashes, lesions or ulcers Psychiatry: Judgement and insight appear normal. Mood & affect appropriate.     Data Reviewed: I have personally reviewed following labs and imaging studies  CBC:  Recent Labs Lab 05/12/17 1423 05/13/17 0559  WBC 7.6 6.0  HGB 10.7* 10.2*  HCT 32.0* 30.5*  MCV 86.0 86.2  PLT 229 237   Basic Metabolic Panel:  Recent Labs Lab 05/12/17 1423 05/12/17 1940 05/13/17 0559 05/13/17 1231  NA 121* 125* 127* 130*  K 5.2* 5.2* 4.9 5.0  CL 90* 95* 99* 102  CO2 20* 20* 22 23  GLUCOSE 166* 139* 166* 182*  BUN 28* 24* 17 16  CREATININE 1.12* 1.00 0.82 0.83  CALCIUM 9.4 9.2 8.9 9.0   GFR: Estimated  Creatinine Clearance: 42.7 mL/min (by C-G formula based on SCr of 0.83 mg/dL). Liver Function Tests:  Recent Labs Lab 05/12/17 1423  AST 19  ALT 6*  ALKPHOS 76  BILITOT 0.5  PROT 7.0  ALBUMIN 2.8*   No results for input(s): LIPASE, AMYLASE in the last 168 hours. No results for input(s): AMMONIA in the last 168 hours. Coagulation Profile: No results for input(s): INR, PROTIME in the last 168 hours. Cardiac Enzymes: No results for input(s): CKTOTAL, CKMB, CKMBINDEX, TROPONINI in the last 168 hours. BNP (last 3 results) No results for input(s): PROBNP in the last 8760  hours. HbA1C:  Recent Labs  05/12/17 1940  HGBA1C 7.1*   CBG:  Recent Labs Lab 05/13/17 0749 05/13/17 1055 05/13/17 1645 05/13/17 2107 05/14/17 0843  GLUCAP 146* 177* 250* 258* 137*   Lipid Profile: No results for input(s): CHOL, HDL, LDLCALC, TRIG, CHOLHDL, LDLDIRECT in the last 72 hours. Thyroid Function Tests:  Recent Labs  05/12/17 1940  TSH 1.654   Anemia Panel: No results for input(s): VITAMINB12, FOLATE, FERRITIN, TIBC, IRON, RETICCTPCT in the last 72 hours. Sepsis Labs:  Recent Labs Lab 05/12/17 1750  LATICACIDVEN 1.57    No results found for this or any previous visit (from the past 240 hour(s)).       Radiology Studies: Ct Head Wo Contrast  Result Date: 05/12/2017 CLINICAL DATA:  Altered level of consciousness. EXAM: CT HEAD WITHOUT CONTRAST TECHNIQUE: Contiguous axial images were obtained from the base of the skull through the vertex without intravenous contrast. COMPARISON:  Head CT dated 05/10/2016. FINDINGS: Brain: Generalized age related parenchymal atrophy with commensurate dilatation of the ventricles and sulci. Chronic small vessel ischemic changes within the bilateral periventricular and subcortical white matter. Small old lacunar infarcts within the bilateral basal ganglia. No mass, hemorrhage, edema or other evidence of acute parenchymal abnormality. No extra-axial hemorrhage. Vascular: There are chronic calcified atherosclerotic changes of the large vessels at the skull base. No unexpected hyperdense vessel. Skull: Normal. Negative for fracture or focal lesion. Sinuses/Orbits: Characterization limited by patient motion artifact, but no gross abnormality. Other: None. IMPRESSION: 1. No acute findings.  No intracranial mass, hemorrhage or edema. 2. Atrophy and chronic ischemic changes, as detailed above. Electronically Signed   By: Bary Richard M.D.   On: 05/12/2017 16:36   Dg Chest Portable 1 View  Result Date: 05/12/2017 CLINICAL DATA:   Altered mental status EXAM: PORTABLE CHEST 1 VIEW COMPARISON:  Chest x-ray dated 04/24/2014. FINDINGS: Heart size and mediastinal contours are stable. Atherosclerotic changes again noted at the aortic arch. Study is hypoinspiratory with crowding of the perihilar and bibasilar bronchovascular markings. Given these low lung volumes, lungs appear clear, possible mild bibasilar fibrosis or bronchiectasis. No pleural effusion or pneumothorax seen. No acute or suspicious osseous finding. IMPRESSION: 1. No active disease.  No evidence of pneumonia or pulmonary edema. 2. Possible mild bibasilar fibrosis and/or bronchiectasis. 3. Aortic atherosclerosis. Electronically Signed   By: Bary Richard M.D.   On: 05/12/2017 17:26        Scheduled Meds: . clopidogrel  75 mg Oral Q breakfast  . divalproex  500 mg Oral QHS  . enoxaparin (LOVENOX) injection  40 mg Subcutaneous Q24H  . Influenza vac split quadrivalent PF  0.5 mL Intramuscular Tomorrow-1000  . insulin aspart  0-15 Units Subcutaneous TID WC  . polyethylene glycol  17 g Oral Daily   Continuous Infusions: . sodium chloride 75 mL/hr at 05/14/17 0810     LOS: 2  days     Alwyn Ren, MD [facility]  If 7PM-7AM, please contact night-coverage [email or web address] Password [facility] 05/14/2017, 11:51 AM

## 2017-05-14 NOTE — Progress Notes (Signed)
Family did not want to do CBG check tonight. Stated pt just ate and it would not be accurate to just not do it tonight.   Larey Days, RN

## 2017-05-15 ENCOUNTER — Inpatient Hospital Stay (HOSPITAL_COMMUNITY): Payer: Medicare Other

## 2017-05-15 LAB — BASIC METABOLIC PANEL
Anion gap: 5 (ref 5–15)
BUN: 10 mg/dL (ref 6–20)
CHLORIDE: 104 mmol/L (ref 101–111)
CO2: 22 mmol/L (ref 22–32)
CREATININE: 0.79 mg/dL (ref 0.44–1.00)
Calcium: 8.9 mg/dL (ref 8.9–10.3)
GFR calc non Af Amer: >60 mL/min (ref >60)
Glucose, Bld: 183 mg/dL — ABNORMAL HIGH (ref 65–99)
Potassium: 4.9 mmol/L (ref 3.5–5.1)
SODIUM: 131 mmol/L — AB (ref 135–145)

## 2017-05-15 LAB — GLUCOSE, CAPILLARY
GLUCOSE-CAPILLARY: 140 mg/dL — AB (ref 65–99)
GLUCOSE-CAPILLARY: 170 mg/dL — AB (ref 65–99)
GLUCOSE-CAPILLARY: 172 mg/dL — AB (ref 65–99)
GLUCOSE-CAPILLARY: 218 mg/dL — AB (ref 65–99)

## 2017-05-15 MED ORDER — LORAZEPAM 2 MG/ML IJ SOLN
0.2500 mg | Freq: Once | INTRAMUSCULAR | Status: AC
  Administered 2017-05-15: 0.25 mg via INTRAVENOUS
  Filled 2017-05-15: qty 1

## 2017-05-15 MED ORDER — GADOBENATE DIMEGLUMINE 529 MG/ML IV SOLN
15.0000 mL | Freq: Once | INTRAVENOUS | Status: AC
  Administered 2017-05-15: 15 mL via INTRAVENOUS

## 2017-05-15 MED ORDER — ALPRAZOLAM 0.25 MG PO TABS
0.2500 mg | ORAL_TABLET | Freq: Once | ORAL | Status: AC
  Administered 2017-05-15: 0.25 mg via ORAL

## 2017-05-15 MED ORDER — ALBUTEROL SULFATE (2.5 MG/3ML) 0.083% IN NEBU: 2.5000 mg | INHALATION_SOLUTION | Freq: Four times a day (QID) | RESPIRATORY_TRACT | Status: DC

## 2017-05-15 MED ORDER — IPRATROPIUM-ALBUTEROL 0.5-2.5 (3) MG/3ML IN SOLN
3.0000 mL | RESPIRATORY_TRACT | Status: DC | PRN
  Administered 2017-05-17: 3 mL via RESPIRATORY_TRACT
  Filled 2017-05-15: qty 3

## 2017-05-15 NOTE — Progress Notes (Signed)
PRN xanax given before MRI MRI called, patient was unable to stay still MD paged and prn ativan ordered and given as ordered

## 2017-05-15 NOTE — Progress Notes (Signed)
PROGRESS NOTE    Megan [name]  ZHY:[id number] DOB: 09-Jan-1922 DOA: 05/12/2017 PCP: [name], MD   Brief Narrative:  83 y.o.femalewith medical history significant of severe dementia, diabetes, seizures and history of stroke with right hemiparesis presented to the emergency department after was brought by her family with confusion and not being herself. Patient not verbal my exam history provided by daughter. She reports that patient 4 days ago started being herself not talking much and urinating herself. She also reported decreased po intake. Daughter by the bed side,patient is awake.daughter reports patient better than yesterday but she is still concerned about the episodes she had yesterday when she was lethargic and was unable to wake her up.vss.   Assessment & Plan:   Active Problems:   Hyponatremia  Acute metabolic encephalopathy secondary to hyponatremia.sodium 131 today.continue slow iv hydration with NS. Hyponatremia due to decreased by mouth intake. Sodium improved after IV hydration with normal saline. Daughter reports that she is much better today than yesterday more awake,but not quite back to baseline yet.CT head was negative for any acute changes. Chest x-ray was negative UA was negative follow-up check BMP tomorrow.check MRI today.patients family requesting a neuro eval.she saw dr patel feb 2018.  Seizure disorder stable on Depakote.depakote level 58.  Type 2 diabetes restart Lantus once patient tolerates by mouth intake.      DVT prophylaxis: lovenox Code Status:CODE STATUS DW DAUGHTER.PATIENT IS A DNR.WILL change to DNR. Family Communication:DAUGHTER Disposition Plan:HOME WITH DAUGHTER   Consultants  Procedures: NONE   Antimicrobials:NONE   Subjective:AWAKE IN NO DISTRESS  Objective: Vitals:   05/13/17 2109 05/14/17 0608 05/14/17 0900 05/14/17 1753  BP: (!) 123/53 140/85 (!) 142/34 (!) 135/50  Pulse: 82 85 74 80  Resp: Temp: 98.5  F (36.9 C) 98.6 F (37 C) 98.5 F (36.9 C) 99.2 F (37.3 C)  TempSrc: Oral Oral Oral Oral  SpO2: 100% 96% 98% 97%  Weight: 76.4 kg (168 lb 6.3 oz)     Height:  (1.727 m)       Intake/Output Summary (Last 24 hours) at 05/15/17 1244 Last data filed at 05/15/17 0600  Gross per 24 hour  Intake               60 ml  Output             1000 ml  Net             -940 ml   Filed Weights   05/12/17 2127 05/13/17 2109  Weight: 76.4 kg (168 lb 6.4 oz) 76.4 kg (168 lb 6.3 oz)    Examination:  General exam: Appears calm and comfortable  Respiratory system: Clear to auscultation. Respiratory effort normal. Cardiovascular system: S1 & S2 heard, RRR. No JVD, murmurs, rubs, gallops or clicks. No pedal edema. Gastrointestinal system: Abdomen is nondistended, soft and nontender. No organomegaly or masses felt. Normal bowel sounds heard. Central nervous system: Alert and oriented. No focal neurological deficits. Extremities: Symmetric 5 x 5 power. Skin: No rashes, lesions or ulcers Psychiatry: Judgement and insight appear normal. Mood & affect appropriate.     Data Reviewed: I have personally reviewed following labs and imaging studies  CBC:  Recent Labs Lab 05/12/17 1423 05/13/17 0559  WBC 7.6 6.0  HGB 10.7* 10.2*  HCT 32.0* 30.5*  MCV 86.0 86.2  PLT 229 237   Basic Metabolic Panel:  Recent Labs Lab 05/12/17 1423 05/12/17 1940 05/13/17 0559 05/13/17 1231 05/15/17  0203  NA 121* 125* 127* 130* 131*  K 5.2* 5.2* 4.9 5.0 4.9  CL 90* 95* 99* 102 104  CO2 20* 20* GLUCOSE 166* 139* 166* 182* 183*  BUN 28* 24* CREATININE 1.12* 1.00 0.82 0.83 0.79  CALCIUM 9.4 9.2 8.9 9.0 8.9   GFR: Estimated Creatinine Clearance: 44.3 mL/min (by C-G formula based on SCr of 0.79 mg/dL). Liver Function Tests:  Recent Labs Lab 05/12/17 1423  AST 19  ALT 6*  ALKPHOS 76  BILITOT 0.5  PROT 7.0  ALBUMIN 2.8*   No results for input(s): LIPASE, AMYLASE in the last  168 hours. No results for input(s): AMMONIA in the last 168 hours. Coagulation Profile: No results for input(s): INR, PROTIME in the last 168 hours. Cardiac Enzymes: No results for input(s): CKTOTAL, CKMB, CKMBINDEX, TROPONINI in the last 168 hours. BNP (last 3 results) No results for input(s): PROBNP in the last 8760 hours. HbA1C:  Recent Labs  05/12/17 1940  HGBA1C 7.1*   CBG:  Recent Labs Lab 05/13/17 2107 05/14/17 0843 05/14/17 1203 05/14/17 1751 05/15/17 0832  GLUCAP 258* 137* 190* 216* 140*   Lipid Profile: No results for input(s): CHOL, HDL, LDLCALC, TRIG, CHOLHDL, LDLDIRECT in the last 72 hours. Thyroid Function Tests:  Recent Labs  05/12/17 1940  TSH 1.654   Anemia Panel: No results for input(s): VITAMINB12, FOLATE, FERRITIN, TIBC, IRON, RETICCTPCT in the last 72 hours. Sepsis Labs:  Recent Labs Lab 05/12/17 1750  LATICACIDVEN 1.57    No results found for this or any previous visit (from the past 240 hour(s)).       Radiology Studies: No results found.      Scheduled Meds: . clopidogrel  75 mg Oral Q breakfast  . divalproex  500 mg Oral QHS  . enoxaparin (LOVENOX) injection  40 mg Subcutaneous Q24H  . insulin aspart  0-15 Units Subcutaneous TID WC  . polyethylene glycol  17 g Oral Daily   Continuous Infusions: . sodium chloride 75 mL/hr at 05/14/17 2107     LOS: 3 days        Alwyn Ren, MD [facility]  If 7PM-7AM, please contact night-coverage [email or web address] Password [facility] 05/15/2017, 12:44 PM

## 2017-05-16 ENCOUNTER — Inpatient Hospital Stay (HOSPITAL_COMMUNITY): Payer: Medicare Other

## 2017-05-16 DIAGNOSIS — R4182 Altered mental status, unspecified: Secondary | ICD-10-CM

## 2017-05-16 LAB — BASIC METABOLIC PANEL
ANION GAP: 5 (ref 5–15)
Anion gap: 5 (ref 5–15)
BUN: 10 mg/dL (ref 6–20)
BUN: 11 mg/dL (ref 6–20)
CALCIUM: 9.4 mg/dL (ref 8.9–10.3)
CO2: 25 mmol/L (ref 22–32)
CO2: 22 mmol/L (ref 22–32)
CREATININE: 0.71 mg/dL (ref 0.44–1.00)
Calcium: 9.3 mg/dL (ref 8.9–10.3)
Chloride: 102 mmol/L (ref 101–111)
Chloride: 101 mmol/L (ref 101–111)
Creatinine, Ser: 0.74 mg/dL (ref 0.44–1.00)
GFR calc Af Amer: >60 mL/min (ref >60)
GFR calc Af Amer: >60 mL/min (ref >60)
GFR calc non Af Amer: >60 mL/min (ref >60)
GLUCOSE: 200 mg/dL — AB (ref 65–99)
GLUCOSE: 164 mg/dL — AB (ref 65–99)
Potassium: 4.8 mmol/L (ref 3.5–5.1)
Sodium: 132 mmol/L — ABNORMAL LOW (ref 135–145)
Sodium: 128 mmol/L — ABNORMAL LOW (ref 135–145)

## 2017-05-16 LAB — GLUCOSE, CAPILLARY
Glucose-Capillary: 172 mg/dL — ABNORMAL HIGH (ref 65–99)
Glucose-Capillary: 192 mg/dL — ABNORMAL HIGH (ref 65–99)
Glucose-Capillary: 208 mg/dL — ABNORMAL HIGH (ref 65–99)
Glucose-Capillary: 167 mg/dL — ABNORMAL HIGH (ref 65–99)
Glucose-Capillary: 191 mg/dL — ABNORMAL HIGH (ref 65–99)

## 2017-05-16 LAB — AMMONIA: AMMONIA: 39 umol/L — AB (ref 9–35)

## 2017-05-16 MED ORDER — INSULIN GLARGINE 100 UNIT/ML ~~LOC~~ SOLN
9.0000 [IU] | Freq: Every evening | SUBCUTANEOUS | Status: DC
  Administered 2017-05-16 – 2017-05-20 (×5): 9 [IU] via SUBCUTANEOUS
  Filled 2017-05-16 (×6): qty 0.09

## 2017-05-16 MED ORDER — SALINE SPRAY 0.65 % NA SOLN
1.0000 | NASAL | Status: DC | PRN
  Filled 2017-05-16: qty 44

## 2017-05-16 NOTE — Progress Notes (Signed)
PROGRESS NOTE    Megan [name]  ZOX:[id number] DOB: [birth year] DOA: 05/12/2017 PCP: [name], MD  83 y.o.femalewith medical history significant of severe dementia, diabetes, seizures and history of stroke with right hemiparesis presented to the emergency department after was brought by her family with confusion and not being herself. Patient not verbal my exam history provided by daughter. She reports that patient 4 days ago started being herself not talking much and urinating herself. She also reported decreased po intake. Daughter by the bed side,patient is awake.daughter reports patient better than yesterday but she is still concerned about the episodes she had yesterday when she was lethargic and was unable to wake her up.vss.  Patient was admitted with diagnosis of hyponatremia/metabolic encephalopathy.on the day of admission her sodium was 121 which is up to 132 with normal saline 75 mL an hour. She is daughter continue to be concerned about   patient not back to baseline.ocial and MRI of the brain was ordered which did not reveal any acute changes into a similar to the MRI she had in 2016. She also requested a vascular ultrasound of the  carotids artery. He denies being ordered today and needs to be followed up. She also requested a neurology consult Patient is a patient of Dr. Allena Katz. I have called in a consult to Dr. Amada Jupiter: See the patient today. Most likely plan would be to discharge her back home with her daughter Tuesday or Wednesday of this week.    Assessment & Plan:   Active Problems:   Hyponatremia Acute metabolicencephalopathy secondary to hyponatremia.sodium 131 today.continue slow iv hydration with NS.PLEASE dc iv FLUIDS TOMORROW. sHE HAS BEEN ON iv FLUIDS NORMAL SALINE AT 75 Ml AN HOUR SINCE ADMISSION. Hyponatremia due to decreased by mouth intake. Sodium improved after IV hydration with normal saline. Daughter reports that she is much better today than yesterday more  awake,but not quite back to baseline yet.CT head was negative for any acute changes. Chest x-ray was negative UA was negative follow-up checkBMP tomorrow.mri SHOWS NO ACUTE CHANGES.nOTED ULTRASOUND PENDING FOR TODAY.NEUROLOGY EVALUATION PENDING FOR TODAY.  Seizure disorder stable on Depakote.depakote level 58.  Type 2 diabetes restart Lantus.    DVT prophylaxis: lOVENOX Code Status: do not resuscitate Family Communication: DISCUSSED WITH DAUGHTER Disposition Plan: PLAN TO DISCHARGE PATIENT ON tUESDAY OR wEDNESDAY OF THIS WEEK AFTER CAROTID ULTRASOUND BACK AND AFTER SEEN BY NEUROLOGY.PATIENT LIVES AT HOME WITH HER DAUGHTER.   Consultants: NEUROLOGY  Procedures: NONE  Antimicrobials: NONE   Subjective: PATIENT IS AWAKE IN NO ACUTE DISTRESS.  Objective: Vitals:   05/15/17 2049 05/16/17 0522 05/16/17 0848 05/16/17 0900  BP: (!) 125/56 (!) 151/61 (!) 131/55 (!) 161/50  Pulse: 87 73 79 82  Resp: Temp: 98.2 F (36.8 C) (!) 97.5 F (36.4 C) (!) 97.5 F (36.4 C) 98.4 F (36.9 C)  TempSrc: Oral Oral Oral Oral  SpO2: 100% 99% 99% 100%  Weight: 76.7 kg (169 lb)     Height:        Intake/Output Summary (Last 24 hours) at 05/16/17 1519 Last data filed at 05/16/17 1320  Gross per 24 hour  Intake              720 ml  Output             1351 ml  Net             -631 ml   Filed Weights   05/12/17 2127 05/13/17 2109  05/15/17 2049  Weight: 76.4 kg (168 lb 6.4 oz) 76.4 kg (168 lb 6.3 oz) 76.7 kg (169 lb)    Examination:  General exam: Appears calm and comfortable  Respiratory system: Clear to auscultation. Respiratory effort normal. Cardiovascular system: S1 & S2 heard, RRR. No JVD, murmurs, rubs, gallops or clicks. No pedal edema. Gastrointestinal system: Abdomen is nondistended, soft and nontender. No organomegaly or masses felt. Normal bowel sounds heard. Central nervous system: Alert and oriented. No focal neurological deficits. Extremities:  contracted Skin: No rashes, lesions or ulcers Psychiatry: Judgement and insight appear normal. Mood & affect appropriate.     Data Reviewed: I have personally reviewed following labs and imaging studies  CBC:  Recent Labs Lab 05/12/17 1423 05/13/17 0559  WBC 7.6 6.0  HGB 10.7* 10.2*  HCT 32.0* 30.5*  MCV 86.0 86.2  PLT 229 237   Basic Metabolic Panel:  Recent Labs Lab 05/12/17 1940 05/13/17 0559 05/13/17 1231 05/15/17 0203 05/16/17 0744  NA 125* 127* 130* 131* 132*  K 5.2* 4.9 5.0 4.9 4.8  CL 95* 99* 102 104 102  CO2 20* GLUCOSE 139* 166* 182* 183* 200*  BUN 24* CREATININE 1.00 0.82 0.83 0.79 0.71  CALCIUM 9.2 8.9 9.0 8.9 9.4   GFR: Estimated Creatinine Clearance: 47.9 mL/min (by C-G formula based on SCr of 0.71 mg/dL). Liver Function Tests:  Recent Labs Lab 05/12/17 1423  AST 19  ALT 6*  ALKPHOS 76  BILITOT 0.5  PROT 7.0  ALBUMIN 2.8*   No results for input(s): LIPASE, AMYLASE in the last 168 hours.  Recent Labs Lab 05/16/17 1313  AMMONIA 39*   Coagulation Profile: No results for input(s): INR, PROTIME in the last 168 hours. Cardiac Enzymes: No results for input(s): CKTOTAL, CKMB, CKMBINDEX, TROPONINI in the last 168 hours. BNP (last 3 results) No results for input(s): PROBNP in the last 8760 hours. HbA1C: No results for input(s): HGBA1C in the last 72 hours. CBG:  Recent Labs Lab 05/15/17 1611 05/15/17 2037 05/16/17 0534 05/16/17 0843 05/16/17 1123  GLUCAP 172* 218* 172* 192* 208*   Lipid Profile: No results for input(s): CHOL, HDL, LDLCALC, TRIG, CHOLHDL, LDLDIRECT in the last 72 hours. Thyroid Function Tests: No results for input(s): TSH, T4TOTAL, FREET4, T3FREE, THYROIDAB in the last 72 hours. Anemia Panel: No results for input(s): VITAMINB12, FOLATE, FERRITIN, TIBC, IRON, RETICCTPCT in the last 72 hours. Sepsis Labs:  Recent Labs Lab 05/12/17 1750  LATICACIDVEN 1.57    No results found for this  or any previous visit (from the past 240 hour(s)).       Radiology Studies: Mr Laqueta Jean Wo Contrast  Result Date: 05/15/2017 CLINICAL DATA:  83 year old female with unexplained altered mental status. EXAM: MRI HEAD WITHOUT AND WITH CONTRAST TECHNIQUE: Multiplanar, multiecho pulse sequences of the brain and surrounding structures were obtained without and with intravenous contrast. CONTRAST:  <See Chart> MULTIHANCE GADOBENATE DIMEGLUMINE 529 MG/ML IV SOLN COMPARISON:  Head CTs 05/12/2017 and earlier. Brain MRI 10/23/2014, and earlier. FINDINGS: Study is intermittently degraded by motion artifact, despite administration of sedation and repeated imaging attempts. Brain: Multiple chronic lacunar infarcts in the midbrain and pons, bilateral deep gray matter nuclei, and bilateral corona radiata. Diffusion-weighted imaging is degraded today, no restricted diffusion identified to suggest acute infarction. Stable gray and white matter signal throughout the brain. Stable cerebral volume. No abnormal enhancement identified. No dural thickening. No midline shift, mass effect, evidence of mass lesion, ventriculomegaly,  extra-axial collection or acute intracranial hemorrhage. Cervicomedullary junction and pituitary are within normal limits. Vascular: Major intracranial vascular flow voids are stable since 2016. Dominant distal left vertebral artery. Skull and upper cervical spine: Negative. Normal bone marrow signal. Sinuses/Orbits: Stable and negative aside from mild chronic maxillary sinus mucosal thickening. Other: Visible internal auditory structures appear normal. Stable trace mastoid fluid. Small volume retained secretions in the left nasal cavity and the visible pharynx today. Scalp and face soft tissues appear negative. IMPRESSION: 1.  No acute intracranial abnormality. 2. Stable MRI appearance of the brain since 2016 with advanced chronic small vessel disease. Electronically Signed   By: Odessa Fleming M.D.   On:  05/15/2017 12:53        Scheduled Meds: . clopidogrel  75 mg Oral Q breakfast  . divalproex  500 mg Oral QHS  . enoxaparin (LOVENOX) injection  40 mg Subcutaneous Q24H  . insulin aspart  0-15 Units Subcutaneous TID WC  . polyethylene glycol  17 g Oral Daily   Continuous Infusions: . sodium chloride 75 mL/hr at 05/16/17 0458     LOS: 4 days     Alwyn Ren, MD [facility]  If 7PM-7AM, please contact night-coverage [email or web address] Password [facility] 05/16/2017, 3:19 PM

## 2017-05-16 NOTE — Procedures (Signed)
History: 83 year old female with altered mental status  Sedation: None  Technique: This is a 21 channel routine scalp EEG performed at the bedside with bipolar and monopolar montages arranged in accordance to the international 10/20 system of electrode placement. One channel was dedicated to EKG recording.    Background: The background consists of intermixed alpha and beta activities. There is a well defined posterior dominant rhythm of 8.5 Hz that attenuates with eye opening. Sleep is not recorded.  Photic stimulation: Physiologic driving is not performed  EEG Abnormalities: None  Clinical Interpretation: This normal EEG is recorded in the waking and drowsy state. There was no seizure or seizure predisposition recorded on this study. Please note that a normal EEG does not preclude the possibility of epilepsy.   Ritta Slot, MD [facility] [phone]  If 7pm- 7am, please page neurology on call as listed in AMION.

## 2017-05-16 NOTE — Consult Note (Signed)
NEURO HOSPITALIST CONSULT NOTE   Requestig physician: Dr. Jerolyn Center   Reason for Consult: resolving AMS after hyponatremia   History obtained from:  daughter  HPI:                                                                                                                                          Megan [name] is an 83 y.o. female medical history significant of severe dementia, diabetes, seizures and history of stroke with right hemiparesis presented to the emergency department after was brought by her family with confusion and not being herself. Daughter reported reports that patient 4 days ago started being herself not talking much and urinating herself. She also reported decreased oral intake over the past 48 hours. In the ED her NA was 21 with no UA abnormalities. Over the course of her hospitalization she improved with fluid and elevation of her Sodium. While patient was in the hospital daughter noted that she became slightly confused which most likely was secondary to some slight delirium. For this reason patient obtain an MRI but was given Ativan for this. After the MRI patient slept all day and apparently woke up in the middle the night and would not go back to sleep. At this point she's been sleeping during the day and intermittently waking up at night.  Talking the daughter, it should be known that patient has dementia which appears to be fairly significant. Patient oftentimes will forget where she is, family does all of her ADLs. Patient is non-mobile at baseline. Daughter has had conversations with Dr. Pearlean Brownie her neurologist as an outpatient about her decline in memory.  The daughter was concerned that due to some carotid stenosis that she had heard of. However looking back in the chart patient has no carotid stenosis on the right or the left. MRI was obtained during this hospitalization which did not show any hemorrhage, infarct or intracranial abnormalities. Currently  patient is extremely drowsy but awakens to noxious stimuli. She is able to count my fingers and follow commands when the daughter was present in her ear.  Past Medical History:  Diagnosis Date  . Diabetes mellitus   . Hyperlipidemia   . Seizure (HCC)   . Stroke [facility]) 2012   possible stroke Jan 2016    Past Surgical History:  Procedure Laterality Date  . CATARACT EXTRACTION      Family History  Problem Relation Age of Onset  . CAD Mother   . Hypertension Mother   . Diabetes Mother   . Stroke Mother      Social History:  reports that she has never smoked. She has quit using smokeless tobacco. Her smokeless tobacco use included Snuff. She reports that she does not drink alcohol or use drugs.  Allergies  Allergen Reactions  . Lipitor [Atorvastatin] Other (See Comments)    Myalgias     MEDICATIONS:                                                                                                                     Prior to Admission:  Prescriptions Prior to Admission  Medication Sig Dispense Refill Last Dose  . clopidogrel (PLAVIX) 75 MG tablet Take 1 tablet (75 mg total) by mouth daily with breakfast. 30 tablet 11 05/12/2017 at Unknown time  . divalproex (DEPAKOTE ER) 500 MG 24 hr tablet Take 1 tablet (500 mg total) by mouth at bedtime. 30 tablet 0 05/11/2017 at Unknown time  . furosemide (LASIX) 20 MG tablet Take 20 mg by mouth daily as needed.  1  at PRN  . guaiFENesin (MUCINEX) 600 MG 12 hr tablet Take 600 mg by mouth 2 (two) times daily as needed for to loosen phlegm.   05/12/2017 at Unknown time  . memantine (NAMENDA XR) 28 MG CP24 24 hr capsule Take 1 capsule (28 mg total) by mouth daily. 30 capsule 1 05/11/2017 at Unknown time  . NYSTATIN powder Apply 1 application topically 2 (two) times daily as needed for rash.  0 05/12/2017 at Unknown time  . pantoprazole (PROTONIX) 40 MG tablet Take 40 mg by mouth every morning.    05/12/2017 at Unknown time  . rosuvastatin (CRESTOR) 5 MG  tablet Take 5 mg by mouth every Monday, Wednesday, and Friday.   05/04/2017 at Unknown time  . TOUJEO SOLOSTAR 300 UNIT/ML SOPN Inject 9 Units as directed every evening.   0 05/11/2017 at Unknown time  . zinc oxide (BALMEX) 11.3 % CREA cream Apply 1 application topically daily.   05/12/2017 at Unknown time   Scheduled: . clopidogrel  75 mg Oral Q breakfast  . divalproex  500 mg Oral QHS  . enoxaparin (LOVENOX) injection  40 mg Subcutaneous Q24H  . insulin aspart  0-15 Units Subcutaneous TID WC  . polyethylene glycol  17 g Oral Daily     ROS:                                                                                                                                       History obtained from daughter  General ROS: negative for - chills, fatigue, fever, night sweats, weight gain or weight loss Psychological ROS: negative for - behavioral disorder,  hallucinations, memory difficulties, mood swings or suicidal ideation Ophthalmic ROS: negative for - blurry vision, double vision, eye pain or loss of vision ENT ROS: negative for - epistaxis, nasal discharge, oral lesions, sore throat, tinnitus or vertigo Allergy and Immunology ROS: negative for - hives or itchy/watery eyes Hematological and Lymphatic ROS: negative for - bleeding problems, bruising or swollen lymph nodes Endocrine ROS: negative for - galactorrhea, hair pattern changes, polydipsia/polyuria or temperature intolerance Respiratory ROS: negative for - cough, hemoptysis, shortness of breath or wheezing Cardiovascular ROS: negative for - chest pain, dyspnea on exertion, edema or irregular heartbeat Gastrointestinal ROS: negative for - abdominal pain, diarrhea, hematemesis, nausea/vomiting or stool incontinence Genito-Urinary ROS: negative for - dysuria, hematuria, incontinence or urinary frequency/urgency Musculoskeletal ROS: negative for - joint swelling or muscular weakness Neurological ROS: as noted in HPI Dermatological ROS:  negative for rash and skin lesion changes   Blood pressure (!) 161/50, pulse 82, temperature 98.4 F (36.9 C), temperature source Oral, resp. rate 15, height  (1.727 m), weight 76.7 kg (169 lb), SpO2 100 %.   Neurologic Examination:                                                                                                      HEENT-  Normocephalic, no lesions, without obvious abnormality.  Normal external eye and conjunctiva.  Normal TM's bilaterally.  Normal auditory canals and external ears. Normal external nose, mucus membranes and septum.  Normal pharynx. Cardiovascular- S1, S2 normal, pulses palpable throughout   Lungs- chest clear, no wheezing, rales, normal symmetric air entry Abdomen- normal findings: bowel sounds normal Extremities- no edema Lymph-no adenopathy palpable Musculoskeletal-no joint tenderness, deformity or swelling Skin-warm and dry, no hyperpigmentation, vitiligo, or suspicious lesions  Neurological Examination Mental Status: Patient currently is very drowsy but awakens easily. She is able to follow my fingers with each of her eyes and she is able to count my fingers. She does have a very low hypophonic weak voice. She appears frail. Cranial Nerves: II:  Visual fields grossly normal,  III,IV, VI: ptosis present in the right eye, extra-ocular motions intact bilaterally pupils equal, round, reactive to light and accommodation V,VII: Right facial droop which is old, facial light touch sensation normal bilaterally VIII: hearing decreased bilaterally IX,X: uvula rises symmetrically XII: midline tongue extension Motor: Patient is weak throughout however she is more weak on the right than the left secondary to her previous stroke. She is able to lift her left arm off the bed and able to lift her right forearm off the bed. Attempts were made to have her squeeze my hand however due to her drowsiness this was difficult. As stated she is wheelchair bound at  baseline. She has both of her ankles wrapped and does not appear that she has walked for quite a while Sensory: Pinprick light touch is intact throughout the left however decreased on the right Deep Tendon Reflexes: Depressed throughout Plantars: Mute bilaterally Cerebellar: Unable to obtain Gait: Unable to obtain      Lab Results: Basic Metabolic Panel:  Recent Labs Lab  05/12/17 1940 05/13/17 0559 05/13/17 1231 05/15/17 0203 05/16/17 0744  NA 125* 127* 130* 131* 132*  K 5.2* 4.9 5.0 4.9 4.8  CL 95* 99* 102 104 102  CO2 20* GLUCOSE 139* 166* 182* 183* 200*  BUN 24* CREATININE 1.00 0.82 0.83 0.79 0.71  CALCIUM 9.2 8.9 9.0 8.9 9.4    Liver Function Tests:  Recent Labs Lab 05/12/17 1423  AST 19  ALT 6*  ALKPHOS 76  BILITOT 0.5  PROT 7.0  ALBUMIN 2.8*   No results for input(s): LIPASE, AMYLASE in the last 168 hours. No results for input(s): AMMONIA in the last 168 hours.  CBC:  Recent Labs Lab 05/12/17 1423 05/13/17 0559  WBC 7.6 6.0  HGB 10.7* 10.2*  HCT 32.0* 30.5*  MCV 86.0 86.2  PLT 229 237    Cardiac Enzymes: No results for input(s): CKTOTAL, CKMB, CKMBINDEX, TROPONINI in the last 168 hours.  Lipid Panel: No results for input(s): CHOL, TRIG, HDL, CHOLHDL, VLDL, LDLCALC in the last 168 hours.  CBG:  Recent Labs Lab 05/15/17 1611 05/15/17 2037 05/16/17 0534 05/16/17 0843 05/16/17 1123  GLUCAP 172* 218* 172* 192* 208*    Microbiology: Results for orders placed or performed during the hospital encounter of 10/23/14  Urine culture     Status: None   Collection Time: 10/23/14  8:54 PM  Result Value Ref Range Status   Specimen Description URINE, CLEAN CATCH  Final   Special Requests NONE  Final   Colony Count   Final    >=100,000 COLONIES/ML Performed at [facility]    Culture   Final    Multiple bacterial morphotypes present, none predominant. Suggest appropriate recollection if clinically  indicated. Performed at [facility]    Report Status 10/25/2014 FINAL  Final    Coagulation Studies: No results for input(s): LABPROT, INR in the last 72 hours.  Imaging: Mr Laqueta Jean BJ Contrast  Result Date: 05/15/2017 CLINICAL DATA:  83 year old female with unexplained altered mental status. EXAM: MRI HEAD WITHOUT AND WITH CONTRAST TECHNIQUE: Multiplanar, multiecho pulse sequences of the brain and surrounding structures were obtained without and with intravenous contrast. CONTRAST:  <See Chart> MULTIHANCE GADOBENATE DIMEGLUMINE 529 MG/ML IV SOLN COMPARISON:  Head CTs 05/12/2017 and earlier. Brain MRI 10/23/2014, and earlier. FINDINGS: Study is intermittently degraded by motion artifact, despite administration of sedation and repeated imaging attempts. Brain: Multiple chronic lacunar infarcts in the midbrain and pons, bilateral deep gray matter nuclei, and bilateral corona radiata. Diffusion-weighted imaging is degraded today, no restricted diffusion identified to suggest acute infarction. Stable gray and white matter signal throughout the brain. Stable cerebral volume. No abnormal enhancement identified. No dural thickening. No midline shift, mass effect, evidence of mass lesion, ventriculomegaly, extra-axial collection or acute intracranial hemorrhage. Cervicomedullary junction and pituitary are within normal limits. Vascular: Major intracranial vascular flow voids are stable since 2016. Dominant distal left vertebral artery. Skull and upper cervical spine: Negative. Normal bone marrow signal. Sinuses/Orbits: Stable and negative aside from mild chronic maxillary sinus mucosal thickening. Other: Visible internal auditory structures appear normal. Stable trace mastoid fluid. Small volume retained secretions in the left nasal cavity and the visible pharynx today. Scalp and face soft tissues appear negative. IMPRESSION: 1.  No acute intracranial abnormality. 2. Stable MRI appearance of the brain  since 2016 with advanced chronic small vessel disease. Electronically Signed   By: Odessa Fleming M.D.   On: 05/15/2017 12:53  Assessment and plan per attending neurologist  Felicie Morn PA-C [facility] [phone]  05/16/2017, 11:52 AM  I have seen the patient and reviewed the above note.  Assessment/Plan: 83 year old female with altered mental status. When I initially saw the patient, I advised an EEG which was performed and negative at the time of formalizing this report. This time, I suspect that she has a multifactorial hypoactive delirium from hyponatremia, dementia, hospitalization. She appears to be improving and I would favor continued conservative management. With negative MRI and EEG, no signs of other etiologies for her altered mental status at this time. I do not think that the ammonia of 39 is significant.  1) EEG (already performed) 2) no further recommendations at this time, neurology will sign off, please call with further questions or concerns.  Ritta Slot, MD [facility] [phone]  If 7pm- 7am, please page neurology on call as listed in AMION.

## 2017-05-16 NOTE — Care Management Important Message (Signed)
Important Message  Patient Details  Name: Megan [name] MRN: [id number] Date of Birth: May 28, 1922   Medicare Important Message Given:  Yes    [name] 05/16/2017, 9:30 AM

## 2017-05-17 ENCOUNTER — Inpatient Hospital Stay (HOSPITAL_COMMUNITY): Payer: Medicare Other

## 2017-05-17 DIAGNOSIS — R531 Weakness: Secondary | ICD-10-CM

## 2017-05-17 DIAGNOSIS — G459 Transient cerebral ischemic attack, unspecified: Secondary | ICD-10-CM

## 2017-05-17 DIAGNOSIS — E871 Hypo-osmolality and hyponatremia: Principal | ICD-10-CM

## 2017-05-17 LAB — BASIC METABOLIC PANEL
Anion gap: 7 (ref 5–15)
BUN: 12 mg/dL (ref 6–20)
CO2: 22 mmol/L (ref 22–32)
Calcium: 9.1 mg/dL (ref 8.9–10.3)
Chloride: 103 mmol/L (ref 101–111)
Creatinine, Ser: 0.72 mg/dL (ref 0.44–1.00)
GFR calc Af Amer: >60 mL/min (ref >60)
GLUCOSE: 135 mg/dL — AB (ref 65–99)
POTASSIUM: 4.5 mmol/L (ref 3.5–5.1)
Sodium: 132 mmol/L — ABNORMAL LOW (ref 135–145)

## 2017-05-17 LAB — VAS US CAROTID
LCCAPDIAS: 11 cm/s
LCCAPSYS: 96 cm/s
LEFT ECA DIAS: -13 cm/s
LEFT VERTEBRAL DIAS: 11 cm/s
LICAPDIAS: -12 cm/s
Left CCA dist dias: -13 cm/s
Left CCA dist sys: -102 cm/s
Left ICA dist dias: -13 cm/s
Left ICA dist sys: -51 cm/s
Left ICA prox sys: -64 cm/s
RCCAPDIAS: 9 cm/s
RIGHT ECA DIAS: -4 cm/s
RIGHT VERTEBRAL DIAS: -2 cm/s
Right CCA prox sys: 78 cm/s
Right cca dist sys: -65 cm/s

## 2017-05-17 LAB — GLUCOSE, CAPILLARY
GLUCOSE-CAPILLARY: 262 mg/dL — AB (ref 65–99)
Glucose-Capillary: 114 mg/dL — ABNORMAL HIGH (ref 65–99)
Glucose-Capillary: 134 mg/dL — ABNORMAL HIGH (ref 65–99)
Glucose-Capillary: 221 mg/dL — ABNORMAL HIGH (ref 65–99)
Glucose-Capillary: 273 mg/dL — ABNORMAL HIGH (ref 65–99)

## 2017-05-17 MED ORDER — SODIUM CHLORIDE 1 G PO TABS
1.0000 g | ORAL_TABLET | Freq: Two times a day (BID) | ORAL | Status: AC
  Administered 2017-05-17 (×2): 1 g via ORAL
  Filled 2017-05-17 (×2): qty 1

## 2017-05-17 NOTE — Progress Notes (Signed)
PROGRESS NOTE    Megan [name]  ZOX:[id number] DOB: 1922/06/01 DOA: 05/12/2017 PCP: [name], MD  83 y.o.femalewith medical history significant of severe dementia, diabetes, seizures and history of stroke with right hemiparesis presented to the emergency department after was brought by her family with confusion and not being herself. Patient not verbal my exam history provided by daughter. She reports that patient 4 days ago started being herself not talking much and urinating herself. She also reported decreased po intake. Daughter by the bed side,patient is awake.daughter reports patient better than yesterday but she is still concerned about the episodes she had yesterday when she was lethargic and was unable to wake her up.vss.  Patient was admitted with diagnosis of hyponatremia/metabolic encephalopathy.on the day of admission her sodium was 121 which is up to 132 with normal saline 75 mL an hour. She is daughter continue to be concerned about   patient not back to baseline.ocial and MRI of the brain was ordered which did not reveal any acute changes into a similar to the MRI she had in 2016. She also requested a vascular ultrasound of the  carotids artery. He denies being ordered today and needs to be followed up. She also requested a neurology consult Patient is a patient of Dr. Allena Katz. I have called in a consult to Dr. Amada Jupiter: See the patient today. Most likely plan would be to discharge her back home with her daughter Tuesday or Wednesday of this week. The workup ordered by neurology was negative. This is explained to the daughter at length. Likely patient's new baseline. Plans to discharge tomorrow morning.    Assessment & Plan:   Active Problems:   Hyponatremia Acute metabolicencephalopathy secondary to hyponatremia.sodium 131 today. Will give salt tabs. Pt has received enough IVF. The head CT was negative for any acute changes. Chest x-ray was negative UA was negative follow-up  checkBMP tomorrow. MRI showed no acute changes. EEG showed no acute changes. Patient cleared by neurology who found no acute events or causes.  Hyponatremia due to decreased by mouth intake. Sodium improved after IV hydration with normal saline. Seizure disorder stable on Depakote.depakote level 58.  Type 2 diabetes Cont Lantus. SSI    DVT prophylaxis: lOVENOX Code Status: do not resuscitate Family Communication: DISCUSSED WITH DAUGHTER Disposition Plan: PLAN TO DISCHARGE PATIENT ON tUESDAY OR wEDNESDAY OF THIS WEEK AFTER CAROTID ULTRASOUND BACK AND AFTER SEEN BY NEUROLOGY.PATIENT LIVES AT HOME WITH HER DAUGHTER.   Consultants: NEUROLOGY  Procedures: NONE  Antimicrobials: NONE   Subjective: PATIENT IS AWAKE IN NO ACUTE DISTRESS. Dgtr at bedside.  Objective: Vitals:   05/16/17 1729 05/16/17 2120 05/17/17 0509 05/17/17 0904  BP: (!) 147/57 131/68 (!) 129/45 (!) 136/57  Pulse: 78 97 74 76  Resp: Temp: 97.7 F (36.5 C) 98.3 F (36.8 C) 98.5 F (36.9 C) 98.7 F (37.1 C)  TempSrc: Oral Oral Oral Oral  SpO2: 100% 100% 100% 100%  Weight:  76.7 kg (169 lb)    Height:        Intake/Output Summary (Last 24 hours) at 05/17/17 1656 Last data filed at 05/17/17 0600  Gross per 24 hour  Intake          1901.25 ml  Output             1200 ml  Net           701.25 ml   Filed Weights   05/13/17 2109 05/15/17 2049 05/16/17 2120  Weight: 76.4 kg (168 lb 6.3 oz) 76.7 kg (169 lb) 76.7 kg (169 lb)    Examination:  General exam: Appears calm and comfortable Grossly hard of hearing. Respiratory system: Clear to auscultation. Respiratory effort normal. Cardiovascular system: S1 & S2 heard, RRR. No JVD, murmurs, rubs, gallops or clicks. No pedal edema. Gastrointestinal system: Abdomen is nondistended, soft and nontender. No organomegaly or masses felt. Normal bowel sounds heard. Central nervous system: Alert and oriented. No focal neurological deficits. Extremities:  contracted Skin: No rashes, lesions or ulcers Psychiatry: Judgement and insight appear normal. Mood & affect appropriate.     Data Reviewed: I have personally reviewed following labs and imaging studies  CBC:  Recent Labs Lab 05/12/17 1423 05/13/17 0559  WBC 7.6 6.0  HGB 10.7* 10.2*  HCT 32.0* 30.5*  MCV 86.0 86.2  PLT 229 237   Basic Metabolic Panel:  Recent Labs Lab 05/13/17 1231 05/15/17 0203 05/16/17 0744 05/16/17 1629 05/17/17 0541  NA 130* 131* 132* 128* 132*  K 5.0 4.9 4.8 >7.5* 4.5  CL 102 104 102 101 103  CO2 GLUCOSE 182* 183* 200* 164* 135*  BUN CREATININE 0.83 0.79 0.71 0.74 0.72  CALCIUM 9.0 8.9 9.4 9.3 9.1   GFR: Estimated Creatinine Clearance: 47.9 mL/min (by C-G formula based on SCr of 0.72 mg/dL). Liver Function Tests:  Recent Labs Lab 05/12/17 1423  AST 19  ALT 6*  ALKPHOS 76  BILITOT 0.5  PROT 7.0  ALBUMIN 2.8*   No results for input(s): LIPASE, AMYLASE in the last 168 hours.  Recent Labs Lab 05/16/17 1313  AMMONIA 39*   Coagulation Profile: No results for input(s): INR, PROTIME in the last 168 hours. Cardiac Enzymes: No results for input(s): CKTOTAL, CKMB, CKMBINDEX, TROPONINI in the last 168 hours. BNP (last 3 results) No results for input(s): PROBNP in the last 8760 hours. HbA1C: No results for input(s): HGBA1C in the last 72 hours. CBG:  Recent Labs Lab 05/16/17 1123 05/16/17 1645 05/16/17 2113 05/17/17 0817 05/17/17 1240  GLUCAP 208* 167* 191* 114* 134*   Lipid Profile: No results for input(s): CHOL, HDL, LDLCALC, TRIG, CHOLHDL, LDLDIRECT in the last 72 hours. Thyroid Function Tests: No results for input(s): TSH, T4TOTAL, FREET4, T3FREE, THYROIDAB in the last 72 hours. Anemia Panel: No results for input(s): VITAMINB12, FOLATE, FERRITIN, TIBC, IRON, RETICCTPCT in the last 72 hours. Sepsis Labs:  Recent Labs Lab 05/12/17 1750  LATICACIDVEN 1.57    No results found for this  or any previous visit (from the past 240 hour(s)).       Radiology Studies: No results found.      Scheduled Meds: . clopidogrel  75 mg Oral Q breakfast  . divalproex  500 mg Oral QHS  . enoxaparin (LOVENOX) injection  40 mg Subcutaneous Q24H  . insulin aspart  0-15 Units Subcutaneous TID WC  . insulin glargine  9 Units Subcutaneous QPM  . polyethylene glycol  17 g Oral Daily  . sodium chloride  1 g Oral BID WC   Continuous Infusions:    LOS: 5 days     Haydee Salter, MD [facility]  If 7PM-7AM, please contact night-coverage [email or web address] Password [facility] 05/17/2017, 4:56 PM

## 2017-05-17 NOTE — Progress Notes (Signed)
*  PRELIMINARY RESULTS* Vascular Ultrasound Carotid Duplex has been completed.  Preliminary findings: Bilateral: No significant (1-39%) ICA stenosis. Antegrade vertebral flow.   Farrel Demark, RDMS, RVT  05/17/2017, 10:57 AM

## 2017-05-18 LAB — BASIC METABOLIC PANEL
Anion gap: 3 — ABNORMAL LOW (ref 5–15)
BUN: 11 mg/dL (ref 6–20)
CHLORIDE: 103 mmol/L (ref 101–111)
CO2: 25 mmol/L (ref 22–32)
CREATININE: 0.67 mg/dL (ref 0.44–1.00)
Calcium: 9.5 mg/dL (ref 8.9–10.3)
GFR calc non Af Amer: >60 mL/min (ref >60)
Glucose, Bld: 155 mg/dL — ABNORMAL HIGH (ref 65–99)
POTASSIUM: 4.8 mmol/L (ref 3.5–5.1)
Sodium: 131 mmol/L — ABNORMAL LOW (ref 135–145)

## 2017-05-18 LAB — GLUCOSE, CAPILLARY
GLUCOSE-CAPILLARY: 211 mg/dL — AB (ref 65–99)
GLUCOSE-CAPILLARY: 243 mg/dL — AB (ref 65–99)
Glucose-Capillary: 175 mg/dL — ABNORMAL HIGH (ref 65–99)
Glucose-Capillary: 272 mg/dL — ABNORMAL HIGH (ref 65–99)

## 2017-05-18 MED ORDER — SODIUM CHLORIDE 1 G PO TABS: 1.0000 g | ORAL_TABLET | Freq: Every day | ORAL | 0 refills | Status: AC

## 2017-05-18 MED ORDER — ENSURE ENLIVE PO LIQD
237.0000 mL | Freq: Every day | ORAL | Status: DC
  Administered 2017-05-19 – 2017-05-20 (×2): 237 mL via ORAL

## 2017-05-18 NOTE — Progress Notes (Signed)
Initial Nutrition Assessment  DOCUMENTATION CODES:   Not applicable  INTERVENTION:   -Ensure Enlive po daily, each supplement provides 350 kcal and 20 grams of protein  -Provided handout on "Sodium Content of Foods" from [employer] to pt's daughter  NUTRITION DIAGNOSIS:   Increased nutrient needs related to wound healing as evidenced by estimated needs.  GOAL:   Patient will meet greater than or equal to 90% of their needs  MONITOR:   PO intake, Supplement acceptance, Labs, Weight trends  REASON FOR ASSESSMENT:   Low Braden    ASSESSMENT:   83 yo female admitted with acute encephalopathy secondary to hyponatremia, ARF with mild hyperkalemia.  Pt with hx of severe dementia, DM, seizures, stroke with right hemiparesis  Recorded po intake 63% on average. Daughter at bedside and reports pt with very good appetite at home. Pt appears to consume very low sodium diet at home; daughter prepares all foods and uses fresh ingredients and does not use salt shaker. Daughter also buys low sodium, no added salt items when able.  Pt also takes Ensure/Boost at home  Noted pt receiving salt tablets for hyponatremia; daughter requesting information on how to increase sodium in pt's diet  Reports pt weight has been stable; no recent wt loss per weight encounters. Pt with weight gain since 2016  Labs: sodium 131, CBGs 114-273, Creatinine wdl Meds: ss novolog, lantus, sodium chloride tablet  Diet Order:  DIET DYS 3 Room service appropriate? Yes; Fluid consistency: Thin  Skin:  Wound (see comment) (stage II bilateral buttock pressure ulcers)  Last BM:  9/24  Height:   Ht Readings from Last 1 Encounters:  05/13/17  (1.727 m)    Weight:   Wt Readings from Last 1 Encounters:  05/17/17 169 lb 1.6 oz (76.7 kg)    BMI:  Body mass index is 25.71 kg/m.  Estimated Nutritional Needs:   Kcal:  1350-1500 kcals  Protein:  75-85 g  Fluid:  >/= 1.4  L  EDUCATION NEEDS:   Education needs addressed  Romelle Starcher MS, RD, LDN [phone] Pager  [phone] Weekend/On-Call Pager

## 2017-05-18 NOTE — Progress Notes (Signed)
Orthopedic Tech Progress Note Patient Details:  Megan [name] [birth year] [id number]  Patient ID: [name], female   DOB: [birth year], 83 y.o.   MRN: [id number]   [name] 05/18/2017, 11:48 AMBilateral unna boots

## 2017-05-18 NOTE — Progress Notes (Signed)
Inpatient Diabetes Program Recommendations  AACE/ADA: New Consensus Statement on Inpatient Glycemic Control (2015)  Target Ranges:  Prepandial:   less than 140 mg/dL      Peak postprandial:   less than 180 mg/dL (1-2 hours)      Critically ill patients:  140 - 180 mg/dL   Results for YANELLY, CANTRELLE (MRN [id number]) as of 05/18/2017 13:11  Ref. Range 05/17/2017 08:17 05/17/2017 12:40 05/17/2017 17:32 05/17/2017 21:44 05/17/2017 21:45 05/18/2017 07:46 05/18/2017 12:29  Glucose-Capillary Latest Ref Range: 65 - 99 mg/dL 474 (H) 259 (H) 563 (H) 273 (H) 262 (H) 175 (H) 272 (H)   Review of Glycemic Control  Diabetes history: DM2 Outpatient Diabetes medications: Toujeo 9 units QPM Current orders for Inpatient glycemic control: Lantus 9 units QHS, Novolog 0-15 units TID with meals  Inpatient Diabetes Program Recommendations: Correction (SSI): Please consider ordering Novolog 0-5 units QHS for bedtime correction. Insulin - Meal Coverage: Please consider ordering Novolog 3 units TID with meals for meal coverage if patient eats at least 50% of meals.  Thanks, Orlando Penner, RN, MSN, CDE Diabetes Coordinator Inpatient Diabetes Program [phone] (Team Pager from 8am to 5pm)

## 2017-05-18 NOTE — Discharge Summary (Signed)
Physician Discharge Summary  [name] ZOX:[id number] DOB: 1922/03/10 DOA: 05/12/2017  PCP: [name], MD  Admit date: 05/12/2017 Discharge date: 05/18/2017  Time spent: 45 minutes  Recommendations for Outpatient Follow-up:  1. Wound care clinic follow-up after discharge in next 10 days 2. Unna boots changed 3 times a week  3. D/C to [facility]  Discharge Diagnoses:  Principal Problem:   Hyponatremia Active Problems:   Acute encephalopathy   Seizure [facility])   Discharge Condition: stable  Diet recommendation: regular diet  Filed Weights   05/15/17 2049 05/16/17 2120 05/17/17 2156  Weight: 76.7 kg (169 lb) 76.7 kg (169 lb) 76.7 kg (169 lb 1.6 oz)    History of present illness: Megan [name] is a 83 y.o. female with medical history significant of severe dementia, diabetes, seizures and history of stroke with right hemiparesis presented to the emergency department after was brought by her family with confusion and not being herself. Patient not verbal my exam history provided by daughter. She reports that patient 4 days ago started being herself not talking much and urinating herself. She also reported decreased oral intake over the past 48 hours. Per daughter patient did not have any seizures, chest pain, nausea or vomiting and any falls   Hospital Course:  Patient presented with confusion and weakness. Found to have altered mental status thought to be due to low sodium. Neurology was consult it and they ordered an EEG and MRI both of which were negative. Was given gentle hydration with monitoring of sodium as it return to near-normal. Patient also had mild hyperkalemia and acute kidney injury which improved with fluids. Patient sodium climbed to about 132. Due to patient's duration on IV fluids and her ability to and frequent drinking. Stopped once she was better clinically. Monitored her for 2 days and she appeared well. Suspect patient's hyponatremia is chronic and  induced by diet restrictions imposed by her daughter. Plus affect from Depakote, which has had reported SIADH side effects. No plan to stop this medication since patient benefits from it. Nephrology saw patient and agreed. Less fluid intake and more solid food intake.  Notified by Case management and patient no longer inpatient criteria will need to be discharged. Discussed d/c with family. Family opted to keep patient in the hospital 1 more day while looking for facility.  On day of discharge early paronychia seen on great toe of left foot. Started on bacitracin.  Procedures:  none (i.e. Studies not automatically included, echos, thoracentesis, etc; not x-rays)  Consultations:  nephro  neuro  Discharge Exam: Vitals:   05/17/17 2210 05/18/17 0803  BP:  (!) 136/40  Pulse:  80  Resp:  18  Temp:  98.5 F (36.9 C)  SpO2: 100% 100%    General: NAD, grossly hard of hearing, appears calm and comfortable Cardiovascular: regular rate and rhythm, no murmurs rubs or gallops Respiratory: clear to auscultation bilaterally, normal work of breathing  Discharge Instructions Follow-up paronychia. Unna boots changed 3 times a week Use salt Tabs temporarily until patient begins to take more by mouth, recheck sodium in 3 days or if patient has change in mental status   Current Discharge Medication List    START taking these medications   Details  bacitracin ointment Apply to affected area daily Qty: 30 g, Refills: 0    feeding supplement, ENSURE ENLIVE, (ENSURE ENLIVE) LIQD Take 237 mLs by mouth 2 (two) times daily between meals. Qty: 237 mL, Refills: 12  sodium chloride 1 g tablet Take 1 tablet (1 g total) by mouth daily. Qty: 20 tablet, Refills: 0      CONTINUE these medications which have NOT CHANGED   Details  clopidogrel (PLAVIX) 75 MG tablet Take 1 tablet (75 mg total) by mouth daily with breakfast. Qty: 30 tablet, Refills: 11    divalproex (DEPAKOTE ER) 500 MG 24 hr  tablet Take 1 tablet (500 mg total) by mouth at bedtime. Qty: 30 tablet, Refills: 0   Associated Diagnoses: Partial epilepsy with impairment of consciousness, intractable (HCC)    guaiFENesin (MUCINEX) 600 MG 12 hr tablet Take 600 mg by mouth 2 (two) times daily as needed for to loosen phlegm.    memantine (NAMENDA XR) 28 MG CP24 24 hr capsule Take 1 capsule (28 mg total) by mouth daily. Qty: 30 capsule, Refills: 1    NYSTATIN powder Apply 1 application topically 2 (two) times daily as needed for rash. Refills: 0    pantoprazole (PROTONIX) 40 MG tablet Take 40 mg by mouth every morning.     rosuvastatin (CRESTOR) 5 MG tablet Take 5 mg by mouth every Monday, Wednesday, and Friday.    TOUJEO SOLOSTAR 300 UNIT/ML SOPN Inject 9 Units as directed every evening.  Refills: 0    zinc oxide (BALMEX) 11.3 % CREA cream Apply 1 application topically daily.      STOP taking these medications     furosemide (LASIX) 20 MG tablet        Allergies  Allergen Reactions  . Lipitor [Atorvastatin] Other (See Comments)    Myalgias    Follow-up Information    [name], MD.   Specialty:  Internal Medicine Contact information: [address] [place] [address] [phone]            The results of significant diagnostics from this hospitalization (including imaging, microbiology, ancillary and laboratory) are listed below for reference.    Significant Diagnostic Studies: Ct Head Wo Contrast  Result Date: 05/12/2017 CLINICAL DATA:  Altered level of consciousness. EXAM: CT HEAD WITHOUT CONTRAST TECHNIQUE: Contiguous axial images were obtained from the base of the skull through the vertex without intravenous contrast. COMPARISON:  Head CT dated 05/10/2016. FINDINGS: Brain: Generalized age related parenchymal atrophy with commensurate dilatation of the ventricles and sulci. Chronic small vessel ischemic changes within the bilateral periventricular and subcortical white  matter. Small old lacunar infarcts within the bilateral basal ganglia. No mass, hemorrhage, edema or other evidence of acute parenchymal abnormality. No extra-axial hemorrhage. Vascular: There are chronic calcified atherosclerotic changes of the large vessels at the skull base. No unexpected hyperdense vessel. Skull: Normal. Negative for fracture or focal lesion. Sinuses/Orbits: Characterization limited by patient motion artifact, but no gross abnormality. Other: None. IMPRESSION: 1. No acute findings.  No intracranial mass, hemorrhage or edema. 2. Atrophy and chronic ischemic changes, as detailed above. Electronically Signed   By: Bary Richard M.D.   On: 05/12/2017 16:36   Mr Laqueta Jean BJ Contrast  Result Date: 05/15/2017 CLINICAL DATA:  83 year old female with unexplained altered mental status. EXAM: MRI HEAD WITHOUT AND WITH CONTRAST TECHNIQUE: Multiplanar, multiecho pulse sequences of the brain and surrounding structures were obtained without and with intravenous contrast. CONTRAST:  <See Chart> MULTIHANCE GADOBENATE DIMEGLUMINE 529 MG/ML IV SOLN COMPARISON:  Head CTs 05/12/2017 and earlier. Brain MRI 10/23/2014, and earlier. FINDINGS: Study is intermittently degraded by motion artifact, despite administration of sedation and repeated imaging attempts. Brain: Multiple chronic lacunar infarcts in the  midbrain and pons, bilateral deep gray matter nuclei, and bilateral corona radiata. Diffusion-weighted imaging is degraded today, no restricted diffusion identified to suggest acute infarction. Stable gray and white matter signal throughout the brain. Stable cerebral volume. No abnormal enhancement identified. No dural thickening. No midline shift, mass effect, evidence of mass lesion, ventriculomegaly, extra-axial collection or acute intracranial hemorrhage. Cervicomedullary junction and pituitary are within normal limits. Vascular: Major intracranial vascular flow voids are stable since 2016. Dominant distal  left vertebral artery. Skull and upper cervical spine: Negative. Normal bone marrow signal. Sinuses/Orbits: Stable and negative aside from mild chronic maxillary sinus mucosal thickening. Other: Visible internal auditory structures appear normal. Stable trace mastoid fluid. Small volume retained secretions in the left nasal cavity and the visible pharynx today. Scalp and face soft tissues appear negative. IMPRESSION: 1.  No acute intracranial abnormality. 2. Stable MRI appearance of the brain since 2016 with advanced chronic small vessel disease. Electronically Signed   By: Odessa Fleming M.D.   On: 05/15/2017 12:53   Dg Chest Portable 1 View  Result Date: 05/12/2017 CLINICAL DATA:  Altered mental status EXAM: PORTABLE CHEST 1 VIEW COMPARISON:  Chest x-ray dated 04/24/2014. FINDINGS: Heart size and mediastinal contours are stable. Atherosclerotic changes again noted at the aortic arch. Study is hypoinspiratory with crowding of the perihilar and bibasilar bronchovascular markings. Given these low lung volumes, lungs appear clear, possible mild bibasilar fibrosis or bronchiectasis. No pleural effusion or pneumothorax seen. No acute or suspicious osseous finding. IMPRESSION: 1. No active disease.  No evidence of pneumonia or pulmonary edema. 2. Possible mild bibasilar fibrosis and/or bronchiectasis. 3. Aortic atherosclerosis. Electronically Signed   By: Bary Richard M.D.   On: 05/12/2017 17:26    Microbiology: No results found for this or any previous visit (from the past 240 hour(s)).   Labs: Basic Metabolic Panel:  Recent Labs Lab 05/15/17 0203 05/16/17 0744 05/16/17 1629 05/17/17 0541 05/18/17 0914  NA 131* 132* 128* 132* 131*  K 4.9 4.8 >7.5* 4.5 4.8  CL 104 102 101 103 103  CO2 GLUCOSE 183* 200* 164* 135* 155*  BUN CREATININE 0.79 0.71 0.74 0.72 0.67  CALCIUM 8.9 9.4 9.3 9.1 9.5   Liver Function Tests:  Recent Labs Lab 05/12/17 1423  AST 19  ALT 6*   ALKPHOS 76  BILITOT 0.5  PROT 7.0  ALBUMIN 2.8*   No results for input(s): LIPASE, AMYLASE in the last 168 hours.  Recent Labs Lab 05/16/17 1313  AMMONIA 39*   CBC:  Recent Labs Lab 05/12/17 1423 05/13/17 0559  WBC 7.6 6.0  HGB 10.7* 10.2*  HCT 32.0* 30.5*  MCV 86.0 86.2  PLT 229 237   Cardiac Enzymes: No results for input(s): CKTOTAL, CKMB, CKMBINDEX, TROPONINI in the last 168 hours. BNP: BNP (last 3 results) No results for input(s): BNP in the last 8760 hours.  ProBNP (last 3 results) No results for input(s): PROBNP in the last 8760 hours.  CBG:  Recent Labs Lab 05/17/17 1732 05/17/17 2144 05/17/17 2145 05/18/17 0746 05/18/17 1229  GLUCAP 221* 273* 262* 175* 272*       Signed:  Haydee Salter MD  FACP  [facility] 05/18/2017, 5:03 PM

## 2017-05-18 NOTE — Discharge Instructions (Signed)
Hyponatremia Hyponatremia is when the amount of salt (sodium) in your blood is too low. When salt levels are low, your cells absorb extra water and they swell. The swelling happens throughout the body, but it mostly affects the brain. Follow these instructions at home:  Take medicines only as told by your doctor. Many medicines can make this condition worse. Talk with your doctor about any medicines that you are currently taking.  Carefully follow a recommended diet as told by your doctor.  Carefully follow instructions from your doctor about fluid restrictions.  Keep all follow-up visits as told by your doctor. This is important.  Do not drink alcohol. Contact a doctor if:  You feel sicker to your stomach (nauseous).  You feel more confused.  You feel more tired (fatigued).  Your headache gets worse.  You feel weaker.  Your symptoms go away and then they come back.  You have trouble following the diet instructions. Get help right away if:  You start to twitch and shake (have a seizure).  You pass out (faint).  You keep having watery poop (diarrhea).  You keep throwing up (vomiting). This information is not intended to replace advice given to you by your health care provider. Make sure you discuss any questions you have with your health care provider. Document Released: 04/21/2011 Document Revised: 01/15/2016 Document Reviewed: 08/05/2014 Elsevier Interactive Patient Education  2018 [employer].   Confusion Confusion is the inability to think with your usual speed or clarity. Confusion may come on quickly or slowly over time. How quickly the confusion comes on depends on the cause. Confusion can be due to any number of causes. What are the causes?  Concussion, head injury, or head trauma.  Seizures.  Stroke.  Fever.  Brain tumor.  Age related decreased brain function (dementia).  Heightened emotional states like rage or terror.  Mental illness in which  the person loses the ability to determine what is real and what is not (hallucinations).  Infections such as a urinary tract infection (UTI).  Toxic effects from alcohol, drugs, or prescription medicines.  Dehydration and an imbalance of salts in the body (electrolytes).  Lack of sleep.  Low blood sugar (diabetes).  Low levels of oxygen from conditions such as chronic lung disorders.  Drug interactions or other medicine side effects.  Nutritional deficiencies, especially niacin, thiamine, vitamin C, or vitamin B.  Sudden drop in body temperature (hypothermia).  Change in routine, such as when traveling or hospitalized. What are the signs or symptoms? People often describe their thinking as cloudy or unclear when they are confused. Confusion can also include feeling disoriented. That means you are unaware of where or who you are. You may also not know what the date or time is. If confused, you may also have difficulty paying attention, remembering, and making decisions. Some people also act aggressively when they are confused. How is this diagnosed? The medical evaluation of confusion may include:  Blood and urine tests.  X-rays.  Brain and nervous system tests.  Analyzing your brain waves (electroencephalogram or EEG).  Magnetic resonance imaging (MRI) of your head.  Computed tomography (CT) scan of your head.  Mental status tests in which your health care provider may ask many questions. Some of these questions may seem silly or strange, but they are a very important test to help diagnose and treat confusion.  How is this treated? An admission to the hospital may not be needed, but a person with confusion should not  be left alone. Stay with a family member or friend until the confusion clears. Avoid alcohol, pain relievers, or sedative drugs until you have fully recovered. Do not drive until directed by your health care provider. Follow these instructions at home: What  family and friends can do:  To find out if someone is confused, ask the person to state his or her name, age, and the date. If the person is unsure or answers incorrectly, he or she is confused.  Always introduce yourself, no matter how well the person knows you.  Often remind the person of his or her location.  Place a calendar and clock near the confused person.  Help the person with his or her medicines. You may want to use a pill box, an alarm as a reminder, or give the person each dose as prescribed.  Talk about current events and plans for the day.  Try to keep the environment calm, quiet, and peaceful.  Make sure the person keeps follow-up visits with his or her health care provider.  How is this prevented? Ways to prevent confusion:  Avoid alcohol.  Eat a balanced diet.  Get enough sleep.  Take medicine only as directed by your health care provider.  Do not become isolated. Spend time with other people and make plans for your days.  Keep careful watch on your blood sugar levels if you are diabetic.  Get help right away if:  You develop severe headaches, repeated vomiting, seizures, blackouts, or slurred speech.  There is increasing confusion, weakness, numbness, restlessness, or personality changes.  You develop a loss of balance, have marked dizziness, feel uncoordinated, or fall.  You have delusions, hallucinations, or develop severe anxiety.  Your family members think you need to be rechecked. This information is not intended to replace advice given to you by your health care provider. Make sure you discuss any questions you have with your health care provider. Document Released: 09/16/2004 Document Revised: 02/27/2016 Document Reviewed: 09/14/2013 Elsevier Interactive Patient Education  [employer].

## 2017-05-18 NOTE — Consult Note (Addendum)
WOC Nurse wound consult note Reason for Consult: Consult requested for bilat Una boots.  Family member at the bedside states pt was wearing Una boots prior to admission which were changed 2X week by home health and current compression wraps have been in place since last Thursday. Wound type: Removed previous Una boots.  There are no open wounds or drainage, legs and feet with generalized edema.  Wraps are in place to control edema, according to family member. Dressing procedure/placement/frequency: Paged ortho tech to apply bilat [employer] and coban and change Q Wed and Mon.  Pt can resume home health schedule for compression wrap changes after discharge. Please re-consult if further assistance is needed.  Thank-you,  Cammie Mcgee MSN, RN, CWOCN, [place], CNS [phone]

## 2017-05-19 LAB — BASIC METABOLIC PANEL
Anion gap: 5 (ref 5–15)
BUN: 11 mg/dL (ref 6–20)
CALCIUM: 9.2 mg/dL (ref 8.9–10.3)
CO2: 26 mmol/L (ref 22–32)
Chloride: 98 mmol/L — ABNORMAL LOW (ref 101–111)
Creatinine, Ser: 0.71 mg/dL (ref 0.44–1.00)
GFR calc Af Amer: >60 mL/min (ref >60)
GLUCOSE: 138 mg/dL — AB (ref 65–99)
Potassium: 4.7 mmol/L (ref 3.5–5.1)
Sodium: 129 mmol/L — ABNORMAL LOW (ref 135–145)

## 2017-05-19 LAB — GLUCOSE, CAPILLARY
GLUCOSE-CAPILLARY: 194 mg/dL — AB (ref 65–99)
Glucose-Capillary: 136 mg/dL — ABNORMAL HIGH (ref 65–99)
Glucose-Capillary: 284 mg/dL — ABNORMAL HIGH (ref 65–99)
Glucose-Capillary: 286 mg/dL — ABNORMAL HIGH (ref 65–99)

## 2017-05-19 NOTE — Care Management Note (Signed)
Case Management Note  Patient Details  Name: Megan [name] MRN: [id number] Date of Birth: April 29, 1922  Subjective/Objective:    CM following for progression and d/c planning.                Action/Plan: 05/19/2017 Met with pt daughter per her request this am and she is now concerned about her ability to care for her mother at home. Currently await PT/OT eval. Pt daughter states that prior to this hospitalization she was able to get the pt to stand and assist in pivoting into a chair. She is interested in SNF if she is able to rehab or to consider home hospice services. PT eval now recommending SNF, CSW Caryl Pina notified and working with pt daugher re placement.   Expected Discharge Date:  05/20/2017              Expected Discharge Plan:  [place]  In-House Referral:  NA  Discharge planning Services  CM Consult  Post Acute Care Choice:  Home Health Choice offered to:  Adult Children  DME Arranged:  N/A DME Agency:     HH Arranged:  RN, PT [place] Agency:  Other - See comment (Pt active with [facility])  Status of Service:  In process, will continue to follow  If discussed at Long Length of Stay Meetings, dates discussed:    Additional Comments:  Adron Bene, RN 05/19/2017, 12:49 PM

## 2017-05-19 NOTE — Consult Note (Signed)
WOC consult requested for compression wraps to BLE.  This was performed yesterday; please refer to previous progress note for assessment and plan of care.  Ortho tech applied [employer] yesterday. Please re-consult if further assistance is needed.  Thank-you,  Cammie Mcgee MSN, RN, CWOCN, [place], CNS [phone]

## 2017-05-19 NOTE — Evaluation (Addendum)
Physical Therapy Evaluation Patient Details Name: Megan [name] MRN: [id number] DOB: [birth year] Today's Date: 05/19/2017   History of Present Illness  Pt is a 83 y/o female admitted secondary to AMS and hyponatremia. CT and MRI negative for acute abnormality. PMH includes DM, Seizures, CVA, and HTN.   Clinical Impression  Pt admitted secondary to problem above with deficits below. PTA, pt's daughter assist pt with stand pivot transfers to [facility] and to [facility] Upon eval, pt presenting with decreased cognition, balance deficits and weakness. Required total assist +2 for mobility tasks this session. Pt's daughter reported concerns about taking pt at home, as pt was able to assist prior to admission. Recommending SNF at d/c to increase strength and ability to assist with functional mobility at home. Pt's daughter also expressing concern about difficulty swallowing, so recommending SLP consult as well. Will continue to follow acutely to maximize functional mobility independence and safety.     Follow Up Recommendations SNF;Supervision/Assistance - 24 hour    Equipment Recommendations  Other (comment) (hoyer lift and lift sling )    Recommendations for Other Services Speech consult     Precautions / Restrictions Precautions Precautions: Fall Restrictions Weight Bearing Restrictions: No      Mobility  Bed Mobility Overal bed mobility: Needs Assistance Bed Mobility: Supine to Sit;Sit to Supine     Supine to sit: Total assist;+2 for physical assistance Sit to supine: Total assist;+2 for physical assistance   General bed mobility comments: Total A +2 for basic bed mobility. Unable to follow verbal commands to assist. Pt's daughter reports pt was able to assist prior to coming into hospital.   Transfers Overall transfer level: Needs assistance Equipment used: None Transfers: Sit to/from Stand Sit to Stand: Total assist         General transfer comment: Sit<>stand X 3. Required total assist  to stand. Unable to attempt stand pivot to chair secondary to decrease safety.   Ambulation/Gait             General Gait Details: Uses WC at baseline.   Stairs            Wheelchair Mobility    Modified Rankin (Stroke Patients Only)       Balance Overall balance assessment: Needs assistance Sitting-balance support: Bilateral upper extremity supported;Feet supported Sitting balance-Leahy Scale: Poor Sitting balance - Comments: Reliant on at least min A to maintain balance. Pt's daughter reports previously, pt able to maintain balance at EOB.    Standing balance support: Bilateral upper extremity supported Standing balance-Leahy Scale: Zero Standing balance comment: Total assist to stand and maintain standing.                              Pertinent Vitals/Pain Pain Assessment: Faces Faces Pain Scale: No hurt    Home Living Family/patient expects to be discharged to:: Private residence Living Arrangements: Children Available Help at Discharge: Family;Available 24 hours/day Type of Home: House Home Access: Level entry     Home Layout: One level Home Equipment: Bedside commode;Wheelchair - [employer] - 2 wheels;Cane - single point      Prior Function Level of Independence: Needs assistance   Gait / Transfers Assistance Needed: Daughter assists with transfers to [facility] and to [facility]. Per daughter, pt was able to assist.    ADL's / Homemaking Assistance Needed: Daughter helps give her bath in the bed.         Hand Dominance  Dominant Hand: Right    Extremity/Trunk Assessment   Upper Extremity Assessment Upper Extremity Assessment: RUE deficits/detail;Generalized weakness RUE Deficits / Details: RUE deficits at baseline     Lower Extremity Assessment Lower Extremity Assessment: RLE deficits/detail;Generalized weakness RLE Deficits / Details: RLE with weakness at baseline secondary to CVA.     Cervical / Trunk Assessment Cervical / Trunk  Assessment: Kyphotic  Communication   Communication: Expressive difficulties;HOH  Cognition Arousal/Alertness: Awake/alert Behavior During Therapy: WFL for tasks assessed/performed Overall Cognitive Status: History of cognitive impairments - at baseline                                 General Comments: Pt non verbal during session. Better response to daughter as she would respond with head shaking yes or no to daughters questions.       General Comments General comments (skin integrity, edema, etc.): Pt's daughter very concerned about taking pt home with pt not being able to assist as much as previously. Educated about SNF at pt and daughter agreeable.     Exercises     Assessment/Plan    PT Assessment Patient needs continued PT services  PT Problem List Decreased strength;Decreased balance;Decreased mobility;Decreased cognition;Decreased knowledge of use of DME;Decreased knowledge of precautions;Decreased safety awareness       PT Treatment Interventions Functional mobility training;Therapeutic activities;Therapeutic exercise;Balance training;Neuromuscular re-education;Cognitive remediation;Patient/family education;Wheelchair mobility training    PT Goals (Current goals can be found in the Care Plan section)  Acute Rehab PT Goals Patient Stated Goal: None stated  PT Goal Formulation: With family Time For Goal Achievement: 06/02/17 Potential to Achieve Goals: Fair    Frequency Min 2X/week   Barriers to discharge        Co-evaluation               AM-PAC PT "6 Clicks" Daily Activity  Outcome Measure Difficulty turning over in bed (including adjusting bedclothes, sheets and blankets)?: Unable Difficulty moving from lying on back to sitting on the side of the bed? : Unable Difficulty sitting down on and standing up from a chair with arms (e.g., wheelchair, bedside commode, etc,.)?: Unable Help needed moving to and from a bed to chair (including a  wheelchair)?: Total Help needed walking in hospital room?: Total Help needed climbing 3-5 steps with a railing? : Total 6 Click Score: 6    End of Session Equipment Utilized During Treatment: Gait belt Activity Tolerance: Patient tolerated treatment well Patient left: in bed;with call bell/phone within reach;with family/visitor present Nurse Communication: Mobility status PT Visit Diagnosis: Muscle weakness (generalized) (M62.81);Difficulty in walking, not elsewhere classified (R26.2)    Time: 9604-5409 PT Time Calculation (min) (ACUTE ONLY): 25 min   Charges:   PT Evaluation $PT Eval Moderate Complexity: 1 Mod PT Treatments $Therapeutic Activity: 8-22 mins   PT G Codes:        Gladys Damme, PT, DPT  Acute Rehabilitation Services  Pager: [phone]   Lehman Prom 05/19/2017, 12:04 PM

## 2017-05-19 NOTE — Clinical Social Work Note (Signed)
Clinical Social Work Assessment  Patient Details  Name: Megan [name] MRN: [id number] Date of Birth: [birth year]  Date of referral:  05/19/17               Reason for consult:  Discharge Planning, Facility Placement                Permission sought to share information with:  Family Supports Permission granted to share information::  Yes, Verbal Permission Granted  Name::     Renleigh Ouellet  Agency::  snf  Relationship::  daughter  Contact Information:  [phone]  Housing/Transportation Living arrangements for the past 2 months:  Single Family Home Source of Information:  Adult Children Patient Interpreter Needed:  None Criminal Activity/Legal Involvement Pertinent to Current Situation/Hospitalization:  No - Comment as needed Significant Relationships:  Adult Children Lives with:  Adult Children Do you feel safe going back to the place where you live?  Yes Need for family participation in patient care:  Yes (Comment)  Care giving concerns: Patient lives with daughter and has support from family   Social Worker assessment / plan:  Visual merchandiser spoke with patients daughter Dondra Spry via phone. Dondra Spry stated patient lives at home with her and she assist patient with everyday needs. Dondra Spry stated she is agreeable for patient to discharge to SNF for short term rehab. Dondra Spry stated she would prefer [facility] since she has volunteered  at the facility in the past. CSW to follow up with patient/family with bed offers.  Employment status:  Retired Database administrator PT Recommendations:  Skilled Nursing Facility Information / Referral to community resources:  Skilled Nursing Facility  Patient/Family's Response to care:  Dondra Spry stated appreciation for CSW role in patients care.  Patient/Family's Understanding of and Emotional Response to Diagnosis, Current Treatment, and Prognosis: Family has understanding of patients current hospitalization   Emotional  Assessment Appearance:  Appears stated age Attitude/Demeanor/Rapport:  Unable to Assess Affect (typically observed):  Unable to Assess Orientation:   (per chart pt responds to most commands) Alcohol / Substance use:  Not Applicable Psych involvement (Current and /or in the community):  No (Comment)  Discharge Needs  Concerns to be addressed:  No discharge needs identified Readmission within the last 30 days:  No Current discharge risk:  None Barriers to Discharge:  No Barriers Identified   Althea Charon, LCSW 05/19/2017, 12:14 PM

## 2017-05-19 NOTE — Progress Notes (Signed)
PROGRESS NOTE    Megan [name]  ONG:[id number] DOB: [birth year] DOA: 05/12/2017 PCP: [name], MD  83 y.o.femalewith medical history significant of severe dementia, diabetes, seizures and history of stroke with right hemiparesis presented to the emergency department after was brought by her family with confusion and not being herself. Patient not verbal my exam history provided by daughter. She reports that patient 4 days ago started being herself not talking much and urinating herself. She also reported decreased po intake. Daughter by the bed side,patient is awake.daughter reports patient better than yesterday but she is still concerned about the episodes she had yesterday when she was lethargic and was unable to wake her up.vss.  Patient was admitted with diagnosis of hyponatremia/metabolic encephalopathy.on the day of admission her sodium was 121 which is up to 132 with normal saline 75 mL an hour. She is daughter continue to be concerned about   patient not back to baseline.ocial and MRI of the brain was ordered which did not reveal any acute changes into a similar to the MRI she had in 2016. She also requested a vascular ultrasound of the  carotids artery. He denies being ordered today and needs to be followed up. She also requested a neurology consult Patient is a patient of Dr. Allena Katz. I have called in a consult to Dr. Amada Jupiter: See the patient today. Most likely plan would be to discharge her back home with her daughter Tuesday or Wednesday of this week. The workup ordered by neurology was negative. This is explained to the daughter at length. Likely patient's new baseline. Plans to discharge tomorrow morning.    Assessment & Plan:   Active Problems:   Hyponatremia Acute metabolicencephalopathy secondary to hyponatremia, will monitor na. Pt has received enough IVF. The head CT was negative for any acute changes. Chest x-ray was negative UA was negative follow-up checkBMP tomorrow.  MRI showed no acute changes. EEG showed no acute changes. Patient cleared by neurology who found no acute events or causes.  Hyponatremia due to decreased by mouth intake. Sodium improved after IV hydration with normal saline. Seizure disorder stable on Depakote.depakote level 58.  Type 2 diabetes Cont Lantus. SSI    DVT prophylaxis: LOVENOX Code Status: do not resuscitate Family Communication: DISCUSSED WITH DAUGHTER Disposition Plan: PLAN TO DISCHARGE PATIENT ON Friday or Monday OF THIS WEEK AFTER SNF placement found.   Consultants: NEUROLOGY  Procedures: NONE  Antimicrobials: NONE   Subjective: PATIENT IS AWAKE IN NO ACUTE DISTRESS. Dgtr at bedside. No issues overnight per nursing.  Objective: Vitals:   05/18/17 0803 05/18/17 2200 05/19/17 0610 05/19/17 1000  BP: (!) 136/40 139/60 (!) 138/56 (!) 156/62  Pulse: 80 83 83 77  Resp: Temp: 98.5 F (36.9 C) 97.6 F (36.4 C) 98.4 F (36.9 C) 98.6 F (37 C)  TempSrc: Oral Oral Axillary Oral  SpO2: 100% 100% 100% 100%  Weight:  76.7 kg (169 lb)    Height:        Intake/Output Summary (Last 24 hours) at 05/19/17 1539 Last data filed at 05/19/17 1300  Gross per 24 hour  Intake              240 ml  Output             1175 ml  Net             -935 ml   Filed Weights   05/16/17 2120 05/17/17 2156 05/18/17 2200  Weight: 76.7 kg (  169 lb) 76.7 kg (169 lb 1.6 oz) 76.7 kg (169 lb)    Examination:  General exam: Appears calm and comfortable Grossly hard of hearing. Respiratory system: Clear to auscultation. Respiratory effort normal. Cardiovascular system: S1 & S2 heard, RRR. No JVD, murmurs, rubs, gallops or clicks. No pedal edema. Gastrointestinal system: Abdomen is nondistended, soft and nontender. No organomegaly or masses felt. Normal bowel sounds heard. Central nervous system: Alert and oriented. No focal neurological deficits. Extremities: contracted Skin: No rashes, lesions or ulcers Psychiatry:  Judgement and insight appear normal. Mood & affect appropriate.     Data Reviewed: I have personally reviewed following labs and imaging studies  CBC:  Recent Labs Lab 05/13/17 0559  WBC 6.0  HGB 10.2*  HCT 30.5*  MCV 86.2  PLT 237   Basic Metabolic Panel:  Recent Labs Lab 05/16/17 0744 05/16/17 1629 05/17/17 0541 05/18/17 0914 05/19/17 0648  NA 132* 128* 132* 131* 129*  K 4.8 >7.5* 4.5 4.8 4.7  CL 102 101 103 103 98*  CO2 GLUCOSE 200* 164* 135* 155* 138*  BUN CREATININE 0.71 0.74 0.72 0.67 0.71  CALCIUM 9.4 9.3 9.1 9.5 9.2   GFR: Estimated Creatinine Clearance: 47.9 mL/min (by C-G formula based on SCr of 0.71 mg/dL). Liver Function Tests: No results for input(s): AST, ALT, ALKPHOS, BILITOT, PROT, ALBUMIN in the last 168 hours. No results for input(s): LIPASE, AMYLASE in the last 168 hours.  Recent Labs Lab 05/16/17 1313  AMMONIA 39*   Coagulation Profile: No results for input(s): INR, PROTIME in the last 168 hours. Cardiac Enzymes: No results for input(s): CKTOTAL, CKMB, CKMBINDEX, TROPONINI in the last 168 hours. BNP (last 3 results) No results for input(s): PROBNP in the last 8760 hours. HbA1C: No results for input(s): HGBA1C in the last 72 hours. CBG:  Recent Labs Lab 05/18/17 1229 05/18/17 1713 05/18/17 2143 05/19/17 0800 05/19/17 1203  GLUCAP 272* 211* 243* 136* 194*   Lipid Profile: No results for input(s): CHOL, HDL, LDLCALC, TRIG, CHOLHDL, LDLDIRECT in the last 72 hours. Thyroid Function Tests: No results for input(s): TSH, T4TOTAL, FREET4, T3FREE, THYROIDAB in the last 72 hours. Anemia Panel: No results for input(s): VITAMINB12, FOLATE, FERRITIN, TIBC, IRON, RETICCTPCT in the last 72 hours. Sepsis Labs:  Recent Labs Lab 05/12/17 1750  LATICACIDVEN 1.57    No results found for this or any previous visit (from the past 240 hour(s)).       Radiology Studies: No results  found.      Scheduled Meds: . clopidogrel  75 mg Oral Q breakfast  . divalproex  500 mg Oral QHS  . enoxaparin (LOVENOX) injection  40 mg Subcutaneous Q24H  . feeding supplement (ENSURE ENLIVE)  237 mL Oral Daily  . insulin aspart  0-15 Units Subcutaneous TID WC  . insulin glargine  9 Units Subcutaneous QPM  . polyethylene glycol  17 g Oral Daily   Continuous Infusions:    LOS: 7 days     Haydee Salter, MD [facility]  If 7PM-7AM, please contact night-coverage [email or web address] Password [facility] 05/19/2017, 3:39 PM

## 2017-05-19 NOTE — NC FL2 (Signed)
[place] MEDICAID FL2 LEVEL OF CARE SCREENING TOOL     IDENTIFICATION  Patient Name: Megan [name] Birthdate: [birth year] Sex: female Admission Date (Current Location): 05/12/2017  [facility] and [place] Number:  Producer, television/film/video and Address:  [facility] [place], [address] [place], [place] [address]      Provider Number: [id number]  Attending Physician Name and Address:  Haydee Salter, MD  Relative Name and Phone Number:  Jacey Eckerson [phone]    Current Level of Care: SNF Recommended Level of Care: Skilled Nursing Facility Prior Approval Number:    Date Approved/Denied:   PASRR Number: [id number]  Discharge Plan: SNF    Current Diagnoses: Patient Active Problem List   Diagnosis Date Noted  . Hyponatremia 05/12/2017  . Stroke (HCC)   . Acute renal failure superimposed on stage 3 chronic kidney disease (HCC) 10/23/2014  . Dehydration 10/23/2014  . Acute hyperkalemia 10/23/2014  . Abnormal urinalysis 10/23/2014  . CVA (cerebral infarction) 10/23/2014  . Chest pain 04/24/2014  . Acute encephalopathy 08/09/2013  . Altered mental status 08/08/2013  . Dementia with Lewy bodies 08/07/2013  . Vascular dementia 08/07/2013  . Sleepiness 08/07/2013  . Anemia 07/21/2013  . Protein-calorie malnutrition, severe (HCC) 07/15/2013  . Herpes zoster 07/13/2013  . Spastic hemiplegia affecting dominant side (HCC) 06/25/2013  . History of CVA w/residual right side deficits 09/03/2012  . History of partial seizures   . TIA (transient ischemic attack) 01/06/2012  . Community acquired pneumonia 01/06/2012  . Diabetes mellitus type II, uncontrolled (HCC) 01/06/2012  . HTN (hypertension) 01/06/2012  . H/O 01/06/2012    Orientation RESPIRATION BLADDER Height & Weight      (respond appropriately to commands)  Normal Incontinent Weight: 169 lb (76.7 kg) Height:   (172.7 cm)  BEHAVIORAL SYMPTOMS/MOOD NEUROLOGICAL BOWEL NUTRITION STATUS   Incontinent Diet (DSY3)  AMBULATORY STATUS COMMUNICATION OF NEEDS Skin   Extensive Assist Verbally Normal                       Personal Care Assistance Level of Assistance  Bathing, Feeding, Dressing Bathing Assistance: Maximum assistance Feeding assistance: Limited assistance (dsy3) Dressing Assistance: Maximum assistance     Functional Limitations Info  Sight, Hearing, Speech Sight Info: Adequate Hearing Info: Adequate Speech Info: Impaired    SPECIAL CARE FACTORS FREQUENCY  PT (By licensed PT), OT (By licensed OT)     PT Frequency: 5x wk OT Frequency: 5x wk            Contractures Contractures Info: Not present    Additional Factors Info  Code Status Code Status Info: DNR             Current Medications (05/19/2017):  This is the current hospital active medication list Current Facility-Administered Medications  Medication Dose Route Frequency Provider Last Rate Last Dose  . acetaminophen (TYLENOL) tablet 650 mg  650 mg Oral Q6H PRN Randel Pigg, Dorma Russell, MD       Or  . acetaminophen (TYLENOL) suppository 650 mg  650 mg Rectal Q6H PRN Randel Pigg, Dorma Russell, MD      . bisacodyl (DULCOLAX) EC tablet 5 mg  5 mg Oral Daily PRN Randel Pigg, Dorma Russell, MD      . clopidogrel (PLAVIX) tablet 75 mg  75 mg Oral Q breakfast Randel Pigg, Dorma Russell, MD   75 mg at 05/19/17 0850  . divalproex (DEPAKOTE ER) 24 hr tablet 500 mg  500 mg Oral QHS Jerolyn Center,  Gardiner Ramus, MD   500 mg at 05/18/17 2224  . enoxaparin (LOVENOX) injection 40 mg  40 mg Subcutaneous Q24H Randel Pigg, Dorma Russell, MD   40 mg at 05/18/17 2222  . feeding supplement (ENSURE ENLIVE) (ENSURE ENLIVE) liquid 237 mL  237 mL Oral Daily Haydee Salter, MD   237 mL at 05/19/17 1000  . insulin aspart (novoLOG) injection 0-15 Units  0-15 Units Subcutaneous TID WC Randel Pigg, Dorma Russell, MD   2 Units at 05/19/17 [phone]  . insulin glargine (LANTUS) injection 9 Units  9 Units Subcutaneous QPM Alwyn Ren, MD   9 Units at 05/18/17  1756  . ipratropium-albuterol (DUONEB) 0.5-2.5 (3) MG/3ML nebulizer solution 3 mL  3 mL Nebulization Q4H PRN Alwyn Ren, MD   3 mL at 05/17/17 2209  . magnesium hydroxide (MILK OF MAGNESIA) suspension 15 mL  15 mL Oral Daily PRN Alwyn Ren, MD      . magnesium hydroxide (MILK OF MAGNESIA) suspension 15 mL  15 mL Oral Daily PRN Alwyn Ren, MD   15 mL at 05/14/17 1212  . ondansetron (ZOFRAN) tablet 4 mg  4 mg Oral Q6H PRN Randel Pigg, Dorma Russell, MD       Or  . ondansetron [facility]) injection 4 mg  4 mg Intravenous Q6H PRN Randel Pigg, Dorma Russell, MD      . polyethylene glycol [facility] / GLYCOLAX) packet 17 g  17 g Oral Daily Alwyn Ren, MD   17 g at 05/16/17 1129  . sodium chloride (OCEAN) 0.65 % nasal spray 1 spray  1 spray Each Nare PRN Alwyn Ren, MD         Discharge Medications: Please see discharge summary for a list of discharge medications.  Relevant Imaging Results:  Relevant Lab Results:   Additional Information SS#[id number]  Althea Charon, LCSW

## 2017-05-20 LAB — BASIC METABOLIC PANEL
Anion gap: 6 (ref 5–15)
BUN: 16 mg/dL (ref 6–20)
CO2: 24 mmol/L (ref 22–32)
CREATININE: 0.86 mg/dL (ref 0.44–1.00)
Calcium: 9.4 mg/dL (ref 8.9–10.3)
Chloride: 98 mmol/L — ABNORMAL LOW (ref 101–111)
GFR, EST NON AFRICAN AMERICAN: 56 mL/min — AB (ref >60)
Glucose, Bld: 303 mg/dL — ABNORMAL HIGH (ref 65–99)
POTASSIUM: 5.1 mmol/L (ref 3.5–5.1)
SODIUM: 128 mmol/L — AB (ref 135–145)

## 2017-05-20 LAB — GLUCOSE, CAPILLARY
GLUCOSE-CAPILLARY: 257 mg/dL — AB (ref 65–99)
GLUCOSE-CAPILLARY: 141 mg/dL — AB (ref 65–99)
Glucose-Capillary: 205 mg/dL — ABNORMAL HIGH (ref 65–99)
Glucose-Capillary: 218 mg/dL — ABNORMAL HIGH (ref 65–99)

## 2017-05-20 LAB — OSMOLALITY, URINE: Osmolality, Ur: 487 mOsm/kg (ref 300–900)

## 2017-05-20 LAB — SODIUM, URINE, RANDOM: Sodium, Ur: 72 mmol/L

## 2017-05-20 LAB — CREATININE, URINE, RANDOM: CREATININE, URINE: 47.15 mg/dL

## 2017-05-20 MED ORDER — ENSURE ENLIVE PO LIQD
237.0000 mL | Freq: Two times a day (BID) | ORAL | Status: DC
  Administered 2017-05-21: 237 mL via ORAL

## 2017-05-20 NOTE — Consult Note (Signed)
Reason for Consult: hyponatremia Referring Physician: Jaleiyah Alas is an 83 y.o. female with severe dementia, diabetes mellitus, seizure disorder.  She was brought to the ER with dec responsiveness - found to have sodium of 121 - given IVF and it improved to 132 on 9/25 but then has trended down to 128 and that is the reason for the consult.  She is on Depakote and that can cause SIADH. There is a urine osmolality in the system which is inappropriately high. Family concerned.  Patient has been drinking a lot of water and not eating much   Trend in Creatinine: Creatinine, Ser  Date/Time Value Ref Range Status  05/20/2017 02:24 AM 0.86 0.44 - 1.00 mg/dL Final  16/05/9603 54:09 AM 0.71 0.44 - 1.00 mg/dL Final  81/19/1478 29:56 AM 0.67 0.44 - 1.00 mg/dL Final  21/30/8657 84:69 AM 0.72 0.44 - 1.00 mg/dL Final  62/95/2841 32:44 PM 0.74 0.44 - 1.00 mg/dL Final  08/25/7251 66:44 AM 0.71 0.44 - 1.00 mg/dL Final  03/47/4259 56:38 AM 0.79 0.44 - 1.00 mg/dL Final  75/64/3329 51:88 PM 0.83 0.44 - 1.00 mg/dL Final  41/66/0630 16:01 AM 0.82 0.44 - 1.00 mg/dL Final  09/32/3557 32:20 PM 1.00 0.44 - 1.00 mg/dL Final  25/42/7062 37:62 PM 1.12 (H) 0.44 - 1.00 mg/dL Final  83/15/1761 60:73 AM 0.96 0.57 - 1.00 mg/dL Final  71/01/2693 85:46 AM 1.10 (H) 0.44 - 1.00 mg/dL Final  27/10/5007 38:18 AM 1.05 (H) 0.44 - 1.00 mg/dL Final  29/93/7169 67:89 PM 0.78 0.50 - 1.10 mg/dL Final    Comment:    RESULTS VERIFIED VIA RECOLLECT  10/23/2014 01:03 PM 1.49 (H) 0.50 - 1.10 mg/dL Final  38/05/1750 02:58 AM 1.60 (H) 0.50 - 1.10 mg/dL Final  52/77/8242 35:36 AM 1.73 (H) 0.50 - 1.10 mg/dL Final  14/43/1540 08:67 AM 1.09 (H) 0.57 - 1.00 mg/dL Final  61/95/0932 67:12 AM 0.84 0.50 - 1.10 mg/dL Final  45/80/9983 38:25 PM 0.87 0.50 - 1.10 mg/dL Final  05/39/7673 41:93 PM 0.87 0.50 - 1.10 mg/dL Final  79/09/4095 35:32 PM 0.89 0.50 - 1.10 mg/dL Final  99/24/2683 41:96 AM 0.78 0.50 - 1.10 mg/dL Final  22/29/7989 21:19 AM  0.82 0.50 - 1.10 mg/dL Final  41/74/0814 48:18 PM 1.10 0.50 - 1.10 mg/dL Final  56/31/4970 26:37 PM 0.91 0.50 - 1.10 mg/dL Final  85/88/5027 74:12 AM 0.94 0.50 - 1.10 mg/dL Final  87/86/7672 09:47 AM 0.71 0.50 - 1.10 mg/dL Final  09/62/8366 29:47 AM 0.90 0.50 - 1.10 mg/dL Final  65/46/5035 46:56 AM 1.12 (H) 0.50 - 1.10 mg/dL Final    Comment:    RESULT REPEATED AND VERIFIED  07/16/2013 06:38 AM 0.60 0.50 - 1.10 mg/dL Final  81/27/5170 01:74 AM 0.56 0.50 - 1.10 mg/dL Final  94/49/6759 16:38 AM 0.75 0.50 - 1.10 mg/dL Final  46/65/9935 70:17 AM 0.67 0.50 - 1.10 mg/dL Final  79/39/0300 92:33 AM 0.90 0.50 - 1.10 mg/dL Final  00/76/2263 33:54 AM 0.97 0.50 - 1.10 mg/dL Final  56/25/6389 37:34 AM 0.56 0.50 - 1.10 mg/dL Final  28/76/8115 72:62 AM 0.63 0.50 - 1.10 mg/dL Final  03/55/9741 63:84 AM 0.66 0.50 - 1.10 mg/dL Final  53/64/6803 21:22 PM 0.70 0.50 - 1.10 mg/dL Final  48/25/0037 04:88 PM 0.66 0.50 - 1.10 mg/dL Final  89/16/9450 38:88 AM 0.85 0.50 - 1.10 mg/dL Final  28/00/3491 79:15 AM 0.80 0.50 - 1.10 mg/dL Final  05/69/7948 01:65 AM 0.73 0.50 - 1.10 mg/dL Final  53/74/8270 78:67 AM 0.92  0.50 - 1.10 mg/dL Final  45/40/9811 91:47 AM 0.68 0.50 - 1.10 mg/dL Final  82/95/6213 08:65 PM 0.66 0.50 - 1.10 mg/dL Final   Sodium  Date/Time Value Ref Range Status  05/20/2017 02:24 AM 128 (L) 135 - 145 mmol/L Final  05/19/2017 06:48 AM 129 (L) 135 - 145 mmol/L Final  05/18/2017 09:14 AM 131 (L) 135 - 145 mmol/L Final  05/17/2017 05:41 AM 132 (L) 135 - 145 mmol/L Final  05/16/2017 04:29 PM 128 (L) 135 - 145 mmol/L Final  05/16/2017 07:44 AM 132 (L) 135 - 145 mmol/L Final  05/15/2017 02:03 AM 131 (L) 135 - 145 mmol/L Final  05/13/2017 12:31 PM 130 (L) 135 - 145 mmol/L Final  05/13/2017 05:59 AM 127 (L) 135 - 145 mmol/L Final  05/12/2017 07:40 PM 125 (L) 135 - 145 mmol/L Final  05/12/2017 02:23 PM 121 (L) 135 - 145 mmol/L Final  06/30/2016 11:16 AM 139 134 - 144 mmol/L Final  05/10/2016 01:42  AM 132 (L) 135 - 145 mmol/L Final  05/10/2016 12:16 AM 130 (L) 135 - 145 mmol/L Final  10/25/2014 12:49 PM 138 135 - 145 mmol/L Final  10/23/2014 01:03 PM 139 135 - 145 mmol/L Final  10/23/2014 08:11 AM 138 135 - 145 mmol/L Final  10/23/2014 08:00 AM 136 135 - 145 mmol/L Final  05/24/2014 09:11 AM 134 134 - 144 mmol/L Final  04/25/2014 01:01 AM 139 137 - 147 mEq/L Final  04/24/2014 04:12 PM 137 137 - 147 mEq/L Final  09/07/2013 12:25 PM 140 137 - 147 mEq/L Final  08/12/2013 06:10 AM 140 135 - 145 mEq/L Final  08/09/2013 05:40 AM 131 (L) 135 - 145 mEq/L Final  08/08/2013 10:39 PM 134 (L) 135 - 145 mEq/L Final  08/08/2013 10:28 PM 130 (L) 135 - 145 mEq/L Final  08/04/2013 11:21 AM 134 (L) 135 - 145 mEq/L Final  07/21/2013 06:45 AM 137 135 - 145 mEq/L Final  07/18/2013 09:35 AM 136 135 - 145 mEq/L Final  07/17/2013 06:45 AM 137 135 - 145 mEq/L Final  07/16/2013 06:38 AM 135 135 - 145 mEq/L Final  07/14/2013 07:35 AM 131 (L) 135 - 145 mEq/L Final  07/13/2013 07:06 AM 133 (L) 135 - 145 mEq/L Final  09/04/2012 06:40 AM 140 135 - 145 mEq/L Final  09/03/2012 03:27 AM 140 135 - 145 mEq/L Final  09/03/2012 03:07 AM 136 135 - 145 mEq/L Final  01/08/2012 05:35 AM 141 135 - 145 mEq/L Final  01/07/2012 08:56 AM 139 135 - 145 mEq/L Final  01/06/2012 09:06 PM 139 135 - 145 mEq/L Final  01/06/2012 08:52 PM 134 (L) 135 - 145 mEq/L Final  04/05/2011 09:15 AM 136 135 - 145 mEq/L Final  03/31/2011 06:05 AM 138 135 - 145 mEq/L Final  03/22/2011 05:55 AM 136 135 - 145 mEq/L Final  03/17/2011 05:20 AM 135 135 - 145 mEq/L Final  03/14/2011 04:53 AM 137 135 - 145 mEq/L Final  03/13/2011 04:32 PM 138 135 - 145 mEq/L Final   PMH:   Past Medical History:  Diagnosis Date  . Diabetes mellitus   . Hyperlipidemia   . Seizure (HCC)   . Stroke [facility]) 2012   possible stroke Jan 2016    PSH:   Past Surgical History:  Procedure Laterality Date  . CATARACT EXTRACTION      Allergies:  Allergies  Allergen  Reactions  . Lipitor [Atorvastatin] Other (See Comments)    Myalgias     Medications:   Prior to  Admission medications   Medication Sig Start Date End Date Taking? Authorizing Provider  clopidogrel (PLAVIX) 75 MG tablet Take 1 tablet (75 mg total) by mouth daily with breakfast. 08/15/13  Yes [name], MD  divalproex (DEPAKOTE ER) 500 MG 24 hr tablet Take 1 tablet (500 mg total) by mouth at bedtime. 07/14/16  Yes Micki Riley, MD  furosemide (LASIX) 20 MG tablet Take 20 mg by mouth daily as needed. 04/13/17  Yes [provider]  guaiFENesin (MUCINEX) 600 MG 12 hr tablet Take 600 mg by mouth 2 (two) times daily as needed for to loosen phlegm.   Yes [provider]  memantine (NAMENDA XR) 28 MG CP24 24 hr capsule Take 1 capsule (28 mg total) by mouth daily. 12/09/15  Yes Micki Riley, MD  NYSTATIN powder Apply 1 application topically 2 (two) times daily as needed for rash. 05/04/17  Yes [provider]  pantoprazole (PROTONIX) 40 MG tablet Take 40 mg by mouth every morning.    Yes [provider]  rosuvastatin (CRESTOR) 5 MG tablet Take 5 mg by mouth every Monday, Wednesday, and Friday.   Yes [provider]  TOUJEO SOLOSTAR 300 UNIT/ML SOPN Inject 9 Units as directed every evening.  12/08/14  Yes [provider]  zinc oxide (BALMEX) 11.3 % CREA cream Apply 1 application topically daily.   Yes [provider]  sodium chloride 1 g tablet Take 1 tablet (1 g total) by mouth daily. 05/18/17 06/07/17  Haydee Salter, MD    Discontinued Meds:   Medications Discontinued During This Encounter  Medication Reason  . sulfamethoxazole-trimethoprim (BACTRIM,SEPTRA) 400-80 MG tablet Completed Course  . furosemide (LASIX) tablet 20 mg   . divalproex (DEPAKOTE ER) 24 hr tablet 500 mg Patient Preference  . ALPRAZolam (XANAX XR) 24 hr tablet 0.5 mg   . albuterol (PROVENTIL) (2.5 MG/3ML) 0.083% nebulizer solution 2.5 mg   . 0.9 %  sodium  chloride infusion     Social History:  reports that she has never smoked. She has quit using smokeless tobacco. Her smokeless tobacco use included Snuff. She reports that she does not drink alcohol or use drugs.  Family History:   Family History  Problem Relation Age of Onset  . CAD Mother   . Hypertension Mother   . Diabetes Mother   . Stroke Mother     Review of systems not obtained due to patient factors.  Blood pressure (!) 118/48, pulse 84, temperature 98.6 F (37 C), temperature source Oral, resp. rate 18, height  (1.727 m), weight 77.1 kg (170 lb), SpO2 98 %. General appearance: no distress and slowed mentation Resp: diminished breath sounds bilaterally Cardio: regular rate and rhythm, S1, S2 normal, no murmur, click, rub or gallop GI: soft, non-tender; bowel sounds normal; no masses,  no organomegaly Extremities: edema 1+ and legs are wrapped  Labs: Basic Metabolic Panel:  Recent Labs Lab 05/15/17 0203 05/16/17 0744 05/16/17 1629 05/17/17 0541 05/18/17 0914 05/19/17 0648 05/20/17 0224  NA 131* 132* 128* 132* 131* 129* 128*  K 4.9 4.8 >7.5* 4.5 4.8 4.7 5.1  CL 104 102 101 103 103 98* 98*  CO2 GLUCOSE 183* 200* 164* 135* 155* 138* 303*  BUN CREATININE 0.79 0.71 0.74 0.72 0.67 0.71 0.86  CALCIUM 8.9 9.4 9.3 9.1 9.5 9.2 9.4   Liver Function Tests: No results for input(s): AST, ALT, ALKPHOS,  BILITOT, PROT, ALBUMIN in the last 168 hours. No results for input(s): LIPASE, AMYLASE in the last 168 hours.  Recent Labs Lab 05/16/17 1313  AMMONIA 39*   CBC: No results for input(s): WBC, NEUTROABS, HGB, HCT, MCV, PLT in the last 168 hours. PT/INR: (inr:5) Cardiac Enzymes: No results for input(s): CKTOTAL, CKMB, CKMBINDEX, TROPONINI in the last 168 hours. CBG:  Recent Labs Lab 05/19/17 1203 05/19/17 1707 05/19/17 2121 05/20/17 0750 05/20/17 1202  GLUCAP 194* 284* 286* 257* 205*    Iron Studies:  No results for input(s): IRON, TIBC, TRANSFERRIN, FERRITIN in the last 168 hours.  Xrays/Other Studies: No results found.   Assessment/Plan: 83 year old black female- brought in with hyponatremia that was likely due to volume depletion/dehydration. It corrected with IV fluids but now is trending down again. Labs consistent with SIADH  1) hyponatremia- I tried my best to explain hyponatremia and SIADH to the family. Initially, she was volume depleted and it was appropriate to give her normal saline and her sodium did respond. After it corrected to 132, it began to trend down and I suspect that this is secondary to underlying SIADH from Depakote. I explained to the family that more focus be  based to be put on caloric/food intake then water intake- she should actually take in less water.   Significant sodium restriction is not necessary. I am hesitant to use the normal treatment options which would be loop diuretics or demeclocycline in this case. In addition, since she's been on Depakote for years don't suggest trying to switch her off of that medication. I a help the family feel a little bit better that his sodium in the high 120s is not detrimental to her and if we can keep a close to 130 that would be good.  Discharge is planned for tomorrow to SNF  Franceska Strahm A 05/20/2017, 2:39 PM

## 2017-05-20 NOTE — Care Management Important Message (Signed)
Important Message  Patient Details  Name: Megan [name] MRN: [id number] Date of Birth: 10-Aug-1922   Medicare Important Message Given:  Yes    Kyla Balzarine 05/20/2017, 2:25 PM

## 2017-05-20 NOTE — Care Management (Addendum)
11:30 am this CM received a call from , Montez Hageman daughter of Ms Lucianne Smestad stating that she was visiting SNFs and was not pleased with either facility. She was requesting that her mother, Ms R Bettendorf be allowed to stay in the hospital and allow her to visit more facilities over the weekend. This CM informed Ms BRIGHID KOCH that the pt was ready for d/c and that the plan was to d/c this pt today, 05/20/2017. This CM also informed Ms YEIMY BRABANT that failure to make a decision on a facility would most likely result in the pt insurance no covering a continued stay as the pt no longer met inpt criteria.  This CM then recommended that she visit other SNFs and make a decision. Ms NASHLA ALTHOFF stated that she only remembered one other offer. This CM contacted CSW Earlyne Iba   and ask that she call Ms JAMILEX BOHNSACK and remind her of her other options.

## 2017-05-20 NOTE — Progress Notes (Addendum)
PROGRESS NOTE    Megan [name]  NWG:[id number] DOB: 1922/07/11 DOA: 05/12/2017 PCP: [name], MD  83 y.o.femalewith medical history significant of severe dementia, diabetes, seizures and history of stroke with right hemiparesis presented to the emergency department after was brought by her family with confusion and not being herself. Patient not verbal my exam history provided by daughter. She reports that patient 4 days ago started being herself not talking much and urinating herself. She also reported decreased po intake. Daughter by the bed side,patient is awake.daughter reports patient better than yesterday but she is still concerned about the episodes she had yesterday when she was lethargic and was unable to wake her up.vss.  Patient was admitted with diagnosis of hyponatremia/metabolic encephalopathy.on the day of admission her sodium was 121 which is up to 132 with normal saline 75 mL an hour. She is daughter continue to be concerned about   patient not back to baseline.ocial and MRI of the brain was ordered which did not reveal any acute changes into a similar to the MRI she had in 2016. She also requested a vascular ultrasound of the  carotids artery. He denies being ordered today and needs to be followed up. She also requested a neurology consult Patient is a patient of Dr. Allena Katz. I have called in a consult to Dr. Amada Jupiter: See the patient today. Most likely plan would be to discharge her back home with her daughter Tuesday or Wednesday of this week. The workup ordered by neurology was negative. This is explained to the daughter at length. Likely patient's new baseline. Plans to discharge tomorrow morning.    Assessment & Plan:   Active Problems:   Hyponatremia Acute metabolicencephalopathy secondary to hyponatremia, will monitor na. Pt had received enough IVF. The head CT was negative for any acute changes. Chest x-ray was negative UA was negative. MRI showed no acute changes.  EEG showed no acute changes. Patient cleared by neurology who found no acute events or causes.  Hyponatremia due to decreased by mouth intake. Sodium improved after IV hydration with normal saline.Likely SIADH medication effect from depakote contributing.  Seizure disorder stable on Depakote.depakote level 58.  Type 2 diabetes Cont Lantus. SSI  Leg skin break down: WC nurse consult  Weakness: PT eval - rec D/C to SNF    DVT prophylaxis: LOVENOX Code Status: do not resuscitate Family Communication: DISCUSSED WITH DAUGHTERS Disposition Plan: PLAN TO DISCHARGE but family refusing   Consultants: NEUROLOGY  Procedures: NONE  Antimicrobials: NONE   Subjective: PATIENT IS AWAKE IN NO ACUTE DISTRESS. Dgtr at bedside. No issues overnight per nursing.  Objective: Vitals:   05/19/17 1739 05/19/17 2125 05/20/17 0559 05/20/17 1000  BP: (!) 116/48 (!) 127/48 (!) 123/52 (!) 118/48  Pulse: 90 89 93 84  Resp: Temp: 98.6 F (37 C) 98.9 F (37.2 C) 99.4 F (37.4 C) 98.6 F (37 C)  TempSrc: Oral Oral Oral Oral  SpO2: 100% 100% 98% 98%  Weight:  77.1 kg (170 lb)    Height:        Intake/Output Summary (Last 24 hours) at 05/20/17 1623 Last data filed at 05/20/17 0900  Gross per 24 hour  Intake              300 ml  Output              950 ml  Net             -650 ml  Filed Weights   05/17/17 2156 05/18/17 2200 05/19/17 2125  Weight: 76.7 kg (169 lb 1.6 oz) 76.7 kg (169 lb) 77.1 kg (170 lb)    Examination:  General exam: Appears calm and comfortable Grossly hard of hearing. Respiratory system: Clear to auscultation. Respiratory effort normal. Cardiovascular system: S1 & S2 heard, RRR. No JVD, murmurs, rubs, gallops or clicks. No pedal edema. Gastrointestinal system: Abdomen is nondistended, soft and nontender. No organomegaly or masses felt. Normal bowel sounds heard. Central nervous system: Alert and oriented. No focal neurological deficits. Extremities:  contracted Skin: No rashes, lesions or ulcers Psychiatry: Judgement and insight appear normal. Mood & affect appropriate.     Data Reviewed: I have personally reviewed following labs and imaging studies  CBC: No results for input(s): WBC, NEUTROABS, HGB, HCT, MCV, PLT in the last 168 hours. Basic Metabolic Panel:  Recent Labs Lab 05/16/17 1629 05/17/17 0541 05/18/17 0914 05/19/17 0648 05/20/17 0224  NA 128* 132* 131* 129* 128*  K >7.5* 4.5 4.8 4.7 5.1  CL 101 103 103 98* 98*  CO2 GLUCOSE 164* 135* 155* 138* 303*  BUN CREATININE 0.74 0.72 0.67 0.71 0.86  CALCIUM 9.3 9.1 9.5 9.2 9.4   GFR: Estimated Creatinine Clearance: 44.6 mL/min (by C-G formula based on SCr of 0.86 mg/dL). Liver Function Tests: No results for input(s): AST, ALT, ALKPHOS, BILITOT, PROT, ALBUMIN in the last 168 hours. No results for input(s): LIPASE, AMYLASE in the last 168 hours.  Recent Labs Lab 05/16/17 1313  AMMONIA 39*   Coagulation Profile: No results for input(s): INR, PROTIME in the last 168 hours. Cardiac Enzymes: No results for input(s): CKTOTAL, CKMB, CKMBINDEX, TROPONINI in the last 168 hours. BNP (last 3 results) No results for input(s): PROBNP in the last 8760 hours. HbA1C: No results for input(s): HGBA1C in the last 72 hours. CBG:  Recent Labs Lab 05/19/17 1203 05/19/17 1707 05/19/17 2121 05/20/17 0750 05/20/17 1202  GLUCAP 194* 284* 286* 257* 205*   Lipid Profile: No results for input(s): CHOL, HDL, LDLCALC, TRIG, CHOLHDL, LDLDIRECT in the last 72 hours. Thyroid Function Tests: No results for input(s): TSH, T4TOTAL, FREET4, T3FREE, THYROIDAB in the last 72 hours. Anemia Panel: No results for input(s): VITAMINB12, FOLATE, FERRITIN, TIBC, IRON, RETICCTPCT in the last 72 hours. Sepsis Labs: No results for input(s): PROCALCITON, LATICACIDVEN in the last 168 hours.  No results found for this or any previous visit (from the past 240 hour(s)).        Radiology Studies: No results found.      Scheduled Meds: . clopidogrel  75 mg Oral Q breakfast  . divalproex  500 mg Oral QHS  . enoxaparin (LOVENOX) injection  40 mg Subcutaneous Q24H  . feeding supplement (ENSURE ENLIVE)  237 mL Oral Daily  . insulin aspart  0-15 Units Subcutaneous TID WC  . insulin glargine  9 Units Subcutaneous QPM  . polyethylene glycol  17 g Oral Daily   Continuous Infusions:    LOS: 8 days     Haydee Salter, MD [facility]  If 7PM-7AM, please contact night-coverage [email or web address] Password [facility] 05/20/2017, 4:23 PM

## 2017-05-21 DIAGNOSIS — R569 Unspecified convulsions: Secondary | ICD-10-CM

## 2017-05-21 LAB — GLUCOSE, CAPILLARY
Glucose-Capillary: 148 mg/dL — ABNORMAL HIGH (ref 65–99)
Glucose-Capillary: 231 mg/dL — ABNORMAL HIGH (ref 65–99)

## 2017-05-21 LAB — UREA NITROGEN, URINE: UREA NITROGEN UR: 361 mg/dL

## 2017-05-21 MED ORDER — BACITRACIN ZINC 500 UNIT/GM EX OINT: TOPICAL_OINTMENT | CUTANEOUS | 0 refills | Status: AC

## 2017-05-21 MED ORDER — ENSURE ENLIVE PO LIQD: 237.0000 mL | Freq: Two times a day (BID) | ORAL | 12 refills | Status: DC

## 2017-05-21 NOTE — Progress Notes (Signed)
Report called to [place] at [facility]. Bess Kinds, RN

## 2017-05-21 NOTE — Progress Notes (Signed)
Clinical Social Worker facilitated patient discharge including contacting patient family and facility to confirm patient discharge plans.  Clinical information faxed to facility and family agreeable with plan.  CSW arranged ambulance transport via [facility] to [facility]. [facility] has been set up for 3 pm pick up of patient .  RN Toniann Fail to call [phone] (pt will go in rm#[id number]) for report prior to discharge.  Clinical Social Worker will sign off for now as social work intervention is no longer needed. Please consult [place] again if new need arises.  Marrianne Mood, MSW, [employer] [phone]

## 2017-06-18 ENCOUNTER — Inpatient Hospital Stay (HOSPITAL_COMMUNITY)
Admission: EM | Admit: 2017-06-18 | Discharge: 2017-06-21 | DRG: 070 | Disposition: A | Discharge status: Home Health | Payer: Medicare Other | Attending: Internal Medicine | Admitting: Internal Medicine

## 2017-06-18 ENCOUNTER — Encounter (HOSPITAL_COMMUNITY): Payer: Self-pay | Admitting: Emergency Medicine

## 2017-06-18 ENCOUNTER — Emergency Department (HOSPITAL_COMMUNITY): Payer: Medicare Other

## 2017-06-18 DIAGNOSIS — R4182 Altered mental status, unspecified: Secondary | ICD-10-CM | POA: Diagnosis present

## 2017-06-18 DIAGNOSIS — L97409 Non-pressure chronic ulcer of unspecified heel and midfoot with unspecified severity: Secondary | ICD-10-CM | POA: Diagnosis present

## 2017-06-18 DIAGNOSIS — L89323 Pressure ulcer of left buttock, stage 3: Secondary | ICD-10-CM | POA: Diagnosis present

## 2017-06-18 DIAGNOSIS — T426X5A Adverse effect of other antiepileptic and sedative-hypnotic drugs, initial encounter: Secondary | ICD-10-CM | POA: Diagnosis present

## 2017-06-18 DIAGNOSIS — Z87891 Personal history of nicotine dependence: Secondary | ICD-10-CM

## 2017-06-18 DIAGNOSIS — E785 Hyperlipidemia, unspecified: Secondary | ICD-10-CM | POA: Diagnosis present

## 2017-06-18 DIAGNOSIS — F0390 Unspecified dementia without behavioral disturbance: Secondary | ICD-10-CM | POA: Diagnosis present

## 2017-06-18 DIAGNOSIS — I5032 Chronic diastolic (congestive) heart failure: Secondary | ICD-10-CM | POA: Diagnosis present

## 2017-06-18 DIAGNOSIS — L89620 Pressure ulcer of left heel, unstageable: Secondary | ICD-10-CM | POA: Diagnosis present

## 2017-06-18 DIAGNOSIS — E1165 Type 2 diabetes mellitus with hyperglycemia: Secondary | ICD-10-CM | POA: Diagnosis present

## 2017-06-18 DIAGNOSIS — Z794 Long term (current) use of insulin: Secondary | ICD-10-CM

## 2017-06-18 DIAGNOSIS — E11621 Type 2 diabetes mellitus with foot ulcer: Secondary | ICD-10-CM | POA: Diagnosis present

## 2017-06-18 DIAGNOSIS — G9341 Metabolic encephalopathy: Principal | ICD-10-CM | POA: Diagnosis present

## 2017-06-18 DIAGNOSIS — E871 Hypo-osmolality and hyponatremia: Secondary | ICD-10-CM | POA: Diagnosis not present

## 2017-06-18 DIAGNOSIS — Z8669 Personal history of other diseases of the nervous system and sense organs: Secondary | ICD-10-CM | POA: Diagnosis not present

## 2017-06-18 DIAGNOSIS — Z8249 Family history of ischemic heart disease and other diseases of the circulatory system: Secondary | ICD-10-CM | POA: Diagnosis not present

## 2017-06-18 DIAGNOSIS — N179 Acute kidney failure, unspecified: Secondary | ICD-10-CM | POA: Insufficient documentation

## 2017-06-18 DIAGNOSIS — I69351 Hemiplegia and hemiparesis following cerebral infarction affecting right dominant side: Secondary | ICD-10-CM | POA: Diagnosis not present

## 2017-06-18 DIAGNOSIS — G3183 Dementia with Lewy bodies: Secondary | ICD-10-CM | POA: Diagnosis not present

## 2017-06-18 DIAGNOSIS — L89152 Pressure ulcer of sacral region, stage 2: Secondary | ICD-10-CM | POA: Insufficient documentation

## 2017-06-18 DIAGNOSIS — L89313 Pressure ulcer of right buttock, stage 3: Secondary | ICD-10-CM | POA: Diagnosis present

## 2017-06-18 DIAGNOSIS — E861 Hypovolemia: Secondary | ICD-10-CM | POA: Diagnosis present

## 2017-06-18 DIAGNOSIS — G40909 Epilepsy, unspecified, not intractable, without status epilepticus: Secondary | ICD-10-CM | POA: Diagnosis present

## 2017-06-18 DIAGNOSIS — I110 Hypertensive heart disease with heart failure: Secondary | ICD-10-CM | POA: Diagnosis present

## 2017-06-18 DIAGNOSIS — Z8673 Personal history of transient ischemic attack (TIA), and cerebral infarction without residual deficits: Secondary | ICD-10-CM | POA: Diagnosis not present

## 2017-06-18 DIAGNOSIS — Z833 Family history of diabetes mellitus: Secondary | ICD-10-CM

## 2017-06-18 DIAGNOSIS — L89150 Pressure ulcer of sacral region, unstageable: Secondary | ICD-10-CM | POA: Diagnosis not present

## 2017-06-18 DIAGNOSIS — L89612 Pressure ulcer of right heel, stage 2: Secondary | ICD-10-CM | POA: Diagnosis present

## 2017-06-18 DIAGNOSIS — I950 Idiopathic hypotension: Secondary | ICD-10-CM

## 2017-06-18 DIAGNOSIS — D638 Anemia in other chronic diseases classified elsewhere: Secondary | ICD-10-CM | POA: Diagnosis present

## 2017-06-18 DIAGNOSIS — D649 Anemia, unspecified: Secondary | ICD-10-CM | POA: Diagnosis not present

## 2017-06-18 DIAGNOSIS — Z888 Allergy status to other drugs, medicaments and biological substances status: Secondary | ICD-10-CM | POA: Diagnosis not present

## 2017-06-18 DIAGNOSIS — Z823 Family history of stroke: Secondary | ICD-10-CM

## 2017-06-18 DIAGNOSIS — IMO0002 Reserved for concepts with insufficient information to code with codable children: Secondary | ICD-10-CM | POA: Diagnosis present

## 2017-06-18 DIAGNOSIS — Z79899 Other long term (current) drug therapy: Secondary | ICD-10-CM

## 2017-06-18 DIAGNOSIS — G9340 Encephalopathy, unspecified: Secondary | ICD-10-CM

## 2017-06-18 DIAGNOSIS — F0150 Vascular dementia without behavioral disturbance: Secondary | ICD-10-CM

## 2017-06-18 DIAGNOSIS — E222 Syndrome of inappropriate secretion of antidiuretic hormone: Secondary | ICD-10-CM | POA: Diagnosis present

## 2017-06-18 DIAGNOSIS — Z7902 Long term (current) use of antithrombotics/antiplatelets: Secondary | ICD-10-CM

## 2017-06-18 DIAGNOSIS — Z66 Do not resuscitate: Secondary | ICD-10-CM | POA: Diagnosis present

## 2017-06-18 DIAGNOSIS — F0280 Dementia in other diseases classified elsewhere without behavioral disturbance: Secondary | ICD-10-CM | POA: Diagnosis present

## 2017-06-18 DIAGNOSIS — E860 Dehydration: Secondary | ICD-10-CM

## 2017-06-18 LAB — BASIC METABOLIC PANEL
ANION GAP: 8 (ref 5–15)
BUN: 20 mg/dL (ref 6–20)
CHLORIDE: 101 mmol/L (ref 101–111)
CO2: 22 mmol/L (ref 22–32)
Calcium: 8.9 mg/dL (ref 8.9–10.3)
Creatinine, Ser: 0.76 mg/dL (ref 0.44–1.00)
GFR calc non Af Amer: >60 mL/min (ref >60)
Glucose, Bld: 231 mg/dL — ABNORMAL HIGH (ref 65–99)
POTASSIUM: 4.3 mmol/L (ref 3.5–5.1)
SODIUM: 131 mmol/L — AB (ref 135–145)

## 2017-06-18 LAB — URINALYSIS, ROUTINE W REFLEX MICROSCOPIC
BACTERIA UA: NONE SEEN
Bilirubin Urine: NEGATIVE
HGB URINE DIPSTICK: NEGATIVE
Ketones, ur: NEGATIVE mg/dL
Leukocytes, UA: NEGATIVE
NITRITE: NEGATIVE
PH: 7 (ref 5.0–8.0)
Protein, ur: NEGATIVE mg/dL
SPECIFIC GRAVITY, URINE: 1.013 (ref 1.005–1.030)
Squamous Epithelial / LPF: NONE SEEN

## 2017-06-18 LAB — CBC WITH DIFFERENTIAL/PLATELET
BASOS PCT: 0 %
Basophils Absolute: 0 10*3/uL (ref 0.0–0.1)
Basophils Absolute: 0 10*3/uL (ref 0.0–0.1)
Basophils Relative: 0 %
EOS ABS: 0.1 10*3/uL (ref 0.0–0.7)
EOS PCT: 1 %
Eosinophils Absolute: 0.1 10*3/uL (ref 0.0–0.7)
Eosinophils Relative: 1 %
HEMATOCRIT: 30.2 % — AB (ref 36.0–46.0)
HEMATOCRIT: 31 % — AB (ref 36.0–46.0)
HEMOGLOBIN: 10.2 g/dL — AB (ref 12.0–15.0)
HEMOGLOBIN: 9.7 g/dL — AB (ref 12.0–15.0)
LYMPHS ABS: 3.8 10*3/uL (ref 0.7–4.0)
LYMPHS PCT: 38 %
Lymphocytes Relative: 34 %
Lymphs Abs: 3.5 10*3/uL (ref 0.7–4.0)
MCH: 27.6 pg (ref 26.0–34.0)
MCH: 28.6 pg (ref 26.0–34.0)
MCHC: 32.1 g/dL (ref 30.0–36.0)
MCHC: 32.9 g/dL (ref 30.0–36.0)
MCV: 86.8 fL (ref 78.0–100.0)
MCV: 86 fL (ref 78.0–100.0)
MONO ABS: 1.2 10*3/uL — AB (ref 0.1–1.0)
MONOS PCT: 12 %
Monocytes Absolute: 1 10*3/uL (ref 0.1–1.0)
Monocytes Relative: 10 %
NEUTROS ABS: 5.2 10*3/uL (ref 1.7–7.7)
Neutro Abs: 5.4 10*3/uL (ref 1.7–7.7)
Neutrophils Relative %: 53 %
Neutrophils Relative %: 51 %
Platelets: 221 10*3/uL (ref 150–400)
Platelets: 222 10*3/uL (ref 150–400)
RBC: 3.57 MIL/uL — ABNORMAL LOW (ref 3.87–5.11)
RBC: 3.51 MIL/uL — AB (ref 3.87–5.11)
RDW: 14.8 % (ref 11.5–15.5)
RDW: 15.2 % (ref 11.5–15.5)
WBC: 10.2 10*3/uL (ref 4.0–10.5)
WBC: 10.2 10*3/uL (ref 4.0–10.5)

## 2017-06-18 LAB — COMPREHENSIVE METABOLIC PANEL
ALK PHOS: 86 U/L (ref 38–126)
ALT: 9 U/L — AB (ref 14–54)
ANION GAP: 9 (ref 5–15)
AST: 18 U/L (ref 15–41)
Albumin: 2.4 g/dL — ABNORMAL LOW (ref 3.5–5.0)
BILIRUBIN TOTAL: 0.2 mg/dL — AB (ref 0.3–1.2)
BUN: 25 mg/dL — ABNORMAL HIGH (ref 6–20)
CALCIUM: 9.3 mg/dL (ref 8.9–10.3)
CO2: 22 mmol/L (ref 22–32)
CREATININE: 0.98 mg/dL (ref 0.44–1.00)
Chloride: 98 mmol/L — ABNORMAL LOW (ref 101–111)
GFR, EST AFRICAN AMERICAN: 56 mL/min — AB (ref >60)
GFR, EST NON AFRICAN AMERICAN: 48 mL/min — AB (ref >60)
Glucose, Bld: 227 mg/dL — ABNORMAL HIGH (ref 65–99)
Potassium: 4.4 mmol/L (ref 3.5–5.1)
Sodium: 129 mmol/L — ABNORMAL LOW (ref 135–145)
TOTAL PROTEIN: 6.2 g/dL — AB (ref 6.5–8.1)

## 2017-06-18 LAB — I-STAT CHEM 8, ED
BUN: 26 mg/dL — AB (ref 6–20)
CALCIUM ION: 1.2 mmol/L (ref 1.15–1.40)
CREATININE: 1 mg/dL (ref 0.44–1.00)
Chloride: 100 mmol/L — ABNORMAL LOW (ref 101–111)
Glucose, Bld: 229 mg/dL — ABNORMAL HIGH (ref 65–99)
HCT: 30 % — ABNORMAL LOW (ref 36.0–46.0)
HEMOGLOBIN: 10.2 g/dL — AB (ref 12.0–15.0)
Potassium: 4.3 mmol/L (ref 3.5–5.1)
SODIUM: 132 mmol/L — AB (ref 135–145)
TCO2: 23 mmol/L (ref 22–32)

## 2017-06-18 LAB — GLUCOSE, CAPILLARY
GLUCOSE-CAPILLARY: 194 mg/dL — AB (ref 65–99)
GLUCOSE-CAPILLARY: 284 mg/dL — AB (ref 65–99)
GLUCOSE-CAPILLARY: 309 mg/dL — AB (ref 65–99)
GLUCOSE-CAPILLARY: 339 mg/dL — AB (ref 65–99)
GLUCOSE-CAPILLARY: 230 mg/dL — AB (ref 65–99)

## 2017-06-18 LAB — AMMONIA: Ammonia: 35 umol/L (ref 9–35)

## 2017-06-18 LAB — RPR: RPR: NONREACTIVE

## 2017-06-18 LAB — VITAMIN B12: Vitamin B-12: 824 pg/mL (ref 180–914)

## 2017-06-18 LAB — I-STAT TROPONIN, ED: Troponin i, poc: 0 ng/mL (ref 0.00–0.08)

## 2017-06-18 LAB — MRSA PCR SCREENING: MRSA BY PCR: NEGATIVE

## 2017-06-18 LAB — LACTIC ACID, PLASMA: LACTIC ACID, VENOUS: 1.2 mmol/L (ref 0.5–1.9)

## 2017-06-18 MED ORDER — BISACODYL 5 MG PO TBEC: 5.0000 mg | DELAYED_RELEASE_TABLET | Freq: Every day | ORAL | Status: DC | PRN

## 2017-06-18 MED ORDER — MELATONIN 5 MG PO TABS
5.0000 mg | ORAL_TABLET | Freq: Every evening | ORAL | Status: DC | PRN
  Filled 2017-06-18: qty 1

## 2017-06-18 MED ORDER — DEXTROSE 5 % IV SOLN
1.0000 g | Freq: Every day | INTRAVENOUS | Status: DC
  Administered 2017-06-18 – 2017-06-20 (×3): 1 g via INTRAVENOUS
  Filled 2017-06-18 (×3): qty 10

## 2017-06-18 MED ORDER — ACETAMINOPHEN 650 MG RE SUPP: 650.0000 mg | Freq: Four times a day (QID) | RECTAL | Status: DC | PRN

## 2017-06-18 MED ORDER — INSULIN ASPART 100 UNIT/ML ~~LOC~~ SOLN
0.0000 [IU] | SUBCUTANEOUS | Status: DC
  Administered 2017-06-18: 2 [IU] via SUBCUTANEOUS
  Administered 2017-06-18: 5 [IU] via SUBCUTANEOUS

## 2017-06-18 MED ORDER — SENNOSIDES-DOCUSATE SODIUM 8.6-50 MG PO TABS: 1.0000 | ORAL_TABLET | Freq: Every evening | ORAL | Status: DC | PRN

## 2017-06-18 MED ORDER — ENOXAPARIN SODIUM 40 MG/0.4ML ~~LOC~~ SOLN
40.0000 mg | SUBCUTANEOUS | Status: DC
  Administered 2017-06-18 – 2017-06-20 (×3): 40 mg via SUBCUTANEOUS
  Filled 2017-06-18 (×3): qty 0.4

## 2017-06-18 MED ORDER — HYDROCODONE-ACETAMINOPHEN 5-325 MG PO TABS: 1.0000 | ORAL_TABLET | ORAL | Status: DC | PRN

## 2017-06-18 MED ORDER — INSULIN ASPART 100 UNIT/ML ~~LOC~~ SOLN: 0.0000 [IU] | Freq: Three times a day (TID) | SUBCUTANEOUS | Status: DC

## 2017-06-18 MED ORDER — ENOXAPARIN SODIUM 40 MG/0.4ML ~~LOC~~ SOLN
40.0000 mg | Freq: Every day | SUBCUTANEOUS | Status: DC
  Administered 2017-06-18: 40 mg via SUBCUTANEOUS
  Filled 2017-06-18: qty 0.4

## 2017-06-18 MED ORDER — ENSURE ENLIVE PO LIQD
237.0000 mL | Freq: Two times a day (BID) | ORAL | Status: DC
  Administered 2017-06-18 (×2): 237 mL via ORAL

## 2017-06-18 MED ORDER — INSULIN GLARGINE 100 UNIT/ML ~~LOC~~ SOLN
9.0000 [IU] | Freq: Every day | SUBCUTANEOUS | Status: DC
  Administered 2017-06-18: 9 [IU] via SUBCUTANEOUS
  Filled 2017-06-18: qty 0.09

## 2017-06-18 MED ORDER — VANCOMYCIN HCL IN DEXTROSE 1-5 GM/200ML-% IV SOLN
1000.0000 mg | Freq: Once | INTRAVENOUS | Status: AC
  Administered 2017-06-18: 1000 mg via INTRAVENOUS
  Filled 2017-06-18: qty 200

## 2017-06-18 MED ORDER — SODIUM CHLORIDE 0.9 % IV BOLUS (SEPSIS)
500.0000 mL | Freq: Once | INTRAVENOUS | Status: AC
  Administered 2017-06-18: 500 mL via INTRAVENOUS

## 2017-06-18 MED ORDER — CLOPIDOGREL BISULFATE 75 MG PO TABS
75.0000 mg | ORAL_TABLET | Freq: Every day | ORAL | Status: DC
  Administered 2017-06-18 – 2017-06-21 (×4): 75 mg via ORAL
  Filled 2017-06-18 (×4): qty 1

## 2017-06-18 MED ORDER — ROSUVASTATIN CALCIUM 5 MG PO TABS
5.0000 mg | ORAL_TABLET | Freq: Every day | ORAL | Status: DC
  Filled 2017-06-18: qty 1

## 2017-06-18 MED ORDER — ORAL CARE MOUTH RINSE
15.0000 mL | Freq: Two times a day (BID) | OROMUCOSAL | Status: DC
  Administered 2017-06-18 – 2017-06-21 (×5): 15 mL via OROMUCOSAL

## 2017-06-18 MED ORDER — PANTOPRAZOLE SODIUM 40 MG PO TBEC
40.0000 mg | DELAYED_RELEASE_TABLET | Freq: Every morning | ORAL | Status: DC
  Administered 2017-06-18 – 2017-06-21 (×4): 40 mg via ORAL
  Filled 2017-06-18 (×4): qty 1

## 2017-06-18 MED ORDER — MEMANTINE HCL ER 14 MG PO CP24
14.0000 mg | ORAL_CAPSULE | Freq: Every day | ORAL | Status: DC
  Administered 2017-06-18 – 2017-06-20 (×3): 14 mg via ORAL
  Filled 2017-06-18 (×5): qty 1

## 2017-06-18 MED ORDER — ACETAMINOPHEN 325 MG PO TABS: 650.0000 mg | ORAL_TABLET | Freq: Four times a day (QID) | ORAL | Status: DC | PRN

## 2017-06-18 MED ORDER — MEMANTINE HCL ER 14 MG PO CP24: 14.0000 mg | ORAL_CAPSULE | Freq: Every day | ORAL | Status: DC

## 2017-06-18 MED ORDER — PIPERACILLIN-TAZOBACTAM 3.375 G IVPB 30 MIN
3.3750 g | Freq: Once | INTRAVENOUS | Status: AC
  Administered 2017-06-18: 3.375 g via INTRAVENOUS
  Filled 2017-06-18: qty 50

## 2017-06-18 MED ORDER — SODIUM CHLORIDE 0.9 % IV SOLN: INTRAVENOUS | Status: DC

## 2017-06-18 MED ORDER — INSULIN ASPART 100 UNIT/ML ~~LOC~~ SOLN
0.0000 [IU] | Freq: Every day | SUBCUTANEOUS | Status: DC
Start: 2017-06-18 — End: 2017-06-21
  Administered 2017-06-18: 2 [IU] via SUBCUTANEOUS

## 2017-06-18 MED ORDER — GERHARDT'S BUTT CREAM
TOPICAL_CREAM | Freq: Three times a day (TID) | CUTANEOUS | Status: DC
  Administered 2017-06-18 – 2017-06-21 (×9): via TOPICAL
  Filled 2017-06-18: qty 1

## 2017-06-18 MED ORDER — INSULIN GLARGINE 100 UNIT/ML ~~LOC~~ SOLN
12.0000 [IU] | Freq: Every day | SUBCUTANEOUS | Status: DC
  Administered 2017-06-19 – 2017-06-20 (×2): 12 [IU] via SUBCUTANEOUS
  Filled 2017-06-18 (×4): qty 0.12

## 2017-06-18 MED ORDER — MELATONIN 3 MG PO TABS
3.0000 mg | ORAL_TABLET | Freq: Every evening | ORAL | Status: DC | PRN
  Administered 2017-06-18 – 2017-06-20 (×3): 3 mg via ORAL
  Filled 2017-06-18 (×5): qty 1

## 2017-06-18 MED ORDER — ONDANSETRON HCL 4 MG/2ML IJ SOLN: 4.0000 mg | Freq: Four times a day (QID) | INTRAMUSCULAR | Status: DC | PRN

## 2017-06-18 MED ORDER — INSULIN ASPART 100 UNIT/ML ~~LOC~~ SOLN
0.0000 [IU] | Freq: Three times a day (TID) | SUBCUTANEOUS | Status: DC
  Administered 2017-06-18: 11 [IU] via SUBCUTANEOUS
  Administered 2017-06-19: 5 [IU] via SUBCUTANEOUS
  Administered 2017-06-19: 2 [IU] via SUBCUTANEOUS
  Administered 2017-06-19: 5 [IU] via SUBCUTANEOUS
  Administered 2017-06-20: 2 [IU] via SUBCUTANEOUS
  Administered 2017-06-20 – 2017-06-21 (×3): 5 [IU] via SUBCUTANEOUS
  Administered 2017-06-21: 2 [IU] via SUBCUTANEOUS

## 2017-06-18 MED ORDER — DIVALPROEX SODIUM ER 250 MG PO TB24
500.0000 mg | ORAL_TABLET | Freq: Every day | ORAL | Status: DC
  Filled 2017-06-18 (×2): qty 1

## 2017-06-18 MED ORDER — SODIUM CHLORIDE 0.9% FLUSH
3.0000 mL | Freq: Two times a day (BID) | INTRAVENOUS | Status: DC
  Administered 2017-06-18 – 2017-06-20 (×2): 3 mL via INTRAVENOUS

## 2017-06-18 MED ORDER — SODIUM CHLORIDE 0.9 % IV SOLN
INTRAVENOUS | Status: DC
  Administered 2017-06-18: 17:00:00 via INTRAVENOUS
  Administered 2017-06-19: 40 mL/h via INTRAVENOUS

## 2017-06-18 MED ORDER — INSULIN ASPART 100 UNIT/ML ~~LOC~~ SOLN: 3.0000 [IU] | Freq: Three times a day (TID) | SUBCUTANEOUS | Status: DC

## 2017-06-18 MED ORDER — VALPROATE SODIUM 500 MG/5ML IV SOLN
500.0000 mg | Freq: Every day | INTRAVENOUS | Status: DC
  Administered 2017-06-18: 500 mg via INTRAVENOUS
  Filled 2017-06-18: qty 5

## 2017-06-18 MED ORDER — GUAIFENESIN ER 600 MG PO TB12: 600.0000 mg | ORAL_TABLET | Freq: Two times a day (BID) | ORAL | Status: DC | PRN

## 2017-06-18 MED ORDER — ONDANSETRON HCL 4 MG PO TABS: 4.0000 mg | ORAL_TABLET | Freq: Four times a day (QID) | ORAL | Status: DC | PRN

## 2017-06-18 NOTE — ED Triage Notes (Signed)
The pt arrived by [facility] from home confusion for 2 days bp when ems arrived bp 70/34 p90  cbg 303  Ems bolused the pt with of nss .  History of a uti  Family thinks this is the cause again  ivs x 2 per ems  Rt sided parfalysis from old cva  Pt usually travels in a w/c

## 2017-06-18 NOTE — ED Provider Notes (Signed)
[facility] EMERGENCY DEPARTMENT Provider Note   CSN: [id number] Arrival date & time: 06/18/17  0031     History   Chief Complaint Chief Complaint  Patient presents with  . Hypotension    HPI Megan [name] is a 83 y.o. female.  The history is provided by the patient.  Altered Mental Status   This is a recurrent problem. The current episode started yesterday. The problem has not changed since onset.Associated symptoms include confusion. Pertinent negatives include no seizures, no unresponsiveness, no agitation, no hallucinations and no self-injury. Risk factors include a recent illness. Her past medical history does not include seizures or TIA.    Past Medical History:  Diagnosis Date  . Diabetes mellitus   . Hyperlipidemia   . Seizure (HCC)   . Stroke [facility]) 2012   possible stroke Jan 2016    Patient Active Problem List   Diagnosis Date Noted  . SIADH (syndrome of inappropriate ADH production) (HCC) 06/18/2017  . Heel ulcer (HCC) 06/18/2017  . Unstageable pressure ulcer of sacral region (HCC) 06/18/2017  . Decubitus ulcer of sacral region, stage 2   . Idiopathic hypotension   . Seizure (HCC) 05/21/2017  . Hyponatremia 05/12/2017  . Acute renal failure superimposed on stage 3 chronic kidney disease (HCC) 10/23/2014  . Dehydration 10/23/2014  . Acute hyperkalemia 10/23/2014  . Abnormal urinalysis 10/23/2014  . Chest pain 04/24/2014  . Acute encephalopathy 08/09/2013  . Altered mental status 08/08/2013  . Dementia with Lewy bodies 08/07/2013  . Vascular dementia 08/07/2013  . Sleepiness 08/07/2013  . Anemia 07/21/2013  . Protein-calorie malnutrition, severe (HCC) 07/15/2013  . Herpes zoster 07/13/2013  . Spastic hemiplegia affecting dominant side (HCC) 06/25/2013  . History of CVA w/residual right side deficits 09/03/2012  . History of partial seizures   . TIA (transient ischemic attack) 01/06/2012  . Community acquired pneumonia 01/06/2012  .  Diabetes mellitus type II, uncontrolled (HCC) 01/06/2012  . HTN (hypertension) 01/06/2012  . H/O 01/06/2012    Past Surgical History:  Procedure Laterality Date  . CATARACT EXTRACTION      OB History    No data available       Home Medications    Prior to Admission medications   Medication Sig Start Date End Date Taking? Authorizing Provider  clopidogrel (PLAVIX) 75 MG tablet Take 1 tablet (75 mg total) by mouth daily with breakfast. 08/15/13  Yes [name], MD  divalproex (DEPAKOTE ER) 500 MG 24 hr tablet Take 1 tablet (500 mg total) by mouth at bedtime. 07/14/16  Yes Micki Riley, MD  furosemide (LASIX) 20 MG tablet Take 20 mg by mouth daily as needed for fluid.   Yes [provider]  guaiFENesin (MUCINEX) 600 MG 12 hr tablet Take 600 mg by mouth 2 (two) times daily as needed for to loosen phlegm.   Yes [provider]  Melatonin 5 MG TABS Take 5 mg by mouth at bedtime as needed (sleep).   Yes [provider]  memantine (NAMENDA XR) 14 MG CP24 24 hr capsule Take 14 mg by mouth daily.   Yes [provider]  NYSTATIN powder Apply 1 application topically 2 (two) times daily as needed for rash. 05/04/17  Yes [provider]  pantoprazole (PROTONIX) 40 MG tablet Take 40 mg by mouth every morning.    Yes [provider]  protein supplement shake (PREMIER PROTEIN) LIQD Take 2 oz by mouth 4 (four) times daily as needed (meal replacement).  Yes [provider]  rosuvastatin (CRESTOR) 5 MG tablet Take 5 mg by mouth every Monday, Wednesday, and Friday.   Yes [provider]  sodium chloride 1 g tablet Take 1 g by mouth daily.   Yes [provider]  TOUJEO SOLOSTAR 300 UNIT/ML SOPN Inject 12 Units as directed every evening.  12/08/14  Yes [provider]  zinc oxide (BALMEX) 11.3 % CREA cream Apply 1 application topically daily.   Yes [provider]    Family History Family History    Problem Relation Age of Onset  . CAD Mother   . Hypertension Mother   . Diabetes Mother   . Stroke Mother     Social History Social History  Substance Use Topics  . Smoking status: Never Smoker  . Smokeless tobacco: Former Neurosurgeon    Types: Snuff     Comment: quit 50-60 years ago  . Alcohol use No     Allergies   Lipitor [atorvastatin]   Review of Systems Review of Systems  Constitutional: Negative for fever.  Genitourinary: Negative for dysuria and flank pain.  Musculoskeletal: Negative for neck stiffness.  Neurological: Negative for seizures.  Psychiatric/Behavioral: Positive for confusion. Negative for agitation, hallucinations and self-injury.  All other systems reviewed and are negative.    Physical Exam Updated Vital Signs BP (!) 107/43   Pulse 70   Temp 99.6 F (37.6 C) (Rectal)   Resp 18   Ht 5\' 6"  (1.676 m)   Wt 75.8 kg (167 lb)   SpO2 97%   BMI 26.95 kg/m   Physical Exam  Constitutional: She appears well-developed and well-nourished. No distress.  HENT:  Head: Normocephalic and atraumatic.  Mouth/Throat: Oropharynx is clear and moist. No oropharyngeal exudate.  Eyes: Pupils are equal, round, and reactive to light. Conjunctivae are normal.  Neck: Normal range of motion. Neck supple. No JVD present.  Cardiovascular: Normal rate, regular rhythm, normal heart sounds and intact distal pulses.   Pulmonary/Chest: Effort normal and breath sounds normal. No respiratory distress. She has no wheezes. She has no rales.  Abdominal: Soft. Bowel sounds are normal. She exhibits no mass. There is no tenderness. There is no rebound and no guarding.  Musculoskeletal: She exhibits no tenderness.  Decubitus ulcer of the left heel, elephantiasis of the right foot with verrucas changes of the skin  Neurological: She displays normal reflexes. GCS eye subscore is 4. GCS verbal subscore is 1. GCS motor subscore is 5.  Skin: Skin is warm and dry. Capillary refill takes less  than 2 seconds.     ED Treatments / Results   Vitals:   06/18/17 0415 06/18/17 0430  BP: 110/71 (!) 107/43  Pulse: 72 70  Resp: 13 18  Temp:    SpO2: 97% 97%    Labs (all labs ordered are listed, but only abnormal results are displayed)  Results for orders placed or performed during the hospital encounter of 06/18/17  CBC with Differential/Platelet  Result Value Ref Range   WBC 10.2 4.0 - 10.5 K/uL   RBC 3.57 (L) 3.87 - 5.11 MIL/uL   Hemoglobin 10.2 (L) 12.0 - 15.0 g/dL   HCT 16.1 (L) 09.6 - 04.5 %   MCV 86.8 78.0 - 100.0 fL   MCH 28.6 26.0 - 34.0 pg   MCHC 32.9 30.0 - 36.0 g/dL   RDW 40.9 81.1 - 91.4 %   Platelets 221 150 - 400 K/uL   Neutrophils Relative % 53 %   Neutro Abs  5.4 1.7 - 7.7 K/uL   Lymphocytes Relative 34 %   Lymphs Abs 3.5 0.7 - 4.0 K/uL   Monocytes Relative 12 %   Monocytes Absolute 1.2 (H) 0.1 - 1.0 K/uL   Eosinophils Relative 1 %   Eosinophils Absolute 0.1 0.0 - 0.7 K/uL   Basophils Relative 0 %   Basophils Absolute 0.0 0.0 - 0.1 K/uL  Comprehensive metabolic panel  Result Value Ref Range   Sodium 129 (L) 135 - 145 mmol/L   Potassium 4.4 3.5 - 5.1 mmol/L   Chloride 98 (L) 101 - 111 mmol/L   CO2 22 22 - 32 mmol/L   Glucose, Bld 227 (H) 65 - 99 mg/dL   BUN 25 (H) 6 - 20 mg/dL   Creatinine, Ser 4.09 0.44 - 1.00 mg/dL   Calcium 9.3 8.9 - 81.1 mg/dL   Total Protein 6.2 (L) 6.5 - 8.1 g/dL   Albumin 2.4 (L) 3.5 - 5.0 g/dL   AST 18 15 - 41 U/L   ALT 9 (L) 14 - 54 U/L   Alkaline Phosphatase 86 38 - 126 U/L   Total Bilirubin 0.2 (L) 0.3 - 1.2 mg/dL   GFR calc non Af Amer 48 (L) >60 mL/min   GFR calc Af Amer 56 (L) >60 mL/min   Anion gap 9 5 - 15  Urinalysis, Routine w reflex microscopic  Result Value Ref Range   Color, Urine YELLOW YELLOW   APPearance CLEAR CLEAR   Specific Gravity, Urine 1.013 1.005 - 1.030   pH 7.0 5.0 - 8.0   Glucose, UA >=500 (A) NEGATIVE mg/dL   Hgb urine dipstick NEGATIVE NEGATIVE   Bilirubin Urine NEGATIVE NEGATIVE    Ketones, ur NEGATIVE NEGATIVE mg/dL   Protein, ur NEGATIVE NEGATIVE mg/dL   Nitrite NEGATIVE NEGATIVE   Leukocytes, UA NEGATIVE NEGATIVE   RBC / HPF 0-5 0 - 5 RBC/hpf   WBC, UA 0-5 0 - 5 WBC/hpf   Bacteria, UA NONE SEEN NONE SEEN   Squamous Epithelial / LPF NONE SEEN NONE SEEN   Hyaline Casts, UA PRESENT   Lactic acid, plasma  Result Value Ref Range   Lactic Acid, Venous 1.2 0.5 - 1.9 mmol/L  I-Stat Chem 8, ED  Result Value Ref Range   Sodium 132 (L) 135 - 145 mmol/L   Potassium 4.3 3.5 - 5.1 mmol/L   Chloride 100 (L) 101 - 111 mmol/L   BUN 26 (H) 6 - 20 mg/dL   Creatinine, Ser 9.14 0.44 - 1.00 mg/dL   Glucose, Bld 782 (H) 65 - 99 mg/dL   Calcium, Ion 9.56 1.15 - 1.40 mmol/L   TCO2 23 22 - 32 mmol/L   Hemoglobin 10.2 (L) 12.0 - 15.0 g/dL   HCT 21.3 (L) 08.6 - 57.8 %  I-stat troponin, ED  Result Value Ref Range   Troponin i, poc 0.00 0.00 - 0.08 ng/mL   Comment 3           Ct Head Wo Contrast  Result Date: 06/18/2017 CLINICAL DATA:  Acute onset of altered mental status. Confusion. Initial encounter. EXAM: CT HEAD WITHOUT CONTRAST TECHNIQUE: Contiguous axial images were obtained from the base of the skull through the vertex without intravenous contrast. COMPARISON:  MRI of the brain performed 05/15/2017, and CT of the head performed 05/12/2017 FINDINGS: Brain: No evidence of acute infarction, hemorrhage, hydrocephalus, extra-axial collection or mass lesion/mass effect. Prominence of the ventricles and sulci reflects moderate cortical volume loss. Scattered periventricular and subcortical white matter change  likely reflects small vessel ischemic microangiopathy. Chronic ischemic change is noted at the basal ganglia bilaterally. Cerebellar atrophy is noted. The brainstem and fourth ventricle are within normal limits. The cerebral hemispheres demonstrate grossly normal gray-white differentiation. No mass effect or midline shift is seen. Vascular: No hyperdense vessel or unexpected  calcification. Skull: There is no evidence of fracture; visualized osseous structures are unremarkable in appearance. Sinuses/Orbits: The orbits are within normal limits. The paranasal sinuses and mastoid air cells are well-aerated. Other: No significant soft tissue abnormalities are seen. IMPRESSION: 1. No acute intracranial pathology seen on CT. 2. Moderate cortical volume loss and scattered small ischemic microangiopathy. 3. Chronic ischemic change at the basal ganglia bilaterally. Electronically Signed   By: Roanna RaiderJeffery  Chang M.D.   On: 06/18/2017 01:58   Dg Chest Portable 1 View  Result Date: 06/18/2017 CLINICAL DATA:  Altered mental status. History of stroke, diabetes and hyperlipidemia. EXAM: PORTABLE CHEST 1 VIEW COMPARISON:  Chest radiograph May 20, 2017 FINDINGS: Cardiomediastinal silhouette is unchanged. Calcified aortic knob. Diffuse interstitial prominence confluent in the lung bases. Slight blunting of LEFT costophrenic angle. No pneumothorax. Osteopenia. Soft tissue planes are nonsuspicious. IMPRESSION: Similar chronic interstitial changes with bibasilar probable fibrosis. Trace LEFT pleural effusion versus pleural thickening. Electronically Signed   By: Awilda Metroourtnay  Bloomer M.D.   On: 06/18/2017 01:03     Radiology Ct Head Wo Contrast  Result Date: 06/18/2017 CLINICAL DATA:  Acute onset of altered mental status. Confusion. Initial encounter. EXAM: CT HEAD WITHOUT CONTRAST TECHNIQUE: Contiguous axial images were obtained from the base of the skull through the vertex without intravenous contrast. COMPARISON:  MRI of the brain performed 05/15/2017, and CT of the head performed 05/12/2017 FINDINGS: Brain: No evidence of acute infarction, hemorrhage, hydrocephalus, extra-axial collection or mass lesion/mass effect. Prominence of the ventricles and sulci reflects moderate cortical volume loss. Scattered periventricular and subcortical white matter change likely reflects small vessel ischemic  microangiopathy. Chronic ischemic change is noted at the basal ganglia bilaterally. Cerebellar atrophy is noted. The brainstem and fourth ventricle are within normal limits. The cerebral hemispheres demonstrate grossly normal gray-white differentiation. No mass effect or midline shift is seen. Vascular: No hyperdense vessel or unexpected calcification. Skull: There is no evidence of fracture; visualized osseous structures are unremarkable in appearance. Sinuses/Orbits: The orbits are within normal limits. The paranasal sinuses and mastoid air cells are well-aerated. Other: No significant soft tissue abnormalities are seen. IMPRESSION: 1. No acute intracranial pathology seen on CT. 2. Moderate cortical volume loss and scattered small ischemic microangiopathy. 3. Chronic ischemic change at the basal ganglia bilaterally. Electronically Signed   By: Roanna RaiderJeffery  Chang M.D.   On: 06/18/2017 01:58   Dg Chest Portable 1 View  Result Date: 06/18/2017 CLINICAL DATA:  Altered mental status. History of stroke, diabetes and hyperlipidemia. EXAM: PORTABLE CHEST 1 VIEW COMPARISON:  Chest radiograph May 20, 2017 FINDINGS: Cardiomediastinal silhouette is unchanged. Calcified aortic knob. Diffuse interstitial prominence confluent in the lung bases. Slight blunting of LEFT costophrenic angle. No pneumothorax. Osteopenia. Soft tissue planes are nonsuspicious. IMPRESSION: Similar chronic interstitial changes with bibasilar probable fibrosis. Trace LEFT pleural effusion versus pleural thickening. Electronically Signed   By: Awilda Metroourtnay  Bloomer M.D.   On: 06/18/2017 01:03    Procedures Procedures (including critical care time)  Medications Ordered in ED Medications  feeding supplement (ENSURE ENLIVE) (ENSURE ENLIVE) liquid 237 mL (not administered)  divalproex (DEPAKOTE ER) 24 hr tablet 500 mg (not administered)  guaiFENesin (MUCINEX) 12 hr tablet 600 mg (not administered)  memantine (NAMENDA XR) 24 hr capsule 28 mg (not  administered)  clopidogrel (PLAVIX) tablet 75 mg (not administered)  pantoprazole (PROTONIX) EC tablet 40 mg (not administered)  rosuvastatin (CRESTOR) tablet 5 mg (not administered)  enoxaparin (LOVENOX) injection 40 mg (not administered)  sodium chloride flush (NS) 0.9 % injection 3 mL (not administered)  0.9 %  sodium chloride infusion (not administered)  acetaminophen (TYLENOL) tablet 650 mg (not administered)    Or  acetaminophen (TYLENOL) suppository 650 mg (not administered)  HYDROcodone-acetaminophen (NORCO/VICODIN) 5-325 MG per tablet 1-2 tablet (not administered)  ondansetron (ZOFRAN) tablet 4 mg (not administered)    Or  ondansetron (ZOFRAN) injection 4 mg (not administered)  senna-docusate (Senokot-S) tablet 1 tablet (not administered)  bisacodyl (DULCOLAX) EC tablet 5 mg (not administered)  cefTRIAXone (ROCEPHIN) 1 g in dextrose 5 % 50 mL IVPB (not administered)  insulin glargine (LANTUS) injection 9 Units (not administered)  insulin aspart (novoLOG) injection 0-9 Units (not administered)  sodium chloride 0.9 % bolus 500 mL (0 mLs Intravenous Stopped 06/18/17 0414)  vancomycin (VANCOCIN) IVPB 1000 mg/200 mL premix (0 mg Intravenous Stopped 06/18/17 0539)  piperacillin-tazobactam (ZOSYN) IVPB 3.375 g (0 g Intravenous Stopped 06/18/17 0525)      Final Clinical Impressions(s) / ED Diagnoses   Final diagnoses:  Dehydration  Decubitus ulcer of sacral region, stage 2  Idiopathic hypotension    Admit    Bard Haupert, MD 06/18/17 4098

## 2017-06-18 NOTE — Consult Note (Signed)
WOC Nurse wound consult note Reason for Consult:Pressure injuries to buttocks.Daughter reports these injuries (heels and buttocks) were sustained during [facility]. Wound type:Pressure, friction, moisture Pressure Injury POA: Yes, bilateral buttocks (R>L), bilateral heels Measurement: Right buttock 5cm x 4cm area of remodeling (healing) tissue loss with a deep red open center measuring 3cm x 2cm x 0.1cm. Scant serosanguinous exudate due to friction. Left buttock: 4.5cm x 3.5cm x 0.1cm Stage 3 pressure injury that is nearly reepithelialized. Tissue is pink, skin level. No exudate Right medial heel: 2cm x 2.5cm x 0.1cm resolving full thickness (stage 3) pressure injury with granulation tissue evident, also contraction at periphery.no exudate. Left heel:  Unstageable 3.5cm x 5cm with purple/black discoloration, appears to have been at one time a blood-filled blister. Unable to measure depth. No exudate. Wound bed:As described above Drainage (amount, consistency, odor) As described above Periwound:intact, dry on feet, slight maceration noted to buttocks.  Dressing procedure/placement/frequency: Patient is on a mattress replacement with low air loss feature in the ICU, will provide orders for this sleep surface to continue when transferred to floor. Patient has an external powered urinary incontinence collection device in place (PurWick) and the maceration to the medial thighs, buttocks and perineal area is improving according to her daughter.  We will provide direction for topical care of the bilateral heels along with Prevalon Pressure Redistribution Heel Boots today. Topical care of the buttock lesions will be with Gerhart's Butt Cream, a 1:1:1 compounded preparation of zinc oxide, hydrocortisone cream and lotrimin cream cream applied three times daily. Turning and repositioning will be from side to side with time spent in the supine position minimized. WOC  nursing team will not follow, but will remain available to this patient, the nursing and medical teams.  Please re-consult if needed. Thanks, Ladona MowLaurie Lazlo Tunney, MSN, RN, GNP, Hans EdenCWOCN, CWON-AP, FAAN  Pager# [phone]

## 2017-06-18 NOTE — Progress Notes (Addendum)
[place] TEAM 1 - Stepdown/ICU TEAM  Omar Personuby Luba  UJW:[id number] DOB: December 13, 1921 DOA: 06/18/2017 PCP: [name], John, MD    Brief Narrative:  83 y.o. female with a history of advanced dementia, stroke with right-sided hemiparesis, seizure disorder, chronic hyponatremia, sacral pressure ulcer, and DM2 who presented to the ED w/ confusion and lethargy.  Patient was admitted to the hospital 1 month prior under similar circumstances and had a negative MRI and EEG and her condition was felt to possibly be secondary tp hyponatremia (which improved prior to discharge).  She was d/c to a SNF where she remained until 1 week prior to this admission when she returned home.  Since that time, she had been less active and more lethargic than she was prior to the prior hospitalization.  This worsened to the point the patient was not eating or drinking and not interacting.   In the ED Head CT was negative for acute intracranial abnormality.  Chemistry panel revealed a sodium of 129, BUN of 25, creatinine of 0.98.  CBC features a slight leukocytosis to 10,200 and a stable normocytic anemia with hemoglobin of 10.2.  Urinalysis was notable for glucosuria.   Subjective: Pt is seen for a f/u visit.    Assessment & Plan:  Acute encephalopathy on baseline of severe dementia  Hypotension  Decubitus ulcers - heels and B buttocks Care as per WOC RN   Chronic hyponatremia  Seizure disorder  Chronic grade 2 Diastolic CHF   History of CVA  DM 2  Chronic anemia  DVT prophylaxis: Lovenox Code Status: DNR - NO CODE Family Communication: Spoke with daughter at bedside Disposition Plan: SDU  Consultants:  None  Procedures: None  Antimicrobials:  Rocephin 10/27 >   Objective: Blood pressure (!) 152/54, pulse 77, temperature 97.9 F (36.6 C), temperature source Oral, resp. rate 15, height 5\' 2"  (1.575 m), weight 66.9 kg (147 lb 7.8 oz), SpO2 99 %.  Intake/Output Summary (Last 24 hours) at 06/18/17  1141 Last data filed at 06/18/17 0054  Gross per 24 hour  Intake                0 ml  Output              450 ml  Net             -450 ml   Filed Weights   06/18/17 0131 06/18/17 0811 06/18/17 0820  Weight: 75.8 kg (167 lb) 66.9 kg (147 lb 7.8 oz) 66.9 kg (147 lb 7.8 oz)    Examination: Pt was seen for a f/u visit.    CBC:  Recent Labs Lab 06/18/17 0050 06/18/17 0112 06/18/17 0542  WBC 10.2  --  10.2  NEUTROABS 5.4  --  5.2  HGB 10.2* 10.2* 9.7*  HCT 31.0* 30.0* 30.2*  MCV 86.8  --  86.0  PLT 221  --  222   Basic Metabolic Panel:  Recent Labs Lab 06/18/17 0050 06/18/17 0112 06/18/17 0542  NA 129* 132* 131*  K 4.4 4.3 4.3  CL 98* 100* 101  CO2 22  --  22  GLUCOSE 227* 229* 231*  BUN 25* 26* 20  CREATININE 0.98 1.00 0.76  CALCIUM 9.3  --  8.9   GFR: Estimated Creatinine Clearance: 39.4 mL/min (by C-G formula based on SCr of 0.76 mg/dL).  Liver Function Tests:  Recent Labs Lab 06/18/17 0050  AST 18  ALT 9*  ALKPHOS 86  BILITOT 0.2*  PROT 6.2*  ALBUMIN  2.4*    Recent Labs Lab 06/18/17 0542  AMMONIA 35    HbA1C: Hgb A1c MFr Bld  Date/Time Value Ref Range Status  05/12/2017 07:40 PM 7.1 (H) 4.8 - 5.6 % Final    Comment:    (NOTE) Pre diabetes:          5.7%-6.4% Diabetes:              >6.4% Glycemic control for   <7.0% adults with diabetes   10/23/2014 09:31 AM 8.3 (H) 4.8 - 5.6 % Final    Comment:    (NOTE)         Pre-diabetes: 5.7 - 6.4         Diabetes: >6.4         Glycemic control for adults with diabetes: <7.0     CBG:  Recent Labs Lab 06/18/17 0842  GLUCAP 194*    Scheduled Meds: . clopidogrel  75 mg Oral Q breakfast  . divalproex  500 mg Oral QHS  . enoxaparin (LOVENOX) injection  40 mg Subcutaneous Daily  . feeding supplement (ENSURE ENLIVE)  237 mL Oral BID BM  . Gerhardt's butt cream   Topical TID  . insulin aspart  0-9 Units Subcutaneous Q4H  . insulin glargine  9 Units Subcutaneous QHS  . memantine  14 mg  Oral Daily  . pantoprazole  40 mg Oral q morning - 10a  . rosuvastatin  5 mg Oral q1800  . sodium chloride flush  3 mL Intravenous Q12H   Continuous Infusions: . sodium chloride    . cefTRIAXone (ROCEPHIN)  IV Stopped (06/18/17 4098)     LOS: 0 days   Time spent: No Charge  Lonia Blood, MD [facility] Office  [phone] Pager - Text Page per Amion as per below:  On-Call/Text Page:      [email or web address]      password [email or web address]  If 7PM-7AM, please contact night-coverage [email or web address] Password [email or web address] 06/18/2017, 11:41 AM

## 2017-06-18 NOTE — H&P (Signed)
History and Physical    Megan [name] WUJ:[id number] DOB: 1922-07-16 DOA: 06/18/2017  PCP: Megan Funk, MD   Patient coming from: Home  Chief Complaint: Confusion, lethargy   HPI: Megan [name] is a 83 y.o. female with medical history significant for advanced dementia, history of stroke with right-sided hemiparesis, seizure disorder, chronic hyponatremia, sacral pressure ulcer, and type 2 diabetes mellitus, now presenting to the emergency department for evaluation of confusion and lethargy.  Patient is accompanied by her daughter who assists with the history.  Patient had been admitted to the hospital approximately 1 month ago under similar circumstances, had a negative MRI and EEG and her condition was felt to possibly be secondary to her hyponatremia which improved prior to discharge.  She was at an SNF until 1 week ago when she returned home.  Since that time, she has been less active and more lethargic than she was prior to the hospitalization a month ago, but this has seemed to worsen over the past couple days to the point where the patient is not eating or drinking and not interacting much at all.  Patient has not expressed any complaints.  There was no fall or trauma and she was not cough or diarrhea.  No seizure activity observed.   ED Course: Upon arrival to the ED, patient is found to be afebrile, saturating adequately on 2 L/min of supplemental oxygen, blood pressure 92/35, and vitals otherwise stable.  EKG features a sinus rhythm with PAC.  Noncontrast head CT is negative for acute intracranial abnormality.  Is similar to prior with chronic interstitial changes and bibasilar probable fibrosis and trace left pleural effusion versus pleural thickening.  Chemistry panel reveals a sodium of 129, BUN of 25, creatinine of 0.98.  CBC features a slight leukocytosis to 10,200 and a stable normocytic anemia with hemoglobin of 10.2.  Troponin is undetectable and urinalysis is notable for glucosuria.  Blood  cultures were collected, 500 cc of normal saline was administered, and the patient was treated with empiric vancomycin and Zosyn.  Blood pressure improved, and MAP is now fluctuating from 60-80.  She is not in any apparent distress.  She will be admitted to the stepdown unit for ongoing evaluation and management of acute encephalopathy and hypotension of uncertain etiology.  Review of Systems:  Unable to complete ROS secondary to patient's condition as described above.  Past Medical History:  Diagnosis Date  . Diabetes mellitus   . Hyperlipidemia   . Seizure (HCC)   . Stroke [facility]) 2012   possible stroke Jan 2016    Past Surgical History:  Procedure Laterality Date  . CATARACT EXTRACTION       reports that she has never smoked. She has quit using smokeless tobacco. Her smokeless tobacco use included Snuff. She reports that she does not drink alcohol or use drugs.  Allergies  Allergen Reactions  . Lipitor [Atorvastatin] Other (See Comments)    Myalgias     Family History  Problem Relation Age of Onset  . CAD Mother   . Hypertension Mother   . Diabetes Mother   . Stroke Mother      Prior to Admission medications   Medication Sig Start Date End Date Taking? Authorizing Provider  clopidogrel (PLAVIX) 75 MG tablet Take 1 tablet (75 mg total) by mouth daily with breakfast. 08/15/13   Megan Funk, MD  divalproex (DEPAKOTE ER) 500 MG 24 hr tablet Take 1 tablet (500 mg total) by mouth at bedtime. 07/14/16   Pearlean Brownie,  Jason Fila, MD  feeding supplement, ENSURE ENLIVE, (ENSURE ENLIVE) LIQD Take 237 mLs by mouth 2 (two) times daily between meals. 05/21/17   Haydee Salter, MD  guaiFENesin (MUCINEX) 600 MG 12 hr tablet Take 600 mg by mouth 2 (two) times daily as needed for to loosen phlegm.    [provider]  memantine (NAMENDA XR) 28 MG CP24 24 hr capsule Take 1 capsule (28 mg total) by mouth daily. 12/09/15   Micki Riley, MD  NYSTATIN powder Apply 1 application topically 2  (two) times daily as needed for rash. 05/04/17   [provider]  pantoprazole (PROTONIX) 40 MG tablet Take 40 mg by mouth every morning.     [provider]  rosuvastatin (CRESTOR) 5 MG tablet Take 5 mg by mouth every Monday, Wednesday, and Friday.    [provider]  TOUJEO SOLOSTAR 300 UNIT/ML SOPN Inject 9 Units as directed every evening.  12/08/14   [provider]  zinc oxide (BALMEX) 11.3 % CREA cream Apply 1 application topically daily.    [provider]    Physical Exam: Vitals:   06/18/17 0315 06/18/17 0345 06/18/17 0415 06/18/17 0430  BP: (!) 104/49 (!) 84/48 110/71 (!) 107/43  Pulse: 71 65 72 70  Resp: 18 19 13 18   Temp:      TempSrc:      SpO2: 99% 96% 97% 97%  Weight:      Height:          Constitutional: No acute distress, lethargic, difficult to rouse Eyes: PERRL, lids and conjunctivae normal ENMT: Mucous membranes are dry Posterior pharynx clear of any exudate or lesions.   Neck: normal, supple, no masses, no thyromegaly Respiratory: clear to auscultation bilaterally, no wheezing. Normal respiratory effort.   Cardiovascular: S1 & S2 heard, regular rate and rhythm. No significant JVD. Abdomen: No distension, no tenderness, soft. Bowel sounds active.  Musculoskeletal: no clubbing / cyanosis. No joint deformity upper and lower extremities.   Skin: Heel and sacral ulcers, chronic lower leg dermatitis with verrucous changes. Poor turgor.  Neurologic: Gross facial droop. Right-sided hemiparesis. Lethargic and difficult to rouse.  Psychiatric: Unable to assess given the clinical scenario.     Labs on Admission: I have personally reviewed following labs and imaging studies  CBC:  Recent Labs Lab 06/18/17 0050 06/18/17 0112  WBC 10.2  --   NEUTROABS 5.4  --   HGB 10.2* 10.2*  HCT 31.0* 30.0*  MCV 86.8  --   PLT 221  --    Basic Metabolic Panel:  Recent Labs Lab 06/18/17 0050 06/18/17 0112  NA 129* 132*  K  4.4 4.3  CL 98* 100*  CO2 22  --   GLUCOSE 227* 229*  BUN 25* 26*  CREATININE 0.98 1.00  CALCIUM 9.3  --    GFR: Estimated Creatinine Clearance: 36.6 mL/min (by C-G formula based on SCr of 1 mg/dL). Liver Function Tests:  Recent Labs Lab 06/18/17 0050  AST 18  ALT 9*  ALKPHOS 86  BILITOT 0.2*  PROT 6.2*  ALBUMIN 2.4*   No results for input(s): LIPASE, AMYLASE in the last 168 hours. No results for input(s): AMMONIA in the last 168 hours. Coagulation Profile: No results for input(s): INR, PROTIME in the last 168 hours. Cardiac Enzymes: No results for input(s): CKTOTAL, CKMB, CKMBINDEX, TROPONINI in the last 168 hours. BNP (last 3 results) No results for input(s): PROBNP in the last 8760 hours. HbA1C: No results for input(s):  HGBA1C in the last 72 hours. CBG: No results for input(s): GLUCAP in the last 168 hours. Lipid Profile: No results for input(s): CHOL, HDL, LDLCALC, TRIG, CHOLHDL, LDLDIRECT in the last 72 hours. Thyroid Function Tests: No results for input(s): TSH, T4TOTAL, FREET4, T3FREE, THYROIDAB in the last 72 hours. Anemia Panel: No results for input(s): VITAMINB12, FOLATE, FERRITIN, TIBC, IRON, RETICCTPCT in the last 72 hours. Urine analysis:    Component Value Date/Time   COLORURINE YELLOW 06/18/2017 0055   APPEARANCEUR CLEAR 06/18/2017 0055   LABSPEC 1.013 06/18/2017 0055   PHURINE 7.0 06/18/2017 0055   GLUCOSEU >=500 (A) 06/18/2017 0055   HGBUR NEGATIVE 06/18/2017 0055   BILIRUBINUR NEGATIVE 06/18/2017 0055   KETONESUR NEGATIVE 06/18/2017 0055   PROTEINUR NEGATIVE 06/18/2017 0055   UROBILINOGEN 0.2 10/23/2014 0830   NITRITE NEGATIVE 06/18/2017 0055   LEUKOCYTESUR NEGATIVE 06/18/2017 0055   Sepsis Labs: @LABRCNTIP (procalcitonin:4,lacticidven:4) )No results found for this or any previous visit (from the past 240 hour(s)).   Radiological Exams on Admission: Ct Head Wo Contrast  Result Date: 06/18/2017 CLINICAL DATA:  Acute onset of altered  mental status. Confusion. Initial encounter. EXAM: CT HEAD WITHOUT CONTRAST TECHNIQUE: Contiguous axial images were obtained from the base of the skull through the vertex without intravenous contrast. COMPARISON:  MRI of the brain performed 05/15/2017, and CT of the head performed 05/12/2017 FINDINGS: Brain: No evidence of acute infarction, hemorrhage, hydrocephalus, extra-axial collection or mass lesion/mass effect. Prominence of the ventricles and sulci reflects moderate cortical volume loss. Scattered periventricular and subcortical white matter change likely reflects small vessel ischemic microangiopathy. Chronic ischemic change is noted at the basal ganglia bilaterally. Cerebellar atrophy is noted. The brainstem and fourth ventricle are within normal limits. The cerebral hemispheres demonstrate grossly normal gray-white differentiation. No mass effect or midline shift is seen. Vascular: No hyperdense vessel or unexpected calcification. Skull: There is no evidence of fracture; visualized osseous structures are unremarkable in appearance. Sinuses/Orbits: The orbits are within normal limits. The paranasal sinuses and mastoid air cells are well-aerated. Other: No significant soft tissue abnormalities are seen. IMPRESSION: 1. No acute intracranial pathology seen on CT. 2. Moderate cortical volume loss and scattered small ischemic microangiopathy. 3. Chronic ischemic change at the basal ganglia bilaterally. Electronically Signed   By: Roanna RaiderJeffery  Chang M.D.   On: 06/18/2017 01:58   Dg Chest Portable 1 View  Result Date: 06/18/2017 CLINICAL DATA:  Altered mental status. History of stroke, diabetes and hyperlipidemia. EXAM: PORTABLE CHEST 1 VIEW COMPARISON:  Chest radiograph May 20, 2017 FINDINGS: Cardiomediastinal silhouette is unchanged. Calcified aortic knob. Diffuse interstitial prominence confluent in the lung bases. Slight blunting of LEFT costophrenic angle. No pneumothorax. Osteopenia. Soft tissue  planes are nonsuspicious. IMPRESSION: Similar chronic interstitial changes with bibasilar probable fibrosis. Trace LEFT pleural effusion versus pleural thickening. Electronically Signed   By: Awilda Metroourtnay  Bloomer M.D.   On: 06/18/2017 01:03    EKG: Independently reviewed. Sinus rhythm, PAC.   Assessment/Plan  1. Acute encephalopathy  - Pt presents from home with lethargy, increased confusion  - Likely multifactorial and baseline difficult to determine  - She is hyponatremic, but this is chronic and appears stable  - She has advanced dementia and history of CVA, complicated assessment  - Possible infected decubitus ulcer will be treated empirically  - She may improve with fluid-resuscitation and improvement in BP  - Head CT without acute intracranial abnormality, but acute CVA difficult to exclude; had negative MRI and EEG during recent admission  - TSH recently  wnl, will check B12, folate, ammonia, and RPR    2. Hypotension  - Pt was hypotensive with EMS and given 500 cc NS PTA  - BP 92/35 in ED, improved with another 500 cc NS bolus  - She appears hypovolemic and has not been eating or drinking; will continue IVF hydration  - Monitor in SDU, bolus as needed    3. Dementia  - Follows with neurology, attributed to vascular and Lewy bodies  - Continue Namenda   4. Decubitus ulcers  - Pt has a heel ulcer and sacral ulcer  - Sacral ulcer may have some superimposed bacterial infection and she will be started on empiric Rocephin  - Wound care consulted    5. Hyponatremia - Serum sodium is 129 on admission  - This is chronic, attributed to SIADH and possibly Depakote  - Appears stable - Repeat chem panel in am   6. Seizure disorder  - No recent seizures observed  - Continue Depakote   7. Hx of CVA  - Pt has hx of CVA with right hemiparesis  - Head CT negative for acute intracranial abnormality  - Continue Plavix and Crestor    8. Anemia  - Hgb is stable at 10.2 and bleeding  evident   - Likely secondary to chronic disease   9. Type II DM  - A1c was 7.1% in September 2018  - Managed at home with Toujeo 9 units qHS  - Check CBG q4h, start Lantus 9 units qHS and Novolog correctional    DVT prophylaxis: Lovenox Code Status: DNR Family Communication: Daughter updated at bedside  Disposition Plan: Admit to SDU Consults called: None Admission status: Inpatient    Briscoe Deutscher, MD [facility] Pager [phone]  If 7PM-7AM, please contact night-coverage [email or web address] Password [email or web address]  06/18/2017, 5:03 AM

## 2017-06-18 NOTE — ED Notes (Signed)
Admitting doctor in to see 

## 2017-06-18 NOTE — ED Notes (Signed)
Zosyn partilly started  To infuse  I saw the blood culture order  Stopped the zosyn  Until 2nd blood culture drawn   Will restart

## 2017-06-18 NOTE — ED Notes (Signed)
Family went home

## 2017-06-18 NOTE — ED Notes (Signed)
Second blood culture drawn  Zosyn resumed

## 2017-06-19 LAB — COMPREHENSIVE METABOLIC PANEL
ALK PHOS: 74 U/L (ref 38–126)
ALT: 9 U/L — ABNORMAL LOW (ref 14–54)
ANION GAP: 9 (ref 5–15)
AST: 15 U/L (ref 15–41)
Albumin: 2.2 g/dL — ABNORMAL LOW (ref 3.5–5.0)
BUN: 21 mg/dL — ABNORMAL HIGH (ref 6–20)
CALCIUM: 9.1 mg/dL (ref 8.9–10.3)
CO2: 22 mmol/L (ref 22–32)
Chloride: 103 mmol/L (ref 101–111)
Creatinine, Ser: 0.81 mg/dL (ref 0.44–1.00)
GFR calc non Af Amer: >60 mL/min (ref >60)
Glucose, Bld: 161 mg/dL — ABNORMAL HIGH (ref 65–99)
POTASSIUM: 4.3 mmol/L (ref 3.5–5.1)
SODIUM: 134 mmol/L — AB (ref 135–145)
Total Bilirubin: 0.5 mg/dL (ref 0.3–1.2)
Total Protein: 6.2 g/dL — ABNORMAL LOW (ref 6.5–8.1)

## 2017-06-19 LAB — CBC
HCT: 32.3 % — ABNORMAL LOW (ref 36.0–46.0)
HEMOGLOBIN: 10.3 g/dL — AB (ref 12.0–15.0)
MCH: 27.7 pg (ref 26.0–34.0)
MCHC: 31.9 g/dL (ref 30.0–36.0)
MCV: 86.8 fL (ref 78.0–100.0)
Platelets: 220 10*3/uL (ref 150–400)
RBC: 3.72 MIL/uL — AB (ref 3.87–5.11)
RDW: 14.8 % (ref 11.5–15.5)
WBC: 10 10*3/uL (ref 4.0–10.5)

## 2017-06-19 LAB — GLUCOSE, CAPILLARY
GLUCOSE-CAPILLARY: 141 mg/dL — AB (ref 65–99)
GLUCOSE-CAPILLARY: 199 mg/dL — AB (ref 65–99)
Glucose-Capillary: 237 mg/dL — ABNORMAL HIGH (ref 65–99)
Glucose-Capillary: 180 mg/dL — ABNORMAL HIGH (ref 65–99)

## 2017-06-19 LAB — MAGNESIUM: MAGNESIUM: 2 mg/dL (ref 1.7–2.4)

## 2017-06-19 MED ORDER — DEXTROSE 5 % IV SOLN
500.0000 mg | Freq: Every day | INTRAVENOUS | Status: DC
  Filled 2017-06-19: qty 5

## 2017-06-19 MED ORDER — VALPROATE SODIUM 500 MG/5ML IV SOLN
250.0000 mg | Freq: Two times a day (BID) | INTRAVENOUS | Status: DC
  Administered 2017-06-20 (×2): 250 mg via INTRAVENOUS
  Filled 2017-06-19 (×3): qty 2.5

## 2017-06-19 MED ORDER — TRAMADOL HCL 50 MG PO TABS: 50.0000 mg | ORAL_TABLET | Freq: Four times a day (QID) | ORAL | Status: DC | PRN

## 2017-06-19 MED ORDER — DIVALPROEX SODIUM ER 500 MG PO TB24
500.0000 mg | ORAL_TABLET | Freq: Every day | ORAL | Status: DC
  Filled 2017-06-19 (×2): qty 1

## 2017-06-19 MED ORDER — HYDROCODONE-ACETAMINOPHEN 5-325 MG PO TABS: 1.0000 | ORAL_TABLET | ORAL | Status: DC | PRN

## 2017-06-19 NOTE — Progress Notes (Signed)
Pt. Arrived to the unit alert to self only. Disoriented to place, time, and situation. Pt. Not following commands. Pt. Family at  Bedside. Oriented to the equipment in the room and alarm is on bed.

## 2017-06-19 NOTE — Progress Notes (Addendum)
[place] TEAM 1 - Stepdown/ICU TEAM  [name]  ZOX:[id number] DOB: [birth year] DOA: 06/18/2017 PCP: [name], MD    Brief Narrative:  83 y.o. female with a history of advanced dementia, stroke with right-sided hemiparesis, seizure disorder, chronic hyponatremia, sacral pressure ulcer, and DM2 who presented to the ED w/ confusion and lethargy.  Patient was admitted to the hospital 1 month prior under similar circumstances and had a negative MRI and EEG and her condition was felt to possibly be secondary to hyponatremia (which improved prior to discharge).  She was d/c to a SNF where she remained until 1 week prior to this admission when she returned home.  Since that time, she had been less active and more lethargic than she was before the prior hospitalization.  This worsened to the point the patient was not eating or drinking and not interacting.   In the ED Head CT was negative for acute intracranial abnormality.  Chemistry panel revealed a sodium of 129, BUN of 25, creatinine of 0.98.  CBC features a slight leukocytosis to 10,200 and a stable normocytic anemia with hemoglobin of 10.2.  Urinalysis was notable for glucosuria.   Subjective: The pt is more alert and closer to her baseline, but is not yet consistently tolerating oral intake.  She denies any pain, but is confused.  She does not appear to be in acute resp distress or uncontrolled pain.    Assessment & Plan:  Acute encephalopathy on baseline of severe dementia B12 is not low - ammonia is normal - RPR non-reactive - I suspect this is due to signif DH and acute on chronic hyponatremia - appears to be slowly improving   Hypotension Resolved w/ volume expansion - follow trend while - cont IVF until oral intake more consistent   Decubitus ulcers - heels and B buttocks Care as per WOC RN - cont empiric abx coverage from planned 7 days of tx   Chronic hyponatremia Na has improved signif w/ simple volume expansion - cont same  and follow trend   Seizure disorder Appears controlled at this time   Chronic grade 2 Diastolic CHF  No evidence of overload at this time - follow w/ ongoing volume expansion - will need to avoid scheduled diuretic as outpt unless intake becomes much more predictable/consistent   History of CVA  DM 2 CBG has been quite elevated - tx has been adjusted - cont to follow trend   Chronic anemia  DVT prophylaxis: Lovenox Code Status: DNR - NO CODE Family Communication: Spoke with daughter at bedside at length  Disposition Plan: transfer to med surg bed - PT/OT - daughter wishes to take her back home but will need significantly more help to do so   Consultants:  None  Procedures: None  Antimicrobials:  Rocephin 10/27 >   Objective: Blood pressure (!) 97/41, pulse 74, temperature 98.9 F (37.2 C), temperature source Oral, resp. rate 14, height 5\' 2"  (1.575 m), weight 66.9 kg (147 lb 7.8 oz), SpO2 95 %.  Intake/Output Summary (Last 24 hours) at 06/19/17 1348 Last data filed at 06/19/17 0900  Gross per 24 hour  Intake           912.17 ml  Output             1050 ml  Net          -137.83 ml   Filed Weights   06/18/17 0131 06/18/17 0811 06/18/17 0820  Weight: 75.8 kg (167 lb) 66.9 kg (  147 lb 7.8 oz) 66.9 kg (147 lb 7.8 oz)    Examination: General: No acute respiratory distress Lungs: Clear to auscultation bilaterally without wheezes or crackles Cardiovascular: Regular rate and rhythm without murmur gallop or rub normal S1 and S2 Abdomen: Nontender, nondistended, soft, bowel sounds positive, no rebound, no ascites, no appreciable mass Extremities: No significant cyanosis, clubbing, or edema bilateral lower extremities   CBC:  Recent Labs Lab 06/18/17 0050 06/18/17 0112 06/18/17 0542 06/19/17 0251  WBC 10.2  --  10.2 10.0  NEUTROABS 5.4  --  5.2  --   HGB 10.2* 10.2* 9.7* 10.3*  HCT 31.0* 30.0* 30.2* 32.3*  MCV 86.8  --  86.0 86.8  PLT 221  --  222 220   Basic  Metabolic Panel:  Recent Labs Lab 06/18/17 0050 06/18/17 0112 06/18/17 0542 06/19/17 0251  NA 129* 132* 131* 134*  K 4.4 4.3 4.3 4.3  CL 98* 100* 101 103  CO2 22  --  22 22  GLUCOSE 227* 229* 231* 161*  BUN 25* 26* 20 21*  CREATININE 0.98 1.00 0.76 0.81  CALCIUM 9.3  --  8.9 9.1  MG  --   --   --  2.0   GFR: Estimated Creatinine Clearance: 38.9 mL/min (by C-G formula based on SCr of 0.81 mg/dL).  Liver Function Tests:  Recent Labs Lab 06/18/17 0050 06/19/17 0251  AST 18 15  ALT 9* 9*  ALKPHOS 86 74  BILITOT 0.2* 0.5  PROT 6.2* 6.2*  ALBUMIN 2.4* 2.2*    Recent Labs Lab 06/18/17 0542  AMMONIA 35    HbA1C: Hgb A1c MFr Bld  Date/Time Value Ref Range Status  05/12/2017 07:40 PM 7.1 (H) 4.8 - 5.6 % Final    Comment:    (NOTE) Pre diabetes:          5.7%-6.4% Diabetes:              >6.4% Glycemic control for   <7.0% adults with diabetes   10/23/2014 09:31 AM 8.3 (H) 4.8 - 5.6 % Final    Comment:    (NOTE)         Pre-diabetes: 5.7 - 6.4         Diabetes: >6.4         Glycemic control for adults with diabetes: <7.0     CBG:  Recent Labs Lab 06/18/17 1609 06/18/17 1945 06/18/17 2227 06/19/17 0809 06/19/17 1201  GLUCAP 309* 339* 230* 141* 237*    Scheduled Meds: . clopidogrel  75 mg Oral Q breakfast  . divalproex  500 mg Oral QHS  . enoxaparin (LOVENOX) injection  40 mg Subcutaneous Q24H  . feeding supplement (ENSURE ENLIVE)  237 mL Oral BID BM  . Gerhardt's butt cream   Topical TID  . insulin aspart  0-15 Units Subcutaneous TID WC  . insulin aspart  0-5 Units Subcutaneous QHS  . insulin glargine  12 Units Subcutaneous QHS  . mouth rinse  15 mL Mouth Rinse BID  . memantine  14 mg Oral Daily  . pantoprazole  40 mg Oral q morning - 10a  . sodium chloride flush  3 mL Intravenous Q12H     LOS: 1 day   Lonia BloodJeffrey T. Yichen Gilardi, MD [facility] Office  [phone] Pager - Text Page per Amion as per below:  On-Call/Text Page:       [email or web address]      password [email or web address]  If 7PM-7AM, please contact night-coverage [email or web address] Password [email or web address] 06/19/2017, 1:48 PM

## 2017-06-20 LAB — GLUCOSE, CAPILLARY
GLUCOSE-CAPILLARY: 133 mg/dL — AB (ref 65–99)
GLUCOSE-CAPILLARY: 212 mg/dL — AB (ref 65–99)
Glucose-Capillary: 204 mg/dL — ABNORMAL HIGH (ref 65–99)
Glucose-Capillary: 176 mg/dL — ABNORMAL HIGH (ref 65–99)

## 2017-06-20 LAB — COMPREHENSIVE METABOLIC PANEL
ALBUMIN: 2.1 g/dL — AB (ref 3.5–5.0)
ALT: 9 U/L — ABNORMAL LOW (ref 14–54)
ANION GAP: 8 (ref 5–15)
AST: 16 U/L (ref 15–41)
Alkaline Phosphatase: 81 U/L (ref 38–126)
BUN: 15 mg/dL (ref 6–20)
CALCIUM: 9.1 mg/dL (ref 8.9–10.3)
CHLORIDE: 104 mmol/L (ref 101–111)
CO2: 23 mmol/L (ref 22–32)
Creatinine, Ser: 0.77 mg/dL (ref 0.44–1.00)
GFR calc non Af Amer: >60 mL/min (ref >60)
GLUCOSE: 241 mg/dL — AB (ref 65–99)
POTASSIUM: 4.4 mmol/L (ref 3.5–5.1)
SODIUM: 135 mmol/L (ref 135–145)
Total Bilirubin: 0.3 mg/dL (ref 0.3–1.2)
Total Protein: 6.5 g/dL (ref 6.5–8.1)

## 2017-06-20 LAB — FOLATE RBC
FOLATE, RBC: 1227 ng/mL (ref >498)
Folate, Hemolysate: 377.8 ng/mL
Hematocrit: 30.8 % — ABNORMAL LOW (ref 34.0–46.6)

## 2017-06-20 LAB — CBC
HEMATOCRIT: 30.6 % — AB (ref 36.0–46.0)
HEMOGLOBIN: 10.1 g/dL — AB (ref 12.0–15.0)
MCH: 28.8 pg (ref 26.0–34.0)
MCHC: 33 g/dL (ref 30.0–36.0)
MCV: 87.2 fL (ref 78.0–100.0)
Platelets: 231 10*3/uL (ref 150–400)
RBC: 3.51 MIL/uL — AB (ref 3.87–5.11)
RDW: 15.6 % — ABNORMAL HIGH (ref 11.5–15.5)
WBC: 7.4 10*3/uL (ref 4.0–10.5)

## 2017-06-20 MED ORDER — VALPROIC ACID 250 MG PO CAPS
250.0000 mg | ORAL_CAPSULE | Freq: Two times a day (BID) | ORAL | Status: DC
  Administered 2017-06-20 – 2017-06-21 (×2): 250 mg via ORAL
  Filled 2017-06-20 (×2): qty 1

## 2017-06-20 NOTE — Evaluation (Signed)
Physical Therapy Evaluation Patient Details Name: Megan [name] MRN: [id number] DOB: 14-Jul-1922 Today's Date: 06/20/2017   History of Present Illness  Pt is a 83 y.o.femalewith medical history significant for advanced dementia, stroke with right-sided hemiparesis, seizure, chronic hyponatremia, bilateral heel and bilateral buttock pressure injury, and type 2 diabetes mellitus, now presenting to the emergency department for evaluation of confusion and lethargy. CT showing no acute intracarnial pathology. Awating MRI.   Clinical Impression  Pt admitted with above diagnosis. Pt currently with functional limitations due to the deficits listed below (see PT Problem List). At the time of PT eval, +2 assist was required for all aspects of functional mobility. Pt was able to sit EOB with as little as min guard assist, however fatigued quickly and required min assist to maintain midline. Pt's daughter/primary caregiver present in room and reports that she would have to pay out of pocket for another SNF stay as the patient has already exceeded the 21 days covered by Medicare. Because of this, daughter prefers to have the pt home at d/c. We discussed the option of a Hoyer lift to assist in OOB transfers. Daughter appeared agreeable to this. If it turns out that Medicare will cover another SNF stay, feel this is the safest d/c option as she is requiring a significant amount of assistance at this time for all aspects of mobility/ADL's. If the plan remains to return home, she will need the equipment listed below, an ambulance transfer home, and HHPT services to follow. Acutely, pt will benefit from skilled PT to increase their independence and safety with mobility to allow discharge to the venue listed below.       Follow Up Recommendations Home health PT;Supervision/Assistance - 24 hour    Equipment Recommendations  Hospital bed;Other (comment) (Pressure relief cushion, Nurse, adult)    Recommendations for [employer]       Precautions / Restrictions Precautions Precautions: Fall Restrictions Weight Bearing Restrictions: No      Mobility  Bed Mobility Overal bed mobility: Needs Assistance Bed Mobility: Supine to Sit;Sit to Supine     Supine to sit: Max assist;+2 for physical assistance Sit to supine: Total assist;+2 for physical assistance   General bed mobility comments: Total assist required for all LE movement. Pt initiating trunk flexion during supine>sit however +2 max assist required to achieve full sitting position. Pt requiring total a for sit>supine  Transfers                 General transfer comment: As no active movement noted throughout session, did not feel safe attempting OOB mobility at this time.   Ambulation/Gait             General Gait Details: Unable  Stairs            Wheelchair Mobility    Modified Rankin (Stroke Patients Only)       Balance Overall balance assessment: Needs assistance Sitting-balance support: No upper extremity supported;Feet supported Sitting balance-Leahy Scale: Fair Sitting balance - Comments: Able to maintain sitting balance with slight lateral lean to R. Once pt began to fatigue, she required Min A to correct sitting posture and balance.  (EOB ~10 minutes) Postural control: Right lateral lean;Left lateral lean Standing balance support: Single extremity supported                                 Pertinent Vitals/Pain Pain Assessment: Faces Faces Pain Scale:  No hurt Pain Intervention(s): Monitored during session    Home Living Family/patient expects to be discharged to:: Private residence Living Arrangements: Children Available Help at Discharge: Family;Available 24 hours/day Type of Home: House Home Access: Level entry     Home Layout: One level Home Equipment: Bedside commode;Wheelchair - [employer] - 2 wheels;Cane - single point Arts administrator)      Prior Function Level of  Independence: Needs assistance   Gait / Transfers Assistance Needed: Daughter assists with transfers to [facility] and [facility]  ADL's / Homemaking Assistance Needed: Daughter performing ADLs and encourages pt to particiapte in simple tasks such as self feeding and grooming        Hand Dominance   Dominant Hand: Right    Extremity/Trunk Assessment   Upper Extremity Assessment Upper Extremity Assessment: Defer to OT evaluation RUE Deficits / Details: Little AAROM during grasp; prior stroke impacting R side and pt doe not use R hand often LUE Deficits / Details: AROM to grasp with Max visual, verbal, and tactile cues    Lower Extremity Assessment Lower Extremity Assessment: Generalized weakness (No active movement noted throughout session bilaterally)    Cervical / Trunk Assessment Cervical / Trunk Assessment: Other exceptions Cervical / Trunk Exceptions: Noted significant forward head posture with rounded shoulders in sitting  Communication   Communication: Expressive difficulties;HOH  Cognition Arousal/Alertness: Awake/alert Behavior During Therapy: Flat affect Overall Cognitive Status: History of cognitive impairments - at baseline                                 General Comments: Diagnosis of dementia. Pt nonverbal throughout session however did attempt to vocalize once in response to a question.       General Comments General comments (skin integrity, edema, etc.): Daughter present throughout session    Exercises     Assessment/Plan    PT Assessment Patient needs continued PT services  PT Problem List Decreased strength;Decreased range of motion;Decreased activity tolerance;Decreased balance;Decreased mobility;Decreased knowledge of use of DME;Decreased safety awareness;Decreased knowledge of precautions;Decreased cognition       PT Treatment Interventions DME instruction;Gait training;Stair training;Functional mobility training;Therapeutic  activities;Therapeutic exercise;Neuromuscular re-education;Patient/family education    PT Goals (Current goals can be found in the Care Plan section)  Acute Rehab PT Goals Patient Stated Goal: Bring pt home PT Goal Formulation: With family Time For Goal Achievement: 07/04/17 Potential to Achieve Goals: Fair    Frequency Min 3X/week   Barriers to discharge Decreased caregiver support Pt is requiring increased assistance at this time (+2).     Co-evaluation PT/OT/SLP Co-Evaluation/Treatment: Yes Reason for Co-Treatment: Necessary to address cognition/behavior during functional activity;For patient/therapist safety;To address functional/ADL transfers PT goals addressed during session: Mobility/safety with mobility;Balance         AM-PAC PT "6 Clicks" Daily Activity  Outcome Measure Difficulty turning over in bed (including adjusting bedclothes, sheets and blankets)?: Unable Difficulty moving from lying on back to sitting on the side of the bed? : Unable Difficulty sitting down on and standing up from a chair with arms (e.g., wheelchair, bedside commode, etc,.)?: Unable Help needed moving to and from a bed to chair (including a wheelchair)?: Total Help needed walking in hospital room?: Total Help needed climbing 3-5 steps with a railing? : Total 6 Click Score: 6    End of Session   Activity Tolerance: Patient tolerated treatment well Patient left: in bed;with call bell/phone within reach;with bed alarm set;with nursing/sitter  in room;with family/visitor present Nurse Communication: Mobility status;Need for lift equipment PT Visit Diagnosis: Muscle weakness (generalized) (M62.81);Adult, failure to thrive (R62.7)    Time: 0801-0833 PT Time Calculation (min) (ACUTE ONLY): 32 min   Charges:   PT Evaluation $PT Eval Moderate Complexity: 1 Mod     PT G Codes:        Conni SlipperLaura Shondra Capps, PT, DPT Acute Rehabilitation Services Pager: [phone]   Marylynn PearsonLaura D Sandy Blouch 06/20/2017, 10:58  AM

## 2017-06-20 NOTE — Progress Notes (Signed)
[facility] TEAM 1 - Stepdown/ICU TEAM  Megan [name]  [id number] DOB: 1922/01/26 DOA: 06/18/2017 PCP: [name], John, MD    Brief Narrative:  83 y.o. female with a history of advanced dementia, stroke with right-sided hemiparesis, seizure disorder, chronic hyponatremia, sacral pressure ulcer, and DM2 who presented to the ED w/ confusion and lethargy.  Patient was admitted to the hospital 1 month prior under similar circumstances and had a negative MRI and EEG and her condition was felt to possibly be secondary to hyponatremia (which improved prior to discharge).  She was d/c to a SNF where she remained until 1 week prior to this admission when she returned home.  Since that time, she had been less active and more lethargic than she was before the prior hospitalization.  This worsened to the point the patient was not eating or drinking and not interacting.   In the ED Head CT was negative for acute intracranial abnormality.  Chemistry panel revealed a sodium of 129, BUN of 25, creatinine of 0.98.  CBC features a slight leukocytosis to 10,200 and a stable normocytic anemia with hemoglobin of 10.2.  Urinalysis was notable for glucosuria.   Subjective: Resting comfortably in bed.  Her daughter at bedside reports that she has eaten most of her meals today.  No evidence of resp distress or chest pain.    Assessment & Plan:  Acute encephalopathy on baseline of severe dementia B12 is not low - ammonia is normal - RPR non-reactive - I suspect this is due to signif DH and acute on chronic hyponatremia - appears to have returned to her baseline    Hypotension Resolved w/ volume expansion - stop IVF and follow over night   Decubitus ulcers - heels and B buttocks Care as per WOC RN - stop abx coverage as I am not convinced she has been suffering w/ a signif infection, and wish to lower her risk of diarrhea/C diff  Hyponatremia Na has improved signif w/ simple volume expansion - purportedly has a  chronic hyponatremia, but has normalized w/ IVF - suspect she is simply chronically DH - recheck in AM   Seizure disorder Appears controlled at this time   Chronic grade 2 Diastolic CHF  No evidence of overload at this time - will need to avoid scheduled diuretic as outpt unless intake becomes much more predictable/consistent   History of CVA  DM 2 CBG has been quite elevated - tx has been adjusted - cont to follow trend - no indication for strict control in this setting, but hope to avoid the extremes to avoid acute complications   Chronic anemia Hgb has been stable   DVT prophylaxis: Lovenox Code Status: DNR - NO CODE Family Communication: Spoke with daughter at bedside at length  Disposition Plan: plan for d/c home in AM if remains stable over night   Consultants:  None  Procedures: None  Antimicrobials:  Rocephin 10/27 > 10/29  Objective: Blood pressure (!) 129/43, pulse 93, temperature 98.4 F (36.9 C), temperature source Oral, resp. rate 16, height 5\' 2"  (1.575 m), weight 66.9 kg (147 lb 7.8 oz), SpO2 100 %.  Intake/Output Summary (Last 24 hours) at 06/20/17 1707 Last data filed at 06/20/17 1500  Gross per 24 hour  Intake             1370 ml  Output             1250 ml  Net  120 ml   Filed Weights   06/18/17 0131 06/18/17 0811 06/18/17 0820  Weight: 75.8 kg (167 lb) 66.9 kg (147 lb 7.8 oz) 66.9 kg (147 lb 7.8 oz)    Examination: General: No acute respiratory distress - calm  Lungs: CTA b - no wheezing  Cardiovascular: RRR Abdomen: Nontender, nondistended, soft, bowel sounds positive Extremities: No signif edema bilateral lower extremities   CBC:  Recent Labs Lab 06/18/17 0050 06/18/17 0112 06/18/17 0542 06/19/17 0251 06/20/17 1137  WBC 10.2  --  10.2 10.0 7.4  NEUTROABS 5.4  --  5.2  --   --   HGB 10.2* 10.2* 9.7* 10.3* 10.1*  HCT 31.0* 30.0* 30.2*  30.8* 32.3* 30.6*  MCV 86.8  --  86.0 86.8 87.2  PLT 221  --  222 220 231    Basic Metabolic Panel:  Recent Labs Lab 06/18/17 0050 06/18/17 0112 06/18/17 0542 06/19/17 0251 06/20/17 1137  NA 129* 132* 131* 134* 135  K 4.4 4.3 4.3 4.3 4.4  CL 98* 100* 101 103 104  CO2 22  --  22 22 23   GLUCOSE 227* 229* 231* 161* 241*  BUN 25* 26* 20 21* 15  CREATININE 0.98 1.00 0.76 0.81 0.77  CALCIUM 9.3  --  8.9 9.1 9.1  MG  --   --   --  2.0  --    GFR: Estimated Creatinine Clearance: 39.4 mL/min (by C-G formula based on SCr of 0.77 mg/dL).  Liver Function Tests:  Recent Labs Lab 06/18/17 0050 06/19/17 0251 06/20/17 1137  AST 18 15 16   ALT 9* 9* 9*  ALKPHOS 86 74 81  BILITOT 0.2* 0.5 0.3  PROT 6.2* 6.2* 6.5  ALBUMIN 2.4* 2.2* 2.1*    Recent Labs Lab 06/18/17 0542  AMMONIA 35    HbA1C: Hgb A1c MFr Bld  Date/Time Value Ref Range Status  05/12/2017 07:40 PM 7.1 (H) 4.8 - 5.6 % Final    Comment:    (NOTE) Pre diabetes:          5.7%-6.4% Diabetes:              >6.4% Glycemic control for   <7.0% adults with diabetes   10/23/2014 09:31 AM 8.3 (H) 4.8 - 5.6 % Final    Comment:    (NOTE)         Pre-diabetes: 5.7 - 6.4         Diabetes: >6.4         Glycemic control for adults with diabetes: <7.0     CBG:  Recent Labs Lab 06/19/17 1618 06/19/17 2148 06/20/17 0647 06/20/17 1139 06/20/17 1617  GLUCAP 199* 180* 133* 212* 204*    Scheduled Meds: . clopidogrel  75 mg Oral Q breakfast  . enoxaparin (LOVENOX) injection  40 mg Subcutaneous Q24H  . feeding supplement (ENSURE ENLIVE)  237 mL Oral BID BM  . Gerhardt's butt cream   Topical TID  . insulin aspart  0-15 Units Subcutaneous TID WC  . insulin aspart  0-5 Units Subcutaneous QHS  . insulin glargine  12 Units Subcutaneous QHS  . mouth rinse  15 mL Mouth Rinse BID  . memantine  14 mg Oral Daily  . pantoprazole  40 mg Oral q morning - 10a  . sodium chloride flush  3 mL Intravenous Q12H  . valproic acid  250 mg Oral BID     LOS: 2 days   Lonia Blood, MD [facility] Office  [phone] Pager -  Text Page per Loretha Stapler as per below:  On-Call/Text Page:      [email or web address]      password [email or web address]  If 7PM-7AM, please contact night-coverage [email or web address] Password [email or web address] 06/20/2017, 5:07 PM

## 2017-06-20 NOTE — Evaluation (Signed)
Occupational Therapy Evaluation Patient Details Name: Megan [name] MRN: [id number] DOB: 1922-01-03 Today's Date: 06/20/2017    History of Present Illness 83 y.o.femalewith medical history significant for advanced dementia, history of stroke with right-sided hemiparesis, seizure disorder, chronic hyponatremia, sacral pressure ulcer, and type 2 diabetes mellitus, now presenting to the emergency department for evaluation of confusion and lethargy. CT showing no acute intracarnial pathology. Awating MRI.    Clinical Impression   PTA, pt was living with her daughter who assisted with all ADLs and stand pivot transfer with BSC and w/c. Pt currently requiring Max cues for grooming and feeding and total A for bathing, toileting, and dressing. Pt requiring Max A-total A + 2 for bed mobility. Discussed Hoyer lift for OOB transfers if pt dc home. Recommending dc with HHOT, HHPT, HH aide, and 24 hour assist. However, pt would benefit from further OT at SNF to optimize safety with ADLs and transfers as well as decreased caregiver burden if medicare will cover. Will continue to follow acutely to facilitate safe dc.      Follow Up Recommendations  Home health OT;Supervision/Assistance - 24 hour (HH RN; [facility] Aide)    Equipment Recommendations   Michiel Sites lift)    Recommendations for Other Services PT consult     Precautions / Restrictions Precautions Precautions: Fall Restrictions Weight Bearing Restrictions: No      Mobility Bed Mobility Overal bed mobility: Needs Assistance Bed Mobility: Supine to Sit;Sit to Supine     Supine to sit: Max assist;+2 for physical assistance Sit to supine: Total assist;+2 for physical assistance   General bed mobility comments: Pt initiating forward flexion during supine>sit. Max A to elevate trunk and bring LEs to EOB. Pt requiring total a for sit>supine  Transfers                 General transfer comment: Not attempted    Balance Overall balance  assessment: Needs assistance Sitting-balance support: No upper extremity supported;Feet supported Sitting balance-Leahy Scale: Fair Sitting balance - Comments: Able to maintain sitting balance with slight lateral lean to R. Once pt began to fatigue, she required Min A to correct sitting posture and balance.  (sitting at EOB ~10 min) Postural control: Right lateral lean;Left lateral lean                                 ADL either performed or assessed with clinical judgement   ADL Overall ADL's : Needs assistance/impaired                                       General ADL Comments: Pt requiring Max cues for all ADLs. Pt requiring total A for dressing, bathing, and toileting. Pt performing grooming tasks and self feeding with max cues.      Vision         Perception     Praxis      Pertinent Vitals/Pain Pain Assessment: Faces Faces Pain Scale: No hurt Pain Intervention(s): Monitored during session     Hand Dominance Right   Extremity/Trunk Assessment Upper Extremity Assessment Upper Extremity Assessment: RUE deficits/detail;LUE deficits/detail RUE Deficits / Details: Little AAROM during grasp; prior stroke impacting R side and pt doe not use R hand often LUE Deficits / Details: AROM to grasp with Max visual, verbal, and tactile cues   Lower Extremity Assessment  Lower Extremity Assessment: Defer to PT evaluation       Communication Communication Communication: Expressive difficulties;HOH   Cognition Arousal/Alertness: Awake/alert Behavior During Therapy: Flat affect Overall Cognitive Status: History of cognitive impairments - at baseline                                 General Comments: Diagnosis of dementia   General Comments  Daughter present throughout session    Exercises     Shoulder Instructions      Home Living Family/patient expects to be discharged to:: Private residence Living Arrangements:  Children Available Help at Discharge: Family;Available 24 hours/day Type of Home: House Home Access: Level entry     Home Layout: One level     Bathroom Shower/Tub: Chief Strategy OfficerTub/shower unit   Bathroom Toilet: Standard     Home Equipment: Bedside commode;Wheelchair - [employer];Walker - 2 wheels;Cane - single point Arts administrator(Lift chair)          Prior Functioning/Environment Level of Independence: Needs assistance  Gait / Transfers Assistance Needed: D[facility] ADL's / Homemaking Assistance Needed: Daughter performing ADLs and encourages pt to particiapte in simple tasks such as self feedign and grooming            OT Problem List: Decreased strength;Decreased range of motion;Decreased activity tolerance;Impaired balance (sitting and/or standing);Decreased cognition;Decreased safety awareness;Decreased knowledge of precautions;Decreased knowledge of use of DME or AE      OT Treatment/Interventions: Self-care/ADL training;Therapeutic exercise;Energy conservation;DME and/or AE instruction;Therapeutic activities;Patient/family education    OT Goals(Current goals can be found in the care plan section) Acute Rehab OT Goals Patient Stated Goal: Bring pt home OT Goal Formulation: With family Time For Goal Achievement: 07/04/17 Potential to Achieve Goals: Good ADL Goals Pt Will Perform Eating: with set-up;with supervision;sitting Pt Will Perform Grooming: with set-up;with supervision;sitting (Mod VCs) Pt Will Perform Upper Body Bathing: with mod assist;sitting Additional ADL Goal #1: Pt will perform bed mobility with Mod A +2 in preparation for ADLs in sitting  OT Frequency: Min 2X/week   Barriers to D/C:            Co-evaluation              AM-PAC PT "6 Clicks" Daily Activity     Outcome Measure Help from another person eating meals?: A Lot Help from another person taking care of personal grooming?: A Lot Help from another person toileting, which  includes using toliet, bedpan, or urinal?: Total Help from another person bathing (including washing, rinsing, drying)?: Total Help from another person to put on and taking off regular upper body clothing?: Total Help from another person to put on and taking off regular lower body clothing?: Total 6 Click Score: 8   End of Session Nurse Communication: Mobility status;Need for lift equipment  Activity Tolerance: Patient tolerated treatment well;Patient limited by fatigue Patient left: in bed;with call bell/phone within reach;with bed alarm set;with family/visitor present  OT Visit Diagnosis: Unsteadiness on feet (R26.81);Other abnormalities of gait and mobility (R26.89);Muscle weakness (generalized) (M62.81);Other symptoms and signs involving cognitive function                Time: 1191-47820801-0833 OT Time Calculation (min): 32 min Charges:  OT General Charges $OT Visit: 1 Visit OT Evaluation $OT Eval Moderate Complexity: 1 Mod G-Codes:     Aubriella Perezgarcia MSOT, OTR/L Acute Rehab Pager: [phone] Office: [phone]   Theodoro Gristharis M Katlyn Muldrew  06/20/2017, 9:39 AM

## 2017-06-20 NOTE — Care Management Note (Signed)
Case Management Note  Patient Details  Name: Omar PersonRuby Eichinger MRN: [id number] Date of Birth: 12/11/1921  Subjective/Objective:                 Pt admitted from home following TIA.  Pt at home with daughter who provides care 24/7.  Pt had HH during last week after d/c from SNF.  Pt's Medicare benefit for SNF is depleted.  Pt's daughter has a sister and a friend who helps 2-3 days/week and asks for more affordable help.  Pt's daughter had arranged with [facility] to deliver hospital bed as soon as today.  Pt has 3in1 that needs to be replaced and has new recommendation for [employer] Lift.  Pt had HH services through [facility] including PT, OT, ST, RN, and Aide.   Action/Plan: Hoyer Lift and 3in1 [facility]  Will be delivered with hospital bed to patient's home.  [facility], RN, Nurse's Aide.  Pt's daughter given information about [facility] program that offers PCA services but has a 1-year waiting list.  Expected Discharge Date:                  Expected Discharge Plan:  Home w Home Health Services  In-House Referral:  NA  Discharge planning Services  CM Consult  Post Acute Care Choice:  Durable Medical Equipment Choice offered to:  Adult Children  DME Arranged:  3-N-1 Secondary school teacher(Hoyer Lift) DME Agency:  [facility]  HH Arranged:  RN, PT, OT, Nurse's Aide, Speech Therapy HH Agency:  [facility]  Status of Service:  In process, will continue to follow  If discussed at Long Length of Stay Meetings, dates discussed:    Additional Comments:  Verdene LennertGoldean, Elim Economou K, RN 06/20/2017, 11:23 AM

## 2017-06-21 LAB — BASIC METABOLIC PANEL
Anion gap: 5 (ref 5–15)
BUN: 14 mg/dL (ref 6–20)
CALCIUM: 9.3 mg/dL (ref 8.9–10.3)
CHLORIDE: 106 mmol/L (ref 101–111)
CO2: 25 mmol/L (ref 22–32)
CREATININE: 0.79 mg/dL (ref 0.44–1.00)
GFR calc Af Amer: >60 mL/min (ref >60)
GFR calc non Af Amer: >60 mL/min (ref >60)
GLUCOSE: 175 mg/dL — AB (ref 65–99)
Potassium: 4.7 mmol/L (ref 3.5–5.1)
Sodium: 136 mmol/L (ref 135–145)

## 2017-06-21 LAB — GLUCOSE, CAPILLARY
Glucose-Capillary: 142 mg/dL — ABNORMAL HIGH (ref 65–99)
Glucose-Capillary: 243 mg/dL — ABNORMAL HIGH (ref 65–99)

## 2017-06-21 MED ORDER — TOUJEO SOLOSTAR 300 UNIT/ML ~~LOC~~ SOPN: 14.0000 [IU] | PEN_INJECTOR | Freq: Every evening | SUBCUTANEOUS | 0 refills | Status: DC

## 2017-06-21 NOTE — Discharge Summary (Addendum)
DISCHARGE SUMMARY  Megan [name]  MR#: [id number]  DOB:1922-03-27  Date of Admission: 06/18/2017 Date of Discharge: 06/21/2017  Attending Physician:Jeorgia Helming T  Patient's WUJ:[id number], Megan Ruiz, MD  Consults:  none  Disposition: D/C home   Follow-up Appts: Follow-up Information    [name], MD Follow up in 1 week(s).   Specialty:  Internal Medicine Contact information: [address] [place] [address] [phone]           Tests Needing Follow-up: - assess Na and volume status  - assess CBG control  - assess intake / nutritional status   Wound Care: - Wound care to bilateral buttock pressure injuries L>R (Stage 3):  Cleanse with NS, pat gently dry. Apply Desitin or A&D ointment, massaging in lightly until disappears. Turn from side to side and minimize time spent in the supine position. - Wound care to bilateral heel Pressure injuries L>R (L = Unstageable, R = Stage 2):  Cleanse with NS, pat gently dry. Paint injuries with betadine swabstick and allow to air-dry.  Cover with dry gauze dressing and secure with a few turns of Kerlix roll gauze/paper tape. Place feet into [employer].  Discharge Diagnoses: Acute encephalopathy due to dehydration and hyponatremia (metabolic) on baseline of severe dementia Hypotension Decubitus ulcers - heels and B buttocks Hyponatremia Seizure disorder  Chronic grade 2 Diastolic CHF  History of CVA DM 2 Chronic anemia  Initial presentation: 83 y.o.femalewith a history of advanced dementia, stroke with right-sided hemiparesis, seizure disorder, chronic hyponatremia, sacral pressure ulcer, and DM2 who presented to the ED w/ confusion and lethargy. Patient was admitted to the hospital 1 month prior under similar circumstances and had a negative MRI and EEG and her condition was felt to possibly be secondary to hyponatremia (which improved prior to discharge). She was d/c to a SNF where she remained until 1  week prior to this admission when she returned home. Since that time, she had been less active and more lethargic than she was before the prior hospitalization, w/ the family reporting that she actually got very little PT/OT at the facility.  This worsened to the point the patient was not eating or drinking and not interacting.   In the ED Head CT was negative for acute intracranial abnormality. Chemistry panel revealed a sodium of 129, BUN of 25, creatinine of 0.98. CBC features a slight leukocytosis to 10,200 and a stable normocytic anemia with hemoglobin of 10.2. Urinalysis was notable for glucosuria.   Hospital Course:  Acute encephalopathy on baseline of severe dementia B12 is not low - ammonia is normal - RPR non-reactive - I suspect this is due to signif DH and acute on chronic hyponatremia - appears to have returned to her baseline quite rapidly w/ little more than IVF resuscitation and CBG control - no evidence of active infection - suspect her dementia may be progressing to the point that her appetite is suffering, and consistent intake is becoming quite limited (despite attentive care from a very caring family) - hyperglycemia also likely contributing by leading to osmotic diuresis   Hypotension Resolved w/ volume expansion   Decubitus ulcers - heels and B buttocks Care as per WOC RN - stopped abx coverage as I am not convinced she has been suffering w/ a signif infection, and wish to lower her risk of diarrhea/C diff - cont wound care as detailed above   Hyponatremia Na has normalized w/ simple volume expansion - purportedly has a chronic hyponatremia, but has normalized  w/ IVF - suspect she is simply chronically DH, w/ acute worsening in setting of hyperglycemia (some pseudohyponatremia, as well as true osmotic diuresis) - encourage fluids as able at home - control CBG   Seizure disorder  Appears controlled at this time   Chronic grade 2 Diastolic CHF  No evidence of  overload at this time - will need to avoid scheduled diuretic as outpt unless intake becomes much more predictable/consistent - suspect a large part of her recurring LE edema is related to low albumin +/- venous insuff   History of CVA  DM 2 CBG has been quite elevated - tx has been adjusted - no indication for strict control in this setting, but hope to avoid the extremes to avoid acute complications   Chronic anemia Hgb has been stable   Allergies as of 06/21/2017      Reactions   Lipitor [atorvastatin] Other (See Comments)   Myalgias       Medication List    STOP taking these medications   furosemide 20 MG tablet Commonly known as:  LASIX   sodium chloride 1 g tablet     TAKE these medications   clopidogrel 75 MG tablet Commonly known as:  PLAVIX Take 1 tablet (75 mg total) by mouth daily with breakfast.   divalproex 500 MG 24 hr tablet Commonly known as:  DEPAKOTE ER Take 1 tablet (500 mg total) by mouth at bedtime.   guaiFENesin 600 MG 12 hr tablet Commonly known as:  MUCINEX Take 600 mg by mouth 2 (two) times daily as needed for to loosen phlegm.   Melatonin 5 MG Tabs Take 5 mg by mouth at bedtime as needed (sleep).   NAMENDA XR 14 MG Cp24 24 hr capsule Generic drug:  memantine Take 14 mg by mouth daily.   nystatin powder Generic drug:  nystatin Apply 1 application topically 2 (two) times daily as needed for rash.   pantoprazole 40 MG tablet Commonly known as:  PROTONIX Take 40 mg by mouth every morning.   protein supplement shake Liqd Commonly known as:  PREMIER PROTEIN Take 2 oz by mouth 4 (four) times daily as needed (meal replacement).   rosuvastatin 5 MG tablet Commonly known as:  CRESTOR Take 5 mg by mouth every Monday, Wednesday, and Friday.   TOUJEO SOLOSTAR 300 UNIT/ML Sopn Generic drug:  Insulin Glargine Inject 14 Units as directed every evening. What changed:  how much to take   zinc oxide 11.3 % Crea cream Commonly known as:   BALMEX Apply 1 application topically daily.            Durable Medical Equipment        Start     Ordered   06/20/17 1118  For home use only DME Other see comment  Once    Comments:  Michiel [employer] Lift   06/20/17 1117   06/20/17 1117  For home use only DME 3 n 1  Once     06/20/17 1117      Day of Discharge BP (!) 133/53 (BP Location: Right Arm)   Pulse 93   Temp 97.8 F (36.6 C) (Oral)   Resp 16   Ht 5\' 2"  (1.575 m)   Wt 66.9 kg (147 lb 7.8 oz)   SpO2 96%   BMI 26.98 kg/m   Physical Exam: General: No acute respiratory distress Lungs: Clear to auscultation bilaterally without wheezes or crackles Cardiovascular: Regular rate and rhythm without murmur gallop or rub normal S1 and  S2 Abdomen: Nontender, nondistended, soft, bowel sounds positive, no rebound, no ascites, no appreciable mass Extremities: No significant cyanosis, clubbing, or edema bilateral lower extremities  Basic Metabolic Panel:  Recent Labs Lab 06/18/17 0050 06/18/17 0112 06/18/17 0542 06/19/17 0251 06/20/17 1137 06/21/17 0408  NA 129* 132* 131* 134* 135 136  K 4.4 4.3 4.3 4.3 4.4 4.7  CL 98* 100* 101 103 104 106  CO2 22  --  22 22 23 25   GLUCOSE 227* 229* 231* 161* 241* 175*  BUN 25* 26* 20 21* 15 14  CREATININE 0.98 1.00 0.76 0.81 0.77 0.79  CALCIUM 9.3  --  8.9 9.1 9.1 9.3  MG  --   --   --  2.0  --   --     Liver Function Tests:  Recent Labs Lab 06/18/17 0050 06/19/17 0251 06/20/17 1137  AST 18 15 16   ALT 9* 9* 9*  ALKPHOS 86 74 81  BILITOT 0.2* 0.5 0.3  PROT 6.2* 6.2* 6.5  ALBUMIN 2.4* 2.2* 2.1*    Recent Labs Lab 06/18/17 0542  AMMONIA 35   CBC:  Recent Labs Lab 06/18/17 0050 06/18/17 0112 06/18/17 0542 06/19/17 0251 06/20/17 1137  WBC 10.2  --  10.2 10.0 7.4  NEUTROABS 5.4  --  5.2  --   --   HGB 10.2* 10.2* 9.7* 10.3* 10.1*  HCT 31.0* 30.0* 30.2*  30.8* 32.3* 30.6*  MCV 86.8  --  86.0 86.8 87.2  PLT 221  --  222 220 231    CBG:  Recent Labs Lab  06/20/17 0647 06/20/17 1139 06/20/17 1617 06/20/17 2059 06/21/17 0623  GLUCAP 133* 212* 204* 176* 142*    Recent Results (from the past 240 hour(s))  Blood culture (routine x 2)     Status: None (Preliminary result)   Collection Time: 06/18/17 12:50 AM  Result Value Ref Range Status   Specimen Description BLOOD RIGHT ANTECUBITAL  Final   Special Requests   Final    BOTTLES DRAWN AEROBIC AND ANAEROBIC Blood Culture adequate volume   Culture NO GROWTH 2 DAYS  Final   Report Status PENDING  Incomplete  Blood culture (routine x 2)     Status: None (Preliminary result)   Collection Time: 06/18/17  4:25 AM  Result Value Ref Range Status   Specimen Description BLOOD LEFT ARM  Final   Special Requests   Final    BOTTLES DRAWN AEROBIC AND ANAEROBIC Blood Culture adequate volume   Culture NO GROWTH 2 DAYS  Final   Report Status PENDING  Incomplete  MRSA PCR Screening     Status: None   Collection Time: 06/18/17  9:44 AM  Result Value Ref Range Status   MRSA by PCR NEGATIVE NEGATIVE Final    Comment:        The GeneXpert MRSA Assay (FDA approved for NASAL specimens only), is one component of a comprehensive MRSA colonization surveillance program. It is not intended to diagnose MRSA infection nor to guide or monitor treatment for MRSA infections.      Time spent in discharge (includes decision making & examination of pt): 35 minutes  06/21/2017, 9:47 AM   Lonia BloodJeffrey T. Anothy Bufano, MD [facility] Office  [phone] Pager [phone]  On-Call/Text Page:      [email or web address]      password [email or web address]

## 2017-06-21 NOTE — Care Management Note (Addendum)
Case Management Note  Patient Details  Name: Megan [name] MRN: [id number] Date of Birth: [birth year]  Subjective/Objective:                    Action/Plan: Pt discharging home today with resumption of HH services through [facility]. Sarah with [facility] notified of resumption orders and that pt would be retuning home today.  CM called and verified equipment ordered yesterday would be delivered to the patients home with the bed the daughter has [facility]. The daughter is going to arrange for delivery of the DME for Wed the 31st. She states the patient is able to stay in her regular bed tonight.  Daughter asked that patient be transported home via [facility]. CM called and arranged for pick up at 3 pm per daughter request. Transport form on the front of the chart.  Bedside RN updated.   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  HH Arranged:  RN, PT, OT, Nurse's Aide, Speech Therapy HH Agency:  [facility], [facility]  Status of Service:  Completed, signed off  If discussed at Long Length of Stay Meetings, dates discussed:    Additional Comments:  Kermit BaloKelli F Sabryna Lahm, RN 06/21/2017, 12:36 PM

## 2017-06-21 NOTE — Progress Notes (Signed)
Patient left unit via [facility].  Family left with belongings.

## 2017-06-21 NOTE — Progress Notes (Signed)
Patient family given discharge instructions.  All questions and concerns addressed.

## 2017-06-23 LAB — CULTURE, BLOOD (ROUTINE X 2)
CULTURE: NO GROWTH
Culture: NO GROWTH
SPECIAL REQUESTS: ADEQUATE
Special Requests: ADEQUATE

## 2017-10-21 DIAGNOSIS — J189 Pneumonia, unspecified organism: Secondary | ICD-10-CM

## 2017-10-21 DIAGNOSIS — N390 Urinary tract infection, site not specified: Secondary | ICD-10-CM

## 2017-10-21 HISTORY — DX: Pneumonia, unspecified organism: J18.9

## 2017-10-21 HISTORY — DX: Urinary tract infection, site not specified: N39.0

## 2017-10-26 ENCOUNTER — Emergency Department (HOSPITAL_COMMUNITY): Payer: Medicare Other

## 2017-10-26 ENCOUNTER — Inpatient Hospital Stay (HOSPITAL_COMMUNITY)
Admission: EM | Admit: 2017-10-26 | Discharge: 2017-10-29 | DRG: 871 | Disposition: A | Discharge status: Home Health | Payer: Medicare Other | Attending: Family Medicine | Admitting: Family Medicine

## 2017-10-26 ENCOUNTER — Encounter (HOSPITAL_COMMUNITY): Payer: Self-pay | Admitting: Emergency Medicine

## 2017-10-26 DIAGNOSIS — K5900 Constipation, unspecified: Secondary | ICD-10-CM | POA: Diagnosis not present

## 2017-10-26 DIAGNOSIS — J181 Lobar pneumonia, unspecified organism: Secondary | ICD-10-CM | POA: Diagnosis present

## 2017-10-26 DIAGNOSIS — A419 Sepsis, unspecified organism: Secondary | ICD-10-CM | POA: Diagnosis not present

## 2017-10-26 DIAGNOSIS — G3183 Dementia with Lewy bodies: Secondary | ICD-10-CM | POA: Diagnosis not present

## 2017-10-26 DIAGNOSIS — E871 Hypo-osmolality and hyponatremia: Secondary | ICD-10-CM

## 2017-10-26 DIAGNOSIS — Z8249 Family history of ischemic heart disease and other diseases of the circulatory system: Secondary | ICD-10-CM | POA: Diagnosis not present

## 2017-10-26 DIAGNOSIS — N390 Urinary tract infection, site not specified: Secondary | ICD-10-CM | POA: Diagnosis present

## 2017-10-26 DIAGNOSIS — G40909 Epilepsy, unspecified, not intractable, without status epilepticus: Secondary | ICD-10-CM | POA: Diagnosis present

## 2017-10-26 DIAGNOSIS — L89313 Pressure ulcer of right buttock, stage 3: Secondary | ICD-10-CM | POA: Diagnosis present

## 2017-10-26 DIAGNOSIS — J189 Pneumonia, unspecified organism: Secondary | ICD-10-CM | POA: Diagnosis present

## 2017-10-26 DIAGNOSIS — L89620 Pressure ulcer of left heel, unstageable: Secondary | ICD-10-CM | POA: Diagnosis present

## 2017-10-26 DIAGNOSIS — E785 Hyperlipidemia, unspecified: Secondary | ICD-10-CM | POA: Diagnosis present

## 2017-10-26 DIAGNOSIS — Z833 Family history of diabetes mellitus: Secondary | ICD-10-CM | POA: Diagnosis not present

## 2017-10-26 DIAGNOSIS — R4182 Altered mental status, unspecified: Secondary | ICD-10-CM | POA: Diagnosis present

## 2017-10-26 DIAGNOSIS — F0280 Dementia in other diseases classified elsewhere without behavioral disturbance: Secondary | ICD-10-CM | POA: Diagnosis present

## 2017-10-26 DIAGNOSIS — Z8673 Personal history of transient ischemic attack (TIA), and cerebral infarction without residual deficits: Secondary | ICD-10-CM

## 2017-10-26 DIAGNOSIS — Z823 Family history of stroke: Secondary | ICD-10-CM | POA: Diagnosis not present

## 2017-10-26 DIAGNOSIS — I69351 Hemiplegia and hemiparesis following cerebral infarction affecting right dominant side: Secondary | ICD-10-CM | POA: Diagnosis not present

## 2017-10-26 DIAGNOSIS — Z66 Do not resuscitate: Secondary | ICD-10-CM | POA: Diagnosis present

## 2017-10-26 DIAGNOSIS — L89322 Pressure ulcer of left buttock, stage 2: Secondary | ICD-10-CM | POA: Diagnosis present

## 2017-10-26 DIAGNOSIS — G9341 Metabolic encephalopathy: Secondary | ICD-10-CM | POA: Diagnosis present

## 2017-10-26 DIAGNOSIS — E119 Type 2 diabetes mellitus without complications: Secondary | ICD-10-CM | POA: Diagnosis present

## 2017-10-26 DIAGNOSIS — K219 Gastro-esophageal reflux disease without esophagitis: Secondary | ICD-10-CM | POA: Diagnosis present

## 2017-10-26 DIAGNOSIS — Z7902 Long term (current) use of antithrombotics/antiplatelets: Secondary | ICD-10-CM

## 2017-10-26 DIAGNOSIS — L8990 Pressure ulcer of unspecified site, unspecified stage: Secondary | ICD-10-CM | POA: Diagnosis present

## 2017-10-26 LAB — VALPROIC ACID LEVEL: Valproic Acid Lvl: 41 ug/mL — ABNORMAL LOW (ref 50.0–100.0)

## 2017-10-26 LAB — CBC
HCT: 35.6 % — ABNORMAL LOW (ref 36.0–46.0)
Hemoglobin: 11.3 g/dL — ABNORMAL LOW (ref 12.0–15.0)
MCH: 28 pg (ref 26.0–34.0)
MCHC: 31.7 g/dL (ref 30.0–36.0)
MCV: 88.1 fL (ref 78.0–100.0)
PLATELETS: 283 10*3/uL (ref 150–400)
RBC: 4.04 MIL/uL (ref 3.87–5.11)
RDW: 17.4 % — ABNORMAL HIGH (ref 11.5–15.5)
WBC: 14.2 10*3/uL — AB (ref 4.0–10.5)

## 2017-10-26 LAB — URINALYSIS, ROUTINE W REFLEX MICROSCOPIC
Bilirubin Urine: NEGATIVE
GLUCOSE, UA: 50 mg/dL — AB
Ketones, ur: 5 mg/dL — AB
NITRITE: NEGATIVE
PH: 7 (ref 5.0–8.0)
Protein, ur: 30 mg/dL — AB
SPECIFIC GRAVITY, URINE: 1.013 (ref 1.005–1.030)

## 2017-10-26 LAB — COMPREHENSIVE METABOLIC PANEL
ALK PHOS: 119 U/L (ref 38–126)
ALT: 14 U/L (ref 14–54)
AST: 23 U/L (ref 15–41)
Albumin: 2.5 g/dL — ABNORMAL LOW (ref 3.5–5.0)
Anion gap: 10 (ref 5–15)
BUN: 21 mg/dL — ABNORMAL HIGH (ref 6–20)
CALCIUM: 9.9 mg/dL (ref 8.9–10.3)
CHLORIDE: 99 mmol/L — AB (ref 101–111)
CO2: 24 mmol/L (ref 22–32)
CREATININE: 1.04 mg/dL — AB (ref 0.44–1.00)
GFR, EST AFRICAN AMERICAN: 52 mL/min — AB (ref >60)
GFR, EST NON AFRICAN AMERICAN: 45 mL/min — AB (ref >60)
Glucose, Bld: 165 mg/dL — ABNORMAL HIGH (ref 65–99)
Potassium: 4.7 mmol/L (ref 3.5–5.1)
Sodium: 133 mmol/L — ABNORMAL LOW (ref 135–145)
Total Bilirubin: 0.4 mg/dL (ref 0.3–1.2)
Total Protein: 7.6 g/dL (ref 6.5–8.1)

## 2017-10-26 LAB — I-STAT CG4 LACTIC ACID, ED
Lactic Acid, Venous: 2.13 mmol/L (ref 0.5–1.9)
Lactic Acid, Venous: 1.35 mmol/L (ref 0.5–1.9)

## 2017-10-26 LAB — GLUCOSE, CAPILLARY: Glucose-Capillary: 164 mg/dL — ABNORMAL HIGH (ref 65–99)

## 2017-10-26 LAB — CBG MONITORING, ED: Glucose-Capillary: 188 mg/dL — ABNORMAL HIGH (ref 65–99)

## 2017-10-26 MED ORDER — SODIUM CHLORIDE 0.9 % IV SOLN
1000.0000 mL | INTRAVENOUS | Status: DC
  Administered 2017-10-26: 1000 mL via INTRAVENOUS

## 2017-10-26 MED ORDER — SODIUM CHLORIDE 1 G PO TABS
1.0000 g | ORAL_TABLET | Freq: Every day | ORAL | Status: DC
  Administered 2017-10-26 – 2017-10-29 (×4): 1 g via ORAL
  Filled 2017-10-26 (×4): qty 1

## 2017-10-26 MED ORDER — MEMANTINE HCL ER 14 MG PO CP24
14.0000 mg | ORAL_CAPSULE | Freq: Every day | ORAL | Status: DC
  Administered 2017-10-26 – 2017-10-28 (×3): 14 mg via ORAL
  Filled 2017-10-26 (×5): qty 1

## 2017-10-26 MED ORDER — PREMIER PROTEIN SHAKE
2.0000 [oz_av] | Freq: Four times a day (QID) | ORAL | Status: DC | PRN
  Filled 2017-10-26: qty 325.31

## 2017-10-26 MED ORDER — SODIUM CHLORIDE 0.9 % IV SOLN
1.0000 g | INTRAVENOUS | Status: DC
  Administered 2017-10-27: 1 g via INTRAVENOUS
  Filled 2017-10-26 (×3): qty 10

## 2017-10-26 MED ORDER — FERROUS SULFATE 325 (65 FE) MG PO TABS
325.0000 mg | ORAL_TABLET | Freq: Every day | ORAL | Status: DC
  Administered 2017-10-27 – 2017-10-29 (×3): 325 mg via ORAL
  Filled 2017-10-26 (×3): qty 1

## 2017-10-26 MED ORDER — MELATONIN 3 MG PO TABS
6.0000 mg | ORAL_TABLET | Freq: Every evening | ORAL | Status: DC | PRN
  Filled 2017-10-26: qty 2

## 2017-10-26 MED ORDER — ENOXAPARIN SODIUM 30 MG/0.3ML ~~LOC~~ SOLN
30.0000 mg | SUBCUTANEOUS | Status: DC
  Administered 2017-10-26 – 2017-10-28 (×3): 30 mg via SUBCUTANEOUS
  Filled 2017-10-26 (×3): qty 0.3

## 2017-10-26 MED ORDER — GUAIFENESIN ER 600 MG PO TB12: 600.0000 mg | ORAL_TABLET | Freq: Two times a day (BID) | ORAL | Status: DC | PRN

## 2017-10-26 MED ORDER — INSULIN ASPART 100 UNIT/ML ~~LOC~~ SOLN
0.0000 [IU] | SUBCUTANEOUS | Status: DC
  Administered 2017-10-26 – 2017-10-27 (×5): 2 [IU] via SUBCUTANEOUS
  Administered 2017-10-28: 1 [IU] via SUBCUTANEOUS
  Administered 2017-10-28: 3 [IU] via SUBCUTANEOUS
  Administered 2017-10-28: 2 [IU] via SUBCUTANEOUS
  Administered 2017-10-28: 3 [IU] via SUBCUTANEOUS
  Administered 2017-10-28: 1 [IU] via SUBCUTANEOUS
  Administered 2017-10-29: 5 [IU] via SUBCUTANEOUS
  Administered 2017-10-29: 2 [IU] via SUBCUTANEOUS

## 2017-10-26 MED ORDER — CLOPIDOGREL BISULFATE 75 MG PO TABS
75.0000 mg | ORAL_TABLET | Freq: Every day | ORAL | Status: DC
  Administered 2017-10-27 – 2017-10-29 (×3): 75 mg via ORAL
  Filled 2017-10-26 (×3): qty 1

## 2017-10-26 MED ORDER — SODIUM CHLORIDE 0.9 % IV SOLN
500.0000 mg | Freq: Once | INTRAVENOUS | Status: AC
  Administered 2017-10-26: 500 mg via INTRAVENOUS
  Filled 2017-10-26: qty 500

## 2017-10-26 MED ORDER — SODIUM CHLORIDE 0.9 % IV SOLN
1.0000 g | Freq: Once | INTRAVENOUS | Status: AC
  Administered 2017-10-26: 1 g via INTRAVENOUS
  Filled 2017-10-26: qty 10

## 2017-10-26 MED ORDER — DIVALPROEX SODIUM ER 500 MG PO TB24
500.0000 mg | ORAL_TABLET | Freq: Every day | ORAL | Status: DC
  Administered 2017-10-26 – 2017-10-28 (×3): 500 mg via ORAL
  Filled 2017-10-26 (×3): qty 1

## 2017-10-26 MED ORDER — VITAMIN C 500 MG PO TABS
500.0000 mg | ORAL_TABLET | Freq: Every day | ORAL | Status: DC
  Administered 2017-10-26 – 2017-10-29 (×4): 500 mg via ORAL
  Filled 2017-10-26 (×3): qty 1

## 2017-10-26 MED ORDER — ROSUVASTATIN CALCIUM 5 MG PO TABS
5.0000 mg | ORAL_TABLET | ORAL | Status: DC
  Administered 2017-10-26 – 2017-10-28 (×2): 5 mg via ORAL
  Filled 2017-10-26 (×4): qty 1

## 2017-10-26 MED ORDER — PANTOPRAZOLE SODIUM 40 MG PO TBEC
40.0000 mg | DELAYED_RELEASE_TABLET | Freq: Every morning | ORAL | Status: DC
  Administered 2017-10-27: 40 mg via ORAL
  Filled 2017-10-26: qty 1

## 2017-10-26 MED ORDER — SODIUM CHLORIDE 0.9 % IV SOLN
500.0000 mg | INTRAVENOUS | Status: DC
  Administered 2017-10-27: 500 mg via INTRAVENOUS
  Filled 2017-10-26 (×3): qty 500

## 2017-10-26 MED ORDER — SODIUM CHLORIDE 0.9 % IV BOLUS (SEPSIS)
500.0000 mL | Freq: Once | INTRAVENOUS | Status: AC
  Administered 2017-10-26: 500 mL via INTRAVENOUS

## 2017-10-26 MED ORDER — SODIUM CHLORIDE 0.9 % IV SOLN
INTRAVENOUS | Status: AC
  Administered 2017-10-26 – 2017-10-27 (×2): via INTRAVENOUS

## 2017-10-26 MED ORDER — ZINC OXIDE 11.3 % EX CREA
1.0000 "application " | TOPICAL_CREAM | Freq: Every day | CUTANEOUS | Status: DC
  Administered 2017-10-27 – 2017-10-29 (×3): 1 via TOPICAL
  Filled 2017-10-26: qty 56

## 2017-10-26 NOTE — H&P (Addendum)
TRH H&P   Patient Demographics:    Megan [name], is a 84 y.o. female  MRN: [id number]   DOB - [birth year]  Admit Date - 10/26/2017  Outpatient Primary MD for the patient is [name], MD  Referring MD/NP/PA:  Drema Pry  Outpatient Specialists:    Patient coming from:  home  Chief Complaint  Patient presents with  . Altered Mental Status      HPI:    Megan [name]  is a 84 y.o. female, w Hyperlipidemia, Dm2, CVA, seizure do, hyponatremia, apparently presents with 3 days of generalized weakness and poor po intake and slight decrease in responsiveness. Pt has memory issues and is unable to provide history.  Slight dry cough x3 days, no dysuria noted but decreased in urine output and ? Slight hypothermia. Her daughter noted 1 episode of n/v (no blood) last nite.   Her daughter is present to provide above history and confirms DNR.    In ED, pt was initially hypotensive.   CT brain IMPRESSION: 1. No acute intracranial abnormality. 2. Atrophy and chronic microvascular white matter ischemic changes. 3. Remote basal ganglia lacunar infarcts. 4. Right mastoid effusion.  CXR  IMPRESSION: RIGHT upper lobe pneumonia.  Chronic bibasilar opacities favoring fibrosis and atelectasis.  Na 133, K 4.7, Bun 21, Creatinine 1.04 Ast 23, Alt 14 Alb 2.5  Wbc 14.2, Hgb 11.3 Plt 283 Depakote 41 Lactic acid 2.13  Urinalysis Wbc tntc, rbc 6-30  Pt will be admitted for sepsis, secondary to UTI, RUL pneumonia.      Review of systems:    In addition to the HPI above,  No Fever-chills, No Headache, No changes with Vision or hearing, No problems swallowing food or Liquids, No Chest pain,  No Sob No Abdominal pain, No Nausea or Vommitting, Bowel movements are regular, No Blood in stool or Urine, No dysuria, No new skin rashes or bruises, No new joints pains-aches,  No new  weakness, tingling, numbness in any extremity, No recent weight gain or loss, No polyuria, polydypsia or polyphagia, No significant Mental Stressors.  A full 10 point Review of Systems was done, except as stated above, all other Review of Systems were negative.   With Past History of the following :    Past Medical History:  Diagnosis Date  . Diabetes mellitus   . Hyperlipidemia   . Seizure (HCC)   . Stroke [facility]) 2012   possible stroke Jan 2016      Past Surgical History:  Procedure Laterality Date  . CATARACT EXTRACTION        Social History:     Social History   Tobacco Use  . Smoking status: Never Smoker  . Smokeless tobacco: Former Neurosurgeon    Types: Snuff  . Tobacco comment: quit 50-60 years ago  Substance Use Topics  . Alcohol use: No    Alcohol/week: 0.0 oz  Lives - at home  Mobility - walks by self  Family History :     Family History  Problem Relation Age of Onset  . CAD Mother   . Hypertension Mother   . Diabetes Mother   . Stroke Mother       Home Medications:   Prior to Admission medications   Medication Sig Start Date End Date Taking? Authorizing Provider  clopidogrel (PLAVIX) 75 MG tablet Take 1 tablet (75 mg total) by mouth daily with breakfast. 08/15/13  Yes [name], John, MD  divalproex (DEPAKOTE ER) 500 MG 24 hr tablet Take 1 tablet (500 mg total) by mouth at bedtime. 07/14/16  Yes Micki RileySethi, Pramod S, MD  ferrous sulfate 325 (65 FE) MG tablet Take 325 mg by mouth daily with breakfast.   Yes [provider]  guaiFENesin (MUCINEX) 600 MG 12 hr tablet Take 600 mg by mouth 2 (two) times daily as needed for to loosen phlegm.   Yes [provider]  Melatonin 5 MG TABS Take 5 mg by mouth at bedtime as needed (sleep).   Yes [provider]  memantine (NAMENDA XR) 14 MG CP24 24 hr capsule Take 14 mg by mouth daily.   Yes [provider]  NYSTATIN powder Apply 1 application topically 2 (two) times daily as  needed for rash. 05/04/17  Yes [provider]  pantoprazole (PROTONIX) 40 MG tablet Take 40 mg by mouth every morning.    Yes [provider]  protein supplement shake (PREMIER PROTEIN) LIQD Take 2 oz by mouth 4 (four) times daily as needed (meal replacement).   Yes [provider]  rosuvastatin (CRESTOR) 5 MG tablet Take 5 mg by mouth every Monday, Wednesday, and Friday.   Yes [provider]  sodium chloride 1 g tablet Take 1 g by mouth daily.   Yes [provider]  TOUJEO SOLOSTAR 300 UNIT/ML SOPN Inject 14 Units as directed every evening. Patient taking differently: Inject 18 Units as directed every evening.  06/21/17  Yes Lonia BloodMcClung, Jeffrey T, MD  vitamin C (ASCORBIC ACID) 500 MG tablet Take 500 mg by mouth daily.   Yes [provider]  zinc oxide (BALMEX) 11.3 % CREA cream Apply 1 application topically daily.   Yes [provider]     Allergies:     Allergies  Allergen Reactions  . Lipitor [Atorvastatin] Other (See Comments)    Myalgias      Physical Exam:   Vitals  Blood pressure (!) 157/59, pulse 91, temperature 98.7 F (37.1 C), temperature source Oral, resp. rate 18, height 5\' 2"  (1.575 m), weight 67.3 kg (148 lb 5.9 oz), SpO2 100 %.   1. General  lying in bed in NAD,    2. Normal affect and insight, Not Suicidal or Homicidal, Awake Alert, Oriented X 3.  3. No F.N deficits, ALL C.Nerves Intact, Strength 5/5 all 4 extremities, Sensation intact all 4 extremities, Plantars down going.  4. Ears and Eyes appear Normal, Conjunctivae clear, PERRLA. Dry Oral Mucosa.  5. Supple Neck, No JVD, No cervical lymphadenopathy appriciated, No Carotid Bruits.  6. Symmetrical Chest wall movement, Good air movement bilaterally, CTAB.  7. RRR, s1, s2, 1/6 sem rusb  8. Positive Bowel Sounds, Abdomen Soft, No tenderness, No organomegaly appriciated,No rebound -guarding or rigidity.  9.  No Cyanosis, Normal Skin Turgor,  Skin  ulcer about 2cm sacral   10. Good muscle tone,  joints appear normal , no effusions, Normal ROM.  11. No  Palpable Lymph Nodes in Neck or Axillae  Skin breakdown on left heel.     Data Review:    CBC Recent Labs  Lab 10/26/17 1213  WBC 14.2*  HGB 11.3*  HCT 35.6*  PLT 283  MCV 88.1  MCH 28.0  MCHC 31.7  RDW 17.4*   ------------------------------------------------------------------------------------------------------------------  Chemistries  Recent Labs  Lab 10/26/17 1213  NA 133*  K 4.7  CL 99*  CO2 24  GLUCOSE 165*  BUN 21*  CREATININE 1.04*  CALCIUM 9.9  AST 23  ALT 14  ALKPHOS 119  BILITOT 0.4   ------------------------------------------------------------------------------------------------------------------ estimated creatinine clearance is 30.4 mL/min (A) (by C-G formula based on SCr of 1.04 mg/dL (H)). ------------------------------------------------------------------------------------------------------------------ No results for input(s): TSH, T4TOTAL, T3FREE, THYROIDAB in the last 72 hours.  Invalid input(s): FREET3  Coagulation profile No results for input(s): INR, PROTIME in the last 168 hours. ------------------------------------------------------------------------------------------------------------------- No results for input(s): DDIMER in the last 72 hours. -------------------------------------------------------------------------------------------------------------------  Cardiac Enzymes No results for input(s): CKMB, TROPONINI, MYOGLOBIN in the last 168 hours.  Invalid input(s): CK ------------------------------------------------------------------------------------------------------------------ No results found for: BNP   ---------------------------------------------------------------------------------------------------------------  Urinalysis    Component Value Date/Time   COLORURINE AMBER (A) 10/26/2017 1930   APPEARANCEUR CLOUDY  (A) 10/26/2017 1930   LABSPEC 1.013 10/26/2017 1930   PHURINE 7.0 10/26/2017 1930   GLUCOSEU 50 (A) 10/26/2017 1930   HGBUR MODERATE (A) 10/26/2017 1930   BILIRUBINUR NEGATIVE 10/26/2017 1930   KETONESUR 5 (A) 10/26/2017 1930   PROTEINUR 30 (A) 10/26/2017 1930   UROBILINOGEN 0.2 10/23/2014 0830   NITRITE NEGATIVE 10/26/2017 1930   LEUKOCYTESUR LARGE (A) 10/26/2017 1930    ----------------------------------------------------------------------------------------------------------------   Imaging Results:    Dg Chest 2 View  Result Date: 10/26/2017 CLINICAL DATA:  Altered mental status, hypotension, fatigue, diabetes mellitus, history of stroke and seizures EXAM: CHEST - 2 VIEW COMPARISON:  06/18/2017 FINDINGS: Minimal enlargement of cardiac silhouette. Atherosclerotic calcification aorta. RIGHT upper lobe airspace opacity consistent with pneumonia. BILATERAL lower lobe opacities which may represent atelectasis or known fibrosis though mild superimposed acute infiltrates are not completely excluded. Upper LEFT lung clear. No pleural effusion or pneumothorax. No acute osseous findings. IMPRESSION: RIGHT upper lobe pneumonia. Chronic bibasilar opacities favoring fibrosis and atelectasis. Electronically Signed   By: Ulyses Southward M.D.   On: 10/26/2017 15:54   Ct Head Wo Contrast  Result Date: 10/26/2017 CLINICAL DATA:  Altered mental status, newly nonverbal today. EXAM: CT HEAD WITHOUT CONTRAST TECHNIQUE: Contiguous axial images were obtained from the base of the skull through the vertex without intravenous contrast. COMPARISON:  06/18/2017. FINDINGS: Brain: No definite evidence of an acute infarct, acute hemorrhage, mass lesion, mass effect or hydrocephalus. Atrophy. Periventricular low attenuation. Probable remote infarcts in the basal ganglia bilaterally. Vascular: No hyperdense vessel or unexpected calcification. Skull: Normal. Negative for fracture or focal lesion. Sinuses/Orbits: No acute finding.  Other: Right mastoid effusion. IMPRESSION: 1. No acute intracranial abnormality. 2. Atrophy and chronic microvascular white matter ischemic changes. 3. Remote basal ganglia lacunar infarcts. 4. Right mastoid effusion. Electronically Signed   By: Leanna Battles M.D.   On: 10/26/2017 13:16       Assessment & Plan:    Principal Problem:   CAP (community acquired pneumonia) Active Problems:   History of CVA w/residual right side deficits   Dementia with Lewy bodies   Altered mental state   Hyponatremia   Pressure injury of skin   UTI (urinary tract infection)    Sepsis (hypotension, elevation in  lactic acid, leukocytosis) Secondary to UTI, CAP Blood culture x2 Awaiting urine culture Check urine strep antigen Check urine legionella antigen Start Rocephin 1gm iv qday Start Zithromax 500mg  iv qday  AMS secondary to UTI/ cap tx above infection w abx  Hyponatremia Gentle hydration with ns iv Check cmp in am  CVA/ Dementia Cont Namenda 14mg  po qday Cont Crestor 5mg  po qday (M, W, F) Cont Plavix  Seizure do Cont Depakote  Gerd Cont protonix  Dm2 Cont Toujeo Fsbs ac and qhs, ISS    DVT Prophylaxis    Lovenox - SCDs  AM Labs Ordered, also please review Full Orders  Family Communication: Admission, patients condition and plan of care including tests being ordered have been discussed with the patient  who indicate understanding and agree with the plan and Code Status.  Code Status FULL CODE  Likely DC to  home  Condition GUARDED    Consults called: none  Admission status: inpatient   Time spent in minutes : 45   Pearson Grippe M.D on 10/26/2017 at 11:40 PM  Between 7am to 7pm - Pager - [phone] . After 7pm go to [email or web address] - password [email or web address]  [facility] - Office  [phone]

## 2017-10-26 NOTE — ED Triage Notes (Signed)
Per eMS;  Patient from home with a previous hx of stroke with right sided weakness residual.  Patient normally verbally able to communicate.  Patient became less verbal starting Sunday, with moans and crying out starting Monday night.  Patient not using words at this time, moans frequently.  Patient makes eye contact, but does not follow any commands.  CBG and VSS.  Patient has had low grade fevers recently, as well as decreased urine output.  Hx of similar symptoms with low sodium.

## 2017-10-26 NOTE — Plan of Care (Signed)
Progressing

## 2017-10-26 NOTE — ED Provider Notes (Signed)
Received sign out from [employer], PA-C.  Pt here with AMS.  Initially hypotensive, BP improves with IVF.  Elevated lactic acid of 2.13, WBC 14.2, CXR showing R upper lobe pna.  Head CT scan normal.  UA is pending. Pt received Rocephin/zithromax as treatment for CAP.  Will consult for admission.    6:24 PM Appreciate consultation from [facility] Dr. Selena Batten who agrees to see and admit pt for further care.   BP 121/73   Pulse 92   Temp 98.8 F (37.1 C) (Oral)   Resp 20   SpO2 98%   Results for orders placed or performed during the hospital encounter of 10/26/17  Comprehensive metabolic panel  Result Value Ref Range   Sodium 133 (L) 135 - 145 mmol/L   Potassium 4.7 3.5 - 5.1 mmol/L   Chloride 99 (L) 101 - 111 mmol/L   CO2 24 22 - 32 mmol/L   Glucose, Bld 165 (H) 65 - 99 mg/dL   BUN 21 (H) 6 - 20 mg/dL   Creatinine, Ser 1.61 (H) 0.44 - 1.00 mg/dL   Calcium 9.9 8.9 - 09.6 mg/dL   Total Protein 7.6 6.5 - 8.1 g/dL   Albumin 2.5 (L) 3.5 - 5.0 g/dL   AST 23 15 - 41 U/L   ALT 14 14 - 54 U/L   Alkaline Phosphatase 119 38 - 126 U/L   Total Bilirubin 0.4 0.3 - 1.2 mg/dL   GFR calc non Af Amer 45 (L) >60 mL/min   GFR calc Af Amer 52 (L) >60 mL/min   Anion gap 10 5 - 15  CBC  Result Value Ref Range   WBC 14.2 (H) 4.0 - 10.5 K/uL   RBC 4.04 3.87 - 5.11 MIL/uL   Hemoglobin 11.3 (L) 12.0 - 15.0 g/dL   HCT 04.5 (L) 40.9 - 81.1 %   MCV 88.1 78.0 - 100.0 fL   MCH 28.0 26.0 - 34.0 pg   MCHC 31.7 30.0 - 36.0 g/dL   RDW 91.4 (H) 78.2 - 95.6 %   Platelets 283 150 - 400 K/uL  Valproic acid level  Result Value Ref Range   Valproic Acid Lvl 41 (L) 50.0 - 100.0 ug/mL  I-Stat CG4 Lactic Acid, ED  Result Value Ref Range   Lactic Acid, Venous 2.13 (HH) 0.5 - 1.9 mmol/L   Comment NOTIFIED PHYSICIAN   I-Stat CG4 Lactic Acid, ED  Result Value Ref Range   Lactic Acid, Venous 1.35 0.5 - 1.9 mmol/L   Dg Chest 2 View  Result Date: 10/26/2017 CLINICAL DATA:  Altered mental status,  hypotension, fatigue, diabetes mellitus, history of stroke and seizures EXAM: CHEST - 2 VIEW COMPARISON:  06/18/2017 FINDINGS: Minimal enlargement of cardiac silhouette. Atherosclerotic calcification aorta. RIGHT upper lobe airspace opacity consistent with pneumonia. BILATERAL lower lobe opacities which may represent atelectasis or known fibrosis though mild superimposed acute infiltrates are not completely excluded. Upper LEFT lung clear. No pleural effusion or pneumothorax. No acute osseous findings. IMPRESSION: RIGHT upper lobe pneumonia. Chronic bibasilar opacities favoring fibrosis and atelectasis. Electronically Signed   By: Ulyses Southward M.D.   On: 10/26/2017 15:54   Ct Head Wo Contrast  Result Date: 10/26/2017 CLINICAL DATA:  Altered mental status, newly nonverbal today. EXAM: CT HEAD WITHOUT CONTRAST TECHNIQUE: Contiguous axial images were obtained from the base of the skull through the vertex without intravenous contrast. COMPARISON:  06/18/2017. FINDINGS: Brain: No definite evidence of an acute infarct, acute hemorrhage, mass lesion, mass effect or hydrocephalus. Atrophy.  Periventricular low attenuation. Probable remote infarcts in the basal ganglia bilaterally. Vascular: No hyperdense vessel or unexpected calcification. Skull: Normal. Negative for fracture or focal lesion. Sinuses/Orbits: No acute finding. Other: Right mastoid effusion. IMPRESSION: 1. No acute intracranial abnormality. 2. Atrophy and chronic microvascular white matter ischemic changes. 3. Remote basal ganglia lacunar infarcts. 4. Right mastoid effusion. Electronically Signed   By: Leanna BattlesMelinda  Blietz M.D.   On: 10/26/2017 13:16      Fayrene Helperran, Deryck Hippler, PA-C 10/26/17 1824    Abelino DerrickMackuen, Courteney Lyn, MD 10/27/17 [phone]

## 2017-10-26 NOTE — Progress Notes (Signed)
Spoke with patients nurse, [place]. Notified that this nurse attempted IV start. Unsuccessful. At this time patient does not have other intravenous fluids order. Instructed to notify [facility] if 2nd access is needed. VU. Tomasita MorrowHeather Terica Yogi, RN [facility]

## 2017-10-26 NOTE — ED Notes (Addendum)
The blood has to be redrawn for test of CG4, I didn't have enough blood to run test.

## 2017-10-26 NOTE — ED Notes (Signed)
IV team at bedside. IV team not able to get second IV.

## 2017-10-26 NOTE — ED Provider Notes (Signed)
Medical screening examination/treatment/procedure(s) were conducted as a shared visit with non-physician practitioner(s) and myself.  I personally evaluated the patient during the encounter. Briefly, the patient is a 84 y.o. female with a history of Lewy body/vascular dementia, prior strokes with residual right-sided deficits, seizure disorder on Depakote who presents to the emergency department with altered mental status described as being less verbal than normal.  This began several days ago.  Workup revealed evidence of right upper lobe pneumonia and urinary tract infection.  Patient was treated empirically with IV antibiotics and admitted to the hospital for continued management..    EKG Interpretation None           Daegen Berrocal, Amadeo GarnetPedro Eduardo, MD 10/27/17 1051

## 2017-10-26 NOTE — Progress Notes (Signed)
Pharmacy Antibiotic Note  Megan [name] is a 84 y.o. female admitted on 10/26/2017 with pneumonia.  Pharmacy has been consulted for ceftriaxone and azithromycin dosing. WBC 14.2.   Plan: -Ceftriaxone 1 gm IV Q 24 hours  -Azithromycin 500 mg IV Q 24 hours -Pharmacy to sign off as no further dosage adjustment necessary     Temp (24hrs), Avg:98.8 F (37.1 C), Min:98.8 F (37.1 C), Max:98.8 F (37.1 C)  Recent Labs  Lab 10/26/17 1213 10/26/17 1404 10/26/17 1642  WBC 14.2*  --   --   CREATININE 1.04*  --   --   LATICACIDVEN  --  2.13* 1.35    CrCl cannot be calculated (Unknown ideal weight.).    Allergies  Allergen Reactions  . Lipitor [Atorvastatin] Other (See Comments)    Myalgias      Thank you for allowing pharmacy to be a part of this patient's care.  Vinnie LevelBenjamin Marlena Barbato, PharmD., BCPS Clinical Pharmacist Clinical phone for 10/26/17 until 11pm: [phone] If after 11pm, please call main pharmacy at: [phone]

## 2017-10-26 NOTE — ED Notes (Addendum)
Pur-Wick placed. Instructions given for use. 

## 2017-10-26 NOTE — ED Provider Notes (Addendum)
[facility] EMERGENCY DEPARTMENT Provider Note   CSN: [id number] Arrival date & time: 10/26/17  1154     History   Chief Complaint Chief Complaint  Patient presents with  . Altered Mental Status    HPI Megan [name] is a 84 y.o. female with a pmhx of DM, lewy-body/vascular dementia, stroke with residual ride sided hemiparesis, seizure disorder on Flagyl, chronic hyponatremia on sodium supplements, sacral pressure ulcer, T2DM, who presented to the ED today brought in by her family for altered mental status. Per daughter patient has been less verbally responsive since yesterday, only moaning and groaning. Typically patient is able to conversate to a certain degree. She has dementia at baseline so her mental status is tenuous. Patient has also had decreased by mouth intake and urinary output. Daughter reports intermittent fevers, with a temp of 100 yesterday. Patient has not taken anything for her fever or other symptoms. Daughter reports compliance with sodium tablets. No reported seizure-like activity.  HPI  Past Medical History:  Diagnosis Date  . Diabetes mellitus   . Hyperlipidemia   . Seizure (HCC)   . Stroke [facility]) 2012   possible stroke Jan 2016    Patient Active Problem List   Diagnosis Date Noted  . Heel ulcer (HCC) 06/18/2017  . Unstageable pressure ulcer of sacral region (HCC) 06/18/2017  . Decubitus ulcer of sacral region, stage 2   . Idiopathic hypotension   . Seizure (HCC) 05/21/2017  . Acute renal failure superimposed on stage 3 chronic kidney disease (HCC) 10/23/2014  . Dehydration 10/23/2014  . Acute hyperkalemia 10/23/2014  . Abnormal urinalysis 10/23/2014  . Chest pain 04/24/2014  . Altered mental status 08/08/2013  . Dementia with Lewy bodies 08/07/2013  . Vascular dementia 08/07/2013  . Sleepiness 08/07/2013  . Anemia 07/21/2013  . Protein-calorie malnutrition, severe (HCC) 07/15/2013  . Herpes zoster 07/13/2013  . Spastic hemiplegia  affecting dominant side (HCC) 06/25/2013  . History of CVA w/residual right side deficits 09/03/2012  . History of partial seizures   . TIA (transient ischemic attack) 01/06/2012  . Community acquired pneumonia 01/06/2012  . Diabetes mellitus type II, uncontrolled (HCC) 01/06/2012  . HTN (hypertension) 01/06/2012  . H/O 01/06/2012    Past Surgical History:  Procedure Laterality Date  . CATARACT EXTRACTION      OB History    No data available       Home Medications    Prior to Admission medications   Medication Sig Start Date End Date Taking? Authorizing Provider  clopidogrel (PLAVIX) 75 MG tablet Take 1 tablet (75 mg total) by mouth daily with breakfast. 08/15/13   [name], MD  divalproex (DEPAKOTE ER) 500 MG 24 hr tablet Take 1 tablet (500 mg total) by mouth at bedtime. 07/14/16   Micki Riley, MD  guaiFENesin (MUCINEX) 600 MG 12 hr tablet Take 600 mg by mouth 2 (two) times daily as needed for to loosen phlegm.    [provider]  Melatonin 5 MG TABS Take 5 mg by mouth at bedtime as needed (sleep).    [provider]  memantine (NAMENDA XR) 14 MG CP24 24 hr capsule Take 14 mg by mouth daily.    [provider]  NYSTATIN powder Apply 1 application topically 2 (two) times daily as needed for rash. 05/04/17   [provider]  pantoprazole (PROTONIX) 40 MG tablet Take 40 mg by mouth every morning.     [provider]  protein supplement shake (PREMIER PROTEIN)  LIQD Take 2 oz by mouth 4 (four) times daily as needed (meal replacement).    [provider]  rosuvastatin (CRESTOR) 5 MG tablet Take 5 mg by mouth every Monday, Wednesday, and Friday.    [provider]  TOUJEO SOLOSTAR 300 UNIT/ML SOPN Inject 14 Units as directed every evening. 06/21/17   Lonia BloodMcClung, Jeffrey T, MD  zinc oxide (BALMEX) 11.3 % CREA cream Apply 1 application topically daily.    [provider]    Family History Family History    Problem Relation Age of Onset  . CAD Mother   . Hypertension Mother   . Diabetes Mother   . Stroke Mother     Social History Social History   Tobacco Use  . Smoking status: Never Smoker  . Smokeless tobacco: Former NeurosurgeonUser    Types: Snuff  . Tobacco comment: quit 50-60 years ago  Substance Use Topics  . Alcohol use: No    Alcohol/week: 0.0 oz  . Drug use: No     Allergies   Lipitor [atorvastatin]   Review of Systems Review of Systems  Unable to perform ROS: Mental status change     Physical Exam Updated Vital Signs BP (!) 148/93   Pulse 87   Temp 98.8 F (37.1 C) (Oral)   Resp 20   SpO2 98%   Physical Exam  Constitutional: No distress.  Elderly, frail  HENT:  Head: Normocephalic and atraumatic.  Mouth/Throat: No oropharyngeal exudate.  Hard of hearing  Eyes: Conjunctivae and EOM are normal. Right eye exhibits no discharge. Left eye exhibits no discharge. No scleral icterus.  Cardiovascular: Normal rate, regular rhythm and intact distal pulses. Exam reveals no gallop and no friction rub.  Pulmonary/Chest: Effort normal and breath sounds normal. No respiratory distress. She has no wheezes. She has no rales. She exhibits no tenderness.  Abdominal: Soft. She exhibits no distension. There is no tenderness. There is no guarding.  Musculoskeletal: She exhibits no edema.  Neurological:  Lethargic, does not follow commands. Will open eyes to verbal stimuli. R sided hemiparesis with RUE contracture  Skin: Skin is warm and dry. No rash noted. She is not diaphoretic. No erythema. No pallor.  Psychiatric: She has a normal mood and affect. Her behavior is normal.  Nursing note and vitals reviewed.    ED Treatments / Results  Labs (all labs ordered are listed, but only abnormal results are displayed) Labs Reviewed  COMPREHENSIVE METABOLIC PANEL - Abnormal; Notable for the following components:      Result Value   Sodium 133 (*)    Chloride 99 (*)    Glucose, Bld  165 (*)    BUN 21 (*)    Creatinine, Ser 1.04 (*)    Albumin 2.5 (*)    GFR calc non Af Amer 45 (*)    GFR calc Af Amer 52 (*)    All other components within normal limits  CBC - Abnormal; Notable for the following components:   WBC 14.2 (*)    Hemoglobin 11.3 (*)    HCT 35.6 (*)    RDW 17.4 (*)    All other components within normal limits  I-STAT CG4 LACTIC ACID, ED - Abnormal; Notable for the following components:   Lactic Acid, Venous 2.13 (*)    All other components within normal limits  CULTURE, BLOOD (ROUTINE X 2)  CULTURE, BLOOD (ROUTINE X 2)  URINALYSIS, ROUTINE W REFLEX MICROSCOPIC  VALPROIC ACID LEVEL  CBG MONITORING, ED  EKG  EKG Interpretation None       Radiology Ct Head Wo Contrast  Result Date: 10/26/2017 CLINICAL DATA:  Altered mental status, newly nonverbal today. EXAM: CT HEAD WITHOUT CONTRAST TECHNIQUE: Contiguous axial images were obtained from the base of the skull through the vertex without intravenous contrast. COMPARISON:  06/18/2017. FINDINGS: Brain: No definite evidence of an acute infarct, acute hemorrhage, mass lesion, mass effect or hydrocephalus. Atrophy. Periventricular low attenuation. Probable remote infarcts in the basal ganglia bilaterally. Vascular: No hyperdense vessel or unexpected calcification. Skull: Normal. Negative for fracture or focal lesion. Sinuses/Orbits: No acute finding. Other: Right mastoid effusion. IMPRESSION: 1. No acute intracranial abnormality. 2. Atrophy and chronic microvascular white matter ischemic changes. 3. Remote basal ganglia lacunar infarcts. 4. Right mastoid effusion. Electronically Signed   By: Leanna Battles M.D.   On: 10/26/2017 13:16    Procedures Procedures (including critical care time)  Medications Ordered in ED Medications - No data to display   Initial Impression / Assessment and Plan / ED Course  I have reviewed the triage vital signs and the nursing notes.  Pertinent labs & imaging results  that were available during my care of the patient were reviewed by me and considered in my medical decision making (see chart for details).     84 year old female with history of dementia, seizure disorder who presented to the ED today brought in by her family for altered mental status. Patient has not been verbally responsive in the last 2 days which is not normal for her, decreased PO intake. Pt does have hx of hyponatremia encephalopathy, but family reports that she has been taking her sodium tablets. DDx includes seizure, CVA, hyponatremia, UTI, other source of infection I.e PNA. Pt afebrile and other vitals are stable.   CT head unremarkable for acute abnormality. Na 133.  Cr slightly elevated, it apears that pt is dehydrated. Will give IV fluids CXR shows RUL pneumonia.Will start IV antibiotics. PORT score113, feel that pt warrants hospitalization for this. Will consult hospitalist for admission.   Pt signed out to [employer] at shift change pending admission.   Final Clinical Impressions(s) / ED Diagnoses   Final diagnoses:  None    ED Discharge Orders    None       Christin Moline, Lester Kinsman, PA-C 10/26/17 [address], Lester Kinsman, [place] 10/26/17 1806

## 2017-10-27 DIAGNOSIS — G3183 Dementia with Lewy bodies: Secondary | ICD-10-CM

## 2017-10-27 DIAGNOSIS — Z8673 Personal history of transient ischemic attack (TIA), and cerebral infarction without residual deficits: Secondary | ICD-10-CM

## 2017-10-27 DIAGNOSIS — F0280 Dementia in other diseases classified elsewhere without behavioral disturbance: Secondary | ICD-10-CM

## 2017-10-27 DIAGNOSIS — G9341 Metabolic encephalopathy: Secondary | ICD-10-CM

## 2017-10-27 LAB — BLOOD CULTURE ID PANEL (REFLEXED)

## 2017-10-27 LAB — COMPREHENSIVE METABOLIC PANEL
ALT: 10 U/L — AB (ref 14–54)
AST: 20 U/L (ref 15–41)
Albumin: 2.1 g/dL — ABNORMAL LOW (ref 3.5–5.0)
Alkaline Phosphatase: 93 U/L (ref 38–126)
Anion gap: 11 (ref 5–15)
BUN: 13 mg/dL (ref 6–20)
CHLORIDE: 105 mmol/L (ref 101–111)
CO2: 19 mmol/L — AB (ref 22–32)
CREATININE: 0.83 mg/dL (ref 0.44–1.00)
Calcium: 9.2 mg/dL (ref 8.9–10.3)
GFR, EST NON AFRICAN AMERICAN: 59 mL/min — AB (ref >60)
Glucose, Bld: 95 mg/dL (ref 65–99)
Potassium: 4.4 mmol/L (ref 3.5–5.1)
Sodium: 135 mmol/L (ref 135–145)
Total Bilirubin: 0.4 mg/dL (ref 0.3–1.2)
Total Protein: 6.9 g/dL (ref 6.5–8.1)

## 2017-10-27 LAB — STREP PNEUMONIAE URINARY ANTIGEN: Strep Pneumo Urinary Antigen: NEGATIVE

## 2017-10-27 LAB — GLUCOSE, CAPILLARY
GLUCOSE-CAPILLARY: 165 mg/dL — AB (ref 65–99)
GLUCOSE-CAPILLARY: 174 mg/dL — AB (ref 65–99)
GLUCOSE-CAPILLARY: 181 mg/dL — AB (ref 65–99)
Glucose-Capillary: 103 mg/dL — ABNORMAL HIGH (ref 65–99)
Glucose-Capillary: 96 mg/dL (ref 65–99)
Glucose-Capillary: 187 mg/dL — ABNORMAL HIGH (ref 65–99)

## 2017-10-27 LAB — CBC
HCT: 36.3 % (ref 36.0–46.0)
HEMOGLOBIN: 11.5 g/dL — AB (ref 12.0–15.0)
MCH: 27.6 pg (ref 26.0–34.0)
MCHC: 31.7 g/dL (ref 30.0–36.0)
MCV: 87.1 fL (ref 78.0–100.0)
PLATELETS: 176 10*3/uL (ref 150–400)
RBC: 4.17 MIL/uL (ref 3.87–5.11)
RDW: 17.2 % — ABNORMAL HIGH (ref 11.5–15.5)
WBC: 10.5 10*3/uL (ref 4.0–10.5)

## 2017-10-27 LAB — HIV ANTIBODY (ROUTINE TESTING W REFLEX): HIV SCREEN 4TH GENERATION: NONREACTIVE

## 2017-10-27 NOTE — Progress Notes (Signed)
Alen BlewNancy, Legend 84 y o f patient admitted from ED. Patient is alert and oriented x1. Vital signs are stable. Skin assessment done with another nurse. Patient's family member given instruction about call bell, phone and unit routine. Bed in low position and side rail up x2. Call bell in reach. Patient's family member is in bed side.

## 2017-10-27 NOTE — Progress Notes (Signed)
PHARMACY - PHYSICIAN COMMUNICATION CRITICAL VALUE ALERT - BLOOD CULTURE IDENTIFICATION (BCID)  Megan [name] is an 84 y.o. female who [facility] 10[facility]26/2017 with a chief complaint of AMS  Assessment: [id number] YOF currently on antibiotics to cover for UTI/CAP. Now with 1 of 2 blood cultures growing Coag Neg Staph - likely representative of a contaminant.   Name of physician (or Provider) ContactedCaleb Popp: Nettey  Current antibiotics: Rocephin and Azithromycin  Changes to prescribed antibiotics recommended:  Patient is on recommended antibiotics - No changes needed  Results for orders placed or performed during the hospital encounter of 10/26/17  Blood Culture ID Panel (Reflexed) (Collected: 10/26/2017  2:37 PM)  Result Value Ref Range   Enterococcus species NOT DETECTED NOT DETECTED   Listeria monocytogenes NOT DETECTED NOT DETECTED   Staphylococcus species DETECTED (A) NOT DETECTED   Staphylococcus aureus NOT DETECTED NOT DETECTED   Methicillin resistance NOT DETECTED NOT DETECTED   Streptococcus species NOT DETECTED NOT DETECTED   Streptococcus agalactiae NOT DETECTED NOT DETECTED   Streptococcus pneumoniae NOT DETECTED NOT DETECTED   Streptococcus pyogenes NOT DETECTED NOT DETECTED   Acinetobacter baumannii NOT DETECTED NOT DETECTED   Enterobacteriaceae species NOT DETECTED NOT DETECTED   Enterobacter cloacae complex NOT DETECTED NOT DETECTED   Escherichia coli NOT DETECTED NOT DETECTED   Klebsiella oxytoca NOT DETECTED NOT DETECTED   Klebsiella pneumoniae NOT DETECTED NOT DETECTED   Proteus species NOT DETECTED NOT DETECTED   Serratia marcescens NOT DETECTED NOT DETECTED   Haemophilus influenzae NOT DETECTED NOT DETECTED   Neisseria meningitidis NOT DETECTED NOT DETECTED   Pseudomonas aeruginosa NOT DETECTED NOT DETECTED   Candida albicans NOT DETECTED NOT DETECTED   Candida glabrata NOT DETECTED NOT DETECTED   Candida krusei NOT DETECTED NOT DETECTED   Candida parapsilosis NOT  DETECTED NOT DETECTED   Candida tropicalis NOT DETECTED NOT DETECTED    Rolley SimsMartin, Sumeet Geter Ann 10/27/2017  12:14 PM

## 2017-10-27 NOTE — Evaluation (Signed)
Physical Therapy Evaluation Patient Details Name: Megan [name] MRN: [id number] DOB: [birth year] Today's Date: 10/27/2017   History of Present Illness  Pt is a 84 y/o female admitted secondary to AMS. Imaging revealed R upper lobe pneumonia. Pt also found to have UTI. CT of the head negative for acute abnormality. PMH includes CVA with R sided deficits, dementia, seizures, DM, and sacral pressure ulcer.   Clinical Impression  Pt admitted secondary to problem above with deficits below. Pt with cognitive deficits, weakness (R>L at baseline) and decreased balance. Required heavy max to total assist to perform mobility this session. Pt's daughter reports pt is requiring increased assist than she was PTA. Feel pt is a high fall risk. Feel pt would benefit from SNF prior to return home given current decline in mobility to increase safety with mobility. Will continue to follow acutely to maximize functional mobility independence and safety.     Follow Up Recommendations SNF;Supervision/Assistance - 24 hour    Equipment Recommendations  Other (comment)(hoyer lift; hoyer lift pad)    Recommendations for Other Services       Precautions / Restrictions Precautions Precautions: Fall Restrictions Weight Bearing Restrictions: No      Mobility  Bed Mobility Overal bed mobility: Needs Assistance Bed Mobility: Supine to Sit;Sit to Supine     Supine to sit: Total assist Sit to supine: Total assist;+2 for physical assistance   General bed mobility comments: Total assist to come up to sitting. Pt not assisting with transfer to come up to sitting. Required total assist +2 to return to supine. Daughter assisted with transfer to supine.   Transfers Overall transfer level: Needs assistance Equipment used: None Transfers: Sit to/from Stand Sit to Stand: Total assist;Max assist         General transfer comment: Sit<>Stand X 2. Pt assisting some during first attempt, however, required heavy max A and  manual blocking on bilat knees. Total assist for second attempt to stand.   Ambulation/Gait             General Gait Details: unable  Stairs            Wheelchair Mobility    Modified Rankin (Stroke Patients Only)       Balance Overall balance assessment: Needs assistance Sitting-balance support: No upper extremity supported;Feet supported Sitting balance-Leahy Scale: Fair Sitting balance - Comments: Periods of supervision in sitting. Min guard for safety and min A for midline orientation. Cues for cervical extension.    Standing balance support: No upper extremity supported Standing balance-Leahy Scale: Zero Standing balance comment: Heavy max to total assist to stand.                              Pertinent Vitals/Pain Pain Assessment: Faces Faces Pain Scale: No hurt    Home Living Family/patient expects to be discharged to:: Private residence Living Arrangements: Children Available Help at Discharge: Family;Available 24 hours/day Type of Home: House Home Access: Level entry     Home Layout: One level Home Equipment: Walker - 2 wheels;Bedside commode;Wheelchair - manual      Prior Function Level of Independence: Needs assistance   Gait / Transfers Assistance Needed: Needs help to stand and pivot to WC and to [facility].   ADL's / Homemaking Assistance Needed: Daughter assists with bathing and dressing.         Hand Dominance   Dominant Hand: Right    Extremity/Trunk Assessment   Upper  Extremity Assessment Upper Extremity Assessment: Generalized weakness    Lower Extremity Assessment Lower Extremity Assessment: RLE deficits/detail RLE Deficits / Details: RLE weakness at baseline. Unable to perform AROM in RLE.     Cervical / Trunk Assessment Cervical / Trunk Assessment: Kyphotic  Communication   Communication: Expressive difficulties;HOH(daughter writes on paper to communicate)  Cognition Arousal/Alertness: Awake/alert Behavior  During Therapy: Flat affect Overall Cognitive Status: History of cognitive impairments - at baseline                                        General Comments General comments (skin integrity, edema, etc.): Pt's daughter present in room. Reports mother was able to assist more prior to admission.     Exercises General Exercises - Lower Extremity Ankle Circles/Pumps: AROM;Left;10 reps Heel Slides: AAROM;Left;PROM;Right;10 reps Hip ABduction/ADduction: AAROM;Left;PROM;Right;10 reps   Assessment/Plan    PT Assessment Patient needs continued PT services  PT Problem List Decreased strength;Decreased balance;Decreased mobility;Decreased cognition;Decreased knowledge of use of DME;Decreased safety awareness;Decreased knowledge of precautions       PT Treatment Interventions Functional mobility training;Therapeutic activities;Therapeutic exercise;Balance training;Neuromuscular re-education;Cognitive remediation;Patient/family education    PT Goals (Current goals can be found in the Care Plan section)  Acute Rehab PT Goals Patient Stated Goal: to go home per daughter  PT Goal Formulation: With family Time For Goal Achievement: 11/10/17 Potential to Achieve Goals: Fair    Frequency Min 3X/week   Barriers to discharge        Co-evaluation               AM-PAC PT "6 Clicks" Daily Activity  Outcome Measure Difficulty turning over in bed (including adjusting bedclothes, sheets and blankets)?: Unable Difficulty moving from lying on back to sitting on the side of the bed? : Unable Difficulty sitting down on and standing up from a chair with arms (e.g., wheelchair, bedside commode, etc,.)?: Unable Help needed moving to and from a bed to chair (including a wheelchair)?: Total Help needed walking in hospital room?: Total Help needed climbing 3-5 steps with a railing? : Total 6 Click Score: 6    End of Session Equipment Utilized During Treatment: Gait belt Activity  Tolerance: Patient tolerated treatment well Patient left: in bed;with call bell/phone within reach;with bed alarm set;with family/visitor present Nurse Communication: Mobility status;Other (comment)(pt's daughter requesting purwick) PT Visit Diagnosis: Unsteadiness on feet (R26.81);Other abnormalities of gait and mobility (R26.89);Muscle weakness (generalized) (M62.81)    Time: [id number] PT Time Calculation (min) (ACUTE ONLY): 28 min   Charges:   PT Evaluation $PT Eval Moderate Complexity: 1 Mod PT Treatments $Therapeutic Activity: 8-22 mins   PT G Codes:        Gladys DammeBrittany Rhythm Wigfall, PT, DPT  Acute Rehabilitation Services  Pager: [phone]   Lehman PromBrittany S Sharmayne Jablon 10/27/2017, 6:26 PM

## 2017-10-27 NOTE — Progress Notes (Signed)
PROGRESS NOTE    Megan [name]  ZOX:[id number] DOB: [birth year] DOA: 10/26/2017 PCP: [name], MD   Brief Narrative: Megan [name] is a 84 y.o. female, w Hyperlipidemia, Dm2, CVA, seizure disorder, hyponatremia. She presents with CAP and UTI. Currently on ceftriaxone and azithromycin.   Assessment & Plan:   Principal Problem:   CAP (community acquired pneumonia) Active Problems:   History of CVA w/residual right side deficits   Dementia with Lewy bodies   Altered mental state   Hyponatremia   Pressure injury of skin   UTI (urinary tract infection)   Community acquired pneuomina Right upper lobe pneumonia Afebrile. Blood culture, legionella and strep urine pending -Continue Ceftriaxone and Azithromycin -PT/OT eval  Urinary tract infection Symptoms of altered mental status. Urinalysis suggests infection. -Continue ceftriaxone as above  Acute metabolic encephalopathy Secondary to above infections. Appears resolved.  Hyponatremia Resolved with IV fluids.  Seizure disorder Stable. -continue Depakote  GERD Patient takes Protonix as an outpatient. Increases risk for pneumonia from aspiration.  Diabetes mellitus, type 2 -Continue Tougeo and SSI  History of CVA Residual right sided weakness and dysarthria -Continue Crestor and Plavix  Dementia -Continue Namenda  Cough Present especially with eating. Concern for dysphagia possibly causing aspiration. -SLP evaluation   DVT prophylaxis: Lovenox Code Status:   Code Status: DNR Family Communication: Daughter at bedside Disposition Plan: Discharge in 24-48 hours to home vs SNF pending treatment of PNA and UTI and culture results.   Consultants:   None  Procedures:   None  Antimicrobials:  Ceftriaxone  Azithromycin    Subjective: No issues overnight.  Objective: Vitals:   10/26/17 1600 10/26/17 1939 10/26/17 2026 10/27/17 0415  BP: 121/73 (!) 134/59 (!) 157/59 (!) 138/41  Pulse: 92 89 91 81    Resp: 20 20 18 18   Temp:   98.7 F (37.1 C) 98.1 F (36.7 C)  TempSrc:   Oral Tympanic  SpO2: 98% 99% 100% 100%  Weight:   67.3 kg (148 lb 5.9 oz)   Height:   5\' 2"  (1.575 m)     Intake/Output Summary (Last 24 hours) at 10/27/2017 0946 Last data filed at 10/27/2017 0814 Gross per 24 hour  Intake 702.5 ml  Output 800 ml  Net -97.5 ml   Filed Weights   10/26/17 2026  Weight: 67.3 kg (148 lb 5.9 oz)    Examination:  General exam: Appears calm and comfortable Respiratory system: Right upper lobe crackles on anterior auscultation. Generally diminished on anterior auscultation. Respiratory effort normal. Cardiovascular system: S1 & S2 heard, RRR. No murmurs, rubs, gallops or clicks. Gastrointestinal system: Abdomen is nondistended, soft and nontender. No organomegaly or masses felt. Normal bowel sounds heard. Central nervous system: Alert. Right sided weakness compared to left. Hearing is significantly limited. Extremities: No edema. No calf tenderness Skin: No cyanosis. No rashes Psychiatry: Judgement and insight appear poor. Flat affect.     Data Reviewed: I have personally reviewed following labs and imaging studies  CBC: Recent Labs  Lab 10/26/17 1213  WBC 14.2*  HGB 11.3*  HCT 35.6*  MCV 88.1  PLT 283   Basic Metabolic Panel: Recent Labs  Lab 10/26/17 1213 10/27/17 0611  NA 133* 135  K 4.7 4.4  CL 99* 105  CO2 24 19*  GLUCOSE 165* 95  BUN 21* 13  CREATININE 1.04* 0.83  CALCIUM 9.9 9.2   GFR: Estimated Creatinine Clearance: 38.1 mL/min (by C-G formula based on SCr of 0.83 mg/dL). Liver Function Tests: Recent Labs  Lab 10/26/17 1213 10/27/17 0611  AST 23 20  ALT 14 10*  ALKPHOS 119 93  BILITOT 0.4 0.4  PROT 7.6 6.9  ALBUMIN 2.5* 2.1*   No results for input(s): LIPASE, AMYLASE in the last 168 hours. No results for input(s): AMMONIA in the last 168 hours. Coagulation Profile: No results for input(s): INR, PROTIME in the last 168 hours. Cardiac  Enzymes: No results for input(s): CKTOTAL, CKMB, CKMBINDEX, TROPONINI in the last 168 hours. BNP (last 3 results) No results for input(s): PROBNP in the last 8760 hours. HbA1C: No results for input(s): HGBA1C in the last 72 hours. CBG: Recent Labs  Lab 10/26/17 1940 10/26/17 2053 10/27/17 0016 10/27/17 0414 10/27/17 0752  GLUCAP 188* 164* 174* 103* 96   Lipid Profile: No results for input(s): CHOL, HDL, LDLCALC, TRIG, CHOLHDL, LDLDIRECT in the last 72 hours. Thyroid Function Tests: No results for input(s): TSH, T4TOTAL, FREET4, T3FREE, THYROIDAB in the last 72 hours. Anemia Panel: No results for input(s): VITAMINB12, FOLATE, FERRITIN, TIBC, IRON, RETICCTPCT in the last 72 hours. Sepsis Labs: Recent Labs  Lab 10/26/17 1404 10/26/17 1642  LATICACIDVEN 2.13* 1.35    No results found for this or any previous visit (from the past 240 hour(s)).       Radiology Studies: Dg Chest 2 View  Result Date: 10/26/2017 CLINICAL DATA:  Altered mental status, hypotension, fatigue, diabetes mellitus, history of stroke and seizures EXAM: CHEST - 2 VIEW COMPARISON:  06/18/2017 FINDINGS: Minimal enlargement of cardiac silhouette. Atherosclerotic calcification aorta. RIGHT upper lobe airspace opacity consistent with pneumonia. BILATERAL lower lobe opacities which may represent atelectasis or known fibrosis though mild superimposed acute infiltrates are not completely excluded. Upper LEFT lung clear. No pleural effusion or pneumothorax. No acute osseous findings. IMPRESSION: RIGHT upper lobe pneumonia. Chronic bibasilar opacities favoring fibrosis and atelectasis. Electronically Signed   By: Ulyses Southward M.D.   On: 10/26/2017 15:54   Ct Head Wo Contrast  Result Date: 10/26/2017 CLINICAL DATA:  Altered mental status, newly nonverbal today. EXAM: CT HEAD WITHOUT CONTRAST TECHNIQUE: Contiguous axial images were obtained from the base of the skull through the vertex without intravenous contrast.  COMPARISON:  06/18/2017. FINDINGS: Brain: No definite evidence of an acute infarct, acute hemorrhage, mass lesion, mass effect or hydrocephalus. Atrophy. Periventricular low attenuation. Probable remote infarcts in the basal ganglia bilaterally. Vascular: No hyperdense vessel or unexpected calcification. Skull: Normal. Negative for fracture or focal lesion. Sinuses/Orbits: No acute finding. Other: Right mastoid effusion. IMPRESSION: 1. No acute intracranial abnormality. 2. Atrophy and chronic microvascular white matter ischemic changes. 3. Remote basal ganglia lacunar infarcts. 4. Right mastoid effusion. Electronically Signed   By: Leanna Battles M.D.   On: 10/26/2017 13:16        Scheduled Meds: . clopidogrel  75 mg Oral Q breakfast  . divalproex  500 mg Oral QHS  . enoxaparin (LOVENOX) injection  30 mg Subcutaneous Q24H  . ferrous sulfate  325 mg Oral Q breakfast  . insulin aspart  0-9 Units Subcutaneous Q4H  . memantine  14 mg Oral Daily  . pantoprazole  40 mg Oral q morning - 10a  . rosuvastatin  5 mg Oral Q M,W,F  . sodium chloride  1 g Oral Daily  . vitamin C  500 mg Oral Daily  . zinc oxide  1 application Topical Daily   Continuous Infusions: . azithromycin    . cefTRIAXone (ROCEPHIN)  IV       LOS: 1 day  Jacquelin Hawkingalph Zeppelin Beckstrand, MD [facility] 10/27/2017, 9:46 AM Pager: [phone]  If 7PM-7AM, please contact night-coverage [email or web address] Password [email or web address] 10/27/2017, 9:46 AM

## 2017-10-27 NOTE — Evaluation (Signed)
Clinical/Bedside Swallow Evaluation Patient Details  Name: Megan [name] MRN: [id number] Date of Birth: 1922/06/24  Today's Date: 10/27/2017 Time: SLP Start Time (ACUTE ONLY): 1445 SLP Stop Time (ACUTE ONLY): 1505 SLP Time Calculation (min) (ACUTE ONLY): 20 min  Past Medical History:  Past Medical History:  Diagnosis Date  . Diabetes mellitus   . Hyperlipidemia   . Seizure (HCC)   . Stroke [facility]) 2012   possible stroke Jan 2016   Past Surgical History:  Past Surgical History:  Procedure Laterality Date  . CATARACT EXTRACTION     HPI:  Megan Nanceis an 84 y.o. femalewho presented to [facility] on 3/6/2019with a chief complaint of AMS. Presents with CAP (RUL) and UTI.  H/o CVA with residual right side deficits, dementia, GERD. Swallowing evaluated in 2013 by this SLP and documentation reveals a mild oropharyngeal dysphagia with seemingly good airway protection, recommended a regular diet, thin liquid. Evaluated again in 2014 with recommendations for pureed solids, thin liquid.    Assessment / Plan / Recommendation Clinical Impression  Swallowing function appears to be at baseline based of family report and documentation from previous admissions. Oral phase with prolonged oral transit of bolus and initiation of swallow, average of 10 seconds although improving to closer to 5 seconds as po trials progressed, but patient without overt indication of aspiration. Verbal output limited however vocal quality noted to remain clear. Cannot r/o episodic aspiration, largely due to fluctutations in awareness, although baseline diet (puree, thin liquid) appears to remain appropriate. SLP will f/u for diet tolerance and in light of acute right sided PNA, need for further intervention.  SLP Visit Diagnosis: Dysphagia, oropharyngeal phase (R13.12)    Aspiration Risk  Moderate aspiration risk    Diet Recommendation Dysphagia 1 (Puree);Thin liquid   Liquid Administration via: Cup;Straw Medication  Administration: Crushed with puree Supervision: Full supervision/cueing for compensatory strategies;Staff to assist with self feeding Compensations: Slow rate;Small sips/bites Postural Changes: Seated upright at 90 degrees    Other  Recommendations Oral Care Recommendations: Oral care BID   Follow up Recommendations None      Frequency and Duration min 2x/week  1 week           Swallow Study   General HPI: Megan Nanceis an 84 y.o. femalewho presented [facility] on 3/6/2019with a chief complaint of AMS. Presents with CAP (RUL) and UTI.  H/o CVA with residual right side deficits, dementia, GERD. Swallowing evaluated in 2013 by this SLP and documentation reveals a mild oropharyngeal dysphagia with seemingly good airway protection, recommended a regular diet, thin liquid. Evaluated again in 2014 with recommendations for pureed solids, thin liquid.  Type of Study: Bedside Swallow Evaluation Previous Swallow Assessment: see HPI Diet Prior to this Study: Regular;Thin liquids Temperature Spikes Noted: No Respiratory Status: Room air History of Recent Intubation: No Behavior/Cognition: Alert;Cooperative;Pleasant mood;Distractible;Requires cueing;Doesn't follow directions Oral Cavity Assessment: Within Functional Limits Oral Care Completed by SLP: Recent completion by staff Vision: Functional for self-feeding Self-Feeding Abilities: Needs assist Patient Positioning: Upright in bed Baseline Vocal Quality: Normal(limited verbal output) Volitional Cough: Cognitively unable to elicit Volitional Swallow: Unable to elicit    Oral/Motor/Sensory Function Overall Oral Motor/Sensory Function: Other (comment)(appears WFL although unable to formally assess)   Ice Chips Ice chips: Not tested   Thin Liquid Thin Liquid: Within functional limits Presentation: Straw    Nectar Thick Nectar Thick Liquid: Not tested   Honey Thick Honey Thick Liquid: Not tested   Puree Puree: Impaired Presentation:  Spoon Oral  Phase Impairments: Impaired mastication Oral Phase Functional Implications: Prolonged oral transit   Solid   Rainer Mounce MA, CCC-SLP [phone]    Solid: Not tested        Ferdinand LangoMcCoy Brach Birdsall [name] 10/27/2017,3:12 PM

## 2017-10-28 LAB — GLUCOSE, CAPILLARY
GLUCOSE-CAPILLARY: 212 mg/dL — AB (ref 65–99)
Glucose-Capillary: 105 mg/dL — ABNORMAL HIGH (ref 65–99)
Glucose-Capillary: 135 mg/dL — ABNORMAL HIGH (ref 65–99)
Glucose-Capillary: 140 mg/dL — ABNORMAL HIGH (ref 65–99)
Glucose-Capillary: 180 mg/dL — ABNORMAL HIGH (ref 65–99)
Glucose-Capillary: 222 mg/dL — ABNORMAL HIGH (ref 65–99)

## 2017-10-28 MED ORDER — PREMIER PROTEIN SHAKE: 2.0000 [oz_av] | Freq: Three times a day (TID) | ORAL | 0 refills | Status: AC

## 2017-10-28 MED ORDER — FAMOTIDINE 20 MG PO TABS: 20.0000 mg | ORAL_TABLET | Freq: Every day | ORAL | 0 refills | Status: AC

## 2017-10-28 MED ORDER — AZITHROMYCIN 500 MG PO TABS
500.0000 mg | ORAL_TABLET | Freq: Once | ORAL | Status: AC
  Administered 2017-10-28: 500 mg via ORAL
  Filled 2017-10-28: qty 1

## 2017-10-28 MED ORDER — POLYETHYLENE GLYCOL 3350 17 G PO PACK: 17.0000 g | PACK | Freq: Every day | ORAL | 0 refills | Status: DC

## 2017-10-28 MED ORDER — CEFPODOXIME PROXETIL 200 MG PO TABS
200.0000 mg | ORAL_TABLET | Freq: Two times a day (BID) | ORAL | Status: DC
  Administered 2017-10-28 – 2017-10-29 (×2): 200 mg via ORAL
  Filled 2017-10-28 (×2): qty 1

## 2017-10-28 MED ORDER — SODIUM CHLORIDE 0.9 % IV BOLUS (SEPSIS)
500.0000 mL | Freq: Once | INTRAVENOUS | Status: AC
  Administered 2017-10-28: 500 mL via INTRAVENOUS

## 2017-10-28 MED ORDER — FAMOTIDINE 20 MG PO TABS
20.0000 mg | ORAL_TABLET | Freq: Every day | ORAL | Status: DC
Start: 2017-10-28 — End: 2017-10-29
  Administered 2017-10-28 – 2017-10-29 (×2): 20 mg via ORAL
  Filled 2017-10-28 (×2): qty 1

## 2017-10-28 MED ORDER — PRO-STAT SUGAR FREE PO LIQD: 30.0000 mL | Freq: Three times a day (TID) | ORAL | 2 refills | Status: AC

## 2017-10-28 MED ORDER — CEFPODOXIME PROXETIL 200 MG PO TABS: 200.0000 mg | ORAL_TABLET | Freq: Two times a day (BID) | ORAL | 0 refills | Status: DC

## 2017-10-28 MED ORDER — AZITHROMYCIN 250 MG PO TABS
250.0000 mg | ORAL_TABLET | Freq: Every day | ORAL | Status: DC
  Administered 2017-10-29: 250 mg via ORAL
  Filled 2017-10-28: qty 1

## 2017-10-28 MED ORDER — AZITHROMYCIN 500 MG PO TABS: 500.0000 mg | ORAL_TABLET | Freq: Every day | ORAL | 0 refills | Status: DC

## 2017-10-28 MED ORDER — GERHARDT'S BUTT CREAM
TOPICAL_CREAM | Freq: Two times a day (BID) | CUTANEOUS | Status: DC
  Administered 2017-10-28 – 2017-10-29 (×3): via TOPICAL
  Filled 2017-10-28: qty 1

## 2017-10-28 NOTE — Progress Notes (Signed)
NCM called to pt's room to speak with daughter Dondra SpryGail concerning mom d/c for today. Dondra SpryGail stated she's unable to take mom home this evening, however, states the support needed will be available in the am from family. CM made MD and nurse aware. Pt will need PTAR called and arranged in am. Gae GallopAngela Lorra Freeman RN,CM

## 2017-10-28 NOTE — NC FL2 (Signed)
[place] MEDICAID FL2 LEVEL OF CARE SCREENING TOOL     IDENTIFICATION  Patient Name: Megan [name] Birthdate: May 04, 1922 Sex: female Admission Date (Current Location): 10/26/2017  [facility] [place] Number:  Producer, television/film/video and Address:  [facility], [address], [place], [place] [address]      Provider Number: [id number]  Attending Physician Name and Address:  Narda Bonds, MD  Relative Name and Phone Number:  Dondra Spry daughter, [phone]    Current Level of Care: Hospital Recommended Level of Care: Skilled Nursing Facility Prior Approval Number:    Date Approved/Denied:   PASRR Number: [id number]  Discharge Plan: SNF    Current Diagnoses: Patient Active Problem List   Diagnosis Date Noted  . Pressure injury of skin 10/26/2017  . UTI (urinary tract infection) 10/26/2017  . Heel ulcer (HCC) 06/18/2017  . Unstageable pressure ulcer of sacral region (HCC) 06/18/2017  . Decubitus ulcer of sacral region, stage 2   . Idiopathic hypotension   . Seizure (HCC) 05/21/2017  . Hyponatremia 05/12/2017  . Acute renal failure superimposed on stage 3 chronic kidney disease (HCC) 10/23/2014  . Dehydration 10/23/2014  . Acute hyperkalemia 10/23/2014  . Abnormal urinalysis 10/23/2014  . Chest pain 04/24/2014  . Altered mental state 08/08/2013  . Dementia with Lewy bodies 08/07/2013  . Vascular dementia 08/07/2013  . Sleepiness 08/07/2013  . Anemia 07/21/2013  . Protein-calorie malnutrition, severe (HCC) 07/15/2013  . Herpes zoster 07/13/2013  . Spastic hemiplegia affecting dominant side (HCC) 06/25/2013  . History of CVA w/residual right side deficits 09/03/2012  . History of partial seizures   . TIA (transient ischemic attack) 01/06/2012  . CAP (community acquired pneumonia) 01/06/2012  . Diabetes mellitus type II, uncontrolled (HCC) 01/06/2012  . HTN (hypertension) 01/06/2012  . H/O 01/06/2012    Orientation RESPIRATION BLADDER Height &  Weight     Self  Normal Incontinent, External catheter Weight: 67.3 kg (148 lb 5.9 oz) Height:  5\' 2"  (157.5 cm)  BEHAVIORAL SYMPTOMS/MOOD NEUROLOGICAL BOWEL NUTRITION STATUS      Continent Diet(Please see DC Summary)  AMBULATORY STATUS COMMUNICATION OF NEEDS Skin   Extensive Assist Verbally PU Stage and Appropriate Care(Stage III on buttocks; unstageable on heel)                       Personal Care Assistance Level of Assistance  Bathing, Feeding, Dressing Bathing Assistance: Maximum assistance Feeding assistance: Limited assistance Dressing Assistance: Limited assistance     Functional Limitations Info             SPECIAL CARE FACTORS FREQUENCY  PT (By licensed PT), OT (By licensed OT)     PT Frequency: 5x/week OT Frequency: 3x/week            Contractures      Additional Factors Info  Code Status, Allergies, Psychotropic, Insulin Sliding Scale Code Status Info: DNR Allergies Info: Lipitor Atorvastatin Psychotropic Info: Depakote  Insulin Sliding Scale Info: Every 4 hours       Current Medications (10/28/2017):  This is the current hospital active medication list Current Facility-Administered Medications  Medication Dose Route Frequency Provider Last Rate Last Dose  . azithromycin (ZITHROMAX) 500 mg in sodium chloride 0.9 % 250 mL IVPB  500 mg Intravenous Q24H Sampson Si, [facility]   Stopped at 10/27/17 2038  . cefTRIAXone (ROCEPHIN) 1 g in sodium chloride 0.9 % 100 mL IVPB  1 g Intravenous Q24H Mancheril, Candis Schatz,  RPH   Stopped at 10/27/17 1807  . clopidogrel (PLAVIX) tablet 75 mg  75 mg Oral Q breakfast Pearson GrippeKim, James, MD   75 mg at 10/27/17 0851  . divalproex (DEPAKOTE ER) 24 hr tablet 500 mg  500 mg Oral QHS Pearson GrippeKim, James, MD   500 mg at 10/27/17 2240  . enoxaparin (LOVENOX) injection 30 mg  30 mg Subcutaneous Q24H Pearson GrippeKim, James, MD   30 mg at 10/27/17 2056  . ferrous sulfate tablet 325 mg  325 mg Oral Q breakfast Pearson GrippeKim, James, MD   325 mg at 10/27/17 0851   . guaiFENesin (MUCINEX) 12 hr tablet 600 mg  600 mg Oral BID PRN Pearson GrippeKim, James, MD      . insulin aspart (novoLOG) injection 0-9 Units  0-9 Units Subcutaneous Q4H Pearson GrippeKim, James, MD   1 Units at 10/28/17 0505  . Melatonin TABS 6 mg  6 mg Oral QHS PRN Pearson GrippeKim, James, MD      . memantine (NAMENDA XR) 24 hr capsule 14 mg  14 mg Oral Daily Pearson GrippeKim, James, MD   14 mg at 10/28/17 0029  . protein supplement (PREMIER PROTEIN) liquid  2 oz Oral QID PRN Pearson GrippeKim, James, MD      . rosuvastatin (CRESTOR) tablet 5 mg  5 mg Oral Q M,W,F Pearson GrippeKim, James, MD   5 mg at 10/26/17 2241  . sodium chloride tablet 1 g  1 g Oral Daily Pearson GrippeKim, James, MD   1 g at 10/27/17 0851  . vitamin C (ASCORBIC ACID) tablet 500 mg  500 mg Oral Daily Pearson GrippeKim, James, MD   500 mg at 10/27/17 0851  . zinc oxide (BALMEX) 11.3 % cream 1 application  1 application Topical Daily Pearson GrippeKim, James, MD   1 application at 10/27/17 [phone]     Discharge Medications: Please see discharge summary for a list of discharge medications.  Relevant Imaging Results:  Relevant Lab Results:   Additional Information SS#[id number]  Mearl Latinadia S Fraidy Mccarrick, LCSWA

## 2017-10-28 NOTE — Evaluation (Signed)
Occupational Therapy Evaluation Patient Details Name: Megan [name] MRN: [id number] DOB: 1921-09-04 Today's Date: 10/28/2017    History of Present Illness Pt is a 84 y/o female admitted secondary to AMS. Imaging revealed R upper lobe pneumonia. Pt also found to have UTI. CT of the head negative for acute abnormality. PMH includes CVA with R sided deficits, dementia, seizures, DM, and sacral pressure ulcer.    Clinical Impression   Pt with decline in function and safety with ADLs and ADL mobility with decreased strength, balance, endurance, cognition and hx of CVA with R side deficits. Pt not following any commands and  PTA, pt's daughter states that pt could assist with her UB ADLs min A and could groom and feed herself with set up/ Pt currently requires total A for ADLs and total A +2 for mobility. Educated pt's daughter on the extensive amount of assist that her mother requires and that much more assist at home will be needed.    Follow Up Recommendations  SNF;Supervision/Assistance - 24 hour (pt's daughter refusing SNF and wants to take her home)   Equipment Recommendations  None recommended by OT    Recommendations for Other Services       Precautions / Restrictions Precautions Precautions: Fall Restrictions Weight Bearing Restrictions: No      Mobility Bed Mobility Overal bed mobility: Needs Assistance Bed Mobility: Supine to Sit;Sit to Supine     Supine to sit: Total assist;+2 for physical assistance     General bed mobility comments: Total assist to come up to sitting. Pt not assisting with transfer to come up to sitting.   Transfers Overall transfer level: Needs assistance Equipment used: None Transfers: Sit to/from Stand Sit to Stand: Total assist;+2 physical assistance   Squat pivot transfers: Total assist;+2 physical assistance;+2 safety/equipment;From elevated surface     General transfer comment: pt did not assist with scooting to EOB or sit - stand for squat  pivot transfer    Balance Overall balance assessment: Needs assistance Sitting-balance support: No upper extremity supported;Feet supported Sitting balance-Leahy Scale: Fair Sitting balance - Comments: Pt with forward lean in sitting. Min A to min guard for safety. Tactile cues to sit upright.   Standing balance support: No upper extremity supported Standing balance-Leahy Scale: Zero Standing balance comment: Total assist +2 for transfer.                           ADL either performed or assessed with clinical judgement   ADL Overall ADL's : Needs assistance/impaired Eating/Feeding: Total assistance   Grooming: Total assistance   Upper Body Bathing: Total assistance   Lower Body Bathing: Total assistance   Upper Body Dressing : Total assistance   Lower Body Dressing: Total assistance   Toilet Transfer: Total assistance;+2 for physical assistance;+2 for safety/equipment;Squat-pivot Toilet Transfer Details (indicate cue type and reason): simulated to recliner Toileting- Clothing Manipulation and Hygiene: Total assistance       Functional mobility during ADLs: Total assistance;+2 for physical assistance;+2 for safety/equipment General ADL Comments: pt not following any commands. PTA, pt's daughter states that pt could assist with her UB ADLs min A and could groom and feed herself with set up     Vision Patient Visual Report: No change from baseline       Perception     Praxis      Pertinent Vitals/Pain Pain Assessment: No/denies pain Faces Pain Scale: No hurt     Hand Dominance Right  Extremity/Trunk Assessment Upper Extremity Assessment Upper Extremity Assessment: Generalized weakness   Lower Extremity Assessment Lower Extremity Assessment: Defer to PT evaluation   Cervical / Trunk Assessment Cervical / Trunk Assessment: Kyphotic   Communication Communication Communication: Expressive difficulties;HOH   Cognition Arousal/Alertness:  Awake/alert Behavior During Therapy: Flat affect Overall Cognitive Status: History of cognitive impairments - at baseline                                 General Comments: Pt unable to follow commands or respond to any questions.   General Comments  pt's daughter present and still refusing SNF and states that her sister will assist her with their mom and that she will get additional assist. Reiterated to pt's daughter how much assist that pt will need    Exercises     Shoulder Instructions      Home Living Family/patient expects to be discharged to:: Private residence Living Arrangements: Children Available Help at Discharge: Family;Available 24 hours/day Type of Home: House Home Access: Level entry     Home Layout: One level               Home Equipment: Walker - 2 wheels;Bedside commode;Wheelchair - manual          Prior Functioning/Environment Level of Independence: Needs assistance  Gait / Transfers Assistance Needed: Needs help to stand and pivot to WC and to [facility].  ADL's / Homemaking Assistance Needed: Daughter assists with bathing and dressing.             OT Problem List: Decreased strength;Decreased activity tolerance;Decreased cognition;Impaired tone;Impaired UE functional use;Decreased range of motion;Impaired balance (sitting and/or standing);Decreased coordination;Decreased safety awareness      OT Treatment/Interventions: Self-care/ADL training;DME and/or AE instruction;Therapeutic activities;Balance training;Therapeutic exercise;Patient/family education    OT Goals(Current goals can be found in the care plan section) Acute Rehab OT Goals Patient Stated Goal: to go home per daughter  OT Goal Formulation: With patient/family Time For Goal Achievement: 11/11/17 Potential to Achieve Goals: Good ADL Goals Pt Will Perform Grooming: with max assist;with mod assist;sitting Pt Will Perform Upper Body Bathing: with max assist;with mod  assist Pt Will Perform Upper Body Dressing: with max assist;with mod assist Pt Will Transfer to Toilet: with max assist;with mod assist;with +2 assist;bedside commode;squat pivot transfer;stand pivot transfer Additional ADL Goal #1: Pt will complete bed mobility with max A to sit EOB for functional tasks and ADL transfer prep  OT Frequency: Min 2X/week   Barriers to D/C: Decreased caregiver support  pt's daughter refusing SNF although pt is total A with ADLs and total A +2 with mobility       Co-evaluation   Reason for Co-Treatment: Complexity of the patient's impairments (multi-system involvement);Necessary to address cognition/behavior during functional activity;For patient/therapist safety;To address functional/ADL transfers PT goals addressed during session: Mobility/safety with mobility        AM-PAC PT "6 Clicks" Daily Activity     Outcome Measure Help from another person eating meals?: Total Help from another person taking care of personal grooming?: Total Help from another person toileting, which includes using toliet, bedpan, or urinal?: Total Help from another person bathing (including washing, rinsing, drying)?: Total Help from another person to put on and taking off regular upper body clothing?: Total Help from another person to put on and taking off regular lower body clothing?: Total 6 Click Score: 6   End of Session Equipment Utilized During  Treatment: Gait belt  Activity Tolerance: Patient limited by fatigue Patient left: in chair;with call bell/phone within reach;with chair alarm set;with family/visitor present  OT Visit Diagnosis: Unsteadiness on feet (R26.81);Other abnormalities of gait and mobility (R26.89);Muscle weakness (generalized) (M62.81);Hemiplegia and hemiparesis Hemiplegia - Right/Left: Right Hemiplegia - dominant/non-dominant: Dominant                Time: 1610-9604 OT Time Calculation (min): 32 min Charges:  OT Evaluation $OT Eval Moderate  Complexity: 1 Mod G-Codes: OT G-codes **NOT FOR INPATIENT CLASS** Functional Assessment Tool Used: AM-PAC 6 Clicks Daily Activity     Galen Manila 10/28/2017, 12:03 PM

## 2017-10-28 NOTE — Progress Notes (Signed)
CSW received consult regarding PT recommendation of SNF at discharge.  Patient's daughter is refusing SNF at this time and requests home health. RNCM aware.   CSW signing off.   Megan [name] LCSWA [phone]

## 2017-10-28 NOTE — Progress Notes (Signed)
Physical Therapy Treatment Patient Details Name: Megan [name] MRN: [id number] DOB: 01-27-1922 Today's Date: 10/28/2017    History of Present Illness Pt is a 84 y/o female admitted secondary to AMS. Imaging revealed R upper lobe pneumonia. Pt also found to have UTI. CT of the head negative for acute abnormality. PMH includes CVA with R sided deficits, dementia, seizures, DM, and sacral pressure ulcer.     PT Comments    Pt requiring total A +2 for all mobilities this session. Pts daughter present and requesting to take pt home instead of SNF. Spoke at length with daughter about pt's level of assist. Daughter still wishes to take pt home but seemed agreeable to hiring help. At this time pt is not following commands or responding to questions. Feel SNF lever therapies would still be appropriate. Will continue to follow acutely to maximize pt's safety with mobility and functional independence.    Follow Up Recommendations  SNF;Supervision/Assistance - 24 hour     Equipment Recommendations  Other (comment)(hoyer lift; hoyer lift pad)    Recommendations for Other Services       Precautions / Restrictions Precautions Precautions: Fall Restrictions Weight Bearing Restrictions: No    Mobility  Bed Mobility Overal bed mobility: Needs Assistance Bed Mobility: Supine to Sit     Supine to sit: Total assist;+2 for safety/equipment;+2 for physical assistance;HOB elevated     General bed mobility comments: Pt with no initiation of movement and eyes closed initially. Use of bed pads.  Transfers Overall transfer level: Needs assistance Equipment used: None Transfers: Squat Pivot Transfers     Squat pivot transfers: Total assist;+2 physical assistance;+2 safety/equipment;From elevated surface     General transfer comment: Pt required total assist +2 for squat pivot transfer with bed pads under hips.  Ambulation/Gait             General Gait Details: unable   Stairs             Wheelchair Mobility    Modified Rankin (Stroke Patients Only)       Balance Overall balance assessment: Needs assistance Sitting-balance support: No upper extremity supported;Feet supported Sitting balance-Leahy Scale: Fair Sitting balance - Comments: Pt with forward lean in sitting. Min A to min guard for safety. Tactile cues to sit upright.   Standing balance support: No upper extremity supported Standing balance-Leahy Scale: Zero Standing balance comment: Total assist for transfer.                            Cognition Arousal/Alertness: Awake/alert Behavior During Therapy: Flat affect Overall Cognitive Status: History of cognitive impairments - at baseline                                 General Comments: Pt unable to follow commands or respond to any questions.      Exercises      General Comments General comments (skin integrity, edema, etc.): Pt daugter present and wishes to take mother home. Spoke at length with daughter about how much help her mother will need.      Pertinent Vitals/Pain Pain Assessment: No/denies pain Faces Pain Scale: No hurt    Home Living Family/patient expects to be discharged to:: Private residence Living Arrangements: Children Available Help at Discharge: Family;Available 24 hours/day Type of Home: House Home Access: Level entry   Home Layout: One level Home Equipment: Dan HumphreysWalker -  2 wheels;Bedside commode;Wheelchair - manual      Prior Function Level of Independence: Needs assistance  Gait / Transfers Assistance Needed: Needs help to stand and pivot to WC and to [facility].  ADL's / Homemaking Assistance Needed: Daughter assists with bathing and dressing.      PT Goals (current goals can now be found in the care plan section) Acute Rehab PT Goals Patient Stated Goal: to go home per daughter  PT Goal Formulation: With family Time For Goal Achievement: 11/10/17 Potential to Achieve Goals: Fair Progress  towards PT goals: Not progressing toward goals - comment(unable to follow commands at this time)    Frequency    Min 3X/week      PT Plan Current plan remains appropriate    Co-evaluation PT/OT/SLP Co-Evaluation/Treatment: Yes Reason for Co-Treatment: Complexity of the patient's impairments (multi-system involvement);Necessary to address cognition/behavior during functional activity;For patient/therapist safety;To address functional/ADL transfers PT goals addressed during session: Mobility/safety with mobility        AM-PAC PT "6 Clicks" Daily Activity  Outcome Measure  Difficulty turning over in bed (including adjusting bedclothes, sheets and blankets)?: Unable Difficulty moving from lying on back to sitting on the side of the bed? : Unable Difficulty sitting down on and standing up from a chair with arms (e.g., wheelchair, bedside commode, etc,.)?: Unable Help needed moving to and from a bed to chair (including a wheelchair)?: Total Help needed walking in hospital room?: Total Help needed climbing 3-5 steps with a railing? : Total 6 Click Score: 6    End of Session Equipment Utilized During Treatment: Gait belt Activity Tolerance: Patient tolerated treatment well Patient left: with call bell/phone within reach;with family/visitor present;in chair;with chair alarm set Nurse Communication: Mobility status;Need for lift equipment PT Visit Diagnosis: Unsteadiness on feet (R26.81);Other abnormalities of gait and mobility (R26.89);Muscle weakness (generalized) (M62.81)     Time: 1610-9604 PT Time Calculation (min) (ACUTE ONLY): 31 min  Charges:  $Therapeutic Activity: 8-22 mins                    G Codes:       Kallie Locks, [place] Pager [phone] Acute Rehab   Sheral Apley 10/28/2017, 11:54 AM

## 2017-10-28 NOTE — Care Management Note (Addendum)
Case Management Note  Patient Details  Name: Megan [name] MRN: [id number] Date of Birth: [birth year]  Subjective/Objective:    CAP           Chanda BusingGail Patricia Blondin (Daughter) Roney Marionhelma Williams (Daughter)    [phone]      PCP: Kirby FunkJohn Griffin  Action/Plan: Transition to home today with the resumption of home health services, declines SNF placement. Pt will need PTAR services called to arrange transportation to home.  Expected Discharge Date:  10/28/17               Expected Discharge Plan:  Home w Home Health Services  In-House Referral:     Discharge planning Services  CM Consult  Post Acute Care Choice:  Resumption of Svcs/PTA Provider Choice offered to:  Adult Children  DME Arranged:  (hoyer lift and lift pad) DME Agency:  [phone].  HH Arranged:  PT, RN, OT HH Agency:  [facility]  Status of Service:  Completed, signed off  If discussed at Long Length of Stay Meetings, dates discussed:    Additional Comments:  Epifanio LeschesCole, Aryannah Mohon Hudson, RN 10/28/2017, 2:37 PM

## 2017-10-28 NOTE — Discharge Instructions (Signed)

## 2017-10-28 NOTE — Progress Notes (Signed)
  Speech Language Pathology Treatment: Dysphagia  Patient Details Name: Megan [name] MRN: [id number] DOB: 07-21-1922 Today's Date: 10/28/2017 Time: 1211-1224 SLP Time Calculation (min) (ACUTE ONLY): 13 min  Assessment / Plan / Recommendation Clinical Impression  Pt seen with PO trials consistent with current diet recommendations, with one significant cough response following two consecutive sips of thin liquid from a straw. No other s/s concerning for aspiration this session with further single, slow straw sips and single cup sips with Mod verbal cues from SLP. Oral phase deficits were evident during puree trials, with pt benefiting from liquid wash to clear oral cavity and monitoring for anterior loss on weak right side. Given limited s/s concerning for aspiration, recommend continued diet recommendations of Dys 1 and thin liquids with aspiration precautions (monitoring for anterior loss, oral holding and pocketing & slow, small, controled sips of thin liquid). SLP will follow briefly to ensure continued safety with diet and aspiration precautions.    HPI HPI: Megan [name] an 84 y.o. femalewho [facility]with a chief complaint of AMS. Presents with CAP (RUL) and UTI.  H/o CVA with residual right side deficits, dementia, GERD. Swallowing evaluated in 2013 by this SLP and documentation reveals a mild oropharyngeal dysphagia with seemingly good airway protection, recommended a regular diet, thin liquid. Evaluated again in 2014 with recommendations for pureed solids, thin liquid.       SLP Plan  Continue with current plan of care       Recommendations  Diet recommendations: Thin liquid;Dysphagia 1 (puree) Liquids provided via: Cup;Straw Medication Administration: Crushed with puree Supervision: Staff to assist with self feeding;Full supervision/cueing for compensatory strategies Compensations: Slow rate;Small sips/bites;Monitor for anterior loss Postural Changes and/or  Swallow Maneuvers: Seated upright 90 degrees;Upright 30-60 min after meal                Oral Care Recommendations: Oral care BID Follow up Recommendations: None SLP Visit Diagnosis: Dysphagia, oropharyngeal phase (R13.12) Plan: Continue with current plan of care       GO              [place] Justice Milliron SLP Student Clinician'   [place] Yuval Rubens 10/28/2017, 1:24 PM

## 2017-10-28 NOTE — Discharge Summary (Addendum)
Physician Discharge Summary  [name] DGU:[id number] DOB: Jun 19, 1922 DOA: 10/26/2017  PCP: Lavone Orn, MD  Admit date: 10/26/2017 Discharge date: 10/29/2017  Admitted From: Home Disposition: Home  Recommendations for Outpatient Follow-up:  1. Follow up with PCP in 1 week 2. Please obtain BMP/CBC in one week 3. Please follow up on the following pending results: Blood culture  Home Health: RN, PT, OT, SLP Equipment/Devices: Harrel Lemon lift/lift pad  Discharge Condition: Stable CODE STATUS: DNR Diet recommendation: Dysphgia 1 diet   Brief/Interim Summary:  Admission HPI written by Jani Gravel, MD   HPI:   Megan [name]  is a 84 y.o. female, w Hyperlipidemia, Dm2, CVA, seizure do, hyponatremia, apparently presents with 3 days of generalized weakness and poor po intake and slight decrease in responsiveness. Pt has memory issues and is unable to provide history.  Slight dry cough x3 days, no dysuria noted but decreased in urine output and ? Slight hypothermia. Her daughter noted 1 episode of n/v (no blood) last nite.   Her daughter is present to provide above history and confirms DNR.    In ED, pt was initially hypotensive.   CT brain IMPRESSION: 1. No acute intracranial abnormality. 2. Atrophy and chronic microvascular white matter ischemic changes. 3. Remote basal ganglia lacunar infarcts. 4. Right mastoid effusion.  CXR  IMPRESSION: RIGHT upper lobe pneumonia.  Chronic bibasilar opacities favoring fibrosis and atelectasis.  Na 133, K 4.7, Bun 21, Creatinine 1.04 Ast 23, Alt 14 Alb 2.5  Wbc 14.2, Hgb 11.3 Plt 283 Depakote 41 Lactic acid 2.13  Urinalysis Wbc tntc, rbc 6-30  Pt will be admitted for sepsis, secondary to UTI, RUL pneumonia.    Hospital course:  Right upper lobe pneumonia Patient treated with ceftriaxone and azithromycin. No oxygen requirement with improvement of symptoms. Question of aspiration, but clinically, patient responded to current  treatment. Continue for 7 day course of cephalosporin and 5 day course of azithromycin.  Positive blood culture One of two bottles significant for coagulase negative staphylococcus. Second blood culture no growth to date. If second blood culture becomes positive, patient would need to be treated. Follow-up blood culture result as an outpatient.  Urinary tract infection Symptoms of altered mental status. Urinalysis suggests infection. Urine culture not obtained. Continue treatment with Vantin on discharge.  Acute metabolic encephalopathy Secondary to above infections. Resolved patient at baseline.  Hyponatremia Resolved with IV fluids. Continued sodium chloride tablets  Seizure disorder Stable. Continued Depakote  GERD Patient takes Protonix as an outpatient. Increases risk for pneumonia from aspiration.  Diabetes mellitus, type 2 Continued [place]. Sliding scale insulin  History of CVA Residual right sided weakness and dysarthria. Continued Crestor and Plavix  Dementia Continued Namenda  Cough Present especially with eating. Concern for dysphagia possibly causing aspiration. Speech therapy recommending dysphagia 1 diet  Multiple pressure injuries Stage II left buttock, stage III right buttock, unstageable left heel> Recommend outpatient wound care, frequent turns, etc.  Sepsis Met criteria on admission. Resolved with antibiotics.    Discharge Diagnoses:  Principal Problem:   Right upper lobe pneumonia ([place]) Active Problems:   History of CVA w/residual right side deficits   Dementia with Lewy bodies   Altered mental state   Hyponatremia   Pressure injury of skin   UTI (urinary tract infection)    Discharge Instructions  Discharge Instructions    Call MD for:  difficulty breathing, headache or visual disturbances   Complete by:  As directed    Call MD for:  temperature >100.4  Complete by:  As directed    Diet - low sodium heart healthy   Complete by:   As directed    Increase activity slowly   Complete by:  As directed      Allergies as of 10/29/2017      Reactions   Lipitor [atorvastatin] Other (See Comments)   Myalgias       Medication List    STOP taking these medications   pantoprazole 40 MG tablet Commonly known as:  PROTONIX     TAKE these medications   azithromycin 250 MG tablet Commonly known as:  ZITHROMAX Take 1 tablet (250 mg total) by mouth daily for 1 day. Start taking on:  10/30/2017   cefpodoxime 200 MG tablet Commonly known as:  VANTIN Take 1 tablet (200 mg total) by mouth 2 (two) times daily for 3 days.   clopidogrel 75 MG tablet Commonly known as:  PLAVIX Take 1 tablet (75 mg total) by mouth daily with breakfast.   divalproex 500 MG 24 hr tablet Commonly known as:  DEPAKOTE ER Take 1 tablet (500 mg total) by mouth at bedtime.   famotidine 20 MG tablet Commonly known as:  PEPCID Take 1 tablet (20 mg total) by mouth daily.   feeding supplement (PRO-STAT SUGAR FREE 64) Liqd Take 30 mLs by mouth 4 (four) times daily -  with meals and at bedtime.   ferrous sulfate 325 (65 FE) MG tablet Take 325 mg by mouth daily with breakfast.   guaiFENesin 600 MG 12 hr tablet Commonly known as:  MUCINEX Take 600 mg by mouth 2 (two) times daily as needed for to loosen phlegm.   Melatonin 5 MG Tabs Take 5 mg by mouth at bedtime as needed (sleep).   NAMENDA XR 14 MG Cp24 24 hr capsule Generic drug:  memantine Take 14 mg by mouth daily.   nystatin powder Generic drug:  nystatin Apply 1 application topically 2 (two) times daily as needed for rash.   polyethylene glycol packet Commonly known as:  MIRALAX Take 17 g by mouth daily.   protein supplement shake Liqd Commonly known as:  PREMIER PROTEIN Take 59.1 mLs (2 oz total) by mouth 4 (four) times daily -  with meals and at bedtime. What changed:    when to take this  reasons to take this   rosuvastatin 5 MG tablet Commonly known as:  CRESTOR Take 5  mg by mouth every Monday, Wednesday, and Friday.   sodium chloride 1 g tablet Take 1 g by mouth daily.   TOUJEO SOLOSTAR 300 UNIT/ML Sopn Generic drug:  Insulin Glargine Inject 14 Units as directed every evening. What changed:  how much to take   vitamin C 500 MG tablet Commonly known as:  ASCORBIC ACID Take 500 mg by mouth daily.   zinc oxide 11.3 % Crea cream Commonly known as:  BALMEX Apply 1 application topically daily.            Durable Medical Equipment  (From admission, onward)        Start     Ordered   10/28/17 1209  For home use only DME Other see comment  Once    Comments:  Hoyer lift and hoyer lift pad   10/28/17 1209     Follow-up Information    Lavone Orn, MD. Schedule an appointment as soon as possible for a visit in 1 week(s).   Specialty:  Internal Medicine Contact information: [address], [address]  [place]             . Schedule an appointment as soon as possible for a visit.   Contact information: [address] [phone]         Allergies  Allergen Reactions  . Lipitor [Atorvastatin] Other (See Comments)    Myalgias     Consultations:  None   Procedures/Studies: Dg Chest 2 View  Result Date: 10/26/2017 CLINICAL DATA:  Altered mental status, hypotension, fatigue, diabetes mellitus, history of stroke and seizures EXAM: CHEST - 2 VIEW COMPARISON:  06/18/2017 FINDINGS: Minimal enlargement of cardiac silhouette. Atherosclerotic calcification aorta. RIGHT upper lobe airspace opacity consistent with pneumonia. BILATERAL lower lobe opacities which may represent atelectasis or known fibrosis though mild superimposed acute infiltrates are not completely excluded. Upper LEFT lung clear. No pleural effusion or pneumothorax. No acute osseous findings. IMPRESSION: RIGHT upper lobe pneumonia. Chronic  bibasilar opacities favoring fibrosis and atelectasis. Electronically Signed   By: Lavonia Dana M.D.   On: 10/26/2017 15:54   Ct Head Wo Contrast  Result Date: 10/26/2017 CLINICAL DATA:  Altered mental status, newly nonverbal today. EXAM: CT HEAD WITHOUT CONTRAST TECHNIQUE: Contiguous axial images were obtained from the base of the skull through the vertex without intravenous contrast. COMPARISON:  06/18/2017. FINDINGS: Brain: No definite evidence of an acute infarct, acute hemorrhage, mass lesion, mass effect or hydrocephalus. Atrophy. Periventricular low attenuation. Probable remote infarcts in the basal ganglia bilaterally. Vascular: No hyperdense vessel or unexpected calcification. Skull: Normal. Negative for fracture or focal lesion. Sinuses/Orbits: No acute finding. Other: Right mastoid effusion. IMPRESSION: 1. No acute intracranial abnormality. 2. Atrophy and chronic microvascular white matter ischemic changes. 3. Remote basal ganglia lacunar infarcts. 4. Right mastoid effusion. Electronically Signed   By: Lorin Picket M.D.   On: 10/26/2017 13:16     Subjective: Back to baseline cognitively. Didn't eat much overnight.   Discharge Exam: Vitals:   10/27/17 2139 10/28/17 0423  BP: (!) 141/51 (!) 144/74  Pulse: (!) 107 95  Resp: 18 18  Temp: 98.1 F (36.7 C) 98.3 F (36.8 C)  SpO2: 98% 97%   Vitals:   10/27/17 0415 10/27/17 1425 10/27/17 2139 10/28/17 0423  BP: (!) 138/41 (!) 128/53 (!) 141/51 (!) 144/74  Pulse: 81 89 (!) 107 95  Resp: _0 Temp: 98.1 F (36.7 C) (!) 97.3 F (36.3 C) 98.1 F (36.7 C) 98.3 F (36.8 C)  TempSrc: Tympanic Oral Oral Oral  SpO2: 100% 98% 98% 97%  Weight:      Height:        General: Pt is alert, awake, not in acute distress Cardiovascular: RRR, S1/S2 +, no rubs, no gallops Respiratory: Diminished breath sounds Abdominal: Soft, NT, ND, bowel sounds + Extremities: no edema, no cyanosis    The results of significant diagnostics from  this hospitalization (including imaging, microbiology, ancillary and laboratory) are listed below for reference.     Microbiology: Recent Results (from the past 240 hour(s))  Culture, blood (routine x 2)     Status: None (Preliminary result)   Collection Time: 10/26/17  1:55 PM  Result Value Ref Range Status   Specimen Description BLOOD LEFT ANTECUBITAL  Final   Special Requests   Final    BOTTLES DRAWN AEROBIC AND ANAEROBIC Blood Culture adequate volume   Culture   Final    NO GROWTH <  24 HOURS Performed at [facility], [place] [address], [place], [place] [address]    Report Status PENDING  Incomplete  Culture, blood (routine x 2)     Status: Abnormal (Preliminary result)   Collection Time: 10/26/17  2:37 PM  Result Value Ref Range Status   Specimen Description BLOOD RIGHT HAND  Final   Special Requests IN PEDIATRIC BOTTLE Blood Culture adequate volume  Final   Culture  Setup Time   Final    GRAM POSITIVE COCCI IN PEDIATRIC BOTTLE CRITICAL RESULT CALLED TO, READ BACK BY AND VERIFIED WITH: Ferne Coe PharmD 12:05 10/27/17 ([email or web address]) Performed at [facility], [place] [address] [place], [place] [address]    Culture STAPHYLOCOCCUS SPECIES (COAGULASE NEGATIVE) (A)  Final   Report Status PENDING  Incomplete  Blood Culture ID Panel (Reflexed)     Status: Abnormal   Collection Time: 10/26/17  2:37 PM  Result Value Ref Range Status   Enterococcus species NOT DETECTED NOT DETECTED Final   Listeria monocytogenes NOT DETECTED NOT DETECTED Final   Staphylococcus species DETECTED (A) NOT DETECTED Final    Comment: Methicillin (oxacillin) susceptible coagulase negative staphylococcus. Possible blood culture contaminant (unless isolated from more than one blood culture draw or clinical case suggests pathogenicity). No antibiotic treatment is indicated for blood  culture contaminants. CRITICAL RESULT CALLED TO, READ BACK BY AND VERIFIED WITH: Ferne Coe PharmD 12:05 10/27/17 ([email or web address])     Staphylococcus aureus NOT DETECTED NOT DETECTED Final   Methicillin resistance NOT DETECTED NOT DETECTED Final   Streptococcus species NOT DETECTED NOT DETECTED Final   Streptococcus agalactiae NOT DETECTED NOT DETECTED Final   Streptococcus pneumoniae NOT DETECTED NOT DETECTED Final   Streptococcus pyogenes NOT DETECTED NOT DETECTED Final   Acinetobacter baumannii NOT DETECTED NOT DETECTED Final   Enterobacteriaceae species NOT DETECTED NOT DETECTED Final   Enterobacter cloacae complex NOT DETECTED NOT DETECTED Final   Escherichia coli NOT DETECTED NOT DETECTED Final   Klebsiella oxytoca NOT DETECTED NOT DETECTED Final   Klebsiella pneumoniae NOT DETECTED NOT DETECTED Final   Proteus species NOT DETECTED NOT DETECTED Final   Serratia marcescens NOT DETECTED NOT DETECTED Final   Haemophilus influenzae NOT DETECTED NOT DETECTED Final   Neisseria meningitidis NOT DETECTED NOT DETECTED Final   Pseudomonas aeruginosa NOT DETECTED NOT DETECTED Final   Candida albicans NOT DETECTED NOT DETECTED Final   Candida glabrata NOT DETECTED NOT DETECTED Final   Candida krusei NOT DETECTED NOT DETECTED Final   Candida parapsilosis NOT DETECTED NOT DETECTED Final   Candida tropicalis NOT DETECTED NOT DETECTED Final     Labs: BNP (last 3 results) No results for input(s): BNP in the last 8760 hours. Basic Metabolic Panel: Recent Labs  Lab 10/26/17 1213 10/27/17 0611  NA 133* 135  K 4.7 4.4  CL 99* 105  CO2 24 19*  GLUCOSE 165* 95  BUN 21* 13  CREATININE 1.04* 0.83  CALCIUM 9.9 9.2   Liver Function Tests: Recent Labs  Lab 10/26/17 1213 10/27/17 0611  AST 23 20  ALT 14 10*  ALKPHOS 119 93  BILITOT 0.4 0.4  PROT 7.6 6.9  ALBUMIN 2.5* 2.1*   No results for input(s): LIPASE, AMYLASE in the last 168 hours. No results for input(s): AMMONIA in the last 168 hours. CBC: Recent Labs  Lab 10/26/17 1213 10/27/17 0611  WBC 14.2* 10.5  HGB 11.3* 11.5*  HCT 35.6* 36.3  MCV 88.1 87.1  PLT  283 176  Cardiac Enzymes: No results for input(s): CKTOTAL, CKMB, CKMBINDEX, TROPONINI in the last 168 hours. BNP: Invalid input(s): POCBNP CBG: Recent Labs  Lab 10/27/17 2006 10/28/17 0036 10/28/17 0420 10/28/17 0755 10/28/17 1155  GLUCAP 165* 180* 135* 105* 140*   D-Dimer No results for input(s): DDIMER in the last 72 hours. Hgb A1c No results for input(s): HGBA1C in the last 72 hours. Lipid Profile No results for input(s): CHOL, HDL, LDLCALC, TRIG, CHOLHDL, LDLDIRECT in the last 72 hours. Thyroid function studies No results for input(s): TSH, T4TOTAL, T3FREE, THYROIDAB in the last 72 hours.  Invalid input(s): FREET3 Anemia work up No results for input(s): VITAMINB12, FOLATE, FERRITIN, TIBC, IRON, RETICCTPCT in the last 72 hours. Urinalysis    Component Value Date/Time   COLORURINE AMBER (A) 10/26/2017 1930   APPEARANCEUR CLOUDY (A) 10/26/2017 1930   LABSPEC 1.013 10/26/2017 1930   PHURINE 7.0 10/26/2017 1930   GLUCOSEU 50 (A) 10/26/2017 1930   HGBUR MODERATE (A) 10/26/2017 1930   BILIRUBINUR NEGATIVE 10/26/2017 1930   KETONESUR 5 (A) 10/26/2017 1930   PROTEINUR 30 (A) 10/26/2017 1930   UROBILINOGEN 0.2 10/23/2014 0830   NITRITE NEGATIVE 10/26/2017 1930   LEUKOCYTESUR LARGE (A) 10/26/2017 1930   Sepsis Labs Invalid input(s): PROCALCITONIN,  WBC,  LACTICIDVEN Microbiology Recent Results (from the past 240 hour(s))  Culture, blood (routine x 2)     Status: None (Preliminary result)   Collection Time: 10/26/17  1:55 PM  Result Value Ref Range Status   Specimen Description BLOOD LEFT ANTECUBITAL  Final   Special Requests   Final    BOTTLES DRAWN AEROBIC AND ANAEROBIC Blood Culture adequate volume   Culture   Final    NO GROWTH < 24 HOURS Performed at [facility], [place] [address], [place], [place] [address]    Report Status PENDING  Incomplete  Culture, blood (routine x 2)     Status: Abnormal (Preliminary result)   Collection Time: 10/26/17  2:37 PM   Result Value Ref Range Status   Specimen Description BLOOD RIGHT HAND  Final   Special Requests IN PEDIATRIC BOTTLE Blood Culture adequate volume  Final   Culture  Setup Time   Final    GRAM POSITIVE COCCI IN PEDIATRIC BOTTLE CRITICAL RESULT CALLED TO, READ BACK BY AND VERIFIED WITH: Ferne Coe PharmD 12:05 10/27/17 ([email or web address]) Performed at [facility], [place] [address] [place], [place] [address]    Culture STAPHYLOCOCCUS SPECIES (COAGULASE NEGATIVE) (A)  Final   Report Status PENDING  Incomplete  Blood Culture ID Panel (Reflexed)     Status: Abnormal   Collection Time: 10/26/17  2:37 PM  Result Value Ref Range Status   Enterococcus species NOT DETECTED NOT DETECTED Final   Listeria monocytogenes NOT DETECTED NOT DETECTED Final   Staphylococcus species DETECTED (A) NOT DETECTED Final    Comment: Methicillin (oxacillin) susceptible coagulase negative staphylococcus. Possible blood culture contaminant (unless isolated from more than one blood culture draw or clinical case suggests pathogenicity). No antibiotic treatment is indicated for blood  culture contaminants. CRITICAL RESULT CALLED TO, READ BACK BY AND VERIFIED WITH: Ferne Coe PharmD 12:05 10/27/17 ([email or web address])    Staphylococcus aureus NOT DETECTED NOT DETECTED Final   Methicillin resistance NOT DETECTED NOT DETECTED Final   Streptococcus species NOT DETECTED NOT DETECTED Final   Streptococcus agalactiae NOT DETECTED NOT DETECTED Final   Streptococcus pneumoniae NOT DETECTED NOT DETECTED Final   Streptococcus pyogenes NOT DETECTED NOT DETECTED Final   Acinetobacter baumannii NOT  DETECTED NOT DETECTED Final   Enterobacteriaceae species NOT DETECTED NOT DETECTED Final   Enterobacter cloacae complex NOT DETECTED NOT DETECTED Final   Escherichia coli NOT DETECTED NOT DETECTED Final   Klebsiella oxytoca NOT DETECTED NOT DETECTED Final   Klebsiella pneumoniae NOT DETECTED NOT DETECTED Final   Proteus species NOT DETECTED NOT DETECTED  Final   Serratia marcescens NOT DETECTED NOT DETECTED Final   Haemophilus influenzae NOT DETECTED NOT DETECTED Final   Neisseria meningitidis NOT DETECTED NOT DETECTED Final   Pseudomonas aeruginosa NOT DETECTED NOT DETECTED Final   Candida albicans NOT DETECTED NOT DETECTED Final   Candida glabrata NOT DETECTED NOT DETECTED Final   Candida krusei NOT DETECTED NOT DETECTED Final   Candida parapsilosis NOT DETECTED NOT DETECTED Final   Candida tropicalis NOT DETECTED NOT DETECTED Final     Time coordinating discharge: Over 30 minutes  SIGNED:   Cordelia Poche, MD [facility] 10/28/2017, 2:05 PM Pager [phone]  If 7PM-7AM, please contact night-coverage [email or web address] Password [email or web address]

## 2017-10-29 LAB — GLUCOSE, CAPILLARY
GLUCOSE-CAPILLARY: 90 mg/dL (ref 65–99)
Glucose-Capillary: 257 mg/dL — ABNORMAL HIGH (ref 65–99)
Glucose-Capillary: 155 mg/dL — ABNORMAL HIGH (ref 65–99)

## 2017-10-29 LAB — CULTURE, BLOOD (ROUTINE X 2): Special Requests: ADEQUATE

## 2017-10-29 MED ORDER — CEFPODOXIME PROXETIL 200 MG PO TABS: 200.0000 mg | ORAL_TABLET | Freq: Two times a day (BID) | ORAL | 0 refills | Status: DC

## 2017-10-29 MED ORDER — AZITHROMYCIN 250 MG PO TABS: 250.0000 mg | ORAL_TABLET | Freq: Every day | ORAL | 0 refills | Status: AC

## 2017-10-29 MED ORDER — BISACODYL 10 MG RE SUPP
10.0000 mg | Freq: Once | RECTAL | Status: AC
  Administered 2017-10-29: 10 mg via RECTAL
  Filled 2017-10-29: qty 1

## 2017-10-29 NOTE — Progress Notes (Signed)
Called [facility] requested 11:00 transport to home.

## 2017-10-29 NOTE — Progress Notes (Signed)
Nsg Discharge Note  Admit Date:  10/26/2017 Discharge date: 10/29/2017   [name] to be D/C'd Home per MD order.  AVS completed.  Copy for chart, and copy for patient signed, and dated. Patient/caregiver able to verbalize understanding.  Discharge Medication: Allergies as of 10/29/2017      Reactions   Lipitor [atorvastatin] Other (See Comments)   Myalgias       Medication List    STOP taking these medications   pantoprazole 40 MG tablet Commonly known as:  PROTONIX     TAKE these medications   azithromycin 250 MG tablet Commonly known as:  ZITHROMAX Take 1 tablet (250 mg total) by mouth daily for 1 day. Start taking on:  10/30/2017   cefpodoxime 200 MG tablet Commonly known as:  VANTIN Take 1 tablet (200 mg total) by mouth 2 (two) times daily for 3 days.   clopidogrel 75 MG tablet Commonly known as:  PLAVIX Take 1 tablet (75 mg total) by mouth daily with breakfast.   divalproex 500 MG 24 hr tablet Commonly known as:  DEPAKOTE ER Take 1 tablet (500 mg total) by mouth at bedtime.   famotidine 20 MG tablet Commonly known as:  PEPCID Take 1 tablet (20 mg total) by mouth daily.   feeding supplement (PRO-STAT SUGAR FREE 64) Liqd Take 30 mLs by mouth 4 (four) times daily -  with meals and at bedtime.   ferrous sulfate 325 (65 FE) MG tablet Take 325 mg by mouth daily with breakfast.   guaiFENesin 600 MG 12 hr tablet Commonly known as:  MUCINEX Take 600 mg by mouth 2 (two) times daily as needed for to loosen phlegm.   Melatonin 5 MG Tabs Take 5 mg by mouth at bedtime as needed (sleep).   NAMENDA XR 14 MG Cp24 24 hr capsule Generic drug:  memantine Take 14 mg by mouth daily.   nystatin powder Generic drug:  nystatin Apply 1 application topically 2 (two) times daily as needed for rash.   polyethylene glycol packet Commonly known as:  MIRALAX Take 17 g by mouth daily.   protein supplement shake Liqd Commonly known as:  PREMIER PROTEIN Take 59.1 mLs (2 oz total)  by mouth 4 (four) times daily -  with meals and at bedtime. What changed:    when to take this  reasons to take this   rosuvastatin 5 MG tablet Commonly known as:  CRESTOR Take 5 mg by mouth every Monday, Wednesday, and Friday.   sodium chloride 1 g tablet Take 1 g by mouth daily.   TOUJEO SOLOSTAR 300 UNIT/ML Sopn Generic drug:  Insulin Glargine Inject 14 Units as directed every evening. What changed:  how much to take   vitamin C 500 MG tablet Commonly known as:  ASCORBIC ACID Take 500 mg by mouth daily.   zinc oxide 11.3 % Crea cream Commonly known as:  BALMEX Apply 1 application topically daily.       Discharge Assessment: Vitals:   10/28/17 2022 10/29/17 0541  BP: (!) 120/50 (!) 133/54  Pulse: 67 91  Resp: 16 18  Temp: 98.6 F (37 C) 99.4 F (37.4 C)  SpO2: 94% 90%   Patient has wound on bottom and heel. IV catheter discontinued intact. Site without signs and symptoms of complications - no redness or edema noted at insertion site, patient denies c/o pain - only slight tenderness at site.  Dressing with slight pressure applied.  D/c Instructions-Education: Discharge instructions given to patient/family with verbalized  understanding. D/c education completed with patient/family including follow up instructions, medication list, d/c activities limitations if indicated, with other d/c instructions as indicated by MD - patient able to verbalize understanding, all questions fully answered. Patient instructed to return to ED, call 911, or call MD for any changes in condition.  Patient escorted via [facility] to her home with daughter.  Meaghann Choo Consuella Loselaine, RN 10/29/2017 5:32 PM

## 2017-10-29 NOTE — Progress Notes (Signed)
Patient discharged 10/28/17. No safe discharge until today. Patient still stable for discharge. Given dulcolax suppository for constipation.  Jacquelin Hawkingalph Nettey, MD [facility] 10/29/2017, 10:31 AM Pager: [phone]

## 2017-10-31 ENCOUNTER — Encounter (HOSPITAL_COMMUNITY): Payer: Self-pay

## 2017-10-31 ENCOUNTER — Emergency Department (HOSPITAL_COMMUNITY): Payer: Medicare Other

## 2017-10-31 ENCOUNTER — Inpatient Hospital Stay (HOSPITAL_COMMUNITY)
Admission: EM | Admit: 2017-10-31 | Discharge: 2017-11-04 | DRG: 193 | Disposition: A | Discharge status: Home Health | Payer: Medicare Other | Attending: Internal Medicine | Admitting: Internal Medicine

## 2017-10-31 ENCOUNTER — Other Ambulatory Visit: Payer: Self-pay

## 2017-10-31 DIAGNOSIS — G40909 Epilepsy, unspecified, not intractable, without status epilepticus: Secondary | ICD-10-CM | POA: Diagnosis present

## 2017-10-31 DIAGNOSIS — J181 Lobar pneumonia, unspecified organism: Principal | ICD-10-CM | POA: Diagnosis present

## 2017-10-31 DIAGNOSIS — R4182 Altered mental status, unspecified: Secondary | ICD-10-CM

## 2017-10-31 DIAGNOSIS — I69351 Hemiplegia and hemiparesis following cerebral infarction affecting right dominant side: Secondary | ICD-10-CM

## 2017-10-31 DIAGNOSIS — F0150 Vascular dementia without behavioral disturbance: Secondary | ICD-10-CM | POA: Diagnosis present

## 2017-10-31 DIAGNOSIS — Z833 Family history of diabetes mellitus: Secondary | ICD-10-CM

## 2017-10-31 DIAGNOSIS — Z8669 Personal history of other diseases of the nervous system and sense organs: Secondary | ICD-10-CM

## 2017-10-31 DIAGNOSIS — Z79899 Other long term (current) drug therapy: Secondary | ICD-10-CM

## 2017-10-31 DIAGNOSIS — G9341 Metabolic encephalopathy: Secondary | ICD-10-CM | POA: Diagnosis present

## 2017-10-31 DIAGNOSIS — E43 Unspecified severe protein-calorie malnutrition: Secondary | ICD-10-CM | POA: Diagnosis present

## 2017-10-31 DIAGNOSIS — I959 Hypotension, unspecified: Secondary | ICD-10-CM | POA: Diagnosis present

## 2017-10-31 DIAGNOSIS — Z8673 Personal history of transient ischemic attack (TIA), and cerebral infarction without residual deficits: Secondary | ICD-10-CM

## 2017-10-31 DIAGNOSIS — Y95 Nosocomial condition: Secondary | ICD-10-CM | POA: Diagnosis present

## 2017-10-31 DIAGNOSIS — R7989 Other specified abnormal findings of blood chemistry: Secondary | ICD-10-CM | POA: Diagnosis present

## 2017-10-31 DIAGNOSIS — Z7901 Long term (current) use of anticoagulants: Secondary | ICD-10-CM

## 2017-10-31 DIAGNOSIS — J189 Pneumonia, unspecified organism: Secondary | ICD-10-CM | POA: Diagnosis present

## 2017-10-31 DIAGNOSIS — N390 Urinary tract infection, site not specified: Secondary | ICD-10-CM | POA: Diagnosis present

## 2017-10-31 DIAGNOSIS — K5900 Constipation, unspecified: Secondary | ICD-10-CM | POA: Diagnosis present

## 2017-10-31 DIAGNOSIS — E872 Acidosis: Secondary | ICD-10-CM | POA: Diagnosis present

## 2017-10-31 DIAGNOSIS — Z87891 Personal history of nicotine dependence: Secondary | ICD-10-CM

## 2017-10-31 DIAGNOSIS — D649 Anemia, unspecified: Secondary | ICD-10-CM | POA: Diagnosis present

## 2017-10-31 DIAGNOSIS — Z888 Allergy status to other drugs, medicaments and biological substances status: Secondary | ICD-10-CM

## 2017-10-31 DIAGNOSIS — I10 Essential (primary) hypertension: Secondary | ICD-10-CM | POA: Diagnosis present

## 2017-10-31 DIAGNOSIS — R059 Cough, unspecified: Secondary | ICD-10-CM

## 2017-10-31 DIAGNOSIS — E785 Hyperlipidemia, unspecified: Secondary | ICD-10-CM | POA: Diagnosis present

## 2017-10-31 DIAGNOSIS — R4702 Dysphasia: Secondary | ICD-10-CM | POA: Diagnosis present

## 2017-10-31 DIAGNOSIS — L89150 Pressure ulcer of sacral region, unstageable: Secondary | ICD-10-CM | POA: Diagnosis present

## 2017-10-31 DIAGNOSIS — Z66 Do not resuscitate: Secondary | ICD-10-CM | POA: Diagnosis present

## 2017-10-31 DIAGNOSIS — D638 Anemia in other chronic diseases classified elsewhere: Secondary | ICD-10-CM | POA: Diagnosis present

## 2017-10-31 DIAGNOSIS — R32 Unspecified urinary incontinence: Secondary | ICD-10-CM | POA: Diagnosis present

## 2017-10-31 DIAGNOSIS — R05 Cough: Secondary | ICD-10-CM

## 2017-10-31 DIAGNOSIS — E860 Dehydration: Secondary | ICD-10-CM | POA: Diagnosis present

## 2017-10-31 DIAGNOSIS — E1165 Type 2 diabetes mellitus with hyperglycemia: Secondary | ICD-10-CM | POA: Diagnosis present

## 2017-10-31 DIAGNOSIS — IMO0002 Reserved for concepts with insufficient information to code with codable children: Secondary | ICD-10-CM | POA: Diagnosis present

## 2017-10-31 HISTORY — DX: Urinary tract infection, site not specified: N39.0

## 2017-10-31 LAB — URINALYSIS, ROUTINE W REFLEX MICROSCOPIC
BILIRUBIN URINE: NEGATIVE
GLUCOSE, UA: 50 mg/dL — AB
Hgb urine dipstick: NEGATIVE
Ketones, ur: NEGATIVE mg/dL
NITRITE: NEGATIVE
PROTEIN: NEGATIVE mg/dL
SQUAMOUS EPITHELIAL / LPF: NONE SEEN
Specific Gravity, Urine: 1.018 (ref 1.005–1.030)
pH: 5 (ref 5.0–8.0)

## 2017-10-31 LAB — COMPREHENSIVE METABOLIC PANEL
ALK PHOS: 110 U/L (ref 38–126)
ALT: 21 U/L (ref 14–54)
ANION GAP: 9 (ref 5–15)
AST: 37 U/L (ref 15–41)
Albumin: 1.9 g/dL — ABNORMAL LOW (ref 3.5–5.0)
BILIRUBIN TOTAL: 0.4 mg/dL (ref 0.3–1.2)
BUN: 43 mg/dL — ABNORMAL HIGH (ref 6–20)
CALCIUM: 9.8 mg/dL (ref 8.9–10.3)
CO2: 23 mmol/L (ref 22–32)
CREATININE: 1.07 mg/dL — AB (ref 0.44–1.00)
Chloride: 107 mmol/L (ref 101–111)
GFR calc non Af Amer: 43 mL/min — ABNORMAL LOW (ref >60)
GFR, EST AFRICAN AMERICAN: 50 mL/min — AB (ref >60)
Glucose, Bld: 174 mg/dL — ABNORMAL HIGH (ref 65–99)
Potassium: 4.6 mmol/L (ref 3.5–5.1)
SODIUM: 139 mmol/L (ref 135–145)
TOTAL PROTEIN: 7.1 g/dL (ref 6.5–8.1)

## 2017-10-31 LAB — CULTURE, BLOOD (ROUTINE X 2)
Culture: NO GROWTH
Special Requests: ADEQUATE

## 2017-10-31 LAB — CBC
HCT: 30.4 % — ABNORMAL LOW (ref 36.0–46.0)
HEMOGLOBIN: 9.9 g/dL — AB (ref 12.0–15.0)
MCH: 28.9 pg (ref 26.0–34.0)
MCHC: 32.6 g/dL (ref 30.0–36.0)
MCV: 88.9 fL (ref 78.0–100.0)
PLATELETS: 290 10*3/uL (ref 150–400)
RBC: 3.42 MIL/uL — AB (ref 3.87–5.11)
RDW: 17.8 % — ABNORMAL HIGH (ref 11.5–15.5)
WBC: 11.6 10*3/uL — AB (ref 4.0–10.5)

## 2017-10-31 LAB — I-STAT TROPONIN, ED: TROPONIN I, POC: 0 ng/mL (ref 0.00–0.08)

## 2017-10-31 LAB — AMMONIA: AMMONIA: 53 umol/L — AB (ref 9–35)

## 2017-10-31 LAB — I-STAT CG4 LACTIC ACID, ED: Lactic Acid, Venous: 1.99 mmol/L — ABNORMAL HIGH (ref 0.5–1.9)

## 2017-10-31 MED ORDER — SODIUM CHLORIDE 0.9 % IV BOLUS (SEPSIS)
1000.0000 mL | Freq: Once | INTRAVENOUS | Status: AC
  Administered 2017-10-31: 1000 mL via INTRAVENOUS

## 2017-10-31 MED ORDER — SODIUM CHLORIDE 0.9 % IV SOLN
1.0000 g | Freq: Once | INTRAVENOUS | Status: AC
  Administered 2017-10-31: 1 g via INTRAVENOUS
  Filled 2017-10-31: qty 10

## 2017-10-31 NOTE — ED Provider Notes (Signed)
[facility]   CSN: [id number] Arrival date & time: 10/31/17  2100  LEVEL 5 CAVEAT - ALTERED MENTAL STATUS   History   Chief Complaint Chief Complaint  Patient presents with  . Altered Mental Status    HPI Megan [name] is a 84 y.o. female.  HPI  84 year old female with a history of stroke, seizure, type 2 diabetes presents with altered mental state.  History is taken from the daughter who cares for her.  The patient was admitted last week and discharged 2 days ago after being found to be altered.  At that point she was being treated for pneumonia as well as UTI.  She is on Vantin.  The patient was doing okay upon discharge but has had progressive increased sleeping and less responsiveness.  The patient ate some today but was minimal.  No vomiting.  Highest temperature has been 99.5.  The patient's normal mental status before being admitted last week was to be awake, alert, and somewhat talkative.  Past Medical History:  Diagnosis Date  . Diabetes mellitus   . Hyperlipidemia   . Seizure (HCC)   . Stroke [facility](HCC) 2012   possible stroke Jan 2016    Patient Active Problem List   Diagnosis Date Noted  . Increased ammonia level 11/01/2017  . Acute metabolic encephalopathy 11/01/2017  . Pressure injury of skin 10/26/2017  . UTI (urinary tract infection) 10/26/2017  . Heel ulcer (HCC) 06/18/2017  . Unstageable pressure ulcer of sacral region (HCC) 06/18/2017  . Decubitus ulcer of sacral region, stage 2   . Idiopathic hypotension   . Seizure (HCC) 05/21/2017  . Hyponatremia 05/12/2017  . Acute renal failure superimposed on stage 3 chronic kidney disease (HCC) 10/23/2014  . Dehydration 10/23/2014  . Acute hyperkalemia 10/23/2014  . Abnormal urinalysis 10/23/2014  . Chest pain 04/24/2014  . Altered mental state 08/08/2013  . Dementia with Lewy bodies 08/07/2013  . Vascular dementia 08/07/2013  . Sleepiness 08/07/2013  . Anemia  07/21/2013  . Protein-calorie malnutrition, severe (HCC) 07/15/2013  . Herpes zoster 07/13/2013  . Spastic hemiplegia affecting dominant side (HCC) 06/25/2013  . History of CVA w/residual right side deficits 09/03/2012  . History of partial seizures   . TIA (transient ischemic attack) 01/06/2012  . Right upper lobe pneumonia (HCC) 01/06/2012  . Diabetes mellitus type II, uncontrolled (HCC) 01/06/2012  . HTN (hypertension) 01/06/2012  . H/O 01/06/2012    Past Surgical History:  Procedure Laterality Date  . CATARACT EXTRACTION      OB History    No data available       Home Medications    Prior to Admission medications   Medication Sig Start Date End Date Taking? Authorizing Provider  Amino Acids-Protein Hydrolys (FEEDING SUPPLEMENT, PRO-STAT SUGAR FREE 64,) LIQD Take 30 mLs by mouth 4 (four) times daily -  with meals and at bedtime. 10/28/17  Yes Narda BondsNettey, Ralph A, MD  cefpodoxime (VANTIN) 200 MG tablet Take 1 tablet (200 mg total) by mouth 2 (two) times daily for 3 days. 10/29/17 11/01/17 Yes Narda BondsNettey, Ralph A, MD  clopidogrel (PLAVIX) 75 MG tablet Take 1 tablet (75 mg total) by mouth daily with breakfast. 08/15/13  Yes [name], John, MD  divalproex (DEPAKOTE ER) 500 MG 24 hr tablet Take 1 tablet (500 mg total) by mouth at bedtime. 07/14/16  Yes Micki RileySethi, Pramod S, MD  famotidine (PEPCID) 20 MG tablet Take 1 tablet (20 mg total) by mouth daily. 10/29/17  Yes  Narda Bonds, MD  ferrous sulfate 325 (65 FE) MG tablet Take 325 mg by mouth daily with breakfast.   Yes [provider]  guaiFENesin (MUCINEX) 600 MG 12 hr tablet Take 600 mg by mouth 2 (two) times daily as needed for to loosen phlegm.   Yes [provider]  Melatonin 5 MG TABS Take 5 mg by mouth at bedtime as needed (sleep).   Yes [provider]  memantine (NAMENDA XR) 14 MG CP24 24 hr capsule Take 14 mg by mouth daily.   Yes [provider]  NYSTATIN powder Apply 1 application topically 2 (two)  times daily as needed for rash. 05/04/17  Yes [provider]  polyethylene glycol (MIRALAX) packet Take 17 g by mouth daily. 10/28/17  Yes Narda Bonds, MD  protein supplement shake (PREMIER PROTEIN) LIQD Take 59.1 mLs (2 oz total) by mouth 4 (four) times daily -  with meals and at bedtime. 10/28/17  Yes Narda Bonds, MD  rosuvastatin (CRESTOR) 5 MG tablet Take 5 mg by mouth every Monday, Wednesday, and Friday.   Yes [provider]  sodium chloride 1 g tablet Take 1 g by mouth daily.   Yes [provider]  TOUJEO SOLOSTAR 300 UNIT/ML SOPN Inject 14 Units as directed every evening. Patient taking differently: Inject 18 Units as directed every evening.  06/21/17  Yes Lonia Blood, MD  vitamin C (ASCORBIC ACID) 500 MG tablet Take 500 mg by mouth daily.   Yes [provider]  zinc oxide (BALMEX) 11.3 % CREA cream Apply 1 application topically daily as needed (for rash).    Yes [provider]    Family History Family History  Problem Relation Age of Onset  . CAD Mother   . Hypertension Mother   . Diabetes Mother   . Stroke Mother     Social History Social History   Tobacco Use  . Smoking status: Never Smoker  . Smokeless tobacco: Former Neurosurgeon    Types: Snuff  . Tobacco comment: quit 50-60 years ago  Substance Use Topics  . Alcohol use: No    Alcohol/week: 0.0 oz  . Drug use: No     Allergies   Lipitor [atorvastatin]   Review of Systems Review of Systems  Unable to perform ROS: Mental status change     Physical Exam Updated Vital Signs BP (!) 137/106 (BP Location: Right Arm)   Pulse 83   Temp 99 F (37.2 C) (Rectal)   Resp 20   Ht 5\' 2"  (1.575 m)   Wt 67.1 kg (148 lb)   SpO2 96%   BMI 27.07 kg/m   Physical Exam  Constitutional: She appears well-developed and well-nourished. She appears lethargic.  HENT:  Head: Normocephalic and atraumatic.  Right Ear: External ear normal.  Left Ear: External ear normal.    Nose: Nose normal.  Right facial droop  Eyes: Right eye exhibits no discharge. Left eye exhibits no discharge.  Cardiovascular: Normal rate, regular rhythm and normal heart sounds.  Pulmonary/Chest: Effort normal and breath sounds normal.  Abdominal: Soft. She exhibits no distension. There is no tenderness.  Neurological: She appears lethargic.  Patient is sleeping but able to be awoken. When awoken she responds with "fine" but doesn't say anything else or follow other commands.  Skin: Skin is warm and dry.  Nursing note and vitals reviewed.    ED Treatments / Results  Labs (all labs ordered are listed, but only abnormal results are  displayed) Labs Reviewed  COMPREHENSIVE METABOLIC PANEL - Abnormal; Notable for the following components:      Result Value   Glucose, Bld 174 (*)    BUN 43 (*)    Creatinine, Ser 1.07 (*)    Albumin 1.9 (*)    GFR calc non Af Amer 43 (*)    GFR calc Af Amer 50 (*)    All other components within normal limits  CBC - Abnormal; Notable for the following components:   WBC 11.6 (*)    RBC 3.42 (*)    Hemoglobin 9.9 (*)    HCT 30.4 (*)    RDW 17.8 (*)    All other components within normal limits  URINALYSIS, ROUTINE W REFLEX MICROSCOPIC - Abnormal; Notable for the following components:   APPearance HAZY (*)    Glucose, UA 50 (*)    Leukocytes, UA LARGE (*)    Bacteria, UA RARE (*)    All other components within normal limits  AMMONIA - Abnormal; Notable for the following components:   Ammonia 53 (*)    All other components within normal limits  I-STAT CG4 LACTIC ACID, ED - Abnormal; Notable for the following components:   Lactic Acid, Venous 1.99 (*)    All other components within normal limits  URINE CULTURE  PROTIME-INR  TROPONIN I  TROPONIN I  TROPONIN I  CK  VALPROIC ACID LEVEL  AMMONIA  VITAMIN B12  FOLATE  IRON AND TIBC  FERRITIN  RETICULOCYTES  SODIUM, URINE, RANDOM  CREATININE, URINE, RANDOM  I-STAT TROPONIN, ED    EKG   EKG Interpretation None       Radiology Dg Chest 1 View  Result Date: 10/31/2017 CLINICAL DATA:  84 year old female with altered mental status. EXAM: CHEST 1 VIEW COMPARISON:  Chest radiograph dated 10/26/2017 FINDINGS: There is shallow inspiration with bibasilar atelectasis/scarring. An area of increased density in the right upper lung field may be chronic or represent developing infiltrate. Clinical correlation is recommended. There is no large pleural effusion. No pneumothorax. Stable cardiac silhouette. Atherosclerotic calcification of the aortic arch. Osteopenia with degenerative changes of the spine. No acute osseous pathology. IMPRESSION: Chronic changes and interstitial coarsening versus developing infiltrate in the right upper lung field. Clinical correlation is recommended. Electronically Signed   By: Elgie Collard M.D.   On: 10/31/2017 23:19   Ct Head Wo Contrast  Result Date: 10/31/2017 CLINICAL DATA:  84 y/o  F; evaluation of altered mental status. EXAM: CT HEAD WITHOUT CONTRAST TECHNIQUE: Contiguous axial images were obtained from the base of the skull through the vertex without intravenous contrast. COMPARISON:  10/26/2017 CT head FINDINGS: Brain: No evidence of acute infarction, hemorrhage, hydrocephalus, extra-axial collection or mass lesion/mass effect. Stable advanced chronic microvascular ischemic changes, diffuse parenchymal volume loss of the brain, and basal ganglia chronic lacunar infarctions. Vascular: Calcific atherosclerosis of carotid siphons and vertebral arteries. No hyperdense vessel identified. Skull: Normal. Negative for fracture or focal lesion. Sinuses/Orbits: Partial right mastoid effusion. Bilateral intra-ocular lens replacement. Otherwise negative. Other: None. IMPRESSION: 1. No acute intracranial abnormality identified. 2. Stable advanced chronic microvascular ischemic changes, parenchymal volume loss, and basal ganglia lacunar infarcts. Electronically Signed    By: Mitzi Hansen M.D.   On: 10/31/2017 23:05    Procedures Procedures (including critical care time)  Medications Ordered in ED Medications  sodium chloride 0.9 % bolus 1,000 mL (0 mLs Intravenous Stopped 10/31/17 2242)  cefTRIAXone (ROCEPHIN) 1 g in sodium chloride 0.9 % 100 mL IVPB (0  g Intravenous Stopped 11/01/17 0019)     Initial Impression / Assessment and Plan / ED Course  I have reviewed the triage vital signs and the nursing notes.  Pertinent labs & imaging results that were available during my care of the patient were reviewed by me and considered in my medical decision making (see chart for details).     Patient's mental status has improved after IV fluids.  Likely poor p.o. intake and dehydration is playing a role.  Her mild bump in creatinine corresponds with this.  Her urine is still consistent with a UTI, unclear if this represents treatment failure but given the worsening mental status I think that she will need to be brought back out.  No urine culture was sent last time and will be sent this time.  She will need readmission to the hospital for supportive care.  Dr. Adela Glimpse to admit.  Final Clinical Impressions(s) / ED Diagnoses   Final diagnoses:  Altered mental status, unspecified altered mental status type  Urinary tract infection in elderly patient    ED Discharge Orders    None       Pricilla Loveless, MD 11/01/17 0040

## 2017-10-31 NOTE — ED Triage Notes (Signed)
Pt from home for eval of altered mental status. Here 3/6 for same complaint. Pt admitted at that time for PNA. Pt is lethargic and non verbal at this time. Per EMS and prev note, pt w/ hx of CVA R sided weakness/residual.

## 2017-11-01 ENCOUNTER — Inpatient Hospital Stay (HOSPITAL_COMMUNITY): Payer: Medicare Other

## 2017-11-01 DIAGNOSIS — I959 Hypotension, unspecified: Secondary | ICD-10-CM | POA: Diagnosis present

## 2017-11-01 DIAGNOSIS — E860 Dehydration: Secondary | ICD-10-CM

## 2017-11-01 DIAGNOSIS — Z66 Do not resuscitate: Secondary | ICD-10-CM | POA: Diagnosis present

## 2017-11-01 DIAGNOSIS — J181 Lobar pneumonia, unspecified organism: Secondary | ICD-10-CM | POA: Diagnosis present

## 2017-11-01 DIAGNOSIS — Z8669 Personal history of other diseases of the nervous system and sense organs: Secondary | ICD-10-CM

## 2017-11-01 DIAGNOSIS — E43 Unspecified severe protein-calorie malnutrition: Secondary | ICD-10-CM

## 2017-11-01 DIAGNOSIS — D649 Anemia, unspecified: Secondary | ICD-10-CM | POA: Diagnosis not present

## 2017-11-01 DIAGNOSIS — Z87891 Personal history of nicotine dependence: Secondary | ICD-10-CM | POA: Diagnosis not present

## 2017-11-01 DIAGNOSIS — G9341 Metabolic encephalopathy: Secondary | ICD-10-CM | POA: Diagnosis present

## 2017-11-01 DIAGNOSIS — Z8673 Personal history of transient ischemic attack (TIA), and cerebral infarction without residual deficits: Secondary | ICD-10-CM | POA: Diagnosis not present

## 2017-11-01 DIAGNOSIS — I10 Essential (primary) hypertension: Secondary | ICD-10-CM

## 2017-11-01 DIAGNOSIS — R4702 Dysphasia: Secondary | ICD-10-CM | POA: Diagnosis present

## 2017-11-01 DIAGNOSIS — I69351 Hemiplegia and hemiparesis following cerebral infarction affecting right dominant side: Secondary | ICD-10-CM | POA: Diagnosis not present

## 2017-11-01 DIAGNOSIS — J189 Pneumonia, unspecified organism: Secondary | ICD-10-CM | POA: Diagnosis present

## 2017-11-01 DIAGNOSIS — R05 Cough: Secondary | ICD-10-CM | POA: Diagnosis not present

## 2017-11-01 DIAGNOSIS — E1165 Type 2 diabetes mellitus with hyperglycemia: Secondary | ICD-10-CM

## 2017-11-01 DIAGNOSIS — G40909 Epilepsy, unspecified, not intractable, without status epilepticus: Secondary | ICD-10-CM | POA: Diagnosis present

## 2017-11-01 DIAGNOSIS — Z7901 Long term (current) use of anticoagulants: Secondary | ICD-10-CM | POA: Diagnosis not present

## 2017-11-01 DIAGNOSIS — Z833 Family history of diabetes mellitus: Secondary | ICD-10-CM | POA: Diagnosis not present

## 2017-11-01 DIAGNOSIS — R4182 Altered mental status, unspecified: Secondary | ICD-10-CM | POA: Diagnosis present

## 2017-11-01 DIAGNOSIS — L89150 Pressure ulcer of sacral region, unstageable: Secondary | ICD-10-CM

## 2017-11-01 DIAGNOSIS — R7989 Other specified abnormal findings of blood chemistry: Secondary | ICD-10-CM | POA: Diagnosis present

## 2017-11-01 DIAGNOSIS — K5900 Constipation, unspecified: Secondary | ICD-10-CM | POA: Diagnosis present

## 2017-11-01 DIAGNOSIS — R32 Unspecified urinary incontinence: Secondary | ICD-10-CM | POA: Diagnosis present

## 2017-11-01 DIAGNOSIS — F0150 Vascular dementia without behavioral disturbance: Secondary | ICD-10-CM

## 2017-11-01 DIAGNOSIS — E872 Acidosis: Secondary | ICD-10-CM | POA: Diagnosis present

## 2017-11-01 DIAGNOSIS — E785 Hyperlipidemia, unspecified: Secondary | ICD-10-CM | POA: Diagnosis present

## 2017-11-01 DIAGNOSIS — D638 Anemia in other chronic diseases classified elsewhere: Secondary | ICD-10-CM | POA: Diagnosis present

## 2017-11-01 DIAGNOSIS — N3000 Acute cystitis without hematuria: Secondary | ICD-10-CM

## 2017-11-01 DIAGNOSIS — Y95 Nosocomial condition: Secondary | ICD-10-CM | POA: Diagnosis present

## 2017-11-01 DIAGNOSIS — N390 Urinary tract infection, site not specified: Secondary | ICD-10-CM | POA: Diagnosis present

## 2017-11-01 DIAGNOSIS — Z888 Allergy status to other drugs, medicaments and biological substances status: Secondary | ICD-10-CM | POA: Diagnosis not present

## 2017-11-01 DIAGNOSIS — Z79899 Other long term (current) drug therapy: Secondary | ICD-10-CM | POA: Diagnosis not present

## 2017-11-01 LAB — MAGNESIUM: Magnesium: 2.2 mg/dL (ref 1.7–2.4)

## 2017-11-01 LAB — HEMOGLOBIN A1C
Hgb A1c MFr Bld: 8.6 % — ABNORMAL HIGH (ref 4.8–5.6)
MEAN PLASMA GLUCOSE: 200.12 mg/dL

## 2017-11-01 LAB — PROCALCITONIN: Procalcitonin: 0.24 ng/mL

## 2017-11-01 LAB — IRON AND TIBC
IRON: 28 ug/dL (ref 28–170)
Saturation Ratios: 13 % (ref 10.4–31.8)
TIBC: 217 ug/dL — AB (ref 250–450)
UIBC: 189 ug/dL

## 2017-11-01 LAB — TROPONIN I
Troponin I: <0.03 ng/mL (ref <0.03)
Troponin I: <0.03 ng/mL (ref <0.03)

## 2017-11-01 LAB — COMPREHENSIVE METABOLIC PANEL
ALBUMIN: 2 g/dL — AB (ref 3.5–5.0)
ALK PHOS: 110 U/L (ref 38–126)
ALT: 20 U/L (ref 14–54)
AST: 35 U/L (ref 15–41)
Anion gap: 10 (ref 5–15)
BUN: 36 mg/dL — ABNORMAL HIGH (ref 6–20)
CHLORIDE: 107 mmol/L (ref 101–111)
CO2: 23 mmol/L (ref 22–32)
Calcium: 9.5 mg/dL (ref 8.9–10.3)
Creatinine, Ser: 0.87 mg/dL (ref 0.44–1.00)
GFR calc non Af Amer: 56 mL/min — ABNORMAL LOW (ref >60)
GLUCOSE: 186 mg/dL — AB (ref 65–99)
POTASSIUM: 4.7 mmol/L (ref 3.5–5.1)
SODIUM: 140 mmol/L (ref 135–145)
Total Bilirubin: 0.5 mg/dL (ref 0.3–1.2)
Total Protein: 7.1 g/dL (ref 6.5–8.1)

## 2017-11-01 LAB — GLUCOSE, CAPILLARY
GLUCOSE-CAPILLARY: 203 mg/dL — AB (ref 65–99)
GLUCOSE-CAPILLARY: 154 mg/dL — AB (ref 65–99)
Glucose-Capillary: 165 mg/dL — ABNORMAL HIGH (ref 65–99)

## 2017-11-01 LAB — RETICULOCYTES
RBC.: 3.7 MIL/uL — ABNORMAL LOW (ref 3.87–5.11)
Retic Count, Absolute: 66.6 10*3/uL (ref 19.0–186.0)
Retic Ct Pct: 1.8 % (ref 0.4–3.1)

## 2017-11-01 LAB — LACTIC ACID, PLASMA: LACTIC ACID, VENOUS: 1.9 mmol/L (ref 0.5–1.9)

## 2017-11-01 LAB — CBG MONITORING, ED
GLUCOSE-CAPILLARY: 170 mg/dL — AB (ref 65–99)
GLUCOSE-CAPILLARY: 169 mg/dL — AB (ref 65–99)
GLUCOSE-CAPILLARY: 174 mg/dL — AB (ref 65–99)

## 2017-11-01 LAB — CK: Total CK: 126 U/L (ref 38–234)

## 2017-11-01 LAB — FERRITIN: Ferritin: 71 ng/mL (ref 11–307)

## 2017-11-01 LAB — PROTIME-INR
INR: 1.08
Prothrombin Time: 13.9 seconds (ref 11.4–15.2)

## 2017-11-01 LAB — VITAMIN B12: VITAMIN B 12: 788 pg/mL (ref 180–914)

## 2017-11-01 LAB — AMMONIA: AMMONIA: 33 umol/L (ref 9–35)

## 2017-11-01 LAB — CREATININE, URINE, RANDOM: Creatinine, Urine: 70.15 mg/dL

## 2017-11-01 LAB — VALPROIC ACID LEVEL: Valproic Acid Lvl: 40 ug/mL — ABNORMAL LOW (ref 50.0–100.0)

## 2017-11-01 LAB — PHOSPHORUS: Phosphorus: 2.8 mg/dL (ref 2.5–4.6)

## 2017-11-01 LAB — TSH: TSH: 1.151 u[IU]/mL (ref 0.350–4.500)

## 2017-11-01 LAB — PREALBUMIN: Prealbumin: 8 mg/dL — ABNORMAL LOW (ref 18–38)

## 2017-11-01 LAB — FOLATE: Folate: 18.9 ng/mL (ref >5.9)

## 2017-11-01 LAB — SODIUM, URINE, RANDOM: SODIUM UR: 55 mmol/L

## 2017-11-01 LAB — STREP PNEUMONIAE URINARY ANTIGEN: STREP PNEUMO URINARY ANTIGEN: NEGATIVE

## 2017-11-01 MED ORDER — PIPERACILLIN-TAZOBACTAM 3.375 G IVPB
3.3750 g | Freq: Three times a day (TID) | INTRAVENOUS | Status: DC
  Administered 2017-11-01 – 2017-11-04 (×9): 3.375 g via INTRAVENOUS
  Filled 2017-11-01 (×12): qty 50

## 2017-11-01 MED ORDER — ONDANSETRON HCL 4 MG PO TABS: 4.0000 mg | ORAL_TABLET | Freq: Four times a day (QID) | ORAL | Status: DC | PRN

## 2017-11-01 MED ORDER — CLOPIDOGREL BISULFATE 75 MG PO TABS
75.0000 mg | ORAL_TABLET | Freq: Every day | ORAL | Status: DC
  Administered 2017-11-01 – 2017-11-04 (×4): 75 mg via ORAL
  Filled 2017-11-01 (×4): qty 1

## 2017-11-01 MED ORDER — ACETAMINOPHEN 325 MG PO TABS: 650.0000 mg | ORAL_TABLET | Freq: Four times a day (QID) | ORAL | Status: DC | PRN

## 2017-11-01 MED ORDER — INSULIN GLARGINE 100 UNIT/ML ~~LOC~~ SOLN
18.0000 [IU] | Freq: Every evening | SUBCUTANEOUS | Status: DC
  Administered 2017-11-01 – 2017-11-03 (×3): 18 [IU] via SUBCUTANEOUS
  Filled 2017-11-01 (×4): qty 0.18

## 2017-11-01 MED ORDER — ALBUTEROL SULFATE (2.5 MG/3ML) 0.083% IN NEBU: 2.5000 mg | INHALATION_SOLUTION | RESPIRATORY_TRACT | Status: DC | PRN

## 2017-11-01 MED ORDER — ONDANSETRON HCL 4 MG/2ML IJ SOLN: 4.0000 mg | Freq: Four times a day (QID) | INTRAMUSCULAR | Status: DC | PRN

## 2017-11-01 MED ORDER — BISACODYL 10 MG RE SUPP: 10.0000 mg | Freq: Every day | RECTAL | Status: DC | PRN

## 2017-11-01 MED ORDER — PREMIER PROTEIN SHAKE
2.0000 [oz_av] | Freq: Three times a day (TID) | ORAL | Status: DC
  Administered 2017-11-01 (×2): 2 [oz_av] via ORAL
  Filled 2017-11-01 (×7): qty 325.31

## 2017-11-01 MED ORDER — HYDROCODONE-ACETAMINOPHEN 5-325 MG PO TABS
1.0000 | ORAL_TABLET | ORAL | Status: DC | PRN
  Administered 2017-11-01: 2 via ORAL
  Filled 2017-11-01: qty 2

## 2017-11-01 MED ORDER — ROSUVASTATIN CALCIUM 5 MG PO TABS
5.0000 mg | ORAL_TABLET | ORAL | Status: DC
  Administered 2017-11-02: 5 mg via ORAL
  Filled 2017-11-01 (×2): qty 1

## 2017-11-01 MED ORDER — LACTULOSE 10 GM/15ML PO SOLN
20.0000 g | Freq: Two times a day (BID) | ORAL | Status: DC
  Administered 2017-11-01 – 2017-11-02 (×3): 20 g via ORAL
  Filled 2017-11-01 (×3): qty 30

## 2017-11-01 MED ORDER — INSULIN ASPART 100 UNIT/ML ~~LOC~~ SOLN
0.0000 [IU] | SUBCUTANEOUS | Status: DC
  Administered 2017-11-01 (×2): 2 [IU] via SUBCUTANEOUS
  Administered 2017-11-01: 3 [IU] via SUBCUTANEOUS
  Administered 2017-11-01 (×2): 2 [IU] via SUBCUTANEOUS
  Administered 2017-11-02: 3 [IU] via SUBCUTANEOUS
  Administered 2017-11-02: 2 [IU] via SUBCUTANEOUS
  Administered 2017-11-02: 1 [IU] via SUBCUTANEOUS
  Administered 2017-11-02: 2 [IU] via SUBCUTANEOUS
  Administered 2017-11-02: 1 [IU] via SUBCUTANEOUS
  Administered 2017-11-02: 2 [IU] via SUBCUTANEOUS
  Administered 2017-11-03: 3 [IU] via SUBCUTANEOUS
  Administered 2017-11-03: 5 [IU] via SUBCUTANEOUS
  Administered 2017-11-03: 3 [IU] via SUBCUTANEOUS
  Administered 2017-11-03: 2 [IU] via SUBCUTANEOUS
  Administered 2017-11-03: 3 [IU] via SUBCUTANEOUS
  Administered 2017-11-03: 7 [IU] via SUBCUTANEOUS
  Administered 2017-11-04: 1 [IU] via SUBCUTANEOUS
  Administered 2017-11-04: 5 [IU] via SUBCUTANEOUS
  Administered 2017-11-04: 2 [IU] via SUBCUTANEOUS
  Administered 2017-11-04: 1 [IU] via SUBCUTANEOUS
  Filled 2017-11-01 (×2): qty 1

## 2017-11-01 MED ORDER — ACETAMINOPHEN 650 MG RE SUPP: 650.0000 mg | Freq: Four times a day (QID) | RECTAL | Status: DC | PRN

## 2017-11-01 MED ORDER — VANCOMYCIN HCL 10 G IV SOLR
1250.0000 mg | Freq: Once | INTRAVENOUS | Status: AC
  Administered 2017-11-01: 1250 mg via INTRAVENOUS
  Filled 2017-11-01 (×2): qty 1250

## 2017-11-01 MED ORDER — MEMANTINE HCL ER 14 MG PO CP24
14.0000 mg | ORAL_CAPSULE | Freq: Every day | ORAL | Status: DC
  Filled 2017-11-01: qty 1

## 2017-11-01 MED ORDER — MEMANTINE HCL ER 14 MG PO CP24
14.0000 mg | ORAL_CAPSULE | Freq: Every day | ORAL | Status: DC
Start: 2017-11-01 — End: 2017-11-04
  Administered 2017-11-01 – 2017-11-03 (×3): 14 mg via ORAL
  Filled 2017-11-01 (×4): qty 1

## 2017-11-01 MED ORDER — PRO-STAT SUGAR FREE PO LIQD
30.0000 mL | Freq: Two times a day (BID) | ORAL | Status: DC
  Administered 2017-11-02 – 2017-11-03 (×4): 30 mL via ORAL
  Filled 2017-11-01 (×5): qty 30

## 2017-11-01 MED ORDER — VANCOMYCIN HCL IN DEXTROSE 750-5 MG/150ML-% IV SOLN
750.0000 mg | INTRAVENOUS | Status: DC
  Filled 2017-11-01: qty 150

## 2017-11-01 MED ORDER — SODIUM CHLORIDE 0.9 % IV SOLN
INTRAVENOUS | Status: AC
  Administered 2017-11-01: 03:00:00 via INTRAVENOUS

## 2017-11-01 MED ORDER — PIPERACILLIN-TAZOBACTAM 3.375 G IVPB
3.3750 g | Freq: Once | INTRAVENOUS | Status: AC
  Administered 2017-11-01: 3.375 g via INTRAVENOUS
  Filled 2017-11-01: qty 50

## 2017-11-01 MED ORDER — VANCOMYCIN HCL IN DEXTROSE 750-5 MG/150ML-% IV SOLN
750.0000 mg | INTRAVENOUS | Status: DC
  Administered 2017-11-02 – 2017-11-03 (×2): 750 mg via INTRAVENOUS
  Filled 2017-11-01 (×2): qty 150

## 2017-11-01 MED ORDER — FERROUS SULFATE 325 (65 FE) MG PO TABS
325.0000 mg | ORAL_TABLET | Freq: Every day | ORAL | Status: DC
  Administered 2017-11-01 – 2017-11-04 (×4): 325 mg via ORAL
  Filled 2017-11-01 (×4): qty 1

## 2017-11-01 MED ORDER — POLYETHYLENE GLYCOL 3350 17 G PO PACK
17.0000 g | PACK | Freq: Every day | ORAL | Status: DC
  Filled 2017-11-01: qty 1

## 2017-11-01 MED ORDER — GUAIFENESIN ER 600 MG PO TB12: 600.0000 mg | ORAL_TABLET | Freq: Two times a day (BID) | ORAL | Status: DC | PRN

## 2017-11-01 MED ORDER — DIVALPROEX SODIUM ER 500 MG PO TB24
500.0000 mg | ORAL_TABLET | Freq: Every day | ORAL | Status: DC
  Administered 2017-11-01: 500 mg via ORAL
  Filled 2017-11-01 (×2): qty 1

## 2017-11-01 MED ORDER — PREMIER PROTEIN SHAKE
11.0000 [oz_av] | Freq: Two times a day (BID) | ORAL | Status: DC
  Administered 2017-11-02 – 2017-11-04 (×5): 11 [oz_av] via ORAL
  Filled 2017-11-01 (×9): qty 325.31

## 2017-11-01 MED ORDER — SODIUM CHLORIDE 0.9 % IV BOLUS (SEPSIS)
500.0000 mL | Freq: Once | INTRAVENOUS | Status: AC
  Administered 2017-11-01: 500 mL via INTRAVENOUS

## 2017-11-01 MED ORDER — DIVALPROEX SODIUM 125 MG PO CSDR
625.0000 mg | DELAYED_RELEASE_CAPSULE | Freq: Every day | ORAL | Status: DC
  Administered 2017-11-01 – 2017-11-03 (×3): 625 mg via ORAL
  Filled 2017-11-01 (×4): qty 5

## 2017-11-01 MED ORDER — ADULT MULTIVITAMIN W/MINERALS CH
1.0000 | ORAL_TABLET | Freq: Every day | ORAL | Status: DC
  Administered 2017-11-01 – 2017-11-04 (×4): 1 via ORAL
  Filled 2017-11-01 (×4): qty 1

## 2017-11-01 NOTE — Progress Notes (Signed)
Pharmacy Antibiotic Note  Megan [name] is a 84 y.o. female admitted on 10/31/2017 with altered mental status. Pt was recently admitted for the same chief complaint. Starting empiric antibiotics for possible pneumonia.   Plan: -Vancomycin 1250 mg IV x1 then 750 mg IV q12h -Zosyn 3.375 g IV q8h -Monitor renal fx, cultures, VT at steady state   Height: 5\' 2"  (157.5 cm) Weight: 148 lb (67.1 kg) IBW/kg (Calculated) : 50.1  Temp (24hrs), Avg:99 F (37.2 C), Min:99 F (37.2 C), Max:99 F (37.2 C)  Recent Labs  Lab 10/26/17 1213 10/26/17 1404 10/26/17 1642 10/27/17 0611 10/31/17 2115 10/31/17 2136  WBC 14.2*  --   --  10.5 11.6*  --   CREATININE 1.04*  --   --  0.83 1.07*  --   LATICACIDVEN  --  2.13* 1.35  --   --  1.99*    Estimated Creatinine Clearance: 29.5 mL/min (A) (by C-G formula based on SCr of 1.07 mg/dL (H)).    Allergies  Allergen Reactions  . Lipitor [Atorvastatin] Other (See Comments)    Myalgias     Antimicrobials this admission: 3/12 vancomycin > 3/12 zosyn >   Dose adjustments this admission: N/A  Microbiology results: 3/11 urine cx: 3/12 resp cx:   Baldemar FridayMasters, Kenyada Dosch M 11/01/2017 1:33 AM

## 2017-11-01 NOTE — ED Triage Notes (Signed)
CBG 174  

## 2017-11-01 NOTE — Progress Notes (Addendum)
Patient arrived to 6N16 arousable when spoken to but non verbal, has 2 IV sites intact and saline locked.  Daughter present at bedside and says pt has been non verbal since 3/8.  Noted to have foam present on sacrum and left heel.  No other skin issues present.  Family oriented to room and equipment. B/P low on arrival, MD made aware.  Will continue to monitor.

## 2017-11-01 NOTE — H&P (Signed)
Megan [name] GNF:[id number] DOB: 1921/11/15 DOA: 10/31/2017     PCP: [name], MD   Outpatient Specialists: Neurology Sethi  Patient coming from:    home Lives  With family   Chief Complaint: confusion  HPI: Megan [name] is a 84 y.o. female with medical history significant of Hyperlipidemia, Dm2, CVA, seizure do, dementia, history of CVA 2013, decubitus ulcers    Presented with worsening confusion and decreased responsiveness decreased p.o. intake.  Low-grade fevers up to 99.5 Just recently admitted for the same at which point she was diagnosed with right upper lobe pneumonia and UTI no urine culture was sent with patient was discharged home on 8 March on Vantin Patient clinically improved on IV antibiotics initially aspiration was suspected but since patient improved and felt to be less likely.  She was treated with azithromycin and Ventin while at home patient has history of decubitus ulcer for which she was recommended to have wound care as an outpatient known history of diabetes on long-standing insulin.  History of stroke in the past family states she takes Plavix she has residual right-sided weakness Known history of seizure disorder on Depakote Patient was discharged on dysphagia 1 diet and her Protonix was stopped in case patient was aspirating  Patient have had trouble swallowing her food seems it is sitting in her mouth.  Family states she has been taking her medications. Sh has ben sneezing more. Family states she have had episodes of gaging on food.  SLP has evaluated patient during admission and felt she could tolerate dysphagia soft diet with thin fluids.   Family reports no BM for the past few days.  Family not sure when was the last seizure her seizures are more of staring into space and were only detected on EEG  While in ER: Patient was given IV fluids and mental status improved after rehydration Significant initial  Findings: Ammonia slightly elevated at  53 Lactic acid 1.99 Sodium 130 9K4.6 BUN 43 up from baseline creatinine 1.07 up from 0.83 at the time of discharge Albumin 1.9 WBCs 11.6 hemoglobin 9.9 down from 11.5 at the time of discharge  Chest x-ray showing chronic changes versus development infiltrates in the right upper lung field  CT head showing chronic changes there is still evidence of partial right mastoid effusion  IN ER:  Temp (24hrs), Avg:99 F (37.2 C), Min:99 F (37.2 C), Max:99 F (37.2 C)      on arrival  ED Triage Vitals  Enc Vitals Group     BP 10/31/17 2115 138/64     Pulse Rate 10/31/17 2115 78     Resp 10/31/17 2115 18     Temp 10/31/17 2115 99 F (37.2 C)     Temp Source 10/31/17 2115 Rectal     SpO2 10/31/17 2101 94 %     Weight 10/31/17 2105 148 lb (67.1 kg)     Height 10/31/17 2105 5\' 2"  (1.575 m)     Head Circumference --      Peak Flow --      Pain Score --      Pain Loc --      Pain Edu? --      Excl. in GC? --     Latest RR 2 0 sats 96% HR 83 BP 137/106  Following Medications were ordered in ER: Medications  cefTRIAXone (ROCEPHIN) 1 g in sodium chloride 0.9 % 100 mL IVPB (1 g Intravenous New Bag/Given 10/31/17 2349)  sodium chloride 0.9 %  bolus 1,000 mL (0 mLs Intravenous Stopped 10/31/17 2242)     Hospitalist was called for admission for acute encephalopathy in the setting of recurrent dehydration possible recurrent infection      Review of Systems:    Pertinent positives include:  fatigue, confusion  Constitutional:  No weight loss, night sweats, Fevers, chills, weight loss  HEENT:  No headaches, Difficulty swallowing,Tooth/dental problems,Sore throat,  No sneezing, itching, ear ache, nasal congestion, post nasal drip,  Cardio-vascular:  No chest pain, Orthopnea, PND, anasarca, dizziness, palpitations.no Bilateral lower extremity swelling  GI:  No heartburn, indigestion, abdominal pain, nausea, vomiting, diarrhea, change in bowel habits, loss of appetite, melena, blood in  stool, hematemesis Resp:  no shortness of breath at rest. No dyspnea on exertion, No excess mucus, no productive cough, No non-productive cough, No coughing up of blood.No change in color of mucus.No wheezing. Skin:  no rash or lesions. No jaundice GU:  no dysuria, change in color of urine, no urgency or frequency. No straining to urinate.  No flank pain.  Musculoskeletal:  No joint pain or no joint swelling. No decreased range of motion. No back pain.  Psych:  No change in mood or affect. No depression or anxiety. No memory loss.  Neuro: no localizing neurological complaints, no tingling, no weakness, no double vision, no gait abnormality, no slurred speech,   As per HPI otherwise 10 point review of systems negative.   Past Medical History: Past Medical History:  Diagnosis Date  . Diabetes mellitus   . Hyperlipidemia   . Seizure (HCC)   . Stroke [facility]) 2012   possible stroke Jan 2016   Past Surgical History:  Procedure Laterality Date  . CATARACT EXTRACTION       Social History:  Ambulatory   bed bound    reports that  has never smoked. She has quit using smokeless tobacco. Her smokeless tobacco use included snuff. She reports that she does not drink alcohol or use drugs.  Allergies:   Allergies  Allergen Reactions  . Lipitor [Atorvastatin] Other (See Comments)    Myalgias        Family History:   Family History  Problem Relation Age of Onset  . CAD Mother   . Hypertension Mother   . Diabetes Mother   . Stroke Mother     Medications: Prior to Admission medications   Medication Sig Start Date End Date Taking? Authorizing Provider  Amino Acids-Protein Hydrolys (FEEDING SUPPLEMENT, PRO-STAT SUGAR FREE 64,) LIQD Take 30 mLs by mouth 4 (four) times daily -  with meals and at bedtime. 10/28/17  Yes Narda Bonds, MD  cefpodoxime (VANTIN) 200 MG tablet Take 1 tablet (200 mg total) by mouth 2 (two) times daily for 3 days. 10/29/17 11/01/17 Yes Narda Bonds, MD   clopidogrel (PLAVIX) 75 MG tablet Take 1 tablet (75 mg total) by mouth daily with breakfast. 08/15/13  Yes [name], MD  divalproex (DEPAKOTE ER) 500 MG 24 hr tablet Take 1 tablet (500 mg total) by mouth at bedtime. 07/14/16  Yes Micki Riley, MD  famotidine (PEPCID) 20 MG tablet Take 1 tablet (20 mg total) by mouth daily. 10/29/17  Yes Narda Bonds, MD  ferrous sulfate 325 (65 FE) MG tablet Take 325 mg by mouth daily with breakfast.   Yes [provider]  guaiFENesin (MUCINEX) 600 MG 12 hr tablet Take 600 mg by mouth 2 (two) times daily as needed for to loosen phlegm.   Yes [provider]  Melatonin 5 MG TABS Take 5 mg by mouth at bedtime as needed (sleep).   Yes [provider]  memantine (NAMENDA XR) 14 MG CP24 24 hr capsule Take 14 mg by mouth daily.   Yes [provider]  NYSTATIN powder Apply 1 application topically 2 (two) times daily as needed for rash. 05/04/17  Yes [provider]  polyethylene glycol (MIRALAX) packet Take 17 g by mouth daily. 10/28/17  Yes Narda Bonds, MD  protein supplement shake (PREMIER PROTEIN) LIQD Take 59.1 mLs (2 oz total) by mouth 4 (four) times daily -  with meals and at bedtime. 10/28/17  Yes Narda Bonds, MD  rosuvastatin (CRESTOR) 5 MG tablet Take 5 mg by mouth every Monday, Wednesday, and Friday.   Yes [provider]  sodium chloride 1 g tablet Take 1 g by mouth daily.   Yes [provider]  TOUJEO SOLOSTAR 300 UNIT/ML SOPN Inject 14 Units as directed every evening. Patient taking differently: Inject 18 Units as directed every evening.  06/21/17  Yes Lonia Blood, MD  vitamin C (ASCORBIC ACID) 500 MG tablet Take 500 mg by mouth daily.   Yes [provider]  zinc oxide (BALMEX) 11.3 % CREA cream Apply 1 application topically daily as needed (for rash).    Yes [provider]    Physical Exam: Patient Vitals for the past 24 hrs:  BP Temp Temp src Pulse Resp  SpO2 Height Weight  10/31/17 2340 (!) 137/106 - - 83 20 96 % - -  10/31/17 2300 (!) 151/70 - - 83 18 97 % - -  10/31/17 2200 134/68 - - 76 (!) 31 96 % - -  10/31/17 2140 (!) 136/58 - - 78 (!) 29 94 % - -  10/31/17 2115 138/64 99 F (37.2 C) Rectal 78 18 98 % - -  10/31/17 2105 - - - - - - 5\' 2"  (1.575 m) 67.1 kg (148 lb)  10/31/17 2101 - - - - - 94 % - -    1. General:  in No Acute distress  cachectic -appearing 2. Psychological: somnolent not Oriented 3. Head/ENT:    Dry Mucous Membranes                          Head Non traumatic, neck supple                            Poor Dentition 4. SKIN:  decreased Skin turgor,  Skin clean Dry and intact no rash, DuoDerm on the sacrum 5. Heart: Regular rate and rhythm slight systolic Murmur, no Rub or gallop 6. Lungs:   no wheezes or crackles   7. Abdomen: Soft non-tender,  distended  obese   8. Lower extremities: no clubbing, cyanosis, or edema 9. Neurologically Grossly intact, moving all 4 extremities equally no  asterexis 10. MSK: Normal range of motion   body mass index is 27.07 kg/m.  Labs on Admission:   Labs on Admission: I have personally reviewed following labs and imaging studies  CBC: Recent Labs  Lab 10/26/17 1213 10/27/17 0611 10/31/17 2115  WBC 14.2* 10.5 11.6*  HGB 11.3* 11.5* 9.9*  HCT 35.6* 36.3 30.4*  MCV 88.1 87.1 88.9  PLT 283 176 290   Basic Metabolic Panel: Recent Labs  Lab 10/26/17 1213 10/27/17 0611 10/31/17 2115  NA 133* 135 139  K 4.7 4.4  4.6  CL 99* 105 107  CO2 24 19* 23  GLUCOSE 165* 95 174*  BUN 21* 13 43*  CREATININE 1.04* 0.83 1.07*  CALCIUM 9.9 9.2 9.8   GFR: Estimated Creatinine Clearance: 29.5 mL/min (A) (by C-G formula based on SCr of 1.07 mg/dL (H)). Liver Function Tests: Recent Labs  Lab 10/26/17 1213 10/27/17 0611 10/31/17 2115  AST 23 20 37  ALT 14 10* 21  ALKPHOS 119 93 110  BILITOT 0.4 0.4 0.4  PROT 7.6 6.9 7.1  ALBUMIN 2.5* 2.1* 1.9*   No results for input(s):  LIPASE, AMYLASE in the last 168 hours. Recent Labs  Lab 10/31/17 2218  AMMONIA 53*   Coagulation Profile: No results for input(s): INR, PROTIME in the last 168 hours. Cardiac Enzymes: No results for input(s): CKTOTAL, CKMB, CKMBINDEX, TROPONINI in the last 168 hours. BNP (last 3 results) No results for input(s): PROBNP in the last 8760 hours. HbA1C: No results for input(s): HGBA1C in the last 72 hours. CBG: Recent Labs  Lab 10/28/17 1621 10/28/17 2000 10/29/17 0006 10/29/17 0427 10/29/17 0828  GLUCAP 212* 222* 257* 155* 90   Lipid Profile: No results for input(s): CHOL, HDL, LDLCALC, TRIG, CHOLHDL, LDLDIRECT in the last 72 hours. Thyroid Function Tests: No results for input(s): TSH, T4TOTAL, FREET4, T3FREE, THYROIDAB in the last 72 hours. Anemia Panel: No results for input(s): VITAMINB12, FOLATE, FERRITIN, TIBC, IRON, RETICCTPCT in the last 72 hours. Urine analysis:    Component Value Date/Time   COLORURINE YELLOW 10/31/2017 2120   APPEARANCEUR HAZY (A) 10/31/2017 2120   LABSPEC 1.018 10/31/2017 2120   PHURINE 5.0 10/31/2017 2120   GLUCOSEU 50 (A) 10/31/2017 2120   HGBUR NEGATIVE 10/31/2017 2120   BILIRUBINUR NEGATIVE 10/31/2017 2120   KETONESUR NEGATIVE 10/31/2017 2120   PROTEINUR NEGATIVE 10/31/2017 2120   UROBILINOGEN 0.2 10/23/2014 0830   NITRITE NEGATIVE 10/31/2017 2120   LEUKOCYTESUR LARGE (A) 10/31/2017 2120   Sepsis Labs: @LABRCNTIP (procalcitonin:4,lacticidven:4) ) Recent Results (from the past 240 hour(s))  Culture, blood (routine x 2)     Status: None   Collection Time: 10/26/17  1:55 PM  Result Value Ref Range Status   Specimen Description BLOOD LEFT ANTECUBITAL  Final   Special Requests   Final    BOTTLES DRAWN AEROBIC AND ANAEROBIC Blood Culture adequate volume   Culture   Final    NO GROWTH 5 DAYS Performed at [facility], [address] [place], [place] [address]    Report Status 10/31/2017 FINAL  Final  Culture, blood (routine x 2)      Status: Abnormal   Collection Time: 10/26/17  2:37 PM  Result Value Ref Range Status   Specimen Description BLOOD RIGHT HAND  Final   Special Requests IN PEDIATRIC BOTTLE Blood Culture adequate volume  Final   Culture  Setup Time   Final    GRAM POSITIVE COCCI IN PEDIATRIC BOTTLE CRITICAL RESULT CALLED TO, READ BACK BY AND VERIFIED WITH: Antoine PrimasE. Martin PharmD 12:05 10/27/17 ([email or web address])    Culture (A)  Final    STAPHYLOCOCCUS SPECIES (COAGULASE NEGATIVE) THE SIGNIFICANCE OF ISOLATING THIS ORGANISM FROM A SINGLE SET OF BLOOD CULTURES WHEN MULTIPLE SETS ARE DRAWN IS UNCERTAIN. PLEASE NOTIFY THE MICROBIOLOGY DEPARTMENT WITHIN ONE WEEK IF SPECIATION AND SENSITIVITIES ARE REQUIRED. Performed at [facility], [address] [place], [place] [address]    Report Status 10/29/2017 FINAL  Final  Blood Culture ID Panel (Reflexed)     Status: Abnormal   Collection Time:  10/26/17  2:37 PM  Result Value Ref Range Status   Enterococcus species NOT DETECTED NOT DETECTED Final   Listeria monocytogenes NOT DETECTED NOT DETECTED Final   Staphylococcus species DETECTED (A) NOT DETECTED Final    Comment: Methicillin (oxacillin) susceptible coagulase negative staphylococcus. Possible blood culture contaminant (unless isolated from more than one blood culture draw or clinical case suggests pathogenicity). No antibiotic treatment is indicated for blood  culture contaminants. CRITICAL RESULT CALLED TO, READ BACK BY AND VERIFIED WITH: Antoine Primas PharmD 12:05 10/27/17 ([email or web address])    Staphylococcus aureus NOT DETECTED NOT DETECTED Final   Methicillin resistance NOT DETECTED NOT DETECTED Final   Streptococcus species NOT DETECTED NOT DETECTED Final   Streptococcus agalactiae NOT DETECTED NOT DETECTED Final   Streptococcus pneumoniae NOT DETECTED NOT DETECTED Final   Streptococcus pyogenes NOT DETECTED NOT DETECTED Final   Acinetobacter baumannii NOT DETECTED NOT DETECTED Final   Enterobacteriaceae species NOT  DETECTED NOT DETECTED Final   Enterobacter cloacae complex NOT DETECTED NOT DETECTED Final   Escherichia coli NOT DETECTED NOT DETECTED Final   Klebsiella oxytoca NOT DETECTED NOT DETECTED Final   Klebsiella pneumoniae NOT DETECTED NOT DETECTED Final   Proteus species NOT DETECTED NOT DETECTED Final   Serratia marcescens NOT DETECTED NOT DETECTED Final   Haemophilus influenzae NOT DETECTED NOT DETECTED Final   Neisseria meningitidis NOT DETECTED NOT DETECTED Final   Pseudomonas aeruginosa NOT DETECTED NOT DETECTED Final   Candida albicans NOT DETECTED NOT DETECTED Final   Candida glabrata NOT DETECTED NOT DETECTED Final   Candida krusei NOT DETECTED NOT DETECTED Final   Candida parapsilosis NOT DETECTED NOT DETECTED Final   Candida tropicalis NOT DETECTED NOT DETECTED Final      UA persistent elevated white blood cell count and urine   Lab Results  Component Value Date   HGBA1C 7.1 (H) 05/12/2017    Estimated Creatinine Clearance: 29.5 mL/min (A) (by C-G formula based on SCr of 1.07 mg/dL (H)).  BNP (last 3 results) No results for input(s): PROBNP in the last 8760 hours.   ECG REPORT Not obtained  [facility] Weights   10/31/17 2105  Weight: 67.1 kg (148 lb)     Cultures:    Component Value Date/Time   SDES BLOOD RIGHT HAND 10/26/2017 1437   SPECREQUEST IN PEDIATRIC BOTTLE Blood Culture adequate volume 10/26/2017 1437   CULT (A) 10/26/2017 1437    STAPHYLOCOCCUS SPECIES (COAGULASE NEGATIVE) THE SIGNIFICANCE OF ISOLATING THIS ORGANISM FROM A SINGLE SET OF BLOOD CULTURES WHEN MULTIPLE SETS ARE DRAWN IS UNCERTAIN. PLEASE NOTIFY THE MICROBIOLOGY DEPARTMENT WITHIN ONE WEEK IF SPECIATION AND SENSITIVITIES ARE REQUIRED. Performed at [facility], [address], [place], [place] [address]    REPTSTATUS 10/29/2017 FINAL 10/26/2017 1437     Radiological Exams on Admission: Dg Chest 1 View  Result Date: 10/31/2017 CLINICAL DATA:  84 year old female with altered mental  status. EXAM: CHEST 1 VIEW COMPARISON:  Chest radiograph dated 10/26/2017 FINDINGS: There is shallow inspiration with bibasilar atelectasis/scarring. An area of increased density in the right upper lung field may be chronic or represent developing infiltrate. Clinical correlation is recommended. There is no large pleural effusion. No pneumothorax. Stable cardiac silhouette. Atherosclerotic calcification of the aortic arch. Osteopenia with degenerative changes of the spine. No acute osseous pathology. IMPRESSION: Chronic changes and interstitial coarsening versus developing infiltrate in the right upper lung field. Clinical correlation is recommended. Electronically Signed   By: Elgie Collard M.D.   On: 10/31/2017 23:19  Ct Head Wo Contrast  Result Date: 10/31/2017 CLINICAL DATA:  84 y/o  F; evaluation of altered mental status. EXAM: CT HEAD WITHOUT CONTRAST TECHNIQUE: Contiguous axial images were obtained from the base of the skull through the vertex without intravenous contrast. COMPARISON:  10/26/2017 CT head FINDINGS: Brain: No evidence of acute infarction, hemorrhage, hydrocephalus, extra-axial collection or mass lesion/mass effect. Stable advanced chronic microvascular ischemic changes, diffuse parenchymal volume loss of the brain, and basal ganglia chronic lacunar infarctions. Vascular: Calcific atherosclerosis of carotid siphons and vertebral arteries. No hyperdense vessel identified. Skull: Normal. Negative for fracture or focal lesion. Sinuses/Orbits: Partial right mastoid effusion. Bilateral intra-ocular lens replacement. Otherwise negative. Other: None. IMPRESSION: 1. No acute intracranial abnormality identified. 2. Stable advanced chronic microvascular ischemic changes, parenchymal volume loss, and basal ganglia lacunar infarcts. Electronically Signed   By: Mitzi Hansen M.D.   On: 10/31/2017 23:05    Chart being mildly confused been reviewed    Assessment/Plan  84 y.o. female  with medical history significant of Hyperlipidemia, Dm2, CVA, seizure do, dementia, history of CVA, decubitus ulcers   Admitted for acute encephalopathy in the setting of recurrent dehydration possible recurrent infection   Present on Admission: . Acute metabolic encephalopathy -   - most likely multifactorial secondary to combination of  infection  mild dehydration secondary to decreased by mouth intake,   - Will rehydrate   - treat underlining infection    - if no improvement may need further evaluation with EEG given hx of partial seizures.   . Dehydration - will rehydrate . Diabetes mellitus type II, uncontrolled (HCC) -  - Order Sensitive SSI   - continue home insulin regimen    -  check TSH and HgA1C     . HTN (hypertension) - stable  . UTI (urinary tract infection) -persistent pyuria but only rare bacteria unclear if undertreated.  Will await results of urine culture . Vascular dementia chronic at baseline patient able to verbalize but has been mildly confused . Unstageable pressure ulcer of sacral region [facility]) -wound care consult ordered, family denies foul order states it has been improving . Right upper lobe pneumonia (HCC) persistent unsure if chest x-ray is lagging with given persistent low-grade fevers will readmit and broaden antibiotic coverage for possible aspiration pneumonia with Zosyn. If no improvement despite completion of antibiotic course would benefit from additional imaging to further evaluate . Protein-calorie malnutrition, severe (HCC) check prealbumin ordered a nutritional consult . Anemia obtain anemia panel in a.m. . Increased ammonia level no history of liver disease no asterixis present.  Repeat in a.m. once rehydrated unclear significance  Seizure disorder Depakote subtherapeutic.  Patient is arousable and answers questions doubt status epilepticus.  Unsure if Depakote subtherapeutic was patient was not able to tolerate medications due to decreased p.o.   Restart If prolonged altered mental status would obtain EEG neurology consult  Would recheck level and  Dosage adjustment as needed  constipation obtain KUB to reevaluate if evidence of obstipation given elevated ammonia could use lactulose Obstipation may have been contributing to lack of appetite and recurrent dehydration.  Dysphasia patient has been evaluated during prior admission and was started on dysphagia 1 diet patient may be at risk of aspiration with decrease sensorium.  Currently improving after rehydration  History of CVA continue Plavix residual right-sided deficits Other plan as per orders.  DVT prophylaxis:  SCD   Code Status:   DNR/DNI   as per  family   Family Communication:  Family  at  Bedside  plan of care was discussed with  Daughters,   Disposition Plan:    To home once workup is complete and patient is stable          consult PT/OT eval prior to DC ordered                        Nutrition consulted                          Consults called:none  Admission status:   inpatient      Level of care     medical floor            I have spent a total of 56 min on this admission   Leonel Mccollum 11/01/2017, 2:29 AM  [facility]  Pager [phone]   after 2 AM please page floor coverage PA If 7AM-7PM, please contact the day team taking care of the patient  [email or web address]  Password [email or web address]

## 2017-11-01 NOTE — Consult Note (Signed)
WOC Nurse wound consult note Reason for Consult: Recently reepithelialized areas on buttocks (no return of pigment at this time); scattered pinpoint areas of tissue loss from friction and moisture remain. Patient incontinence of urine, has PurWick device (external powered female urinary incontinence device) in place and attached to suction. Chronic, nonhealing ulceration (full thickness) to left heel, loose toenail on the RGT that is easily removed without pain, bleeding or trauma by this Clinical research associatewriter. Wound type: MASD, specifically IAD with recent healing of bilateral buttock ulcerations. Pressure to left heel. Pressure Injury POA: Yes Measurement: Left Heel Unstageable PrI:  2cm x 1.8cm with depth unable to be determined due to the presence of black soft (dissolving) eschar in center. Macerated periphery with no drainage. Bilateral Buttock wounds (recently reepithelialized): 5cm x 3.5cm with no depth, dry, pink in color due to absence of pigment  Wound bed:As described above Drainage (amount, consistency, odor) None Periwound: Intact, dry. Dressing procedure/placement/frequency: Using briefs in ED.  Please remove briefs when transferred to floor and implement a mattress replacement with low air loss feature. Turn side to side, minimizing time spent in the supine position. Float heels. Using powered female urinary incontinence collection device (PurWick). Topical care Orders for left heel and buttocks are provided for Nursing using xeroform gauze to the heel and moisture barrier ointment to the buttocks.  WOC nursing team will not follow, but will remain available to this patient, the nursing and medical teams.  Please re-consult if needed. Thanks, Ladona MowLaurie Kashlynn Kundert, MSN, RN, GNP, Hans EdenCWOCN, CWON-AP, FAAN  Pager# [phone]

## 2017-11-01 NOTE — Progress Notes (Signed)
Initial Nutrition Assessment  DOCUMENTATION CODES:   Not applicable  INTERVENTION:   -MVI daily -30 ml Prostat BID, each supplement provides 100 kcals and 15 grams protein -Premier Protein BID, each supplement provides 160 kcals and 30 grams protein  NUTRITION DIAGNOSIS:   Increased nutrient needs related to wound healing as evidenced by estimated needs.  GOAL:   Patient will meet greater than or equal to 90% of their needs  MONITOR:   PO intake, Supplement acceptance, Diet advancement, Labs, Weight trends, Skin, I & O's  REASON FOR ASSESSMENT:   Consult Assessment of nutrition requirement/status  ASSESSMENT:   Megan [name] is a 84 y.o. female with medical history significant of Hyperlipidemia, Dm2, CVA, seizure do, dementia, history of CVA 2013, decubitus ulcers. Admitted with decreased responsiveness, decreased alterness, and worsening PO intake.   Pt admitted with decreased oral intake, decreased alertness, and worsening PO intake.   Pt very lethargic at time of visit. Spoke with pt daughter at time of visit, who reports pt typically with a very hearty appetite, consuming 3 meals per day (pt often consumes a mechanical soft diet, for ease of intake). However, intake has been minimal (bites and sips) over the past 3-4 days, related to altered mentation. Pt also consume Prostat and Premier Protein supplements at home, which she accepts well. Pt consumed about 50% of Premier Protein supplement prior to visit; RN also confirms good acceptance of supplements.   Pt daughter denies any weight loss; noted wt hx reveals no wt loss over the past 5 years.   Pt with multiple wounds; discussed importance of good meal and supplement intake to promote healing. Pt daughter request continuance of home supplements.   Labs reviewed: CBGS: 154-203 (inpatient orders for glycemic control are 0-9 units insulin aspart every 4 hours, 18 units insulin glargine q HS).   NUTRITION - FOCUSED PHYSICAL  EXAM:    Most Recent Value  Orbital Region  No depletion  Upper Arm Region  No depletion  Thoracic and Lumbar Region  No depletion  Buccal Region  No depletion  Temple Region  No depletion  Clavicle Bone Region  No depletion  Clavicle and Acromion Bone Region  No depletion  Scapular Bone Region  No depletion  Dorsal Hand  No depletion  Patellar Region  No depletion  Anterior Thigh Region  No depletion  Posterior Calf Region  No depletion  Edema (RD Assessment)  Mild  Hair  Reviewed  Eyes  Reviewed  Mouth  Reviewed  Skin  Reviewed  Nails  Reviewed       Diet Order:  DIET - DYS 1 Room service appropriate? Yes with Assist; Fluid consistency: Thin  EDUCATION NEEDS:   Education needs have been addressed  Skin:  Skin Assessment: Skin Integrity Issues: Skin Integrity Issues:: Stage II Stage II: buttocks Stage III: buttocks Unstageable: lt heel Other: MASD to buttocks  Last BM:  PTA  Height:   Ht Readings from Last 1 Encounters:  10/31/17 5\' 2"  (1.575 m)    Weight:   Wt Readings from Last 1 Encounters:  10/31/17 148 lb (67.1 kg)    Ideal Body Weight:  50 kg  BMI:  Body mass index is 27.07 kg/m.  Estimated Nutritional Needs:   Kcal:  1650-1850  Protein:  85-100 grams  Fluid:  > 1.6 L    Tacara Hadlock A. Mayford KnifeWilliams, RD, LDN, CDE Pager: [phone] After hours Pager: [phone]

## 2017-11-01 NOTE — Progress Notes (Addendum)
PROGRESS NOTE  Megan [name] ZOX:[id number] DOB: Jan 06, 1922 DOA: 10/31/2017 PCP: [name], MD  HPI/Recap of past 24 hours:  Megan [name] is a 84 y.o. year old female with medical history significant for hyperlipidemia, type 2 diabetes, seizure, dementia, history of CVA in 2013, and decubitus ulcers who presented on 10/31/2017 with worsening confusion and decreased responsiveness and was found to have a urinary tract infection and possible right upper lobe pneumonia.  Interval History Patient continued to be minimally responsive though stable.  Family member at bedside.  Patient has been nonverbal for the past few days.  Assessment/Plan: Active Problems:   Right upper lobe pneumonia (HCC)   Diabetes mellitus type II, uncontrolled (HCC)   HTN (hypertension)   History of partial seizures   History of CVA w/residual right side deficits   Protein-calorie malnutrition, severe (HCC)   Anemia   Vascular dementia   Dehydration   Unstageable pressure ulcer of sacral region [facility])   UTI (urinary tract infection)   Increased ammonia level   Acute metabolic encephalopathy   HCAP (healthcare-associated pneumonia)   #Altered mental status, improving Patient presented with altered mental status likely in the setting of urinary tract infection and right upper lobe pneumonia.  Patient was recently discharged for the symptoms and was improving prior to discharge.  Patient also has a history of seizure and  is on Depakote with subtherapeutic level.  Altered mental status could also be secondary to possible undetected seizure activity.  Patient is currently on vancomycin and Zosyn.  No electrolytes abnormalities.  Blood glucose in adequate range.  Troponins have been negative.  Thyroid function is within normal limits.  Head CT with no acute findings.  Mildly elevated lactic acid 1.99.  Mildly elevated ammonia 53. Renal function is within normal limits.  --Follow-up on RVP, urine culture, MRSA PCR, sputum  culture --Continue vancomycin and Zosyn --Follow-up on lactate --Lactulose 20 mg twice daily --Continue Namenda --Monitor vitals --Could increase valproic acid dosage by 25% per neurology --Could consider EEG if mental status is waxing and waning per Neuro  #Urinary tract infection, subacute Patient was recently treated in the hospital for UTI and discharged home on Vantin.  No cultures were obtained during previous admission.  This admission UA shows unresolved UTI with large leukocytes and negative nitrite. --We will follow-up on urine cultures --Continue ceftriaxone, patient also on vancomycin and Zosyn  #Right upper lobe pneumonia, subacute  Patient was recently treated for similar symptoms and was discharged from cephalosporin and azithromycin.  Mild lactic acidosis on admission.  Chest x-ray on admission consistent with right upper lobe infiltrate.  Initial concern for aspiration pneumonia given issues with dysphasia.  Still a possibility given the patient is always recumbent aspiration will affect right upper lobe.  Will check pro-calcitonin strep pneumo antigen --Continue vancomycin and Zosyn we will de-escalate after MRSA PCR --Oxygen therapy as needed --Follow-up on strep pneumo antigen and pro-calcitonin  #Dehydration, acute Patient received IV fluid in the ED, she presented with lactic acidosis of 1.99.  Renal function was mildly elevated from discharge.  No evidence of diarrhea or emesis.  This morning blood pressure was 76/65.  Patient was bolused with 500 mL of normal saline  #Type 2 diabetes, uncontrolled With glucose on admission was 174.  A1c 8.6. --Continue sensitive sliding scale insulin --Continue Lantus 18 units nightly --CBG every 4  #Hypertension, stable With admission patient's blood pressure was 130/64.  Has blood pressure overnight was 159/48.  This morning patient was hypotensive  with lowest blood pressure 76/65.  Concerning for possible sepsis given UTI and  right upper lobe pneumonia.  Patient was bolused 500 mm normal saline and responded well.  Currently blood pressure is 112/51. --Will continue to monitor  #Anemia Hemoglobin is 9.9 down from 11.5 on discharge (10/27/2017).  Baseline appears to be on 10.5-11.5.  Iron panel was not consistent for iron deficiency anemia.  No obvious signs of bleeding.  Could be dilutional. --We will continue to monitor --Follow-up on a.m. CBC   #Seizures Patient has a history of seizures.  Patient has been on Depakote.  Patient is also subtherapeutic.  Discussion with neurology recommendation to increase dosage by 25%.  Could consider EEG if mental status do not improve in the next few hours. --Continue Depakote 500 mg nightly  #Protein calorie malnutrition --Continue supplementation  #Decubitus pressure ulcer Wound care consulted, follow-up on recommendations.  #Constipation Some evidence of moderate stool burden.  Patient is currently on Dulcolax.  Given altered mental status with admission as well as elevated ammonia level, will prescribe lactulose. --Continue to monitor  Code Status: DNR  Family Communication: Daughter at bedside  Disposition Plan: Home pending clinical improvement   Consultants:  None  Procedures:  None  Antimicrobials:  Ceftriaxone  Vancomycin  Zosyn  Cultures:  Urine  Telemetry: No  DVT prophylaxis: Currently on Plavix   Objective: Vitals:   11/01/17 0404 11/01/17 0806 11/01/17 0902 11/01/17 0906  BP: (!) 159/48 (!) 104/51 (!) 84/47 (!) 76/65  Pulse: 91 80 73   Resp:  16 14   Temp: 99.3 F (37.4 C) 98.5 F (36.9 C) 98.4 F (36.9 C)   TempSrc: Axillary Oral Axillary   SpO2: 92% 93% 95%   Weight:      Height:        Intake/Output Summary (Last 24 hours) at 11/01/2017 1157 Last data filed at 11/01/2017 0833 Gross per 24 hour  Intake 2500 ml  Output -  Net 2500 ml   Filed Weights   10/31/17 2105  Weight: 148 lb (67.1 kg)     Exam:  General: Minimally responsive, sleepy and tired appearing.  Lying in bed,no apparent distres Eyes: EOMI, anicteric ENT: Oral Mucosa clear and moist Neck: normal, no thyromegaly Cardiovascular: regular rate and rhythm, no murmurs, rubs or gallops, no JVD or edema Respiratory: Normal respiratory effort on room air, lungs clear to auscultation bilaterally Abdomen: soft, non-distended, non-tender, normal bowel sounds Skin: No Rash Musculoskeletal:Good ROM, no contractures. Normal muscle tone Neurologic: Grossly no focal neuro deficit.Mental status AAOx3 Psychiatric:Appropriate affect, and mood  Data Reviewed: CBC: Recent Labs  Lab 10/26/17 1213 10/27/17 0611 10/31/17 2115  WBC 14.2* 10.5 11.6*  HGB 11.3* 11.5* 9.9*  HCT 35.6* 36.3 30.4*  MCV 88.1 87.1 88.9  PLT 283 176 290   Basic Metabolic Panel: Recent Labs  Lab 10/26/17 1213 10/27/17 0611 10/31/17 2115 11/01/17 0243 11/01/17 0830  NA 133* 135 139 140  --   K 4.7 4.4 4.6 4.7  --   CL 99* 105 107 107  --   CO2 24 19* 23 23  --   GLUCOSE 165* 95 174* 186*  --   BUN 21* 13 43* 36*  --   CREATININE 1.04* 0.83 1.07* 0.87  --   CALCIUM 9.9 9.2 9.8 9.5  --   MG  --   --   --   --  2.2  PHOS  --   --   --   --  2.8  GFR: Estimated Creatinine Clearance: 36.3 mL/min (by C-G formula based on SCr of 0.87 mg/dL). Liver Function Tests: Recent Labs  Lab 10/26/17 1213 10/27/17 0611 10/31/17 2115 11/01/17 0243  AST 23 20 37 35  ALT 14 10* 21 20  ALKPHOS 119 93 110 110  BILITOT 0.4 0.4 0.4 0.5  PROT 7.6 6.9 7.1 7.1  ALBUMIN 2.5* 2.1* 1.9* 2.0*   No results for input(s): LIPASE, AMYLASE in the last 168 hours. Recent Labs  Lab 10/31/17 2218 11/01/17 0242  AMMONIA 53* 33   Coagulation Profile: Recent Labs  Lab 11/01/17 0041  INR 1.08   Cardiac Enzymes: Recent Labs  Lab 11/01/17 0041 11/01/17 0830  CKTOTAL 126  --   TROPONINI <0.03 <0.03   BNP (last 3 results) No results for input(s): PROBNP  in the last 8760 hours. HbA1C: Recent Labs    11/01/17 0243  HGBA1C 8.6*   CBG: Recent Labs  Lab 10/29/17 0427 10/29/17 0828 11/01/17 0241 11/01/17 0337 11/01/17 0822  GLUCAP 155* 90 170* 169* 174*   Lipid Profile: No results for input(s): CHOL, HDL, LDLCALC, TRIG, CHOLHDL, LDLDIRECT in the last 72 hours. Thyroid Function Tests: Recent Labs    11/01/17 0243  TSH 1.151   Anemia Panel: Recent Labs    11/01/17 0243 11/01/17 0830  VITAMINB12  --  788  FOLATE 18.9  --   FERRITIN  --  71  TIBC  --  217*  IRON  --  28  RETICCTPCT  --  1.8   Urine analysis:    Component Value Date/Time   COLORURINE YELLOW 10/31/2017 2120   APPEARANCEUR HAZY (A) 10/31/2017 2120   LABSPEC 1.018 10/31/2017 2120   PHURINE 5.0 10/31/2017 2120   GLUCOSEU 50 (A) 10/31/2017 2120   HGBUR NEGATIVE 10/31/2017 2120   BILIRUBINUR NEGATIVE 10/31/2017 2120   KETONESUR NEGATIVE 10/31/2017 2120   PROTEINUR NEGATIVE 10/31/2017 2120   UROBILINOGEN 0.2 10/23/2014 0830   NITRITE NEGATIVE 10/31/2017 2120   LEUKOCYTESUR LARGE (A) 10/31/2017 2120   Sepsis Labs: @LABRCNTIP (procalcitonin:4,lacticidven:4)  ) Recent Results (from the past 240 hour(s))  Culture, blood (routine x 2)     Status: None   Collection Time: 10/26/17  1:55 PM  Result Value Ref Range Status   Specimen Description BLOOD LEFT ANTECUBITAL  Final   Special Requests   Final    BOTTLES DRAWN AEROBIC AND ANAEROBIC Blood Culture adequate volume   Culture   Final    NO GROWTH 5 DAYS Performed at [facility], [address], [place], [place] [address]    Report Status 10/31/2017 FINAL  Final  Culture, blood (routine x 2)     Status: Abnormal   Collection Time: 10/26/17  2:37 PM  Result Value Ref Range Status   Specimen Description BLOOD RIGHT HAND  Final   Special Requests IN PEDIATRIC BOTTLE Blood Culture adequate volume  Final   Culture  Setup Time   Final    GRAM POSITIVE COCCI IN PEDIATRIC BOTTLE CRITICAL RESULT  CALLED TO, READ BACK BY AND VERIFIED WITH: Antoine Primas PharmD 12:05 10/27/17 ([email or web address])    Culture (A)  Final    STAPHYLOCOCCUS SPECIES (COAGULASE NEGATIVE) THE SIGNIFICANCE OF ISOLATING THIS ORGANISM FROM A SINGLE SET OF BLOOD CULTURES WHEN MULTIPLE SETS ARE DRAWN IS UNCERTAIN. PLEASE NOTIFY THE MICROBIOLOGY DEPARTMENT WITHIN ONE WEEK IF SPECIATION AND SENSITIVITIES ARE REQUIRED. Performed at [facility], [address], [place], [place] [address]    Report Status 10/29/2017 FINAL  Final  Blood Culture ID Panel (Reflexed)     Status: Abnormal   Collection Time: 10/26/17  2:37 PM  Result Value Ref Range Status   Enterococcus species NOT DETECTED NOT DETECTED Final   Listeria monocytogenes NOT DETECTED NOT DETECTED Final   Staphylococcus species DETECTED (A) NOT DETECTED Final    Comment: Methicillin (oxacillin) susceptible coagulase negative staphylococcus. Possible blood culture contaminant (unless isolated from more than one blood culture draw or clinical case suggests pathogenicity). No antibiotic treatment is indicated for blood  culture contaminants. CRITICAL RESULT CALLED TO, READ BACK BY AND VERIFIED WITH: Antoine Primas PharmD 12:05 10/27/17 ([email or web address])    Staphylococcus aureus NOT DETECTED NOT DETECTED Final   Methicillin resistance NOT DETECTED NOT DETECTED Final   Streptococcus species NOT DETECTED NOT DETECTED Final   Streptococcus agalactiae NOT DETECTED NOT DETECTED Final   Streptococcus pneumoniae NOT DETECTED NOT DETECTED Final   Streptococcus pyogenes NOT DETECTED NOT DETECTED Final   Acinetobacter baumannii NOT DETECTED NOT DETECTED Final   Enterobacteriaceae species NOT DETECTED NOT DETECTED Final   Enterobacter cloacae complex NOT DETECTED NOT DETECTED Final   Escherichia coli NOT DETECTED NOT DETECTED Final   Klebsiella oxytoca NOT DETECTED NOT DETECTED Final   Klebsiella pneumoniae NOT DETECTED NOT DETECTED Final   Proteus species NOT DETECTED NOT DETECTED Final    Serratia marcescens NOT DETECTED NOT DETECTED Final   Haemophilus influenzae NOT DETECTED NOT DETECTED Final   Neisseria meningitidis NOT DETECTED NOT DETECTED Final   Pseudomonas aeruginosa NOT DETECTED NOT DETECTED Final   Candida albicans NOT DETECTED NOT DETECTED Final   Candida glabrata NOT DETECTED NOT DETECTED Final   Candida krusei NOT DETECTED NOT DETECTED Final   Candida parapsilosis NOT DETECTED NOT DETECTED Final   Candida tropicalis NOT DETECTED NOT DETECTED Final      Studies: Dg Chest 1 View  Result Date: 10/31/2017 CLINICAL DATA:  84 year old female with altered mental status. EXAM: CHEST 1 VIEW COMPARISON:  Chest radiograph dated 10/26/2017 FINDINGS: There is shallow inspiration with bibasilar atelectasis/scarring. An area of increased density in the right upper lung field may be chronic or represent developing infiltrate. Clinical correlation is recommended. There is no large pleural effusion. No pneumothorax. Stable cardiac silhouette. Atherosclerotic calcification of the aortic arch. Osteopenia with degenerative changes of the spine. No acute osseous pathology. IMPRESSION: Chronic changes and interstitial coarsening versus developing infiltrate in the right upper lung field. Clinical correlation is recommended. Electronically Signed   By: Elgie Collard M.D.   On: 10/31/2017 23:19   Dg Abd 1 View  Result Date: 11/01/2017 CLINICAL DATA:  84 y/o F; lethargic and not verbal. Presenting with altered mental status. Immediate 10/26/2017 for pneumonia. History of CVA with right-sided weakness. EXAM: ABDOMEN - 1 VIEW COMPARISON:  10/31/2017 chest radiograph. FINDINGS: Normal bowel gas pattern. Moderate volume of stool throughout the colon. Vascular calcifications. Bones are demineralized and there are multilevel degenerative changes of the spine. Coarse interstitial opacities in the lung bases better characterized on recent chest radiograph. IMPRESSION: Normal bowel gas pattern.  Moderate volume of stool throughout the colon. Electronically Signed   By: Mitzi Hansen M.D.   On: 11/01/2017 02:26   Ct Head Wo Contrast  Result Date: 10/31/2017 CLINICAL DATA:  84 y/o  F; evaluation of altered mental status. EXAM: CT HEAD WITHOUT CONTRAST TECHNIQUE: Contiguous axial images were obtained from the base of the skull through the vertex without intravenous contrast. COMPARISON:  10/26/2017 CT head FINDINGS: Brain: No evidence of acute infarction, hemorrhage,  hydrocephalus, extra-axial collection or mass lesion/mass effect. Stable advanced chronic microvascular ischemic changes, diffuse parenchymal volume loss of the brain, and basal ganglia chronic lacunar infarctions. Vascular: Calcific atherosclerosis of carotid siphons and vertebral arteries. No hyperdense vessel identified. Skull: Normal. Negative for fracture or focal lesion. Sinuses/Orbits: Partial right mastoid effusion. Bilateral intra-ocular lens replacement. Otherwise negative. Other: None. IMPRESSION: 1. No acute intracranial abnormality identified. 2. Stable advanced chronic microvascular ischemic changes, parenchymal volume loss, and basal ganglia lacunar infarcts. Electronically Signed   By: Mitzi HansenLance  Furusawa-Stratton M.D.   On: 10/31/2017 23:05    Scheduled Meds: . clopidogrel  75 mg Oral Q breakfast  . divalproex  500 mg Oral QHS  . ferrous sulfate  325 mg Oral Q breakfast  . insulin aspart  0-9 Units Subcutaneous Q4H  . insulin glargine  18 Units Subcutaneous QPM  . lactulose  20 g Oral BID  . memantine  14 mg Oral QHS  . protein supplement shake  2 oz Oral TID WC & HS  . [START ON 11/02/2017] rosuvastatin  5 mg Oral Q M,W,F    Continuous Infusions: . piperacillin-tazobactam (ZOSYN)  IV    . [START ON 11/02/2017] vancomycin       LOS: 0 days     Lovena NeighboursAbdoulaye Tanzania Basham, MD [facility] Pager [phone]  If 7PM-7AM, please contact night-coverage [email or web address] Password [email or web address] 11/01/2017, 11:57 AM

## 2017-11-01 NOTE — ED Triage Notes (Signed)
Family  Member at bed side reports Pt has been sleeping soundly since SAt.

## 2017-11-01 NOTE — ED Notes (Signed)
Breakfast tray ordered 

## 2017-11-02 DIAGNOSIS — R4182 Altered mental status, unspecified: Secondary | ICD-10-CM

## 2017-11-02 LAB — GLUCOSE, CAPILLARY
GLUCOSE-CAPILLARY: 188 mg/dL — AB (ref 65–99)
GLUCOSE-CAPILLARY: 199 mg/dL — AB (ref 65–99)
GLUCOSE-CAPILLARY: 233 mg/dL — AB (ref 65–99)
Glucose-Capillary: 149 mg/dL — ABNORMAL HIGH (ref 65–99)
Glucose-Capillary: 140 mg/dL — ABNORMAL HIGH (ref 65–99)
Glucose-Capillary: 199 mg/dL — ABNORMAL HIGH (ref 65–99)

## 2017-11-02 LAB — URINE CULTURE: CULTURE: NO GROWTH

## 2017-11-02 LAB — INFLUENZA PANEL BY PCR (TYPE A & B)
INFLAPCR: NEGATIVE
Influenza B By PCR: NEGATIVE

## 2017-11-02 LAB — PROCALCITONIN: PROCALCITONIN: 0.19 ng/mL

## 2017-11-02 MED ORDER — POLYETHYLENE GLYCOL 3350 17 GM/SCOOP PO POWD
1.0000 | Freq: Once | ORAL | Status: AC
  Administered 2017-11-02: 255 g via ORAL
  Filled 2017-11-02: qty 255

## 2017-11-02 MED ORDER — LACTULOSE 10 GM/15ML PO SOLN
30.0000 g | Freq: Two times a day (BID) | ORAL | Status: DC
  Administered 2017-11-03: 30 g via ORAL
  Filled 2017-11-02 (×3): qty 45

## 2017-11-02 NOTE — Progress Notes (Signed)
PROGRESS NOTE    Megan [name]  UEA:[id number] DOB: 07/16/1922 DOA: 10/31/2017 PCP: [name], MD  Brief Narrative84 y.o. year old female with medical history significant for hyperlipidemia, type 2 diabetes, seizure, dementia, history of CVA in 2013, and decubitus ulcers who presented on 10/31/2017 with worsening confusion and decreased responsiveness and was found to have a urinary tract infection and possible right upper lobe pneumonia. 11/02/2017 patient known to me from previous admissions.  At baseline she does not speak a lot.  Patient stays with her daughter at home.  She was awake when I saw her this morning.  She was not in any distress.  Daughter reported that she has not had a bowel movement for 3 days.  Denies any cough nausea vomiting fever or chills.   Assessment & Plan:   Active Problems:   Right upper lobe pneumonia (HCC)   Diabetes mellitus type II, uncontrolled (HCC)   HTN (hypertension)   History of partial seizures   History of CVA w/residual right side deficits   Protein-calorie malnutrition, severe (HCC)   Anemia   Vascular dementia   Dehydration   Unstageable pressure ulcer of sacral region [facility])   UTI (urinary tract infection)   Increased ammonia level   Acute metabolic encephalopathy   HCAP (healthcare-associated pneumonia)  #Altered mental status, improving Patient presented with altered mental status likely in the setting of urinary tract infection and right upper lobe pneumonia.  Patient was recently discharged for the symptoms and was improving prior to discharge.  Patient also has a history of seizure and  is on Depakote with subtherapeutic level.  Altered mental status could also be secondary to possible undetected seizure activity.  Patient is currently on vancomycin and Zosyn.  No electrolytes abnormalities.  Blood glucose in adequate range.  Troponins have been negative.  Thyroid function is within normal limits.  Head CT with no acute findings.  Mildly  elevated lactic acid 1.99.  Mildly elevated ammonia 53. Renal function is within normal limits.  --Follow-up on RVP, urine culture, MRSA PCR, sputum culture --Continue vancomycin and Zosyn.  Narrow antibiotics once cultures back. --Lactulose 30 mg twice daily --Continue Namenda --Monitor vitals --Could increase valproic acid dosage by 25% per neurology --Could consider EEG if mental status is waxing and waning per Neuro  #Urinary tract infection, subacute Patient was recently treated in the hospital for UTI and discharged home on Vantin.  No cultures were obtained during previous admission.  This admission UA shows unresolved UTI with large leukocytes and negative nitrite. --We will follow-up on urine cultures --Continue ceftriaxone, patient also on vancomycin and Zosyn  Right upper lobe pneumonia, subacute  Patient was recently treated for similar symptoms and was discharged from cephalosporin and azithromycin.  Mild lactic acidosis on admission.  Chest x-ray on admission consistent with right upper lobe infiltrate.  Initial concern for aspiration pneumonia given issues with dysphasia.  Still a possibility given the patient is always recumbent aspiration will affect right upper lobe.  Coag negative staph detected in blood culture.  Await final identification and sensitivity. --Continue vancomycin and Zosyn we will de-escalate after MRSA PCR --Oxygen therapy as needed --Follow-up on strep pneumo antigen and pro-calcitonin  #Dehydration, acute Patient received IV fluid in the ED, she presented with lactic acidosis of 1.99.  Renal function was mildly elevated from discharge.  No evidence of diarrhea or emesis.  This morning blood pressure was 76/65.  Patient was bolused with 500 mL of normal saline  #Type 2 diabetes,  uncontrolled With glucose on admission was 174.  A1c 8.6. --Continue sensitive sliding scale insulin --Continue Lantus 18 units nightly --CBG every 4  Hypertension,  stable With admission patient's blood pressure was 130/64.  Has blood pressure overnight was 159/48.  This morning patient was hypotensive with lowest blood pressure 76/65.  Concerning for possible sepsis given UTI and right upper lobe pneumonia.  Patient was bolused 500 mm normal saline and responded well.  Currently blood pressure is 112/51. --Will continue to monitor  #Anemia Hemoglobin is 9.9 down from 11.5 on discharge (10/27/2017).  Baseline appears to be on 10.5-11.5.  Iron panel was not consistent for iron deficiency anemia.  No obvious signs of bleeding.  Could be dilutional. --We will continue to monitor --Follow-up on a.m. CBC   #Seizures Patient has a history of seizures.  Patient has been on Depakote.  Patient is also subtherapeutic.  Discussion with neurology recommendation to increase dosage by 25%.  Could consider EEG if mental status do not improve in the next few hours. --Continue Depakote 500 mg nightly  #Protein calorie malnutrition --Continue supplementation  #Decubitus pressure ulcer Wound care consulted, follow-up on recommendations.  #Constipation Some evidence of moderate stool burden.  Patient is currently on Dulcolax.  Given altered mental status with admission as well as elevated ammonia level, will prescribe lactulose.  Increase lactulose to 30 mg twice a day.  Add MiraLAX. --Continue to monitor        DVT prophylaxis: SCD code Status: DO NOT RESUSCITATE Family Communication: Discussed with daughter who was at bedside Disposition Plan to be determined Consultants: None  Procedures: None Antimicrobials Vanco and Zosyn Subjective: Patient resting in bed in no acute distress does not follow commands but her eyes are open does not talk much.  Objective: Vitals:   11/01/17 1100 11/01/17 1340 11/01/17 2046 11/02/17 0415  BP: (!) 112/51 104/66 132/75 138/71  Pulse:  70 78 78  Resp:  15 18 16   Temp:  98.4 F (36.9 C) 97.6 F (36.4 C) 98.2 F  (36.8 C)  TempSrc:  Axillary Oral   SpO2:  96% 94% 99%  Weight:      Height:        Intake/Output Summary (Last 24 hours) at 11/02/2017 0940 Last data filed at 11/02/2017 0420 Gross per 24 hour  Intake 50 ml  Output 850 ml  Net -800 ml   Filed Weights   10/31/17 2105  Weight: 67.1 kg (148 lb)    Examination:  General exam: Appears calm and comfortable  Respiratory system: Clear to auscultation. Respiratory effort normal. Cardiovascular system: S1 & S2 heard, RRR. No JVD, murmurs, rubs, gallops or clicks. No pedal edema. Gastrointestinal system: Abdomen is nondistended, soft and nontender. No organomegaly or masses felt. Normal bowel sounds heard. Central nervous system: Alert and oriented. No focal neurological deficits. Extremities: Symmetric 5 x 5 power. Skin: No rashes, lesions or ulcers Psychiatry: Judgement and insight appear normal. Mood & affect appropriate.     Data Reviewed: I have personally reviewed following labs and imaging studies  CBC: Recent Labs  Lab 10/26/17 1213 10/27/17 0611 10/31/17 2115  WBC 14.2* 10.5 11.6*  HGB 11.3* 11.5* 9.9*  HCT 35.6* 36.3 30.4*  MCV 88.1 87.1 88.9  PLT 283 176 290   Basic Metabolic Panel: Recent Labs  Lab 10/26/17 1213 10/27/17 0611 10/31/17 2115 11/01/17 0243 11/01/17 0830  NA 133* 135 139 140  --   K 4.7 4.4 4.6 4.7  --   CL 99*  105 107 107  --   CO2 24 19* 23 23  --   GLUCOSE 165* 95 174* 186*  --   BUN 21* 13 43* 36*  --   CREATININE 1.04* 0.83 1.07* 0.87  --   CALCIUM 9.9 9.2 9.8 9.5  --   MG  --   --   --   --  2.2  PHOS  --   --   --   --  2.8   GFR: Estimated Creatinine Clearance: 36.3 mL/min (by C-G formula based on SCr of 0.87 mg/dL). Liver Function Tests: Recent Labs  Lab 10/26/17 1213 10/27/17 0611 10/31/17 2115 11/01/17 0243  AST 23 20 37 35  ALT 14 10* 21 20  ALKPHOS 119 93 110 110  BILITOT 0.4 0.4 0.4 0.5  PROT 7.6 6.9 7.1 7.1  ALBUMIN 2.5* 2.1* 1.9* 2.0*   No results for  input(s): LIPASE, AMYLASE in the last 168 hours. Recent Labs  Lab 10/31/17 2218 11/01/17 0242  AMMONIA 53* 33   Coagulation Profile: Recent Labs  Lab 11/01/17 0041  INR 1.08   Cardiac Enzymes: Recent Labs  Lab 11/01/17 0041 11/01/17 0830 11/01/17 1212  CKTOTAL 126  --   --   TROPONINI <0.03 <0.03 <0.03   BNP (last 3 results) No results for input(s): PROBNP in the last 8760 hours. HbA1C: Recent Labs    11/01/17 0243  HGBA1C 8.6*   CBG: Recent Labs  Lab 11/01/17 1551 11/01/17 2022 11/02/17 0012 11/02/17 0414 11/02/17 0803  GLUCAP 154* 165* 188* 149* 140*   Lipid Profile: No results for input(s): CHOL, HDL, LDLCALC, TRIG, CHOLHDL, LDLDIRECT in the last 72 hours. Thyroid Function Tests: Recent Labs    11/01/17 0243  TSH 1.151   Anemia Panel: Recent Labs    11/01/17 0243 11/01/17 0830  VITAMINB12  --  788  FOLATE 18.9  --   FERRITIN  --  71  TIBC  --  217*  IRON  --  28  RETICCTPCT  --  1.8   Sepsis Labs: Recent Labs  Lab 10/26/17 1404 10/26/17 1642 10/31/17 2136 11/01/17 1036 11/01/17 1212 11/02/17 0443  PROCALCITON  --   --   --   --  0.24 0.19  LATICACIDVEN 2.13* 1.35 1.99* 1.9  --   --     Recent Results (from the past 240 hour(s))  Culture, blood (routine x 2)     Status: None   Collection Time: 10/26/17  1:55 PM  Result Value Ref Range Status   Specimen Description BLOOD LEFT ANTECUBITAL  Final   Special Requests   Final    BOTTLES DRAWN AEROBIC AND ANAEROBIC Blood Culture adequate volume   Culture   Final    NO GROWTH 5 DAYS Performed at [facility], [address] [place], [place] [address]    Report Status 10/31/2017 FINAL  Final  Culture, blood (routine x 2)     Status: Abnormal   Collection Time: 10/26/17  2:37 PM  Result Value Ref Range Status   Specimen Description BLOOD RIGHT HAND  Final   Special Requests IN PEDIATRIC BOTTLE Blood Culture adequate volume  Final   Culture  Setup Time   Final    GRAM POSITIVE  COCCI IN PEDIATRIC BOTTLE CRITICAL RESULT CALLED TO, READ BACK BY AND VERIFIED WITH: Antoine Primas PharmD 12:05 10/27/17 ([email or web address])    Culture (A)  Final    STAPHYLOCOCCUS SPECIES (COAGULASE NEGATIVE) THE SIGNIFICANCE OF ISOLATING THIS ORGANISM FROM A  SINGLE SET OF BLOOD CULTURES WHEN MULTIPLE SETS ARE DRAWN IS UNCERTAIN. PLEASE NOTIFY THE MICROBIOLOGY DEPARTMENT WITHIN ONE WEEK IF SPECIATION AND SENSITIVITIES ARE REQUIRED. Performed at [facility], [address] [place], [place] [address]    Report Status 10/29/2017 FINAL  Final  Blood Culture ID Panel (Reflexed)     Status: Abnormal   Collection Time: 10/26/17  2:37 PM  Result Value Ref Range Status   Enterococcus species NOT DETECTED NOT DETECTED Final   Listeria monocytogenes NOT DETECTED NOT DETECTED Final   Staphylococcus species DETECTED (A) NOT DETECTED Final    Comment: Methicillin (oxacillin) susceptible coagulase negative staphylococcus. Possible blood culture contaminant (unless isolated from more than one blood culture draw or clinical case suggests pathogenicity). No antibiotic treatment is indicated for blood  culture contaminants. CRITICAL RESULT CALLED TO, READ BACK BY AND VERIFIED WITH: Antoine PrimasE. Martin PharmD 12:05 10/27/17 ([email or web address])    Staphylococcus aureus NOT DETECTED NOT DETECTED Final   Methicillin resistance NOT DETECTED NOT DETECTED Final   Streptococcus species NOT DETECTED NOT DETECTED Final   Streptococcus agalactiae NOT DETECTED NOT DETECTED Final   Streptococcus pneumoniae NOT DETECTED NOT DETECTED Final   Streptococcus pyogenes NOT DETECTED NOT DETECTED Final   Acinetobacter baumannii NOT DETECTED NOT DETECTED Final   Enterobacteriaceae species NOT DETECTED NOT DETECTED Final   Enterobacter cloacae complex NOT DETECTED NOT DETECTED Final   Escherichia coli NOT DETECTED NOT DETECTED Final   Klebsiella oxytoca NOT DETECTED NOT DETECTED Final   Klebsiella pneumoniae NOT DETECTED NOT DETECTED Final   Proteus  species NOT DETECTED NOT DETECTED Final   Serratia marcescens NOT DETECTED NOT DETECTED Final   Haemophilus influenzae NOT DETECTED NOT DETECTED Final   Neisseria meningitidis NOT DETECTED NOT DETECTED Final   Pseudomonas aeruginosa NOT DETECTED NOT DETECTED Final   Candida albicans NOT DETECTED NOT DETECTED Final   Candida glabrata NOT DETECTED NOT DETECTED Final   Candida krusei NOT DETECTED NOT DETECTED Final   Candida parapsilosis NOT DETECTED NOT DETECTED Final   Candida tropicalis NOT DETECTED NOT DETECTED Final  Urine culture     Status: None   Collection Time: 10/31/17  9:20 PM  Result Value Ref Range Status   Specimen Description URINE, RANDOM  Final   Special Requests NONE  Final   Culture   Final    NO GROWTH Performed at [facility], [address] [place], [place] [address]    Report Status 11/02/2017 FINAL  Final         Radiology Studies: Dg Chest 1 View  Result Date: 10/31/2017 CLINICAL DATA:  84 year old female with altered mental status. EXAM: CHEST 1 VIEW COMPARISON:  Chest radiograph dated 10/26/2017 FINDINGS: There is shallow inspiration with bibasilar atelectasis/scarring. An area of increased density in the right upper lung field may be chronic or represent developing infiltrate. Clinical correlation is recommended. There is no large pleural effusion. No pneumothorax. Stable cardiac silhouette. Atherosclerotic calcification of the aortic arch. Osteopenia with degenerative changes of the spine. No acute osseous pathology. IMPRESSION: Chronic changes and interstitial coarsening versus developing infiltrate in the right upper lung field. Clinical correlation is recommended. Electronically Signed   By: Elgie CollardArash  Radparvar M.D.   On: 10/31/2017 23:19   Dg Abd 1 View  Result Date: 11/01/2017 CLINICAL DATA:  84 y/o F; lethargic and not verbal. Presenting with altered mental status. Immediate 10/26/2017 for pneumonia. History of CVA with right-sided weakness.  EXAM: ABDOMEN - 1 VIEW COMPARISON:  10/31/2017 chest  radiograph. FINDINGS: Normal bowel gas pattern. Moderate volume of stool throughout the colon. Vascular calcifications. Bones are demineralized and there are multilevel degenerative changes of the spine. Coarse interstitial opacities in the lung bases better characterized on recent chest radiograph. IMPRESSION: Normal bowel gas pattern. Moderate volume of stool throughout the colon. Electronically Signed   By: Mitzi Hansen M.D.   On: 11/01/2017 02:26   Ct Head Wo Contrast  Result Date: 10/31/2017 CLINICAL DATA:  84 y/o  F; evaluation of altered mental status. EXAM: CT HEAD WITHOUT CONTRAST TECHNIQUE: Contiguous axial images were obtained from the base of the skull through the vertex without intravenous contrast. COMPARISON:  10/26/2017 CT head FINDINGS: Brain: No evidence of acute infarction, hemorrhage, hydrocephalus, extra-axial collection or mass lesion/mass effect. Stable advanced chronic microvascular ischemic changes, diffuse parenchymal volume loss of the brain, and basal ganglia chronic lacunar infarctions. Vascular: Calcific atherosclerosis of carotid siphons and vertebral arteries. No hyperdense vessel identified. Skull: Normal. Negative for fracture or focal lesion. Sinuses/Orbits: Partial right mastoid effusion. Bilateral intra-ocular lens replacement. Otherwise negative. Other: None. IMPRESSION: 1. No acute intracranial abnormality identified. 2. Stable advanced chronic microvascular ischemic changes, parenchymal volume loss, and basal ganglia lacunar infarcts. Electronically Signed   By: Mitzi Hansen M.D.   On: 10/31/2017 23:05        Scheduled Meds: . clopidogrel  75 mg Oral Q breakfast  . divalproex  625 mg Oral QHS  . feeding supplement (PRO-STAT SUGAR FREE 64)  30 mL Oral BID  . ferrous sulfate  325 mg Oral Q breakfast  . insulin aspart  0-9 Units Subcutaneous Q4H  . insulin glargine  18 Units Subcutaneous  QPM  . lactulose  30 g Oral BID  . memantine  14 mg Oral QHS  . multivitamin with minerals  1 tablet Oral Daily  . polyethylene glycol powder  1 Container Oral Once  . protein supplement shake  11 oz Oral BID BM  . rosuvastatin  5 mg Oral Q M,W,F   Continuous Infusions: . piperacillin-tazobactam (ZOSYN)  IV Stopped (11/01/17 2203)  . vancomycin Stopped (11/02/17 0520)     LOS: 1 day       Alwyn Ren, MD [facility]  If 7PM-7AM, please contact night-coverage [email or web address] Password [facility] 11/02/2017, 9:40 AM

## 2017-11-03 ENCOUNTER — Other Ambulatory Visit: Payer: Self-pay

## 2017-11-03 ENCOUNTER — Inpatient Hospital Stay (HOSPITAL_COMMUNITY): Payer: Medicare Other

## 2017-11-03 ENCOUNTER — Encounter (HOSPITAL_COMMUNITY): Payer: Self-pay | Admitting: General Practice

## 2017-11-03 DIAGNOSIS — R05 Cough: Secondary | ICD-10-CM

## 2017-11-03 DIAGNOSIS — N390 Urinary tract infection, site not specified: Secondary | ICD-10-CM

## 2017-11-03 LAB — GLUCOSE, CAPILLARY
GLUCOSE-CAPILLARY: 238 mg/dL — AB (ref 65–99)
Glucose-Capillary: 218 mg/dL — ABNORMAL HIGH (ref 65–99)
Glucose-Capillary: 267 mg/dL — ABNORMAL HIGH (ref 65–99)
Glucose-Capillary: 222 mg/dL — ABNORMAL HIGH (ref 65–99)
Glucose-Capillary: 323 mg/dL — ABNORMAL HIGH (ref 65–99)

## 2017-11-03 LAB — RESPIRATORY PANEL BY PCR
Adenovirus: NOT DETECTED
BORDETELLA PERTUSSIS-RVPCR: NOT DETECTED
CHLAMYDOPHILA PNEUMONIAE-RVPPCR: NOT DETECTED
Coronavirus 229E: NOT DETECTED
Coronavirus HKU1: NOT DETECTED
Coronavirus NL63: NOT DETECTED
Coronavirus OC43: NOT DETECTED
INFLUENZA A-RVPPCR: NOT DETECTED
INFLUENZA B-RVPPCR: NOT DETECTED
MYCOPLASMA PNEUMONIAE-RVPPCR: NOT DETECTED
Metapneumovirus: NOT DETECTED
PARAINFLUENZA VIRUS 3-RVPPCR: NOT DETECTED
PARAINFLUENZA VIRUS 4-RVPPCR: NOT DETECTED
Parainfluenza Virus 1: NOT DETECTED
Parainfluenza Virus 2: NOT DETECTED
RESPIRATORY SYNCYTIAL VIRUS-RVPPCR: NOT DETECTED
RHINOVIRUS / ENTEROVIRUS - RVPPCR: NOT DETECTED

## 2017-11-03 LAB — BASIC METABOLIC PANEL
Anion gap: 8 (ref 5–15)
BUN: 17 mg/dL (ref 6–20)
CALCIUM: 9.4 mg/dL (ref 8.9–10.3)
CHLORIDE: 104 mmol/L (ref 101–111)
CO2: 24 mmol/L (ref 22–32)
CREATININE: 0.64 mg/dL (ref 0.44–1.00)
GFR calc Af Amer: >60 mL/min (ref >60)
GFR calc non Af Amer: >60 mL/min (ref >60)
Glucose, Bld: 214 mg/dL — ABNORMAL HIGH (ref 65–99)
Potassium: 4 mmol/L (ref 3.5–5.1)
SODIUM: 136 mmol/L (ref 135–145)

## 2017-11-03 LAB — PROCALCITONIN: Procalcitonin: 0.1 ng/mL

## 2017-11-03 NOTE — Progress Notes (Signed)
PROGRESS NOTE    Megan [name]  ZOX:[id number] DOB: Jan 26, 1922 DOA: 10/31/2017 PCP: [name], MD  Brief Narrative: 84 y.o.year old femalewith medical history significant for hyperlipidemia, type 2 diabetes, seizure, dementia, history of CVA in 2013, and decubitus ulcerswho presented on3/11/2019with worsening confusion and decreased responsivenessand was found to haveaurinary tract infection and possible right upper lobe pneumonia. 11/02/2017 patient known to me from previous admissions.  At baseline she does not speak a lot.  Patient stays with her daughter at home.  She was awake when I saw her this morning.  She was not in any distress.  Daughter reported that she has not had a bowel movement for 3 days.  Denies any cough nausea vomiting fever or chills.    Assessment & Plan:   Active Problems:   Right upper lobe pneumonia (HCC)   Diabetes mellitus type II, uncontrolled (HCC)   HTN (hypertension)   History of partial seizures   History of CVA w/residual right side deficits   Protein-calorie malnutrition, severe (HCC)   Anemia   Vascular dementia   Dehydration   Unstageable pressure ulcer of sacral region [facility])   UTI (urinary tract infection)   Increased ammonia level   Acute metabolic encephalopathy   HCAP (healthcare-associated pneumonia)  #Altered mental status, improving Patient presented with altered mental statuslikely in the setting of urinary tract infection and right upper lobe pneumonia. Patient was recently discharged for the symptoms and was improving prior to discharge. Patient also has a history of seizure and is on Depakote with subtherapeutic level. Altered mental status could also be secondary to possible undetected seizure activity. Patient is currently on vancomycin and Zosyn. No electrolytes abnormalities. Blood glucose in adequate range. Troponinshave been negative. Thyroid function is within normal limits. Head CT with no acute findings.  Mildly elevated lactic acid 1.99. Mildly elevated ammonia 53. Renal function is within normal limits. --Follow-up on RVP, urine culture, MRSA PCR, sputum culture --Continue vancomycin and Zosyn.  Narrow antibiotics once cultures back. --Lactulose30 mg twice daily --Continue Namenda --Monitor vitals --Could increase valproic acid dosage by 25% per neurology --Could consider EEG if mental status is waxing and waningper Neuro  #Urinary tract infection, subacute Patient was recently treated in the hospital for UTI and discharged home on Vantin. No cultures were obtained during previous admission. This admission UA shows unresolved UTI with large leukocytes and negative nitrite. --We will follow-up on urine cultures --Continue ceftriaxone,patient also on vancomycin and Zosyn  Right upper lobe pneumonia,subacute -repeat cxr. Patient was recently treated for similar symptoms and was discharged from cephalosporin and azithromycin. Mild lactic acidosis on admission. Chest x-ray on admission consistent with right upper lobe infiltrate. Initial concern for aspiration pneumonia given issues with dysphasia. Still a possibility given the patient is always recumbent aspiration will affect right upper lobe.  Coag negative staph detected in blood culture.  Await final identification and sensitivity. --Continue vancomycin and Zosyn we will de-escalate after MRSA PCR Off of oxygen   #Dehydration,acute Patient received IV fluid in the ED, she presented with lactic acidosis of 1.99. Renal function was mildly elevated from discharge. No evidence of diarrhea or emesis. This morning blood pressure was 76/65. Patient was bolused with 500 mL of normal saline  #Type 2 diabetes, uncontrolled With glucose on admission was 174. A1c 8.6. --Continue sensitive sliding scale insulin --Continue Lantus 18 units nightly --CBGevery 4  Hypertension, stable With admission patient's blood pressure was  130/64. Has blood pressure overnight was 159/48. This morning patient was  hypotensive with lowest blood pressure 76/65. Concerning for possible sepsis given UTI and right upper lobe pneumonia. Patient was bolused 500 mm normal saline and responded well. Currently blood pressure is 112/51. --Will continue to monitor  #Anemia Hemoglobin is 9.9 down from 11.5 on discharge (10/27/2017). Baseline appears to be on 10.5-11.5. Iron panel was notconsistent for iron deficiency anemia. No obvious signs of bleeding. Could be dilutional. --We will continue to monitor --Follow-up on a.m. CBC   #Seizures Patient has a history of seizures. Patient has been on Depakote. Patient is also subtherapeutic. Discussion with neurology recommendation to increase dosage by 25%. Could consider EEG if mental status do not improve in the next few hours. --Continue Depakote 500 mg nightly  #Protein calorie malnutrition --Continue supplementation  #Decubitus pressure ulcer Wound care consulted, follow-up on recommendations   #Constipation- resolved.    DVT prophylaxis:scd Code Status:dnr Family Communication:dw daughter  Disposition Plan hope to dc in 24 to 48 hours Consultants:  none Procedures:none Antimicrobials:vanco zosyn  Subjective: Much more awake and talking .Marland Kitchen  Objective: Vitals:   11/02/17 0415 11/02/17 1400 11/02/17 2036 11/03/17 0521  BP: 138/71 131/68 (!) 155/76 (!) 160/74  Pulse: 78 74 92 77  Resp: 16  17 16   Temp: 98.2 F (36.8 C) (!) 97.1 F (36.2 C) 99.7 F (37.6 C) 98.1 F (36.7 C)  TempSrc:   Oral Oral  SpO2: 99% 97% 100% 100%  Weight:      Height:        Intake/Output Summary (Last 24 hours) at 11/03/2017 1029 Last data filed at 11/03/2017 0311 Gross per 24 hour  Intake 830 ml  Output 850 ml  Net -20 ml   Filed Weights   10/31/17 2105  Weight: 67.1 kg (148 lb)    Examination:  General exam: Appears calm and comfortable  Respiratory system: Clear  to auscultation. Respiratory effort normal. Cardiovascular system: S1 & S2 heard, RRR. No JVD, murmurs, rubs, gallops or clicks. No pedal edema. Gastrointestinal system: Abdomen is nondistended, soft and nontender. No organomegaly or masses felt. Normal bowel sounds heard. Central nervous system: Alert and oriented. No focal neurological deficits. Extremities: Symmetric 5 x 5 power. Skin: No rashes, lesions or ulcers Psychiatry: Judgement and insight appear normal. Mood & affect appropriate.     Data Reviewed: I have personally reviewed following labs and imaging studies  CBC: Recent Labs  Lab 10/31/17 2115  WBC 11.6*  HGB 9.9*  HCT 30.4*  MCV 88.9  PLT 290   Basic Metabolic Panel: Recent Labs  Lab 10/31/17 2115 11/01/17 0243 11/01/17 0830 11/03/17 0653  NA 139 140  --  136  K 4.6 4.7  --  4.0  CL 107 107  --  104  CO2 23 23  --  24  GLUCOSE 174* 186*  --  214*  BUN 43* 36*  --  17  CREATININE 1.07* 0.87  --  0.64  CALCIUM 9.8 9.5  --  9.4  MG  --   --  2.2  --   PHOS  --   --  2.8  --    GFR: Estimated Creatinine Clearance: 39.5 mL/min (by C-G formula based on SCr of 0.64 mg/dL). Liver Function Tests: Recent Labs  Lab 10/31/17 2115 11/01/17 0243  AST 37 35  ALT 21 20  ALKPHOS 110 110  BILITOT 0.4 0.5  PROT 7.1 7.1  ALBUMIN 1.9* 2.0*   No results for input(s): LIPASE, AMYLASE in the last 168 hours. Recent Labs  Lab 10/31/17 2218 11/01/17 0242  AMMONIA 53* 33   Coagulation Profile: Recent Labs  Lab 11/01/17 0041  INR 1.08   Cardiac Enzymes: Recent Labs  Lab 11/01/17 0041 11/01/17 0830 11/01/17 1212  CKTOTAL 126  --   --   TROPONINI <0.03 <0.03 <0.03   BNP (last 3 results) No results for input(s): PROBNP in the last 8760 hours. HbA1C: Recent Labs    11/01/17 0243  HGBA1C 8.6*   CBG: Recent Labs  Lab 11/02/17 1228 11/02/17 1706 11/02/17 2026 11/03/17 0032 11/03/17 0408  GLUCAP 199* 199* 233* 238* 218*   Lipid Profile: No  results for input(s): CHOL, HDL, LDLCALC, TRIG, CHOLHDL, LDLDIRECT in the last 72 hours. Thyroid Function Tests: Recent Labs    11/01/17 0243  TSH 1.151   Anemia Panel: Recent Labs    11/01/17 0243 11/01/17 0830  VITAMINB12  --  788  FOLATE 18.9  --   FERRITIN  --  71  TIBC  --  217*  IRON  --  28  RETICCTPCT  --  1.8   Sepsis Labs: Recent Labs  Lab 10/31/17 2136 11/01/17 1036 11/01/17 1212 11/02/17 0443 11/03/17 0653  PROCALCITON  --   --  0.24 0.19 0.10  LATICACIDVEN 1.99* 1.9  --   --   --     Recent Results (from the past 240 hour(s))  Culture, blood (routine x 2)     Status: None   Collection Time: 10/26/17  1:55 PM  Result Value Ref Range Status   Specimen Description BLOOD LEFT ANTECUBITAL  Final   Special Requests   Final    BOTTLES DRAWN AEROBIC AND ANAEROBIC Blood Culture adequate volume   Culture   Final    NO GROWTH 5 DAYS Performed at [facility], [address] [place], [place] [address]    Report Status 10/31/2017 FINAL  Final  Culture, blood (routine x 2)     Status: Abnormal   Collection Time: 10/26/17  2:37 PM  Result Value Ref Range Status   Specimen Description BLOOD RIGHT HAND  Final   Special Requests IN PEDIATRIC BOTTLE Blood Culture adequate volume  Final   Culture  Setup Time   Final    GRAM POSITIVE COCCI IN PEDIATRIC BOTTLE CRITICAL RESULT CALLED TO, READ BACK BY AND VERIFIED WITH: Antoine PrimasE. Martin PharmD 12:05 10/27/17 ([email or web address])    Culture (A)  Final    STAPHYLOCOCCUS SPECIES (COAGULASE NEGATIVE) THE SIGNIFICANCE OF ISOLATING THIS ORGANISM FROM A SINGLE SET OF BLOOD CULTURES WHEN MULTIPLE SETS ARE DRAWN IS UNCERTAIN. PLEASE NOTIFY THE MICROBIOLOGY DEPARTMENT WITHIN ONE WEEK IF SPECIATION AND SENSITIVITIES ARE REQUIRED. Performed at [facility], [address] [place], [place] [address]    Report Status 10/29/2017 FINAL  Final  Blood Culture ID Panel (Reflexed)     Status: Abnormal   Collection Time: 10/26/17  2:37 PM    Result Value Ref Range Status   Enterococcus species NOT DETECTED NOT DETECTED Final   Listeria monocytogenes NOT DETECTED NOT DETECTED Final   Staphylococcus species DETECTED (A) NOT DETECTED Final    Comment: Methicillin (oxacillin) susceptible coagulase negative staphylococcus. Possible blood culture contaminant (unless isolated from more than one blood culture draw or clinical case suggests pathogenicity). No antibiotic treatment is indicated for blood  culture contaminants. CRITICAL RESULT CALLED TO, READ BACK BY AND VERIFIED WITH: Antoine PrimasE. Martin PharmD 12:05 10/27/17 ([email or web address])    Staphylococcus aureus NOT DETECTED NOT DETECTED Final   Methicillin resistance NOT  DETECTED NOT DETECTED Final   Streptococcus species NOT DETECTED NOT DETECTED Final   Streptococcus agalactiae NOT DETECTED NOT DETECTED Final   Streptococcus pneumoniae NOT DETECTED NOT DETECTED Final   Streptococcus pyogenes NOT DETECTED NOT DETECTED Final   Acinetobacter baumannii NOT DETECTED NOT DETECTED Final   Enterobacteriaceae species NOT DETECTED NOT DETECTED Final   Enterobacter cloacae complex NOT DETECTED NOT DETECTED Final   Escherichia coli NOT DETECTED NOT DETECTED Final   Klebsiella oxytoca NOT DETECTED NOT DETECTED Final   Klebsiella pneumoniae NOT DETECTED NOT DETECTED Final   Proteus species NOT DETECTED NOT DETECTED Final   Serratia marcescens NOT DETECTED NOT DETECTED Final   Haemophilus influenzae NOT DETECTED NOT DETECTED Final   Neisseria meningitidis NOT DETECTED NOT DETECTED Final   Pseudomonas aeruginosa NOT DETECTED NOT DETECTED Final   Candida albicans NOT DETECTED NOT DETECTED Final   Candida glabrata NOT DETECTED NOT DETECTED Final   Candida krusei NOT DETECTED NOT DETECTED Final   Candida parapsilosis NOT DETECTED NOT DETECTED Final   Candida tropicalis NOT DETECTED NOT DETECTED Final  Urine culture     Status: None   Collection Time: 10/31/17  9:20 PM  Result Value Ref Range Status    Specimen Description URINE, RANDOM  Final   Special Requests NONE  Final   Culture   Final    NO GROWTH Performed at [facility], [address], [place], [place] [address]    Report Status 11/02/2017 FINAL  Final  Respiratory Panel by PCR     Status: None   Collection Time: 11/02/17  4:25 PM  Result Value Ref Range Status   Adenovirus NOT DETECTED NOT DETECTED Final   Coronavirus 229E NOT DETECTED NOT DETECTED Final   Coronavirus HKU1 NOT DETECTED NOT DETECTED Final   Coronavirus NL63 NOT DETECTED NOT DETECTED Final   Coronavirus OC43 NOT DETECTED NOT DETECTED Final   Metapneumovirus NOT DETECTED NOT DETECTED Final   Rhinovirus / Enterovirus NOT DETECTED NOT DETECTED Final   Influenza A NOT DETECTED NOT DETECTED Final   Influenza B NOT DETECTED NOT DETECTED Final   Parainfluenza Virus 1 NOT DETECTED NOT DETECTED Final   Parainfluenza Virus 2 NOT DETECTED NOT DETECTED Final   Parainfluenza Virus 3 NOT DETECTED NOT DETECTED Final   Parainfluenza Virus 4 NOT DETECTED NOT DETECTED Final   Respiratory Syncytial Virus NOT DETECTED NOT DETECTED Final   Bordetella pertussis NOT DETECTED NOT DETECTED Final   Chlamydophila pneumoniae NOT DETECTED NOT DETECTED Final   Mycoplasma pneumoniae NOT DETECTED NOT DETECTED Final    Comment: Performed at [facility], [address], [place], [place] [address]         Radiology Studies: No results found.      Scheduled Meds: . clopidogrel  75 mg Oral Q breakfast  . divalproex  625 mg Oral QHS  . feeding supplement (PRO-STAT SUGAR FREE 64)  30 mL Oral BID  . ferrous sulfate  325 mg Oral Q breakfast  . insulin aspart  0-9 Units Subcutaneous Q4H  . insulin glargine  18 Units Subcutaneous QPM  . lactulose  30 g Oral BID  . memantine  14 mg Oral QHS  . multivitamin with minerals  1 tablet Oral Daily  . protein supplement shake  11 oz Oral BID BM  . rosuvastatin  5 mg Oral Q M,W,F   Continuous Infusions: .  piperacillin-tazobactam (ZOSYN)  IV 3.375 g (11/03/17 0535)  . vancomycin Stopped (11/03/17 0418)  LOS: 2 days      Alwyn Ren, MD [facility]  If 7PM-7AM, please contact night-coverage [email or web address] Password [facility] 11/03/2017, 10:29 AM

## 2017-11-03 NOTE — Progress Notes (Signed)
Inpatient Diabetes Program Recommendations  AACE/ADA: New Consensus Statement on Inpatient Glycemic Control (2015)  Target Ranges:  Prepandial:   less than 140 mg/dL      Peak postprandial:   less than 180 mg/dL (1-2 hours)      Critically ill patients:  140 - 180 mg/dL   Results for Megan [name], Megan [name] (MRN [id number]) as of 11/03/2017 11:13  Ref. Range 11/02/2017 08:03 11/02/2017 12:28 11/02/2017 17:06 11/02/2017 20:26 11/03/2017 00:32 11/03/2017 04:08  Glucose-Capillary Latest Ref Range: 65 - 99 mg/dL 409140 (H) 811199 (H) 914199 (H) 233 (H) 238 (H) 218 (H)   Review of Glycemic Control  Diabetes history: DM 2 Outpatient Diabetes medications: Toujeo 14 units Daily Current orders for Inpatient glycemic control: Lantus 18 units qpm, Novolog Sensitive Correction 0-9 units q4hours  Inpatient Diabetes Program Recommendations:    Glucose trends in the 200's, consider increasing Lantus to 22 units.  Thanks,  Christena DeemShannon Leisel Pinette RN, MSN, BC-ADM, [facility] Inpatient Diabetes Coordinator Team Pager [phone] (8a-5p)

## 2017-11-04 DIAGNOSIS — G9341 Metabolic encephalopathy: Secondary | ICD-10-CM

## 2017-11-04 DIAGNOSIS — K5900 Constipation, unspecified: Secondary | ICD-10-CM

## 2017-11-04 LAB — GLUCOSE, CAPILLARY
GLUCOSE-CAPILLARY: 122 mg/dL — AB (ref 65–99)
Glucose-Capillary: 146 mg/dL — ABNORMAL HIGH (ref 65–99)
Glucose-Capillary: 160 mg/dL — ABNORMAL HIGH (ref 65–99)
Glucose-Capillary: 256 mg/dL — ABNORMAL HIGH (ref 65–99)

## 2017-11-04 MED ORDER — LACTULOSE 10 GM/15ML PO SOLN: 30.0000 g | Freq: Two times a day (BID) | ORAL | 0 refills | Status: DC

## 2017-11-04 MED ORDER — BISACODYL 10 MG RE SUPP: 10.0000 mg | Freq: Every day | RECTAL | 0 refills | Status: DC | PRN

## 2017-11-04 MED ORDER — ADULT MULTIVITAMIN W/MINERALS CH: 1.0000 | ORAL_TABLET | Freq: Every day | ORAL | Status: DC

## 2017-11-04 MED ORDER — AMOXICILLIN-POT CLAVULANATE 500-125 MG PO TABS: 1.0000 | ORAL_TABLET | Freq: Two times a day (BID) | ORAL | 0 refills | Status: DC

## 2017-11-04 MED ORDER — ACETAMINOPHEN 325 MG PO TABS: 650.0000 mg | ORAL_TABLET | Freq: Four times a day (QID) | ORAL | Status: AC | PRN

## 2017-11-04 NOTE — Care Management Note (Addendum)
Case Management Note  Patient Details  Name: Omar PersonRuby Arseneau MRN: [id number] Date of Birth: 1922-02-20  Subjective/Objective:                    Action/Plan:Nurse requesting [facility] transportation to be arranged for today between 3 pm and 4 pm. Called [facility] with request.  Spoke with patient and daughter at bedside. Daughter requesting ambulance transport home for her mother , confirmed face sheet address. Patient has all DME at home already.   Patient was active with [facility] prior to admission with RN,PT,OT. Paged Dr Ashley RoyaltyMatthews for resumption of care orders. Expected Discharge Date:  11/04/17               Expected Discharge Plan:  Home w Home Health Services  In-House Referral:  NA  Discharge planning Services  CM Consult  Post Acute Care Choice:  Home Health Choice offered to:  Patient, Adult Children  DME Arranged:  N/A DME Agency:  NA  HH Arranged:  RN, PT, OT HH Agency:  [facility]  Status of Service:  In process, will continue to follow  If discussed at Long Length of Stay Meetings, dates discussed:    Additional Comments:  Kingsley PlanWile, Shaylene Paganelli Marie, RN 11/04/2017, 10:17 AM

## 2017-11-04 NOTE — Care Management Important Message (Signed)
Important Message  Patient Details  Name: Megan [name] MRN: [id number] Date of Birth: 1922-04-24   Medicare Important Message Given:  Yes  Signed copy left with family member   Dorena Bodoris Ahmet Schank 11/04/2017, 11:18 AM

## 2017-11-04 NOTE — Discharge Summary (Signed)
Physician Discharge Summary  Megan [name] ONG:[id number] DOB: [birth year] DOA: 10/31/2017  PCP: Megan [name], John, MD  Admit date: 10/31/2017 Discharge date: 11/04/2017  Admitted From home Disposition: Home Recommendations for Outpatient Follow-up:  1. Follow up with PCP in 1-2 weeks 2. Please obtain BMP/CBC in one week  Home Health none Equipment/Devices none Discharge Condition stable CODE STATUS DO NOT RESUSCITATE Diet recommendation: Carb modified diet Brief/Interim Summary:84 y.o.year old femalewith medical history significant for hyperlipidemia, type 2 diabetes, seizure, dementia, history of CVA in 2013, and decubitus ulcerswho presented on3/11/2019with worsening confusion and decreased responsivenessand was found to haveaurinary tract infection and possible right upper lobe pneumonia. 11/02/2017 patient known to me from previous admissions.At baseline she does not speak a lot. Patient stays with her daughter at home. She was awake when I saw her this morning. She was not in any distress. Daughter reported that she has not had a bowel movement for 3 days. Denies any cough nausea vomiting fever or chills.  11/04/2017 patient's daughter feels that she is back to baseline.  She is in no acute distress.  She slept better.  She had huge bowel movements.  She appears to be sleeping comfortably at this time.  She is not on oxygen.  Her vital signs are stable.  She denies any cough.  No nausea vomiting.   Discharge Diagnoses:  Active Problems:   Right upper lobe pneumonia (HCC)   Diabetes mellitus type II, uncontrolled (HCC)   HTN (hypertension)   History of partial seizures   History of CVA w/residual right side deficits   Protein-calorie malnutrition, severe (HCC)   Anemia   Vascular dementia   Dehydration   Unstageable pressure ulcer of sacral region [facility](HCC)   UTI (urinary tract infection)   Increased ammonia level   Acute metabolic encephalopathy   HCAP  (healthcare-associated pneumonia)  #Altered mental status-resolved. Patient presented with altered mental statuslikely in the setting of right upper lobe pneumonia.  \Right upper lobe pneumonia,subacute -repeat cxr.  Chest x-ray shows excellent improvement in the right upper lobe infiltrates.  I will discharge her on Augmentin for 5 more days. Off of oxygen   #Dehydration,acute resolved with IV hydration.    #Type 2 diabetes, uncontrolled will resume her home dose of long-acting insulin.  Hypertension, stable  #Anemia of chronic disease.  Patient takes iron replacement tablets at home.  Seizures Patient has a history of seizures. Patient has been on Depakote.--Continue Depakote 500 mg nightly.  Patient's mental status improved during this hospital stay and the daughter stated that she was at baseline prior to discharge.  #Protein calorie malnutrition --Continue supplementation   #Constipation- resolved.      Discharge Instructions  Discharge Instructions    Call MD for:  extreme fatigue   Complete by:  As directed    Call MD for:  persistant dizziness or light-headedness   Complete by:  As directed    Call MD for:  persistant nausea and vomiting   Complete by:  As directed    Call MD for:  severe uncontrolled pain   Complete by:  As directed    Call MD for:  temperature >100.4   Complete by:  As directed    Diet - low sodium heart healthy   Complete by:  As directed    Increase activity slowly   Complete by:  As directed      Allergies as of 11/04/2017      Reactions   Lipitor [atorvastatin] Other (See Comments)  Myalgias       Medication List    STOP taking these medications   azithromycin 250 MG tablet Commonly known as:  ZITHROMAX   cefpodoxime 200 MG tablet Commonly known as:  VANTIN     TAKE these medications   acetaminophen 325 MG tablet Commonly known as:  TYLENOL Take 2 tablets (650 mg total) by mouth every 6 (six) hours as  needed for mild pain (or Fever >/= 101).   amoxicillin-clavulanate 500-125 MG tablet Commonly known as:  AUGMENTIN Take 1 tablet (500 mg total) by mouth 2 (two) times daily.   bisacodyl 10 MG suppository Commonly known as:  DULCOLAX Place 1 suppository (10 mg total) rectally daily as needed for moderate constipation.   clopidogrel 75 MG tablet Commonly known as:  PLAVIX Take 1 tablet (75 mg total) by mouth daily with breakfast.   divalproex 500 MG 24 hr tablet Commonly known as:  DEPAKOTE ER Take 1 tablet (500 mg total) by mouth at bedtime.   famotidine 20 MG tablet Commonly known as:  PEPCID Take 1 tablet (20 mg total) by mouth daily.   feeding supplement (PRO-STAT SUGAR FREE 64) Liqd Take 30 mLs by mouth 4 (four) times daily -  with meals and at bedtime.   ferrous sulfate 325 (65 FE) MG tablet Take 325 mg by mouth daily with breakfast.   guaiFENesin 600 MG 12 hr tablet Commonly known as:  MUCINEX Take 600 mg by mouth 2 (two) times daily as needed for to loosen phlegm.   lactulose 10 GM/15ML solution Commonly known as:  CHRONULAC Take 45 mLs (30 g total) by mouth 2 (two) times daily.   Melatonin 5 MG Tabs Take 5 mg by mouth at bedtime as needed (sleep).   multivitamin with minerals Tabs tablet Take 1 tablet by mouth daily.   NAMENDA XR 14 MG Cp24 24 hr capsule Generic drug:  memantine Take 14 mg by mouth daily.   nystatin powder Generic drug:  nystatin Apply 1 application topically 2 (two) times daily as needed for rash.   polyethylene glycol packet Commonly known as:  MIRALAX Take 17 g by mouth daily.   protein supplement shake Liqd Commonly known as:  PREMIER PROTEIN Take 59.1 mLs (2 oz total) by mouth 4 (four) times daily -  with meals and at bedtime.   rosuvastatin 5 MG tablet Commonly known as:  CRESTOR Take 5 mg by mouth every Monday, Wednesday, and Friday.   sodium chloride 1 g tablet Take 1 g by mouth daily.   TOUJEO SOLOSTAR 300 UNIT/ML  Sopn Generic drug:  Insulin Glargine Inject 14 Units as directed every evening. What changed:  how much to take   vitamin C 500 MG tablet Commonly known as:  ASCORBIC ACID Take 500 mg by mouth daily.   zinc oxide 11.3 % Crea cream Commonly known as:  BALMEX Apply 1 application topically daily as needed (for rash).       Allergies  Allergen Reactions  . Lipitor [Atorvastatin] Other (See Comments)    Myalgias     Consultations: none   Procedures/Studies: Dg Chest 1 View  Result Date: 10/31/2017 CLINICAL DATA:  84 year old female with altered mental status. EXAM: CHEST 1 VIEW COMPARISON:  Chest radiograph dated 10/26/2017 FINDINGS: There is shallow inspiration with bibasilar atelectasis/scarring. An area of increased density in the right upper lung field may be chronic or represent developing infiltrate. Clinical correlation is recommended. There is no large pleural effusion. No pneumothorax. Stable cardiac silhouette.  Atherosclerotic calcification of the aortic arch. Osteopenia with degenerative changes of the spine. No acute osseous pathology. IMPRESSION: Chronic changes and interstitial coarsening versus developing infiltrate in the right upper lung field. Clinical correlation is recommended. Electronically Signed   By: Elgie Collard M.D.   On: 10/31/2017 23:19   Dg Chest 2 View  Result Date: 10/26/2017 CLINICAL DATA:  Altered mental status, hypotension, fatigue, diabetes mellitus, history of stroke and seizures EXAM: CHEST - 2 VIEW COMPARISON:  06/18/2017 FINDINGS: Minimal enlargement of cardiac silhouette. Atherosclerotic calcification aorta. RIGHT upper lobe airspace opacity consistent with pneumonia. BILATERAL lower lobe opacities which may represent atelectasis or known fibrosis though mild superimposed acute infiltrates are not completely excluded. Upper LEFT lung clear. No pleural effusion or pneumothorax. No acute osseous findings. IMPRESSION: RIGHT upper lobe pneumonia.  Chronic bibasilar opacities favoring fibrosis and atelectasis. Electronically Signed   By: Ulyses Southward M.D.   On: 10/26/2017 15:54   Dg Abd 1 View  Result Date: 11/01/2017 CLINICAL DATA:  84 y/o F; lethargic and not verbal. Presenting with altered mental status. Immediate 10/26/2017 for pneumonia. History of CVA with right-sided weakness. EXAM: ABDOMEN - 1 VIEW COMPARISON:  10/31/2017 chest radiograph. FINDINGS: Normal bowel gas pattern. Moderate volume of stool throughout the colon. Vascular calcifications. Bones are demineralized and there are multilevel degenerative changes of the spine. Coarse interstitial opacities in the lung bases better characterized on recent chest radiograph. IMPRESSION: Normal bowel gas pattern. Moderate volume of stool throughout the colon. Electronically Signed   By: Mitzi Hansen M.D.   On: 11/01/2017 02:26   Ct Head Wo Contrast  Result Date: 10/31/2017 CLINICAL DATA:  84 y/o  F; evaluation of altered mental status. EXAM: CT HEAD WITHOUT CONTRAST TECHNIQUE: Contiguous axial images were obtained from the base of the skull through the vertex without intravenous contrast. COMPARISON:  10/26/2017 CT head FINDINGS: Brain: No evidence of acute infarction, hemorrhage, hydrocephalus, extra-axial collection or mass lesion/mass effect. Stable advanced chronic microvascular ischemic changes, diffuse parenchymal volume loss of the brain, and basal ganglia chronic lacunar infarctions. Vascular: Calcific atherosclerosis of carotid siphons and vertebral arteries. No hyperdense vessel identified. Skull: Normal. Negative for fracture or focal lesion. Sinuses/Orbits: Partial right mastoid effusion. Bilateral intra-ocular lens replacement. Otherwise negative. Other: None. IMPRESSION: 1. No acute intracranial abnormality identified. 2. Stable advanced chronic microvascular ischemic changes, parenchymal volume loss, and basal ganglia lacunar infarcts. Electronically Signed   By: Mitzi Hansen M.D.   On: 10/31/2017 23:05   Ct Head Wo Contrast  Result Date: 10/26/2017 CLINICAL DATA:  Altered mental status, newly nonverbal today. EXAM: CT HEAD WITHOUT CONTRAST TECHNIQUE: Contiguous axial images were obtained from the base of the skull through the vertex without intravenous contrast. COMPARISON:  06/18/2017. FINDINGS: Brain: No definite evidence of an acute infarct, acute hemorrhage, mass lesion, mass effect or hydrocephalus. Atrophy. Periventricular low attenuation. Probable remote infarcts in the basal ganglia bilaterally. Vascular: No hyperdense vessel or unexpected calcification. Skull: Normal. Negative for fracture or focal lesion. Sinuses/Orbits: No acute finding. Other: Right mastoid effusion. IMPRESSION: 1. No acute intracranial abnormality. 2. Atrophy and chronic microvascular white matter ischemic changes. 3. Remote basal ganglia lacunar infarcts. 4. Right mastoid effusion. Electronically Signed   By: Leanna Battles M.D.   On: 10/26/2017 13:16   Dg Chest Port 1 View  Result Date: 11/03/2017 CLINICAL DATA:  84 year old female with cough. EXAM: PORTABLE CHEST 1 VIEW COMPARISON:  10/31/2017 and earlier. FINDINGS: Portable AP semi upright view at 1531 hours. Stable low  lung volumes. Stable cardiac size and mediastinal contours. Calcified aortic atherosclerosis. Visualized tracheal air column is within normal limits. Interstitial and indistinct right upper lung opacity has regressed since 10/26/2017. Lung parenchyma elsewhere appears stable. No pneumothorax. No pleural effusion is evident. No new pulmonary opacity. Negative visible bowel gas pattern. IMPRESSION: 1. Regressed right upper lobe pneumonia since 10/26/2017. Mild residual. 2. Stable bibasilar hypo-ventilation. 3. No new cardiopulmonary abnormality identified. Electronically Signed   By: Odessa Fleming M.D.   On: 11/03/2017 15:57    (Echo, Carotid, EGD, Colonoscopy, ERCP)    Subjective:   Discharge Exam: Vitals:    11/03/17 2051 11/04/17 0458  BP: 135/64 (!) 153/64  Pulse: 84 76  Resp: 18 17  Temp: 98.2 F (36.8 C) 98.2 F (36.8 C)  SpO2: 99% 95%   Vitals:   11/03/17 1400 11/03/17 1443 11/03/17 2051 11/04/17 0458  BP: (!) 123/45 (!) 168/60 135/64 (!) 153/64  Pulse: 70  84 76  Resp: 16  18 17   Temp: 98.2 F (36.8 C)  98.2 F (36.8 C) 98.2 F (36.8 C)  TempSrc: Axillary  Oral Axillary  SpO2: 98%  99% 95%  Weight:      Height:        General: Pt is alert, awake, not in acute distress Cardiovascular: RRR, S1/S2 +, no rubs, no gallops Respiratory: CTA bilaterally, no wheezing, no rhonchi Abdominal: Soft, NT, ND, bowel sounds + Extremities: no edema, no cyanosis    The results of significant diagnostics from this hospitalization (including imaging, microbiology, ancillary and laboratory) are listed below for reference.     Microbiology: Recent Results (from the past 240 hour(s))  Culture, blood (routine x 2)     Status: None   Collection Time: 10/26/17  1:55 PM  Result Value Ref Range Status   Specimen Description BLOOD LEFT ANTECUBITAL  Final   Special Requests   Final    BOTTLES DRAWN AEROBIC AND ANAEROBIC Blood Culture adequate volume   Culture   Final    NO GROWTH 5 DAYS Performed at [facility], [address], [place], [place] [address]    Report Status 10/31/2017 FINAL  Final  Culture, blood (routine x 2)     Status: Abnormal   Collection Time: 10/26/17  2:37 PM  Result Value Ref Range Status   Specimen Description BLOOD RIGHT HAND  Final   Special Requests IN PEDIATRIC BOTTLE Blood Culture adequate volume  Final   Culture  Setup Time   Final    GRAM POSITIVE COCCI IN PEDIATRIC BOTTLE CRITICAL RESULT CALLED TO, READ BACK BY AND VERIFIED WITH: Antoine Primas PharmD 12:05 10/27/17 ([email or web address])    Culture (A)  Final    STAPHYLOCOCCUS SPECIES (COAGULASE NEGATIVE) THE SIGNIFICANCE OF ISOLATING THIS ORGANISM FROM A SINGLE SET OF BLOOD CULTURES WHEN MULTIPLE SETS ARE DRAWN IS  UNCERTAIN. PLEASE NOTIFY THE MICROBIOLOGY DEPARTMENT WITHIN ONE WEEK IF SPECIATION AND SENSITIVITIES ARE REQUIRED. Performed at [facility], [address], [place], [place] [address]    Report Status 10/29/2017 FINAL  Final  Blood Culture ID Panel (Reflexed)     Status: Abnormal   Collection Time: 10/26/17  2:37 PM  Result Value Ref Range Status   Enterococcus species NOT DETECTED NOT DETECTED Final   Listeria monocytogenes NOT DETECTED NOT DETECTED Final   Staphylococcus species DETECTED (A) NOT DETECTED Final    Comment: Methicillin (oxacillin) susceptible coagulase negative staphylococcus. Possible blood culture contaminant (unless isolated from more than one blood culture  draw or clinical case suggests pathogenicity). No antibiotic treatment is indicated for blood  culture contaminants. CRITICAL RESULT CALLED TO, READ BACK BY AND VERIFIED WITH: Antoine Primas PharmD 12:05 10/27/17 ([email or web address])    Staphylococcus aureus NOT DETECTED NOT DETECTED Final   Methicillin resistance NOT DETECTED NOT DETECTED Final   Streptococcus species NOT DETECTED NOT DETECTED Final   Streptococcus agalactiae NOT DETECTED NOT DETECTED Final   Streptococcus pneumoniae NOT DETECTED NOT DETECTED Final   Streptococcus pyogenes NOT DETECTED NOT DETECTED Final   Acinetobacter baumannii NOT DETECTED NOT DETECTED Final   Enterobacteriaceae species NOT DETECTED NOT DETECTED Final   Enterobacter cloacae complex NOT DETECTED NOT DETECTED Final   Escherichia coli NOT DETECTED NOT DETECTED Final   Klebsiella oxytoca NOT DETECTED NOT DETECTED Final   Klebsiella pneumoniae NOT DETECTED NOT DETECTED Final   Proteus species NOT DETECTED NOT DETECTED Final   Serratia marcescens NOT DETECTED NOT DETECTED Final   Haemophilus influenzae NOT DETECTED NOT DETECTED Final   Neisseria meningitidis NOT DETECTED NOT DETECTED Final   Pseudomonas aeruginosa NOT DETECTED NOT DETECTED Final   Candida albicans NOT DETECTED NOT DETECTED  Final   Candida glabrata NOT DETECTED NOT DETECTED Final   Candida krusei NOT DETECTED NOT DETECTED Final   Candida parapsilosis NOT DETECTED NOT DETECTED Final   Candida tropicalis NOT DETECTED NOT DETECTED Final  Urine culture     Status: None   Collection Time: 10/31/17  9:20 PM  Result Value Ref Range Status   Specimen Description URINE, RANDOM  Final   Special Requests NONE  Final   Culture   Final    NO GROWTH Performed at [facility], [address] [place], [place] [address]    Report Status 11/02/2017 FINAL  Final  Respiratory Panel by PCR     Status: None   Collection Time: 11/02/17  4:25 PM  Result Value Ref Range Status   Adenovirus NOT DETECTED NOT DETECTED Final   Coronavirus 229E NOT DETECTED NOT DETECTED Final   Coronavirus HKU1 NOT DETECTED NOT DETECTED Final   Coronavirus NL63 NOT DETECTED NOT DETECTED Final   Coronavirus OC43 NOT DETECTED NOT DETECTED Final   Metapneumovirus NOT DETECTED NOT DETECTED Final   Rhinovirus / Enterovirus NOT DETECTED NOT DETECTED Final   Influenza A NOT DETECTED NOT DETECTED Final   Influenza B NOT DETECTED NOT DETECTED Final   Parainfluenza Virus 1 NOT DETECTED NOT DETECTED Final   Parainfluenza Virus 2 NOT DETECTED NOT DETECTED Final   Parainfluenza Virus 3 NOT DETECTED NOT DETECTED Final   Parainfluenza Virus 4 NOT DETECTED NOT DETECTED Final   Respiratory Syncytial Virus NOT DETECTED NOT DETECTED Final   Bordetella pertussis NOT DETECTED NOT DETECTED Final   Chlamydophila pneumoniae NOT DETECTED NOT DETECTED Final   Mycoplasma pneumoniae NOT DETECTED NOT DETECTED Final    Comment: Performed at [facility], [address] [place], [place] [address]     Labs: BNP (last 3 results) No results for input(s): BNP in the last 8760 hours. Basic Metabolic Panel: Recent Labs  Lab 10/31/17 2115 11/01/17 0243 11/01/17 0830 11/03/17 0653  NA 139 140  --  136  K 4.6 4.7  --  4.0  CL 107 107  --  104  CO2 23 23  --   24  GLUCOSE 174* 186*  --  214*  BUN 43* 36*  --  17  CREATININE 1.07* 0.87  --  0.64  CALCIUM 9.8 9.5  --  9.4  MG  --   --  2.2  --   PHOS  --   --  2.8  --    Liver Function Tests: Recent Labs  Lab 10/31/17 2115 11/01/17 0243  AST 37 35  ALT 21 20  ALKPHOS 110 110  BILITOT 0.4 0.5  PROT 7.1 7.1  ALBUMIN 1.9* 2.0*   No results for input(s): LIPASE, AMYLASE in the last 168 hours. Recent Labs  Lab 10/31/17 2218 11/01/17 0242  AMMONIA 53* 33   CBC: Recent Labs  Lab 10/31/17 2115  WBC 11.6*  HGB 9.9*  HCT 30.4*  MCV 88.9  PLT 290   Cardiac Enzymes: Recent Labs  Lab 11/01/17 0041 11/01/17 0830 11/01/17 1212  CKTOTAL 126  --   --   TROPONINI <0.03 <0.03 <0.03   BNP: Invalid input(s): POCBNP CBG: Recent Labs  Lab 11/03/17 1625 11/03/17 2057 11/04/17 0002 11/04/17 0549 11/04/17 0815  GLUCAP 222* 323* 256* 146* 122*   D-Dimer No results for input(s): DDIMER in the last 72 hours. Hgb A1c No results for input(s): HGBA1C in the last 72 hours. Lipid Profile No results for input(s): CHOL, HDL, LDLCALC, TRIG, CHOLHDL, LDLDIRECT in the last 72 hours. Thyroid function studies No results for input(s): TSH, T4TOTAL, T3FREE, THYROIDAB in the last 72 hours.  Invalid input(s): FREET3 Anemia work up No results for input(s): VITAMINB12, FOLATE, FERRITIN, TIBC, IRON, RETICCTPCT in the last 72 hours. Urinalysis    Component Value Date/Time   COLORURINE YELLOW 10/31/2017 2120   APPEARANCEUR HAZY (A) 10/31/2017 2120   LABSPEC 1.018 10/31/2017 2120   PHURINE 5.0 10/31/2017 2120   GLUCOSEU 50 (A) 10/31/2017 2120   HGBUR NEGATIVE 10/31/2017 2120   BILIRUBINUR NEGATIVE 10/31/2017 2120   KETONESUR NEGATIVE 10/31/2017 2120   PROTEINUR NEGATIVE 10/31/2017 2120   UROBILINOGEN 0.2 10/23/2014 0830   NITRITE NEGATIVE 10/31/2017 2120   LEUKOCYTESUR LARGE (A) 10/31/2017 2120   Sepsis Labs Invalid input(s): PROCALCITONIN,  WBC,  LACTICIDVEN Microbiology Recent  Results (from the past 240 hour(s))  Culture, blood (routine x 2)     Status: None   Collection Time: 10/26/17  1:55 PM  Result Value Ref Range Status   Specimen Description BLOOD LEFT ANTECUBITAL  Final   Special Requests   Final    BOTTLES DRAWN AEROBIC AND ANAEROBIC Blood Culture adequate volume   Culture   Final    NO GROWTH 5 DAYS Performed at [facility], [address] [place], [place] [address]    Report Status 10/31/2017 FINAL  Final  Culture, blood (routine x 2)     Status: Abnormal   Collection Time: 10/26/17  2:37 PM  Result Value Ref Range Status   Specimen Description BLOOD RIGHT HAND  Final   Special Requests IN PEDIATRIC BOTTLE Blood Culture adequate volume  Final   Culture  Setup Time   Final    GRAM POSITIVE COCCI IN PEDIATRIC BOTTLE CRITICAL RESULT CALLED TO, READ BACK BY AND VERIFIED WITH: Antoine Primas PharmD 12:05 10/27/17 ([email or web address])    Culture (A)  Final    STAPHYLOCOCCUS SPECIES (COAGULASE NEGATIVE) THE SIGNIFICANCE OF ISOLATING THIS ORGANISM FROM A SINGLE SET OF BLOOD CULTURES WHEN MULTIPLE SETS ARE DRAWN IS UNCERTAIN. PLEASE NOTIFY THE MICROBIOLOGY DEPARTMENT WITHIN ONE WEEK IF SPECIATION AND SENSITIVITIES ARE REQUIRED. Performed at [facility], [address], [place], [place] [address]    Report Status 10/29/2017 FINAL  Final  Blood Culture ID Panel (Reflexed)  Status: Abnormal   Collection Time: 10/26/17  2:37 PM  Result Value Ref Range Status   Enterococcus species NOT DETECTED NOT DETECTED Final   Listeria monocytogenes NOT DETECTED NOT DETECTED Final   Staphylococcus species DETECTED (A) NOT DETECTED Final    Comment: Methicillin (oxacillin) susceptible coagulase negative staphylococcus. Possible blood culture contaminant (unless isolated from more than one blood culture draw or clinical case suggests pathogenicity). No antibiotic treatment is indicated for blood  culture contaminants. CRITICAL RESULT CALLED TO, READ BACK BY AND VERIFIED  WITH: Antoine Primas PharmD 12:05 10/27/17 ([email or web address])    Staphylococcus aureus NOT DETECTED NOT DETECTED Final   Methicillin resistance NOT DETECTED NOT DETECTED Final   Streptococcus species NOT DETECTED NOT DETECTED Final   Streptococcus agalactiae NOT DETECTED NOT DETECTED Final   Streptococcus pneumoniae NOT DETECTED NOT DETECTED Final   Streptococcus pyogenes NOT DETECTED NOT DETECTED Final   Acinetobacter baumannii NOT DETECTED NOT DETECTED Final   Enterobacteriaceae species NOT DETECTED NOT DETECTED Final   Enterobacter cloacae complex NOT DETECTED NOT DETECTED Final   Escherichia coli NOT DETECTED NOT DETECTED Final   Klebsiella oxytoca NOT DETECTED NOT DETECTED Final   Klebsiella pneumoniae NOT DETECTED NOT DETECTED Final   Proteus species NOT DETECTED NOT DETECTED Final   Serratia marcescens NOT DETECTED NOT DETECTED Final   Haemophilus influenzae NOT DETECTED NOT DETECTED Final   Neisseria meningitidis NOT DETECTED NOT DETECTED Final   Pseudomonas aeruginosa NOT DETECTED NOT DETECTED Final   Candida albicans NOT DETECTED NOT DETECTED Final   Candida glabrata NOT DETECTED NOT DETECTED Final   Candida krusei NOT DETECTED NOT DETECTED Final   Candida parapsilosis NOT DETECTED NOT DETECTED Final   Candida tropicalis NOT DETECTED NOT DETECTED Final  Urine culture     Status: None   Collection Time: 10/31/17  9:20 PM  Result Value Ref Range Status   Specimen Description URINE, RANDOM  Final   Special Requests NONE  Final   Culture   Final    NO GROWTH Performed at [facility], [address] [place], [place] [address]    Report Status 11/02/2017 FINAL  Final  Respiratory Panel by PCR     Status: None   Collection Time: 11/02/17  4:25 PM  Result Value Ref Range Status   Adenovirus NOT DETECTED NOT DETECTED Final   Coronavirus 229E NOT DETECTED NOT DETECTED Final   Coronavirus HKU1 NOT DETECTED NOT DETECTED Final   Coronavirus NL63 NOT DETECTED NOT DETECTED Final    Coronavirus OC43 NOT DETECTED NOT DETECTED Final   Metapneumovirus NOT DETECTED NOT DETECTED Final   Rhinovirus / Enterovirus NOT DETECTED NOT DETECTED Final   Influenza A NOT DETECTED NOT DETECTED Final   Influenza B NOT DETECTED NOT DETECTED Final   Parainfluenza Virus 1 NOT DETECTED NOT DETECTED Final   Parainfluenza Virus 2 NOT DETECTED NOT DETECTED Final   Parainfluenza Virus 3 NOT DETECTED NOT DETECTED Final   Parainfluenza Virus 4 NOT DETECTED NOT DETECTED Final   Respiratory Syncytial Virus NOT DETECTED NOT DETECTED Final   Bordetella pertussis NOT DETECTED NOT DETECTED Final   Chlamydophila pneumoniae NOT DETECTED NOT DETECTED Final   Mycoplasma pneumoniae NOT DETECTED NOT DETECTED Final    Comment: Performed at [facility], [address] [place], [place] [address]     Time coordinating discharge: Over 30 minutes  SIGNED:   Alwyn Ren, MD  [facility] 11/04/2017, 9:58 AM  If 7PM-7AM, please contact night-coverage [email or web address]  Password [email or web address]

## 2017-11-06 LAB — GLUCOSE, CAPILLARY: GLUCOSE-CAPILLARY: 165 mg/dL — AB (ref 65–99)

## 2017-11-08 LAB — CULTURE, BLOOD (ROUTINE X 2)
CULTURE: NO GROWTH
Culture: NO GROWTH
Special Requests: ADEQUATE

## 2017-11-11 ENCOUNTER — Other Ambulatory Visit: Payer: Self-pay

## 2017-11-11 ENCOUNTER — Emergency Department (HOSPITAL_COMMUNITY): Payer: Medicare Other

## 2017-11-11 ENCOUNTER — Inpatient Hospital Stay (HOSPITAL_COMMUNITY)
Admission: EM | Admit: 2017-11-11 | Discharge: 2017-11-17 | DRG: 871 | Disposition: A | Discharge status: Home Health | Payer: Medicare Other | Attending: Internal Medicine | Admitting: Internal Medicine

## 2017-11-11 ENCOUNTER — Encounter (HOSPITAL_COMMUNITY): Payer: Self-pay | Admitting: *Deleted

## 2017-11-11 DIAGNOSIS — Z66 Do not resuscitate: Secondary | ICD-10-CM | POA: Diagnosis present

## 2017-11-11 DIAGNOSIS — I69351 Hemiplegia and hemiparesis following cerebral infarction affecting right dominant side: Secondary | ICD-10-CM

## 2017-11-11 DIAGNOSIS — A419 Sepsis, unspecified organism: Secondary | ICD-10-CM | POA: Diagnosis present

## 2017-11-11 DIAGNOSIS — J181 Lobar pneumonia, unspecified organism: Secondary | ICD-10-CM | POA: Diagnosis not present

## 2017-11-11 DIAGNOSIS — J690 Pneumonitis due to inhalation of food and vomit: Secondary | ICD-10-CM | POA: Diagnosis present

## 2017-11-11 DIAGNOSIS — Z6827 Body mass index (BMI) 27.0-27.9, adult: Secondary | ICD-10-CM | POA: Diagnosis not present

## 2017-11-11 DIAGNOSIS — Z515 Encounter for palliative care: Secondary | ICD-10-CM | POA: Diagnosis present

## 2017-11-11 DIAGNOSIS — IMO0002 Reserved for concepts with insufficient information to code with codable children: Secondary | ICD-10-CM | POA: Diagnosis present

## 2017-11-11 DIAGNOSIS — I129 Hypertensive chronic kidney disease with stage 1 through stage 4 chronic kidney disease, or unspecified chronic kidney disease: Secondary | ICD-10-CM | POA: Diagnosis present

## 2017-11-11 DIAGNOSIS — G459 Transient cerebral ischemic attack, unspecified: Secondary | ICD-10-CM | POA: Diagnosis not present

## 2017-11-11 DIAGNOSIS — Z7401 Bed confinement status: Secondary | ICD-10-CM

## 2017-11-11 DIAGNOSIS — Z823 Family history of stroke: Secondary | ICD-10-CM

## 2017-11-11 DIAGNOSIS — Z888 Allergy status to other drugs, medicaments and biological substances status: Secondary | ICD-10-CM

## 2017-11-11 DIAGNOSIS — F0280 Dementia in other diseases classified elsewhere without behavioral disturbance: Secondary | ICD-10-CM | POA: Diagnosis present

## 2017-11-11 DIAGNOSIS — Z9849 Cataract extraction status, unspecified eye: Secondary | ICD-10-CM

## 2017-11-11 DIAGNOSIS — Y95 Nosocomial condition: Secondary | ICD-10-CM | POA: Diagnosis present

## 2017-11-11 DIAGNOSIS — Z8673 Personal history of transient ischemic attack (TIA), and cerebral infarction without residual deficits: Secondary | ICD-10-CM | POA: Diagnosis not present

## 2017-11-11 DIAGNOSIS — E1165 Type 2 diabetes mellitus with hyperglycemia: Secondary | ICD-10-CM | POA: Diagnosis present

## 2017-11-11 DIAGNOSIS — L89620 Pressure ulcer of left heel, unstageable: Secondary | ICD-10-CM | POA: Diagnosis present

## 2017-11-11 DIAGNOSIS — E1122 Type 2 diabetes mellitus with diabetic chronic kidney disease: Secondary | ICD-10-CM | POA: Diagnosis present

## 2017-11-11 DIAGNOSIS — L89152 Pressure ulcer of sacral region, stage 2: Secondary | ICD-10-CM | POA: Diagnosis present

## 2017-11-11 DIAGNOSIS — E785 Hyperlipidemia, unspecified: Secondary | ICD-10-CM | POA: Diagnosis present

## 2017-11-11 DIAGNOSIS — D631 Anemia in chronic kidney disease: Secondary | ICD-10-CM | POA: Diagnosis present

## 2017-11-11 DIAGNOSIS — T3695XD Adverse effect of unspecified systemic antibiotic, subsequent encounter: Secondary | ICD-10-CM | POA: Diagnosis not present

## 2017-11-11 DIAGNOSIS — F0390 Unspecified dementia without behavioral disturbance: Secondary | ICD-10-CM | POA: Diagnosis not present

## 2017-11-11 DIAGNOSIS — Z7189 Other specified counseling: Secondary | ICD-10-CM | POA: Diagnosis not present

## 2017-11-11 DIAGNOSIS — N183 Chronic kidney disease, stage 3 (moderate): Secondary | ICD-10-CM | POA: Diagnosis present

## 2017-11-11 DIAGNOSIS — I10 Essential (primary) hypertension: Secondary | ICD-10-CM | POA: Diagnosis present

## 2017-11-11 DIAGNOSIS — Z7902 Long term (current) use of antithrombotics/antiplatelets: Secondary | ICD-10-CM

## 2017-11-11 DIAGNOSIS — Z8249 Family history of ischemic heart disease and other diseases of the circulatory system: Secondary | ICD-10-CM

## 2017-11-11 DIAGNOSIS — J189 Pneumonia, unspecified organism: Secondary | ICD-10-CM | POA: Diagnosis present

## 2017-11-11 DIAGNOSIS — M7989 Other specified soft tissue disorders: Secondary | ICD-10-CM | POA: Diagnosis not present

## 2017-11-11 DIAGNOSIS — Z833 Family history of diabetes mellitus: Secondary | ICD-10-CM

## 2017-11-11 DIAGNOSIS — I82612 Acute embolism and thrombosis of superficial veins of left upper extremity: Secondary | ICD-10-CM | POA: Diagnosis not present

## 2017-11-11 DIAGNOSIS — G40109 Localization-related (focal) (partial) symptomatic epilepsy and epileptic syndromes with simple partial seizures, not intractable, without status epilepticus: Secondary | ICD-10-CM | POA: Diagnosis present

## 2017-11-11 DIAGNOSIS — G3183 Dementia with Lewy bodies: Secondary | ICD-10-CM | POA: Diagnosis not present

## 2017-11-11 DIAGNOSIS — R7989 Other specified abnormal findings of blood chemistry: Secondary | ICD-10-CM

## 2017-11-11 DIAGNOSIS — E860 Dehydration: Secondary | ICD-10-CM | POA: Diagnosis present

## 2017-11-11 DIAGNOSIS — K521 Toxic gastroenteritis and colitis: Secondary | ICD-10-CM | POA: Diagnosis present

## 2017-11-11 DIAGNOSIS — Z87891 Personal history of nicotine dependence: Secondary | ICD-10-CM

## 2017-11-11 DIAGNOSIS — R059 Cough, unspecified: Secondary | ICD-10-CM

## 2017-11-11 DIAGNOSIS — Z87440 Personal history of urinary (tract) infections: Secondary | ICD-10-CM

## 2017-11-11 DIAGNOSIS — Z794 Long term (current) use of insulin: Secondary | ICD-10-CM

## 2017-11-11 DIAGNOSIS — D649 Anemia, unspecified: Secondary | ICD-10-CM | POA: Diagnosis not present

## 2017-11-11 DIAGNOSIS — Z8701 Personal history of pneumonia (recurrent): Secondary | ICD-10-CM

## 2017-11-11 DIAGNOSIS — E43 Unspecified severe protein-calorie malnutrition: Secondary | ICD-10-CM | POA: Diagnosis present

## 2017-11-11 DIAGNOSIS — R05 Cough: Secondary | ICD-10-CM

## 2017-11-11 DIAGNOSIS — Z8669 Personal history of other diseases of the nervous system and sense organs: Secondary | ICD-10-CM

## 2017-11-11 DIAGNOSIS — R609 Edema, unspecified: Secondary | ICD-10-CM | POA: Diagnosis not present

## 2017-11-11 DIAGNOSIS — R627 Adult failure to thrive: Secondary | ICD-10-CM | POA: Diagnosis present

## 2017-11-11 DIAGNOSIS — M79609 Pain in unspecified limb: Secondary | ICD-10-CM | POA: Diagnosis not present

## 2017-11-11 DIAGNOSIS — Z79899 Other long term (current) drug therapy: Secondary | ICD-10-CM

## 2017-11-11 LAB — URINALYSIS, ROUTINE W REFLEX MICROSCOPIC
BILIRUBIN URINE: NEGATIVE
Bacteria, UA: NONE SEEN
Bilirubin Urine: NEGATIVE
Glucose, UA: >=500 mg/dL — AB
Hgb urine dipstick: NEGATIVE
Hgb urine dipstick: NEGATIVE
Ketones, ur: NEGATIVE mg/dL
Ketones, ur: 5 mg/dL — AB
NITRITE: NEGATIVE
Nitrite: NEGATIVE
PH: 5 (ref 5.0–8.0)
Protein, ur: NEGATIVE mg/dL
Protein, ur: NEGATIVE mg/dL
SQUAMOUS EPITHELIAL / LPF: NONE SEEN
Specific Gravity, Urine: 1.017 (ref 1.005–1.030)
Specific Gravity, Urine: 1.02 (ref 1.005–1.030)
pH: 5 (ref 5.0–8.0)

## 2017-11-11 LAB — LACTIC ACID, PLASMA
LACTIC ACID, VENOUS: 1.6 mmol/L (ref 0.5–1.9)
LACTIC ACID, VENOUS: 2.9 mmol/L — AB (ref 0.5–1.9)

## 2017-11-11 LAB — COMPREHENSIVE METABOLIC PANEL
ALK PHOS: 113 U/L (ref 38–126)
ALT: 10 U/L — AB (ref 14–54)
AST: 19 U/L (ref 15–41)
Albumin: 2.4 g/dL — ABNORMAL LOW (ref 3.5–5.0)
Anion gap: 11 (ref 5–15)
BUN: 35 mg/dL — ABNORMAL HIGH (ref 6–20)
CALCIUM: 9.8 mg/dL (ref 8.9–10.3)
CO2: 22 mmol/L (ref 22–32)
CREATININE: 0.92 mg/dL (ref 0.44–1.00)
Chloride: 108 mmol/L (ref 101–111)
GFR, EST NON AFRICAN AMERICAN: 52 mL/min — AB (ref >60)
Glucose, Bld: 242 mg/dL — ABNORMAL HIGH (ref 65–99)
Potassium: 5.1 mmol/L (ref 3.5–5.1)
Sodium: 141 mmol/L (ref 135–145)
TOTAL PROTEIN: 7.7 g/dL (ref 6.5–8.1)
Total Bilirubin: 0.1 mg/dL — ABNORMAL LOW (ref 0.3–1.2)

## 2017-11-11 LAB — CBC WITH DIFFERENTIAL/PLATELET
BASOS ABS: 0 10*3/uL (ref 0.0–0.1)
BASOS PCT: 0 %
EOS PCT: 1 %
Eosinophils Absolute: 0.2 10*3/uL (ref 0.0–0.7)
HCT: 38.2 % (ref 36.0–46.0)
Hemoglobin: 11.9 g/dL — ABNORMAL LOW (ref 12.0–15.0)
Lymphocytes Relative: 23 %
Lymphs Abs: 3.1 10*3/uL (ref 0.7–4.0)
MCH: 28.6 pg (ref 26.0–34.0)
MCHC: 31.2 g/dL (ref 30.0–36.0)
MCV: 91.8 fL (ref 78.0–100.0)
MONO ABS: 0.9 10*3/uL (ref 0.1–1.0)
MONOS PCT: 6 %
Neutro Abs: 9.3 10*3/uL — ABNORMAL HIGH (ref 1.7–7.7)
Neutrophils Relative %: 70 %
PLATELETS: 468 10*3/uL — AB (ref 150–400)
RBC: 4.16 MIL/uL (ref 3.87–5.11)
RDW: 18.7 % — AB (ref 11.5–15.5)
WBC: 13.4 10*3/uL — ABNORMAL HIGH (ref 4.0–10.5)

## 2017-11-11 LAB — I-STAT CG4 LACTIC ACID, ED: LACTIC ACID, VENOUS: 2.45 mmol/L — AB (ref 0.5–1.9)

## 2017-11-11 LAB — VALPROIC ACID LEVEL: VALPROIC ACID LVL: 23 ug/mL — AB (ref 50.0–100.0)

## 2017-11-11 LAB — C DIFFICILE QUICK SCREEN W PCR REFLEX
C Diff antigen: NEGATIVE
C Diff interpretation: NOT DETECTED
C Diff toxin: NEGATIVE

## 2017-11-11 LAB — CBC
HCT: 32.4 % — ABNORMAL LOW (ref 36.0–46.0)
Hemoglobin: 9.9 g/dL — ABNORMAL LOW (ref 12.0–15.0)
MCH: 28.2 pg (ref 26.0–34.0)
MCHC: 30.6 g/dL (ref 30.0–36.0)
MCV: 92.3 fL (ref 78.0–100.0)
PLATELETS: 413 10*3/uL — AB (ref 150–400)
RBC: 3.51 MIL/uL — AB (ref 3.87–5.11)
RDW: 18.7 % — AB (ref 11.5–15.5)
WBC: 13.3 10*3/uL — AB (ref 4.0–10.5)

## 2017-11-11 LAB — CREATININE, SERUM
CREATININE: 0.85 mg/dL (ref 0.44–1.00)
GFR calc non Af Amer: 57 mL/min — ABNORMAL LOW (ref >60)

## 2017-11-11 LAB — CALCIUM: CALCIUM: 8.6 mg/dL — AB (ref 8.9–10.3)

## 2017-11-11 LAB — BRAIN NATRIURETIC PEPTIDE: B Natriuretic Peptide: 77.6 pg/mL (ref 0.0–100.0)

## 2017-11-11 LAB — MAGNESIUM: Magnesium: 1.9 mg/dL (ref 1.7–2.4)

## 2017-11-11 LAB — PROTIME-INR
INR: 1.25
PROTHROMBIN TIME: 15.6 s — AB (ref 11.4–15.2)

## 2017-11-11 LAB — GLUCOSE, CAPILLARY: Glucose-Capillary: 173 mg/dL — ABNORMAL HIGH (ref 65–99)

## 2017-11-11 LAB — APTT: aPTT: 29 seconds (ref 24–36)

## 2017-11-11 LAB — PROCALCITONIN: Procalcitonin: <0.1 ng/mL

## 2017-11-11 LAB — PHOSPHORUS: Phosphorus: 2.4 mg/dL — ABNORMAL LOW (ref 2.5–4.6)

## 2017-11-11 MED ORDER — ZINC OXIDE 11.3 % EX CREA
1.0000 "application " | TOPICAL_CREAM | Freq: Every day | CUTANEOUS | Status: DC
  Administered 2017-11-12 – 2017-11-17 (×6): 1 via TOPICAL
  Filled 2017-11-11: qty 56

## 2017-11-11 MED ORDER — MELATONIN 3 MG PO TABS: 3.0000 mg | ORAL_TABLET | Freq: Every evening | ORAL | Status: DC | PRN

## 2017-11-11 MED ORDER — TRAZODONE HCL 50 MG PO TABS: 50.0000 mg | ORAL_TABLET | Freq: Every evening | ORAL | Status: DC | PRN

## 2017-11-11 MED ORDER — BACID PO TABS
2.0000 | ORAL_TABLET | Freq: Three times a day (TID) | ORAL | Status: DC
  Administered 2017-11-13 – 2017-11-17 (×13): 2 via ORAL
  Filled 2017-11-11 (×20): qty 2

## 2017-11-11 MED ORDER — DIVALPROEX SODIUM ER 500 MG PO TB24: 500.0000 mg | ORAL_TABLET | Freq: Every day | ORAL | Status: DC

## 2017-11-11 MED ORDER — SODIUM CHLORIDE 0.9 % IV SOLN
2.0000 g | Freq: Once | INTRAVENOUS | Status: AC
  Administered 2017-11-11: 2 g via INTRAVENOUS
  Filled 2017-11-11: qty 2

## 2017-11-11 MED ORDER — VANCOMYCIN HCL IN DEXTROSE 1-5 GM/200ML-% IV SOLN
1000.0000 mg | Freq: Once | INTRAVENOUS | Status: AC
  Administered 2017-11-11: 1000 mg via INTRAVENOUS
  Filled 2017-11-11: qty 200

## 2017-11-11 MED ORDER — ROSUVASTATIN CALCIUM 5 MG PO TABS
5.0000 mg | ORAL_TABLET | ORAL | Status: DC
  Administered 2017-11-14 – 2017-11-16 (×2): 5 mg via ORAL
  Filled 2017-11-11 (×2): qty 1

## 2017-11-11 MED ORDER — HEPARIN SODIUM (PORCINE) 5000 UNIT/ML IJ SOLN
5000.0000 [IU] | Freq: Three times a day (TID) | INTRAMUSCULAR | Status: DC
  Administered 2017-11-11 – 2017-11-17 (×18): 5000 [IU] via SUBCUTANEOUS
  Filled 2017-11-11 (×18): qty 1

## 2017-11-11 MED ORDER — ONDANSETRON HCL 4 MG/2ML IJ SOLN
4.0000 mg | Freq: Four times a day (QID) | INTRAMUSCULAR | Status: DC | PRN
  Administered 2017-11-14: 4 mg via INTRAVENOUS

## 2017-11-11 MED ORDER — CLOPIDOGREL BISULFATE 75 MG PO TABS
75.0000 mg | ORAL_TABLET | Freq: Every day | ORAL | Status: DC
  Administered 2017-11-14 – 2017-11-17 (×4): 75 mg via ORAL
  Filled 2017-11-11 (×4): qty 1

## 2017-11-11 MED ORDER — VITAMIN C 500 MG PO TABS
500.0000 mg | ORAL_TABLET | Freq: Every day | ORAL | Status: DC
  Administered 2017-11-14 – 2017-11-17 (×4): 500 mg via ORAL
  Filled 2017-11-11 (×4): qty 1

## 2017-11-11 MED ORDER — HYDROCODONE-ACETAMINOPHEN 5-325 MG PO TABS: 1.0000 | ORAL_TABLET | ORAL | Status: DC | PRN

## 2017-11-11 MED ORDER — ALBUTEROL SULFATE (2.5 MG/3ML) 0.083% IN NEBU: 2.5000 mg | INHALATION_SOLUTION | RESPIRATORY_TRACT | Status: DC | PRN

## 2017-11-11 MED ORDER — LACTATED RINGERS IV BOLUS (SEPSIS): 500.0000 mL | Freq: Once | INTRAVENOUS | Status: DC

## 2017-11-11 MED ORDER — MEMANTINE HCL ER 14 MG PO CP24
14.0000 mg | ORAL_CAPSULE | Freq: Every day | ORAL | Status: DC
  Administered 2017-11-13 – 2017-11-16 (×4): 14 mg via ORAL
  Filled 2017-11-11 (×7): qty 1

## 2017-11-11 MED ORDER — PRO-STAT SUGAR FREE PO LIQD
30.0000 mL | Freq: Three times a day (TID) | ORAL | Status: DC
  Administered 2017-11-13 – 2017-11-17 (×16): 30 mL via ORAL
  Filled 2017-11-11 (×16): qty 30

## 2017-11-11 MED ORDER — ACETAMINOPHEN 650 MG RE SUPP: 650.0000 mg | Freq: Four times a day (QID) | RECTAL | Status: DC | PRN

## 2017-11-11 MED ORDER — SODIUM CHLORIDE 0.9 % IV SOLN
1.0000 g | INTRAVENOUS | Status: DC
  Administered 2017-11-12: 1 g via INTRAVENOUS
  Filled 2017-11-11: qty 1

## 2017-11-11 MED ORDER — LACTATED RINGERS IV BOLUS (SEPSIS)
1000.0000 mL | Freq: Once | INTRAVENOUS | Status: AC
  Administered 2017-11-11: 1000 mL via INTRAVENOUS

## 2017-11-11 MED ORDER — KETOROLAC TROMETHAMINE 15 MG/ML IJ SOLN: 15.0000 mg | Freq: Four times a day (QID) | INTRAMUSCULAR | Status: AC | PRN

## 2017-11-11 MED ORDER — ONDANSETRON HCL 4 MG PO TABS
4.0000 mg | ORAL_TABLET | Freq: Four times a day (QID) | ORAL | Status: DC | PRN
  Filled 2017-11-11: qty 1

## 2017-11-11 MED ORDER — LEVALBUTEROL HCL 0.63 MG/3ML IN NEBU: 0.6300 mg | INHALATION_SOLUTION | Freq: Four times a day (QID) | RESPIRATORY_TRACT | Status: DC | PRN

## 2017-11-11 MED ORDER — ACETAMINOPHEN 325 MG PO TABS: 650.0000 mg | ORAL_TABLET | Freq: Four times a day (QID) | ORAL | Status: DC | PRN

## 2017-11-11 MED ORDER — ONDANSETRON HCL 4 MG/2ML IJ SOLN
4.0000 mg | Freq: Once | INTRAMUSCULAR | Status: AC
  Administered 2017-11-11: 4 mg via INTRAVENOUS
  Filled 2017-11-11: qty 2

## 2017-11-11 MED ORDER — SODIUM CHLORIDE 0.9 % IV BOLUS (SEPSIS)
1000.0000 mL | Freq: Once | INTRAVENOUS | Status: AC
  Administered 2017-11-11: 1000 mL via INTRAVENOUS

## 2017-11-11 MED ORDER — SORBITOL 70 % SOLN
30.0000 mL | Freq: Every day | Status: DC | PRN
  Filled 2017-11-11: qty 30

## 2017-11-11 MED ORDER — ONDANSETRON HCL 4 MG/2ML IJ SOLN
4.0000 mg | Freq: Four times a day (QID) | INTRAMUSCULAR | Status: DC | PRN
  Filled 2017-11-11: qty 2

## 2017-11-11 MED ORDER — SODIUM CHLORIDE 1 G PO TABS
1.0000 g | ORAL_TABLET | Freq: Every day | ORAL | Status: DC
  Administered 2017-11-14 – 2017-11-17 (×4): 1 g via ORAL
  Filled 2017-11-11 (×7): qty 1

## 2017-11-11 MED ORDER — MAGNESIUM CITRATE PO SOLN: 1.0000 | Freq: Once | ORAL | Status: DC | PRN

## 2017-11-11 MED ORDER — PREMIER PROTEIN SHAKE
2.0000 [oz_av] | Freq: Three times a day (TID) | ORAL | Status: DC
  Administered 2017-11-13 – 2017-11-17 (×9): 2 [oz_av] via ORAL
  Filled 2017-11-11 (×32): qty 325.31

## 2017-11-11 MED ORDER — VANCOMYCIN HCL IN DEXTROSE 1-5 GM/200ML-% IV SOLN
1000.0000 mg | INTRAVENOUS | Status: DC
  Administered 2017-11-12: 1000 mg via INTRAVENOUS
  Filled 2017-11-11: qty 200

## 2017-11-11 MED ORDER — LOPERAMIDE HCL 2 MG PO CAPS
2.0000 mg | ORAL_CAPSULE | ORAL | Status: DC | PRN
  Administered 2017-11-15 – 2017-11-16 (×3): 2 mg via ORAL
  Filled 2017-11-11 (×3): qty 1

## 2017-11-11 MED ORDER — DOCUSATE SODIUM 100 MG PO CAPS
100.0000 mg | ORAL_CAPSULE | Freq: Two times a day (BID) | ORAL | Status: DC
  Administered 2017-11-13: 100 mg via ORAL
  Filled 2017-11-11 (×2): qty 1

## 2017-11-11 MED ORDER — SENNOSIDES-DOCUSATE SODIUM 8.6-50 MG PO TABS: 1.0000 | ORAL_TABLET | Freq: Every evening | ORAL | Status: DC | PRN

## 2017-11-11 MED ORDER — NYSTATIN 100000 UNIT/GM EX POWD
Freq: Two times a day (BID) | CUTANEOUS | Status: DC
  Administered 2017-11-11 – 2017-11-12 (×2): via TOPICAL
  Administered 2017-11-12: 1 g via TOPICAL
  Administered 2017-11-13: 23:00:00 via TOPICAL
  Administered 2017-11-13: 1 g via TOPICAL
  Administered 2017-11-14 – 2017-11-17 (×7): via TOPICAL
  Filled 2017-11-11: qty 15

## 2017-11-11 MED ORDER — VALPROATE SODIUM 500 MG/5ML IV SOLN
250.0000 mg | Freq: Two times a day (BID) | INTRAVENOUS | Status: DC
  Administered 2017-11-11 – 2017-11-15 (×8): 250 mg via INTRAVENOUS
  Filled 2017-11-11 (×9): qty 2.5

## 2017-11-11 MED ORDER — ADULT MULTIVITAMIN W/MINERALS CH
1.0000 | ORAL_TABLET | Freq: Every day | ORAL | Status: DC
  Administered 2017-11-14 – 2017-11-17 (×4): 1 via ORAL
  Filled 2017-11-11 (×4): qty 1

## 2017-11-11 MED ORDER — SODIUM CHLORIDE 0.9 % IV SOLN
INTRAVENOUS | Status: DC
  Administered 2017-11-11: 17:00:00 via INTRAVENOUS

## 2017-11-11 MED ORDER — ASPIRIN EC 81 MG PO TBEC
81.0000 mg | DELAYED_RELEASE_TABLET | Freq: Every day | ORAL | Status: DC
  Administered 2017-11-14 – 2017-11-16 (×3): 81 mg via ORAL
  Filled 2017-11-11 (×3): qty 1

## 2017-11-11 MED ORDER — SODIUM CHLORIDE 0.9 % IV BOLUS (SEPSIS)
1250.0000 mL | Freq: Once | INTRAVENOUS | Status: AC
  Administered 2017-11-11: 1250 mL via INTRAVENOUS

## 2017-11-11 MED ORDER — FERROUS SULFATE 325 (65 FE) MG PO TABS
325.0000 mg | ORAL_TABLET | Freq: Every day | ORAL | Status: DC
  Administered 2017-11-14 – 2017-11-17 (×4): 325 mg via ORAL
  Filled 2017-11-11 (×4): qty 1

## 2017-11-11 MED ORDER — SODIUM CHLORIDE 0.9 % IV SOLN: 1.0000 g | Freq: Two times a day (BID) | INTRAVENOUS | Status: DC

## 2017-11-11 NOTE — ED Notes (Signed)
Dr Preston FleetingGlick informed of pt lactic acid 2.45

## 2017-11-11 NOTE — Progress Notes (Signed)
Pharmacy Antibiotic Note  Omar PersonRuby Massimino is a 84 y.o. female admitted on 11/11/2017 with sepsis possibly from aspiration pna.  Pharmacy has been consulted for dosing. Patient received a dose of vancomycin in the ED. WBC elevated at 13.4. SCr 0.92 (BL ~ 0.7). CrCl ~ 30-35 mL/min.   Plan: -Vancomycin 1 gm IV Q 24 hours -Decrease Cefepime to 1 gm IV Q 24 hours  -Monitor CBC, renal fx, cultures and clinical progress -VT at SS     Temp (24hrs), Avg:97.6 F (36.4 C), Min:97.6 F (36.4 C), Max:97.6 F (36.4 C)  Recent Labs  Lab 11/11/17 0600 11/11/17 0609  WBC 13.4*  --   CREATININE 0.92  --   LATICACIDVEN  --  2.45*    Estimated Creatinine Clearance: 34.3 mL/min (by C-G formula based on SCr of 0.92 mg/dL).    Allergies  Allergen Reactions  . Lipitor [Atorvastatin] Other (See Comments)    Myalgias     Antimicrobials this admission: Cefepime 3/22 >>  Vanc 3/22 >>   Dose adjustments this admission: None   Microbiology results: 3/22 BCx:    Thank you for allowing pharmacy to be a part of this patient's care.  Vinnie LevelBenjamin Dwan Hemmelgarn, PharmD., BCPS Clinical Pharmacist Clinical phone for 11/11/17 until 3:30pm: [phone] If after 3:30pm, please call main pharmacy at: [phone]

## 2017-11-11 NOTE — ED Notes (Signed)
Notified pharmacy for 2nd set of cultures and lactic.

## 2017-11-11 NOTE — Progress Notes (Addendum)
Inpatient Diabetes Program Recommendations  AACE/ADA: New Consensus Statement on Inpatient Glycemic Control (2015)  Target Ranges:  Prepandial:   less than 140 mg/dL      Peak postprandial:   less than 180 mg/dL (1-2 hours)      Critically ill patients:  140 - 180 mg/dL   Lab Results  Component Value Date   GLUCAP 160 (H) 11/04/2017   HGBA1C 8.6 (H) 11/01/2017   Review of Glycemic Control  Diabetes history: DM 2 Outpatient Diabetes medications: Toujeo 18 units qpm Current orders for Inpatient glycemic control: None ordered or in sign and held orders  Inpatient Diabetes Program Recommendations:    Noted regular diet in sign and held orders. Please change diet from regular to carb modified.  Glucose currently 242 mg/dl ijn labs this am. Consider ordering Lantus 6-8 units (0.1 units/kg), and Novolog Sensitive Correction 0-9 units tid + Novolog HS scale 0-5 units.  A1c 8.6% on 11/01/17  Thanks,  Megan DeemShannon Julina Altmann RN, MSN, BC-ADM, [facility] Inpatient Diabetes Coordinator Team Pager [phone] (8a-5p)

## 2017-11-11 NOTE — Consult Note (Signed)
WOC Nurse wound consult note Reason for Consult: sacrum [facility] from home [facility] Patient's daughters care for her at home.  Wound type:  Healed pressure injuries; bilateral buttocks; intact skin Unstageable pressure injury: left heel Pressure Injury POA: Yes Measurement: Left heel: 0.5cm x 0.5cm x 0cm  Wound bed: Left heel 100% eschar but surrounding tissue appears to have healed Drainage (amount, consistency, odor) none Periwound:intact  Dressing procedure/placement/frequency: Female external urine containment device in place for moisture management Add Prevalon boots bilaterally for vunerable heels, explained rationale for use family    Discussed POC with patient and bedside nurse.  Re consult if needed, will not follow at this time. Thanks  Akeyla Molden M.D.C. [employer] MSN, RN,CWOCN, CNS, CWON-AP [phone])

## 2017-11-11 NOTE — ED Provider Notes (Signed)
[facility] EMERGENCY DEPARTMENT Provider Note   CSN: [id number] Arrival date & time: 11/11/17  0505     History   Chief Complaint Chief Complaint  Patient presents with  . Emesis    HPI Megan [name] is a 84 y.o. female.  The history is provided by a relative. The history is limited by the condition of the patient (Altered mental status).  Emesis    She has history of diabetes, hyperlipidemia, stroke, seizure disorder and was brought in by ambulance following 2 episodes of emesis at home.  Family states that she had been discharged from the hospital 1 week ago with urinary tract infection and pneumonia.  She had been having ongoing problems with diarrhea at home so antibiotics were discontinued.  Stool had been sent for Clostridium difficile and came back negative.  Since going home, she has had anorexia.  There is been no fever or chills.  She has been more somnolent than normal.  Tonight, she woke up vomiting.  Family is concerned that she may have aspirated.  She vomited twice at home.  Past Medical History:  Diagnosis Date  . Diabetes mellitus   . Hyperlipidemia   . Seizure (HCC)   . Stroke [facility]) 2012   possible stroke Jan 2016  . UTI (urinary tract infection) 10/2017    Patient Active Problem List   Diagnosis Date Noted  . Increased ammonia level 11/01/2017  . Acute metabolic encephalopathy 11/01/2017  . HCAP (healthcare-associated pneumonia) 11/01/2017  . Pressure injury of skin 10/26/2017  . UTI (urinary tract infection) 10/26/2017  . Heel ulcer (HCC) 06/18/2017  . Unstageable pressure ulcer of sacral region (HCC) 06/18/2017  . Decubitus ulcer of sacral region, stage 2   . Idiopathic hypotension   . Seizure (HCC) 05/21/2017  . Hyponatremia 05/12/2017  . Acute renal failure superimposed on stage 3 chronic kidney disease (HCC) 10/23/2014  . Dehydration 10/23/2014  . Acute hyperkalemia 10/23/2014  . Abnormal urinalysis 10/23/2014  . Chest pain  04/24/2014  . Altered mental state 08/08/2013  . Dementia with Lewy bodies 08/07/2013  . Vascular dementia 08/07/2013  . Sleepiness 08/07/2013  . Anemia 07/21/2013  . Protein-calorie malnutrition, severe (HCC) 07/15/2013  . Herpes zoster 07/13/2013  . Spastic hemiplegia affecting dominant side (HCC) 06/25/2013  . History of CVA w/residual right side deficits 09/03/2012  . History of partial seizures   . TIA (transient ischemic attack) 01/06/2012  . Right upper lobe pneumonia (HCC) 01/06/2012  . Diabetes mellitus type II, uncontrolled (HCC) 01/06/2012  . HTN (hypertension) 01/06/2012  . H/O 01/06/2012    Past Surgical History:  Procedure Laterality Date  . CATARACT EXTRACTION      OB History   None      Home Medications    Prior to Admission medications   Medication Sig Start Date End Date Taking? Authorizing Provider  acetaminophen (TYLENOL) 325 MG tablet Take 2 tablets (650 mg total) by mouth every 6 (six) hours as needed for mild pain (or Fever >/= 101). 11/04/17   Alwyn Ren, MD  Amino Acids-Protein Hydrolys (FEEDING SUPPLEMENT, PRO-STAT SUGAR FREE 64,) LIQD Take 30 mLs by mouth 4 (four) times daily -  with meals and at bedtime. 10/28/17   Narda Bonds, MD  amoxicillin-clavulanate (AUGMENTIN) 500-125 MG tablet Take 1 tablet (500 mg total) by mouth 2 (two) times daily. 11/04/17   Alwyn Ren, MD  bisacodyl (DULCOLAX) 10 MG suppository Place 1 suppository (10 mg total) rectally daily as needed  for moderate constipation. 11/04/17   Alwyn RenMathews, Elizabeth G, MD  clopidogrel (PLAVIX) 75 MG tablet Take 1 tablet (75 mg total) by mouth daily with breakfast. 08/15/13   [name], John, MD  divalproex (DEPAKOTE ER) 500 MG 24 hr tablet Take 1 tablet (500 mg total) by mouth at bedtime. 07/14/16   Micki RileySethi, Pramod S, MD  famotidine (PEPCID) 20 MG tablet Take 1 tablet (20 mg total) by mouth daily. 10/29/17   Narda BondsNettey, Ralph A, MD  ferrous sulfate 325 (65 FE) MG tablet Take 325 mg by  mouth daily with breakfast.    [provider]  guaiFENesin (MUCINEX) 600 MG 12 hr tablet Take 600 mg by mouth 2 (two) times daily as needed for to loosen phlegm.    [provider]  lactulose (CHRONULAC) 10 GM/15ML solution Take 45 mLs (30 g total) by mouth 2 (two) times daily. 11/04/17   Alwyn RenMathews, Elizabeth G, MD  Melatonin 5 MG TABS Take 5 mg by mouth at bedtime as needed (sleep).    [provider]  memantine (NAMENDA XR) 14 MG CP24 24 hr capsule Take 14 mg by mouth daily.    [provider]  Multiple Vitamin (MULTIVITAMIN WITH MINERALS) TABS tablet Take 1 tablet by mouth daily. 11/04/17   Alwyn RenMathews, Elizabeth G, MD  NYSTATIN powder Apply 1 application topically 2 (two) times daily as needed for rash. 05/04/17   [provider]  polyethylene glycol (MIRALAX) packet Take 17 g by mouth daily. 10/28/17   Narda BondsNettey, Ralph A, MD  protein supplement shake (PREMIER PROTEIN) LIQD Take 59.1 mLs (2 oz total) by mouth 4 (four) times daily -  with meals and at bedtime. 10/28/17   Narda BondsNettey, Ralph A, MD  rosuvastatin (CRESTOR) 5 MG tablet Take 5 mg by mouth every Monday, Wednesday, and Friday.    [provider]  sodium chloride 1 g tablet Take 1 g by mouth daily.    [provider]  TOUJEO SOLOSTAR 300 UNIT/ML SOPN Inject 14 Units as directed every evening. Patient taking differently: Inject 18 Units as directed every evening.  06/21/17   Lonia BloodMcClung, Jeffrey T, MD  vitamin C (ASCORBIC ACID) 500 MG tablet Take 500 mg by mouth daily.    [provider]  zinc oxide (BALMEX) 11.3 % CREA cream Apply 1 application topically daily as needed (for rash).     [provider]    Family History Family History  Problem Relation Age of Onset  . CAD Mother   . Hypertension Mother   . Diabetes Mother   . Stroke Mother     Social History Social History   Tobacco Use  . Smoking status: Never Smoker  . Smokeless tobacco: Former NeurosurgeonUser    Types: Snuff    . Tobacco comment: quit 50-60 years ago  Substance Use Topics  . Alcohol use: No    Alcohol/week: 0.0 oz  . Drug use: No     Allergies   Lipitor [atorvastatin]   Review of Systems Review of Systems  Unable to perform ROS: Mental status change  Gastrointestinal: Positive for vomiting.     Physical Exam Updated Vital Signs BP (!) 95/56   Pulse 92   Temp 97.6 F (36.4 C)   Resp 16   SpO2 96%   Physical Exam  Nursing note and vitals reviewed.  84 year old female, resting comfortably and in no acute distress. Vital signs are normal. Oxygen saturation is 96%, which is normal. Head is normocephalic and atraumatic. PERRLA,  EOMI. Oropharynx is clear. Neck is nontender and supple without adenopathy or JVD. Back is nontender and there is no CVA tenderness. Lungs are clear without rales, wheezes, or rhonchi. Chest is nontender. Heart has regular rate and rhythm without murmur. Abdomen is soft, flat, nontender without masses or hepatosplenomegaly and peristalsis is hypoactive. Extremities have no cyanosis or edema, full range of motion is present. Skin is warm and dry without rash. Neurologic: She is somnolent but arousable, cranial nerves are intact, there are no motor or sensory deficits.  ED Treatments / Results  Labs (all labs ordered are listed, but only abnormal results are displayed) Labs Reviewed  COMPREHENSIVE METABOLIC PANEL - Abnormal; Notable for the following components:      Result Value   Glucose, Bld 242 (*)    BUN 35 (*)    Albumin 2.4 (*)    ALT 10 (*)    Total Bilirubin 0.1 (*)    GFR calc non Af Amer 52 (*)    All other components within normal limits  CBC WITH DIFFERENTIAL/PLATELET - Abnormal; Notable for the following components:   WBC 13.4 (*)    Hemoglobin 11.9 (*)    RDW 18.7 (*)    Platelets 468 (*)    Neutro Abs 9.3 (*)    All other components within normal limits  URINALYSIS, ROUTINE W REFLEX MICROSCOPIC - Abnormal; Notable for the  following components:   Glucose, UA >=500 (*)    Leukocytes, UA TRACE (*)    All other components within normal limits  I-STAT CG4 LACTIC ACID, ED - Abnormal; Notable for the following components:   Lactic Acid, Venous 2.45 (*)    All other components within normal limits  CULTURE, BLOOD (ROUTINE X 2)  CULTURE, BLOOD (ROUTINE X 2)  LACTIC ACID, PLASMA  LACTIC ACID, PLASMA  PROCALCITONIN  PROTIME-INR  APTT  I-STAT CG4 LACTIC ACID, ED  I-STAT CG4 LACTIC ACID, ED    Radiology Dg Chest Port 1 View  Result Date: 11/11/2017 CLINICAL DATA:  Nausea and vomiting.  Possible aspiration. EXAM: PORTABLE CHEST 1 VIEW COMPARISON:  Radiograph 11/03/2017 FINDINGS: Progressive left basilar from prior exam, minimal right basilar opacity which is similar to mildly progressed. Resolved right upper lobe airspace opacity from prior. Overall low lung volumes. Unchanged aortic tortuosity. Heart size not well assessed given positioning. No pleural fluid or pneumothorax. Chronic change about the right shoulder. IMPRESSION: Progressive left basilar opacity. Right basilar opacity on prior is unchanged or possibly progressed. Given distribution, findings suggest aspiration. Resolved right upper lobe pneumonia. Electronically Signed   By: Rubye Oaks M.D.   On: 11/11/2017 06:02    Procedures Procedures  CRITICAL CARE Performed by: Dione Booze Total critical care time: 35 minutes Critical care time was exclusive of separately billable procedures and treating other patients. Critical care was necessary to treat or prevent imminent or life-threatening deterioration. Critical care was time spent personally by me on the following activities: development of treatment plan with patient and/or surrogate as well as nursing, discussions with consultants, evaluation of patient's response to treatment, examination of patient, obtaining history from patient or surrogate, ordering and performing treatments and interventions,  ordering and review of laboratory studies, ordering and review of radiographic studies, pulse oximetry and re-evaluation of patient's condition.  Medications Ordered in ED Medications  ondansetron (ZOFRAN) injection 4 mg (has no administration in time range)  sodium chloride 0.9 % bolus 1,000 mL (has no administration in time range)     Initial Impression /  Assessment and Plan / ED Course  I have reviewed the triage vital signs and the nursing notes.  Pertinent labs & imaging results that were available during my care of the patient were reviewed by me and considered in my medical decision making (see chart for details).  Emesis with possible aspiration.  Will check chest x-ray.  Antibiotic associated diarrhea secondary to Augmentin.  Old records are reviewed confirming recent hospitalization for pneumonia and urinary tract infection.  Of note, she was DNR on discharge.  I have confirmed with family that that is correct.  Will check urinalysis for evidence of ongoing UTI.  We will also check screening labs.  She is given IV fluids and ondansetron.  Lactic acid is mildly elevated.  X-rays consistent with aspiration.  There is concern that pulmonary function will get worse with time with recent aspiration.  She is started on antibiotics for healthcare associated pneumonia and is given early, goal directed fluid management for her elevated lactic acid level.  I doubt this is truly sepsis, but is being treated as such.  Case is discussed with Dr. Flossie Dibble of [facility], who agrees to admit the patient.  Final Clinical Impressions(s) / ED Diagnoses   Final diagnoses:  HCAP (healthcare-associated pneumonia)  Aspiration pneumonia due to gastric secretions, unspecified laterality, unspecified part of lung (HCC)  Elevated lactic acid level    ED Discharge Orders    None       Dione Booze, MD 11/11/17 [phone]

## 2017-11-11 NOTE — Progress Notes (Signed)
Palliative Medicine consult noted. Due to high referral volume, there may be a delay seeing this patient. Please call the Palliative Medicine Team office at [phone] if recommendations are needed in the interim.  Thank you for inviting us to see this patient.  Margret ChanceMelanie G. Zeta Bucy, RN, BSN, [facility] Palliative Medicine Team 11/11/2017 12:36 PM Office [phone]

## 2017-11-11 NOTE — ED Notes (Signed)
Paged admitting per RN  

## 2017-11-11 NOTE — Consult Note (Signed)
Consultation Note Date: 11/11/2017   Patient Name: Megan [name]  DOB: 04/17/1922  MRN: [id number]  Age / Sex: 84 y.o., female  PCP: Lavone Orn, MD Referring Physician: Deatra James, MD  Reason for Consultation: Establishing goals of care  HPI/Patient Profile: 84 y.o. female  with past medical history of CVA w/ R side weakness, seizures, debility, HLD, T2DM, HTN, and dementia admitted on 11/11/2017 with N/V/D and AMS. CXR revealed aspiration pna. Patient recently admitted 3/12-3/15 with pneumonia. She completed IV antibiotics and was subsequently placed on flagyl for colitis. C. Diff pending. Patient lives at home with daughter, Megan [name]. Of note, this is 5th admission in 6 months.  PMT consult for [place] d/t continued decline and poor prognosis.   Clinical Assessment and Goals of Care: I have reviewed medical records including EPIC notes, labs and imaging, assessed the patient and then met at the bedside along with patient's daughter, Megan [name],  to discuss diagnosis prognosis, [place], EOL wishes, disposition and options.  Patient is unable to participate in conversation d/t AMS.   [name] asked me to call patient's other daughter, Megan [name], to speak with Megan [name] about patient's status. She reports Megan [name] knows more about patient's medical history and patient lives with Megan [name]. Megan [name] is HCPOA. Melina Modena and had short discussion about patient.  I introduced Palliative Medicine as specialized medical care for people living with serious illness. It focuses on providing relief from the symptoms and stress of a serious illness. The goal is to improve quality of life for both the patient and the family.  Megan [name] tells me about a significant decline she has seen in patient. She is no longer ambulatory and hasn't been for months. She was able to stand and pivot with maximal assistance. For the past week, she has been bed bound. She no longer speaks but Megan [name] tells me they  communicate through writing notes. The patient spends most of her time watching TV. Megan [name] also describes a decline in appetite. She mostly consumes small amounts of pureed foods or protein drinks. Over the past week, she has had difficulty to get her to drink/eat anything. Megan [name] had a SLP come to their home 2 days ago to evaluate patient because she was coughing while eating or drinking. Megan [name] tells me SLP reported a risk of aspiration, delayed swallowing, and pocketing food.   We discussed the patient's multiple hospitalizations and overall failure to thrive. Megan [name] seems to have good understanding of her mother's medical state. For now, she wants to continue with DNR status but pursue other aggressive treatments.  [facility] meeting setup for Sunday 3/24 with Megan [name].    Primary Decision Maker HCPOA - daughter, Megan [name] OF RECOMMENDATIONS   - DNR/DNI - aggressive treatment of pna - iv abx, fluids - [place] meeting planned for Sunday 3/24 with patient's daughter Megan [name]  Code Status/Advance Care Planning:  DNR  Palliative Prophylaxis:   Aspiration, Frequent Pain Assessment, Oral Care and Turn Reposition  Additional Recommendations (Limitations, Scope, Preferences):  Full Scope Treatment - exception of DNR  Prognosis:   Unable to determine  Discharge Planning: To Be Determined      Primary Diagnoses: Present on Admission: . Sepsis ([place]) . TIA (transient ischemic attack) . Right upper lobe pneumonia (Megan [name]) . Diabetes mellitus type II, uncontrolled (Megan [name]) . HTN (hypertension) . Protein-calorie malnutrition, severe (Megan [name]) . Anemia . Dementia with Lewy bodies   I have reviewed the medical record, interviewed the patient and family, and examined the patient. The following aspects  are pertinent.  Past Medical History:  Diagnosis Date  . Diabetes mellitus   . Hyperlipidemia   . Seizure (Megan [name])   . Stroke [facility]) 2012   possible stroke Jan 2016  . UTI (urinary tract infection) 10/2017    Social History   Socioeconomic History  . Marital status: Widowed    Spouse name: Not on file  . Number of children: 5  . Years of education: 63  . Highest education level: Not on file  Occupational History  . Not on file  Social Needs  . Financial resource strain: Not on file  . Food insecurity:    Worry: Not on file    Inability: Not on file  . Transportation needs:    Medical: Not on file    Non-medical: Not on file  Tobacco Use  . Smoking status: Never Smoker  . Smokeless tobacco: Former Systems developer    Types: Snuff  . Tobacco comment: quit 50-60 years ago  Substance and Sexual Activity  . Alcohol use: No    Alcohol/week: 0.0 oz  . Drug use: No  . Sexual activity: Not on file  Lifestyle  . Physical activity:    Days per week: Not on file    Minutes per session: Not on file  . Stress: Not on file  Relationships  . Social connections:    Talks on phone: Not on file    Gets together: Not on file    Attends religious service: Not on file    Active member of club or organization: Not on file    Attends meetings of clubs or organizations: Not on file    Relationship status: Not on file  Other Topics Concern  . Not on file  Social History Narrative   Patient is widowed with 5 children   Patient is right handed   Patient has a high school education   Patient drinks 2 cups daily   Family History  Problem Relation Age of Onset  . CAD Mother   . Hypertension Mother   . Diabetes Mother   . Stroke Mother    Scheduled Meds: . lactobacillus acidophilus  2 tablet Oral TID   Continuous Infusions: . [START ON 11/12/2017] ceFEPime (MAXIPIME) IV    . lactated ringers 1,000 mL (11/11/17 1401)  . valproate sodium Stopped (11/11/17 1046)  . [START ON 11/12/2017] vancomycin     PRN Meds:.ondansetron (ZOFRAN) IV Allergies  Allergen Reactions  . Lipitor [Atorvastatin] Other (See Comments)    Myalgias    Review of Systems  Unable to perform ROS: Mental status change     Physical Exam  Constitutional: No distress.  HENT:  Head: Normocephalic and atraumatic.  Cardiovascular: Normal rate.  Pulmonary/Chest: Effort normal. No respiratory distress.  Abdominal: Soft.  Neurological: She is unresponsive.  Flutters eyes to physical stimulation  Skin: Skin is warm and dry. She is not diaphoretic.    Vital Signs: BP 105/71   Pulse 84   Temp 97.6 F (36.4 C)   Resp 20   SpO2 100%  Pain Scale: 0-10       SpO2: SpO2: 100 % O2 Device:SpO2: 100 % O2 Flow Rate: .O2 Flow Rate (L/min): 2 L/min  IO: Intake/output summary:   Intake/Output Summary (Last 24 hours) at 11/11/2017 1459 Last data filed at 11/11/2017 1238 Gross per 24 hour  Intake 2602.5 ml  Output -  Net 2602.5 ml    LBM:   Baseline Weight:   Most recent weight:  Palliative Assessment/Data: PPS 10%   Flowsheet Rows     Most Recent Value  Intake Tab  Referral Department  Hospitalist  Unit at Time of Referral  ER  Palliative Care Primary Diagnosis  Sepsis/Infectious Disease  Date Notified  11/11/17  Palliative Care Type  New Palliative care  Reason for referral  Clarify Goals of Care  Date of Admission  11/11/17  Date first seen by Palliative Care  11/11/17  # of days Palliative referral response time  0 Day(s)  # of days IP prior to Palliative referral  0  Clinical Assessment  Palliative Performance Scale Score  10%  Psychosocial & Spiritual Assessment  Palliative Care Outcomes      Time In: 1400 Time Out: 1430 Time Total: 30 minutes Greater than 50%  of this time was spent counseling and coordinating care related to the above assessment and plan.  Juel Burrow, DNP, AGNP-C Palliative Medicine Team [phone]

## 2017-11-11 NOTE — ED Triage Notes (Signed)
Pt arrived by EMS from home for NV. Per daughters, who are the caregivers, reports pt woke up this morning at 4am with NV. Started having diarrhea yesterday; home health nurse sent stool for c-diff which came back negative. Pt had a recent admission for pneumonia. EMS gave 4mg  zofran

## 2017-11-11 NOTE — H&P (Signed)
History and Physical   Patient: Megan [name]     PCP: [name], MD                    DOB: 1921/09/20            DOA: 11/11/2017 NWG:[id number]             DOS: 11/11/2017, 7:32 AM  Patient coming from: Home  I have personally reviewed patient's medical records, in electronic medical records, including: [facility], and care everywhere.  -------------------------------------------------------------------------------------------------------------  Chief Complaint:  Chief Complaint  Patient presents with  . Emesis    HPI: Megan [name] is a 84 y.o. female with medical history significant of  Emesis.    This is a [name] who is presenting from home for worsening mental status, nausea vomiting, diarrhea, possible aspiration by reported her daughter.   Patient has extensive history of debility, history of stroke, left-sided weakness, with contractions, seizure disorder, hyperlipidemia, diabetes mellitus.  Was recently hospitalized for pneumonia, as per daughter completed IV antibiotic treatment, was sent home oral antibiotics where she developed diarrhea, in anticipation of colitis Flagyl was started.  But started developing some nausea vomiting worsening mental status. Hemodynamically stable, afebrile, normotensive.  Caregiver, daughter reports of no fever, chills just nausea vomiting, watery diarrhea 2 episodes since last night.  Bedbound, contracted on the left side at baseline.  Daughter usually ambulates with assist, able to tolerate p.o.  Since recent illness her p.o. intake has been progressively poor   ED Course:   Upon arrival in ED patient's temp was 97.6 tachycardic at 109 tachypneic with respiratory rate of 33 blood pressure was as low as 95/56 on arrival. Mild leukocytosis with WBC of 13.4, lactic acidosis with 2.45 Chest x-ray interestingly reporting resolved right lung field opacity, left basilar and right basilar opacity consistent with possible  aspiration pneumonia Abscess protocol has been initiated, patient has been fluid resuscitation has been initiated along with the broad-spectrum antibiotics of vancomycin and cefepime  Review of Systems: As per HPI otherwise 12 point review of systems negative.  ---------------------------------------------------------------------------------------------------------------------  Principal Problem:   Sepsis (HCC) Active Problems:   TIA (transient ischemic attack)   Right upper lobe pneumonia (HCC)   Diabetes mellitus type II, uncontrolled (HCC)   HTN (hypertension)   History of partial seizures   History of CVA w/residual right side deficits   Protein-calorie malnutrition, severe (HCC)   Anemia   Dementia with Lewy bodies    Assessment/Plan: Principal Problem:   Sepsis (HCC) -due to possible aspiration pneumonia -Patient will be admitted to telemetry floor -We will continue with sepsis protocol, IV fluids, repeated labs, lactic acid -Family is adamant about aggressive treatment but confirms DNR/DNI status -We will continue current antibiotics and will narrow down accordingly -Current initiated IV antibiotics of vancomycin and cefepime   Aspiration pneumonia -Patient will be kept n.p.o., speech will be consulted for evaluation -Continue IV fluid and antibiotic treatment as above   Diarrhea/dehydration -We will continue IV fluid resuscitation, in the face of recent antibiotic use we will recheck for C. difficile colitis and treat accordingly -We will avoid any PPI for now, added lactobacillus,  Active Problems: H/o  CVA/ TIA (transient ischemic attack)  -We will continue statins and Plavix once patient is more alert and can tolerate p.o. -Currently at baseline bedbound, contracted left upper extremity    Diabetes mellitus type II, uncontrolled (HCC) -Since she will be n.p.o., will check her blood sugar  every 6 hours with sliding scale coverage    HTN (hypertension) -We  will monitor blood pressure closely, will treat with as needed hydralazine and labetalol -Be cognizant of her sepsis, monitor blood pressure closely as she might become hypotensive again    History of partial seizures -On p.o. Depakote we will switch it to IV for now.    History of CVA w/residual right side deficits -Stable, contracted extremities, bedbound    Protein-calorie malnutrition, severe (HCC) -Once more awake alert, will consult nutrition for dietary supplements    Anemia -history of chronic anemia, due to chronic disease -We will monitor H&H closely, stable    Dementia with Lewy bodies -Once tolerating p.o., resuming home medication fluting Namenda -We will monitor for behavior disturbances, and treat accordingly   Ethics -discussed with the caregiver at bedside confirm DNR/DNI status Height of care will be consulted as patient continues to decline, family needs assist to determine goal of care. Spent greater than 25 minutes discussing acute and chronic medical issue, continue declining, poor prognosis 2 daughters at bedside.  DVT prophylaxis:  Heparin [place],  SCDs / compression stockings        Code Status:        DNR/DNI  Family Communication:  The above findings and plan of care has been discussed with patient and family in detail, they expressed understanding and agreement of above plan.    Disposition Plan:  > 3 days            Home            Consults called: Palliative care team  Admission status:  Patient will be admitted as an inpatient, anticipating greater than 2 midnight length of stay.    tele bed  ----------------------------------------------------------------------------------------------------------------------  Allergies  Allergen Reactions  . Lipitor [Atorvastatin] Other (See Comments)    Myalgias     Home MEDs:  Prior to Admission medications   Medication Sig Start Date End Date Taking? Authorizing Provider  acetaminophen (TYLENOL) 325  MG tablet Take 2 tablets (650 mg total) by mouth every 6 (six) hours as needed for mild pain (or Fever >/= 101). 11/04/17  Yes Alwyn RenMathews, Elizabeth G, MD  Amino Acids-Protein Hydrolys (FEEDING SUPPLEMENT, PRO-STAT SUGAR FREE 64,) LIQD Take 30 mLs by mouth 4 (four) times daily -  with meals and at bedtime. 10/28/17  Yes Narda BondsNettey, Ralph A, MD  bisacodyl (DULCOLAX) 10 MG suppository Place 1 suppository (10 mg total) rectally daily as needed for moderate constipation. 11/04/17  Yes Alwyn RenMathews, Elizabeth G, MD  clopidogrel (PLAVIX) 75 MG tablet Take 1 tablet (75 mg total) by mouth daily with breakfast. 08/15/13  Yes [name], John, MD  divalproex (DEPAKOTE ER) 500 MG 24 hr tablet Take 1 tablet (500 mg total) by mouth at bedtime. 07/14/16  Yes Micki RileySethi, Pramod S, MD  famotidine (PEPCID) 20 MG tablet Take 1 tablet (20 mg total) by mouth daily. 10/29/17  Yes Narda BondsNettey, Ralph A, MD  ferrous sulfate 325 (65 FE) MG tablet Take 325 mg by mouth daily with breakfast.   Yes [provider]  guaiFENesin (MUCINEX) 600 MG 12 hr tablet Take 600 mg by mouth 2 (two) times daily as needed for to loosen phlegm.   Yes [provider]  lactulose (CHRONULAC) 10 GM/15ML solution Take 45 mLs (30 g total) by mouth 2 (two) times daily. 11/04/17  Yes Alwyn RenMathews, Elizabeth G, MD  Melatonin 5 MG TABS Take 5 mg by mouth at bedtime as needed (sleep).   Yes  [provider]  memantine (NAMENDA XR) 14 MG CP24 24 hr capsule Take 14 mg by mouth at bedtime.    Yes [provider]  Multiple Vitamin (MULTIVITAMIN WITH MINERALS) TABS tablet Take 1 tablet by mouth daily. 11/04/17  Yes Alwyn Ren, MD  NYSTATIN powder Apply 1 application topically 2 (two) times daily as needed for rash. 05/04/17  Yes [provider]  polyethylene glycol (MIRALAX) packet Take 17 g by mouth daily. 10/28/17  Yes Narda Bonds, MD  protein supplement shake (PREMIER PROTEIN) LIQD Take 59.1 mLs (2 oz total) by mouth 4 (four) times daily -   with meals and at bedtime. 10/28/17  Yes Narda Bonds, MD  rosuvastatin (CRESTOR) 5 MG tablet Take 5 mg by mouth every Monday, Wednesday, and Friday. At bedtime   Yes [provider]  sodium chloride 1 g tablet Take 1 g by mouth daily.   Yes [provider]  TOUJEO SOLOSTAR 300 UNIT/ML SOPN Inject 14 Units as directed every evening. Patient taking differently: Inject 18 Units as directed every evening.  06/21/17  Yes Lonia Blood, MD  vitamin C (ASCORBIC ACID) 500 MG tablet Take 500 mg by mouth daily.   Yes [provider]  zinc oxide (BALMEX) 11.3 % CREA cream Apply 1 application topically daily as needed (for rash).    Yes [provider]  amoxicillin-clavulanate (AUGMENTIN) 500-125 MG tablet Take 1 tablet (500 mg total) by mouth 2 (two) times daily. Patient not taking: Reported on 11/11/2017 11/04/17   Alwyn Ren, MD    PRN MEDs:   Past Medical History:  Diagnosis Date  . Diabetes mellitus   . Hyperlipidemia   . Seizure (HCC)   . Stroke [facility]) 2012   possible stroke Jan 2016  . UTI (urinary tract infection) 10/2017    Past Surgical History:  Procedure Laterality Date  . CATARACT EXTRACTION       reports that she has never smoked. She has quit using smokeless tobacco. Her smokeless tobacco use included snuff. She reports that she does not drink alcohol or use drugs.   Family History  Problem Relation Age of Onset  . CAD Mother   . Hypertension Mother   . Diabetes Mother   . Stroke Mother     Physical Exam: Vitals:   11/11/17 0630 11/11/17 0645 11/11/17 0700 11/11/17 0715  BP: (!) 192/167     Pulse: (!) 105 (!) 108 (!) 107 (!) 109  Resp: (!) 30 (!) 30 (!) 36 (!) 33  Temp:      SpO2: 100% 100% 100% 100%    Constitutional: NAD, calm, comfortable Vitals:   11/11/17 0630 11/11/17 0645 11/11/17 0700 11/11/17 0715  BP: (!) 192/167     Pulse: (!) 105 (!) 108 (!) 107 (!) 109  Resp: (!) 30 (!) 30 (!) 36 (!) 33  Temp:       SpO2: 100% 100% 100% 100%   Eyes: Limited exam, eyes were open but with forced reactive ENMT: Mucous membranes are moist. Posterior pharynx clear of any exudate or lesions.Normal dentition.  Neck: normal, supple, no masses, no thyromegaly Respiratory: clear to auscultation bilaterally, diffuse mild rhonchi/wheezing, no crackles. Normal respiratory effort. No accessory muscle use.  Cardiovascular: Regular rate and rhythm, no murmurs / rubs / gallops. No extremity edema. 2+ pedal pulses. No carotid bruits.  Abdomen: no tenderness, no masses palpated. No hepatosplenomegaly. Bowel sounds positive.  Musculoskeletal: Contracted upper extremities, generalized muscle wasting noted Neurologic:  Exam, patient's eyes are closed shut she would not follow any commands, currently nonverbal, contracted upper extremity, in bed Psychiatric dementia,, currently nonverbal, awake Skin: no rashes, lesions, ulcers. No induration Decubitus/ulcers: Stage II/III decubitus ulcer   Labs on Admission: I have personally reviewed following labs and imaging studies  CBC: Recent Labs  Lab 11/11/17 0600  WBC 13.4*  NEUTROABS 9.3*  HGB 11.9*  HCT 38.2  MCV 91.8  PLT 468*   Basic Metabolic Panel: Recent Labs  Lab 11/11/17 0600  NA 141  K 5.1  CL 108  CO2 22  GLUCOSE 242*  BUN 35*  CREATININE 0.92  CALCIUM 9.8   GFR: Estimated Creatinine Clearance: 34.3 mL/min (by C-G formula based on SCr of 0.92 mg/dL). Liver Function Tests: Recent Labs  Lab 11/11/17 0600  AST 19  ALT 10*  ALKPHOS 113  BILITOT 0.1*  PROT 7.7  ALBUMIN 2.4*   CBG: Recent Labs  Lab 11/04/17 0815 11/04/17 1142  GLUCAP 122* 160*   Urine analysis:    Component Value Date/Time   COLORURINE YELLOW 11/11/2017 0630   APPEARANCEUR CLEAR 11/11/2017 0630   LABSPEC 1.017 11/11/2017 0630   PHURINE 5.0 11/11/2017 0630   GLUCOSEU >=500 (A) 11/11/2017 0630   HGBUR NEGATIVE 11/11/2017 0630   BILIRUBINUR NEGATIVE 11/11/2017 0630    KETONESUR NEGATIVE 11/11/2017 0630   PROTEINUR NEGATIVE 11/11/2017 0630   UROBILINOGEN 0.2 10/23/2014 0830   NITRITE NEGATIVE 11/11/2017 0630   LEUKOCYTESUR TRACE (A) 11/11/2017 0630    Radiological Exams on Admission: Dg Chest Port 1 View  Result Date: 11/11/2017 CLINICAL DATA:  Nausea and vomiting.  Possible aspiration. EXAM: PORTABLE CHEST 1 VIEW COMPARISON:  Radiograph 11/03/2017 FINDINGS: Progressive left basilar from prior exam, minimal right basilar opacity which is similar to mildly progressed. Resolved right upper lobe airspace opacity from prior. Overall low lung volumes. Unchanged aortic tortuosity. Heart size not well assessed given positioning. No pleural fluid or pneumothorax. Chronic change about the right shoulder. IMPRESSION: Progressive left basilar opacity. Right basilar opacity on prior is unchanged or possibly progressed. Given distribution, findings suggest aspiration. Resolved right upper lobe pneumonia. Electronically Signed   By: Rubye Oaks M.D.   On: 11/11/2017 06:02    EKG: Independently reviewed. **  Orders placed or performed during the hospital encounter of 10/31/17  . EKG 12-Lead  . EKG 12-Lead     Time spent: > then  45 Min.   Kendell Bane MD [facility] ,  Pager [phone]  If 7PM-7AM, please contact night-coverage [email or web address]  Password [facility] 11/11/2017, 7:32 AM

## 2017-11-12 DIAGNOSIS — D649 Anemia, unspecified: Secondary | ICD-10-CM

## 2017-11-12 DIAGNOSIS — G459 Transient cerebral ischemic attack, unspecified: Secondary | ICD-10-CM

## 2017-11-12 DIAGNOSIS — E43 Unspecified severe protein-calorie malnutrition: Secondary | ICD-10-CM

## 2017-11-12 DIAGNOSIS — J181 Lobar pneumonia, unspecified organism: Secondary | ICD-10-CM

## 2017-11-12 DIAGNOSIS — I10 Essential (primary) hypertension: Secondary | ICD-10-CM

## 2017-11-12 LAB — APTT: aPTT: 34 seconds (ref 24–36)

## 2017-11-12 LAB — CBC
HCT: 29.3 % — ABNORMAL LOW (ref 36.0–46.0)
Hemoglobin: 9.1 g/dL — ABNORMAL LOW (ref 12.0–15.0)
MCH: 28.6 pg (ref 26.0–34.0)
MCHC: 31.1 g/dL (ref 30.0–36.0)
MCV: 92.1 fL (ref 78.0–100.0)
PLATELETS: 367 10*3/uL (ref 150–400)
RBC: 3.18 MIL/uL — AB (ref 3.87–5.11)
RDW: 18.9 % — ABNORMAL HIGH (ref 11.5–15.5)
WBC: 10.1 10*3/uL (ref 4.0–10.5)

## 2017-11-12 LAB — BASIC METABOLIC PANEL
Anion gap: 12 (ref 5–15)
BUN: 10 mg/dL (ref 6–20)
CHLORIDE: 102 mmol/L (ref 101–111)
CO2: 23 mmol/L (ref 22–32)
CREATININE: 1.18 mg/dL — AB (ref 0.44–1.00)
Calcium: 9.3 mg/dL (ref 8.9–10.3)
GFR calc Af Amer: 45 mL/min — ABNORMAL LOW (ref >60)
GFR calc non Af Amer: 39 mL/min — ABNORMAL LOW (ref >60)
Glucose, Bld: 147 mg/dL — ABNORMAL HIGH (ref 65–99)
Potassium: 3.5 mmol/L (ref 3.5–5.1)
Sodium: 137 mmol/L (ref 135–145)

## 2017-11-12 LAB — PROTIME-INR
INR: 1.39
Prothrombin Time: 17 seconds — ABNORMAL HIGH (ref 11.4–15.2)

## 2017-11-12 LAB — GLUCOSE, CAPILLARY
GLUCOSE-CAPILLARY: 149 mg/dL — AB (ref 65–99)
GLUCOSE-CAPILLARY: 166 mg/dL — AB (ref 65–99)
Glucose-Capillary: 72 mg/dL (ref 65–99)
Glucose-Capillary: 149 mg/dL — ABNORMAL HIGH (ref 65–99)
Glucose-Capillary: 122 mg/dL — ABNORMAL HIGH (ref 65–99)

## 2017-11-12 LAB — LACTIC ACID, PLASMA: Lactic Acid, Venous: 1.1 mmol/L (ref 0.5–1.9)

## 2017-11-12 MED ORDER — DEXTROSE-NACL 5-0.45 % IV SOLN
INTRAVENOUS | Status: DC
  Administered 2017-11-12: 1000 mL via INTRAVENOUS
  Administered 2017-11-13 – 2017-11-14 (×2): via INTRAVENOUS

## 2017-11-12 MED ORDER — DEXTROSE 50 % IV SOLN
0.5000 | Freq: Once | INTRAVENOUS | Status: AC
  Administered 2017-11-12: 25 mL via INTRAVENOUS
  Filled 2017-11-12: qty 50

## 2017-11-12 MED ORDER — SODIUM CHLORIDE 0.9 % IV SOLN
1.5000 g | Freq: Two times a day (BID) | INTRAVENOUS | Status: DC
  Administered 2017-11-12 – 2017-11-13 (×2): 1.5 g via INTRAVENOUS
  Filled 2017-11-12 (×3): qty 1.5

## 2017-11-12 NOTE — Evaluation (Signed)
Physical Therapy Evaluation Patient Details Name: Megan [name] MRN: [id number] DOB: August 25, 1921 Today's Date: 11/12/2017   History of Present Illness  Pt is a 84 y/o female admitted secondary to sepsis likely fron aspiration PNA. PMH including but not limited to dementia, CVA with R sided weakness, DM and HTN. Of note, pt has been admitted to this hopsital five times in the past six months.    Clinical Impression  Pt presented supine in bed with HOB elevated, awake and willing to participate in therapy session. Pt's daughter present throughout session as well and providing all history information. Pt nonverbal throughout and very HOH. Prior to admission, pt was very dependent on family for transfers (hospital bed<>w/c<>BSC) and for ADLs. Pt currently requires total A x2 for bed mobility and transfers with close min guard to min A for sitting balance. Pt would benefit from SNF for further intensive therapy services prior to returning home with family; however, pt's daughter would like for pt to return home. Pt's family would benefit from a Hoyer Lift to allow them to perform transfers in a safer manner. PT will continue to follow pt acutely to progress mobility as tolerated.     Follow Up Recommendations SNF;Supervision/Assistance - 24 hour;Other (comment)(if family refuses, will need HHPT)    Equipment Recommendations  Other (comment)(HOYER LIFT)    Recommendations for Other Services       Precautions / Restrictions Precautions Precautions: Fall Restrictions Weight Bearing Restrictions: No      Mobility  Bed Mobility Overal bed mobility: Needs Assistance Bed Mobility: Supine to Sit     Supine to sit: Total assist;+2 for physical assistance     General bed mobility comments: total A x2 for all aspects with use of bed pads to position pt's hips at EOB  Transfers Overall transfer level: Needs assistance Equipment used: 2 person hand held assist Transfers: Sit to/from [employer] Sit to Stand: Total assist;+2 physical assistance Stand pivot transfers: Total assist;+2 physical assistance       General transfer comment: total A x2 with therapist and tech blocking knees bilaterally with use of bed pads and gait belt  Ambulation/Gait                Stairs            Wheelchair Mobility    Modified Rankin (Stroke Patients Only)       Balance Overall balance assessment: Needs assistance Sitting-balance support: Feet supported Sitting balance-Leahy Scale: Poor Sitting balance - Comments: forward lean, close min guard for safety, min A at times   Standing balance support: During functional activity Standing balance-Leahy Scale: Zero                               Pertinent Vitals/Pain Pain Assessment: No/denies pain    Home Living Family/patient expects to be discharged to:: Private residence Living Arrangements: Children Available Help at Discharge: Family;Available 24 hours/day Type of Home: House Home Access: Level entry     Home Layout: One level Home Equipment: Walker - 2 wheels;Bedside commode;Wheelchair - manual;Hospital bed Additional Comments: also has a lift chair    Prior Function Level of Independence: Needs assistance   Gait / Transfers Assistance Needed: 1-2 person assist for transfers, uses w/c needs assistance to propel  ADL's / Homemaking Assistance Needed: total A        Hand Dominance        Extremity/Trunk Assessment  Upper Extremity Assessment Upper Extremity Assessment: Generalized weakness    Lower Extremity Assessment Lower Extremity Assessment: Generalized weakness    Cervical / Trunk Assessment Cervical / Trunk Assessment: Kyphotic  Communication   Communication: Expressive difficulties;HOH  Cognition Arousal/Alertness: Awake/alert Behavior During Therapy: Flat affect Overall Cognitive Status: History of cognitive impairments - at baseline                                  General Comments: Pt unable to follow commands or respond to any questions.      General Comments      Exercises     Assessment/Plan    PT Assessment Patient needs continued PT services  PT Problem List Decreased strength;Decreased range of motion;Decreased activity tolerance;Decreased balance;Decreased mobility;Decreased coordination;Decreased cognition;Decreased knowledge of use of DME;Decreased safety awareness;Decreased knowledge of precautions;Cardiopulmonary status limiting activity       PT Treatment Interventions DME instruction;Functional mobility training;Therapeutic activities;Therapeutic exercise;Neuromuscular re-education;Balance training;Cognitive remediation;Patient/family education    PT Goals (Current goals can be found in the Care Plan section)  Acute Rehab PT Goals Patient Stated Goal: to go home per daughter  PT Goal Formulation: With family Time For Goal Achievement: 11/26/17 Potential to Achieve Goals: Fair    Frequency Min 3X/week   Barriers to discharge        Co-evaluation               AM-PAC PT "6 Clicks" Daily Activity  Outcome Measure Difficulty turning over in bed (including adjusting bedclothes, sheets and blankets)?: Unable Difficulty moving from lying on back to sitting on the side of the bed? : Unable Difficulty sitting down on and standing up from a chair with arms (e.g., wheelchair, bedside commode, etc,.)?: Unable Help needed moving to and from a bed to chair (including a wheelchair)?: Total Help needed walking in hospital room?: Total Help needed climbing 3-5 steps with a railing? : Total 6 Click Score: 6    End of Session Equipment Utilized During Treatment: Gait belt;Oxygen Activity Tolerance: Patient limited by fatigue Patient left: in chair;with call bell/phone within reach;with chair alarm set;with family/visitor present Nurse Communication: Mobility status;Need for lift equipment PT Visit  Diagnosis: Other abnormalities of gait and mobility (R26.89)    Time: 1105-1140 PT Time Calculation (min) (ACUTE ONLY): 35 min   Charges:   PT Evaluation $PT Eval Moderate Complexity: 1 Mod PT Treatments $Therapeutic Activity: 8-22 mins   PT G Codes:        [place] NorthwestJennifer Tanessa Tidd, PT, DPT [phone]   Alessandra BevelsJennifer M Marykathryn Carboni 11/12/2017, 2:29 PM

## 2017-11-12 NOTE — Evaluation (Signed)
Clinical/Bedside Swallow Evaluation Patient Details  Name: Megan [name] MRN: [id number] Date of Birth: 1922-06-21  Today's Date: 11/12/2017 Time: SLP Start Time (ACUTE ONLY): 4098 SLP Stop Time (ACUTE ONLY): 1002 SLP Time Calculation (min) (ACUTE ONLY): 24 min  Past Medical History:  Past Medical History:  Diagnosis Date  . Diabetes mellitus   . Hyperlipidemia   . Seizure (HCC)   . Stroke [facility]) 2012   possible stroke Jan 2016  . UTI (urinary tract infection) 10/2017   Past Surgical History:  Past Surgical History:  Procedure Laterality Date  . CATARACT EXTRACTION     HPI:  [name] who is presenting from home for worsening mental status, nausea vomiting, diarrhea, possible aspiration by reported her daughter; CXR on 11/11/17 indicated Progressive left basilar opacity. Right basilar opacity on prior is unchanged or possibly progressed. Given distribution, findings suggest aspiration; pt has been hospitalized multiple times during February/March 2019; BSE completed on 10/27/17 indicating need for puree/thin with swallowing precautions; family reports pt will pocket foods at home on right side, swallow is delayed and oral holding present during mealtime as well.  Assessment / Plan / Recommendation Clinical Impression   Pt with oropharyngeal dysphagia characterized by oral holding, decreased oral transit, delay in the initiation of the swallow with initial swallow 10+ seconds and subsequent swallows improved with speed with an average of 4-5 sec per swallow depending on consistency provided; pt with immediate cough with puree and multiple swallows; vocal quality extremely hoarse with periods of aphonia during conversation; recommend NPO until an instrumental examination is completed d/t pt's moderate risk for aspiration paired with cognitive impairment, hx of PNA and underlying dysphagia. MBS recommended to r/o aspiration and determine safest diet/swallowing precautions;  meds via IV until MBS is completed; pt may have ice chips if requested for comfort. SLP Visit Diagnosis: Dysphagia, oropharyngeal phase (R13.12)    Aspiration Risk  Moderate aspiration risk    Diet Recommendation   NPO pending instrumental examination on 10/16/17  Medication Administration: Via alternative means    Other  Recommendations Oral Care Recommendations: Oral care QID   Follow up Recommendations Other (comment)(TBD)      Frequency and Duration min 2x/week  1 week       Prognosis Prognosis for Safe Diet Advancement: Fair Barriers to Reach Goals: Cognitive deficits;Severity of deficits      Swallow Study   General Date of Onset: 11/11/17 HPI: [name] who is presenting from home for worsening mental status, nausea vomiting, diarrhea, possible aspiration by reported her daughter Type of Study: Bedside Swallow Evaluation Previous Swallow Assessment: 10/27/17 BSE indicated puree/thin with swallowing precautions Diet Prior to this Study: NPO Temperature Spikes Noted: No Respiratory Status: Nasal cannula History of Recent Intubation: No Behavior/Cognition: Alert;Confused Oral Cavity Assessment: Dry Oral Care Completed by SLP: Yes Oral Cavity - Dentition: Adequate natural dentition Self-Feeding Abilities: Able to feed self;Needs assist;Needs set up Patient Positioning: Upright in bed Baseline Vocal Quality: Aphonic;Hoarse Volitional Cough: Strong Volitional Swallow: Unable to elicit    Oral/Motor/Sensory Function Overall Oral Motor/Sensory Function: Generalized oral weakness Facial ROM: Reduced right;Other (Comment)(slight) Facial Symmetry: Abnormal symmetry right;Other (Comment)(slight) Lingual ROM: Reduced right Lingual Symmetry: Abnormal symmetry right;Other (Comment)(slight) Lingual Strength: Reduced   Ice Chips Ice chips: Impaired Presentation: Spoon Oral Phase Impairments: Poor awareness of bolus Oral Phase Functional Implications:  Prolonged oral transit;Oral holding Pharyngeal Phase Impairments: Suspected delayed Swallow   Thin Liquid Thin Liquid: Impaired Presentation: Cup;Spoon Oral Phase Functional Implications: Oral holding Pharyngeal  Phase Impairments: Suspected delayed Swallow;Multiple swallows    Nectar Thick Nectar Thick Liquid: Not tested   Honey Thick Honey Thick Liquid: Not tested   Puree Puree: Impaired Presentation: Spoon Oral Phase Impairments: Poor awareness of bolus Oral Phase Functional Implications: Prolonged oral transit;Oral holding Pharyngeal Phase Impairments: Suspected delayed Swallow;Multiple swallows;Cough - Immediate   Solid      Solid: Not tested        Tressie StalkerPat Alethia Melendrez, M.S., CCC-SLP 11/12/2017,10:24 AM

## 2017-11-12 NOTE — Progress Notes (Signed)
PROGRESS NOTE    Megan [name]  XBJ:[id number] DOB: May 04, 1922 DOA: 11/11/2017 PCP: [name], John, MD   Brief Narrative:  [name] who is presenting from home for worsening mental status, nausea vomiting, diarrhea, possible aspiration by reported her daughter. History of debility, history of stroke, left-sided weakness, with contractions, seizure disorder, hyperlipidemia, diabetes mellitus.  Admitted for sepsis secondary to aspiration pneumonia.   Assessment & Plan   Sepsis secondary to aspiration pneumonia -Presented with tachypnea, tachycardia, leukocytosis, elevated lactic acid level -Chest x-ray: resolved RUL pneumonia. Left and right basilar opacities  -Was placed on vancomycin and cefepime however given aspiration possibility, will transition to unasyn -Speech therapy consulted and recommended MBS -Continue IV fluid  Diarrhea/dehydration -Continue IV fluid -C. difficile PCR negative -Continue lactobacillus  History of CVA -Plavix and statin currently held due to aspiration risk -Patient with residual right-sided deficits  Diabetes mellitus, type II -Continue CBG monitoring -Given age and n.p.o. status, will not place on insulin at this time  Seizure disorder -Continue depacon  Chronic anemia -Hemoglobin currently stable, 9.1 (baseline 10) -Continue to monitor CBC  Dementia -Will continue Namenda once patient is able to pass swallow evaluation -Suspect will continue to worsen given patient's age and comorbidities with frequent hospitalizations.  Discussed this with patient's daughters at bedside  Goals of care -Confirmed CODE STATUS with patient's daughters at bedside.  Currently DNR -Patient has had 5 hospitalizations over the past 6 months -Palliative care consulted and appreciated  Malnutrition -nutrition will be consulted once patient able to pass swallow eval  DVT Prophylaxis  Heparin  Code Status: DNR  Family Communication:  Daughters at bedside  Disposition Plan: Admitted. Pending improvement in status as well as speech eval and palliative consult. Dispo pending.  Consultants Palliative care  Procedures  None  Antibiotics   Anti-infectives (From admission, onward)   Start     Dose/Rate Route Frequency Ordered Stop   11/12/17 1400  ampicillin-sulbactam (UNASYN) 1.5 g in sodium chloride 0.9 % 100 mL IVPB     1.5 g 200 mL/hr over 30 Minutes Intravenous Every 12 hours 11/12/17 1302     11/12/17 0800  vancomycin (VANCOCIN) IVPB 1000 mg/200 mL premix  Status:  Discontinued     1,000 mg 200 mL/hr over 60 Minutes Intravenous Every 24 hours 11/11/17 1055 11/12/17 1229   11/12/17 0700  ceFEPIme (MAXIPIME) 1 g in sodium chloride 0.9 % 100 mL IVPB  Status:  Discontinued     1 g 200 mL/hr over 30 Minutes Intravenous Every 24 hours 11/11/17 1052 11/12/17 1229   11/11/17 2200  ceFEPIme (MAXIPIME) 1 g in sodium chloride 0.9 % 100 mL IVPB  Status:  Discontinued     1 g 200 mL/hr over 30 Minutes Intravenous Every 12 hours 11/11/17 0801 11/11/17 1052   11/11/17 0645  ceFEPIme (MAXIPIME) 2 g in sodium chloride 0.9 % 100 mL IVPB     2 g 200 mL/hr over 30 Minutes Intravenous  Once 11/11/17 0639 11/11/17 0731   11/11/17 0645  vancomycin (VANCOCIN) IVPB 1000 mg/200 mL premix     1,000 mg 200 mL/hr over 60 Minutes Intravenous  Once 11/11/17 [id number] 11/11/17 0841      Subjective:   Sakari Drzewiecki seen and examined today.  Patient with dementia. Not interactive.    Objective:   Vitals:   11/12/17 0043 11/12/17 0046 11/12/17 0524 11/12/17 1320  BP: (!) 81/44 (!) 103/58 (!) 104/47 (!) 112/51  Pulse:   75 78  Resp:  16 20  Temp:   97.7 F (36.5 C) 98.1 F (36.7 C)  TempSrc:   Oral Oral  SpO2:   100% 100%  Weight:   68.6 kg (151 lb 3.8 oz)     Intake/Output Summary (Last 24 hours) at 11/12/2017 1420 Last data filed at 11/12/2017 1406 Gross per 24 hour  Intake 666.67 ml  Output 600 ml  Net 66.67 ml   Filed Weights     11/12/17 0524  Weight: 68.6 kg (151 lb 3.8 oz)    Exam  General: Well developed, chronically ill appearing, elderly, NAD  HEENT: NCAT, PERRLA, EOMI, Anicteic Sclera, mucous membranes moist.   Neck: Supple  Cardiovascular: S1 S2 auscultated, no rubs, murmurs or gallops. Regular rate and rhythm.  Respiratory: Diminished breath sounds, +nonproductive cough  Abdomen: Soft, nontender, nondistended, + bowel sounds  Extremities: warm dry without cyanosis clubbing or edema  Neuro: Awake and alert, cannot assess orientation. Dementia.   Data Reviewed: I have personally reviewed following labs and imaging studies  CBC: Recent Labs  Lab 11/11/17 0600 11/11/17 1633 11/12/17 0512  WBC 13.4* 13.3* 10.1  NEUTROABS 9.3*  --   --   HGB 11.9* 9.9* 9.1*  HCT 38.2 32.4* 29.3*  MCV 91.8 92.3 92.1  PLT 468* 413* 367   Basic Metabolic Panel: Recent Labs  Lab 11/11/17 0600 11/11/17 1633 11/12/17 0857  NA 141  --  137  K 5.1  --  3.5  CL 108  --  102  CO2 22  --  23  GLUCOSE 242*  --  147*  BUN 35*  --  10  CREATININE 0.92 0.85 1.18*  CALCIUM 9.8 8.6* 9.3  MG  --  1.9  --   PHOS  --  2.4*  --    GFR: Estimated Creatinine Clearance: 27 mL/min (A) (by C-G formula based on SCr of 1.18 mg/dL (H)). Liver Function Tests: Recent Labs  Lab 11/11/17 0600  AST 19  ALT 10*  ALKPHOS 113  BILITOT 0.1*  PROT 7.7  ALBUMIN 2.4*   No results for input(s): LIPASE, AMYLASE in the last 168 hours. No results for input(s): AMMONIA in the last 168 hours. Coagulation Profile: Recent Labs  Lab 11/11/17 0600 11/12/17 0512  INR 1.25 1.39   Cardiac Enzymes: No results for input(s): CKTOTAL, CKMB, CKMBINDEX, TROPONINI in the last 168 hours. BNP (last 3 results) No results for input(s): PROBNP in the last 8760 hours. HbA1C: No results for input(s): HGBA1C in the last 72 hours. CBG: Recent Labs  Lab 11/11/17 1818 11/12/17 0746 11/12/17 0852 11/12/17 1206  GLUCAP 173* 72 149* 122*    Lipid Profile: No results for input(s): CHOL, HDL, LDLCALC, TRIG, CHOLHDL, LDLDIRECT in the last 72 hours. Thyroid Function Tests: No results for input(s): TSH, T4TOTAL, FREET4, T3FREE, THYROIDAB in the last 72 hours. Anemia Panel: No results for input(s): VITAMINB12, FOLATE, FERRITIN, TIBC, IRON, RETICCTPCT in the last 72 hours. Urine analysis:    Component Value Date/Time   COLORURINE YELLOW 11/11/2017 2221   APPEARANCEUR HAZY (A) 11/11/2017 2221   LABSPEC 1.020 11/11/2017 2221   PHURINE 5.0 11/11/2017 2221   GLUCOSEU >=500 (A) 11/11/2017 2221   HGBUR NEGATIVE 11/11/2017 2221   BILIRUBINUR NEGATIVE 11/11/2017 2221   KETONESUR 5 (A) 11/11/2017 2221   PROTEINUR NEGATIVE 11/11/2017 2221   UROBILINOGEN 0.2 10/23/2014 0830   NITRITE NEGATIVE 11/11/2017 2221   LEUKOCYTESUR TRACE (A) 11/11/2017 2221   Sepsis Labs: @LABRCNTIP (procalcitonin:4,lacticidven:4)  ) Recent Results (from the past  240 hour(s))  Respiratory Panel by PCR     Status: None   Collection Time: 11/02/17  4:25 PM  Result Value Ref Range Status   Adenovirus NOT DETECTED NOT DETECTED Final   Coronavirus 229E NOT DETECTED NOT DETECTED Final   Coronavirus HKU1 NOT DETECTED NOT DETECTED Final   Coronavirus NL63 NOT DETECTED NOT DETECTED Final   Coronavirus OC43 NOT DETECTED NOT DETECTED Final   Metapneumovirus NOT DETECTED NOT DETECTED Final   Rhinovirus / Enterovirus NOT DETECTED NOT DETECTED Final   Influenza A NOT DETECTED NOT DETECTED Final   Influenza B NOT DETECTED NOT DETECTED Final   Parainfluenza Virus 1 NOT DETECTED NOT DETECTED Final   Parainfluenza Virus 2 NOT DETECTED NOT DETECTED Final   Parainfluenza Virus 3 NOT DETECTED NOT DETECTED Final   Parainfluenza Virus 4 NOT DETECTED NOT DETECTED Final   Respiratory Syncytial Virus NOT DETECTED NOT DETECTED Final   Bordetella pertussis NOT DETECTED NOT DETECTED Final   Chlamydophila pneumoniae NOT DETECTED NOT DETECTED Final   Mycoplasma pneumoniae NOT  DETECTED NOT DETECTED Final    Comment: Performed at [facility], [address] [place], [place] [address]  Culture, blood (routine x 2)     Status: None   Collection Time: 11/03/17 11:00 AM  Result Value Ref Range Status   Specimen Description BLOOD LEFT ANTECUBITAL  Final   Special Requests   Final    BOTTLES DRAWN AEROBIC AND ANAEROBIC Blood Culture results may not be optimal due to an excessive volume of blood received in culture bottles   Culture   Final    NO GROWTH 5 DAYS Performed at [facility], [address], [place], [place] [address]    Report Status 11/08/2017 FINAL  Final  Culture, blood (routine x 2)     Status: None   Collection Time: 11/03/17 11:10 AM  Result Value Ref Range Status   Specimen Description BLOOD LEFT HAND  Final   Special Requests   Final    BOTTLES DRAWN AEROBIC AND ANAEROBIC Blood Culture adequate volume   Culture   Final    NO GROWTH 5 DAYS Performed at [facility], [address], [place], [place] [address]    Report Status 11/08/2017 FINAL  Final  Blood Culture (routine x 2)     Status: None (Preliminary result)   Collection Time: 11/11/17  8:03 AM  Result Value Ref Range Status   Specimen Description BLOOD LEFT ANTECUBITAL  Final   Special Requests IN PEDIATRIC BOTTLE Blood Culture adequate volume  Final   Culture   Final    NO GROWTH 1 DAY Performed at [facility], [address], [place], [place] [address]    Report Status PENDING  Incomplete  Blood Culture (routine x 2)     Status: None (Preliminary result)   Collection Time: 11/11/17 11:53 AM  Result Value Ref Range Status   Specimen Description BLOOD BLOOD LEFT HAND  Final   Special Requests IN PEDIATRIC BOTTLE Blood Culture adequate volume  Final   Culture   Final    NO GROWTH 1 DAY Performed at [facility], [address] [place], [place] [address]    Report Status PENDING  Incomplete  C difficile quick scan w PCR reflex     Status: None    Collection Time: 11/11/17  3:55 PM  Result Value Ref Range Status   C Diff antigen NEGATIVE NEGATIVE Final   C  Diff toxin NEGATIVE NEGATIVE Final   C Diff interpretation No C. difficile detected.  Final    Comment: Performed at [facility], [address] [place], [place] [address]      Radiology Studies: Dg Chest Port 1 View  Result Date: 11/11/2017 CLINICAL DATA:  Nausea and vomiting.  Possible aspiration. EXAM: PORTABLE CHEST 1 VIEW COMPARISON:  Radiograph 11/03/2017 FINDINGS: Progressive left basilar from prior exam, minimal right basilar opacity which is similar to mildly progressed. Resolved right upper lobe airspace opacity from prior. Overall low lung volumes. Unchanged aortic tortuosity. Heart size not well assessed given positioning. No pleural fluid or pneumothorax. Chronic change about the right shoulder. IMPRESSION: Progressive left basilar opacity. Right basilar opacity on prior is unchanged or possibly progressed. Given distribution, findings suggest aspiration. Resolved right upper lobe pneumonia. Electronically Signed   By: Rubye Oaks M.D.   On: 11/11/2017 06:02     Scheduled Meds: . aspirin EC  81 mg Oral Daily  . clopidogrel  75 mg Oral Q breakfast  . docusate sodium  100 mg Oral BID  . feeding supplement (PRO-STAT SUGAR FREE 64)  30 mL Oral TID WC & HS  . ferrous sulfate  325 mg Oral Q breakfast  . heparin  5,000 Units Subcutaneous Q8H  . lactobacillus acidophilus  2 tablet Oral TID  . memantine  14 mg Oral QHS  . multivitamin with minerals  1 tablet Oral Daily  . nystatin   Topical BID  . protein supplement shake  2 oz Oral TID WC & HS  . rosuvastatin  5 mg Oral Q M,W,F  . sodium chloride  1 g Oral Daily  . vitamin C  500 mg Oral Daily  . zinc oxide  1 application Topical Daily   Continuous Infusions: . ampicillin-sulbactam (UNASYN) IV Stopped (11/12/17 1436)  . dextrose 5 % and 0.45% NaCl 1,000 mL (11/12/17 1017)  . lactated ringers    .  valproate sodium Stopped (11/12/17 1042)     LOS: 1 day   Time Spent in minutes   30 minutes  Zephyra Bernardi D.O. on 11/12/2017 at 2:20 PM  Between 7am to 7pm - Pager - [phone]  After 7pm go to [email or web address] - password [email or web address]  And look for the night coverage person covering for me after hours  [facility] Office  [phone]

## 2017-11-12 NOTE — Progress Notes (Signed)
Pharmacy Antibiotic Note  Megan [name] is a 84 y.o. female admitted on 11/11/2017 with sepsis possibly from aspiration pna.  Pharmacy has been consulted for Unasyn dosing.   WBC trending down, afebrile. SCr 1.1- appears above baseline of ~0.6-0.8.  Plan: Unasyn 1.5g IV q12h Follow c/s, clinical progression, de-escalation/LOT, renal function  Weight: 151 lb 3.8 oz (68.6 kg)  Temp (24hrs), Avg:97.7 F (36.5 C), Min:97.4 F (36.3 C), Max:97.9 F (36.6 C)  Recent Labs  Lab 11/11/17 0600 11/11/17 0609 11/11/17 0810 11/11/17 1153 11/11/17 1633 11/12/17 0512 11/12/17 0857  WBC 13.4*  --   --   --  13.3* 10.1  --   CREATININE 0.92  --   --   --  0.85  --  1.18*  LATICACIDVEN  --  2.45* 1.6 2.9*  --   --  1.1    Estimated Creatinine Clearance: 27 mL/min (A) (by C-G formula based on SCr of 1.18 mg/dL (H)).    Allergies  Allergen Reactions  . Lipitor [Atorvastatin] Other (See Comments)    Myalgias     Antimicrobials this admission: Cefepime 3/22 >> 3/23 Vancomycin 3/22 >> 3/23 Unasyn 3/23>>  Dose adjustments this admission: None   Microbiology results: 3/22 Bcx: 3/22 Cdiff: antigen and toxin neg 3/22 urine:    Thank you for allowing pharmacy to be a part of this patient's care.  Kedron Uno D. Haroon Shatto, PharmD, BCPS Clinical Pharmacist Clinical Phone for 11/12/2017 until 3:30pm: [phone] If after 3:30pm, please call main pharmacy at [phone] 11/12/2017 1:01 PM

## 2017-11-13 ENCOUNTER — Inpatient Hospital Stay (HOSPITAL_COMMUNITY): Payer: Medicare Other

## 2017-11-13 LAB — BASIC METABOLIC PANEL
ANION GAP: 5 (ref 5–15)
BUN: 11 mg/dL (ref 6–20)
CHLORIDE: 113 mmol/L — AB (ref 101–111)
CO2: 23 mmol/L (ref 22–32)
Calcium: 8.5 mg/dL — ABNORMAL LOW (ref 8.9–10.3)
Creatinine, Ser: 0.63 mg/dL (ref 0.44–1.00)
GFR calc Af Amer: >60 mL/min (ref >60)
GFR calc non Af Amer: >60 mL/min (ref >60)
GLUCOSE: 148 mg/dL — AB (ref 65–99)
POTASSIUM: 3.8 mmol/L (ref 3.5–5.1)
Sodium: 141 mmol/L (ref 135–145)

## 2017-11-13 LAB — GLUCOSE, CAPILLARY
GLUCOSE-CAPILLARY: 210 mg/dL — AB (ref 65–99)
Glucose-Capillary: 140 mg/dL — ABNORMAL HIGH (ref 65–99)
Glucose-Capillary: 148 mg/dL — ABNORMAL HIGH (ref 65–99)
Glucose-Capillary: 142 mg/dL — ABNORMAL HIGH (ref 65–99)
Glucose-Capillary: 219 mg/dL — ABNORMAL HIGH (ref 65–99)

## 2017-11-13 LAB — CBC
HEMATOCRIT: 28.2 % — AB (ref 36.0–46.0)
HEMOGLOBIN: 8.8 g/dL — AB (ref 12.0–15.0)
MCH: 28.8 pg (ref 26.0–34.0)
MCHC: 31.2 g/dL (ref 30.0–36.0)
MCV: 92.2 fL (ref 78.0–100.0)
Platelets: 355 10*3/uL (ref 150–400)
RBC: 3.06 MIL/uL — AB (ref 3.87–5.11)
RDW: 19 % — ABNORMAL HIGH (ref 11.5–15.5)
WBC: 7.7 10*3/uL (ref 4.0–10.5)

## 2017-11-13 MED ORDER — SODIUM CHLORIDE 0.9% FLUSH: 10.0000 mL | INTRAVENOUS | Status: DC | PRN

## 2017-11-13 MED ORDER — SODIUM CHLORIDE 0.9 % IV SOLN
1.5000 g | Freq: Three times a day (TID) | INTRAVENOUS | Status: DC
  Administered 2017-11-13 – 2017-11-17 (×12): 1.5 g via INTRAVENOUS
  Filled 2017-11-13 (×13): qty 1.5

## 2017-11-13 NOTE — Progress Notes (Signed)
Pharmacy Antibiotic Note  Omar PersonRuby Hoefle is a 84 y.o. female admitted on 11/11/2017 with sepsis possibly from aspiration pna.  Pharmacy has been consulted for Unasyn dosing.   WBC trending down, afebrile. SCr now back to baseline of 0.6 and CrCl >7130mL/min.  Plan: Increase Unasyn to 1.5g IV q8h Follow c/s, clinical progression, de-escalation/LOT, renal function  Weight: 151 lb 3.8 oz (68.6 kg)  Temp (24hrs), Avg:98.1 F (36.7 C), Min:98.1 F (36.7 C), Max:98.2 F (36.8 C)  Recent Labs  Lab 11/11/17 0600 11/11/17 0609 11/11/17 0810 11/11/17 1153 11/11/17 1633 11/12/17 0512 11/12/17 0857 11/13/17 0641  WBC 13.4*  --   --   --  13.3* 10.1  --  7.7  CREATININE 0.92  --   --   --  0.85  --  1.18* 0.63  LATICACIDVEN  --  2.45* 1.6 2.9*  --   --  1.1  --     Estimated Creatinine Clearance: 39.9 mL/min (by C-G formula based on SCr of 0.63 mg/dL).    Allergies  Allergen Reactions  . Lipitor [Atorvastatin] Other (See Comments)    Myalgias     Antimicrobials this admission: Cefepime 3/22 >> 3/23 Vancomycin 3/22 >> 3/23 Unasyn 3/23>>  Dose adjustments this admission: 3/24- Unasyn changed from q12h to q8h with renal function improvement  Microbiology results: 3/22 Bcx: ngtd 3/22 Cdiff: antigen and toxin neg 3/22 urine: 30K enterococcus faecalis- if patient does not have symptoms, would not recommend treating   Thank you for allowing pharmacy to be a part of this patient's care.  Athalie Newhard D. Helaina Stefano, PharmD, BCPS Clinical Pharmacist Clinical Phone for 11/13/2017 until 3:30pm: [phone] If after 3:30pm, please call main pharmacy at [phone] 11/13/2017 1:06 PM

## 2017-11-13 NOTE — Progress Notes (Addendum)
Daily Progress Note   Patient Name: Megan [name]       Date: 11/13/2017 DOB: [birth year]  Age: 84 y.o. MRN#: [id number] Attending Physician: Cristal Ford, DO Primary Care Physician: Lavone Orn, MD Admit Date: 11/11/2017  Reason for Consultation/Follow-up: Establishing goals of care and Psychosocial/spiritual support  Subjective: Met with patient and her primary care taker, daughter, and HPCOA, Fraser Din [name]).  We talked for along time about her mother's medical history, life at home, the decline over the last 6 months.   Fraser Din does an amazing job caring for her mother at home.  She is very attuned to her needs and works hard to give her mother stimulation and enjoyment.   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  She has had a career in medicine and understands how antibiotics and other types of medical care can be helpful or cause problems.  Fraser Din used to take care of both her grandmother and her mother together.  Her grand mother died at age 62 after a massive stroke.  We discussed how her mother is different and her decline appears to be happening much more gradually - Fraser Din is still able to recognize the distinct decline.  We specifically discussed aspiration, and that her mother would keep aspirating no matter what.  She will aspirate on her own saliva.  Fraser Din understands and accepts the risk of aspiration.  She would like for her mother to be on the safest diet possible.  We discussed PEG tubes and I recommended against one.  Fraser Din understands a PEG will likely cause her mother agitation and it will not stop her from aspirating.  We discussed Hospice Services - Fraser Din is very interested in Hospice services in the home.  She never wants her mother in a nursing facility again.  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  She had 5 children and put all of them thru [place].  She is a quiet, spiritual soul, who appears to prioritize her family / children above all else.     Assessment:  84 yo female with recurrent aspiration pneumonia.  Troubled with diarrhea (likely from antibiotics).  Well cared for at home by her daughter.    Patient Profile/HPI:  84 y.o. female  with  past medical history of CVA w/ R side weakness, seizures, debility, HLD, T2DM, HTN, and dementia admitted on 11/11/2017 with N/V/D and AMS. CXR revealed aspiration pna. Patient recently admitted 3/12-3/15 with pneumonia. She completed IV antibiotics and was subsequently placed on flagyl for colitis. C. Diff pending. Patient lives at home with daughter, [name]. Of note, this is 5th admission in 6 months.  Length of Stay: 2  Current Medications: Scheduled Meds:  . aspirin EC  81 mg Oral Daily  . clopidogrel  75 mg Oral Q breakfast  . docusate sodium  100 mg Oral BID  . feeding supplement (PRO-STAT SUGAR FREE 64)  30 mL Oral TID WC & HS  . ferrous sulfate  325 mg Oral Q breakfast  . heparin  5,000 Units Subcutaneous Q8H  . lactobacillus acidophilus  2 tablet Oral TID  . memantine  14 mg Oral QHS  . multivitamin with minerals  1 tablet Oral Daily  . nystatin   Topical BID  . protein supplement shake  2 oz Oral TID WC & HS  . rosuvastatin  5 mg Oral Q M,W,F  . sodium chloride  1 g Oral Daily  . vitamin C  500 mg Oral Daily  . zinc oxide  1 application Topical Daily    Continuous Infusions: . ampicillin-sulbactam (UNASYN) IV Stopped (11/13/17 0230)  . dextrose 5 % and 0.45% NaCl 75 mL/hr at 11/13/17 0430  . lactated ringers    . valproate sodium Stopped (11/13/17 1037)    PRN Meds: acetaminophen **OR** acetaminophen, albuterol, HYDROcodone-acetaminophen, ketorolac, levalbuterol, loperamide, magnesium citrate, Melatonin, ondansetron **OR** ondansetron (ZOFRAN) IV,  ondansetron (ZOFRAN) IV, senna-docusate, sodium chloride flush, sorbitol, traZODone  Physical Exam       Elderly demented female who does not speak to me, but did whisper 1 word to her daughter.  Awake, comfortable CV distant, regular resp no distress on 2L, no w/c/r.  Patient does not follow commands to "breathe deeply" Abdomen soft, nt, nd, +bs Extremites lower ext with 2+ swelling in prevlon boots.  Vital Signs: BP 115/61 (BP Location: Right Leg)   Pulse 80   Temp 98.1 F (36.7 C) (Axillary)   Resp 18   Wt 68.6 kg (151 lb 3.8 oz)   SpO2 93%   BMI 27.66 kg/m  SpO2: SpO2: 93 % O2 Device: O2 Device: Nasal Cannula O2 Flow Rate: O2 Flow Rate (L/min): 2 L/min  Intake/output summary:   Intake/Output Summary (Last 24 hours) at 11/13/2017 1033 Last data filed at 11/13/2017 0201 Gross per 24 hour  Intake 1168.75 ml  Output 400 ml  Net 768.75 ml   LBM: Last BM Date: 11/13/17 Baseline Weight: Weight: 68.6 kg (151 lb 3.8 oz) Most recent weight: Weight: 68.6 kg (151 lb 3.8 oz)       Palliative Assessment/Data: 20%    Patient Active Problem List   Diagnosis Date Noted  . Sepsis ([place]) 11/11/2017  . Aspiration pneumonia due to gastric secretions ([place])   . Goals of care, counseling/discussion   . Palliative care by specialist   . Increased ammonia level 11/01/2017  . Acute metabolic encephalopathy 63/33/5456  . HCAP (healthcare-associated pneumonia) 11/01/2017  . Pressure injury of skin 10/26/2017  . UTI (urinary tract infection) 10/26/2017  . Heel ulcer ([place]) 06/18/2017  . Unstageable pressure ulcer of sacral region ([place]) 06/18/2017  . Decubitus ulcer of sacral region, stage 2   . Idiopathic hypotension   . Seizure ([place]) 05/21/2017  . Hyponatremia 05/12/2017  . Acute renal failure  superimposed on stage 3 chronic kidney disease ([place]) 10/23/2014  . Dehydration 10/23/2014  . Acute hyperkalemia 10/23/2014  . Abnormal urinalysis 10/23/2014  . Chest pain 04/24/2014  . Altered  mental state 08/08/2013  . Dementia with Lewy bodies 08/07/2013  . Vascular dementia 08/07/2013  . Sleepiness 08/07/2013  . Anemia 07/21/2013  . Protein-calorie malnutrition, severe ([place]) 07/15/2013  . Herpes zoster 07/13/2013  . Spastic hemiplegia affecting dominant side ([place]) 06/25/2013  . History of CVA w/residual right side deficits 09/03/2012  . History of partial seizures   . TIA (transient ischemic attack) 01/06/2012  . Right upper lobe pneumonia ([place]) 01/06/2012  . Diabetes mellitus type II, uncontrolled ([place]) 01/06/2012  . HTN (hypertension) 01/06/2012  . H/O 01/06/2012    Palliative Care Plan    Recommendations/Plan:  Feed patient safest diet possible with aspiration precautions  Complete treatment for aspiration pneumonia  D/C with Hospice Services at home - including wound care.  Family requests [place] hospice.  Goals of Care and Additional Recommendations:  Limitations on Scope of Treatment: Avoid Hospitalization  Code Status:  DNR  Prognosis:   < 6 months   Discharge Planning:  Home with Hospice  Care plan was discussed with Daughter, bedside RN, Dr. Ree Kida  Thank you for allowing the Palliative Medicine Team to assist in the care of this patient.  Total time spent:   90 min. Time in 9:00 am Time Out 10:30 am.     Greater than 50%  of this time was spent counseling and coordinating care related to the above assessment and plan.  Florentina Jenny, PA-C Palliative Medicine  Please contact Palliative MedicineTeam phone at [phone] for questions and concerns between 7 am - 7 pm.   Please see AMION for individual provider pager numbers.

## 2017-11-13 NOTE — Progress Notes (Addendum)
Modified Barium Swallow Progress Note  Patient Details  Name: Megan [name] MRN: [id number] Date of Birth: 1921-08-26  Today's Date: 11/13/2017  Modified Barium Swallow completed.  Full report located under Chart Review in the Imaging Section.  Brief recommendations include the following:  Clinical Impression  Pt currently presenting with a severe oral phase, mild pharyngeal phase, cognitive-based dysphagia. Pt with oral holding of puree bolus greater than 30 seconds, along with minimal oral residuals post-swallow. With both nectar-thick and thin liquid consistencies, pt had inconsistent timing of swallow initiation, sometimes premature spill with bolus filling the vallecula and spilling over to pyriform sinuses prior to swallow. Despite this, pt had excellent airway protection and showed only one instance of flash penetration with nectar thick liquids but no other penetration or aspiration observed, no significant pharyngeal residuals, occasional oral residuals post-swallow. Pt was easily distracted during study, frequently looking down at IV and turning to look at video. While pt did not show any penetration or aspiration during this study, pt does continue to be at an increased risk due to cognitive status and oral phase deficits observed- decreased attention to bolus or oral residuals post-swallow. Daughter present for study- feels that pt coughs when drinking water but less coughing with thicker liquids such as protein shake. Recommend initiating dysphagia 1 diet, thin liquids, meds crushed in puree, or whole in puree if unable to crush, full supervision- ensure pt fully upright (in chair if possible), allow to self-feed to the extent she is able, monitor oral holding, ensure pt has swallowed bolus before providing additional food, minimize distractions, oral care before/ after meals. Pt has greater ease consuming liquids; would be beneficial to offer Ensure, soups (not chunky), ice cream, etc for  increased caloric intake. If frequent s/s of aspiration at bedside with thin liquids, may consider downgrade to nectar-thick, although during MBS there was essentially no benefit, as the timing of swallow initiation was variable with both. SLP will continue to follow for diet tolerance/ education.    Swallow Evaluation Recommendations       SLP Diet Recommendations: Dysphagia 1 (Puree) solids;Thin liquid   Liquid Administration via: Straw   Medication Administration: Crushed with puree   Supervision: Staff to assist with self feeding;Full supervision/cueing for compensatory strategies   Compensations: Minimize environmental distractions;Slow rate;Small sips/bites;Lingual sweep for clearance of pocketing   Postural Changes: Remain semi-upright after after feeds/meals (Comment);Seated upright at 90 degrees   Oral Care Recommendations: Oral care before and after PO   Other Recommendations: Clarify dietary restrictions    Metro Kungmy K Oleksiak, MA, CCC-SLP 11/13/2017,12:49 PM  [phone]

## 2017-11-13 NOTE — Care Management Note (Signed)
Case Management Note Donn PieriniKristi Ansar Skoda RN, BSN Unit 4E-Case Manager-- weekend coverage [phone]  Patient Details  Name: Omar PersonRuby Hach MRN: [id number] Date of Birth: [birth year]  Subjective/Objective:  Pt admitted with sepsis                  Action/Plan: PTA pt lived at home with daughter, Dennie Bibleat,  Is active with [facility] for RN/PT/OT services- PC consulted for GOC- referral received for Home Hospice- per daughter they would like [facility]- [facility] Forrest[facility], she will f/u with pt and daughter. CM will follow for transition of care needs. Pt will transport home via EMS.   Expected Discharge Date:                  Expected Discharge Plan:  Home w Hospice Care  In-House Referral:  NA  Discharge planning Services  CM Consult  Post Acute Care Choice:  Hospice Choice offered to:  Adult Children  DME Arranged:    DME Agency:     HH Arranged:  Disease Management HH Agency:  [facility]  Status of Service:  In process, will continue to follow  If discussed at Long Length of Stay Meetings, dates discussed:    Discharge Disposition:   Additional Comments:  Darrold SpanWebster, Lan Mcneill Hall, RN 11/13/2017, 1:46 PM

## 2017-11-13 NOTE — Progress Notes (Signed)
[facility])  Received request from [facility] for family interest in [facility] services at home after discharge. Chart reviewed and met with family at bedside. Patient information under review. Hospice eligibility pending at this time.   Met with patient's daughter, son and g-dtr at bedside to initiate education related to hospice philosophy, services and team approach to care. Family verbalized understanding of information provided. Per family and RNCM discharge date has not been determined at this time. Per family, patient will return home via [place]. Daughter has brochure with [facility] contact information and is aware [facility] liaison will follow up Monday morning.   DME needs discussed. Will follow up again tomorrow to confirm. Patient already has hospital bed, RW, 3N1 and WC. Discharge address has been confirmed as correct in Epic.   [facility] referral center aware of above and will contact family to schedule admission visit after patient arrives home.   Please send signed and completed out of facility DNR home with patient.  Please send scripts for any medication patient does not already have including comfort medications.   Will follow up with [facility] Monday morning.   Please do not hesitate to call with hospice related questions.   Thank you, Erling Conte, LCSW [phone]

## 2017-11-13 NOTE — Progress Notes (Signed)
PROGRESS NOTE    Megan [name]  ZOX:[id number] DOB: [birth year] DOA: 11/11/2017 PCP: [name], MD   Brief Narrative:  [name] who is presenting from home for worsening mental status, nausea vomiting, diarrhea, possible aspiration by reported her daughter. History of debility, history of stroke, left-sided weakness, with contractions, seizure disorder, hyperlipidemia, diabetes mellitus.  Admitted for sepsis secondary to aspiration pneumonia.   Assessment & Plan   Sepsis secondary to aspiration pneumonia -Presented with tachypnea, tachycardia, leukocytosis, elevated lactic acid level- all have resolved -Chest x-ray: resolved RUL pneumonia. Left and right basilar opacities  -Was placed on vancomycin and cefepime however given aspiration possibility, transitioned to unasyn -Speech therapy consulted and recommended MBS -Continue IV fluid- on D51/2NS  Diarrhea/dehydration -Continue IV fluid -C. difficile PCR negative -Continue lactobacillus  History of CVA -Plavix and statin currently held due to aspiration risk -Patient with residual right-sided deficits  Diabetes mellitus, type II -Continue CBG monitoring -Given age and n.p.o. status, will not place on insulin at this time  Seizure disorder -Continue depacon  Chronic anemia -Hemoglobin currently 8.8 (baseline 10) -mild drop in hemoglobin likely due to dilution from IVF -Continue to monitor CBC  Dementia -Will continue Namenda once patient is able to pass swallow evaluation -Suspect will continue to worsen given patient's age and comorbidities with frequent hospitalizations.  Discussed this with patient's daughter at bedside  Goals of care -Confirmed CODE STATUS with patient's daughters at bedside.  Currently DNR -Patient has had 5 hospitalizations over the past 6 months -Palliative care consulted and appreciated and recommended home with hospice and allowing patient to be fed, daughter  understands the risk of aspriation  Malnutrition -nutrition will be consulted once patient able to pass swallow eval  DVT Prophylaxis  Heparin  Code Status: DNR  Family Communication: Daughter at bedside  Disposition Plan: Admitted. Pending improvement in status as well as speech eval. Dispo home with hospice  Consultants Palliative care  Procedures  None  Antibiotics   Anti-infectives (From admission, onward)   Start     Dose/Rate Route Frequency Ordered Stop   11/12/17 1400  ampicillin-sulbactam (UNASYN) 1.5 g in sodium chloride 0.9 % 100 mL IVPB     1.5 g 200 mL/hr over 30 Minutes Intravenous Every 12 hours 11/12/17 1302     11/12/17 0800  vancomycin (VANCOCIN) IVPB 1000 mg/200 mL premix  Status:  Discontinued     1,000 mg 200 mL/hr over 60 Minutes Intravenous Every 24 hours 11/11/17 1055 11/12/17 1229   11/12/17 0700  ceFEPIme (MAXIPIME) 1 g in sodium chloride 0.9 % 100 mL IVPB  Status:  Discontinued     1 g 200 mL/hr over 30 Minutes Intravenous Every 24 hours 11/11/17 1052 11/12/17 1229   11/11/17 2200  ceFEPIme (MAXIPIME) 1 g in sodium chloride 0.9 % 100 mL IVPB  Status:  Discontinued     1 g 200 mL/hr over 30 Minutes Intravenous Every 12 hours 11/11/17 0801 11/11/17 1052   11/11/17 0645  ceFEPIme (MAXIPIME) 2 g in sodium chloride 0.9 % 100 mL IVPB     2 g 200 mL/hr over 30 Minutes Intravenous  Once 11/11/17 0639 11/11/17 0731   11/11/17 0645  vancomycin (VANCOCIN) IVPB 1000 mg/200 mL premix     1,000 mg 200 mL/hr over 60 Minutes Intravenous  Once 11/11/17 8119 11/11/17 0841      Subjective:   Lanyah Manthey seen and examined today.  Patient with dementia. Not interactive this morning. Patient's daughter states she was  more interactive yesterday afternoon and evening.  Objective:   Vitals:   11/12/17 1320 11/12/17 2100 11/13/17 0300 11/13/17 0500  BP: (!) 112/51 (!) 106/33 115/61 115/61  Pulse: 78 75  80  Resp: 20 18 18 18   Temp: 98.1 F (36.7 C) 98.2 F (36.8  C)  98.1 F (36.7 C)  TempSrc: Oral Axillary  Axillary  SpO2: 100% 100%  93%  Weight:        Intake/Output Summary (Last 24 hours) at 11/13/2017 1113 Last data filed at 11/13/2017 0201 Gross per 24 hour  Intake 1168.75 ml  Output 400 ml  Net 768.75 ml   Filed Weights   11/12/17 0524  Weight: 68.6 kg (151 lb 3.8 oz)   Exam  General: Well developed, chronically ill appearing, elderly, NAD  HEENT: NCAT, mucous membranes moist.   Neck: Supple  Cardiovascular: S1 S2 auscultated, RRR  Respiratory:Diminished breath sounds  Abdomen: Soft, nontender, nondistended, + bowel sounds  Extremities: warm dry without cyanosis clubbing or edema  Neuro: cannot assess due to dementia  Data Reviewed: I have personally reviewed following labs and imaging studies  CBC: Recent Labs  Lab 11/11/17 0600 11/11/17 1633 11/12/17 0512 11/13/17 0641  WBC 13.4* 13.3* 10.1 7.7  NEUTROABS 9.3*  --   --   --   HGB 11.9* 9.9* 9.1* 8.8*  HCT 38.2 32.4* 29.3* 28.2*  MCV 91.8 92.3 92.1 92.2  PLT 468* 413* 367 355   Basic Metabolic Panel: Recent Labs  Lab 11/11/17 0600 11/11/17 1633 11/12/17 0857 11/13/17 0641  NA 141  --  137 141  K 5.1  --  3.5 3.8  CL 108  --  102 113*  CO2 22  --  23 23  GLUCOSE 242*  --  147* 148*  BUN 35*  --  10 11  CREATININE 0.92 0.85 1.18* 0.63  CALCIUM 9.8 8.6* 9.3 8.5*  MG  --  1.9  --   --   PHOS  --  2.4*  --   --    GFR: Estimated Creatinine Clearance: 39.9 mL/min (by C-G formula based on SCr of 0.63 mg/dL). Liver Function Tests: Recent Labs  Lab 11/11/17 0600  AST 19  ALT 10*  ALKPHOS 113  BILITOT 0.1*  PROT 7.7  ALBUMIN 2.4*   No results for input(s): LIPASE, AMYLASE in the last 168 hours. No results for input(s): AMMONIA in the last 168 hours. Coagulation Profile: Recent Labs  Lab 11/11/17 0600 11/12/17 0512  INR 1.25 1.39   Cardiac Enzymes: No results for input(s): CKTOTAL, CKMB, CKMBINDEX, TROPONINI in the last 168 hours. BNP  (last 3 results) No results for input(s): PROBNP in the last 8760 hours. HbA1C: No results for input(s): HGBA1C in the last 72 hours. CBG: Recent Labs  Lab 11/12/17 1206 11/12/17 1718 11/12/17 2057 11/13/17 0600 11/13/17 0758  GLUCAP 122* 149* 166* 140* 148*   Lipid Profile: No results for input(s): CHOL, HDL, LDLCALC, TRIG, CHOLHDL, LDLDIRECT in the last 72 hours. Thyroid Function Tests: No results for input(s): TSH, T4TOTAL, FREET4, T3FREE, THYROIDAB in the last 72 hours. Anemia Panel: No results for input(s): VITAMINB12, FOLATE, FERRITIN, TIBC, IRON, RETICCTPCT in the last 72 hours. Urine analysis:    Component Value Date/Time   COLORURINE YELLOW 11/11/2017 2221   APPEARANCEUR HAZY (A) 11/11/2017 2221   LABSPEC 1.020 11/11/2017 2221   PHURINE 5.0 11/11/2017 2221   GLUCOSEU >=500 (A) 11/11/2017 2221   HGBUR NEGATIVE 11/11/2017 2221   BILIRUBINUR NEGATIVE 11/11/2017 2221  KETONESUR 5 (A) 11/11/2017 2221   PROTEINUR NEGATIVE 11/11/2017 2221   UROBILINOGEN 0.2 10/23/2014 0830   NITRITE NEGATIVE 11/11/2017 2221   LEUKOCYTESUR TRACE (A) 11/11/2017 2221   Sepsis Labs: @LABRCNTIP (procalcitonin:4,lacticidven:4)  ) Recent Results (from the past 240 hour(s))  Blood Culture (routine x 2)     Status: None (Preliminary result)   Collection Time: 11/11/17  8:03 AM  Result Value Ref Range Status   Specimen Description BLOOD LEFT ANTECUBITAL  Final   Special Requests IN PEDIATRIC BOTTLE Blood Culture adequate volume  Final   Culture   Final    NO GROWTH 1 DAY Performed at [facility], [address] [place], [place] [address]    Report Status PENDING  Incomplete  Blood Culture (routine x 2)     Status: None (Preliminary result)   Collection Time: 11/11/17 11:53 AM  Result Value Ref Range Status   Specimen Description BLOOD BLOOD LEFT HAND  Final   Special Requests IN PEDIATRIC BOTTLE Blood Culture adequate volume  Final   Culture   Final    NO GROWTH 1  DAY Performed at [facility], [address] [place], [place] [address]    Report Status PENDING  Incomplete  C difficile quick scan w PCR reflex     Status: None   Collection Time: 11/11/17  3:55 PM  Result Value Ref Range Status   C Diff antigen NEGATIVE NEGATIVE Final   C Diff toxin NEGATIVE NEGATIVE Final   C Diff interpretation No C. difficile detected.  Final    Comment: Performed at [facility], [address] [place], [place] [address]  Urine culture     Status: Abnormal (Preliminary result)   Collection Time: 11/11/17 10:06 PM  Result Value Ref Range Status   Specimen Description URINE, RANDOM  Final   Special Requests NONE  Final   Culture (A)  Final    30,000 COLONIES/mL UNIDENTIFIED ORGANISM Performed at [facility], [address] [place], [place] [address]    Report Status PENDING  Incomplete      Radiology Studies: No results found.   Scheduled Meds: . aspirin EC  81 mg Oral Daily  . clopidogrel  75 mg Oral Q breakfast  . docusate sodium  100 mg Oral BID  . feeding supplement (PRO-STAT SUGAR FREE 64)  30 mL Oral TID WC & HS  . ferrous sulfate  325 mg Oral Q breakfast  . heparin  5,000 Units Subcutaneous Q8H  . lactobacillus acidophilus  2 tablet Oral TID  . memantine  14 mg Oral QHS  . multivitamin with minerals  1 tablet Oral Daily  . nystatin   Topical BID  . protein supplement shake  2 oz Oral TID WC & HS  . rosuvastatin  5 mg Oral Q M,W,F  . sodium chloride  1 g Oral Daily  . vitamin C  500 mg Oral Daily  . zinc oxide  1 application Topical Daily   Continuous Infusions: . ampicillin-sulbactam (UNASYN) IV Stopped (11/13/17 0230)  . dextrose 5 % and 0.45% NaCl 75 mL/hr at 11/13/17 0430  . lactated ringers    . valproate sodium Stopped (11/13/17 1037)     LOS: 2 days   Time Spent in minutes   30 minutes  Frank Novelo D.O. on 11/13/2017 at 11:13 AM  Between 7am to 7pm - Pager - [phone]  After 7pm go to  [email or web address] - password [email or web address]  And look  for the night coverage person covering for me after hours  [facility] Office  [phone]

## 2017-11-14 DIAGNOSIS — F0390 Unspecified dementia without behavioral disturbance: Secondary | ICD-10-CM

## 2017-11-14 LAB — BASIC METABOLIC PANEL
ANION GAP: 7 (ref 5–15)
BUN: 10 mg/dL (ref 6–20)
CALCIUM: 8.6 mg/dL — AB (ref 8.9–10.3)
CO2: 23 mmol/L (ref 22–32)
Chloride: 109 mmol/L (ref 101–111)
Creatinine, Ser: 0.63 mg/dL (ref 0.44–1.00)
GLUCOSE: 194 mg/dL — AB (ref 65–99)
POTASSIUM: 3.6 mmol/L (ref 3.5–5.1)
Sodium: 139 mmol/L (ref 135–145)

## 2017-11-14 LAB — CBC
HCT: 28.9 % — ABNORMAL LOW (ref 36.0–46.0)
Hemoglobin: 8.8 g/dL — ABNORMAL LOW (ref 12.0–15.0)
MCH: 27.6 pg (ref 26.0–34.0)
MCHC: 30.4 g/dL (ref 30.0–36.0)
MCV: 90.6 fL (ref 78.0–100.0)
PLATELETS: 365 10*3/uL (ref 150–400)
RBC: 3.19 MIL/uL — AB (ref 3.87–5.11)
RDW: 18.2 % — ABNORMAL HIGH (ref 11.5–15.5)
WBC: 8.6 10*3/uL (ref 4.0–10.5)

## 2017-11-14 LAB — URINE CULTURE

## 2017-11-14 LAB — GLUCOSE, CAPILLARY
GLUCOSE-CAPILLARY: 183 mg/dL — AB (ref 65–99)
Glucose-Capillary: 205 mg/dL — ABNORMAL HIGH (ref 65–99)
Glucose-Capillary: 214 mg/dL — ABNORMAL HIGH (ref 65–99)
Glucose-Capillary: 224 mg/dL — ABNORMAL HIGH (ref 65–99)
Glucose-Capillary: 228 mg/dL — ABNORMAL HIGH (ref 65–99)

## 2017-11-14 MED ORDER — INSULIN ASPART 100 UNIT/ML ~~LOC~~ SOLN
0.0000 [IU] | Freq: Three times a day (TID) | SUBCUTANEOUS | Status: DC
  Administered 2017-11-14: 3 [IU] via SUBCUTANEOUS
  Administered 2017-11-15: 2 [IU] via SUBCUTANEOUS
  Administered 2017-11-15: 3 [IU] via SUBCUTANEOUS
  Administered 2017-11-15 – 2017-11-16 (×4): 2 [IU] via SUBCUTANEOUS
  Administered 2017-11-17 (×2): 3 [IU] via SUBCUTANEOUS

## 2017-11-14 MED ORDER — GUAIFENESIN ER 600 MG PO TB12
600.0000 mg | ORAL_TABLET | Freq: Two times a day (BID) | ORAL | Status: DC
  Administered 2017-11-14 – 2017-11-15 (×3): 600 mg via ORAL
  Filled 2017-11-14 (×3): qty 1

## 2017-11-14 MED ORDER — INSULIN ASPART 100 UNIT/ML ~~LOC~~ SOLN
0.0000 [IU] | Freq: Every day | SUBCUTANEOUS | Status: DC
  Administered 2017-11-14: 2 [IU] via SUBCUTANEOUS

## 2017-11-14 NOTE — Progress Notes (Signed)
  Speech Language Pathology Treatment: Dysphagia  Patient Details Name: Megan [name] Forster MRN: [id number] DOB: 1922/02/22 Today's Date: 11/14/2017 Time: [id number] SLP Time Calculation (min) (ACUTE ONLY): 18 min  Assessment / Plan / Recommendation Clinical Impression  Skilled observation of consumption of current diet of Dysphagia 1 (puree)/thin liquids via straw with moderate verbal/tactile cueing provided by SLP for effortful swallow, initiation of swallow when oral holding noted and placement of bolus on left side of oral cavity to limit pocketing of food/liquids as able d/t primarily cognitive-based dysphagia symptoms paired with right oral weakness; anterior spillage noted on right d/t positioning initially and when oral holding/pocketing observed; family educated re: positioning, maintaining alertness level and encouraging initiation of swallow prior to providing another bites/sip as well as, noting vocal quality intermittently during PO intake to reduce/eliminate aspiration risk with pt; pt's consumption was minimal, but no overt s/s of aspiration noted during intake; ST will continue to f/u for diet tolerance/aspiration/swallowing precaution training with family/caregivers while in acute setting.  HPI HPI: [name] who is presenting from home for worsening mental status, nausea vomiting, diarrhea, possible aspiration by reported her daughter      SLP Plan  Continue with current plan of care       Recommendations  Diet recommendations: Dysphagia 1 (puree);Thin liquid Liquids provided via: Cup;Straw Medication Administration: Crushed with puree Supervision: Full supervision/cueing for compensatory strategies;Trained caregiver to feed patient Compensations: Minimize environmental distractions;Slow rate;Small sips/bites;Monitor for anterior loss;Effortful swallow Postural Changes and/or Swallow Maneuvers: Seated upright 90 degrees                Oral Care  Recommendations: Oral care BID Follow up Recommendations: 24 hour supervision/assistance SLP Visit Diagnosis: Dysphagia, oropharyngeal phase (R13.12) Plan: Continue with current plan of care                       Tressie StalkerPat Theoplis Garciagarcia, M.S., CCC-SLP 11/14/2017, 3:24 PM

## 2017-11-14 NOTE — Progress Notes (Signed)
Pt. Blood sugar has been elevated. MD notified to order insulin. Will continue to monitor.

## 2017-11-14 NOTE — Progress Notes (Addendum)
[facility])  Received request from [place] 11/13/17 for family interest in [facility] services at home after discharge. Met with family yesterday and today. Eligibility has been confirmed. No DME needs identified at this time. Ambulance transport has been requested by daughter. Daughter has [facility] contact information. RNCM Angela aware.   Please send signed and completed out of facility DNR home with patient.  Please send scripts for any medication patient does not already have including comfort medications.   [facility] referral specialist will contact family to arrange visit at home after discharge.   Please do not hesitate to call with hospice related questions.   Thank you,  Erling Conte, LCSW [phone]

## 2017-11-14 NOTE — Progress Notes (Signed)
Received consult for prayer.  Patient not able to hear/have conversation.  Person at bedside wrote notes and told her I had come to pray with her.  Had prayer for peace of mind and to feel God's presence with her=she is not alone and for staff caring for her to have wisdom and guidance as they use their skills to care for her.  Phebe CollaDonna S Justin Meisenheimer, Chaplain   11/14/17 1000  Clinical Encounter Type  Visited With Patient;Other (Comment) (Person helping care for her -writing to communicate)  Visit Type Initial  Referral From Nurse  Consult/Referral To Chaplain  Spiritual Encounters  Spiritual Needs Prayer  Stress Factors  Patient Stress Factors None identified  Family Stress Factors None identified

## 2017-11-14 NOTE — Progress Notes (Signed)
Patient ID: Megan PersonRuby Rayon, female   DOB: [birth year], 84 y.o.   MRN: [id number]  PROGRESS NOTE    Megan [name]  YQM:[id number] DOB: [birth year] DOA: 11/11/2017 PCP: [name], John, MD   Brief Narrative:  [name] with history of hyperlipidemia, diabetes mellitus type 2, CVA with left-sided weakness, seizure disorder, dementia, diabetes ulcers, pneumonia presented on 11/11/2017 with worsening mental status, nausea, vomiting, diarrhea and possible aspiration and was admitted secondary to sepsis secondary to aspiration pneumonia.   Assessment & Plan:   Principal Problem:   Sepsis (HCC) Active Problems:   TIA (transient ischemic attack)   Right upper lobe pneumonia (HCC)   Diabetes mellitus type II, uncontrolled (HCC)   HTN (hypertension)   History of partial seizures   History of CVA w/residual right side deficits   Protein-calorie malnutrition, severe (HCC)   Anemia   Dementia with Lewy bodies   Sepsis secondary to aspiration pneumonia -Presented with tachypnea, tachycardia, leukocytosis, elevated lactic acid level- all have resolved -Chest x-ray: resolved RUL pneumonia. Left and right basilar opacities  -Was placed on vancomycin and cefepime however given aspiration possibility, transitioned to unasyn.  Will switch to oral Augmentin probably tomorrow to finish 5-7-day course -Diet as per SLP recommendations -Discontinue IV fluids  Diarrhea/dehydration -C. difficile PCR negative -Discontinue IV fluids -We will use Imodium as needed  History of CVA - Resume Plavix and statin -Patient with residual right-sided deficits  Diabetes mellitus, type II -Continue Accu-Cheks with sliding scale coverage  Seizure disorder -Continue depacon  Chronic anemia -Hemoglobin stable  Dementia -Continue Namenda -Suspect will continue to worsen given patient's age and comorbidities with frequent hospitalizations.    Goals of care -  Currently DNR -Patient has had  5 hospitalizations over the past 6 months -Palliative care following: Plan for probable discharge with home hospice tomorrow.  Will not repeat a.m. labs    DVT prophylaxis: Heparin Code Status: DNR Family Communication: Spoke to daughter at bedside Disposition Plan: Probable Home with hospice in a.m.  Consultants: Palliative care  Procedures: None  Antimicrobials:  Anti-infectives (From admission, onward)   Start     Dose/Rate Route Frequency Ordered Stop   11/13/17 1400  ampicillin-sulbactam (UNASYN) 1.5 g in sodium chloride 0.9 % 100 mL IVPB     1.5 g 200 mL/hr over 30 Minutes Intravenous Every 8 hours 11/13/17 1308     11/12/17 1400  ampicillin-sulbactam (UNASYN) 1.5 g in sodium chloride 0.9 % 100 mL IVPB  Status:  Discontinued     1.5 g 200 mL/hr over 30 Minutes Intravenous Every 12 hours 11/12/17 1302 11/13/17 1308   11/12/17 0800  vancomycin (VANCOCIN) IVPB 1000 mg/200 mL premix  Status:  Discontinued     1,000 mg 200 mL/hr over 60 Minutes Intravenous Every 24 hours 11/11/17 1055 11/12/17 1229   11/12/17 0700  ceFEPIme (MAXIPIME) 1 g in sodium chloride 0.9 % 100 mL IVPB  Status:  Discontinued     1 g 200 mL/hr over 30 Minutes Intravenous Every 24 hours 11/11/17 1052 11/12/17 1229   11/11/17 2200  ceFEPIme (MAXIPIME) 1 g in sodium chloride 0.9 % 100 mL IVPB  Status:  Discontinued     1 g 200 mL/hr over 30 Minutes Intravenous Every 12 hours 11/11/17 0801 11/11/17 1052   11/11/17 0645  ceFEPIme (MAXIPIME) 2 g in sodium chloride 0.9 % 100 mL IVPB     2 g 200 mL/hr over 30 Minutes Intravenous  Once 11/11/17 0639 11/11/17 0731   11/11/17 0645  vancomycin (VANCOCIN) IVPB 1000 mg/200 mL premix     1,000 mg 200 mL/hr over 60 Minutes Intravenous  Once 11/11/17 5621 11/11/17 0841        Subjective: Patient is seen and examined at bedside.  She is awake but hardly answers any questions.  Spoke to patient's daughter at bedside.  No overnight fever or  vomiting  Objective: Vitals:   11/13/17 1645 11/13/17 1729 11/13/17 2100 11/14/17 1528  BP: (!) 94/52 (!) 103/59 109/69 (!) 144/52  Pulse: 82  75 89  Resp: 20  19 18   Temp:   97.6 F (36.4 C)   TempSrc:   Axillary   SpO2: 100%  100% 100%  Weight:        Intake/Output Summary (Last 24 hours) at 11/14/2017 1546 Last data filed at 11/14/2017 1500 Gross per 24 hour  Intake 2997.5 ml  Output 500 ml  Net 2497.5 ml   Filed Weights   11/12/17 0524  Weight: 68.6 kg (151 lb 3.8 oz)    Examination:  General exam: Elderly female lying in bed, no acute distress.  Awake but confused Respiratory system: Bilateral decreased breath sound at bases Cardiovascular system: S1 & S2 heard, rate controlled  gastrointestinal system: Abdomen is nondistended, soft and nontender. Normal bowel sounds heard. Extremities: No cyanosis, clubbing, edema   Data Reviewed: I have personally reviewed following labs and imaging studies  CBC: Recent Labs  Lab 11/11/17 0600 11/11/17 1633 11/12/17 0512 11/13/17 0641 11/14/17 0821  WBC 13.4* 13.3* 10.1 7.7 8.6  NEUTROABS 9.3*  --   --   --   --   HGB 11.9* 9.9* 9.1* 8.8* 8.8*  HCT 38.2 32.4* 29.3* 28.2* 28.9*  MCV 91.8 92.3 92.1 92.2 90.6  PLT 468* 413* 367 355 365   Basic Metabolic Panel: Recent Labs  Lab 11/11/17 0600 11/11/17 1633 11/12/17 0857 11/13/17 0641 11/14/17 0821  NA 141  --  137 141 139  K 5.1  --  3.5 3.8 3.6  CL 108  --  102 113* 109  CO2 22  --  23 23 23   GLUCOSE 242*  --  147* 148* 194*  BUN 35*  --  10 11 10   CREATININE 0.92 0.85 1.18* 0.63 0.63  CALCIUM 9.8 8.6* 9.3 8.5* 8.6*  MG  --  1.9  --   --   --   PHOS  --  2.4*  --   --   --    GFR: Estimated Creatinine Clearance: 39.9 mL/min (by C-G formula based on SCr of 0.63 mg/dL). Liver Function Tests: Recent Labs  Lab 11/11/17 0600  AST 19  ALT 10*  ALKPHOS 113  BILITOT 0.1*  PROT 7.7  ALBUMIN 2.4*   No results for input(s): LIPASE, AMYLASE in the last 168  hours. No results for input(s): AMMONIA in the last 168 hours. Coagulation Profile: Recent Labs  Lab 11/11/17 0600 11/12/17 0512  INR 1.25 1.39   Cardiac Enzymes: No results for input(s): CKTOTAL, CKMB, CKMBINDEX, TROPONINI in the last 168 hours. BNP (last 3 results) No results for input(s): PROBNP in the last 8760 hours. HbA1C: No results for input(s): HGBA1C in the last 72 hours. CBG: Recent Labs  Lab 11/13/17 1734 11/13/17 2039 11/14/17 0050 11/14/17 0822 11/14/17 1226  GLUCAP 210* 219* 205* 183* 228*   Lipid Profile: No results for input(s): CHOL, HDL, LDLCALC, TRIG, CHOLHDL, LDLDIRECT in the last 72 hours. Thyroid Function Tests: No results for input(s): TSH, T4TOTAL, FREET4, T3FREE, THYROIDAB in  the last 72 hours. Anemia Panel: No results for input(s): VITAMINB12, FOLATE, FERRITIN, TIBC, IRON, RETICCTPCT in the last 72 hours. Sepsis Labs: Recent Labs  Lab 11/11/17 0609 11/11/17 0729 11/11/17 0810 11/11/17 1153 11/12/17 0857  PROCALCITON  --  <0.10  --   --   --   LATICACIDVEN 2.45*  --  1.6 2.9* 1.1    Recent Results (from the past 240 hour(s))  Blood Culture (routine x 2)     Status: None (Preliminary result)   Collection Time: 11/11/17  8:03 AM  Result Value Ref Range Status   Specimen Description BLOOD LEFT ANTECUBITAL  Final   Special Requests IN PEDIATRIC BOTTLE Blood Culture adequate volume  Final   Culture   Final    NO GROWTH 3 DAYS Performed at [facility], [address] [place], [place] [address]    Report Status PENDING  Incomplete  Blood Culture (routine x 2)     Status: None (Preliminary result)   Collection Time: 11/11/17 11:53 AM  Result Value Ref Range Status   Specimen Description BLOOD BLOOD LEFT HAND  Final   Special Requests IN PEDIATRIC BOTTLE Blood Culture adequate volume  Final   Culture   Final    NO GROWTH 3 DAYS Performed at [facility], [address], [place], [place] [address]    Report Status PENDING   Incomplete  C difficile quick scan w PCR reflex     Status: None   Collection Time: 11/11/17  3:55 PM  Result Value Ref Range Status   C Diff antigen NEGATIVE NEGATIVE Final   C Diff toxin NEGATIVE NEGATIVE Final   C Diff interpretation No C. difficile detected.  Final    Comment: Performed at [facility], [address], [place], [place] [address]  Urine culture     Status: Abnormal   Collection Time: 11/11/17 10:06 PM  Result Value Ref Range Status   Specimen Description URINE, RANDOM  Final   Special Requests   Final    NONE Performed at [facility], [address], [place], [place] [address]    Culture 30,000 COLONIES/mL ENTEROCOCCUS FAECALIS (A)  Final   Report Status 11/14/2017 FINAL  Final   Organism ID, Bacteria ENTEROCOCCUS FAECALIS (A)  Final      Susceptibility   Enterococcus faecalis - MIC*    AMPICILLIN <=2 SENSITIVE Sensitive     LEVOFLOXACIN 1 SENSITIVE Sensitive     NITROFURANTOIN <=16 SENSITIVE Sensitive     VANCOMYCIN 2 SENSITIVE Sensitive     * 30,000 COLONIES/mL ENTEROCOCCUS FAECALIS         Radiology Studies: Dg Swallowing Func-speech Pathology  Result Date: 11/13/2017 Objective Swallowing Evaluation: Type of Study: MBS-Modified Barium Swallow Study  Patient Details Name: Jeniya Flannigan MRN: [id number] Date of Birth: 06-28-1922 Today's Date: 11/13/2017 Time: SLP Start Time (ACUTE ONLY): 1135 -SLP Stop Time (ACUTE ONLY): 1205 SLP Time Calculation (min) (ACUTE ONLY): 30 min Past Medical History: Past Medical History: Diagnosis Date . Diabetes mellitus  . Hyperlipidemia  . Seizure (HCC)  . Stroke [facility]) 2012  possible stroke Jan 2016 . UTI (urinary tract infection) 10/2017 Past Surgical History: Past Surgical History: Procedure Laterality Date . CATARACT EXTRACTION   HPI: [name] who is presenting from home for worsening mental status, nausea vomiting, diarrhea, possible aspiration by reported her daughter  No data recorded  Assessment / Plan / Recommendation CHL IP CLINICAL IMPRESSIONS 11/13/2017 Clinical Impression Pt currently  presenting with a severe oral phase, mild pharyngeal phase, cognitive-based dysphagia. Pt with oral holding of puree bolus greater than 30 seconds, along with minimal oral residuals post-swallow. With both nectar-thick and thin liquid consistencies, pt had inconsistent timing of swallow initiation, sometimes premature spill with bolus filling the vallecula and spilling over to pyriform sinuses prior to swallow. Despite this, pt had excellent airway protection and showed only one instance of flash penetration with nectar thick liquids but no other penetration or aspiration observed, no significant pharyngeal residuals, occasional oral residuals post-swallow. Pt was easily distracted during study, frequently looking down at IV and turning to look at video. While pt did not show any penetration or aspiration during this study, pt does continue to be at an increased risk due to cognitive status and oral phase deficits observed- decreased attention to bolus or oral residuals post-swallow. Daughter present for study- feels that pt coughs when drinking water but less coughing with thicker liquids such as protein shake. Recommend initiating dysphagia 1 diet, thin liquids, meds crushed in puree, or whole in puree if unable to crush, full supervision- ensure pt fully upright (in chair if possible), allow to self-feed to the extent she is able, monitor oral holding, ensure pt has swallowed bolus before providing additional food, minimize distractions, oral care before/ after meals. If frequent s/s of aspiration at bedside with thin liquids, may consider downgrade to nectar-thick, although during MBS there was essentially no benefit, as the timing of swallow initiation was variable with both. SLP will continue to follow for diet tolerance/ education.  SLP Visit Diagnosis Dysphagia, oropharyngeal phase (R13.12) Attention and  concentration deficit following -- Frontal lobe and executive function deficit following -- Impact on safety and function Moderate aspiration risk   CHL IP TREATMENT RECOMMENDATION 11/13/2017 Treatment Recommendations Therapy as outlined in treatment plan below   Prognosis 11/13/2017 Prognosis for Safe Diet Advancement Fair Barriers to Reach Goals Cognitive deficits Barriers/Prognosis Comment -- CHL IP DIET RECOMMENDATION 11/13/2017 SLP Diet Recommendations Dysphagia 1 (Puree) solids;Thin liquid Liquid Administration via Straw Medication Administration Crushed with puree Compensations Minimize environmental distractions;Slow rate;Small sips/bites;Lingual sweep for clearance of pocketing Postural Changes Remain semi-upright after after feeds/meals (Comment);Seated upright at 90 degrees   CHL IP OTHER RECOMMENDATIONS 11/13/2017 Recommended Consults -- Oral Care Recommendations Oral care before and after PO Other Recommendations Clarify dietary restrictions   CHL IP FOLLOW UP RECOMMENDATIONS 11/13/2017 Follow up Recommendations 24 hour supervision/assistance   CHL IP FREQUENCY AND DURATION 11/13/2017 Speech Therapy Frequency (ACUTE ONLY) min 2x/week Treatment Duration 1 week      CHL IP ORAL PHASE 11/13/2017 Oral Phase Impaired Oral - Pudding Teaspoon -- Oral - Pudding Cup -- Oral - Honey Teaspoon -- Oral - Honey Cup -- Oral - Nectar Teaspoon -- Oral - Nectar Cup -- Oral - Nectar Straw Lingual/palatal residue;Delayed oral transit;Decreased bolus cohesion;Premature spillage Oral - Thin Teaspoon -- Oral - Thin Cup -- Oral - Thin Straw Lingual/palatal residue;Delayed oral transit;Decreased bolus cohesion;Premature spillage Oral - Puree Holding of bolus;Lingual/palatal residue;Delayed oral transit Oral - Mech Soft -- Oral - Regular -- Oral - Multi-Consistency -- Oral - Pill -- Oral Phase - Comment --  CHL IP PHARYNGEAL PHASE 11/13/2017 Pharyngeal Phase Impaired Pharyngeal- Pudding Teaspoon -- Pharyngeal -- Pharyngeal- Pudding Cup  -- Pharyngeal -- Pharyngeal- Honey Teaspoon -- Pharyngeal -- Pharyngeal- Honey Cup -- Pharyngeal -- Pharyngeal- Nectar Teaspoon -- Pharyngeal -- Pharyngeal- Nectar Cup -- Pharyngeal -- Pharyngeal- Nectar Straw Delayed swallow initiation-pyriform sinuses;Penetration/Aspiration before swallow Pharyngeal Material enters  airway, remains ABOVE vocal cords then ejected out Pharyngeal- Thin Teaspoon -- Pharyngeal -- Pharyngeal- Thin Cup -- Pharyngeal -- Pharyngeal- Thin Straw Delayed swallow initiation-pyriform sinuses Pharyngeal -- Pharyngeal- Puree WFL Pharyngeal -- Pharyngeal- Mechanical Soft -- Pharyngeal -- Pharyngeal- Regular -- Pharyngeal -- Pharyngeal- Multi-consistency -- Pharyngeal -- Pharyngeal- Pill -- Pharyngeal -- Pharyngeal Comment --  No flowsheet data found. No flowsheet data found. Metro Kung, MA, CCC-SLP 11/13/2017, 12:47 PM [phone]                   Scheduled Meds: . aspirin EC  81 mg Oral Daily  . clopidogrel  75 mg Oral Q breakfast  . docusate sodium  100 mg Oral BID  . feeding supplement (PRO-STAT SUGAR FREE 64)  30 mL Oral TID WC & HS  . ferrous sulfate  325 mg Oral Q breakfast  . heparin  5,000 Units Subcutaneous Q8H  . insulin aspart  0-5 Units Subcutaneous QHS  . insulin aspart  0-9 Units Subcutaneous TID WC  . lactobacillus acidophilus  2 tablet Oral TID  . memantine  14 mg Oral QHS  . multivitamin with minerals  1 tablet Oral Daily  . nystatin   Topical BID  . protein supplement shake  2 oz Oral TID WC & HS  . rosuvastatin  5 mg Oral Q M,W,F  . sodium chloride  1 g Oral Daily  . vitamin C  500 mg Oral Daily  . zinc oxide  1 application Topical Daily   Continuous Infusions: . ampicillin-sulbactam (UNASYN) IV 1.5 g (11/14/17 0523)  . dextrose 5 % and 0.45% NaCl 75 mL/hr at 11/14/17 0137  . lactated ringers    . valproate sodium Stopped (11/14/17 1252)     LOS: 3 days        Glade Lloyd, MD [facility] Pager [phone]  If 7PM-7AM, please  contact night-coverage [email or web address] Password [facility] 11/14/2017, 3:46 PM

## 2017-11-15 ENCOUNTER — Inpatient Hospital Stay (HOSPITAL_COMMUNITY): Payer: Medicare Other

## 2017-11-15 DIAGNOSIS — A419 Sepsis, unspecified organism: Principal | ICD-10-CM

## 2017-11-15 LAB — BASIC METABOLIC PANEL
Anion gap: 7 (ref 5–15)
BUN: 12 mg/dL (ref 6–20)
CO2: 23 mmol/L (ref 22–32)
CREATININE: 0.6 mg/dL (ref 0.44–1.00)
Calcium: 8.8 mg/dL — ABNORMAL LOW (ref 8.9–10.3)
Chloride: 111 mmol/L (ref 101–111)
Glucose, Bld: 193 mg/dL — ABNORMAL HIGH (ref 65–99)
Potassium: 3.9 mmol/L (ref 3.5–5.1)
SODIUM: 141 mmol/L (ref 135–145)

## 2017-11-15 LAB — GLUCOSE, CAPILLARY
GLUCOSE-CAPILLARY: 188 mg/dL — AB (ref 65–99)
GLUCOSE-CAPILLARY: 204 mg/dL — AB (ref 65–99)
Glucose-Capillary: 184 mg/dL — ABNORMAL HIGH (ref 65–99)
Glucose-Capillary: 194 mg/dL — ABNORMAL HIGH (ref 65–99)
Glucose-Capillary: 238 mg/dL — ABNORMAL HIGH (ref 65–99)

## 2017-11-15 LAB — CBC
HCT: 29.9 % — ABNORMAL LOW (ref 36.0–46.0)
Hemoglobin: 9.5 g/dL — ABNORMAL LOW (ref 12.0–15.0)
MCH: 29.1 pg (ref 26.0–34.0)
MCHC: 31.8 g/dL (ref 30.0–36.0)
MCV: 91.4 fL (ref 78.0–100.0)
PLATELETS: 345 10*3/uL (ref 150–400)
RBC: 3.27 MIL/uL — AB (ref 3.87–5.11)
RDW: 18.8 % — ABNORMAL HIGH (ref 11.5–15.5)
WBC: 8.8 10*3/uL (ref 4.0–10.5)

## 2017-11-15 MED ORDER — FAMOTIDINE 20 MG PO TABS
20.0000 mg | ORAL_TABLET | Freq: Every day | ORAL | Status: DC
  Administered 2017-11-15 – 2017-11-17 (×3): 20 mg via ORAL
  Filled 2017-11-15 (×3): qty 1

## 2017-11-15 MED ORDER — BUDESONIDE 0.25 MG/2ML IN SUSP
0.2500 mg | Freq: Two times a day (BID) | RESPIRATORY_TRACT | Status: DC
  Administered 2017-11-16 – 2017-11-17 (×3): 0.25 mg via RESPIRATORY_TRACT
  Filled 2017-11-15 (×5): qty 2

## 2017-11-15 MED ORDER — VALPROATE SODIUM 250 MG/5ML PO SOLN
250.0000 mg | Freq: Two times a day (BID) | ORAL | Status: DC
  Administered 2017-11-15 – 2017-11-17 (×5): 250 mg via ORAL
  Filled 2017-11-15 (×6): qty 5

## 2017-11-15 MED ORDER — LEVALBUTEROL HCL 0.63 MG/3ML IN NEBU
0.6300 mg | INHALATION_SOLUTION | Freq: Three times a day (TID) | RESPIRATORY_TRACT | Status: DC
  Administered 2017-11-16 – 2017-11-17 (×5): 0.63 mg via RESPIRATORY_TRACT
  Filled 2017-11-15 (×5): qty 3

## 2017-11-15 MED ORDER — LEVALBUTEROL HCL 0.63 MG/3ML IN NEBU: 0.6300 mg | INHALATION_SOLUTION | Freq: Three times a day (TID) | RESPIRATORY_TRACT | Status: DC

## 2017-11-15 MED ORDER — BUDESONIDE 0.25 MG/2ML IN SUSP: 0.2500 mg | Freq: Two times a day (BID) | RESPIRATORY_TRACT | Status: DC

## 2017-11-15 NOTE — Care Management Important Message (Signed)
Important Message  Patient Details  Name: Megan PersonRuby Parillo MRN: [id number] Date of Birth: June 27, 1922   Medicare Important Message Given:  Yes    Myleah Cavendish Stefan ChurchBratton 11/15/2017, 2:02 PM

## 2017-11-15 NOTE — Progress Notes (Signed)
Over the night shift; family reported that patient was having dry cough. Family member requested Mucinex. NP on call notified. Order written. Med administered. Will continue to monitor.  Pt's family requested antiemetic for pt as she reported that pt was nauseous, but no visible emesis. Pt was administered IV Zofran.  Pt's family requested for pt to have Imodium at around midnight. Imodium given at 0009. Pt had another BM at around 3:30am. Family requested another dose of Imodium. Family was advised that pt could only get 4 does a day. Pharmacy was called to verify if administration of Imodium at the time appropriate. Pharmacist advised that Immodium could be given 3 hrs apart. Imodium given at 3:42. Will continue to monitor.

## 2017-11-15 NOTE — Progress Notes (Signed)
PROGRESS NOTE    Megan [name]  ZOX:[name] DOB: [birth year] DOA: 11/11/2017 PCP: [name], John, MD    Brief Narrative: [name] with history of hyperlipidemia, diabetes mellitus type 2, CVA with left-sided weakness, seizure disorder, dementia, diabetes ulcers, pneumonia presented on 11/11/2017 with worsening mental status, nausea, vomiting, diarrhea and possible aspiration and was admitted secondary to sepsis secondary to aspiration pneumonia.     Assessment & Plan:   Principal Problem:   Sepsis (HCC) Active Problems:   TIA (transient ischemic attack)   Right upper lobe pneumonia (HCC)   Diabetes mellitus type II, uncontrolled (HCC)   HTN (hypertension)   History of partial seizures   History of CVA w/residual right side deficits   Protein-calorie malnutrition, severe (HCC)   Anemia   Dementia with Lewy bodies  1-Aspiration Pneumonia.  Daughter report worsening cough overnight.  Check chest x ray. Showed BL pulmonary edema, vs pulmonitis. Will start Pulmicort.  IV fluids stopped yesterday.  Continue with IV antibiotic.  Daily weight, Strict I and o.  Hold on lasix due to poor oral intake and low BP.   Sepsis; related to aspiration PNA; vitals stable.   Diarrhea; c diff negative.  Received imodium.   History of CVA;  On plavix and statins.   DM; SSI.  Seizure, change Depacon to liquid form.   Dementia; on nemanda.   Goals of care. Daughter is not sure now about home with hospice.    DVT prophylaxis: Heparin.  Code Status: DNR Family Communication: daughter at bedside.  Disposition Plan; home when stable.    Consultants:   palliative   Procedures: none   Antimicrobials:unasyn    Subjective: She is sleepy, open eyes to voice. Daughter report patient was coughing a lot overnight. Received a dose of imdodium for diarrhea.   Objective: Vitals:   11/14/17 1528 11/14/17 2100 11/15/17 0620 11/15/17 1504  BP: (!) 144/52 (!)  136/52 (!) 114/58 (!) 96/38  Pulse: 89 92 84 69  Resp: 18 18 18 19   Temp:  98.3 F (36.8 C) 98.6 F (37 C) 98.7 F (37.1 C)  TempSrc:  Axillary Axillary Axillary  SpO2: 100% 100% 97% 99%  Weight:        Intake/Output Summary (Last 24 hours) at 11/15/2017 1641 Last data filed at 11/15/2017 1505 Gross per 24 hour  Intake 200 ml  Output 700 ml  Net -500 ml   Filed Weights   11/12/17 0524  Weight: 68.6 kg (151 lb 3.8 oz)    Examination:  General exam: Appears calm and comfortable  Respiratory system: bilateral crackles. Respiratory effort normal. Cardiovascular system: S1 & S2 heard, RRR. No JVD, murmurs, rubs, gallops or clicks. No pedal edema. Gastrointestinal system: Abdomen is nondistended, soft and nontender. No organomegaly or masses felt. Normal bowel sounds heard. Central nervous system:sleepy, open eyes to voice.  Extremities: Symmetric 5 x 5 power. Skin: No rashes, lesions or ulcers    Data Reviewed: I have personally reviewed following labs and imaging studies  CBC: Recent Labs  Lab 11/11/17 0600 11/11/17 1633 11/12/17 0512 11/13/17 0641 11/14/17 0821 11/15/17 1109  WBC 13.4* 13.3* 10.1 7.7 8.6 8.8  NEUTROABS 9.3*  --   --   --   --   --   HGB 11.9* 9.9* 9.1* 8.8* 8.8* 9.5*  HCT 38.2 32.4* 29.3* 28.2* 28.9* 29.9*  MCV 91.8 92.3 92.1 92.2 90.6 91.4  PLT 468* 413* 367 355 365 345   Basic Metabolic Panel: Recent Labs  Lab  11/11/17 0600 11/11/17 1633 11/12/17 0857 11/13/17 0641 11/14/17 0821 11/15/17 1109  NA 141  --  137 141 139 141  K 5.1  --  3.5 3.8 3.6 3.9  CL 108  --  102 113* 109 111  CO2 22  --  23 23 23 23   GLUCOSE 242*  --  147* 148* 194* 193*  BUN 35*  --  10 11 10 12   CREATININE 0.92 0.85 1.18* 0.63 0.63 0.60  CALCIUM 9.8 8.6* 9.3 8.5* 8.6* 8.8*  MG  --  1.9  --   --   --   --   PHOS  --  2.4*  --   --   --   --    GFR: Estimated Creatinine Clearance: 39.9 mL/min (by C-G formula based on SCr of 0.6 mg/dL). Liver Function  Tests: Recent Labs  Lab 11/11/17 0600  AST 19  ALT 10*  ALKPHOS 113  BILITOT 0.1*  PROT 7.7  ALBUMIN 2.4*   No results for input(s): LIPASE, AMYLASE in the last 168 hours. No results for input(s): AMMONIA in the last 168 hours. Coagulation Profile: Recent Labs  Lab 11/11/17 0600 11/12/17 0512  INR 1.25 1.39   Cardiac Enzymes: No results for input(s): CKTOTAL, CKMB, CKMBINDEX, TROPONINI in the last 168 hours. BNP (last 3 results) No results for input(s): PROBNP in the last 8760 hours. HbA1C: No results for input(s): HGBA1C in the last 72 hours. CBG: Recent Labs  Lab 11/14/17 1554 11/14/17 2149 11/15/17 0008 11/15/17 0759 11/15/17 1211  GLUCAP 214* 224* 238* 194* 188*   Lipid Profile: No results for input(s): CHOL, HDL, LDLCALC, TRIG, CHOLHDL, LDLDIRECT in the last 72 hours. Thyroid Function Tests: No results for input(s): TSH, T4TOTAL, FREET4, T3FREE, THYROIDAB in the last 72 hours. Anemia Panel: No results for input(s): VITAMINB12, FOLATE, FERRITIN, TIBC, IRON, RETICCTPCT in the last 72 hours. Sepsis Labs: Recent Labs  Lab 11/11/17 0609 11/11/17 0729 11/11/17 0810 11/11/17 1153 11/12/17 0857  PROCALCITON  --  <0.10  --   --   --   LATICACIDVEN 2.45*  --  1.6 2.9* 1.1    Recent Results (from the past 240 hour(s))  Blood Culture (routine x 2)     Status: None (Preliminary result)   Collection Time: 11/11/17  8:03 AM  Result Value Ref Range Status   Specimen Description BLOOD LEFT ANTECUBITAL  Final   Special Requests IN PEDIATRIC BOTTLE Blood Culture adequate volume  Final   Culture   Final    NO GROWTH 4 DAYS Performed at [facility], [address], [place], [place] [address]    Report Status PENDING  Incomplete  Blood Culture (routine x 2)     Status: None (Preliminary result)   Collection Time: 11/11/17 11:53 AM  Result Value Ref Range Status   Specimen Description BLOOD BLOOD LEFT HAND  Final   Special Requests IN PEDIATRIC BOTTLE Blood  Culture adequate volume  Final   Culture   Final    NO GROWTH 4 DAYS Performed at [facility], [address], [place], [place] [address]    Report Status PENDING  Incomplete  C difficile quick scan w PCR reflex     Status: None   Collection Time: 11/11/17  3:55 PM  Result Value Ref Range Status   C Diff antigen NEGATIVE NEGATIVE Final   C Diff toxin NEGATIVE NEGATIVE Final   C Diff interpretation No C. difficile detected.  Final  Comment: Performed at [facility], [address] [place], [place] [address]  Urine culture     Status: Abnormal   Collection Time: 11/11/17 10:06 PM  Result Value Ref Range Status   Specimen Description URINE, RANDOM  Final   Special Requests   Final    NONE Performed at [facility], [address] [place], [place] [address]    Culture 30,000 COLONIES/mL ENTEROCOCCUS FAECALIS (A)  Final   Report Status 11/14/2017 FINAL  Final   Organism ID, Bacteria ENTEROCOCCUS FAECALIS (A)  Final      Susceptibility   Enterococcus faecalis - MIC*    AMPICILLIN <=2 SENSITIVE Sensitive     LEVOFLOXACIN 1 SENSITIVE Sensitive     NITROFURANTOIN <=16 SENSITIVE Sensitive     VANCOMYCIN 2 SENSITIVE Sensitive     * 30,000 COLONIES/mL ENTEROCOCCUS FAECALIS         Radiology Studies: Dg Chest 2 View  Result Date: 11/15/2017 CLINICAL DATA:  Cough, previous episodes of pneumonia, TIA-CVA. EXAM: CHEST - 2 VIEW COMPARISON:  Portable chest x-ray of November 11, 2017. FINDINGS: The lungs are adequately inflated. The interstitial markings remain increased. Hazy increased density at both lung bases is present and not greatly changed. The cardiac silhouette is top-normal in size. The pulmonary vascularity is not clearly engorged. There is calcification in the wall of the aortic arch. There is multilevel degenerative disc disease of the thoracic spine. IMPRESSION: Bibasilar atelectasis or pneumonia. Increased interstitial prominence bilaterally may reflect  interstitial edema or pneumonitis type pattern. Electronically Signed   By: David  [place] M.D.   On: 11/15/2017 13:24        Scheduled Meds: . aspirin EC  81 mg Oral Daily  . budesonide (PULMICORT) nebulizer solution  0.25 mg Nebulization BID  . clopidogrel  75 mg Oral Q breakfast  . famotidine  20 mg Oral Daily  . feeding supplement (PRO-STAT SUGAR FREE 64)  30 mL Oral TID WC & HS  . ferrous sulfate  325 mg Oral Q breakfast  . guaiFENesin  600 mg Oral BID  . heparin  5,000 Units Subcutaneous Q8H  . insulin aspart  0-5 Units Subcutaneous QHS  . insulin aspart  0-9 Units Subcutaneous TID WC  . lactobacillus acidophilus  2 tablet Oral TID  . memantine  14 mg Oral QHS  . multivitamin with minerals  1 tablet Oral Daily  . nystatin   Topical BID  . protein supplement shake  2 oz Oral TID WC & HS  . rosuvastatin  5 mg Oral Q M,W,F  . sodium chloride  1 g Oral Daily  . Valproate Sodium  250 mg Oral BID  . vitamin C  500 mg Oral Daily  . zinc oxide  1 application Topical Daily   Continuous Infusions: . ampicillin-sulbactam (UNASYN) IV Stopped (11/15/17 1526)     LOS: 4 days    Time spent: 35 minutes.     Alba Cory, MD [facility] Pager [phone]  If 7PM-7AM, please contact night-coverage [email or web address] Password [facility] 11/15/2017, 4:41 PM

## 2017-11-16 ENCOUNTER — Inpatient Hospital Stay (HOSPITAL_COMMUNITY): Payer: Medicare Other

## 2017-11-16 DIAGNOSIS — R609 Edema, unspecified: Secondary | ICD-10-CM

## 2017-11-16 DIAGNOSIS — M79609 Pain in unspecified limb: Secondary | ICD-10-CM

## 2017-11-16 DIAGNOSIS — Z7189 Other specified counseling: Secondary | ICD-10-CM

## 2017-11-16 DIAGNOSIS — M7989 Other specified soft tissue disorders: Secondary | ICD-10-CM

## 2017-11-16 LAB — CULTURE, BLOOD (ROUTINE X 2)
CULTURE: NO GROWTH
Culture: NO GROWTH
SPECIAL REQUESTS: ADEQUATE
SPECIAL REQUESTS: ADEQUATE

## 2017-11-16 LAB — GLUCOSE, CAPILLARY
GLUCOSE-CAPILLARY: 179 mg/dL — AB (ref 65–99)
Glucose-Capillary: 182 mg/dL — ABNORMAL HIGH (ref 65–99)
Glucose-Capillary: 171 mg/dL — ABNORMAL HIGH (ref 65–99)
Glucose-Capillary: 200 mg/dL — ABNORMAL HIGH (ref 65–99)

## 2017-11-16 MED ORDER — DIPHENOXYLATE-ATROPINE 2.5-0.025 MG/5ML PO LIQD
5.0000 mL | Freq: Four times a day (QID) | ORAL | Status: DC | PRN
  Administered 2017-11-17: 5 mL via ORAL
  Filled 2017-11-16: qty 5

## 2017-11-16 MED ORDER — ASPIRIN 325 MG PO TABS
325.0000 mg | ORAL_TABLET | Freq: Every day | ORAL | Status: DC
  Administered 2017-11-16 – 2017-11-17 (×2): 325 mg via ORAL
  Filled 2017-11-16 (×2): qty 1

## 2017-11-16 MED ORDER — GUAIFENESIN 100 MG/5ML PO SOLN
10.0000 mL | Freq: Four times a day (QID) | ORAL | Status: DC
  Administered 2017-11-16 – 2017-11-17 (×5): 200 mg via ORAL
  Filled 2017-11-16 (×3): qty 10
  Filled 2017-11-16: qty 5
  Filled 2017-11-16: qty 10
  Filled 2017-11-16: qty 5

## 2017-11-16 MED ORDER — IBUPROFEN 100 MG/5ML PO SUSP: 400.0000 mg | Freq: Four times a day (QID) | ORAL | Status: DC | PRN

## 2017-11-16 NOTE — Progress Notes (Signed)
Visited at bedside with Patient and her daughter Megan [name].  We conferenced daughter Megan Bibleat Dondra Spry([name]) in by telephone.  We discussed the patient's recurrent aspiration.  This is going to continue.  [name] understands.  We also discussed her on-going diarrhea and new left arm blood clot.  Megan Bibleat anticipates that her mother will be discharged on some type of antibiotic therapy.  [name] expressed a desire to speak with the Doctor on 3/28 prior to her mother's discharge to be certain she understands the plan of care for her mother's blood clot.  If the patient remains stable her family would like for her to be discharged to home tomorrow afternoon with Hospice Services.  [facility].   Patient is sleeping comfortably.  She opens her eyes to my exam but does not speak.  Megan RichardsMarianne Lennie Dunnigan, PA-C Palliative Medicine Pager: [phone]  Total time 15 min.

## 2017-11-16 NOTE — Progress Notes (Signed)
Preliminary notes by tech--Left upper extremity venous duplex study completed. Negative for deep veins thrombosis. Positive for segmental Cephalic vein non-compressible which [facility]. Edema noted.  Result notified RN Brien MatesEvan Cheek by phone.   Hongying Kline Bulthuis (RDMS RVT) 11/16/17 11:54 AM

## 2017-11-16 NOTE — Progress Notes (Signed)
Physical Therapy Treatment Patient Details Name: Megan [name] Depuy MRN: [id number] DOB: [birth year] Today's Date: 11/16/2017    History of Present Illness Pt is a 84 y/o female admitted secondary to sepsis likely fron aspiration PNA. PMH including but not limited to dementia, CVA with R sided weakness, DM and HTN. Of note, pt has been admitted to this hopsital five times in the past six months.    PT Comments    Pt non verbal throughout session. She only followed 1 command to kick LLE but unable to follow any additional commands.  Pt required Total A +2 for bed mobilities and max A +2 for transfer. Pt initiating movement to transfer but unable to fully clean hips off EOB. Will continue to follow to maximize pt's functional independence and safety with mobility.    Follow Up Recommendations  SNF;Supervision/Assistance - 24 hour;Other (comment)(if family refuses, will need HHPT)     Equipment Recommendations  Other (comment)(HOYER LIFT)    Recommendations for Other Services       Precautions / Restrictions Precautions Precautions: Fall Restrictions Weight Bearing Restrictions: No    Mobility  Bed Mobility Overal bed mobility: Needs Assistance Bed Mobility: Supine to Sit     Supine to sit: Total assist;+2 for physical assistance Sit to supine: Total assist;+2 for physical assistance   General bed mobility comments: Pt required total A +2 to come to EOB. Use of bed pad to position pt.   Transfers Overall transfer level: Needs assistance     Sit to Stand: Max assist         General transfer comment: pt initiating some movement into standing but unable to fully clear hips off bed even with max A +2. Attempted sit<>stand with stedy 2x with multimodle cues for hand plaement and technique.   Ambulation/Gait             General Gait Details: unable   Stairs            Wheelchair Mobility    Modified Rankin (Stroke Patients Only)       Balance Overall balance  assessment: Needs assistance Sitting-balance support: Feet supported Sitting balance-Leahy Scale: Poor Sitting balance - Comments: forward lean, close min guard for safety, min A at times   Standing balance support: During functional activity Standing balance-Leahy Scale: Zero Standing balance comment: Total assist +2 for transfer.                            Cognition Arousal/Alertness: Awake/alert Behavior During Therapy: Flat affect Overall Cognitive Status: History of cognitive impairments - at baseline                                 General Comments: Pt unable to follow commands on UEs, however was able to kick LLE when verball and visually cued.      Exercises      General Comments General comments (skin integrity, edema, etc.): pt daughter present for session      Pertinent Vitals/Pain Pain Assessment: Faces Faces Pain Scale: No hurt    Home Living                      Prior Function            PT Goals (current goals can now be found in the care plan section) Acute Rehab PT Goals Patient Stated Goal:  to go home per daughter  PT Goal Formulation: With family Time For Goal Achievement: 11/26/17 Potential to Achieve Goals: Fair Progress towards PT goals: Not progressing toward goals - comment    Frequency    Min 3X/week      PT Plan Current plan remains appropriate    Co-evaluation              AM-PAC PT "6 Clicks" Daily Activity  Outcome Measure  Difficulty turning over in bed (including adjusting bedclothes, sheets and blankets)?: Unable Difficulty moving from lying on back to sitting on the side of the bed? : Unable Difficulty sitting down on and standing up from a chair with arms (e.g., wheelchair, bedside commode, etc,.)?: Unable Help needed moving to and from a bed to chair (including a wheelchair)?: Total Help needed walking in hospital room?: Total Help needed climbing 3-5 steps with a railing? :  Total 6 Click Score: 6    End of Session Equipment Utilized During Treatment: Gait belt Activity Tolerance: Patient limited by fatigue Patient left: with call bell/phone within reach;with family/visitor present;in bed Nurse Communication: Mobility status;Need for lift equipment PT Visit Diagnosis: Other abnormalities of gait and mobility (R26.89)     Time: 1329-1350 PT Time Calculation (min) (ACUTE ONLY): 21 min  Charges:  $Therapeutic Activity: 8-22 mins                    G Codes:       Kallie Locks, [place] Pager [phone] Acute Rehab   Sheral Apley 11/16/2017, 2:55 PM

## 2017-11-16 NOTE — Progress Notes (Signed)
Pharmacy Antibiotic Note  Megan [name] is a 84 y.o. female admitted on 11/11/2017 with sepsis likely from aspiration pna.  Suspect pt continues to aspirate despite diet adjustments and meal time assistance.  Pt continues on Unasyn day#5 of therapy.   Plan: Continue Unasyn to 1.5g IV q8h Follow c/s, clinical progression, de-escalation/LOT, renal function  Weight: 166 lb 14.2 oz (75.7 kg)  Temp (24hrs), Avg:98.6 F (37 C), Min:98.5 F (36.9 C), Max:98.7 F (37.1 C)  Recent Labs  Lab 11/11/17 0609 11/11/17 0810 11/11/17 1153 11/11/17 1633 11/12/17 0512 11/12/17 0857 11/13/17 0641 11/14/17 0821 11/15/17 1109  WBC  --   --   --  13.3* 10.1  --  7.7 8.6 8.8  CREATININE  --   --   --  0.85  --  1.18* 0.63 0.63 0.60  LATICACIDVEN 2.45* 1.6 2.9*  --   --  1.1  --   --   --     Estimated Creatinine Clearance: 41.8 mL/min (by C-G formula based on SCr of 0.6 mg/dL).    Allergies  Allergen Reactions  . Lipitor [Atorvastatin] Other (See Comments)    Myalgias     Antimicrobials this admission: Cefepime 3/22 >> 3/23 Vancomycin 3/22 >> 3/23 Unasyn 3/23>>  Dose adjustments this admission: 3/24- Unasyn changed from q12h to q8h with renal function improvement  Microbiology results: 3/22 Bcx: neg 3/22 Cdiff: antigen and toxin neg 3/22 urine: 30K enterococcus faecalis- if patient does not have symptoms, would not recommend treating   Thank you for allowing pharmacy to be a part of this patient's care.  Toys 'R' UsKimberly Charie Pinkus, Pharm.D., BCPS Clinical Pharmacist Pager: [phone] Clinical phone for 11/16/2017 from 8:30-4:00 is [phone]. After 4pm, please call Main Rx [phone]) for assistance. 11/16/2017 1:47 PM

## 2017-11-16 NOTE — Plan of Care (Signed)
Patient is making progress toward discharge goal. No acute changes noted

## 2017-11-16 NOTE — Progress Notes (Signed)
PROGRESS NOTE    Megan [name]  ONG:[id number] DOB: 05-17-1922 DOA: 11/11/2017 PCP: [name], MD    Brief Narrative: [name] with history of hyperlipidemia, diabetes mellitus type 2, CVA with left-sided weakness, seizure disorder, dementia, diabetes ulcers, pneumonia presented on 11/11/2017 with worsening mental status, nausea, vomiting, diarrhea and possible aspiration and was admitted secondary to sepsis secondary to aspiration pneumonia.  Assessment & Plan:   Principal Problem:   Sepsis (HCC) Active Problems:   TIA (transient ischemic attack)   Right upper lobe pneumonia (HCC)   Diabetes mellitus type II, uncontrolled (HCC)   HTN (hypertension)   History of partial seizures   History of CVA w/residual right side deficits   Protein-calorie malnutrition, severe (HCC)   Anemia   Dementia with Lewy bodies  1-Aspiration Pneumonia.  Check chest x ray. Showed BL pulmonary edema, vs pulmonitis.  Started  Pulmicort.  IV fluids stopped.  Continue with IV antibiotic. unasyn  Daily weight, Strict I and o.  Hold on starting lasix due to poor oral intake and low BP.  Received 4 days on azithromycin, that should have cover for atypical.   Sepsis; related to aspiration PNA; vitals stable.   Diarrhea; c diff negative.  Received imodium. Not helping , will try lomotil PRN   History of CVA;  On plavix and statins.   DM; SSI.  Seizure, change Depacon to liquid form.   Dementia; on nemanda.   Left arm swelling; Doppler positive for superficial thrombosis cephalic vein; alternate cold warm compress. Increase aspirin to 325 mg. Ibuprofen PRN.  Goals of care. Will have palliative speaking with family again. Patient at high risk for aspiration. PNA on chest x ray not better, worse on right side. Patient will benefit from hospice care at home.    DVT prophylaxis: Heparin.  Code Status: DNR Family Communication: daughter at bedside.  Disposition Plan; home  when stable.    Consultants:   palliative   Procedures: none   Antimicrobials:unasyn    Subjective: Still having diarrhea at night. Will try lomotil at request of daughter.  Patient is alert, make noise at times.    Objective: Vitals:   11/16/17 0421 11/16/17 0946 11/16/17 1359 11/16/17 1443  BP: (!) 138/53   (!) 130/42  Pulse: 81     Resp: 17   20  Temp: 98.6 F (37 C)   98.3 F (36.8 C)  TempSrc: Oral   Axillary  SpO2: 97% 96% 94% 98%  Weight: 75.7 kg (166 lb 14.2 oz)       Intake/Output Summary (Last 24 hours) at 11/16/2017 1445 Last data filed at 11/16/2017 1444 Gross per 24 hour  Intake 520 ml  Output 800 ml  Net -280 ml   Filed Weights   11/12/17 0524 11/15/17 1854 11/16/17 0421  Weight: 68.6 kg (151 lb 3.8 oz) 77 kg (169 lb 12.1 oz) 75.7 kg (166 lb 14.2 oz)    Examination:  General exam: NAD Respiratory system: Respiratory effort normal, crackles bases.  Cardiovascular system: S 1 Gastrointestinal system: Abdomen is nondistended, soft and nontender. No organomegaly or masses felt. Normal bowel sounds heard. Central nervous system:sleepy, open eyes to voice.  Extremities: Symmetric 5 x 5 power. Skin: No rashes, lesions or ulcers    Data Reviewed: I have personally reviewed following labs and imaging studies  CBC: Recent Labs  Lab 11/11/17 0600 11/11/17 1633 11/12/17 0512 11/13/17 0641 11/14/17 0821 11/15/17 1109  WBC 13.4* 13.3* 10.1 7.7 8.6 8.8  NEUTROABS 9.3*  --   --   --   --   --  HGB 11.9* 9.9* 9.1* 8.8* 8.8* 9.5*  HCT 38.2 32.4* 29.3* 28.2* 28.9* 29.9*  MCV 91.8 92.3 92.1 92.2 90.6 91.4  PLT 468* 413* 367 355 365 345   Basic Metabolic Panel: Recent Labs  Lab 11/11/17 0600 11/11/17 1633 11/12/17 0857 11/13/17 0641 11/14/17 0821 11/15/17 1109  NA 141  --  137 141 139 141  K 5.1  --  3.5 3.8 3.6 3.9  CL 108  --  102 113* 109 111  CO2 22  --  GLUCOSE 242*  --  147* 148* 194* 193*  BUN 35*  --  CREATININE 0.92 0.85 1.18* 0.63 0.63 0.60  CALCIUM 9.8 8.6* 9.3 8.5* 8.6* 8.8*  MG  --  1.9  --   --   --   --   PHOS  --  2.4*  --   --   --   --    GFR: Estimated Creatinine Clearance: 41.8 mL/min (by C-G formula based on SCr of 0.6 mg/dL). Liver Function Tests: Recent Labs  Lab 11/11/17 0600  AST 19  ALT 10*  ALKPHOS 113  BILITOT 0.1*  PROT 7.7  ALBUMIN 2.4*   No results for input(s): LIPASE, AMYLASE in the last 168 hours. No results for input(s): AMMONIA in the last 168 hours. Coagulation Profile: Recent Labs  Lab 11/11/17 0600 11/12/17 0512  INR 1.25 1.39   Cardiac Enzymes: No results for input(s): CKTOTAL, CKMB, CKMBINDEX, TROPONINI in the last 168 hours. BNP (last 3 results) No results for input(s): PROBNP in the last 8760 hours. HbA1C: No results for input(s): HGBA1C in the last 72 hours. CBG: Recent Labs  Lab 11/15/17 1211 11/15/17 1607 11/15/17 2103 11/16/17 0802 11/16/17 1216  GLUCAP 188* 204* 184* 182* 179*   Lipid Profile: No results for input(s): CHOL, HDL, LDLCALC, TRIG, CHOLHDL, LDLDIRECT in the last 72 hours. Thyroid Function Tests: No results for input(s): TSH, T4TOTAL, FREET4, T3FREE, THYROIDAB in the last 72 hours. Anemia Panel: No results for input(s): VITAMINB12, FOLATE, FERRITIN, TIBC, IRON, RETICCTPCT in the last 72 hours. Sepsis Labs: Recent Labs  Lab 11/11/17 0609 11/11/17 0729 11/11/17 0810 11/11/17 1153 11/12/17 0857  PROCALCITON  --  <0.10  --   --   --   LATICACIDVEN 2.45*  --  1.6 2.9* 1.1    Recent Results (from the past 240 hour(s))  Blood Culture (routine x 2)     Status: None   Collection Time: 11/11/17  8:03 AM  Result Value Ref Range Status   Specimen Description BLOOD LEFT ANTECUBITAL  Final   Special Requests IN PEDIATRIC BOTTLE Blood Culture adequate volume  Final   Culture   Final    NO GROWTH 5 DAYS Performed at [facility], [address] [place], [place] [address]    Report Status 11/16/2017  FINAL  Final  Blood Culture (routine x 2)     Status: None   Collection Time: 11/11/17 11:53 AM  Result Value Ref Range Status   Specimen Description BLOOD BLOOD LEFT HAND  Final   Special Requests IN PEDIATRIC BOTTLE Blood Culture adequate volume  Final   Culture   Final    NO GROWTH 5 DAYS Performed at [facility], [address], [place], [place] [address]    Report Status 11/16/2017 FINAL  Final  C difficile quick scan w PCR reflex     Status: None   Collection Time: 11/11/17  3:55 PM  Result Value Ref Range Status   C Diff antigen NEGATIVE NEGATIVE Final   C Diff toxin NEGATIVE NEGATIVE Final   C Diff interpretation No C. difficile detected.  Final    Comment: Performed at [facility], [address] [place], [place] [address]  Urine culture     Status: Abnormal   Collection Time: 11/11/17 10:06 PM  Result Value Ref Range Status   Specimen Description URINE, RANDOM  Final   Special Requests   Final    NONE Performed at [facility], [address], [place], [place] [address]    Culture 30,000 COLONIES/mL ENTEROCOCCUS FAECALIS (A)  Final   Report Status 11/14/2017 FINAL  Final   Organism ID, Bacteria ENTEROCOCCUS FAECALIS (A)  Final      Susceptibility   Enterococcus faecalis - MIC*    AMPICILLIN <=2 SENSITIVE Sensitive     LEVOFLOXACIN 1 SENSITIVE Sensitive     NITROFURANTOIN <=16 SENSITIVE Sensitive     VANCOMYCIN 2 SENSITIVE Sensitive     * 30,000 COLONIES/mL ENTEROCOCCUS FAECALIS         Radiology Studies: Dg Chest 2 View  Result Date: 11/15/2017 CLINICAL DATA:  Cough, previous episodes of pneumonia, TIA-CVA. EXAM: CHEST - 2 VIEW COMPARISON:  Portable chest x-ray of November 11, 2017. FINDINGS: The lungs are adequately inflated. The interstitial markings remain increased. Hazy increased density at both lung bases is present and not greatly changed. The cardiac silhouette is top-normal in size. The pulmonary vascularity is not clearly engorged.  There is calcification in the wall of the aortic arch. There is multilevel degenerative disc disease of the thoracic spine. IMPRESSION: Bibasilar atelectasis or pneumonia. Increased interstitial prominence bilaterally may reflect interstitial edema or pneumonitis type pattern. Electronically Signed   By: David  [place] M.D.   On: 11/15/2017 13:24        Scheduled Meds: . aspirin  325 mg Oral Daily  . budesonide (PULMICORT) nebulizer solution  0.25 mg Nebulization BID  . clopidogrel  75 mg Oral Q breakfast  . famotidine  20 mg Oral Daily  . feeding supplement (PRO-STAT SUGAR FREE 64)  30 mL Oral TID WC & HS  . ferrous sulfate  325 mg Oral Q breakfast  . guaiFENesin  10 mL Oral QID  . heparin  5,000 Units Subcutaneous Q8H  . insulin aspart  0-5 Units Subcutaneous QHS  . insulin aspart  0-9 Units Subcutaneous TID WC  . lactobacillus acidophilus  2 tablet Oral TID  . levalbuterol  0.63 mg Nebulization TID  . memantine  14 mg Oral QHS  . multivitamin with minerals  1 tablet Oral Daily  . nystatin   Topical BID  . protein supplement shake  2 oz Oral TID WC & HS  . rosuvastatin  5 mg Oral Q M,W,F  . sodium chloride  1 g Oral Daily  . Valproate Sodium  250 mg Oral BID  . vitamin C  500 mg Oral Daily  . zinc oxide  1 application Topical Daily   Continuous Infusions: . ampicillin-sulbactam (UNASYN) IV Stopped (11/16/17 1433)     LOS: 5 days    Time spent: 35 minutes.     Alba Cory, MD [facility] Pager [phone]  If 7PM-7AM, please contact night-coverage [email or web address] Password [facility] 11/16/2017, 2:45 PM

## 2017-11-17 DIAGNOSIS — J690 Pneumonitis due to inhalation of food and vomit: Secondary | ICD-10-CM

## 2017-11-17 LAB — GLUCOSE, CAPILLARY
Glucose-Capillary: 199 mg/dL — ABNORMAL HIGH (ref 65–99)
Glucose-Capillary: 212 mg/dL — ABNORMAL HIGH (ref 65–99)

## 2017-11-17 MED ORDER — ASPIRIN EC 81 MG PO TBEC: 81.0000 mg | DELAYED_RELEASE_TABLET | Freq: Every day | ORAL | 2 refills | Status: AC

## 2017-11-17 MED ORDER — INSULIN ASPART 100 UNIT/ML ~~LOC~~ SOLN: 2.0000 [IU] | Freq: Three times a day (TID) | SUBCUTANEOUS | 3 refills | Status: DC

## 2017-11-17 MED ORDER — CEFDINIR 250 MG/5ML PO SUSR: 300.0000 mg | Freq: Two times a day (BID) | ORAL | 0 refills | Status: AC

## 2017-11-17 MED ORDER — METRONIDAZOLE 50 MG/ML ORAL SUSPENSION: 500.0000 mg | Freq: Three times a day (TID) | ORAL | 0 refills | Status: DC

## 2017-11-17 MED ORDER — LOPERAMIDE HCL 2 MG PO TABS: 2.0000 mg | ORAL_TABLET | Freq: Four times a day (QID) | ORAL | 0 refills | Status: AC | PRN

## 2017-11-17 MED ORDER — LEVALBUTEROL HCL 0.63 MG/3ML IN NEBU: 0.6300 mg | INHALATION_SOLUTION | Freq: Three times a day (TID) | RESPIRATORY_TRACT | 12 refills | Status: DC

## 2017-11-17 MED ORDER — MELATONIN 3 MG PO TABS: 3.0000 mg | ORAL_TABLET | Freq: Every evening | ORAL | 0 refills | Status: AC | PRN

## 2017-11-17 MED ORDER — BUDESONIDE 0.25 MG/2ML IN SUSP: 0.2500 mg | Freq: Two times a day (BID) | RESPIRATORY_TRACT | 12 refills | Status: DC

## 2017-11-17 MED ORDER — DIPHENOXYLATE-ATROPINE 2.5-0.025 MG/5ML PO LIQD: 5.0000 mL | Freq: Four times a day (QID) | ORAL | 0 refills | Status: DC | PRN

## 2017-11-17 MED ORDER — VALPROATE SODIUM 250 MG/5ML PO SOLN: 250.0000 mg | Freq: Two times a day (BID) | ORAL | 1 refills | Status: DC

## 2017-11-17 MED ORDER — IBUPROFEN 100 MG/5ML PO SUSP: 400.0000 mg | Freq: Four times a day (QID) | ORAL | 0 refills | Status: DC | PRN

## 2017-11-17 MED ORDER — BACID PO TABS: 2.0000 | ORAL_TABLET | Freq: Three times a day (TID) | ORAL | 0 refills | Status: AC

## 2017-11-17 MED ORDER — ONDANSETRON HCL 4 MG PO TABS: 4.0000 mg | ORAL_TABLET | Freq: Four times a day (QID) | ORAL | 0 refills | Status: DC | PRN

## 2017-11-17 MED ORDER — CEFDINIR 125 MG/5ML PO SUSR
300.0000 mg | Freq: Two times a day (BID) | ORAL | Status: DC
  Administered 2017-11-17: 300 mg via ORAL
  Filled 2017-11-17: qty 6
  Filled 2017-11-17: qty 15

## 2017-11-17 MED ORDER — GUAIFENESIN 100 MG/5ML PO SOLN: 10.0000 mL | Freq: Four times a day (QID) | ORAL | 0 refills | Status: DC

## 2017-11-17 MED ORDER — METRONIDAZOLE 50 MG/ML ORAL SUSPENSION
500.0000 mg | Freq: Three times a day (TID) | ORAL | Status: DC
  Administered 2017-11-17 (×2): 500 mg via ORAL
  Filled 2017-11-17 (×3): qty 10

## 2017-11-17 MED ORDER — ADULT MULTIVITAMIN W/MINERALS CH: 1.0000 | ORAL_TABLET | Freq: Every day | ORAL | 0 refills | Status: AC

## 2017-11-17 NOTE — Care Management Note (Addendum)
Case Management Note  Patient Details  Name: Omar PersonRuby Stembridge MRN: [id number] Date of Birth: 08-16-1922  Subjective/Objective:         Sepsis/ Aspiration PNA. Hx of hyperlipidemia, diabetes mellitus type 2, CVA with left-sided weakness, seizure disorder, dementia, diabetes ulcers, pneumonia. Resides with daughter Dennie Bibleat.   Chanda BusingGail Patricia Karrer (Daughter) Roney Marionhelma Williams (Daughter)    [phone]      MVH:QIONPCP:John Valentina LucksGriffin   11/17/2016  @ 1535 NCM called and arranged transportation to home with [facility] for pt pick up. NCM made daughter Dennie Bibleat and nurse aware.  Action/Plan: Transition to home with the resumption of home health services. Pt will need transportation to home arranged with [facility].  Expected Discharge Date:  11/17/17               Expected Discharge Plan:  Home w Home Health Services  In-House Referral:  NA  Discharge planning Services  CM Consult  Post Acute Care Choice:  Home Health Choice offered to:  Adult Children  DME Arranged:    DME Agency:     HH Arranged:  Disease Management, RN, Speech Therapy, OT, PT, Nurse's Aide HH Agency:  [facility]  Status of Service:  Completed, signed off  If discussed at Long Length of Stay Meetings, dates discussed:    Additional Comments:  Epifanio LeschesCole, Sharnika Binney Hudson, RN 11/17/2017, 11:50 AM

## 2017-11-17 NOTE — Discharge Summary (Signed)
Physician Discharge Summary  Dhara Schepp ZOX:[id number] DOB: Apr 27, 1922 DOA: 11/11/2017  PCP: [name], MD  Admit date: 11/11/2017 Discharge date: 11/17/2017  Admitted From: Home  Disposition: Home   Recommendations for Outpatient Follow-up:  1. Follow up with PCP in 1-2 weeks 2. Please obtain BMP/CBC in one week 3. Patient will be discharge home with [facility], Daughter wants to try speech therapy at home  for couple of weeks. She will consider hospice after few weeks.    Home Health: yes, speech therapist. RN  Discharge Condition: Stable.  CODE STATUS: DNR Diet recommendation: Dysphagia 1.   Brief/Interim Summary:  Brief Narrative: [name] with history of hyperlipidemia, diabetes mellitus type 2, CVA with left-sided weakness, seizure disorder, dementia, diabetes ulcers, pneumonia presented on 11/11/2017 with worsening mental status, nausea, vomiting, diarrhea and possible aspiration and was admitted secondary to sepsis secondary to aspiration pneumonia.  Assessment & Plan:   Principal Problem:   Sepsis (HCC) Active Problems:   TIA (transient ischemic attack)   Right upper lobe pneumonia (HCC)   Diabetes mellitus type II, uncontrolled (HCC)   HTN (hypertension)   History of partial seizures   History of CVA w/residual right side deficits   Protein-calorie malnutrition, severe (HCC)   Anemia   Dementia with Lewy bodies  1-Aspiration Pneumonia.  Check chest x ray. Showed BL pulmonary edema, vs pulmonitis.  Started  Pulmicort.  IV fluids stopped.  Treated with IV Unasyn 6 days. Discharge on 2 more days of antibiotic omnicef and flagyl.  Hold on starting lasix due to poor oral intake and low BP.  Received 4 days on azithromycin, that should have cover for atypical.  Patient is stable today to be discharge, no hypoxemia, afebrile, recent WBC normal, cough improved, also has very supportive family. Will provide nebulizer treatments at discharge also.    Sepsis; related to aspiration PNA; vitals stable. Resolved.   Diarrhea; c diff negative.  Only had one BM early this am at 4 AM, more loose. Suspect diarrhea will improved after stopping antibiotics. Will provide prescription for lomotil PRN and imodium, not to give together.   History of CVA;  On plavix and statins.   DM; SSI. Discharge on short acting, novolog, to give if patient eats more than 50% and if CBG more than 160, to avoid hypoglycemia.   Seizure, change Depacon to liquid form. Will provide prescription.   Dementia; Continue with  nemanda.   Left arm swelling; Doppler positive for superficial thrombosis cephalic vein; alternate cold warm compress.  Ibuprofen PRN. Aspirin 81 mg daily, patient is also on Plavix.   Goals of care. patient stable for discharge today, no hypoxemia, afebrile, Respiratory status stable. Patient is at high risk for recurrent aspiration PNA> Daughter wants to try Speech Therapy for couple of weeks at home, if that doesn't help she will contact Hospice service.     Discharge Diagnoses:  Principal Problem:   Sepsis (HCC) Active Problems:   TIA (transient ischemic attack)   Right upper lobe pneumonia (HCC)   Diabetes mellitus type II, uncontrolled (HCC)   HTN (hypertension)   History of partial seizures   History of CVA w/residual right side deficits   Protein-calorie malnutrition, severe (HCC)   Anemia   Dementia with Lewy bodies   Encounter for hospice care discussion   Palliative care encounter    Discharge Instructions  Discharge Instructions    Increase activity slowly   Complete by:  As directed      Allergies as  of 11/17/2017      Reactions   Lipitor [atorvastatin] Other (See Comments)   Myalgias       Medication List    STOP taking these medications   amoxicillin-clavulanate 500-125 MG tablet Commonly known as:  AUGMENTIN   bisacodyl 10 MG suppository Commonly known as:  DULCOLAX   divalproex 500 MG 24 hr  tablet Commonly known as:  DEPAKOTE ER   guaiFENesin 600 MG 12 hr tablet Commonly known as:  MUCINEX Replaced by:  guaiFENesin 100 MG/5ML Soln   lactulose 10 GM/15ML solution Commonly known as:  CHRONULAC   polyethylene glycol packet Commonly known as:  MIRALAX   sodium chloride 1 g tablet   TOUJEO SOLOSTAR 300 UNIT/ML Sopn Generic drug:  Insulin Glargine     TAKE these medications   acetaminophen 325 MG tablet Commonly known as:  TYLENOL Take 2 tablets (650 mg total) by mouth every 6 (six) hours as needed for mild pain (or Fever >/= 101).   aspirin EC 81 MG tablet Take 1 tablet (81 mg total) by mouth daily.   budesonide 0.25 MG/2ML nebulizer solution Commonly known as:  PULMICORT Take 2 mLs (0.25 mg total) by nebulization 2 (two) times daily.   cefdinir 250 MG/5ML suspension Commonly known as:  OMNICEF Take 6 mLs (300 mg total) by mouth 2 (two) times daily for 2 days.   clopidogrel 75 MG tablet Commonly known as:  PLAVIX Take 1 tablet (75 mg total) by mouth daily with breakfast.   diphenoxylate-atropine 2.5-0.025 MG/5ML liquid Commonly known as:  LOMOTIL Take 5 mLs by mouth 4 (four) times daily as needed for diarrhea or loose stools.   famotidine 20 MG tablet Commonly known as:  PEPCID Take 1 tablet (20 mg total) by mouth daily.   feeding supplement (PRO-STAT SUGAR FREE 64) Liqd Take 30 mLs by mouth 4 (four) times daily -  with meals and at bedtime.   ferrous sulfate 325 (65 FE) MG tablet Take 325 mg by mouth daily with breakfast.   guaiFENesin 100 MG/5ML Soln Commonly known as:  ROBITUSSIN Take 10 mLs (200 mg total) by mouth 4 (four) times daily. Replaces:  guaiFENesin 600 MG 12 hr tablet   ibuprofen 100 MG/5ML suspension Commonly known as:  ADVIL,MOTRIN Take 20 mLs (400 mg total) by mouth every 6 (six) hours as needed (prn lef arm pain).   insulin aspart 100 UNIT/ML injection Commonly known as:  NOVOLOG Inject 2 Units into the skin 3 (three) times  daily with meals. Please inject 2 units if patient eats more than 50 % meals and if Blood sugar above 160   lactobacillus acidophilus Tabs tablet Take 2 tablets by mouth 3 (three) times daily.   levalbuterol 0.63 MG/3ML nebulizer solution Commonly known as:  XOPENEX Take 3 mLs (0.63 mg total) by nebulization 3 (three) times daily.   loperamide 2 MG tablet Commonly known as:  IMODIUM A-D Take 1 tablet (2 mg total) by mouth 4 (four) times daily as needed for diarrhea or loose stools.   Melatonin 3 MG Tabs Take 1 tablet (3 mg total) by mouth at bedtime as needed (sleep). What changed:    medication strength  how much to take   metroNIDAZOLE 50 mg/ml oral suspension Commonly known as:  FLAGYL Take 10 mLs (500 mg total) by mouth 3 (three) times daily.   multivitamin with minerals Tabs tablet Take 1 tablet by mouth daily. Start taking on:  11/18/2017   NAMENDA XR 14 MG Cp24 24  hr capsule Generic drug:  memantine Take 14 mg by mouth at bedtime.   nystatin powder Generic drug:  nystatin Apply 1 application topically 2 (two) times daily as needed for rash.   ondansetron 4 MG tablet Commonly known as:  ZOFRAN Take 1 tablet (4 mg total) by mouth every 6 (six) hours as needed for nausea.   protein supplement shake Liqd Commonly known as:  PREMIER PROTEIN Take 59.1 mLs (2 oz total) by mouth 4 (four) times daily -  with meals and at bedtime.   rosuvastatin 5 MG tablet Commonly known as:  CRESTOR Take 5 mg by mouth every Monday, Wednesday, and Friday. At bedtime   Valproate Sodium 250 MG/5ML Soln solution Commonly known as:  DEPAKENE Take 5 mLs (250 mg total) by mouth 2 (two) times daily.   vitamin C 500 MG tablet Commonly known as:  ASCORBIC ACID Take 500 mg by mouth daily.   zinc oxide 11.3 % Crea cream Commonly known as:  BALMEX Apply 1 application topically daily as needed (for rash).            Durable Medical Equipment  (From admission, onward)         Start     Ordered   11/17/17 1021  For home use only DME Nebulizer machine  Once    Question:  Patient needs a nebulizer to treat with the following condition  Answer:  Pneumonitis   11/17/17 1020     Follow-up Information    [name], MD Follow up in 2 week(s).   Specialty:  Internal Medicine Contact information: [address] [place] [address] [phone]          Allergies  Allergen Reactions  . Lipitor [Atorvastatin] Other (See Comments)    Myalgias     Consultations:  Palliative   Procedures/Studies: Dg Chest 1 View  Result Date: 10/31/2017 CLINICAL DATA:  84 year old female with altered mental status. EXAM: CHEST 1 VIEW COMPARISON:  Chest radiograph dated 10/26/2017 FINDINGS: There is shallow inspiration with bibasilar atelectasis/scarring. An area of increased density in the right upper lung field may be chronic or represent developing infiltrate. Clinical correlation is recommended. There is no large pleural effusion. No pneumothorax. Stable cardiac silhouette. Atherosclerotic calcification of the aortic arch. Osteopenia with degenerative changes of the spine. No acute osseous pathology. IMPRESSION: Chronic changes and interstitial coarsening versus developing infiltrate in the right upper lung field. Clinical correlation is recommended. Electronically Signed   By: Elgie Collard M.D.   On: 10/31/2017 23:19   Dg Chest 2 View  Result Date: 11/15/2017 CLINICAL DATA:  Cough, previous episodes of pneumonia, TIA-CVA. EXAM: CHEST - 2 VIEW COMPARISON:  Portable chest x-ray of November 11, 2017. FINDINGS: The lungs are adequately inflated. The interstitial markings remain increased. Hazy increased density at both lung bases is present and not greatly changed. The cardiac silhouette is top-normal in size. The pulmonary vascularity is not clearly engorged. There is calcification in the wall of the aortic arch. There is multilevel degenerative disc disease  of the thoracic spine. IMPRESSION: Bibasilar atelectasis or pneumonia. Increased interstitial prominence bilaterally may reflect interstitial edema or pneumonitis type pattern. Electronically Signed   By: David  Swaz[place]and M.D.   On: 11/15/2017 13:24   Dg Chest 2 View  Result Date: 10/26/2017 CLINICAL DATA:  Altered mental status, hypotension, fatigue, diabetes mellitus, history of stroke and seizures EXAM: CHEST - 2 VIEW COMPARISON:  06/18/2017 FINDINGS: Minimal enlargement of cardiac silhouette. Atherosclerotic calcification  aorta. RIGHT upper lobe airspace opacity consistent with pneumonia. BILATERAL lower lobe opacities which may represent atelectasis or known fibrosis though mild superimposed acute infiltrates are not completely excluded. Upper LEFT lung clear. No pleural effusion or pneumothorax. No acute osseous findings. IMPRESSION: RIGHT upper lobe pneumonia. Chronic bibasilar opacities favoring fibrosis and atelectasis. Electronically Signed   By: Ulyses Southward M.D.   On: 10/26/2017 15:54   Dg Abd 1 View  Result Date: 11/01/2017 CLINICAL DATA:  84 y/o F; lethargic and not verbal. Presenting with altered mental status. Immediate 10/26/2017 for pneumonia. History of CVA with right-sided weakness. EXAM: ABDOMEN - 1 VIEW COMPARISON:  10/31/2017 chest radiograph. FINDINGS: Normal bowel gas pattern. Moderate volume of stool throughout the colon. Vascular calcifications. Bones are demineralized and there are multilevel degenerative changes of the spine. Coarse interstitial opacities in the lung bases better characterized on recent chest radiograph. IMPRESSION: Normal bowel gas pattern. Moderate volume of stool throughout the colon. Electronically Signed   By: Mitzi Hansen M.D.   On: 11/01/2017 02:26   Ct Head Wo Contrast  Result Date: 10/31/2017 CLINICAL DATA:  84 y/o  F; evaluation of altered mental status. EXAM: CT HEAD WITHOUT CONTRAST TECHNIQUE: Contiguous axial images were obtained from the  base of the skull through the vertex without intravenous contrast. COMPARISON:  10/26/2017 CT head FINDINGS: Brain: No evidence of acute infarction, hemorrhage, hydrocephalus, extra-axial collection or mass lesion/mass effect. Stable advanced chronic microvascular ischemic changes, diffuse parenchymal volume loss of the brain, and basal ganglia chronic lacunar infarctions. Vascular: Calcific atherosclerosis of carotid siphons and vertebral arteries. No hyperdense vessel identified. Skull: Normal. Negative for fracture or focal lesion. Sinuses/Orbits: Partial right mastoid effusion. Bilateral intra-ocular lens replacement. Otherwise negative. Other: None. IMPRESSION: 1. No acute intracranial abnormality identified. 2. Stable advanced chronic microvascular ischemic changes, parenchymal volume loss, and basal ganglia lacunar infarcts. Electronically Signed   By: Mitzi Hansen M.D.   On: 10/31/2017 23:05   Ct Head Wo Contrast  Result Date: 10/26/2017 CLINICAL DATA:  Altered mental status, newly nonverbal today. EXAM: CT HEAD WITHOUT CONTRAST TECHNIQUE: Contiguous axial images were obtained from the base of the skull through the vertex without intravenous contrast. COMPARISON:  06/18/2017. FINDINGS: Brain: No definite evidence of an acute infarct, acute hemorrhage, mass lesion, mass effect or hydrocephalus. Atrophy. Periventricular low attenuation. Probable remote infarcts in the basal ganglia bilaterally. Vascular: No hyperdense vessel or unexpected calcification. Skull: Normal. Negative for fracture or focal lesion. Sinuses/Orbits: No acute finding. Other: Right mastoid effusion. IMPRESSION: 1. No acute intracranial abnormality. 2. Atrophy and chronic microvascular white matter ischemic changes. 3. Remote basal ganglia lacunar infarcts. 4. Right mastoid effusion. Electronically Signed   By: Leanna Battles M.D.   On: 10/26/2017 13:16   Dg Chest Port 1 View  Result Date: 11/11/2017 CLINICAL DATA:   Nausea and vomiting.  Possible aspiration. EXAM: PORTABLE CHEST 1 VIEW COMPARISON:  Radiograph 11/03/2017 FINDINGS: Progressive left basilar from prior exam, minimal right basilar opacity which is similar to mildly progressed. Resolved right upper lobe airspace opacity from prior. Overall low lung volumes. Unchanged aortic tortuosity. Heart size not well assessed given positioning. No pleural fluid or pneumothorax. Chronic change about the right shoulder. IMPRESSION: Progressive left basilar opacity. Right basilar opacity on prior is unchanged or possibly progressed. Given distribution, findings suggest aspiration. Resolved right upper lobe pneumonia. Electronically Signed   By: Rubye Oaks M.D.   On: 11/11/2017 06:02   Dg Chest Port 1 View  Result Date: 11/03/2017 CLINICAL  DATA:  84 year old female with cough. EXAM: PORTABLE CHEST 1 VIEW COMPARISON:  10/31/2017 and earlier. FINDINGS: Portable AP semi upright view at 1531 hours. Stable low lung volumes. Stable cardiac size and mediastinal contours. Calcified aortic atherosclerosis. Visualized tracheal air column is within normal limits. Interstitial and indistinct right upper lung opacity has regressed since 10/26/2017. Lung parenchyma elsewhere appears stable. No pneumothorax. No pleural effusion is evident. No new pulmonary opacity. Negative visible bowel gas pattern. IMPRESSION: 1. Regressed right upper lobe pneumonia since 10/26/2017. Mild residual. 2. Stable bibasilar hypo-ventilation. 3. No new cardiopulmonary abnormality identified. Electronically Signed   By: Odessa Fleming M.D.   On: 11/03/2017 15:57   Dg Swallowing Func-speech Pathology  Result Date: 11/13/2017 Objective Swallowing Evaluation: Type of Study: MBS-Modified Barium Swallow Study  Patient Details Name: Fay Bagg MRN: [id number] Date of Birth: [birth year] Today's Date: 11/13/2017 Time: SLP Start Time (ACUTE ONLY): 1135 -SLP Stop Time (ACUTE ONLY): 1205 SLP Time Calculation (min) (ACUTE ONLY):  30 min Past Medical History: Past Medical History: Diagnosis Date . Diabetes mellitus  . Hyperlipidemia  . Seizure (HCC)  . Stroke [facility]) 2012  possible stroke Jan 2016 . UTI (urinary tract infection) 10/2017 Past Surgical History: Past Surgical History: Procedure Laterality Date . CATARACT EXTRACTION   HPI: [name] who is presenting from home for worsening mental status, nausea vomiting, diarrhea, possible aspiration by reported her daughter  No data recorded Assessment / Plan / Recommendation CHL IP CLINICAL IMPRESSIONS 11/13/2017 Clinical Impression Pt currently presenting with a severe oral phase, mild pharyngeal phase, cognitive-based dysphagia. Pt with oral holding of puree bolus greater than 30 seconds, along with minimal oral residuals post-swallow. With both nectar-thick and thin liquid consistencies, pt had inconsistent timing of swallow initiation, sometimes premature spill with bolus filling the vallecula and spilling over to pyriform sinuses prior to swallow. Despite this, pt had excellent airway protection and showed only one instance of flash penetration with nectar thick liquids but no other penetration or aspiration observed, no significant pharyngeal residuals, occasional oral residuals post-swallow. Pt was easily distracted during study, frequently looking down at IV and turning to look at video. While pt did not show any penetration or aspiration during this study, pt does continue to be at an increased risk due to cognitive status and oral phase deficits observed- decreased attention to bolus or oral residuals post-swallow. Daughter present for study- feels that pt coughs when drinking water but less coughing with thicker liquids such as protein shake. Recommend initiating dysphagia 1 diet, thin liquids, meds crushed in puree, or whole in puree if unable to crush, full supervision- ensure pt fully upright (in chair if possible), allow to self-feed to the extent she is  able, monitor oral holding, ensure pt has swallowed bolus before providing additional food, minimize distractions, oral care before/ after meals. If frequent s/s of aspiration at bedside with thin liquids, may consider downgrade to nectar-thick, although during MBS there was essentially no benefit, as the timing of swallow initiation was variable with both. SLP will continue to follow for diet tolerance/ education.  SLP Visit Diagnosis Dysphagia, oropharyngeal phase (R13.12) Attention and concentration deficit following -- Frontal lobe and executive function deficit following -- Impact on safety and function Moderate aspiration risk   CHL IP TREATMENT RECOMMENDATION 11/13/2017 Treatment Recommendations Therapy as outlined in treatment plan below   Prognosis 11/13/2017 Prognosis for Safe Diet Advancement Fair Barriers to Reach Goals Cognitive deficits Barriers/Prognosis Comment -- CHL IP DIET RECOMMENDATION 11/13/2017 SLP Diet Recommendations  Dysphagia 1 (Puree) solids;Thin liquid Liquid Administration via Straw Medication Administration Crushed with puree Compensations Minimize environmental distractions;Slow rate;Small sips/bites;Lingual sweep for clearance of pocketing Postural Changes Remain semi-upright after after feeds/meals (Comment);Seated upright at 90 degrees   CHL IP OTHER RECOMMENDATIONS 11/13/2017 Recommended Consults -- Oral Care Recommendations Oral care before and after PO Other Recommendations Clarify dietary restrictions   CHL IP FOLLOW UP RECOMMENDATIONS 11/13/2017 Follow up Recommendations 24 hour supervision/assistance   CHL IP FREQUENCY AND DURATION 11/13/2017 Speech Therapy Frequency (ACUTE ONLY) min 2x/week Treatment Duration 1 week      CHL IP ORAL PHASE 11/13/2017 Oral Phase Impaired Oral - Pudding Teaspoon -- Oral - Pudding Cup -- Oral - Honey Teaspoon -- Oral - Honey Cup -- Oral - Nectar Teaspoon -- Oral - Nectar Cup -- Oral - Nectar Straw Lingual/palatal residue;Delayed oral transit;Decreased  bolus cohesion;Premature spillage Oral - Thin Teaspoon -- Oral - Thin Cup -- Oral - Thin Straw Lingual/palatal residue;Delayed oral transit;Decreased bolus cohesion;Premature spillage Oral - Puree Holding of bolus;Lingual/palatal residue;Delayed oral transit Oral - Mech Soft -- Oral - Regular -- Oral - Multi-Consistency -- Oral - Pill -- Oral Phase - Comment --  CHL IP PHARYNGEAL PHASE 11/13/2017 Pharyngeal Phase Impaired Pharyngeal- Pudding Teaspoon -- Pharyngeal -- Pharyngeal- Pudding Cup -- Pharyngeal -- Pharyngeal- Honey Teaspoon -- Pharyngeal -- Pharyngeal- Honey Cup -- Pharyngeal -- Pharyngeal- Nectar Teaspoon -- Pharyngeal -- Pharyngeal- Nectar Cup -- Pharyngeal -- Pharyngeal- Nectar Straw Delayed swallow initiation-pyriform sinuses;Penetration/Aspiration before swallow Pharyngeal Material enters airway, remains ABOVE vocal cords then ejected out Pharyngeal- Thin Teaspoon -- Pharyngeal -- Pharyngeal- Thin Cup -- Pharyngeal -- Pharyngeal- Thin Straw Delayed swallow initiation-pyriform sinuses Pharyngeal -- Pharyngeal- Puree WFL Pharyngeal -- Pharyngeal- Mechanical Soft -- Pharyngeal -- Pharyngeal- Regular -- Pharyngeal -- Pharyngeal- Multi-consistency -- Pharyngeal -- Pharyngeal- Pill -- Pharyngeal -- Pharyngeal Comment --  No flowsheet data found. No flowsheet data found. Metro Kung, MA, CCC-SLP 11/13/2017, 12:47 PM [phone]                Subjective: Open eyes, make few noise. Non verbal. Per daughter patient cough less last night. Had one episode diarrhea, more loose.   Discharge Exam: Vitals:   11/17/17 0441 11/17/17 0919  BP: 135/60   Pulse: 91   Resp: 18   Temp: 98.9 F (37.2 C)   SpO2: 96% 92%   Vitals:   11/16/17 2117 11/16/17 2143 11/17/17 0441 11/17/17 0919  BP:  (!) 146/50 135/60   Pulse: 81 (!) 106 91   Resp: 18 18 18    Temp:  97.8 F (36.6 C) 98.9 F (37.2 C)   TempSrc:  Oral Oral   SpO2: 99% 96% 96% 92%  Weight:   67.6 kg (149 lb 0.5 oz)     General: Pt is alert,  awake, not in acute distress Cardiovascular: RRR, S1/S2 +, no rubs, no gallops Respiratory:  Normal Respiratory effort, few crackles.  Abdominal: Soft, NT, ND, bowel sounds + Extremities: no edema, no cyanosis    The results of significant diagnostics from this hospitalization (including imaging, microbiology, ancillary and laboratory) are listed below for reference.     Microbiology: Recent Results (from the past 240 hour(s))  Blood Culture (routine x 2)     Status: None   Collection Time: 11/11/17  8:03 AM  Result Value Ref Range Status   Specimen Description BLOOD LEFT ANTECUBITAL  Final   Special Requests IN PEDIATRIC BOTTLE Blood Culture adequate volume  Final   Culture  Final    NO GROWTH 5 DAYS Performed at [facility], [address] [place], [place] [address]    Report Status 11/16/2017 FINAL  Final  Blood Culture (routine x 2)     Status: None   Collection Time: 11/11/17 11:53 AM  Result Value Ref Range Status   Specimen Description BLOOD BLOOD LEFT HAND  Final   Special Requests IN PEDIATRIC BOTTLE Blood Culture adequate volume  Final   Culture   Final    NO GROWTH 5 DAYS Performed at [facility], [address] [place], [place] [address]    Report Status 11/16/2017 FINAL  Final  C difficile quick scan w PCR reflex     Status: None   Collection Time: 11/11/17  3:55 PM  Result Value Ref Range Status   C Diff antigen NEGATIVE NEGATIVE Final   C Diff toxin NEGATIVE NEGATIVE Final   C Diff interpretation No C. difficile detected.  Final    Comment: Performed at [facility], [address] [place], [place] [address]  Urine culture     Status: Abnormal   Collection Time: 11/11/17 10:06 PM  Result Value Ref Range Status   Specimen Description URINE, RANDOM  Final   Special Requests   Final    NONE Performed at [facility], [address] [place], [place] [address]    Culture 30,000 COLONIES/mL ENTEROCOCCUS FAECALIS (A)  Final    Report Status 11/14/2017 FINAL  Final   Organism ID, Bacteria ENTEROCOCCUS FAECALIS (A)  Final      Susceptibility   Enterococcus faecalis - MIC*    AMPICILLIN <=2 SENSITIVE Sensitive     LEVOFLOXACIN 1 SENSITIVE Sensitive     NITROFURANTOIN <=16 SENSITIVE Sensitive     VANCOMYCIN 2 SENSITIVE Sensitive     * 30,000 COLONIES/mL ENTEROCOCCUS FAECALIS     Labs: BNP (last 3 results) Recent Labs    11/11/17 1633  BNP 77.6   Basic Metabolic Panel: Recent Labs  Lab 11/11/17 0600 11/11/17 1633 11/12/17 0857 11/13/17 0641 11/14/17 0821 11/15/17 1109  NA 141  --  137 141 139 141  K 5.1  --  3.5 3.8 3.6 3.9  CL 108  --  102 113* 109 111  CO2 22  --  23 23 23 23   GLUCOSE 242*  --  147* 148* 194* 193*  BUN 35*  --  10 11 10 12   CREATININE 0.92 0.85 1.18* 0.63 0.63 0.60  CALCIUM 9.8 8.6* 9.3 8.5* 8.6* 8.8*  MG  --  1.9  --   --   --   --   PHOS  --  2.4*  --   --   --   --    Liver Function Tests: Recent Labs  Lab 11/11/17 0600  AST 19  ALT 10*  ALKPHOS 113  BILITOT 0.1*  PROT 7.7  ALBUMIN 2.4*   No results for input(s): LIPASE, AMYLASE in the last 168 hours. No results for input(s): AMMONIA in the last 168 hours. CBC: Recent Labs  Lab 11/11/17 0600 11/11/17 1633 11/12/17 0512 11/13/17 0641 11/14/17 0821 11/15/17 1109  WBC 13.4* 13.3* 10.1 7.7 8.6 8.8  NEUTROABS 9.3*  --   --   --   --   --   HGB 11.9* 9.9* 9.1* 8.8* 8.8* 9.5*  HCT 38.2 32.4* 29.3* 28.2* 28.9* 29.9*  MCV 91.8 92.3 92.1 92.2 90.6 91.4  PLT 468* 413* 367 355 365 345  Cardiac Enzymes: No results for input(s): CKTOTAL, CKMB, CKMBINDEX, TROPONINI in the last 168 hours. BNP: Invalid input(s): POCBNP CBG: Recent Labs  Lab 11/16/17 0802 11/16/17 1216 11/16/17 1611 11/16/17 2137 11/17/17 0802  GLUCAP 182* 179* 171* 200* 199*   D-Dimer No results for input(s): DDIMER in the last 72 hours. Hgb A1c No results for input(s): HGBA1C in the last 72 hours. Lipid Profile No results for input(s):  CHOL, HDL, LDLCALC, TRIG, CHOLHDL, LDLDIRECT in the last 72 hours. Thyroid function studies No results for input(s): TSH, T4TOTAL, T3FREE, THYROIDAB in the last 72 hours.  Invalid input(s): FREET3 Anemia work up No results for input(s): VITAMINB12, FOLATE, FERRITIN, TIBC, IRON, RETICCTPCT in the last 72 hours. Urinalysis    Component Value Date/Time   COLORURINE YELLOW 11/11/2017 2221   APPEARANCEUR HAZY (A) 11/11/2017 2221   LABSPEC 1.020 11/11/2017 2221   PHURINE 5.0 11/11/2017 2221   GLUCOSEU >=500 (A) 11/11/2017 2221   HGBUR NEGATIVE 11/11/2017 2221   BILIRUBINUR NEGATIVE 11/11/2017 2221   KETONESUR 5 (A) 11/11/2017 2221   PROTEINUR NEGATIVE 11/11/2017 2221   UROBILINOGEN 0.2 10/23/2014 0830   NITRITE NEGATIVE 11/11/2017 2221   LEUKOCYTESUR TRACE (A) 11/11/2017 2221   Sepsis Labs Invalid input(s): PROCALCITONIN,  WBC,  LACTICIDVEN Microbiology Recent Results (from the past 240 hour(s))  Blood Culture (routine x 2)     Status: None   Collection Time: 11/11/17  8:03 AM  Result Value Ref Range Status   Specimen Description BLOOD LEFT ANTECUBITAL  Final   Special Requests IN PEDIATRIC BOTTLE Blood Culture adequate volume  Final   Culture   Final    NO GROWTH 5 DAYS Performed at [facility], [address] [place], [place] [address]    Report Status 11/16/2017 FINAL  Final  Blood Culture (routine x 2)     Status: None   Collection Time: 11/11/17 11:53 AM  Result Value Ref Range Status   Specimen Description BLOOD BLOOD LEFT HAND  Final   Special Requests IN PEDIATRIC BOTTLE Blood Culture adequate volume  Final   Culture   Final    NO GROWTH 5 DAYS Performed at [facility], [address] [place], [place] [address]    Report Status 11/16/2017 FINAL  Final  C difficile quick scan w PCR reflex     Status: None   Collection Time: 11/11/17  3:55 PM  Result Value Ref Range Status   C Diff antigen NEGATIVE NEGATIVE Final   C Diff toxin NEGATIVE NEGATIVE  Final   C Diff interpretation No C. difficile detected.  Final    Comment: Performed at [facility], [address], [place], [place] [address]  Urine culture     Status: Abnormal   Collection Time: 11/11/17 10:06 PM  Result Value Ref Range Status   Specimen Description URINE, RANDOM  Final   Special Requests   Final    NONE Performed at [facility], [address] [place], [place] [address]    Culture 30,000 COLONIES/mL ENTEROCOCCUS FAECALIS (A)  Final   Report Status 11/14/2017 FINAL  Final   Organism ID, Bacteria ENTEROCOCCUS FAECALIS (A)  Final      Susceptibility   Enterococcus faecalis - MIC*    AMPICILLIN <=2 SENSITIVE Sensitive     LEVOFLOXACIN 1 SENSITIVE Sensitive     NITROFURANTOIN <=16 SENSITIVE Sensitive     VANCOMYCIN 2 SENSITIVE Sensitive     * 30,000 COLONIES/mL ENTEROCOCCUS FAECALIS  Time coordinating discharge: Over 30 minutes  SIGNED:   Alba Cory, MD  [facility] 11/17/2017, 10:41 AM Pager   If 7PM-7AM, please contact night-coverage [email or web address] Password [email or web address]

## 2017-11-17 NOTE — Plan of Care (Signed)
  Problem: Nutrition: Goal: Adequate nutrition will be maintained Outcome: Adequate for Discharge  Patient able to tolerate oral intake with frequent small one to one feedings.  Patient able to tolerate some oral supplemental nutritional drink.  Protein substitutes tolerated well.

## 2017-11-17 NOTE — Progress Notes (Signed)
[facility]:  RN  Spoke with Marylene LandAngela, [facility], who advised that per Dr. Flora Lippsagalado, patient's family has now made the decision to go home with home health services and they no longer want to pursue hospice services at home.    If you have any hospice related questions or concerns, please feel free to contact us.   Thank you,  Adele BarthelAmy Evans, RN, BSN [facility]iaison [phone]  All hospital liaisons are now on AMION.

## 2017-11-17 NOTE — Discharge Instructions (Signed)
Use imodium or lomotil for diarrhea, do not use together.

## 2017-11-17 NOTE — Progress Notes (Signed)
Pt's daughter given discharge instructions, prescriptions, and care notes. Pt's daughter verbalized understanding AEB no further questions or concerns at this time. IV was discontinued, no redness, pain, or swelling noted at this time. Telemetry discontinued and Centralized Telemetry was notified. Pt left the floor via PTAR in stable condition to go home.

## 2017-11-23 ENCOUNTER — Emergency Department (HOSPITAL_COMMUNITY)
Admission: EM | Admit: 2017-11-23 | Discharge: 2017-11-23 | Disposition: A | Discharge status: Home / Self Care | Payer: Medicare Other | Attending: Emergency Medicine | Admitting: Emergency Medicine

## 2017-11-23 ENCOUNTER — Encounter (HOSPITAL_COMMUNITY): Payer: Self-pay | Admitting: Emergency Medicine

## 2017-11-23 ENCOUNTER — Other Ambulatory Visit: Payer: Self-pay

## 2017-11-23 ENCOUNTER — Emergency Department (HOSPITAL_COMMUNITY): Payer: Medicare Other

## 2017-11-23 DIAGNOSIS — Z7902 Long term (current) use of antithrombotics/antiplatelets: Secondary | ICD-10-CM | POA: Diagnosis not present

## 2017-11-23 DIAGNOSIS — Z794 Long term (current) use of insulin: Secondary | ICD-10-CM | POA: Diagnosis not present

## 2017-11-23 DIAGNOSIS — R112 Nausea with vomiting, unspecified: Secondary | ICD-10-CM

## 2017-11-23 DIAGNOSIS — Z79899 Other long term (current) drug therapy: Secondary | ICD-10-CM | POA: Insufficient documentation

## 2017-11-23 DIAGNOSIS — N183 Chronic kidney disease, stage 3 (moderate): Secondary | ICD-10-CM | POA: Diagnosis not present

## 2017-11-23 DIAGNOSIS — R4182 Altered mental status, unspecified: Secondary | ICD-10-CM | POA: Insufficient documentation

## 2017-11-23 DIAGNOSIS — I129 Hypertensive chronic kidney disease with stage 1 through stage 4 chronic kidney disease, or unspecified chronic kidney disease: Secondary | ICD-10-CM | POA: Insufficient documentation

## 2017-11-23 DIAGNOSIS — Z87891 Personal history of nicotine dependence: Secondary | ICD-10-CM | POA: Diagnosis not present

## 2017-11-23 DIAGNOSIS — R05 Cough: Secondary | ICD-10-CM

## 2017-11-23 DIAGNOSIS — R059 Cough, unspecified: Secondary | ICD-10-CM

## 2017-11-23 DIAGNOSIS — E1122 Type 2 diabetes mellitus with diabetic chronic kidney disease: Secondary | ICD-10-CM | POA: Diagnosis not present

## 2017-11-23 LAB — COMPREHENSIVE METABOLIC PANEL
ALK PHOS: 95 U/L (ref 38–126)
ALT: 14 U/L (ref 14–54)
AST: 24 U/L (ref 15–41)
Albumin: 2.2 g/dL — ABNORMAL LOW (ref 3.5–5.0)
Anion gap: 8 (ref 5–15)
BILIRUBIN TOTAL: 0.3 mg/dL (ref 0.3–1.2)
BUN: 8 mg/dL (ref 6–20)
CALCIUM: 9.4 mg/dL (ref 8.9–10.3)
CO2: 24 mmol/L (ref 22–32)
CREATININE: 0.87 mg/dL (ref 0.44–1.00)
Chloride: 106 mmol/L (ref 101–111)
GFR, EST NON AFRICAN AMERICAN: 55 mL/min — AB (ref >60)
Glucose, Bld: 118 mg/dL — ABNORMAL HIGH (ref 65–99)
Potassium: 4.3 mmol/L (ref 3.5–5.1)
Sodium: 138 mmol/L (ref 135–145)
TOTAL PROTEIN: 7.3 g/dL (ref 6.5–8.1)

## 2017-11-23 LAB — CBC WITH DIFFERENTIAL/PLATELET
Basophils Absolute: 0 10*3/uL (ref 0.0–0.1)
Basophils Relative: 0 %
EOS PCT: 1 %
Eosinophils Absolute: 0.2 10*3/uL (ref 0.0–0.7)
HEMATOCRIT: 36.6 % (ref 36.0–46.0)
Hemoglobin: 11.1 g/dL — ABNORMAL LOW (ref 12.0–15.0)
LYMPHS ABS: 5.1 10*3/uL — AB (ref 0.7–4.0)
LYMPHS PCT: 42 %
MCH: 28.1 pg (ref 26.0–34.0)
MCHC: 30.3 g/dL (ref 30.0–36.0)
MCV: 92.7 fL (ref 78.0–100.0)
Monocytes Absolute: 0.6 10*3/uL (ref 0.1–1.0)
Monocytes Relative: 5 %
NEUTROS ABS: 6.3 10*3/uL (ref 1.7–7.7)
Neutrophils Relative %: 52 %
PLATELETS: 248 10*3/uL (ref 150–400)
RBC: 3.95 MIL/uL (ref 3.87–5.11)
RDW: 18.7 % — ABNORMAL HIGH (ref 11.5–15.5)
WBC: 12.2 10*3/uL — AB (ref 4.0–10.5)

## 2017-11-23 LAB — URINALYSIS, ROUTINE W REFLEX MICROSCOPIC
BILIRUBIN URINE: NEGATIVE
Glucose, UA: NEGATIVE mg/dL
HGB URINE DIPSTICK: NEGATIVE
KETONES UR: 5 mg/dL — AB
Leukocytes, UA: NEGATIVE
NITRITE: NEGATIVE
Protein, ur: NEGATIVE mg/dL
SPECIFIC GRAVITY, URINE: 1.015 (ref 1.005–1.030)
pH: 7 (ref 5.0–8.0)

## 2017-11-23 LAB — POC OCCULT BLOOD, ED: Fecal Occult Bld: NEGATIVE

## 2017-11-23 MED ORDER — SODIUM CHLORIDE 0.9 % IV BOLUS
500.0000 mL | Freq: Once | INTRAVENOUS | Status: AC
  Administered 2017-11-23: 500 mL via INTRAVENOUS

## 2017-11-23 MED ORDER — ONDANSETRON 4 MG PO TBDP: 4.0000 mg | ORAL_TABLET | Freq: Three times a day (TID) | ORAL | 0 refills | Status: AC | PRN

## 2017-11-23 MED ORDER — ENSURE ENLIVE PO LIQD
1.0000 | Freq: Once | ORAL | Status: AC
  Administered 2017-11-23: 237 mL via ORAL
  Filled 2017-11-23: qty 237

## 2017-11-23 MED ORDER — ONDANSETRON HCL 4 MG/2ML IJ SOLN
4.0000 mg | Freq: Once | INTRAMUSCULAR | Status: AC
  Administered 2017-11-23: 4 mg via INTRAVENOUS
  Filled 2017-11-23: qty 2

## 2017-11-23 NOTE — ED Provider Notes (Signed)
[facility] EMERGENCY DEPARTMENT Provider Note   CSN: [id number] Arrival date & time: 11/23/17  4098     History   Chief Complaint Chief Complaint  Patient presents with  . Altered Mental Status    HPI Megan [name] is a 84 y.o. female.  HPI  A LEVEL 5 CAVEAT PERTAINS DUE TO DEMENTIA Patient with history of diabetes, strokes, seizures aspiration pneumonia and UTI presents with complaint of nausea and vomiting and decreased mental status.  She was recently hospitalized for aspiration pneumonia and palliative care and home hospice was advised.  However her daughter decided against hospice.  She states patient ate a small amount yesterday at home and then last night began to have numerous episodes of vomiting, nonbloody and nonbilious.  She has seemed to have decreased mental status from her baseline today and family is concerned that she is dehydrated.  No diarrhea- had one formed stool yesterday.    Past Medical History:  Diagnosis Date  . Diabetes mellitus   . Hyperlipidemia   . Seizure (HCC)   . Stroke [facility]) 2012   possible stroke Jan 2016  . UTI (urinary tract infection) 10/2017    Patient Active Problem List   Diagnosis Date Noted  . Palliative care encounter   . Sepsis (HCC) 11/11/2017  . Aspiration pneumonia due to gastric secretions (HCC)   . Encounter for hospice care discussion   . Palliative care by specialist   . Increased ammonia level 11/01/2017  . Acute metabolic encephalopathy 11/01/2017  . HCAP (healthcare-associated pneumonia) 11/01/2017  . Pressure injury of skin 10/26/2017  . UTI (urinary tract infection) 10/26/2017  . Heel ulcer (HCC) 06/18/2017  . Unstageable pressure ulcer of sacral region (HCC) 06/18/2017  . Decubitus ulcer of sacral region, stage 2   . Idiopathic hypotension   . Seizure (HCC) 05/21/2017  . Hyponatremia 05/12/2017  . Acute renal failure superimposed on stage 3 chronic kidney disease (HCC) 10/23/2014  .  Dehydration 10/23/2014  . Acute hyperkalemia 10/23/2014  . Abnormal urinalysis 10/23/2014  . Chest pain 04/24/2014  . Altered mental state 08/08/2013  . Dementia with Lewy bodies 08/07/2013  . Vascular dementia 08/07/2013  . Sleepiness 08/07/2013  . Anemia 07/21/2013  . Protein-calorie malnutrition, severe (HCC) 07/15/2013  . Herpes zoster 07/13/2013  . Spastic hemiplegia affecting dominant side (HCC) 06/25/2013  . History of CVA w/residual right side deficits 09/03/2012  . History of partial seizures   . TIA (transient ischemic attack) 01/06/2012  . Right upper lobe pneumonia (HCC) 01/06/2012  . Diabetes mellitus type II, uncontrolled (HCC) 01/06/2012  . HTN (hypertension) 01/06/2012  . H/O 01/06/2012    Past Surgical History:  Procedure Laterality Date  . CATARACT EXTRACTION       OB History   None      Home Medications    Prior to Admission medications   Medication Sig Start Date End Date Taking? Authorizing Provider  acetaminophen (TYLENOL) 325 MG tablet Take 2 tablets (650 mg total) by mouth every 6 (six) hours as needed for mild pain (or Fever >/= 101). 11/04/17  Yes Alwyn Ren, MD  Amino Acids-Protein Hydrolys (FEEDING SUPPLEMENT, PRO-STAT SUGAR FREE 64,) LIQD Take 30 mLs by mouth 4 (four) times daily -  with meals and at bedtime. 10/28/17  Yes Narda Bonds, MD  aspirin EC 81 MG tablet Take 1 tablet (81 mg total) by mouth daily. 11/17/17 11/17/18 Yes Regalado, Belkys A, MD  budesonide (PULMICORT) 0.25 MG/2ML nebulizer solution Take  2 mLs (0.25 mg total) by nebulization 2 (two) times daily. 11/17/17  Yes Regalado, Belkys A, MD  clopidogrel (PLAVIX) 75 MG tablet Take 1 tablet (75 mg total) by mouth daily with breakfast. 08/15/13  Yes [name], MD  diphenoxylate-atropine (LOMOTIL) 2.5-0.025 MG/5ML liquid Take 5 mLs by mouth 4 (four) times daily as needed for diarrhea or loose stools. 11/17/17  Yes Regalado, Belkys A, MD  famotidine (PEPCID) 20 MG tablet Take 1  tablet (20 mg total) by mouth daily. 10/29/17  Yes Narda Bonds, MD  ferrous sulfate 325 (65 FE) MG tablet Take 325 mg by mouth daily with breakfast.   Yes [provider]  guaiFENesin (ROBITUSSIN) 100 MG/5ML SOLN Take 10 mLs (200 mg total) by mouth 4 (four) times daily. 11/17/17  Yes Regalado, Belkys A, MD  ibuprofen (ADVIL,MOTRIN) 100 MG/5ML suspension Take 20 mLs (400 mg total) by mouth every 6 (six) hours as needed (prn lef arm pain). 11/17/17  Yes Regalado, Belkys A, MD  insulin aspart (NOVOLOG) 100 UNIT/ML injection Inject 2 Units into the skin 3 (three) times daily with meals. Please inject 2 units if patient eats more than 50 % meals and if Blood sugar above 160 11/17/17  Yes Regalado, Belkys A, MD  levalbuterol (XOPENEX) 0.63 MG/3ML nebulizer solution Take 3 mLs (0.63 mg total) by nebulization 3 (three) times daily. 11/17/17  Yes Regalado, Belkys A, MD  loperamide (IMODIUM A-D) 2 MG tablet Take 1 tablet (2 mg total) by mouth 4 (four) times daily as needed for diarrhea or loose stools. 11/17/17  Yes Regalado, Belkys A, MD  Melatonin 3 MG TABS Take 1 tablet (3 mg total) by mouth at bedtime as needed (sleep). 11/17/17  Yes Regalado, Belkys A, MD  memantine (NAMENDA XR) 14 MG CP24 24 hr capsule Take 14 mg by mouth at bedtime.    Yes [provider]  Multiple Vitamin (MULTIVITAMIN WITH MINERALS) TABS tablet Take 1 tablet by mouth daily. 11/18/17  Yes Regalado, Belkys A, MD  NYSTATIN powder Apply 1 application topically 2 (two) times daily as needed for rash. 05/04/17  Yes [provider]  ondansetron (ZOFRAN) 4 MG tablet Take 1 tablet (4 mg total) by mouth every 6 (six) hours as needed for nausea. 11/17/17  Yes Regalado, Belkys A, MD  protein supplement shake (PREMIER PROTEIN) LIQD Take 59.1 mLs (2 oz total) by mouth 4 (four) times daily -  with meals and at bedtime. 10/28/17  Yes Narda Bonds, MD  rosuvastatin (CRESTOR) 5 MG tablet Take 5 mg by mouth every Monday, Wednesday, and  Friday. At bedtime   Yes [provider]  Valproate Sodium (DEPAKENE) 250 MG/5ML SOLN solution Take 5 mLs (250 mg total) by mouth 2 (two) times daily. 11/17/17  Yes Regalado, Belkys A, MD  vitamin C (ASCORBIC ACID) 500 MG tablet Take 500 mg by mouth daily.   Yes [provider]  zinc oxide (BALMEX) 11.3 % CREA cream Apply 1 application topically daily as needed (for rash).    Yes [provider]  lactobacillus acidophilus (BACID) TABS tablet Take 2 tablets by mouth 3 (three) times daily. Patient not taking: Reported on 11/23/2017 11/17/17   Regalado, Jon Billings A, MD  metroNIDAZOLE (FLAGYL) 50 mg/ml oral suspension Take 10 mLs (500 mg total) by mouth 3 (three) times daily. Patient not taking: Reported on 11/23/2017 11/17/17   Regalado, Jon Billings A, MD  ondansetron (ZOFRAN ODT) 4 MG disintegrating tablet Take 1 tablet (4 mg total) by mouth every  8 (eight) hours as needed. 11/23/17   Terryn Redner, Latanya MaudlinMartha L, MD    Family History Family History  Problem Relation Age of Onset  . CAD Mother   . Hypertension Mother   . Diabetes Mother   . Stroke Mother     Social History Social History   Tobacco Use  . Smoking status: Never Smoker  . Smokeless tobacco: Former NeurosurgeonUser    Types: Snuff  . Tobacco comment: quit 50-60 years ago  Substance Use Topics  . Alcohol use: No    Alcohol/week: 0.0 oz  . Drug use: No     Allergies   Lipitor [atorvastatin]   Review of Systems Review of Systems  UNABLE TO OBTAIN ROS DUE TO LEVEL 5 CAVEAT   Physical Exam Updated Vital Signs BP (!) 105/47 (BP Location: Right Arm)   Pulse 64   Temp 98.8 F (37.1 C) (Oral)   Resp 13   SpO2 97%  Vitals reviewed Physical Exam  Physical Examination: General appearance - alert, well appearing, and in no distress Mental status - alert, oriented to person, place, and time Eyes - no conjunctival injection, no scleral icterus Mouth - mucous membranes moist, pharynx normal without lesions Chest - clear to  auscultation, no wheezes, rales or rhonchi, symmetric air entry Heart - normal rate, regular rhythm, normal S1, S2, no murmurs, rubs, clicks or gallops Abdomen - soft, nontender, nondistended, no masses or organomegaly Neurological - alert, nonverbal, not able to assess orientation, right sided weakness and facial droop Extremities - peripheral pulses normal, no pedal edema, no clubbing or cyanosis Skin - normal coloration and turgor, no rashes   ED Treatments / Results  Labs (all labs ordered are listed, but only abnormal results are displayed) Labs Reviewed  CBC WITH DIFFERENTIAL/PLATELET - Abnormal; Notable for the following components:      Result Value   WBC 12.2 (*)    Hemoglobin 11.1 (*)    RDW 18.7 (*)    Lymphs Abs 5.1 (*)    All other components within normal limits  COMPREHENSIVE METABOLIC PANEL - Abnormal; Notable for the following components:   Glucose, Bld 118 (*)    Albumin 2.2 (*)    GFR calc non Af Amer 55 (*)    All other components within normal limits  URINALYSIS, ROUTINE W REFLEX MICROSCOPIC - Abnormal; Notable for the following components:   Ketones, ur 5 (*)    All other components within normal limits  URINE CULTURE  POC OCCULT BLOOD, ED    EKG EKG Interpretation  Date/Time:  Wednesday November 23 2017 09:26:25 EDT Ventricular Rate:  77 PR Interval:    QRS Duration: 72 QT Interval:  353 QTC Calculation: 400 R Axis:   -8 Text Interpretation:  Sinus rhythm Prolonged PR interval Low voltage, precordial leads No significant change since last tracing Confirmed by Jerelyn ScottLinker, Italia Wolfert [phone]([id number]) on 11/23/2017 9:42:06 AM   Radiology Dg Chest 1 View  Result Date: 11/23/2017 CLINICAL DATA:  Altered mental status with nausea and vomiting EXAM: CHEST  1 VIEW COMPARISON:  November 15, 2017 FINDINGS: There are persistent small pleural effusions with bibasilar atelectasis. Lungs elsewhere are clear. Heart size and pulmonary vascularity are normal. No adenopathy. There is aortic  atherosclerosis. No bone lesions. IMPRESSION: Small pleural effusions with bibasilar atelectasis. No new opacity. Stable cardiac silhouette. There is aortic atherosclerosis. Aortic Atherosclerosis (ICD10-I70.0). Electronically Signed   By: Bretta BangWilliam  Woodruff III M.D.   On: 11/23/2017 10:18    Procedures Procedures (including critical  care time)  Medications Ordered in ED Medications  ondansetron (ZOFRAN) injection 4 mg (4 mg Intravenous Given 11/23/17 1109)  sodium chloride 0.9 % bolus 500 mL (0 mLs Intravenous Stopped 11/23/17 1209)  sodium chloride 0.9 % bolus 500 mL (0 mLs Intravenous Stopped 11/23/17 1427)  feeding supplement (ENSURE ENLIVE) (ENSURE ENLIVE) liquid 237 mL (237 mLs Oral Given 11/23/17 1427)  ondansetron (ZOFRAN) injection 4 mg (4 mg Intravenous Given 11/23/17 1518)     Initial Impression / Assessment and Plan / ED Course  I have reviewed the triage vital signs and the nursing notes.  Pertinent labs & imaging results that were available during my care of the patient were reviewed by me and considered in my medical decision making (see chart for details).    3:00 PM  Pt has had a small amount of ensure without further vomiting.  I have discussed with family the plan for discharge.  I have advised them to call hospice as was advised during recent hospital admission.  Pt is not significantly dehydrated today, has received IV fluids.  No sign of aspiration pneumonia, UTI on workup.  I do not feel patient would benefit from hospital admission at this time.  Family is agreeable with plan for discharge.  Will give rx for ODT zofran for home use as well.      Final Clinical Impressions(s) / ED Diagnoses   Final diagnoses:  Nausea and vomiting, intractability of vomiting not specified, unspecified vomiting type    ED Discharge Orders        Ordered    ondansetron (ZOFRAN ODT) 4 MG disintegrating tablet  Every 8 hours PRN     11/23/17 1503       Phillis Haggis, MD 11/23/17  1614

## 2017-11-23 NOTE — ED Triage Notes (Signed)
Pt arrives via EMS from home with altered mental status for the last 3 days. Per family had nausea/vomiting since yesterday. Decreased urine output. Afebrile. Hx of prev stroke with right sided deficit. VSS. CBG 167. Pt awake, NAD at present.

## 2017-11-23 NOTE — ED Notes (Signed)
PTAR contacted for tx home  

## 2017-11-23 NOTE — Discharge Instructions (Signed)
Return to the ED with any concerns including vomiting and not able to keep down liquids or your medications, abdominal pain especially if it localizes to the right lower abdomen, fever or chills, and decreased urine output, decreased level of alertness or lethargy, or any other alarming symptoms.  °

## 2017-11-24 DIAGNOSIS — A419 Sepsis, unspecified organism: Secondary | ICD-10-CM

## 2017-11-24 HISTORY — DX: Sepsis, unspecified organism: A41.9

## 2017-11-25 LAB — URINE CULTURE: Culture: 40000 — AB

## 2017-11-26 ENCOUNTER — Telehealth: Payer: Self-pay

## 2017-11-26 NOTE — Progress Notes (Signed)
ED Antimicrobial Stewardship Positive Culture Follow Up   Megan [name] is an 84 y.o. female who presented to [facility] on 11/23/2017 with a chief complaint of  Chief Complaint  Patient presents with  . Altered Mental Status    Recent Results (from the past 720 hour(s))  Urine culture     Status: None   Collection Time: 10/31/17  9:20 PM  Result Value Ref Range Status   Specimen Description URINE, RANDOM  Final   Special Requests NONE  Final   Culture   Final    NO GROWTH Performed at [facility], [address] [place], [place] [address]    Report Status 11/02/2017 FINAL  Final  Respiratory Panel by PCR     Status: None   Collection Time: 11/02/17  4:25 PM  Result Value Ref Range Status   Adenovirus NOT DETECTED NOT DETECTED Final   Coronavirus 229E NOT DETECTED NOT DETECTED Final   Coronavirus HKU1 NOT DETECTED NOT DETECTED Final   Coronavirus NL63 NOT DETECTED NOT DETECTED Final   Coronavirus OC43 NOT DETECTED NOT DETECTED Final   Metapneumovirus NOT DETECTED NOT DETECTED Final   Rhinovirus / Enterovirus NOT DETECTED NOT DETECTED Final   Influenza A NOT DETECTED NOT DETECTED Final   Influenza B NOT DETECTED NOT DETECTED Final   Parainfluenza Virus 1 NOT DETECTED NOT DETECTED Final   Parainfluenza Virus 2 NOT DETECTED NOT DETECTED Final   Parainfluenza Virus 3 NOT DETECTED NOT DETECTED Final   Parainfluenza Virus 4 NOT DETECTED NOT DETECTED Final   Respiratory Syncytial Virus NOT DETECTED NOT DETECTED Final   Bordetella pertussis NOT DETECTED NOT DETECTED Final   Chlamydophila pneumoniae NOT DETECTED NOT DETECTED Final   Mycoplasma pneumoniae NOT DETECTED NOT DETECTED Final    Comment: Performed at [facility], [address] [place], [place] [address]  Culture, blood (routine x 2)     Status: None   Collection Time: 11/03/17 11:00 AM  Result Value Ref Range Status   Specimen Description BLOOD LEFT ANTECUBITAL  Final   Special Requests   Final    BOTTLES  DRAWN AEROBIC AND ANAEROBIC Blood Culture results may not be optimal due to an excessive volume of blood received in culture bottles   Culture   Final    NO GROWTH 5 DAYS Performed at [facility], [address] [place], [place] [address]    Report Status 11/08/2017 FINAL  Final  Culture, blood (routine x 2)     Status: None   Collection Time: 11/03/17 11:10 AM  Result Value Ref Range Status   Specimen Description BLOOD LEFT HAND  Final   Special Requests   Final    BOTTLES DRAWN AEROBIC AND ANAEROBIC Blood Culture adequate volume   Culture   Final    NO GROWTH 5 DAYS Performed at [facility], [address] [place], [place] [address]    Report Status 11/08/2017 FINAL  Final  Blood Culture (routine x 2)     Status: None   Collection Time: 11/11/17  8:03 AM  Result Value Ref Range Status   Specimen Description BLOOD LEFT ANTECUBITAL  Final   Special Requests IN PEDIATRIC BOTTLE Blood Culture adequate volume  Final   Culture   Final    NO GROWTH 5 DAYS Performed at [facility], [address] [place], [place] [address]    Report Status 11/16/2017 FINAL  Final  Blood Culture (routine x 2)  Status: None   Collection Time: 11/11/17 11:53 AM  Result Value Ref Range Status   Specimen Description BLOOD BLOOD LEFT HAND  Final   Special Requests IN PEDIATRIC BOTTLE Blood Culture adequate volume  Final   Culture   Final    NO GROWTH 5 DAYS Performed at [facility], [address], [place], [place] [address]    Report Status 11/16/2017 FINAL  Final  C difficile quick scan w PCR reflex     Status: None   Collection Time: 11/11/17  3:55 PM  Result Value Ref Range Status   C Diff antigen NEGATIVE NEGATIVE Final   C Diff toxin NEGATIVE NEGATIVE Final   C Diff interpretation No C. difficile detected.  Final    Comment: Performed at [facility], [address] [place], [place] [address]  Urine culture     Status: Abnormal   Collection Time:  11/11/17 10:06 PM  Result Value Ref Range Status   Specimen Description URINE, RANDOM  Final   Special Requests   Final    NONE Performed at [facility], [address] [place], [place] [address]    Culture 30,000 COLONIES/mL ENTEROCOCCUS FAECALIS (A)  Final   Report Status 11/14/2017 FINAL  Final   Organism ID, Bacteria ENTEROCOCCUS FAECALIS (A)  Final      Susceptibility   Enterococcus faecalis - MIC*    AMPICILLIN <=2 SENSITIVE Sensitive     LEVOFLOXACIN 1 SENSITIVE Sensitive     NITROFURANTOIN <=16 SENSITIVE Sensitive     VANCOMYCIN 2 SENSITIVE Sensitive     * 30,000 COLONIES/mL ENTEROCOCCUS FAECALIS  Urine Culture     Status: Abnormal   Collection Time: 11/23/17 11:35 AM  Result Value Ref Range Status   Specimen Description URINE, RANDOM  Final   Special Requests   Final    NONE Performed at [facility], [address] [place], [place] [address]    Culture (A)  Final    40,000 COLONIES/mL ENTEROBACTER AEROGENES 40,000 COLONIES/mL PSEUDOMONAS AERUGINOSA    Report Status 11/25/2017 FINAL  Final   Organism ID, Bacteria ENTEROBACTER AEROGENES (A)  Final   Organism ID, Bacteria PSEUDOMONAS AERUGINOSA (A)  Final      Susceptibility   Enterobacter aerogenes - MIC*    CEFAZOLIN >=64 RESISTANT Resistant     CEFTRIAXONE >=64 RESISTANT Resistant     CIPROFLOXACIN 1 SENSITIVE Sensitive     GENTAMICIN <=1 SENSITIVE Sensitive     IMIPENEM 1 SENSITIVE Sensitive     NITROFURANTOIN 128 RESISTANT Resistant     TRIMETH/SULFA >=320 RESISTANT Resistant     PIP/TAZO 8 SENSITIVE Sensitive     * 40,000 COLONIES/mL ENTEROBACTER AEROGENES   Pseudomonas aeruginosa - MIC*    CEFTAZIDIME <=1 SENSITIVE Sensitive     CIPROFLOXACIN 1 SENSITIVE Sensitive     GENTAMICIN 2 SENSITIVE Sensitive     IMIPENEM <=0.25 SENSITIVE Sensitive     PIP/TAZO <=4 SENSITIVE Sensitive     CEFEPIME 4 SENSITIVE Sensitive     * 40,000 COLONIES/mL PSEUDOMONAS AERUGINOSA    89 YOF with recent issues  related to aspiration PNA who presented to the ED on 4/3 with N/V and decreased mental status - family concerned for dehydration. No specific UTI symptoms noted. WBC 12.2, Afeb (98.8), UA neg for infection however cultures grew 40k Enterobacter and Pseudomonas.  Discussed with daughter Megan [name]) who works in Administrator, arts. The patient's mental status is demented at baseline, could be cross-contamination with  diaper/diarrhea. They have a follow-up with the PCP this week and can recheck a urine culture if still concerned.  New antibiotic prescription: No antibiotics needed at this time.  ED Provider: Graciella Freer, PA-C  Rolley Sims 11/26/2017, 11:11 AM Infectious Diseases Pharmacist Phone# [phone]

## 2017-11-26 NOTE — Telephone Encounter (Signed)
No treatment for UC ED 11/23/17 Per Graciella FreerLindsey Layden PA

## 2017-12-03 ENCOUNTER — Emergency Department (HOSPITAL_COMMUNITY): Payer: Medicare Other

## 2017-12-03 ENCOUNTER — Other Ambulatory Visit: Payer: Self-pay

## 2017-12-03 ENCOUNTER — Emergency Department (HOSPITAL_COMMUNITY)
Admission: EM | Admit: 2017-12-03 | Discharge: 2017-12-04 | Disposition: A | Discharge status: Home / Self Care | Payer: Medicare Other | Source: Home / Self Care | Attending: Emergency Medicine | Admitting: Emergency Medicine

## 2017-12-03 ENCOUNTER — Encounter (HOSPITAL_COMMUNITY): Payer: Self-pay | Admitting: Emergency Medicine

## 2017-12-03 DIAGNOSIS — Z7902 Long term (current) use of antithrombotics/antiplatelets: Secondary | ICD-10-CM

## 2017-12-03 DIAGNOSIS — Z7982 Long term (current) use of aspirin: Secondary | ICD-10-CM | POA: Insufficient documentation

## 2017-12-03 DIAGNOSIS — G9341 Metabolic encephalopathy: Secondary | ICD-10-CM | POA: Diagnosis not present

## 2017-12-03 DIAGNOSIS — I129 Hypertensive chronic kidney disease with stage 1 through stage 4 chronic kidney disease, or unspecified chronic kidney disease: Secondary | ICD-10-CM

## 2017-12-03 DIAGNOSIS — J690 Pneumonitis due to inhalation of food and vomit: Secondary | ICD-10-CM | POA: Diagnosis not present

## 2017-12-03 DIAGNOSIS — Z794 Long term (current) use of insulin: Secondary | ICD-10-CM

## 2017-12-03 DIAGNOSIS — Z79899 Other long term (current) drug therapy: Secondary | ICD-10-CM

## 2017-12-03 DIAGNOSIS — E1122 Type 2 diabetes mellitus with diabetic chronic kidney disease: Secondary | ICD-10-CM

## 2017-12-03 DIAGNOSIS — E860 Dehydration: Secondary | ICD-10-CM

## 2017-12-03 DIAGNOSIS — F0390 Unspecified dementia without behavioral disturbance: Secondary | ICD-10-CM | POA: Insufficient documentation

## 2017-12-03 DIAGNOSIS — Z8673 Personal history of transient ischemic attack (TIA), and cerebral infarction without residual deficits: Secondary | ICD-10-CM | POA: Insufficient documentation

## 2017-12-03 DIAGNOSIS — R5381 Other malaise: Secondary | ICD-10-CM | POA: Insufficient documentation

## 2017-12-03 DIAGNOSIS — N183 Chronic kidney disease, stage 3 (moderate): Secondary | ICD-10-CM

## 2017-12-03 HISTORY — DX: Sepsis, unspecified organism: A41.9

## 2017-12-03 HISTORY — DX: Unspecified dementia, unspecified severity, without behavioral disturbance, psychotic disturbance, mood disturbance, and anxiety: F03.90

## 2017-12-03 HISTORY — DX: Pneumonia, unspecified organism: J18.9

## 2017-12-03 LAB — CBC
HEMATOCRIT: 34.8 % — AB (ref 36.0–46.0)
HEMOGLOBIN: 10.7 g/dL — AB (ref 12.0–15.0)
MCH: 28.5 pg (ref 26.0–34.0)
MCHC: 30.7 g/dL (ref 30.0–36.0)
MCV: 92.8 fL (ref 78.0–100.0)
Platelets: 218 10*3/uL (ref 150–400)
RBC: 3.75 MIL/uL — ABNORMAL LOW (ref 3.87–5.11)
RDW: 17.7 % — AB (ref 11.5–15.5)
WBC: 10 10*3/uL (ref 4.0–10.5)

## 2017-12-03 LAB — URINALYSIS, ROUTINE W REFLEX MICROSCOPIC
BILIRUBIN URINE: NEGATIVE
GLUCOSE, UA: NEGATIVE mg/dL
Hgb urine dipstick: NEGATIVE
KETONES UR: NEGATIVE mg/dL
LEUKOCYTES UA: NEGATIVE
Nitrite: NEGATIVE
PH: 5 (ref 5.0–8.0)
Protein, ur: NEGATIVE mg/dL
SPECIFIC GRAVITY, URINE: 1.02 (ref 1.005–1.030)

## 2017-12-03 LAB — I-STAT CG4 LACTIC ACID, ED
LACTIC ACID, VENOUS: 1.2 mmol/L (ref 0.5–1.9)
Lactic Acid, Venous: 1.25 mmol/L (ref 0.5–1.9)

## 2017-12-03 LAB — BASIC METABOLIC PANEL
ANION GAP: 8 (ref 5–15)
BUN: 27 mg/dL — AB (ref 6–20)
CHLORIDE: 108 mmol/L (ref 101–111)
CO2: 24 mmol/L (ref 22–32)
Calcium: 9.4 mg/dL (ref 8.9–10.3)
Creatinine, Ser: 1.17 mg/dL — ABNORMAL HIGH (ref 0.44–1.00)
GFR calc Af Amer: 45 mL/min — ABNORMAL LOW (ref >60)
GFR calc non Af Amer: 39 mL/min — ABNORMAL LOW (ref >60)
Glucose, Bld: 134 mg/dL — ABNORMAL HIGH (ref 65–99)
POTASSIUM: 4.3 mmol/L (ref 3.5–5.1)
Sodium: 140 mmol/L (ref 135–145)

## 2017-12-03 LAB — CBG MONITORING, ED: Glucose-Capillary: 126 mg/dL — ABNORMAL HIGH (ref 65–99)

## 2017-12-03 MED ORDER — CEPHALEXIN 250 MG PO CAPS: 250.0000 mg | ORAL_CAPSULE | Freq: Once | ORAL | Status: DC

## 2017-12-03 MED ORDER — SODIUM CHLORIDE 0.9 % IV BOLUS
1000.0000 mL | Freq: Once | INTRAVENOUS | Status: AC
  Administered 2017-12-03: 1000 mL via INTRAVENOUS

## 2017-12-03 NOTE — ED Notes (Signed)
Per pt family, this RN has poor bedside manner due to this RN going in to inform family of care plan and ask about pt d/c. Charge RN made aware.

## 2017-12-03 NOTE — ED Notes (Signed)
Applesauce and apple juice provided, pt daughter/caregiver helping pt eat/drink at this time.

## 2017-12-03 NOTE — ED Notes (Signed)
Portable xray at bedside.

## 2017-12-03 NOTE — ED Triage Notes (Signed)
Pt BIB EMS from home for generalized weakness and poor intake x 2 days; per daughter/caregiver pt recently admitted  for pneumonia/sepsis and over the past couple of days has been lethargic. Pt alert at this time, unable to answer questions per hx of dementia and CVA, rt sided deficits at baseline. Hypotensive PTA, received 200 cc NS with EMS. Resp e/u. VSS. Afebrile.

## 2017-12-03 NOTE — ED Notes (Signed)
Bladder scan volume 482.

## 2017-12-03 NOTE — Discharge Instructions (Addendum)
Continue to offer her food and fluids, regularly.  Watch for signs of complications from aspiration including fever, trouble breathing, and low oxygen level.  Call her home health service, and ask them to get some blood to check her white blood cell count and kidney function next Tuesday.  Return here, if needed, for problems.

## 2017-12-03 NOTE — ED Notes (Signed)
Notified [facility] for transportation back home 

## 2017-12-03 NOTE — ED Provider Notes (Addendum)
[facility] EMERGENCY DEPARTMENT Provider Note   CSN: [id number] Arrival date & time: 12/03/17  1929     History   Chief Complaint Chief Complaint  Patient presents with  . Weakness  . Hypotension    HPI Megan [name] is a 84 y.o. female.  The patient arrives for evaluation of generalized weakness, decreased appetite and follow-up on recent admission for "pneumonia."  She is reported to be increasingly lethargic.  She has been offered hospice care, at her home, but the daughter has declined it.  For the last day she has had decreased oral intake, usually family feeds her, but she has not wanted much.  She has been able to take medicines with applesauce today.  She has had diarrhea frequently since being on antibiotics for the last 2 weeks.  There is been no cough, choking, fever or vomiting.  Patient cannot give any history.   Level 5 caveat- dementia  HPI  Past Medical History:  Diagnosis Date  . Dementia   . Diabetes mellitus   . Hyperlipidemia   . Pneumonia 10/2017  . Seizure (HCC)   . Sepsis (HCC) 11/24/2017  . Stroke [facility]) 2012   possible stroke Jan 2016  . UTI (urinary tract infection) 10/2017    Patient Active Problem List   Diagnosis Date Noted  . Palliative care encounter   . Sepsis (HCC) 11/11/2017  . Aspiration pneumonia due to gastric secretions (HCC)   . Encounter for hospice care discussion   . Palliative care by specialist   . Increased ammonia level 11/01/2017  . Acute metabolic encephalopathy 11/01/2017  . HCAP (healthcare-associated pneumonia) 11/01/2017  . Pressure injury of skin 10/26/2017  . UTI (urinary tract infection) 10/26/2017  . Heel ulcer (HCC) 06/18/2017  . Unstageable pressure ulcer of sacral region (HCC) 06/18/2017  . Decubitus ulcer of sacral region, stage 2   . Idiopathic hypotension   . Seizure (HCC) 05/21/2017  . Hyponatremia 05/12/2017  . Acute renal failure superimposed on stage 3 chronic kidney disease  (HCC) 10/23/2014  . Dehydration 10/23/2014  . Acute hyperkalemia 10/23/2014  . Abnormal urinalysis 10/23/2014  . Chest pain 04/24/2014  . Altered mental state 08/08/2013  . Dementia with Lewy bodies 08/07/2013  . Vascular dementia 08/07/2013  . Sleepiness 08/07/2013  . Anemia 07/21/2013  . Protein-calorie malnutrition, severe (HCC) 07/15/2013  . Herpes zoster 07/13/2013  . Spastic hemiplegia affecting dominant side (HCC) 06/25/2013  . History of CVA w/residual right side deficits 09/03/2012  . History of partial seizures   . TIA (transient ischemic attack) 01/06/2012  . Right upper lobe pneumonia (HCC) 01/06/2012  . Diabetes mellitus type II, uncontrolled (HCC) 01/06/2012  . HTN (hypertension) 01/06/2012  . H/O 01/06/2012    Past Surgical History:  Procedure Laterality Date  . CATARACT EXTRACTION       OB History   None      Home Medications    Prior to Admission medications   Medication Sig Start Date End Date Taking? Authorizing Provider  acetaminophen (TYLENOL) 325 MG tablet Take 2 tablets (650 mg total) by mouth every 6 (six) hours as needed for mild pain (or Fever >/= 101). 11/04/17   Alwyn Ren, MD  Amino Acids-Protein Hydrolys (FEEDING SUPPLEMENT, PRO-STAT SUGAR FREE 64,) LIQD Take 30 mLs by mouth 4 (four) times daily -  with meals and at bedtime. 10/28/17   Narda Bonds, MD  aspirin EC 81 MG tablet Take 1 tablet (81 mg total) by mouth daily.  11/17/17 11/17/18  Regalado, Belkys A, MD  budesonide (PULMICORT) 0.25 MG/2ML nebulizer solution Take 2 mLs (0.25 mg total) by nebulization 2 (two) times daily. 11/17/17   Regalado, Jon Billings A, MD  clopidogrel (PLAVIX) 75 MG tablet Take 1 tablet (75 mg total) by mouth daily with breakfast. 08/15/13   [name], MD  diphenoxylate-atropine (LOMOTIL) 2.5-0.025 MG/5ML liquid Take 5 mLs by mouth 4 (four) times daily as needed for diarrhea or loose stools. 11/17/17   Regalado, Belkys A, MD  famotidine (PEPCID) 20 MG tablet  Take 1 tablet (20 mg total) by mouth daily. 10/29/17   Narda Bonds, MD  ferrous sulfate 325 (65 FE) MG tablet Take 325 mg by mouth daily with breakfast.    [provider]  guaiFENesin (ROBITUSSIN) 100 MG/5ML SOLN Take 10 mLs (200 mg total) by mouth 4 (four) times daily. 11/17/17   Regalado, Belkys A, MD  ibuprofen (ADVIL,MOTRIN) 100 MG/5ML suspension Take 20 mLs (400 mg total) by mouth every 6 (six) hours as needed (prn lef arm pain). 11/17/17   Regalado, Belkys A, MD  insulin aspart (NOVOLOG) 100 UNIT/ML injection Inject 2 Units into the skin 3 (three) times daily with meals. Please inject 2 units if patient eats more than 50 % meals and if Blood sugar above 160 11/17/17   Regalado, Belkys A, MD  lactobacillus acidophilus (BACID) TABS tablet Take 2 tablets by mouth 3 (three) times daily. Patient not taking: Reported on 11/23/2017 11/17/17   Regalado, Jon Billings A, MD  levalbuterol (XOPENEX) 0.63 MG/3ML nebulizer solution Take 3 mLs (0.63 mg total) by nebulization 3 (three) times daily. 11/17/17   Regalado, Belkys A, MD  loperamide (IMODIUM A-D) 2 MG tablet Take 1 tablet (2 mg total) by mouth 4 (four) times daily as needed for diarrhea or loose stools. 11/17/17   Regalado, Belkys A, MD  Melatonin 3 MG TABS Take 1 tablet (3 mg total) by mouth at bedtime as needed (sleep). 11/17/17   Regalado, Belkys A, MD  memantine (NAMENDA XR) 14 MG CP24 24 hr capsule Take 14 mg by mouth at bedtime.     [provider]  metroNIDAZOLE (FLAGYL) 50 mg/ml oral suspension Take 10 mLs (500 mg total) by mouth 3 (three) times daily. Patient not taking: Reported on 11/23/2017 11/17/17   Hartley Barefoot A, MD  Multiple Vitamin (MULTIVITAMIN WITH MINERALS) TABS tablet Take 1 tablet by mouth daily. 11/18/17   Regalado, Belkys A, MD  NYSTATIN powder Apply 1 application topically 2 (two) times daily as needed for rash. 05/04/17   [provider]  ondansetron (ZOFRAN ODT) 4 MG disintegrating tablet Take 1 tablet (4 mg  total) by mouth every 8 (eight) hours as needed. 11/23/17   Mabe, Latanya Maudlin, MD  ondansetron (ZOFRAN) 4 MG tablet Take 1 tablet (4 mg total) by mouth every 6 (six) hours as needed for nausea. 11/17/17   Regalado, Belkys A, MD  protein supplement shake (PREMIER PROTEIN) LIQD Take 59.1 mLs (2 oz total) by mouth 4 (four) times daily -  with meals and at bedtime. 10/28/17   Narda Bonds, MD  rosuvastatin (CRESTOR) 5 MG tablet Take 5 mg by mouth every Monday, Wednesday, and Friday. At bedtime    [provider]  Valproate Sodium (DEPAKENE) 250 MG/5ML SOLN solution Take 5 mLs (250 mg total) by mouth 2 (two) times daily. 11/17/17   Regalado, Belkys A, MD  vitamin C (ASCORBIC ACID) 500 MG tablet Take 500 mg by mouth daily.  [provider]  zinc oxide (BALMEX) 11.3 % CREA cream Apply 1 application topically daily as needed (for rash).     [provider]    Family History Family History  Problem Relation Age of Onset  . CAD Mother   . Hypertension Mother   . Diabetes Mother   . Stroke Mother     Social History Social History   Tobacco Use  . Smoking status: Never Smoker  . Smokeless tobacco: Former NeurosurgeonUser    Types: Snuff  . Tobacco comment: quit 50-60 years ago  Substance Use Topics  . Alcohol use: No    Alcohol/week: 0.0 oz  . Drug use: No     Allergies   Lipitor [atorvastatin]   Review of Systems Review of Systems  Unable to perform ROS: Dementia     Physical Exam Updated Vital Signs BP (!) 105/35   Pulse 73   Temp 97.7 F (36.5 C) (Rectal)   Resp 13   Ht 5' 2.5" (1.588 m)   Wt 67.6 kg (149 lb)   SpO2 96%   BMI 26.82 kg/m   Physical Exam  Constitutional: She appears well-developed. No distress.  Elderly, frail  HENT:  Head: Normocephalic and atraumatic.  Eyes: Pupils are equal, round, and reactive to light. Conjunctivae and EOM are normal.  Neck: Normal range of motion and phonation normal. Neck supple.  Cardiovascular: Normal rate and  regular rhythm.  Pulmonary/Chest: Effort normal and breath sounds normal. She exhibits no tenderness.  Abdominal: Soft. She exhibits no distension. There is no tenderness. There is no guarding.  Musculoskeletal: Normal range of motion.  Neurological: She is alert.  Right facial droop.  Mild clonus right arm.  Does not follow commands for muscle strength testing.  Skin: Skin is warm and dry.  Nursing note and vitals reviewed.    ED Treatments / Results  Labs (all labs ordered are listed, but only abnormal results are displayed) Labs Reviewed  BASIC METABOLIC PANEL - Abnormal; Notable for the following components:      Result Value   Glucose, Bld 134 (*)    BUN 27 (*)    Creatinine, Ser 1.17 (*)    GFR calc non Af Amer 39 (*)    GFR calc Af Amer 45 (*)    All other components within normal limits  CBC - Abnormal; Notable for the following components:   RBC 3.75 (*)    Hemoglobin 10.7 (*)    HCT 34.8 (*)    RDW 17.7 (*)    All other components within normal limits  CBG MONITORING, ED - Abnormal; Notable for the following components:   Glucose-Capillary 126 (*)    All other components within normal limits  URINALYSIS, ROUTINE W REFLEX MICROSCOPIC  CBG MONITORING, ED  I-STAT CG4 LACTIC ACID, ED  I-STAT CG4 LACTIC ACID, ED    EKG EKG Interpretation  Date/Time:  Saturday December 03 2017 19:47:43 EDT Ventricular Rate:  81 PR Interval:    QRS Duration: 85 QT Interval:  394 QTC Calculation: 458 R Axis:   -3 Text Interpretation:  Sinus rhythm Prolonged PR interval Low voltage, precordial leads since last tracing no significant change Confirmed by Mancel BaleWentz, Vernecia Umble [phone]) on 12/03/2017 8:04:05 PM   Radiology No results found.  Procedures Procedures (including critical care time)  Medications Ordered in ED Medications - No data to display   Initial Impression / Assessment and Plan / ED Course  I have reviewed the triage vital signs and the  nursing notes.  Pertinent labs  & imaging results that were available during my care of the patient were reviewed by me and considered in my medical decision making (see chart for details).  Clinical Course as of Dec 03 2221  Sat Dec 03, 2017  2123 Normal  I-Stat CG4 Lactic Acid, ED [EW]  2123 Normal except glucose high 134, BUN high 27, creatinine high 1.2, BUN low 39.  Basic metabolic panel(!) [EW]  2123 Normal except hemoglobin low 10.7  CBC(!) [EW]  2222 Normal  I-Stat CG4 Lactic Acid, ED [EW]  2222 Normal  Urinalysis, Routine w reflex microscopic [EW]    Clinical Course User Index [EW] Mancel Bale, MD     Patient Vitals for the past 24 hrs:  BP Temp Temp src Pulse Resp SpO2 Height Weight  12/03/17 2115 (!) 105/35 - - 73 13 96 % - -  12/03/17 2030 (!) 123/45 - - 79 15 94 % - -  12/03/17 2015 (!) 101/35 - - 76 15 95 % - -  12/03/17 2000 (!) 99/35 - - 77 16 96 % - -  12/03/17 1958 - - - - - - 5' 2.5" (1.588 m) 67.6 kg (149 lb)  12/03/17 1957 (!) 106/46 97.7 F (36.5 C) Rectal 77 15 96 % - -  12/03/17 1947 - - - - - 98 % - -    10:23 PM Reevaluation with update and discussion. After initial assessment and treatment, an updated evaluation reveals patient some more alert at this time.  Patient's family members desire to have IV fluids given and chest x-ray obtained.  Initial evaluation consistent with mild dehydration, without signs of acute infectious processes. Mancel Bale   11:35 PM-currently the patient is taking applesauce well, and communicating with her daughter, by talking.  Her daughter feels that the patient is "100% better."  Findings discussed and daughter now agrees that the patient can be managed at home, with her usual care.  I encouraged the daughter to check the patient's temperature and oxygen saturation as a measure for evaluation.  I encouraged her to have a blood count checked, in 3 days, to evaluate her status.  MDM-elderly debilitated patient, with decreased oral intake for 1 day.   Possible mild dehydration with creatinine and BUN increased from baseline, 10 days ago.  Blood pressure is marginally lower than recent baseline 11/23/17. Doubt serious bacterial infection, metabolic instability or impending vascular collapse.  Nursing Notes Reviewed/ Care Coordinated Applicable Imaging Reviewed Interpretation of Laboratory Data incorporated into ED treatment  The patient appears reasonably screened and/or stabilized for discharge and I doubt any other medical condition or other [facility] requiring further screening, evaluation, or treatment in the ED at this time prior to discharge.  Plan: Home Medications-continue usual medications; Home Treatments-rest, fluids; return here if the recommended treatment, does not improve the symptoms; Recommended follow up-PCP follow-up 1 week and as needed       Final Clinical Impressions(s) / ED Diagnoses   Final diagnoses:  Malaise  Dehydration    ED Discharge Orders    None          Mancel Bale, MD 12/03/17 1610    Mancel Bale, MD 12/03/17 [phone]

## 2017-12-04 NOTE — ED Notes (Signed)
Assumed care on patient for discharge. Family upset at previous RN. Fluid bolus completed prior to discharge. IV removed, pt placed in t-shirt with new brief in place. Ptar at bedside to transport pt back home

## 2017-12-05 ENCOUNTER — Inpatient Hospital Stay (HOSPITAL_COMMUNITY)
Admission: EM | Admit: 2017-12-05 | Discharge: 2017-12-12 | DRG: 177 | Disposition: A | Discharge status: Home / Self Care | Payer: Medicare Other | Attending: Family Medicine | Admitting: Family Medicine

## 2017-12-05 ENCOUNTER — Encounter (HOSPITAL_COMMUNITY): Payer: Self-pay

## 2017-12-05 ENCOUNTER — Emergency Department (HOSPITAL_COMMUNITY): Payer: Medicare Other

## 2017-12-05 ENCOUNTER — Other Ambulatory Visit: Payer: Self-pay

## 2017-12-05 DIAGNOSIS — R4182 Altered mental status, unspecified: Secondary | ICD-10-CM | POA: Diagnosis not present

## 2017-12-05 DIAGNOSIS — G3183 Dementia with Lewy bodies: Secondary | ICD-10-CM | POA: Diagnosis present

## 2017-12-05 DIAGNOSIS — E43 Unspecified severe protein-calorie malnutrition: Secondary | ICD-10-CM | POA: Diagnosis present

## 2017-12-05 DIAGNOSIS — Z6825 Body mass index (BMI) 25.0-25.9, adult: Secondary | ICD-10-CM

## 2017-12-05 DIAGNOSIS — Z7982 Long term (current) use of aspirin: Secondary | ICD-10-CM

## 2017-12-05 DIAGNOSIS — Z7951 Long term (current) use of inhaled steroids: Secondary | ICD-10-CM

## 2017-12-05 DIAGNOSIS — D649 Anemia, unspecified: Secondary | ICD-10-CM | POA: Diagnosis present

## 2017-12-05 DIAGNOSIS — Z794 Long term (current) use of insulin: Secondary | ICD-10-CM

## 2017-12-05 DIAGNOSIS — J189 Pneumonia, unspecified organism: Secondary | ICD-10-CM | POA: Diagnosis not present

## 2017-12-05 DIAGNOSIS — L89150 Pressure ulcer of sacral region, unstageable: Secondary | ICD-10-CM | POA: Diagnosis present

## 2017-12-05 DIAGNOSIS — R402413 Glasgow coma scale score 13-15, at hospital admission: Secondary | ICD-10-CM | POA: Diagnosis present

## 2017-12-05 DIAGNOSIS — N179 Acute kidney failure, unspecified: Secondary | ICD-10-CM | POA: Diagnosis present

## 2017-12-05 DIAGNOSIS — Z66 Do not resuscitate: Secondary | ICD-10-CM | POA: Diagnosis present

## 2017-12-05 DIAGNOSIS — Z7189 Other specified counseling: Secondary | ICD-10-CM

## 2017-12-05 DIAGNOSIS — G40909 Epilepsy, unspecified, not intractable, without status epilepticus: Secondary | ICD-10-CM | POA: Diagnosis present

## 2017-12-05 DIAGNOSIS — Z1612 Extended spectrum beta lactamase (ESBL) resistance: Secondary | ICD-10-CM | POA: Diagnosis present

## 2017-12-05 DIAGNOSIS — Z888 Allergy status to other drugs, medicaments and biological substances status: Secondary | ICD-10-CM

## 2017-12-05 DIAGNOSIS — E1165 Type 2 diabetes mellitus with hyperglycemia: Secondary | ICD-10-CM | POA: Diagnosis present

## 2017-12-05 DIAGNOSIS — R1310 Dysphagia, unspecified: Secondary | ICD-10-CM | POA: Diagnosis not present

## 2017-12-05 DIAGNOSIS — Z515 Encounter for palliative care: Secondary | ICD-10-CM | POA: Diagnosis present

## 2017-12-05 DIAGNOSIS — Z993 Dependence on wheelchair: Secondary | ICD-10-CM | POA: Diagnosis not present

## 2017-12-05 DIAGNOSIS — Z7902 Long term (current) use of antithrombotics/antiplatelets: Secondary | ICD-10-CM | POA: Diagnosis not present

## 2017-12-05 DIAGNOSIS — Z720 Tobacco use: Secondary | ICD-10-CM

## 2017-12-05 DIAGNOSIS — Z7401 Bed confinement status: Secondary | ICD-10-CM

## 2017-12-05 DIAGNOSIS — G9341 Metabolic encephalopathy: Secondary | ICD-10-CM | POA: Diagnosis present

## 2017-12-05 DIAGNOSIS — R569 Unspecified convulsions: Secondary | ICD-10-CM | POA: Diagnosis not present

## 2017-12-05 DIAGNOSIS — J690 Pneumonitis due to inhalation of food and vomit: Secondary | ICD-10-CM | POA: Diagnosis present

## 2017-12-05 DIAGNOSIS — B961 Klebsiella pneumoniae [K. pneumoniae] as the cause of diseases classified elsewhere: Secondary | ICD-10-CM | POA: Diagnosis present

## 2017-12-05 DIAGNOSIS — Y95 Nosocomial condition: Secondary | ICD-10-CM | POA: Diagnosis present

## 2017-12-05 DIAGNOSIS — N390 Urinary tract infection, site not specified: Secondary | ICD-10-CM | POA: Diagnosis present

## 2017-12-05 DIAGNOSIS — F0280 Dementia in other diseases classified elsewhere without behavioral disturbance: Secondary | ICD-10-CM | POA: Diagnosis present

## 2017-12-05 DIAGNOSIS — Z79899 Other long term (current) drug therapy: Secondary | ICD-10-CM

## 2017-12-05 DIAGNOSIS — Z8673 Personal history of transient ischemic attack (TIA), and cerebral infarction without residual deficits: Secondary | ICD-10-CM

## 2017-12-05 DIAGNOSIS — E785 Hyperlipidemia, unspecified: Secondary | ICD-10-CM | POA: Diagnosis present

## 2017-12-05 DIAGNOSIS — N3000 Acute cystitis without hematuria: Secondary | ICD-10-CM | POA: Diagnosis not present

## 2017-12-05 DIAGNOSIS — M7989 Other specified soft tissue disorders: Secondary | ICD-10-CM | POA: Diagnosis not present

## 2017-12-05 DIAGNOSIS — IMO0002 Reserved for concepts with insufficient information to code with codable children: Secondary | ICD-10-CM | POA: Diagnosis present

## 2017-12-05 LAB — I-STAT CG4 LACTIC ACID, ED: LACTIC ACID, VENOUS: 1.11 mmol/L (ref 0.5–1.9)

## 2017-12-05 LAB — COMPREHENSIVE METABOLIC PANEL
ALBUMIN: 2.4 g/dL — AB (ref 3.5–5.0)
ALT: 8 U/L — ABNORMAL LOW (ref 14–54)
ANION GAP: 9 (ref 5–15)
AST: 16 U/L (ref 15–41)
Alkaline Phosphatase: 88 U/L (ref 38–126)
BILIRUBIN TOTAL: 0.3 mg/dL (ref 0.3–1.2)
BUN: 25 mg/dL — ABNORMAL HIGH (ref 6–20)
CALCIUM: 9.4 mg/dL (ref 8.9–10.3)
CO2: 24 mmol/L (ref 22–32)
Chloride: 109 mmol/L (ref 101–111)
Creatinine, Ser: 1.13 mg/dL — ABNORMAL HIGH (ref 0.44–1.00)
GFR calc non Af Amer: 40 mL/min — ABNORMAL LOW (ref >60)
GFR, EST AFRICAN AMERICAN: 47 mL/min — AB (ref >60)
GLUCOSE: 156 mg/dL — AB (ref 65–99)
Potassium: 4.2 mmol/L (ref 3.5–5.1)
SODIUM: 142 mmol/L (ref 135–145)
TOTAL PROTEIN: 7.1 g/dL (ref 6.5–8.1)

## 2017-12-05 LAB — URINALYSIS, COMPLETE (UACMP) WITH MICROSCOPIC
BILIRUBIN URINE: NEGATIVE
Glucose, UA: NEGATIVE mg/dL
Hgb urine dipstick: NEGATIVE
KETONES UR: NEGATIVE mg/dL
NITRITE: POSITIVE — AB
PH: 6 (ref 5.0–8.0)
PROTEIN: NEGATIVE mg/dL
SQUAMOUS EPITHELIAL / LPF: NONE SEEN
Specific Gravity, Urine: 1.016 (ref 1.005–1.030)

## 2017-12-05 LAB — CBC WITH DIFFERENTIAL/PLATELET
BASOS ABS: 0 10*3/uL (ref 0.0–0.1)
BASOS PCT: 0 %
Eosinophils Absolute: 0.1 10*3/uL (ref 0.0–0.7)
Eosinophils Relative: 1 %
HEMATOCRIT: 35 % — AB (ref 36.0–46.0)
HEMOGLOBIN: 10.7 g/dL — AB (ref 12.0–15.0)
LYMPHS PCT: 37 %
Lymphs Abs: 3.2 10*3/uL (ref 0.7–4.0)
MCH: 28.8 pg (ref 26.0–34.0)
MCHC: 30.6 g/dL (ref 30.0–36.0)
MCV: 94.1 fL (ref 78.0–100.0)
MONO ABS: 0.6 10*3/uL (ref 0.1–1.0)
Monocytes Relative: 7 %
NEUTROS ABS: 4.7 10*3/uL (ref 1.7–7.7)
NEUTROS PCT: 55 %
Platelets: 246 10*3/uL (ref 150–400)
RBC: 3.72 MIL/uL — AB (ref 3.87–5.11)
RDW: 17.4 % — AB (ref 11.5–15.5)
WBC: 8.6 10*3/uL (ref 4.0–10.5)

## 2017-12-05 LAB — AMMONIA: AMMONIA: 23 umol/L (ref 9–35)

## 2017-12-05 LAB — CBG MONITORING, ED: Glucose-Capillary: 147 mg/dL — ABNORMAL HIGH (ref 65–99)

## 2017-12-05 LAB — GLUCOSE, CAPILLARY: Glucose-Capillary: 80 mg/dL (ref 65–99)

## 2017-12-05 LAB — VALPROIC ACID LEVEL: VALPROIC ACID LVL: 25 ug/mL — AB (ref 50.0–100.0)

## 2017-12-05 MED ORDER — LOPERAMIDE HCL 2 MG PO CAPS
2.0000 mg | ORAL_CAPSULE | Freq: Four times a day (QID) | ORAL | Status: DC | PRN
  Filled 2017-12-05: qty 1

## 2017-12-05 MED ORDER — FERROUS SULFATE 325 (65 FE) MG PO TABS
325.0000 mg | ORAL_TABLET | Freq: Every day | ORAL | Status: DC
  Administered 2017-12-07 – 2017-12-12 (×6): 325 mg via ORAL
  Filled 2017-12-05 (×6): qty 1

## 2017-12-05 MED ORDER — CLOPIDOGREL BISULFATE 75 MG PO TABS
75.0000 mg | ORAL_TABLET | Freq: Every day | ORAL | Status: DC
  Administered 2017-12-07 – 2017-12-12 (×6): 75 mg via ORAL
  Filled 2017-12-05 (×6): qty 1

## 2017-12-05 MED ORDER — ROSUVASTATIN CALCIUM 5 MG PO TABS
5.0000 mg | ORAL_TABLET | ORAL | Status: DC
  Administered 2017-12-07 – 2017-12-12 (×3): 5 mg via ORAL
  Filled 2017-12-05 (×5): qty 1

## 2017-12-05 MED ORDER — VITAMIN C 500 MG PO TABS
500.0000 mg | ORAL_TABLET | Freq: Every day | ORAL | Status: DC
  Administered 2017-12-07 – 2017-12-12 (×6): 500 mg via ORAL
  Filled 2017-12-05 (×6): qty 1

## 2017-12-05 MED ORDER — ALBUTEROL SULFATE (2.5 MG/3ML) 0.083% IN NEBU
2.5000 mg | INHALATION_SOLUTION | Freq: Three times a day (TID) | RESPIRATORY_TRACT | Status: DC
  Administered 2017-12-05: 2.5 mg via RESPIRATORY_TRACT
  Filled 2017-12-05: qty 0.5
  Filled 2017-12-05: qty 3

## 2017-12-05 MED ORDER — INSULIN ASPART 100 UNIT/ML ~~LOC~~ SOLN
0.0000 [IU] | Freq: Every day | SUBCUTANEOUS | Status: DC
  Administered 2017-12-11: 2 [IU] via SUBCUTANEOUS

## 2017-12-05 MED ORDER — PRO-STAT SUGAR FREE PO LIQD: 30.0000 mL | Freq: Three times a day (TID) | ORAL | Status: DC

## 2017-12-05 MED ORDER — ADULT MULTIVITAMIN W/MINERALS CH
1.0000 | ORAL_TABLET | Freq: Every day | ORAL | Status: DC
  Administered 2017-12-07 – 2017-12-12 (×6): 1 via ORAL
  Filled 2017-12-05 (×6): qty 1

## 2017-12-05 MED ORDER — SODIUM CHLORIDE 0.9 % IV SOLN
1.0000 g | INTRAVENOUS | Status: DC
  Administered 2017-12-06: 1 g via INTRAVENOUS
  Filled 2017-12-05 (×2): qty 1

## 2017-12-05 MED ORDER — ONDANSETRON 4 MG PO TBDP: 4.0000 mg | ORAL_TABLET | Freq: Three times a day (TID) | ORAL | Status: DC | PRN

## 2017-12-05 MED ORDER — SODIUM CHLORIDE 0.9 % IV SOLN: INTRAVENOUS | Status: DC

## 2017-12-05 MED ORDER — ENOXAPARIN SODIUM 30 MG/0.3ML ~~LOC~~ SOLN
30.0000 mg | SUBCUTANEOUS | Status: DC
  Administered 2017-12-06: 30 mg via SUBCUTANEOUS
  Filled 2017-12-05: qty 0.3

## 2017-12-05 MED ORDER — FAMOTIDINE 20 MG PO TABS
20.0000 mg | ORAL_TABLET | Freq: Every day | ORAL | Status: DC
  Administered 2017-12-07 – 2017-12-12 (×6): 20 mg via ORAL
  Filled 2017-12-05 (×6): qty 1

## 2017-12-05 MED ORDER — BACID PO TABS
2.0000 | ORAL_TABLET | Freq: Three times a day (TID) | ORAL | Status: DC
  Filled 2017-12-05: qty 2

## 2017-12-05 MED ORDER — SODIUM CHLORIDE 0.9% FLUSH: 3.0000 mL | INTRAVENOUS | Status: DC | PRN

## 2017-12-05 MED ORDER — SODIUM CHLORIDE 0.9 % IV SOLN
3.0000 g | Freq: Once | INTRAVENOUS | Status: AC
  Administered 2017-12-05: 3 g via INTRAVENOUS
  Filled 2017-12-05: qty 3

## 2017-12-05 MED ORDER — SODIUM CHLORIDE 0.9 % IV BOLUS
500.0000 mL | Freq: Once | INTRAVENOUS | Status: AC
  Administered 2017-12-05: 500 mL via INTRAVENOUS

## 2017-12-05 MED ORDER — VANCOMYCIN HCL 10 G IV SOLR
1500.0000 mg | INTRAVENOUS | Status: AC
  Administered 2017-12-05: 1500 mg via INTRAVENOUS
  Filled 2017-12-05: qty 1500

## 2017-12-05 MED ORDER — SODIUM CHLORIDE 0.9 % IV SOLN: 250.0000 mL | INTRAVENOUS | Status: DC | PRN

## 2017-12-05 MED ORDER — RISAQUAD PO CAPS
2.0000 | ORAL_CAPSULE | Freq: Three times a day (TID) | ORAL | Status: DC
Start: 2017-12-06 — End: 2017-12-12
  Administered 2017-12-07 – 2017-12-12 (×16): 2 via ORAL
  Filled 2017-12-05 (×15): qty 2

## 2017-12-05 MED ORDER — DEXTROSE-NACL 5-0.9 % IV SOLN
INTRAVENOUS | Status: DC
  Administered 2017-12-06: via INTRAVENOUS

## 2017-12-05 MED ORDER — PREMIER PROTEIN SHAKE
2.0000 [oz_av] | Freq: Three times a day (TID) | ORAL | Status: DC
  Filled 2017-12-05 (×2): qty 325.31

## 2017-12-05 MED ORDER — MELATONIN 3 MG PO TABS
3.0000 mg | ORAL_TABLET | Freq: Every evening | ORAL | Status: DC | PRN
  Filled 2017-12-05: qty 1

## 2017-12-05 MED ORDER — GUAIFENESIN 100 MG/5ML PO SOLN
10.0000 mL | Freq: Four times a day (QID) | ORAL | Status: DC
  Administered 2017-12-07 – 2017-12-12 (×18): 200 mg via ORAL
  Filled 2017-12-05 (×10): qty 10
  Filled 2017-12-05: qty 20
  Filled 2017-12-05 (×5): qty 10

## 2017-12-05 MED ORDER — MEMANTINE HCL ER 14 MG PO CP24
14.0000 mg | ORAL_CAPSULE | Freq: Every day | ORAL | Status: DC
  Administered 2017-12-07 – 2017-12-11 (×5): 14 mg via ORAL
  Filled 2017-12-05 (×7): qty 1

## 2017-12-05 MED ORDER — INSULIN ASPART 100 UNIT/ML ~~LOC~~ SOLN
0.0000 [IU] | Freq: Three times a day (TID) | SUBCUTANEOUS | Status: DC
  Administered 2017-12-06 (×2): 1 [IU] via SUBCUTANEOUS
  Administered 2017-12-07 – 2017-12-08 (×2): 2 [IU] via SUBCUTANEOUS
  Administered 2017-12-08: 1 [IU] via SUBCUTANEOUS
  Administered 2017-12-09 (×2): 2 [IU] via SUBCUTANEOUS
  Administered 2017-12-10: 1 [IU] via SUBCUTANEOUS
  Administered 2017-12-10: 3 [IU] via SUBCUTANEOUS
  Administered 2017-12-10: 1 [IU] via SUBCUTANEOUS
  Administered 2017-12-11 (×2): 2 [IU] via SUBCUTANEOUS
  Administered 2017-12-11 – 2017-12-12 (×2): 1 [IU] via SUBCUTANEOUS
  Administered 2017-12-12: 2 [IU] via SUBCUTANEOUS

## 2017-12-05 MED ORDER — NYSTATIN 100000 UNIT/GM EX POWD
Freq: Two times a day (BID) | CUTANEOUS | Status: DC | PRN
  Filled 2017-12-05: qty 15

## 2017-12-05 MED ORDER — ASPIRIN EC 81 MG PO TBEC
81.0000 mg | DELAYED_RELEASE_TABLET | Freq: Every day | ORAL | Status: DC
  Administered 2017-12-07: 81 mg via ORAL
  Filled 2017-12-05: qty 1

## 2017-12-05 MED ORDER — SODIUM CHLORIDE 0.9% FLUSH
3.0000 mL | Freq: Two times a day (BID) | INTRAVENOUS | Status: DC
  Administered 2017-12-07 – 2017-12-12 (×10): 3 mL via INTRAVENOUS

## 2017-12-05 MED ORDER — VANCOMYCIN HCL IN DEXTROSE 1-5 GM/200ML-% IV SOLN: 1000.0000 mg | INTRAVENOUS | Status: DC

## 2017-12-05 MED ORDER — BUDESONIDE 0.25 MG/2ML IN SUSP
0.2500 mg | Freq: Two times a day (BID) | RESPIRATORY_TRACT | Status: DC
  Administered 2017-12-05 – 2017-12-12 (×14): 0.25 mg via RESPIRATORY_TRACT
  Filled 2017-12-05 (×15): qty 2

## 2017-12-05 MED ORDER — SODIUM CHLORIDE 0.9 % IV BOLUS
500.0000 mL | Freq: Once | INTRAVENOUS | Status: AC
Start: 2017-12-05 — End: 2017-12-05
  Administered 2017-12-05: 500 mL via INTRAVENOUS

## 2017-12-05 MED ORDER — VALPROATE SODIUM 250 MG/5ML PO SOLN
250.0000 mg | Freq: Two times a day (BID) | ORAL | Status: DC
  Filled 2017-12-05 (×2): qty 5

## 2017-12-05 NOTE — ED Notes (Signed)
[facility] AT BEDSIDE ATTEMPTING 2ND BLOOD CULTURE

## 2017-12-05 NOTE — Progress Notes (Signed)
Pharmacy Antibiotic Note  Megan [name] is a 84 y.o. female admitted on 12/05/2017 with altered mental status due to HCAP.  PMH significant for seizures, CVA, diabetes.  Patient has received Unasyn 3gm IV x 1 in the ED @ 19:44.  Pharmacy has been consulted for Vancomycin dosing.  Plan: - Vancomycin 1500mg  IV x 1 loading dose followed by 1000mg  IV q48h (Goal AUC 400-500) - MD has ordered Cefepime 1gm IV q8h; H&P note states pharmacy to dose.  Based on patient's renal function, will adjust Cefepime to 1gm IV q24h - Follow culture results/sensitivities, renal function - Check vancomycin levels as appropriate  Height: 5' 2.25" (158.1 cm) Weight: 150 lb (68 kg) IBW/kg (Calculated) : 50.68  Temp (24hrs), Avg:98.3 F (36.8 C), Min:98.3 F (36.8 C), Max:98.3 F (36.8 C)  Recent Labs  Lab 12/03/17 2007 12/03/17 2040 12/03/17 2232 12/05/17 1553 12/05/17 1559  WBC 10.0  --   --  8.6  --   CREATININE 1.17*  --   --  1.13*  --   LATICACIDVEN  --  1.25 1.20  --  1.11    Estimated Creatinine Clearance: 27.7 mL/min (A) (by C-G formula based on SCr of 1.13 mg/dL (H)).    Allergies  Allergen Reactions  . Lipitor [Atorvastatin] Other (See Comments)    Myalgias     Antimicrobials this admission: 4/15 Unasyn x 1 dose  4/15 Vanc >>   4/15 Cefepime >>  Dose adjustments this admission:    Microbiology results: 4/15 BCx: sent 4/15 UCx: sent   Thank you for allowing pharmacy to be a part of this patient's care.  Maryellen PilePoindexter, Cinch Ormond Trefz, PharmD 12/05/2017 8:35 PM

## 2017-12-05 NOTE — ED Triage Notes (Signed)
Per [facility]- Pt resides at home. Family called for altered mental status. Per EMS- open eyes spontaneously and crys out in pain. Family states altered event occurred at 12 noon. 911 called at 1400. Family continues to state pt is not at baseline. Pt deficits are at baseline per family.

## 2017-12-05 NOTE — ED Notes (Addendum)
ED Provider at bedside. EDP [place] SPEAKING TO FAMILY ABOUT ADMISSION AND PT CURRENT STATUS

## 2017-12-05 NOTE — ED Notes (Addendum)
Patient transported to CT 

## 2017-12-05 NOTE — ED Notes (Addendum)
MAIN LAB CALLED SPOKE WITH [facility] FOR ASSISTANCE WITH COLLECTION OF 2ND BLOOD CULTURE.

## 2017-12-05 NOTE — ED Notes (Signed)
BLOOD CULTURE X 1 OBTAINED LEFT AC 

## 2017-12-05 NOTE — ED Provider Notes (Signed)
[facility]-EMERGENCY DEPT Provider Note   CSN: [id number] Arrival date & time: 12/05/17  1420   LEVEL 5 CAVEAT - ALTERED MENTAL STATUS  History   Chief Complaint Chief Complaint  Patient presents with  . Altered Mental Status    HPI Omar PersonRuby Hollenbach is a 84 y.o. female.  HPI  84 year old female with a history of diabetes, stroke, frequent admissions for altered mental status and dementia presents with worsening mental status.  She was seen in the emergency department 2 days ago and was having decreased p.o. intake and improved with IV fluids and was discharged home.  She has been eating and drinking a little bit but not drinking as much as typical since then.  Today, around noon which was about 30 minutes after last eating, she all of a sudden developed coughing.  She has a history of frequent aspiration pneumonia.  She has been having trouble with coughing since and was given a Xopenex treatment.  However, she has had depressed mental status since.  She does not have her eyes open like she typically would.  She typically communicates by writing or at least from writing down from the daughters.  However now she is not opening her eyes much and occasionally moaning.  No known fevers today.  No vomiting. No recent change in medicines.  Past Medical History:  Diagnosis Date  . Dementia   . Diabetes mellitus   . Hyperlipidemia   . Pneumonia 10/2017  . Seizure (HCC)   . Sepsis (HCC) 11/24/2017  . Stroke [facility](HCC) 2012   possible stroke Jan 2016  . UTI (urinary tract infection) 10/2017    Patient Active Problem List   Diagnosis Date Noted  . Palliative care encounter   . Sepsis (HCC) 11/11/2017  . Aspiration pneumonia due to gastric secretions (HCC)   . Encounter for hospice care discussion   . Palliative care by specialist   . Increased ammonia level 11/01/2017  . Acute metabolic encephalopathy 11/01/2017  . HCAP (healthcare-associated pneumonia) 11/01/2017  .  Pressure injury of skin 10/26/2017  . UTI (urinary tract infection) 10/26/2017  . Heel ulcer (HCC) 06/18/2017  . Unstageable pressure ulcer of sacral region (HCC) 06/18/2017  . Decubitus ulcer of sacral region, stage 2   . Idiopathic hypotension   . Seizure (HCC) 05/21/2017  . Hyponatremia 05/12/2017  . Acute renal failure superimposed on stage 3 chronic kidney disease (HCC) 10/23/2014  . Dehydration 10/23/2014  . Acute hyperkalemia 10/23/2014  . Abnormal urinalysis 10/23/2014  . Chest pain 04/24/2014  . Altered mental state 08/08/2013  . Dementia with Lewy bodies 08/07/2013  . Vascular dementia 08/07/2013  . Sleepiness 08/07/2013  . Anemia 07/21/2013  . Protein-calorie malnutrition, severe (HCC) 07/15/2013  . Herpes zoster 07/13/2013  . Spastic hemiplegia affecting dominant side (HCC) 06/25/2013  . History of CVA w/residual right side deficits 09/03/2012  . History of partial seizures   . TIA (transient ischemic attack) 01/06/2012  . Right upper lobe pneumonia (HCC) 01/06/2012  . Diabetes mellitus type II, uncontrolled (HCC) 01/06/2012  . HTN (hypertension) 01/06/2012  . H/O 01/06/2012    Past Surgical History:  Procedure Laterality Date  . CATARACT EXTRACTION       OB History   None      Home Medications    Prior to Admission medications   Medication Sig Start Date End Date Taking? Authorizing Provider  acetaminophen (TYLENOL) 325 MG tablet Take 2 tablets (650 mg total) by mouth every 6 (six) hours  as needed for mild pain (or Fever >/= 101). 11/04/17  Yes Alwyn Ren, MD  Amino Acids-Protein Hydrolys (FEEDING SUPPLEMENT, PRO-STAT SUGAR FREE 64,) LIQD Take 30 mLs by mouth 4 (four) times daily -  with meals and at bedtime. 10/28/17  Yes Narda Bonds, MD  aspirin EC 81 MG tablet Take 1 tablet (81 mg total) by mouth daily. 11/17/17 11/17/18 Yes Regalado, Belkys A, MD  budesonide (PULMICORT) 0.25 MG/2ML nebulizer solution Take 2 mLs (0.25 mg total) by nebulization  2 (two) times daily. 11/17/17  Yes Regalado, Belkys A, MD  clopidogrel (PLAVIX) 75 MG tablet Take 1 tablet (75 mg total) by mouth daily with breakfast. 08/15/13  Yes [name], MD  famotidine (PEPCID) 20 MG tablet Take 1 tablet (20 mg total) by mouth daily. 10/29/17  Yes Narda Bonds, MD  ferrous sulfate 325 (65 FE) MG tablet Take 325 mg by mouth daily with breakfast.   Yes [provider]  guaiFENesin (ROBITUSSIN) 100 MG/5ML SOLN Take 10 mLs (200 mg total) by mouth 4 (four) times daily. 11/17/17  Yes Regalado, Belkys A, MD  ibuprofen (ADVIL,MOTRIN) 100 MG/5ML suspension Take 20 mLs (400 mg total) by mouth every 6 (six) hours as needed (prn lef arm pain). 11/17/17  Yes Regalado, Belkys A, MD  Insulin Glargine (TOUJEO MAX SOLOSTAR) 300 UNIT/ML SOPN Inject 5-9 Units into the skin at bedtime.   Yes [provider]  lactobacillus acidophilus (BACID) TABS tablet Take 2 tablets by mouth 3 (three) times daily. 11/17/17  Yes Regalado, Belkys A, MD  levalbuterol (XOPENEX) 0.63 MG/3ML nebulizer solution Take 3 mLs (0.63 mg total) by nebulization 3 (three) times daily. 11/17/17  Yes Regalado, Belkys A, MD  loperamide (IMODIUM A-D) 2 MG tablet Take 1 tablet (2 mg total) by mouth 4 (four) times daily as needed for diarrhea or loose stools. 11/17/17  Yes Regalado, Belkys A, MD  Melatonin 3 MG TABS Take 1 tablet (3 mg total) by mouth at bedtime as needed (sleep). 11/17/17  Yes Regalado, Belkys A, MD  memantine (NAMENDA XR) 14 MG CP24 24 hr capsule Take 14 mg by mouth at bedtime.    Yes [provider]  Multiple Vitamin (MULTIVITAMIN WITH MINERALS) TABS tablet Take 1 tablet by mouth daily. 11/18/17  Yes Regalado, Belkys A, MD  NYSTATIN powder Apply 1 application topically 2 (two) times daily as needed for rash. 05/04/17  Yes [provider]  ondansetron (ZOFRAN ODT) 4 MG disintegrating tablet Take 1 tablet (4 mg total) by mouth every 8 (eight) hours as needed. 11/23/17  Yes Mabe, Latanya Maudlin, MD  protein supplement shake (PREMIER PROTEIN) LIQD Take 59.1 mLs (2 oz total) by mouth 4 (four) times daily -  with meals and at bedtime. 10/28/17  Yes Narda Bonds, MD  rosuvastatin (CRESTOR) 5 MG tablet Take 5 mg by mouth every Monday, Wednesday, and Friday. At bedtime   Yes [provider]  Valproate Sodium (DEPAKENE) 250 MG/5ML SOLN solution Take 5 mLs (250 mg total) by mouth 2 (two) times daily. 11/17/17  Yes Regalado, Belkys A, MD  vitamin C (ASCORBIC ACID) 500 MG tablet Take 500 mg by mouth daily.   Yes [provider]  zinc oxide (BALMEX) 11.3 % CREA cream Apply 1 application topically daily as needed (for rash).    Yes [provider]  diphenoxylate-atropine (LOMOTIL) 2.5-0.025 MG/5ML liquid Take 5 mLs by mouth 4 (four) times daily as needed for diarrhea or loose stools. Patient not taking:  Reported on 12/05/2017 11/17/17   Regalado, Jon Billings A, MD  insulin aspart (NOVOLOG) 100 UNIT/ML injection Inject 2 Units into the skin 3 (three) times daily with meals. Please inject 2 units if patient eats more than 50 % meals and if Blood sugar above 160 Patient not taking: Reported on 12/05/2017 11/17/17   Regalado, Jon Billings A, MD  metroNIDAZOLE (FLAGYL) 50 mg/ml oral suspension Take 10 mLs (500 mg total) by mouth 3 (three) times daily. Patient not taking: Reported on 11/23/2017 11/17/17   Regalado, Jon Billings A, MD  ondansetron (ZOFRAN) 4 MG tablet Take 1 tablet (4 mg total) by mouth every 6 (six) hours as needed for nausea. Patient not taking: Reported on 12/05/2017 11/17/17   Alba Cory, MD    Family History Family History  Problem Relation Age of Onset  . CAD Mother   . Hypertension Mother   . Diabetes Mother   . Stroke Mother     Social History Social History   Tobacco Use  . Smoking status: Never Smoker  . Smokeless tobacco: Former Neurosurgeon    Types: Snuff  . Tobacco comment: quit 50-60 years ago  Substance Use Topics  . Alcohol use: No    Alcohol/week: 0.0  oz  . Drug use: No     Allergies   Lipitor [atorvastatin]   Review of Systems Review of Systems  Unable to perform ROS: Mental status change     Physical Exam Updated Vital Signs Ht 5' 2.25" (1.581 m)   Wt 68 kg (150 lb)   SpO2 96%   BMI 27.22 kg/m   Physical Exam  Constitutional: She appears well-developed and well-nourished. She appears lethargic.  HENT:  Head: Normocephalic and atraumatic.  Right Ear: External ear normal.  Left Ear: External ear normal.  Nose: Nose normal.  Eyes: Pupils are equal, round, and reactive to light. Right eye exhibits no discharge. Left eye exhibits no discharge.  Miotic pupils but reactive  Cardiovascular: Normal rate, regular rhythm and normal heart sounds.  Pulmonary/Chest: Effort normal and breath sounds normal. She has no wheezes.  Abdominal: Soft. She exhibits no distension. There is no tenderness.  Musculoskeletal:  Lower legs in braces  Neurological: She appears lethargic.  Patient has eyes closed. When stimulated she will briefly open eyes but not focus them. Unable to do strength testing given lack of participation  Skin: Skin is warm and dry. She is not diaphoretic.  Nursing note and vitals reviewed.    ED Treatments / Results  Labs (all labs ordered are listed, but only abnormal results are displayed) Labs Reviewed  COMPREHENSIVE METABOLIC PANEL - Abnormal; Notable for the following components:      Result Value   Glucose, Bld 156 (*)    BUN 25 (*)    Creatinine, Ser 1.13 (*)    Albumin 2.4 (*)    ALT 8 (*)    GFR calc non Af Amer 40 (*)    GFR calc Af Amer 47 (*)    All other components within normal limits  CBC WITH DIFFERENTIAL/PLATELET - Abnormal; Notable for the following components:   RBC 3.72 (*)    Hemoglobin 10.7 (*)    HCT 35.0 (*)    RDW 17.4 (*)    All other components within normal limits  URINALYSIS, COMPLETE (UACMP) WITH MICROSCOPIC - Abnormal; Notable for the following components:                     VALPROIC ACID LEVEL -  Abnormal; Notable for the following components:   Valproic Acid Lvl 25 (*)    All other components within normal limits  CBG MONITORING, ED - Abnormal; Notable for the following components:   Glucose-Capillary 147 (*)    All other components within normal limits  URINE CULTURE  CULTURE, BLOOD (ROUTINE X 2)  CULTURE, BLOOD (ROUTINE X 2)  CULTURE, EXPECTORATED SPUTUM-ASSESSMENT  GRAM STAIN  AMMONIA  GLUCOSE, CAPILLARY  CBC  COMPREHENSIVE METABOLIC PANEL  HIV ANTIBODY (ROUTINE TESTING)  STREP PNEUMONIAE URINARY ANTIGEN  INFLUENZA PANEL BY PCR (TYPE A & B)  LEGIONELLA PNEUMOPHILA SEROGP 1 UR AG  I-STAT CG4 LACTIC ACID, ED    EKG None  Radiology Dg Chest 1 View  Result Date: 12/03/2017 CLINICAL DATA:  Decreased appetite. EXAM: CHEST  1 VIEW COMPARISON:  11/23/2017 FINDINGS: Persistent low lung volumes. Small pleural effusions have decreased from prior exam. Persistent patchy bibasilar opacities. Unchanged heart size and mediastinal contours allowing for differences in technique. No pneumothorax or new focal airspace disease. IMPRESSION: Low lung volumes with persistent bibasilar opacities, may be pneumonia or aspiration. Diminished bilateral pleural effusions from prior. Electronically Signed   By: Rubye Oaks M.D.   On: 12/03/2017 23:04    Procedures Procedures (including critical care time)  Medications Ordered in ED Medications  sodium chloride 0.9 % bolus 500 mL (has no administration in time range)     Initial Impression / Assessment and Plan / ED Course  I have reviewed the triage vital signs and the nursing notes.  Pertinent labs & imaging results that were available during my care of the patient were reviewed by me and considered in my medical decision making (see chart for details).     Urinalysis currently pending.  Patient was treated for possible aspiration pneumonia given the new changes in the right upper lobe along with new  cough and prior history.  She is hemodynamically stable but given her abnormal mental state, it will be hard for her to tolerate oral medicines and thus I think she will need to be observed in the hospital for further monitoring and care.  Given a dose of Unasyn.  Hospitalist to admit.  Final Clinical Impressions(s) / ED Diagnoses   Final diagnoses:  Altered mental status, unspecified altered mental status type  Aspiration pneumonia of right upper lobe, unspecified aspiration pneumonia type [facility])    ED Discharge Orders    None       Pricilla Loveless, MD 12/06/17 0040

## 2017-12-05 NOTE — H&P (Signed)
TRH H&P   Patient Demographics:    Megan [name], is a 84 y.o. female  MRN: [id number]   DOB - 10-May-1922  Admit Date - 12/05/2017  Outpatient Primary MD for the patient is Lavone Orn, MD  Referring MD/NP/PA: Sherwood Gambler  Outpatient Specialists:   Patient coming from: home  Chief Complaint  Patient presents with  . Altered Mental Status      HPI:    Megan [name]  is a 84 y.o. female, w Dm2, CVA, seizure do, Dementia apparently c/o altered mental status according to her family , more somnolent, and then had coughing spell at about 11:30am,  Pt was brought to ER for coughing.  Pt has been afebrile. No respiratory distress.     In ED,  Wbc 8.6, Hgb 10.7, Plt 246   Na 142, K 4.2, Bun 25, Creatinine 1.13 Ast 16, Alt 8 Alk phos 88, T bili 0.3 Ammonia 23 Valproic acid 25  Urinalysis pending  CT brain IMPRESSION: 1. No acute intracranial abnormality. 2. Stable advanced chronic microvascular ischemic white matter disease and cortical atrophy. CXR  IMPRESSION: Chronic changes in the bases.  Question early changes in the right upper lobe.  Pt will be admitted for AMS secondary to Hcap   Review of systems:    In addition to the HPI above, No Fever-chills, No Headache, No changes with Vision or hearing, No problems swallowing food or Liquids, No Chest pain, No Abdominal pain, No Nausea or Vommitting, Bowel movements are regular, No Blood in stool or Urine, No dysuria, No new skin rashes or bruises, No new joints pains-aches,  No new weakness, tingling, numbness in any extremity, No recent weight gain or loss, No polyuria, polydypsia or polyphagia, No significant Mental Stressors.  A full 10 point Review of Systems was done, except as stated above, all other Review of Systems were negative.   With Past History of the following :    Past Medical History:    Diagnosis Date  . Dementia   . Diabetes mellitus   . Hyperlipidemia   . Pneumonia 10/2017  . Seizure ([place])   . Sepsis ([place]) 11/24/2017  . Stroke [facility]) 2012   possible stroke Jan 2016  . UTI (urinary tract infection) 10/2017      Past Surgical History:  Procedure Laterality Date  . CATARACT EXTRACTION        Social History:     Social History   Tobacco Use  . Smoking status: Never Smoker  . Smokeless tobacco: Former Systems developer    Types: Snuff  . Tobacco comment: quit 50-60 years ago  Substance Use Topics  . Alcohol use: No    Alcohol/week: 0.0 oz     Lives - at home  Mobility - unclear    Family History :     Family History  Problem Relation Age of Onset  . CAD Mother   .  Hypertension Mother   . Diabetes Mother   . Stroke Mother        Home Medications:   Prior to Admission medications   Medication Sig Start Date End Date Taking? Authorizing Provider  acetaminophen (TYLENOL) 325 MG tablet Take 2 tablets (650 mg total) by mouth every 6 (six) hours as needed for mild pain (or Fever >/= 101). 11/04/17  Yes Georgette Shell, MD  Amino Acids-Protein Hydrolys (FEEDING SUPPLEMENT, PRO-STAT SUGAR FREE 64,) LIQD Take 30 mLs by mouth 4 (four) times daily -  with meals and at bedtime. 10/28/17  Yes Mariel Aloe, MD  aspirin EC 81 MG tablet Take 1 tablet (81 mg total) by mouth daily. 11/17/17 11/17/18 Yes Regalado, Belkys A, MD  budesonide (PULMICORT) 0.25 MG/2ML nebulizer solution Take 2 mLs (0.25 mg total) by nebulization 2 (two) times daily. 11/17/17  Yes Regalado, Belkys A, MD  clopidogrel (PLAVIX) 75 MG tablet Take 1 tablet (75 mg total) by mouth daily with breakfast. 08/15/13  Yes Lavone Orn, MD  famotidine (PEPCID) 20 MG tablet Take 1 tablet (20 mg total) by mouth daily. 10/29/17  Yes Mariel Aloe, MD  ferrous sulfate 325 (65 FE) MG tablet Take 325 mg by mouth daily with breakfast.   Yes [provider]  guaiFENesin (ROBITUSSIN) 100 MG/5ML SOLN Take 10  mLs (200 mg total) by mouth 4 (four) times daily. 11/17/17  Yes Regalado, Belkys A, MD  ibuprofen (ADVIL,MOTRIN) 100 MG/5ML suspension Take 20 mLs (400 mg total) by mouth every 6 (six) hours as needed (prn lef arm pain). 11/17/17  Yes Regalado, Belkys A, MD  Insulin Glargine (TOUJEO MAX SOLOSTAR) 300 UNIT/ML SOPN Inject 5-9 Units into the skin at bedtime.   Yes [provider]  lactobacillus acidophilus (BACID) TABS tablet Take 2 tablets by mouth 3 (three) times daily. 11/17/17  Yes Regalado, Belkys A, MD  levalbuterol (XOPENEX) 0.63 MG/3ML nebulizer solution Take 3 mLs (0.63 mg total) by nebulization 3 (three) times daily. 11/17/17  Yes Regalado, Belkys A, MD  loperamide (IMODIUM A-D) 2 MG tablet Take 1 tablet (2 mg total) by mouth 4 (four) times daily as needed for diarrhea or loose stools. 11/17/17  Yes Regalado, Belkys A, MD  Melatonin 3 MG TABS Take 1 tablet (3 mg total) by mouth at bedtime as needed (sleep). 11/17/17  Yes Regalado, Belkys A, MD  memantine (NAMENDA XR) 14 MG CP24 24 hr capsule Take 14 mg by mouth at bedtime.    Yes [provider]  Multiple Vitamin (MULTIVITAMIN WITH MINERALS) TABS tablet Take 1 tablet by mouth daily. 11/18/17  Yes Regalado, Belkys A, MD  NYSTATIN powder Apply 1 application topically 2 (two) times daily as needed for rash. 05/04/17  Yes [provider]  ondansetron (ZOFRAN ODT) 4 MG disintegrating tablet Take 1 tablet (4 mg total) by mouth every 8 (eight) hours as needed. 11/23/17  Yes Mabe, Forbes Cellar, MD  protein supplement shake (PREMIER PROTEIN) LIQD Take 59.1 mLs (2 oz total) by mouth 4 (four) times daily -  with meals and at bedtime. 10/28/17  Yes Mariel Aloe, MD  rosuvastatin (CRESTOR) 5 MG tablet Take 5 mg by mouth every Monday, Wednesday, and Friday. At bedtime   Yes [provider]  Valproate Sodium (DEPAKENE) 250 MG/5ML SOLN solution Take 5 mLs (250 mg total) by mouth 2 (two) times daily. 11/17/17  Yes Regalado, Belkys A, MD    vitamin C (ASCORBIC ACID) 500 MG tablet Take 500 mg by  mouth daily.   Yes [provider]  zinc oxide (BALMEX) 11.3 % CREA cream Apply 1 application topically daily as needed (for rash).    Yes [provider]  diphenoxylate-atropine (LOMOTIL) 2.5-0.025 MG/5ML liquid Take 5 mLs by mouth 4 (four) times daily as needed for diarrhea or loose stools. Patient not taking: Reported on 12/05/2017 11/17/17   Regalado, Jerald Kief A, MD  insulin aspart (NOVOLOG) 100 UNIT/ML injection Inject 2 Units into the skin 3 (three) times daily with meals. Please inject 2 units if patient eats more than 50 % meals and if Blood sugar above 160 Patient not taking: Reported on 12/05/2017 11/17/17   Regalado, Jerald Kief A, MD  metroNIDAZOLE (FLAGYL) 50 mg/ml oral suspension Take 10 mLs (500 mg total) by mouth 3 (three) times daily. Patient not taking: Reported on 11/23/2017 11/17/17   Regalado, Jerald Kief A, MD  ondansetron (ZOFRAN) 4 MG tablet Take 1 tablet (4 mg total) by mouth every 6 (six) hours as needed for nausea. Patient not taking: Reported on 12/05/2017 11/17/17   Elmarie Shiley, MD     Allergies:     Allergies  Allergen Reactions  . Lipitor [Atorvastatin] Other (See Comments)    Myalgias      Physical Exam:   Vitals  Blood pressure (!) 112/47, pulse 72, temperature 98.3 F (36.8 C), temperature source Rectal, resp. rate 17, height 5' 2.25" (1.581 m), weight 68 kg (150 lb), SpO2 99 %.   1. General  lying in bed in NAD,    2. Normal affect and insight, Not Suicidal or Homicidal, Awake Alert, Oriented X 1.  3. No F.N deficits, ALL C.Nerves Intact, Strength 5/5 all 4 extremities, Sensation intact all 4 extremities, Plantars down going.  4. Ears and Eyes appear Normal, Conjunctivae clear, PERRLA. Moist Oral Mucosa.  5. Supple Neck, No JVD, No cervical lymphadenopathy appriciated, No Carotid Bruits.  6. Symmetrical Chest wall movement, Good air movement bilaterally, slight crackle bilateral  base, no wheezing  7. RRR, No Gallops, Rubs or Murmurs, No Parasternal Heave.  8. Positive Bowel Sounds, Abdomen Soft, No tenderness, No organomegaly appriciated,No rebound -guarding or rigidity.  9.  No Cyanosis, Normal Skin Turgor, No Skin Rash or Bruise.  10. Good muscle tone,  joints appear normal , no effusions, Normal ROM.  11. No Palpable Lymph Nodes in Neck or Axillae   deconditioning, unable to walk by self   Data Review:    CBC Recent Labs  Lab 12/03/17 2007 12/05/17 1553  WBC 10.0 8.6  HGB 10.7* 10.7*  HCT 34.8* 35.0*  PLT 218 246  MCV 92.8 94.1  MCH 28.5 28.8  MCHC 30.7 30.6  RDW 17.7* 17.4*  LYMPHSABS  --  3.2  MONOABS  --  0.6  EOSABS  --  0.1  BASOSABS  --  0.0   ------------------------------------------------------------------------------------------------------------------  Chemistries  Recent Labs  Lab 12/03/17 2007 12/05/17 1553  NA 140 142  K 4.3 4.2  CL 108 109  CO2 24 24  GLUCOSE 134* 156*  BUN 27* 25*  CREATININE 1.17* 1.13*  CALCIUM 9.4 9.4  AST  --  16  ALT  --  8*  ALKPHOS  --  88  BILITOT  --  0.3   ------------------------------------------------------------------------------------------------------------------ estimated creatinine clearance is 27.7 mL/min (A) (by C-G formula based on SCr of 1.13 mg/dL (H)). ------------------------------------------------------------------------------------------------------------------ No results for input(s): TSH, T4TOTAL, T3FREE, THYROIDAB in the last 72 hours.  Invalid input(s): FREET3  Coagulation profile No results for input(s): INR,  PROTIME in the last 168 hours. ------------------------------------------------------------------------------------------------------------------- No results for input(s): DDIMER in the last 72 hours. -------------------------------------------------------------------------------------------------------------------  Cardiac Enzymes No results for  input(s): CKMB, TROPONINI, MYOGLOBIN in the last 168 hours.  Invalid input(s): CK ------------------------------------------------------------------------------------------------------------------    Component Value Date/Time   BNP 77.6 11/11/2017 1633     ---------------------------------------------------------------------------------------------------------------  Urinalysis    Component Value Date/Time   COLORURINE YELLOW 12/03/2017 2146   APPEARANCEUR CLEAR 12/03/2017 2146   LABSPEC 1.020 12/03/2017 2146   PHURINE 5.0 12/03/2017 2146   GLUCOSEU NEGATIVE 12/03/2017 2146   HGBUR NEGATIVE 12/03/2017 2146   BILIRUBINUR NEGATIVE 12/03/2017 2146   [place] NEGATIVE 12/03/2017 2146   PROTEINUR NEGATIVE 12/03/2017 2146   UROBILINOGEN 0.2 10/23/2014 0830   NITRITE NEGATIVE 12/03/2017 2146   LEUKOCYTESUR NEGATIVE 12/03/2017 2146    ----------------------------------------------------------------------------------------------------------------   Imaging Results:    Dg Chest 1 View  Result Date: 12/03/2017 CLINICAL DATA:  Decreased appetite. EXAM: CHEST  1 VIEW COMPARISON:  11/23/2017 FINDINGS: Persistent low lung volumes. Small pleural effusions have decreased from prior exam. Persistent patchy bibasilar opacities. Unchanged heart size and mediastinal contours allowing for differences in technique. No pneumothorax or new focal airspace disease. IMPRESSION: Low lung volumes with persistent bibasilar opacities, may be pneumonia or aspiration. Diminished bilateral pleural effusions from prior. Electronically Signed   By: Jeb Levering M.D.   On: 12/03/2017 23:04   Dg Chest 2 View  Result Date: 12/05/2017 CLINICAL DATA:  Altered mental status and cough EXAM: CHEST - 2 VIEW COMPARISON:  12/03/2017 FINDINGS: Cardiac shadow is stable. Bibasilar changes are noted similar to that seen on the prior exam and multiple previous exams dating back to 10/26/2017. These are likely chronic in  nature. Some mild increased patchy density is noted in the right upper lobe which may represent some early atelectasis/infiltrate. No bony abnormality is noted. IMPRESSION: Chronic changes in the bases. Question early changes in the right upper lobe. Electronically Signed   By: Inez Catalina M.D.   On: 12/05/2017 16:55   Ct Head Wo Contrast  Result Date: 12/05/2017 CLINICAL DATA:  84 year old female with altered mental status EXAM: CT HEAD WITHOUT CONTRAST TECHNIQUE: Contiguous axial images were obtained from the base of the skull through the vertex without intravenous contrast. COMPARISON:  Prior head CT 10/31/2017 FINDINGS: Brain: No evidence of acute infarction, hemorrhage, hydrocephalus, extra-axial collection or mass lesion/mass effect. Stable appearance of advanced cerebral and cerebellar cortical atrophy. Confluent periventricular, subcortical and deep white matter hypoattenuation remain most consistent with advanced chronic microvascular ischemic white matter disease. Vascular: No hyperdense vessel or unexpected calcification. Skull: Normal. Negative for fracture or focal lesion. Sinuses/Orbits: No acute finding. Other: None. IMPRESSION: 1. No acute intracranial abnormality. 2. Stable advanced chronic microvascular ischemic white matter disease and cortical atrophy. Electronically Signed   By: Jacqulynn Cadet M.D.   On: 12/05/2017 15:41      Assessment & Plan:    Principal Problem:   Altered mental state Active Problems:   Diabetes mellitus type II, uncontrolled (HCC)   Dementia with Lewy bodies   HCAP (healthcare-associated pneumonia)    AMS secondary to Hcap Urinalysis pending  Hcap vs aspiration pneumonia Blood culture x2 Urine legionella antigen Urine strep antigen vanco iv pharmacy to dose Cefepime iv pharmacy to dose  Dm2 fsbs ac and qhs, ISS Hold Lantus, not quite sure about her PO intake  CVA Cont aspirin Cont plavix Cont Crestor  Seizure do Cont  Depakote  Dementia Cont Namenda XR   Anemia Cont  Ferrous sulfate  DVT Prophylaxis Lovenox - SCDs   AM Labs Ordered, also please review Full Orders  Family Communication: Admission, patients condition and plan of care including tests being ordered have been discussed with the patient  who indicate understanding and agree with the plan and Code Status.  Code Status DNR  Likely DC to  home  Condition GUARDED    Consults called: none  Admission status: inpatient   Time spent in minutes : 45   Jani Gravel M.D on 12/05/2017 at 7:20 PM  Between 7am to 7pm - Pager - [phone]. After 7pm go to [email or web address] - password [email or web address]  [facility] - Office  [phone]

## 2017-12-06 ENCOUNTER — Other Ambulatory Visit: Payer: Self-pay

## 2017-12-06 DIAGNOSIS — G9341 Metabolic encephalopathy: Secondary | ICD-10-CM

## 2017-12-06 LAB — COMPREHENSIVE METABOLIC PANEL
ALT: 8 U/L — AB (ref 14–54)
AST: 16 U/L (ref 15–41)
Albumin: 2.2 g/dL — ABNORMAL LOW (ref 3.5–5.0)
Alkaline Phosphatase: 74 U/L (ref 38–126)
Anion gap: 8 (ref 5–15)
BUN: 17 mg/dL (ref 6–20)
CHLORIDE: 111 mmol/L (ref 101–111)
CO2: 21 mmol/L — AB (ref 22–32)
CREATININE: 0.81 mg/dL (ref 0.44–1.00)
Calcium: 8.9 mg/dL (ref 8.9–10.3)
GFR calc non Af Amer: >60 mL/min (ref >60)
Glucose, Bld: 134 mg/dL — ABNORMAL HIGH (ref 65–99)
Potassium: 4.1 mmol/L (ref 3.5–5.1)
SODIUM: 140 mmol/L (ref 135–145)
Total Bilirubin: 0.4 mg/dL (ref 0.3–1.2)
Total Protein: 6.4 g/dL — ABNORMAL LOW (ref 6.5–8.1)

## 2017-12-06 LAB — CBC
HCT: 34.3 % — ABNORMAL LOW (ref 36.0–46.0)
Hemoglobin: 10.3 g/dL — ABNORMAL LOW (ref 12.0–15.0)
MCH: 28.7 pg (ref 26.0–34.0)
MCHC: 30 g/dL (ref 30.0–36.0)
MCV: 95.5 fL (ref 78.0–100.0)
PLATELETS: 236 10*3/uL (ref 150–400)
RBC: 3.59 MIL/uL — AB (ref 3.87–5.11)
RDW: 17.6 % — AB (ref 11.5–15.5)
WBC: 6.8 10*3/uL (ref 4.0–10.5)

## 2017-12-06 LAB — HIV ANTIBODY (ROUTINE TESTING W REFLEX): HIV SCREEN 4TH GENERATION: NONREACTIVE

## 2017-12-06 LAB — GLUCOSE, CAPILLARY
GLUCOSE-CAPILLARY: 119 mg/dL — AB (ref 65–99)
GLUCOSE-CAPILLARY: 122 mg/dL — AB (ref 65–99)
GLUCOSE-CAPILLARY: 125 mg/dL — AB (ref 65–99)
Glucose-Capillary: 108 mg/dL — ABNORMAL HIGH (ref 65–99)

## 2017-12-06 LAB — STREP PNEUMONIAE URINARY ANTIGEN: Strep Pneumo Urinary Antigen: NEGATIVE

## 2017-12-06 LAB — INFLUENZA PANEL BY PCR (TYPE A & B)
INFLBPCR: NEGATIVE
Influenza A By PCR: NEGATIVE

## 2017-12-06 LAB — MRSA PCR SCREENING: MRSA by PCR: NEGATIVE

## 2017-12-06 MED ORDER — SODIUM CHLORIDE 0.9 % IV SOLN
1.5000 g | Freq: Three times a day (TID) | INTRAVENOUS | Status: DC
  Administered 2017-12-06 – 2017-12-09 (×9): 1.5 g via INTRAVENOUS
  Filled 2017-12-06 (×11): qty 1.5

## 2017-12-06 MED ORDER — VALPROATE SODIUM 500 MG/5ML IV SOLN
250.0000 mg | Freq: Two times a day (BID) | INTRAVENOUS | Status: DC
  Administered 2017-12-06 – 2017-12-07 (×3): 250 mg via INTRAVENOUS
  Filled 2017-12-06 (×3): qty 2.5

## 2017-12-06 MED ORDER — LEVALBUTEROL HCL 0.63 MG/3ML IN NEBU
0.6300 mg | INHALATION_SOLUTION | Freq: Three times a day (TID) | RESPIRATORY_TRACT | Status: DC
Start: 2017-12-06 — End: 2017-12-08
  Administered 2017-12-06 – 2017-12-08 (×7): 0.63 mg via RESPIRATORY_TRACT
  Filled 2017-12-06 (×7): qty 3

## 2017-12-06 MED ORDER — ENOXAPARIN SODIUM 40 MG/0.4ML ~~LOC~~ SOLN
40.0000 mg | SUBCUTANEOUS | Status: DC
  Administered 2017-12-06 – 2017-12-11 (×6): 40 mg via SUBCUTANEOUS
  Filled 2017-12-06 (×6): qty 0.4

## 2017-12-06 MED ORDER — RESOURCE THICKENUP CLEAR PO POWD
ORAL | Status: DC | PRN
  Filled 2017-12-06: qty 125

## 2017-12-06 NOTE — Progress Notes (Signed)
PROGRESS NOTE    Megan PersonRuby Wieand  XBJ:[id number] DOB: 1922/04/01 DOA: 12/05/2017 PCP: [name], John, MD     Brief Narrative:  Megan [name] is a 84 yo female with past medical history of seizure disorder, dementia, type 2 diabetes, CVA who presented with altered mental status and somnolence.  She had a coughing episode yesterday morning and was brought to the emergency department.  CT head was negative for acute intracranial abnormality.  Chest x-ray revealed questionable early changes in the right upper lobe.  She was previously hospitalized for aspiration pneumonia.  Assessment & Plan:   Principal Problem:   Acute metabolic encephalopathy Active Problems:   Diabetes mellitus type II, uncontrolled (HCC)   Protein-calorie malnutrition, severe (HCC)   Dementia with Lewy bodies   Seizure (HCC)   Unstageable pressure ulcer of sacral region [facility](HCC)   UTI (urinary tract infection)   HCAP (healthcare-associated pneumonia)   Acute metabolic encephalopathy -With underlying dementia at baseline -Improved overnight, however not back to her baseline per daughter at bedside  Likely aspiration pneumonia versus HCAP -Sepsis ruled out  -Influenza negative, strep pneumo Ag negative  -Currently on IV vancomycin, IV cefepime.  Check MRSA PCR, if negative will discontinue IV vancomycin. Change IV cefepime to IV Unasyn to cover anaerobe  -SLP eval for dysphagia, aspiration risk   Urinary tract infection, present on admission -IV Unasyn until culture results available  AKI  -Resolved   Diabetes mellitus type 2 -SSI   CVA -Continue aspirin, Plavix, Crestor  Seizure disorder -Continue Depakote, switch to IV until SLP evaluation  Dementia -Continue Namenda    DVT prophylaxis: Lovenox  Code Status: DNR Family Communication: Daughter at bedside Disposition Plan: Pending culture results, improvement in clinical status   Consultants:   None  Procedures:   None   Antimicrobials:    Anti-infectives (From admission, onward)   Start     Dose/Rate Route Frequency Ordered Stop   12/07/17 2200  vancomycin (VANCOCIN) IVPB 1000 mg/200 mL premix     1,000 mg 200 mL/hr over 60 Minutes Intravenous Every 48 hours 12/05/17 2056     12/06/17 1400  ampicillin-sulbactam (UNASYN) 1.5 g in sodium chloride 0.9 % 100 mL IVPB     1.5 g 200 mL/hr over 30 Minutes Intravenous Every 8 hours 12/06/17 1002     12/05/17 2200  ceFEPIme (MAXIPIME) 1 g in sodium chloride 0.9 % 100 mL IVPB  Status:  Discontinued     1 g 200 mL/hr over 30 Minutes Intravenous Every 24 hours 12/05/17 2057 12/06/17 1000   12/05/17 2000  vancomycin (VANCOCIN) 1,500 mg in sodium chloride 0.9 % 500 mL IVPB     1,500 mg 250 mL/hr over 120 Minutes Intravenous STAT 12/05/17 1958 12/05/17 2304   12/05/17 1745  Ampicillin-Sulbactam (UNASYN) 3 g in sodium chloride 0.9 % 100 mL IVPB     3 g 200 mL/hr over 30 Minutes Intravenous  Once 12/05/17 1743 12/05/17 2101       Subjective: Unable to obtain due to altered mentation  Objective: Vitals:   12/06/17 0000 12/06/17 0458 12/06/17 0755 12/06/17 0759  BP:  (!) 132/54    Pulse:  68    Resp:  16    Temp:  98.3 F (36.8 C)    TempSrc:  Oral    SpO2:  100% 99% 99%  Weight: 64.9 kg (143 lb 1.6 oz)     Height: 5' 2.5" (1.588 m)       Intake/Output Summary (Last 24 hours)  at 12/06/2017 1139 Last data filed at 12/06/2017 0700 Gross per 24 hour  Intake 2213.75 ml  Output 600 ml  Net 1613.75 ml   Filed Weights   12/05/17 1448 12/06/17 0000  Weight: 68 kg (150 lb) 64.9 kg (143 lb 1.6 oz)    Examination:  General exam: Appears calm and comfortable  Respiratory system: Clear to auscultation. Respiratory effort normal. Cardiovascular system: S1 & S2 heard, RRR. No JVD, murmurs, rubs, gallops or clicks. No pedal edema. Gastrointestinal system: Abdomen is nondistended, soft and nontender. No organomegaly or masses felt. Normal bowel sounds heard.  Central nervous system:  Alert  Extremities: Symmetric  Psychiatry: +Dementia   Data Reviewed: I have personally reviewed following labs and imaging studies  CBC: Recent Labs  Lab 12/03/17 2007 12/05/17 1553 12/06/17 0533  WBC 10.0 8.6 6.8  NEUTROABS  --  4.7  --   HGB 10.7* 10.7* 10.3*  HCT 34.8* 35.0* 34.3*  MCV 92.8 94.1 95.5  PLT 218 246 236   Basic Metabolic Panel: Recent Labs  Lab 12/03/17 2007 12/05/17 1553 12/06/17 0533  NA 140 142 140  K 4.3 4.2 4.1  CL 108 109 111  CO2 24 24 21*  GLUCOSE 134* 156* 134*  BUN 27* 25* 17  CREATININE 1.17* 1.13* 0.81  CALCIUM 9.4 9.4 8.9   GFR: Estimated Creatinine Clearance: 38 mL/min (by C-G formula based on SCr of 0.81 mg/dL). Liver Function Tests: Recent Labs  Lab 12/05/17 1553 12/06/17 0533  AST 16 16  ALT 8* 8*  ALKPHOS 88 74  BILITOT 0.3 0.4  PROT 7.1 6.4*  ALBUMIN 2.4* 2.2*   No results for input(s): LIPASE, AMYLASE in the last 168 hours. Recent Labs  Lab 12/05/17 1553  AMMONIA 23   Coagulation Profile: No results for input(s): INR, PROTIME in the last 168 hours. Cardiac Enzymes: No results for input(s): CKTOTAL, CKMB, CKMBINDEX, TROPONINI in the last 168 hours. BNP (last 3 results) No results for input(s): PROBNP in the last 8760 hours. HbA1C: No results for input(s): HGBA1C in the last 72 hours. CBG: Recent Labs  Lab 12/03/17 1945 12/05/17 1550 12/05/17 2331 12/06/17 0753  GLUCAP 126* 147* 80 119*   Lipid Profile: No results for input(s): CHOL, HDL, LDLCALC, TRIG, CHOLHDL, LDLDIRECT in the last 72 hours. Thyroid Function Tests: No results for input(s): TSH, T4TOTAL, FREET4, T3FREE, THYROIDAB in the last 72 hours. Anemia Panel: No results for input(s): VITAMINB12, FOLATE, FERRITIN, TIBC, IRON, RETICCTPCT in the last 72 hours. Sepsis Labs: Recent Labs  Lab 12/03/17 2040 12/03/17 2232 12/05/17 1559  LATICACIDVEN 1.25 1.20 1.11    No results found for this or any previous visit (from the past 240 hour(s)).      Radiology Studies: Dg Chest 2 View  Result Date: 12/05/2017 CLINICAL DATA:  Altered mental status and cough EXAM: CHEST - 2 VIEW COMPARISON:  12/03/2017 FINDINGS: Cardiac shadow is stable. Bibasilar changes are noted similar to that seen on the prior exam and multiple previous exams dating back to 10/26/2017. These are likely chronic in nature. Some mild increased patchy density is noted in the right upper lobe which may represent some early atelectasis/infiltrate. No bony abnormality is noted. IMPRESSION: Chronic changes in the bases. Question early changes in the right upper lobe. Electronically Signed   By: Alcide Clever M.D.   On: 12/05/2017 16:55   Ct Head Wo Contrast  Result Date: 12/05/2017 CLINICAL DATA:  84 year old female with altered mental status EXAM: CT HEAD WITHOUT CONTRAST TECHNIQUE:  Contiguous axial images were obtained from the base of the skull through the vertex without intravenous contrast. COMPARISON:  Prior head CT 10/31/2017 FINDINGS: Brain: No evidence of acute infarction, hemorrhage, hydrocephalus, extra-axial collection or mass lesion/mass effect. Stable appearance of advanced cerebral and cerebellar cortical atrophy. Confluent periventricular, subcortical and deep white matter hypoattenuation remain most consistent with advanced chronic microvascular ischemic white matter disease. Vascular: No hyperdense vessel or unexpected calcification. Skull: Normal. Negative for fracture or focal lesion. Sinuses/Orbits: No acute finding. Other: None. IMPRESSION: 1. No acute intracranial abnormality. 2. Stable advanced chronic microvascular ischemic white matter disease and cortical atrophy. Electronically Signed   By: Malachy Moan M.D.   On: 12/05/2017 15:41      Scheduled Meds: . acidophilus  2 capsule Oral TID  . aspirin EC  81 mg Oral Daily  . budesonide  0.25 mg Nebulization BID  . clopidogrel  75 mg Oral Q breakfast  . enoxaparin (LOVENOX) injection  40 mg  Subcutaneous Q24H  . famotidine  20 mg Oral Daily  . feeding supplement (PRO-STAT SUGAR FREE 64)  30 mL Oral TID WC & HS  . ferrous sulfate  325 mg Oral Q breakfast  . guaiFENesin  10 mL Oral QID  . insulin aspart  0-5 Units Subcutaneous QHS  . insulin aspart  0-9 Units Subcutaneous TID WC  . levalbuterol  0.63 mg Nebulization TID  . memantine  14 mg Oral QHS  . multivitamin with minerals  1 tablet Oral Daily  . protein supplement shake  2 oz Oral TID WC & HS  . [START ON 12/07/2017] rosuvastatin  5 mg Oral Q M,W,F  . sodium chloride flush  3 mL Intravenous Q12H  . vitamin C  500 mg Oral Daily   Continuous Infusions: . sodium chloride    . ampicillin-sulbactam (UNASYN) IV    . valproate sodium    . [START ON 12/07/2017] vancomycin       LOS: 1 day    Time spent: 30 minutes   Noralee Stain, DO [facility] [email or web address] Password [facility] 12/06/2017, 11:39 AM

## 2017-12-06 NOTE — Progress Notes (Signed)
Initial Nutrition Assessment  DOCUMENTATION CODES:   Not applicable  INTERVENTION:  - Diet advancement as medically feasible. - RD will provide warranted interventions at follow-up.   NUTRITION DIAGNOSIS:   Inadequate oral intake related to lethargy/confusion, inability to eat as evidenced by NPO status, other (comment)(daughter's report).  GOAL:   Patient will meet greater than or equal to 90% of their needs  MONITOR:   Diet advancement, Weight trends, Labs, Skin  REASON FOR ASSESSMENT:   Malnutrition Screening Tool  ASSESSMENT:   84 yo female with past medical history of seizure disorder, dementia, type 2 diabetes, CVA who presented with altered mental status and somnolence.  She had a coughing episode yesterday morning and was brought to the emergency department.  CT head was negative for acute intracranial abnormality.  Chest x-ray revealed questionable early changes in the right upper lobe.  She was previously hospitalized for aspiration pneumonia.  BMI indicates overweight status, appropriate for advanced age. Pt has been NPO since admission. SLP working with pt at bedside at the time of RD visit. Pt's daughter, who was at bedside, provided all information as pt is noted to be nonverbal. Daughter reports that pt has been hospitalized several times for aspiration PNA and that she has more recently been placed on a pureed (Dysphagia 1) diet. Pt did not like this diet. She was drinking Ensure and other protein shakes in the past. Daughter reports that for the past 4-5 weeks pt has had a very poor appetite, mainly only consuming bites of applesauce or pudding with medications and that she has been pocketing items. She reports that when pt was on antibiotics for aspiration PNA she had diarrhea and that anything she consumed would "go right through her." She is uncertain if this is what caused pt to not want to eat/drink much.   Physical assessment outlined below. Per chart review, pt  has lost 6 lbs (4% body weight) in the past 3 weeks which is significant for time frame. RD does not feel comfortable identifying malnutrition at this time but will update if warranted in the future.   Medications reviewed; 2 capsules Risaquad TID, 20 mg oral Pepcid/day, sliding scale Novolog, daily multivitamin with minerals, 500 mg oral ascorbic acid/day.  Labs reviewed; CBG: 119 mg/dL this AM.     NUTRITION - FOCUSED PHYSICAL EXAM:  Completed/assessed; no muscle and no fat wasting.   Diet Order:  Diet NPO time specified Except for: Sips with Meds  EDUCATION NEEDS:   No education needs have been identified at this time  Skin:  Skin Assessment: Skin Integrity Issues: Skin Integrity Issues:: DTI, Stage III, Unstageable DTI: L heel and R toe Stage III: bilateral buttocks Unstageable: full thickness L heel  Last BM:  PTA/unknown  Height:   Ht Readings from Last 1 Encounters:  12/06/17 5' 2.5" (1.588 m)    Weight:   Wt Readings from Last 1 Encounters:  12/06/17 143 lb 1.6 oz (64.9 kg)    Ideal Body Weight:  51.14 kg  BMI:  Body mass index is 25.76 kg/m.  Estimated Nutritional Needs:   Kcal:  1300-1500 (20-23 kcal/kg).  Protein:  50-60 grams  Fluid:  >/= 1.5 L/day     Trenton GammonJessica Andreas Sobolewski, MS, RD, LDN, [facility] Inpatient Clinical Dietitian Pager # [phone] After hours/weekend pager # [phone]

## 2017-12-06 NOTE — Evaluation (Signed)
Clinical/Bedside Swallow Evaluation Patient Details  Name: Megan [name] MRN: [id number] Date of Birth: May 30, 1922  Today's Date: 12/06/2017 Time: SLP Start Time (ACUTE ONLY): 1158 SLP Stop Time (ACUTE ONLY): 1249 SLP Time Calculation (min) (ACUTE ONLY): 51 min  Past Medical History:  Past Medical History:  Diagnosis Date  . Dementia   . Diabetes mellitus   . Hyperlipidemia   . Pneumonia 10/2017  . Seizure (HCC)   . Sepsis (HCC) 11/24/2017  . Stroke [facility](HCC) 2012   possible stroke Jan 2016  . UTI (urinary tract infection) 10/2017   Past Surgical History:  Past Surgical History:  Procedure Laterality Date  . CATARACT EXTRACTION     HPI:  [name] who is presenting from home for worsening mental status, nausea vomiting, diarrhea, possible aspiration by reported her daughter.  PMH + for dementia, recent ABX use causing nausea/vomiting, dehydration and poor po intake.  Pt resides with family.  Swallow evaluation ordered.  Of note, pt was to possibly be seen by home hospice previously but [facility].  Pt now admitted with AMS- Megan Cosierheresa daughter present.    Assessment / Plan / Recommendation Clinical Impression  Pt presents with worsening of dysphagia that was intially diagnosed during March 2019 admission.  Suspect this is an exacerbation of baseline dysphagia from dementia due to pt's recent diarrhea/dehdration etc.  Suspected increased oral holding, delayed oral transiting with obvious labial spillage on right.  Pharyngeal swallow likely very delayed with inconsistent triggering.  Subtle cough noted with moisture from toothette - with oral care.  Pt tends to flex her head back and forth - ? To help elicit swallow?  No s/s of aspiration with icecream and Ensure - although swallow is very delayed and not functional for nutritional support.   Family Megan Cosierheresa admits to increased coughing with water/thin liquids.  Pt is a high  aspiration risk to her cognitive based dysphagia and inconsistent swallow response.  Do not recommend MBS as this will not change pt's outcomes/recommendations based on this SLPs opinion.    Full liquid/nectar for comfort could be considered and Megan Cosierheresa agreeable *though her sister Megan Bibleat is HCPOA.  Extensive education done with Megan Cosierheresa as well as strategies determined to mitigate aspiration risk.    Discussed po for comfort and not nutrtional or hydration support.  Recommend to revisit palliative care given ongoing dysphagia/medical issues.  Will follow for family education and to determine if MBS may be indicated in future *it pt's intake and swallow efficiency improves.   SLP Visit Diagnosis: Dysphagia, oropharyngeal phase (R13.12)       Aspiration Risk  Severe aspiration risk;Risk for inadequate nutrition/hydration    Diet Recommendation Nectar-thick liquid(full liquids for comfort)   Liquid Administration via: Straw;Spoon Medication Administration: Crushed with puree Supervision: Full supervision/cueing for compensatory strategies Compensations: Slow rate;Small sips/bites;Other (Comment)(assure pt swallows before giving more intake, stop intake if pt coughingh) Postural Changes: Remain upright for at least 30 minutes after po intake;Seated upright at 90 degrees    Other  Recommendations Oral Care Recommendations: Oral care QID Other Recommendations: Order thickener from pharmacy;Other (Comment)(clean mouth after meals)   Follow up Recommendations (tbd)      Frequency and Duration min 2x/week  1 week       Prognosis Prognosis for Safe Diet Advancement: Fair      Swallow Study   General Date of Onset: 12/06/17 HPI: [name] who is presenting from home for worsening mental status, nausea vomiting,  diarrhea, possible aspiration by reported her daughter.  PMH + for dementia, recent ABX use causing nausea/vomiting, dehydration and poor po intake.  Pt  resides with family.  Swallow evaluation ordered.  Of note, pt was to possibly be seen by home hospice previously but family determined to continue [facility] PT/ST and nursing.  Pt now admitted with AMS- Megan [name] daughter present.  Type of Study: Bedside Swallow Evaluation Diet Prior to this Study: NPO Temperature Spikes Noted: No Respiratory Status: Room air History of Recent Intubation: No Behavior/Cognition: Pleasant mood;Alert Oral Cavity - Dentition: Edentulous;Other (Comment)(has not worn dentures since having issues with swallowing recently) Self-Feeding Abilities: Needs assist Patient Positioning: Upright in bed Baseline Vocal Quality: Normal Volitional Cough: (weak reflexive cough noted) Volitional Swallow: Unable to elicit    Oral/Motor/Sensory Function Overall Oral Motor/Sensory Function: Generalized oral weakness Facial ROM: Reduced right;Other (Comment)(slight) Facial Symmetry: Abnormal symmetry right;Other (Comment)(slight) Lingual ROM: Reduced right Lingual Symmetry: Abnormal symmetry right;Other (Comment)(slight) Lingual Strength: Reduced Velum: Other (comment)(did not view) Mandible: Other (Comment)(dnt)   Ice Chips Ice chips: Not tested   Thin Liquid Thin Liquid: Impaired Presentation: Straw;Spoon Oral Phase Impairments: Reduced labial seal Oral Phase Functional Implications: Oral holding;Right anterior spillage Pharyngeal  Phase Impairments: Suspected delayed Swallow;Cough - Immediate    Nectar Thick Nectar Thick Liquid: Impaired Presentation: Straw Oral Phase Impairments: Reduced labial seal Oral phase functional implications: Right anterior spillage;Oral holding Pharyngeal Phase Impairments: Suspected delayed Swallow   Honey Thick Honey Thick Liquid: Not tested   Puree Puree: Not tested(icecream provided)   Solid   GO   Solid: Not tested        Chales Abrahams 12/06/2017,1:04 PM [name], MS [facility] SLP [phone]

## 2017-12-06 NOTE — Evaluation (Signed)
Physical Therapy Evaluation Patient Details Name: Megan [name] MRN: [id number] DOB: 06/15/1922 Today's Date: 12/06/2017   History of Present Illness  Pt is a 84 year old female who presented with altered mental status and somnolence and admitted for acute metabolic encephalopathy and had recent admission for aspiration pneumonia  .PMH including but not limited to dementia, CVA with R sided weakness, DM and HTN.   Clinical Impression  Pt admitted with above diagnosis. Pt currently with functional limitations due to the deficits listed below (see PT Problem List).  Pt will benefit from skilled PT to increase their independence and safety with mobility to allow discharge to the venue listed below.  Pt with multiple admission over the last few months.  Daughter present and reports family assists pt with transfers and ADLs.   Pt is typically able to provide more assist for transfers then prior to admission.  Daughter would like for pt's strength to improve so pt can assist again with mobility.  SNF has been recommended by PT from prior admissions although it appears pt typically discharges home.  Daughter reports she had a hoyer lift at home however was not using (preferred to physically assist, reports less hassle with lift pad), so she returned lift.  Daughter also reports checking pt's skin for breakdown.  Daughter also reports she assists pt with ROM/exercises due to weak R extremities from prior CVA.     Follow Up Recommendations SNF;Supervision/Assistance - 24 hour(however family has been providing care, HHPT since recent admission)    Equipment Recommendations  None recommended by PT(daughter states she returned hoyer lift, does not like to use)    Recommendations for Other Services       Precautions / Restrictions Precautions Precautions: Fall      Mobility  Bed Mobility Overal bed mobility: Needs Assistance Bed Mobility: Supine to Sit;Sit to Supine     Supine to sit: Total assist;+2  for physical assistance Sit to supine: Total assist;+2 for physical assistance   General bed mobility comments: assist for upper and lower body, utilized bed pad for positioning  Transfers                 General transfer comment: pt felt unable to stand today  Ambulation/Gait                Stairs            Wheelchair Mobility    Modified Rankin (Stroke Patients Only)       Balance Overall balance assessment: Needs assistance Sitting-balance support: Feet supported;Single extremity supported Sitting balance-Leahy Scale: Poor Sitting balance - Comments: forward lean, assist occasionally for external support however pt able to sit in flexed posture with min/guard assist                                     Pertinent Vitals/Pain Pain Assessment: Faces Faces Pain Scale: No hurt    Home Living Family/patient expects to be discharged to:: Private residence Living Arrangements: Children Available Help at Discharge: Family;Available 24 hours/day Type of Home: House Home Access: Level entry     Home Layout: One level Home Equipment: Walker - 2 wheels;Bedside commode;Wheelchair - manual;Hospital bed Additional Comments: also has a lift chair    Prior Function Level of Independence: Needs assistance   Gait / Transfers Assistance Needed: 1-2 person assist for transfers, uses w/c needs assistance to propel; was receiving HHPT  since previous admission  ADL's / Homemaking Assistance Needed: total A  Comments: daughter reports pt requires at least min/guard to min assist for transfers to w/c at baseline which family has been assisting; she reports pt requiring more and more assist leading to admission     Hand Dominance        Extremity/Trunk Assessment   Upper Extremity Assessment Upper Extremity Assessment: Generalized weakness(residual R sided weakness since CVA)    Lower Extremity Assessment Lower Extremity Assessment: Generalized  weakness(residual R sided weakness since CVA)    Cervical / Trunk Assessment Cervical / Trunk Assessment: Kyphotic(forward head posture)  Communication   Communication: Expressive difficulties;HOH  Cognition Arousal/Alertness: Awake/alert Behavior During Therapy: Flat affect Overall Cognitive Status: History of cognitive impairments - at baseline                                 General Comments: follow multimodal commands (HOH); pt with little verbalizations      General Comments General comments (skin integrity, edema, etc.): both daughters present during session    Exercises     Assessment/Plan    PT Assessment Patient needs continued PT services  PT Problem List Decreased strength;Decreased mobility;Decreased activity tolerance;Decreased balance;Decreased knowledge of use of DME;Decreased skin integrity       PT Treatment Interventions DME instruction;Therapeutic activities;Gait training;Therapeutic exercise;Patient/family education;Functional mobility training;Balance training    PT Goals (Current goals can be found in the Care Plan section)  Acute Rehab PT Goals Patient Stated Goal: to go home per daughter  PT Goal Formulation: With family Time For Goal Achievement: 12/20/17 Potential to Achieve Goals: Fair    Frequency Min 3X/week   Barriers to discharge        Co-evaluation               AM-PAC PT "6 Clicks" Daily Activity  Outcome Measure Difficulty turning over in bed (including adjusting bedclothes, sheets and blankets)?: Unable Difficulty moving from lying on back to sitting on the side of the bed? : Unable Difficulty sitting down on and standing up from a chair with arms (e.g., wheelchair, bedside commode, etc,.)?: Unable Help needed moving to and from a bed to chair (including a wheelchair)?: Total Help needed walking in hospital room?: Total Help needed climbing 3-5 steps with a railing? : Total 6 Click Score: 6    End of  Session   Activity Tolerance: Patient limited by fatigue Patient left: with call bell/phone within reach;with family/visitor present;in bed;with bed alarm set Nurse Communication: Mobility status;Need for lift equipment PT Visit Diagnosis: Other abnormalities of gait and mobility (R26.89)    Time: 1000-1024 PT Time Calculation (min) (ACUTE ONLY): 24 min   Charges:   PT Evaluation $PT Eval Moderate Complexity: 1 Mod     PT G Codes:       Zenovia Jarred, PT, DPT 12/06/2017 Pager: [phone]  Maida Sale E 12/06/2017, 2:10 PM

## 2017-12-07 DIAGNOSIS — N3000 Acute cystitis without hematuria: Secondary | ICD-10-CM

## 2017-12-07 LAB — LEGIONELLA PNEUMOPHILA SEROGP 1 UR AG: L. pneumophila Serogp 1 Ur Ag: NEGATIVE

## 2017-12-07 LAB — BASIC METABOLIC PANEL
Anion gap: 8 (ref 5–15)
BUN: 11 mg/dL (ref 6–20)
CALCIUM: 8.8 mg/dL — AB (ref 8.9–10.3)
CO2: 22 mmol/L (ref 22–32)
Chloride: 111 mmol/L (ref 101–111)
Creatinine, Ser: 0.73 mg/dL (ref 0.44–1.00)
GFR calc Af Amer: >60 mL/min (ref >60)
GFR calc non Af Amer: >60 mL/min (ref >60)
GLUCOSE: 121 mg/dL — AB (ref 65–99)
Potassium: 3.8 mmol/L (ref 3.5–5.1)
Sodium: 141 mmol/L (ref 135–145)

## 2017-12-07 LAB — CBC
HEMATOCRIT: 31.4 % — AB (ref 36.0–46.0)
Hemoglobin: 9.9 g/dL — ABNORMAL LOW (ref 12.0–15.0)
MCH: 29.2 pg (ref 26.0–34.0)
MCHC: 31.5 g/dL (ref 30.0–36.0)
MCV: 92.6 fL (ref 78.0–100.0)
Platelets: 238 10*3/uL (ref 150–400)
RBC: 3.39 MIL/uL — ABNORMAL LOW (ref 3.87–5.11)
RDW: 17.4 % — AB (ref 11.5–15.5)
WBC: 8 10*3/uL (ref 4.0–10.5)

## 2017-12-07 LAB — GLUCOSE, CAPILLARY
Glucose-Capillary: 112 mg/dL — ABNORMAL HIGH (ref 65–99)
Glucose-Capillary: 156 mg/dL — ABNORMAL HIGH (ref 65–99)
Glucose-Capillary: 152 mg/dL — ABNORMAL HIGH (ref 65–99)
Glucose-Capillary: 148 mg/dL — ABNORMAL HIGH (ref 65–99)

## 2017-12-07 MED ORDER — ASPIRIN 81 MG PO CHEW
81.0000 mg | CHEWABLE_TABLET | Freq: Every day | ORAL | Status: DC
  Administered 2017-12-08 – 2017-12-12 (×5): 81 mg via ORAL
  Filled 2017-12-07 (×5): qty 1

## 2017-12-07 MED ORDER — ENSURE ENLIVE PO LIQD
237.0000 mL | Freq: Two times a day (BID) | ORAL | Status: DC
  Administered 2017-12-07 – 2017-12-12 (×9): 237 mL via ORAL

## 2017-12-07 MED ORDER — VALPROATE SODIUM 250 MG/5ML PO SOLN
250.0000 mg | Freq: Two times a day (BID) | ORAL | Status: DC
Start: 2017-12-07 — End: 2017-12-12
  Administered 2017-12-07 – 2017-12-12 (×10): 250 mg via ORAL
  Filled 2017-12-07 (×10): qty 5

## 2017-12-07 NOTE — Care Management Note (Signed)
Case Management Note  Patient Details  Name: Omar PersonRuby Crockett MRN: [id number] Date of Birth: 09-11-1921  Subjective/Objective: Spoke to dtr in law about d/c plan-PT recc SNF, family plesantly declines SNF-want to continue w/[facility] for HHRN/PT/ST, will add aide rep Marcelino DusterMichelle aware. Likely need PTAR for non emergency transportation.                   Action/Plan:d/c home w/HHC   Expected Discharge Date:  (unknown)               Expected Discharge Plan:  Home w Home Health Services  In-House Referral:     Discharge planning Services  CM Consult  Post Acute Care Choice:  Home Health(HHRN/HHPT/HHST) Choice offered to:  Patient  DME Arranged:    DME Agency:     HH Arranged:  RN, PT, Speech Therapy HH Agency:  [facility]  Status of Service:  In process, will continue to follow  If discussed at Long Length of Stay Meetings, dates discussed:    Additional Comments:  Lanier ClamMahabir, Lamone Ferrelli, RN 12/07/2017, 2:10 PM

## 2017-12-07 NOTE — Progress Notes (Signed)
[facility]  PROGRESS NOTE  Megan [name] WGN:[id number] DOB: 14-Dec-1921 DOA: 12/05/2017 PCP: [name], MD   Brief HPI:    84 yo female with past medical history of seizure disorder, dementia, type 2 diabetes, CVA who presented with altered mental status and somnolence.  She had a coughing episode yesterday morning and was brought to the emergency department.  CT head was negative for acute intracranial abnormality.  Chest x-ray revealed questionable early changes in the right upper lobe.  She was previously hospitalized for aspiration pneumonia     Subjective   Patient seen and examined, denies pain.  Speech therapy evaluated this morning and patient is only on p.o. for comfort.   Assessment/Plan:     1. Acute metabolic encephalopathy-likely from dementia, patient is improved and following commands. 2. Dysphagia-patient was evaluated by speech therapy, started on p.o. diet only for comfort.  Palliative care has been consulted 3. Aspiration pneumonia versus H CAP-sepsis ruled out, influenza negative, strep pneumo antigen negative.  Continue IV vancomycin and Unasyn 4. UTI-continue with Unasyn until culture results are available. 5. Acute kidney injury-resolved 6. Diabetes mellitus-continue sliding scale insulin with NovoLog 7. History of CVA-continue aspirin, Plavix, Crestor 8. Seizure disorder-continue Depakote 9. Dementia-continue Namenda    DVT prophylaxis: Lovenox  Code Status: Lovenox  Family Communication: Discussed with daughter at bedside  Disposition Plan: SNF   Consultants:  None   Procedures:  None    Antibiotics:   Anti-infectives (From admission, onward)   Start     Dose/Rate Route Frequency Ordered Stop   12/07/17 2200  vancomycin (VANCOCIN) IVPB 1000 mg/200 mL premix  Status:  Discontinued     1,000 mg 200 mL/hr over 60 Minutes Intravenous Every 48 hours 12/05/17 2056 12/06/17 1920   12/06/17 1400  ampicillin-sulbactam (UNASYN) 1.5 g in  sodium chloride 0.9 % 100 mL IVPB     1.5 g 200 mL/hr over 30 Minutes Intravenous Every 8 hours 12/06/17 1002     12/05/17 2200  ceFEPIme (MAXIPIME) 1 g in sodium chloride 0.9 % 100 mL IVPB  Status:  Discontinued     1 g 200 mL/hr over 30 Minutes Intravenous Every 24 hours 12/05/17 2057 12/06/17 1000   12/05/17 2000  vancomycin (VANCOCIN) 1,500 mg in sodium chloride 0.9 % 500 mL IVPB     1,500 mg 250 mL/hr over 120 Minutes Intravenous STAT 12/05/17 1958 12/05/17 2304   12/05/17 1745  Ampicillin-Sulbactam (UNASYN) 3 g in sodium chloride 0.9 % 100 mL IVPB     3 g 200 mL/hr over 30 Minutes Intravenous  Once 12/05/17 1743 12/05/17 2101       Objective   Vitals:   12/07/17 0759 12/07/17 0803 12/07/17 1248 12/07/17 1403  BP:   (!) 117/54   Pulse:   70   Resp:   16   Temp:   98.9 F (37.2 C)   TempSrc:   Oral   SpO2: 99% 99% 99% 96%  Weight:      Height:        Intake/Output Summary (Last 24 hours) at 12/07/2017 1700 Last data filed at 12/07/2017 1536 Gross per 24 hour  Intake 572.5 ml  Output 400 ml  Net 172.5 ml   Filed Weights   12/05/17 1448 12/06/17 0000  Weight: 68 kg (150 lb) 64.9 kg (143 lb 1.6 oz)     Physical Examination:   Physical Exam:  Eyes: No icterus, extraocular muscles intact  Mouth: Oral mucosa is moist, no lesions on  palate,  Neck: Supple, no deformities, masses, or tenderness Lungs: Normal respiratory effort, bilateral clear to auscultation, no crackles or wheezes.  Heart: Regular rate and rhythm, S1 and S2 normal, no murmurs, rubs auscultated Abdomen: BS normoactive,soft,nondistended,non-tender to palpation,no organomegaly Extremities: No pretibial edema, no erythema, no cyanosis, no clubbing Neuro : Alert, moving all extremities, follows commands      Data Reviewed: I have personally reviewed following labs and imaging studies  CBG: Recent Labs  Lab 12/06/17 1144 12/06/17 1652 12/06/17 2057 12/07/17 0804 12/07/17 1207  GLUCAP 122*  125* 108* 112* 156*    CBC: Recent Labs  Lab 12/03/17 2007 12/05/17 1553 12/06/17 0533 12/07/17 0509  WBC 10.0 8.6 6.8 8.0  NEUTROABS  --  4.7  --   --   HGB 10.7* 10.7* 10.3* 9.9*  HCT 34.8* 35.0* 34.3* 31.4*  MCV 92.8 94.1 95.5 92.6  PLT 218 246 236 238    Basic Metabolic Panel: Recent Labs  Lab 12/03/17 2007 12/05/17 1553 12/06/17 0533 12/07/17 0509  NA 140 142 140 141  K 4.3 4.2 4.1 3.8  CL 108 109 111 111  CO2 24 24 21* 22  GLUCOSE 134* 156* 134* 121*  BUN 27* 25* 17 11  CREATININE 1.17* 1.13* 0.81 0.73  CALCIUM 9.4 9.4 8.9 8.8*    Recent Results (from the past 240 hour(s))  Urine culture     Status: Abnormal (Preliminary result)   Collection Time: 12/05/17  3:53 PM  Result Value Ref Range Status   Specimen Description   Final    URINE, CATHETERIZED Performed at [facility], [address], [place], [place] [address]    Special Requests   Final    URINE, CATHETERIZED Performed at [facility], [address], [place], [place] [address]    Culture (A)  Final    >=100,000 COLONIES/mL KLEBSIELLA PNEUMONIAE SUSCEPTIBILITIES TO FOLLOW Performed at [facility], [address] [place], [place] [address]    Report Status PENDING  Incomplete  Culture, blood (routine x 2)     Status: None (Preliminary result)   Collection Time: 12/05/17  5:44 PM  Result Value Ref Range Status   Specimen Description   Final    BLOOD RIGHT ANTECUBITAL Performed at [facility], [address], [place], [place] [address]    Special Requests   Final    BOTTLES DRAWN AEROBIC AND ANAEROBIC Blood Culture results may not be optimal due to an excessive volume of blood received in culture bottles Performed at [facility], [address], [place], [place] [address]    Culture   Final    NO GROWTH 2 DAYS Performed at [facility], [address], [place], [place] [address]    Report Status  PENDING  Incomplete  Culture, blood (routine x 2)     Status: None (Preliminary result)   Collection Time: 12/05/17  7:21 PM  Result Value Ref Range Status   Specimen Description   Final    BLOOD BLOOD LEFT HAND Performed at [facility], [address], [place], [place] [address]    Special Requests   Final    BOTTLES DRAWN AEROBIC ONLY Blood Culture results may not be optimal due to an inadequate volume of blood received in culture bottles Performed at [facility], [address] [place], [place] [address]    Culture   Final    NO GROWTH 2 DAYS Performed  at [facility], [address] [place], [place] [address]    Report Status PENDING  Incomplete  [facility]     Status: None   Collection Time: 12/06/17 10:00 AM  Result Value Ref Range Status   MRSA by PCR NEGATIVE NEGATIVE Final    Comment:        The GeneXpert MRSA Assay (FDA approved for NASAL specimens only), is one component of a comprehensive MRSA colonization surveillance program. It is not intended to diagnose MRSA infection nor to guide or monitor treatment for MRSA infections. Performed at [facility], [address] [place], [place] [address]      Liver Function Tests: Recent Labs  Lab 12/05/17 1553 12/06/17 0533  AST 16 16  ALT 8* 8*  ALKPHOS 88 74  BILITOT 0.3 0.4  PROT 7.1 6.4*  ALBUMIN 2.4* 2.2*   No results for input(s): LIPASE, AMYLASE in the last 168 hours. Recent Labs  Lab 12/05/17 1553  AMMONIA 23    Cardiac Enzymes: No results for input(s): CKTOTAL, CKMB, CKMBINDEX, TROPONINI in the last 168 hours. BNP (last 3 results) Recent Labs    11/11/17 1633  BNP 77.6    ProBNP (last 3 results) No results for input(s): PROBNP in the last 8760 hours.    Studies: No results found.  Scheduled Meds: . acidophilus  2 capsule Oral TID  . [START ON 12/08/2017] aspirin  81 mg Oral Daily  . budesonide  0.25 mg  Nebulization BID  . clopidogrel  75 mg Oral Q breakfast  . enoxaparin (LOVENOX) injection  40 mg Subcutaneous Q24H  . famotidine  20 mg Oral Daily  . feeding supplement (ENSURE ENLIVE)  237 mL Oral BID BM  . ferrous sulfate  325 mg Oral Q breakfast  . guaiFENesin  10 mL Oral QID  . insulin aspart  0-5 Units Subcutaneous QHS  . insulin aspart  0-9 Units Subcutaneous TID WC  . levalbuterol  0.63 mg Nebulization TID  . memantine  14 mg Oral QHS  . multivitamin with minerals  1 tablet Oral Daily  . rosuvastatin  5 mg Oral Q M,W,F  . sodium chloride flush  3 mL Intravenous Q12H  . Valproate Sodium  250 mg Oral BID  . vitamin C  500 mg Oral Daily      Time spent: 20 min  Meredeth IdeGagan S Amor Packard   [facility] Pager [phone]. If 7PM-7AM, please contact night-coverage at [email or web address], Office  [phone]  password [email or web address]  12/07/2017, 5:00 PM  LOS: 2 days

## 2017-12-07 NOTE — Progress Notes (Signed)
Chart reviewed and patient assessment completed. Patient with baseline dementia and nonverbal. Drinking ensure for family during my visit. Daughter, Wilnette Kaleshelma at bedside. Wilnette Kaleshelma tells me the patient lives with her daughter Dondra SpryGail who is also documented HCPOA. Wilnette Kaleshelma tells me Dondra SpryGail stays overnight with Ms. Saiki and will be at the hospital tonight until tomorrow at 10am. PMT contact number left. [name] plans to tell Dondra SpryGail I will visit in the morning.   NO CHARGE  Vennie HomansMegan Nariyah Osias, FNP-C Palliative Medicine Team  Phone: [phone] Fax: [phone]

## 2017-12-08 DIAGNOSIS — R4182 Altered mental status, unspecified: Secondary | ICD-10-CM

## 2017-12-08 DIAGNOSIS — Z7189 Other specified counseling: Secondary | ICD-10-CM

## 2017-12-08 DIAGNOSIS — G3183 Dementia with Lewy bodies: Secondary | ICD-10-CM

## 2017-12-08 DIAGNOSIS — Z515 Encounter for palliative care: Secondary | ICD-10-CM

## 2017-12-08 DIAGNOSIS — J690 Pneumonitis due to inhalation of food and vomit: Principal | ICD-10-CM

## 2017-12-08 DIAGNOSIS — R1310 Dysphagia, unspecified: Secondary | ICD-10-CM

## 2017-12-08 DIAGNOSIS — F0280 Dementia in other diseases classified elsewhere without behavioral disturbance: Secondary | ICD-10-CM

## 2017-12-08 LAB — GLUCOSE, CAPILLARY
GLUCOSE-CAPILLARY: 152 mg/dL — AB (ref 65–99)
Glucose-Capillary: 118 mg/dL — ABNORMAL HIGH (ref 65–99)
Glucose-Capillary: 135 mg/dL — ABNORMAL HIGH (ref 65–99)
Glucose-Capillary: 132 mg/dL — ABNORMAL HIGH (ref 65–99)

## 2017-12-08 MED ORDER — LEVALBUTEROL HCL 0.63 MG/3ML IN NEBU
0.6300 mg | INHALATION_SOLUTION | Freq: Two times a day (BID) | RESPIRATORY_TRACT | Status: DC
  Administered 2017-12-08 – 2017-12-12 (×8): 0.63 mg via RESPIRATORY_TRACT
  Filled 2017-12-08 (×8): qty 3

## 2017-12-08 NOTE — Consult Note (Signed)
Consultation Note Date: 12/08/2017   Patient Name: Megan [name]  DOB: 01-27-1922  MRN: [id number]  Age / Sex: 84 y.o., female  PCP: Lavone Orn, MD Referring Physician: Oswald Hillock, MD  Reason for Consultation: Establishing goals of care  HPI/Patient Profile: 84 y.o. female  with past medical history of dementia, stroke, seizures, DM, HLD, dysphagia, sepsis, pneumonia, and UTI's admitted on 12/05/2017 with altered mental status. In ED, CT head negative for acute findings. Chest xray revealed RUL changes. Started on IV antibiotics. SLP following for severe dysphagia and aspiration risk. Per attending, PO diet initiated for comfort. Palliative medicine consultation for goals of care.   Clinical Assessment and Goals of Care: I have reviewed medical records, discussed with care team, and met with patient and daughters [name] and Maynard) at bedside to discuss diagnosis, [place], EOL wishes, disposition and options. [name] is documented HCPOA and primary caregiver.   Introduced Palliative Medicine as specialized medical care for people living with serious illness. It focuses on providing relief from the symptoms and stress of a serious illness. The goal is to improve quality of life for both the patient and the family.   Patient known to PMT from previous admissions. Notes reviewed. Patient lives at home with her daughter [name]). Family is very supportive and provides excellent care to her at home. It has remained important for family to keep her home and out of a nursing facility. Reviewed baseline prior to hospital admission. Patient is wheelchair/bed bound but [name] has a routine every morning of pivoting her from bed to wheelchair, and then recliner in the afternoon. She is on a dysphagia diet at home with thickened liquids. Ms. Market requires assist with all ADL's including feeding. She says only a few words to aid in  communication with family.   Discussed hospital diagnoses, interventions, and underlying co-morbidities. [name] has a good understanding of high aspiration risk (from previous conversations) but has also remained important to continue to allow comfort feeds.  After last hospitalization, the plan was for discharge home with hospice services. Prior to discharge, [name] had changed her mind and requested home health PT on discharge. I asked [name] how the patient has been doing with therapy. [name] feels therapy has been beneficial. She verbalizes understanding her mother will never walk again but feels PT has helped with pivoting from bed to chair.   Hospice and Palliative Care services outpatient were explained and offered. [name] speaks of being surprised that her mother was admitted this time but wanted to visit the ER because she thought she was having a stroke. [name] also speaks of wanting to decrease hospitalizations and IV sticks/blood draws. I educated on hospice philosophy and goal to prevent re-current hospitalizations. [name] is considering hospice services in the future but wishes to continue home health PT on discharge, since she has seen benefit from continued PT. [name] tells me she has [facility] contact information and will notify them when she is ready for hospice services.   Questions and concerns were addressed. PMT contact information  given.     SUMMARY OF RECOMMENDATIONS    DNR  Continue medical management.  Family kindly declines discharge to SNF. They ensure she has 24/7 care at home.  Discussed palliative versus hospice options on discharge. Daughter considering hospice services in the future, but wishes to continue with home health PT on discharge. Daughter has [facility] contact information and tells me she will contact them when ready for hospice services.   Code Status/Advance Care Planning:  DNR  Symptom Management:   Per attending  Palliative Prophylaxis:   Aspiration, Delirium  Protocol, Oral Care and Turn Reposition  Additional Recommendations (Limitations, Scope, Preferences):  Psycho-social/Spiritual:   Desire for further Chaplaincy support: yes  Additional Recommendations: Caregiving  Support/Resources and Education on Hospice  Prognosis:   Unable to determine guarded with underlying dementia, declining functional/cognitive/nutritional status, and recurrent aspiration pneumonia and aspiration risk.   Discharge Planning: Home with Home Health      Primary Diagnoses: Present on Admission: . (Resolved) Altered mental state . Dementia with Lewy bodies . Diabetes mellitus type II, uncontrolled ([place]) . HCAP (healthcare-associated pneumonia) . Protein-calorie malnutrition, severe ([place]) . Unstageable pressure ulcer of sacral region ([place]) . UTI (urinary tract infection) . Acute metabolic encephalopathy   I have reviewed the medical record, interviewed the patient and family, and examined the patient. The following aspects are pertinent.  Past Medical History:  Diagnosis Date  . Dementia   . Diabetes mellitus   . Hyperlipidemia   . Pneumonia 10/2017  . Seizure ([place])   . Sepsis ([place]) 11/24/2017  . Stroke [facility] 2012   possible stroke Jan 2016  . UTI (urinary tract infection) 10/2017   Social History   Socioeconomic History  . Marital status: Widowed    Spouse name: Not on file  . Number of children: 5  . Years of education: 50  . Highest education level: Not on file  Occupational History  . Not on file  Social Needs  . Financial resource strain: Not on file  . Food insecurity:    Worry: Not on file    Inability: Not on file  . Transportation needs:    Medical: Not on file    Non-medical: Not on file  Tobacco Use  . Smoking status: Never Smoker  . Smokeless tobacco: Former Systems developer    Types: Snuff  . Tobacco comment: quit 50-60 years ago  Substance and Sexual Activity  . Alcohol use: No    Alcohol/week: 0.0 oz  . Drug use: No  .  Sexual activity: Not on file  Lifestyle  . Physical activity:    Days per week: Not on file    Minutes per session: Not on file  . Stress: Not on file  Relationships  . Social connections:    Talks on phone: Not on file    Gets together: Not on file    Attends religious service: Not on file    Active member of club or organization: Not on file    Attends meetings of clubs or organizations: Not on file    Relationship status: Not on file  Other Topics Concern  . Not on file  Social History Narrative   Patient is widowed with 5 children   Patient is right handed   Patient has a high school education   Patient drinks 2 cups daily   Family History  Problem Relation Age of Onset  . CAD Mother   . Hypertension Mother   . Diabetes Mother   .  Stroke Mother    Scheduled Meds: . acidophilus  2 capsule Oral TID  . aspirin  81 mg Oral Daily  . budesonide  0.25 mg Nebulization BID  . clopidogrel  75 mg Oral Q breakfast  . enoxaparin (LOVENOX) injection  40 mg Subcutaneous Q24H  . famotidine  20 mg Oral Daily  . feeding supplement (ENSURE ENLIVE)  237 mL Oral BID BM  . ferrous sulfate  325 mg Oral Q breakfast  . guaiFENesin  10 mL Oral QID  . insulin aspart  0-5 Units Subcutaneous QHS  . insulin aspart  0-9 Units Subcutaneous TID WC  . levalbuterol  0.63 mg Nebulization BID  . memantine  14 mg Oral QHS  . multivitamin with minerals  1 tablet Oral Daily  . rosuvastatin  5 mg Oral Q M,W,F  . sodium chloride flush  3 mL Intravenous Q12H  . Valproate Sodium  250 mg Oral BID  . vitamin C  500 mg Oral Daily   Continuous Infusions: . sodium chloride    . ampicillin-sulbactam (UNASYN) IV Stopped (12/08/17 0607)   PRN Meds:.sodium chloride, Melatonin, nystatin, ondansetron, RESOURCE THICKENUP CLEAR, sodium chloride flush Medications Prior to Admission:  Prior to Admission medications   Medication Sig Start Date End Date Taking? Authorizing Provider  acetaminophen (TYLENOL) 325 MG  tablet Take 2 tablets (650 mg total) by mouth every 6 (six) hours as needed for mild pain (or Fever >/= 101). 11/04/17  Yes Georgette Shell, MD  Amino Acids-Protein Hydrolys (FEEDING SUPPLEMENT, PRO-STAT SUGAR FREE 64,) LIQD Take 30 mLs by mouth 4 (four) times daily -  with meals and at bedtime. 10/28/17  Yes Mariel Aloe, MD  aspirin EC 81 MG tablet Take 1 tablet (81 mg total) by mouth daily. 11/17/17 11/17/18 Yes Regalado, Belkys A, MD  budesonide (PULMICORT) 0.25 MG/2ML nebulizer solution Take 2 mLs (0.25 mg total) by nebulization 2 (two) times daily. 11/17/17  Yes Regalado, Belkys A, MD  clopidogrel (PLAVIX) 75 MG tablet Take 1 tablet (75 mg total) by mouth daily with breakfast. 08/15/13  Yes Lavone Orn, MD  famotidine (PEPCID) 20 MG tablet Take 1 tablet (20 mg total) by mouth daily. 10/29/17  Yes Mariel Aloe, MD  ferrous sulfate 325 (65 FE) MG tablet Take 325 mg by mouth daily with breakfast.   Yes [provider]  guaiFENesin (ROBITUSSIN) 100 MG/5ML SOLN Take 10 mLs (200 mg total) by mouth 4 (four) times daily. 11/17/17  Yes Regalado, Belkys A, MD  ibuprofen (ADVIL,MOTRIN) 100 MG/5ML suspension Take 20 mLs (400 mg total) by mouth every 6 (six) hours as needed (prn lef arm pain). 11/17/17  Yes Regalado, Belkys A, MD  Insulin Glargine (TOUJEO MAX SOLOSTAR) 300 UNIT/ML SOPN Inject 5-9 Units into the skin at bedtime.   Yes [provider]  lactobacillus acidophilus (BACID) TABS tablet Take 2 tablets by mouth 3 (three) times daily. 11/17/17  Yes Regalado, Belkys A, MD  levalbuterol (XOPENEX) 0.63 MG/3ML nebulizer solution Take 3 mLs (0.63 mg total) by nebulization 3 (three) times daily. 11/17/17  Yes Regalado, Belkys A, MD  loperamide (IMODIUM A-D) 2 MG tablet Take 1 tablet (2 mg total) by mouth 4 (four) times daily as needed for diarrhea or loose stools. 11/17/17  Yes Regalado, Belkys A, MD  Melatonin 3 MG TABS Take 1 tablet (3 mg total) by mouth at bedtime as needed (sleep).  11/17/17  Yes Regalado, Belkys A, MD  memantine (NAMENDA XR) 14 MG CP24 24 hr capsule  Take 14 mg by mouth at bedtime.    Yes [provider]  Multiple Vitamin (MULTIVITAMIN WITH MINERALS) TABS tablet Take 1 tablet by mouth daily. 11/18/17  Yes Regalado, Belkys A, MD  NYSTATIN powder Apply 1 application topically 2 (two) times daily as needed for rash. 05/04/17  Yes [provider]  ondansetron (ZOFRAN ODT) 4 MG disintegrating tablet Take 1 tablet (4 mg total) by mouth every 8 (eight) hours as needed. 11/23/17  Yes Mabe, Forbes Cellar, MD  protein supplement shake (PREMIER PROTEIN) LIQD Take 59.1 mLs (2 oz total) by mouth 4 (four) times daily -  with meals and at bedtime. 10/28/17  Yes Mariel Aloe, MD  rosuvastatin (CRESTOR) 5 MG tablet Take 5 mg by mouth every Monday, Wednesday, and Friday. At bedtime   Yes [provider]  Valproate Sodium (DEPAKENE) 250 MG/5ML SOLN solution Take 5 mLs (250 mg total) by mouth 2 (two) times daily. 11/17/17  Yes Regalado, Belkys A, MD  vitamin C (ASCORBIC ACID) 500 MG tablet Take 500 mg by mouth daily.   Yes [provider]  zinc oxide (BALMEX) 11.3 % CREA cream Apply 1 application topically daily as needed (for rash).    Yes [provider]  diphenoxylate-atropine (LOMOTIL) 2.5-0.025 MG/5ML liquid Take 5 mLs by mouth 4 (four) times daily as needed for diarrhea or loose stools. Patient not taking: Reported on 12/05/2017 11/17/17   Regalado, Jerald Kief A, MD  insulin aspart (NOVOLOG) 100 UNIT/ML injection Inject 2 Units into the skin 3 (three) times daily with meals. Please inject 2 units if patient eats more than 50 % meals and if Blood sugar above 160 Patient not taking: Reported on 12/05/2017 11/17/17   Regalado, Jerald Kief A, MD  metroNIDAZOLE (FLAGYL) 50 mg/ml oral suspension Take 10 mLs (500 mg total) by mouth 3 (three) times daily. Patient not taking: Reported on 11/23/2017 11/17/17   Regalado, Jerald Kief A, MD  ondansetron (ZOFRAN) 4 MG tablet  Take 1 tablet (4 mg total) by mouth every 6 (six) hours as needed for nausea. Patient not taking: Reported on 12/05/2017 11/17/17   Elmarie Shiley, MD   Allergies  Allergen Reactions  . Lipitor [Atorvastatin] Other (See Comments)    Myalgias    Review of Systems  Unable to perform ROS: Dementia   Physical Exam  Constitutional: She is cooperative. She appears ill.  HENT:  Head: Normocephalic and atraumatic.  Pulmonary/Chest: No accessory muscle usage. No tachypnea. No respiratory distress.  Neurological: She is alert.  disoriented  Skin: Skin is warm and dry.  Psychiatric: Her behavior is normal. Cognition and memory are impaired. She is noncommunicative. She is inattentive.  Nursing note and vitals reviewed.   Vital Signs: BP (!) 123/49 (BP Location: Right Wrist)   Pulse 75   Temp 98.8 F (37.1 C) (Oral)   Resp 18   Ht 5' 2.5" (1.588 m)   Wt 64.9 kg (143 lb 1.6 oz)   SpO2 95%   BMI 25.76 kg/m  Pain Scale: 0-10   Pain Score: 0-No pain   SpO2: SpO2: 95 % O2 Device:SpO2: 95 % O2 Flow Rate: .O2 Flow Rate (L/min): 2 L/min  IO: Intake/output summary:   Intake/Output Summary (Last 24 hours) at 12/08/2017 1108 Last data filed at 12/08/2017 0600 Gross per 24 hour  Intake 330 ml  Output 200 ml  Net 130 ml    LBM: Last BM Date: 12/05/17 Baseline Weight: Weight: 68 kg (150 lb) Most recent weight: Weight: 64.9 kg (  143 lb 1.6 oz)     Palliative Assessment/Data: PPS 30%   Flowsheet Rows     Most Recent Value  Intake Tab  Referral Department  Hospitalist  Unit at Time of Referral  Med/Surg Unit  Palliative Care Primary Diagnosis  Sepsis/Infectious Disease  Palliative Care Type  Return patient Palliative Care  Reason for referral  Clarify Goals of Care  Date first seen by Palliative Care  12/07/17  Clinical Assessment  Palliative Performance Scale Score  30%  Psychosocial & Spiritual Assessment  Palliative Care Outcomes  Patient/Family meeting held?  Yes  Who  was at the meeting?  daughters [name] and [name])  Palliative Care Outcomes  Clarified goals of care, ACP counseling assistance, Counseled regarding hospice, Provided psychosocial or spiritual support, Provided end of life care assistance, Linked to palliative care logitudinal support      Time In: 0845 Time Out: 0940 Time Total:  31mn Greater than 50%  of this time was spent counseling and coordinating care related to the above assessment and plan.  Signed by:  MIhor Dow FNP-C Palliative Medicine Team  Phone: [phone]: [phone]  Please contact Palliative Medicine Team phone at [phone] questions and concerns.  For individual provider: See AShea Evans

## 2017-12-08 NOTE — Plan of Care (Signed)

## 2017-12-08 NOTE — Care Management Important Message (Signed)
Important Message  Patient Details  Name: Megan [name] MRN: [id number] Date of Birth: [birth year]   Medicare Important Message Given:  Yes    Caren MacadamFuller, Sharita Bienaime 12/08/2017, 12:05 PMImportant Message  Patient Details  Name: Megan [name] MRN: [id number] Date of Birth: [birth year]   Medicare Important Message Given:  Yes    Caren MacadamFuller, Jayse Hodkinson 12/08/2017, 12:05 PM

## 2017-12-08 NOTE — Progress Notes (Signed)
[facility]  PROGRESS NOTE  Megan [name] ZOX:[id number] DOB: 1922-02-27 DOA: 12/05/2017 PCP: [name], MD   Brief HPI:    84 yo female with past medical history of seizure disorder, dementia, type 2 diabetes, CVA who presented with altered mental status and somnolence.  She had a coughing episode yesterday morning and was brought to the emergency department.  CT head was negative for acute intracranial abnormality.  Chest x-ray revealed questionable early changes in the right upper lobe.  She was previously hospitalized for aspiration pneumonia     Subjective   Patient seen and examined, denies any pain.  Assessment/Plan:     1. Acute metabolic encephalopathy-likely from dementia, patient is improved and following commands. 2. Dysphagia-patient was evaluated by speech therapy, started on p.o. diet only for comfort.  Palliative care has been consulted 3. Aspiration pneumonia versus H CAP-sepsis ruled out, influenza negative, strep pneumo antigen negative.  Continue IV vancomycin and Unasyn 4. UTI-urine culture growing Klebsiella pneumoniae continue with Unasyn until culture results are available. 5. Acute kidney injury-resolved 6. Diabetes mellitus-continue sliding scale insulin with NovoLog 7. History of CVA-continue aspirin, Plavix, Crestor 8. Seizure disorder-continue Depakote 9. Dementia-continue Namenda    DVT prophylaxis: Lovenox  Code Status: Lovenox  Family Communication: Discussed with daughter at bedside  Disposition Plan: SNF   Consultants:  None   Procedures:  None    Antibiotics:   Anti-infectives (From admission, onward)   Start     Dose/Rate Route Frequency Ordered Stop   12/07/17 2200  vancomycin (VANCOCIN) IVPB 1000 mg/200 mL premix  Status:  Discontinued     1,000 mg 200 mL/hr over 60 Minutes Intravenous Every 48 hours 12/05/17 2056 12/06/17 1920   12/06/17 1400  ampicillin-sulbactam (UNASYN) 1.5 g in sodium chloride 0.9 % 100 mL IVPB      1.5 g 200 mL/hr over 30 Minutes Intravenous Every 8 hours 12/06/17 1002     12/05/17 2200  ceFEPIme (MAXIPIME) 1 g in sodium chloride 0.9 % 100 mL IVPB  Status:  Discontinued     1 g 200 mL/hr over 30 Minutes Intravenous Every 24 hours 12/05/17 2057 12/06/17 1000   12/05/17 2000  vancomycin (VANCOCIN) 1,500 mg in sodium chloride 0.9 % 500 mL IVPB     1,500 mg 250 mL/hr over 120 Minutes Intravenous STAT 12/05/17 1958 12/05/17 2304   12/05/17 1745  Ampicillin-Sulbactam (UNASYN) 3 g in sodium chloride 0.9 % 100 mL IVPB     3 g 200 mL/hr over 30 Minutes Intravenous  Once 12/05/17 1743 12/05/17 2101       Objective   Vitals:   12/07/17 2202 12/08/17 0448 12/08/17 0800 12/08/17 1304  BP:  (!) 123/49  (!) 130/51  Pulse:  75  70  Resp:  18  16  Temp:  98.8 F (37.1 C)  98.7 F (37.1 C)  TempSrc:  Oral  Oral  SpO2: 94% 95% 95%   Weight:      Height:        Intake/Output Summary (Last 24 hours) at 12/08/2017 1340 Last data filed at 12/08/2017 1000 Gross per 24 hour  Intake 550 ml  Output 200 ml  Net 350 ml   Filed Weights   12/05/17 1448 12/06/17 0000  Weight: 68 kg (150 lb) 64.9 kg (143 lb 1.6 oz)     Physical Examination:     Physical Exam: Eyes: No icterus, extraocular muscles intact  Mouth: Oral mucosa is moist, no lesions on palate,  Neck: Supple,  no deformities, masses, or tenderness Lungs: Normal respiratory effort, bilateral clear to auscultation, no crackles or wheezes.  Heart: Regular rate and rhythm, S1 and S2 normal, no murmurs, rubs auscultated Abdomen: BS normoactive,soft,nondistended,non-tender to palpation,no organomegaly Extremities: No pretibial edema, no erythema, no cyanosis, no clubbing Neuro : Alert and following commands       Data Reviewed: I have personally reviewed following labs and imaging studies  CBG: Recent Labs  Lab 12/07/17 1207 12/07/17 1633 12/07/17 2226 12/08/17 0735 12/08/17 1139  GLUCAP 156* 152* 148* 118* 152*     CBC: Recent Labs  Lab 12/03/17 2007 12/05/17 1553 12/06/17 0533 12/07/17 0509  WBC 10.0 8.6 6.8 8.0  NEUTROABS  --  4.7  --   --   HGB 10.7* 10.7* 10.3* 9.9*  HCT 34.8* 35.0* 34.3* 31.4*  MCV 92.8 94.1 95.5 92.6  PLT 218 246 236 238    Basic Metabolic Panel: Recent Labs  Lab 12/03/17 2007 12/05/17 1553 12/06/17 0533 12/07/17 0509  NA 140 142 140 141  K 4.3 4.2 4.1 3.8  CL 108 109 111 111  CO2 24 24 21* 22  GLUCOSE 134* 156* 134* 121*  BUN 27* 25* 17 11  CREATININE 1.17* 1.13* 0.81 0.73  CALCIUM 9.4 9.4 8.9 8.8*    Recent Results (from the past 240 hour(s))  Urine culture     Status: Abnormal (Preliminary result)   Collection Time: 12/05/17  3:53 PM  Result Value Ref Range Status   Specimen Description   Final    URINE, CATHETERIZED Performed at [facility], [address] [place], [place] [address]    Special Requests   Final    URINE, CATHETERIZED Performed at [facility], [address] [place], [place] [address]    Culture (A)  Final    >=100,000 COLONIES/mL KLEBSIELLA PNEUMONIAE REPEATING SUSCEPTIBILITIES Performed at [facility], [address] [place], [place] [address]    Report Status PENDING  Incomplete  Culture, blood (routine x 2)     Status: None (Preliminary result)   Collection Time: 12/05/17  5:44 PM  Result Value Ref Range Status   Specimen Description   Final    BLOOD RIGHT ANTECUBITAL Performed at [facility], [address] [place], [place] [address]    Special Requests   Final    BOTTLES DRAWN AEROBIC AND ANAEROBIC Blood Culture results may not be optimal due to an excessive volume of blood received in culture bottles Performed at [facility], [address] [place], [place] [address]    Culture   Final    NO GROWTH 2 DAYS Performed at [facility], [address] [place], [place] [address]    Report Status PENDING  Incomplete   Culture, blood (routine x 2)     Status: None (Preliminary result)   Collection Time: 12/05/17  7:21 PM  Result Value Ref Range Status   Specimen Description   Final    BLOOD BLOOD LEFT HAND Performed at [facility], [address] [place], [place] [address]    Special Requests   Final    BOTTLES DRAWN AEROBIC ONLY Blood Culture results may not be optimal due to an inadequate volume of blood received in culture bottles Performed at [facility], [address] [place], [place] [address]    Culture   Final    NO GROWTH 2 DAYS Performed at [facility], [address] [place], [place] [address]    Report Status PENDING  Incomplete  MRSA PCR Screening     Status: None   Collection Time: 12/06/17 10:00 AM  Result Value Ref Range Status   MRSA by PCR NEGATIVE NEGATIVE Final    Comment:        The GeneXpert MRSA Assay (FDA approved for NASAL specimens only), is one component of a comprehensive MRSA colonization surveillance program. It is not intended to diagnose MRSA infection nor to guide or monitor treatment for MRSA infections. Performed at [facility], [address] [place], [place] [address]      Liver Function Tests: Recent Labs  Lab 12/05/17 1553 12/06/17 0533  AST 16 16  ALT 8* 8*  ALKPHOS 88 74  BILITOT 0.3 0.4  PROT 7.1 6.4*  ALBUMIN 2.4* 2.2*   No results for input(s): LIPASE, AMYLASE in the last 168 hours. Recent Labs  Lab 12/05/17 1553  AMMONIA 23    Cardiac Enzymes: No results for input(s): CKTOTAL, CKMB, CKMBINDEX, TROPONINI in the last 168 hours. BNP (last 3 results) Recent Labs    11/11/17 1633  BNP 77.6    ProBNP (last 3 results) No results for input(s): PROBNP in the last 8760 hours.    Studies: No results found.  Scheduled Meds: . acidophilus  2 capsule Oral TID  . aspirin  81 mg Oral Daily  . budesonide  0.25 mg Nebulization BID  . clopidogrel  75 mg Oral Q  breakfast  . enoxaparin (LOVENOX) injection  40 mg Subcutaneous Q24H  . famotidine  20 mg Oral Daily  . feeding supplement (ENSURE ENLIVE)  237 mL Oral BID BM  . ferrous sulfate  325 mg Oral Q breakfast  . guaiFENesin  10 mL Oral QID  . insulin aspart  0-5 Units Subcutaneous QHS  . insulin aspart  0-9 Units Subcutaneous TID WC  . levalbuterol  0.63 mg Nebulization BID  . memantine  14 mg Oral QHS  . multivitamin with minerals  1 tablet Oral Daily  . rosuvastatin  5 mg Oral Q M,W,F  . sodium chloride flush  3 mL Intravenous Q12H  . Valproate Sodium  250 mg Oral BID  . vitamin C  500 mg Oral Daily      Time spent: 20 min  Meredeth Ide   [facility] Pager [phone]. If 7PM-7AM, please contact night-coverage at [email or web address], Office  [phone]  password [email or web address]  12/08/2017, 1:40 PM  LOS: 3 days

## 2017-12-09 ENCOUNTER — Inpatient Hospital Stay (HOSPITAL_COMMUNITY): Payer: Medicare Other

## 2017-12-09 DIAGNOSIS — M7989 Other specified soft tissue disorders: Secondary | ICD-10-CM

## 2017-12-09 LAB — GLUCOSE, CAPILLARY
GLUCOSE-CAPILLARY: 116 mg/dL — AB (ref 65–99)
GLUCOSE-CAPILLARY: 133 mg/dL — AB (ref 65–99)
Glucose-Capillary: 193 mg/dL — ABNORMAL HIGH (ref 65–99)
Glucose-Capillary: 150 mg/dL — ABNORMAL HIGH (ref 65–99)

## 2017-12-09 LAB — URINE CULTURE: Culture: >=100000 — AB

## 2017-12-09 MED ORDER — SODIUM CHLORIDE 0.9 % IV SOLN
1.0000 g | Freq: Two times a day (BID) | INTRAVENOUS | Status: DC
  Administered 2017-12-09 – 2017-12-12 (×6): 1 g via INTRAVENOUS
  Filled 2017-12-09 (×7): qty 1

## 2017-12-09 MED ORDER — SODIUM CHLORIDE 0.9 % IV SOLN
1.0000 g | Freq: Once | INTRAVENOUS | Status: AC
  Administered 2017-12-09: 1 g via INTRAVENOUS
  Filled 2017-12-09: qty 1

## 2017-12-09 MED ORDER — LEVETIRACETAM 500 MG PO TABS
500.0000 mg | ORAL_TABLET | Freq: Two times a day (BID) | ORAL | Status: DC
  Administered 2017-12-09 – 2017-12-11 (×4): 500 mg via ORAL
  Filled 2017-12-09 (×5): qty 1

## 2017-12-09 NOTE — Plan of Care (Signed)
  Problem: Nutrition: Goal: Adequate nutrition will be maintained Outcome: Progressing   Problem: Skin Integrity: Goal: Risk for impaired skin integrity will decrease Outcome: Progressing   

## 2017-12-09 NOTE — Progress Notes (Signed)
Left upper extremity venous duplex completed. Preliminary results- There is no evidence of a DVT. There is a superficial thrombus of the cephalic vein as noted before with no significant change. [employer]ia Xzavien [name], RVS 12/09/2017 3:13 pm

## 2017-12-09 NOTE — Progress Notes (Signed)
Report received from Amy Abernathy,RN. No change in assessment. Megan [name] 

## 2017-12-09 NOTE — Progress Notes (Signed)
Physical Therapy Treatment Patient Details Name: Megan [name] MRN: [id number] DOB: 09-Jun-1922 Today's Date: 12/09/2017    History of Present Illness Pt is a 84 year old female who presented with altered mental status and somnolence and admitted for acute metabolic encephalopathy and had recent admission for aspiration pneumonia  .PMH including but not limited to dementia, CVA with R sided weakness, DM and HTN.     PT Comments    Pt in bed with daughter in room.  "She's been sleeping all day".  Lunch tray at bedside untouched.  Attempted to arouse but only briefly open eyes.  General bed mobility comments: assist for upper and lower body, utilized bed pad for positioning to upright EOB static sitting required Max assist to maintain.  General transfer comment: "Bear Hug" squat pivot 1/4 turn towards pt L from elevated bed to recliner Pt 0%  Positioned in recliner upright to increase stimulation.     Follow Up Recommendations  SNF;Supervision/Assistance - 24 hour     Equipment Recommendations  None recommended by PT    Recommendations for Other Services       Precautions / Restrictions Precautions Precautions: Fall Precaution Comments: old CVA R hemiparesis Restrictions Weight Bearing Restrictions: No Other Position/Activity Restrictions: WBAT    Mobility  Bed Mobility Overal bed mobility: Needs Assistance Bed Mobility: Supine to Sit     Supine to sit: Total assist;+2 for physical assistance(pt 0%)     General bed mobility comments: assist for upper and lower body, utilized bed pad for positioning to upright EOB static sitting required Max assist to maintain  Transfers Overall transfer level: Needs assistance Equipment used: None Transfers: Squat Pivot Transfers     Squat pivot transfers: Total assist;+2 safety/equipment     General transfer comment: "Bear Hug" squat pivot 1/4 turn towards pt L from elevated bed to recliner Pt 0%  Ambulation/Gait                  Stairs             Wheelchair Mobility    Modified Rankin (Stroke Patients Only)       Balance                                            Cognition Arousal/Alertness: Lethargic Behavior During Therapy: Flat affect                                   General Comments: <25% alertness      75% sleepy/groggy      Exercises      General Comments        Pertinent Vitals/Pain Pain Assessment: No/denies pain    Home Living                      Prior Function            PT Goals (current goals can now be found in the care plan section) Progress towards PT goals: Progressing toward goals    Frequency    Min 3X/week      PT Plan Current plan remains appropriate    Co-evaluation              AM-PAC PT "6 Clicks" Daily Activity  Outcome Measure  Difficulty turning over  in bed (including adjusting bedclothes, sheets and blankets)?: Unable Difficulty moving from lying on back to sitting on the side of the bed? : Unable Difficulty sitting down on and standing up from a chair with arms (e.g., wheelchair, bedside commode, etc,.)?: Unable Help needed moving to and from a bed to chair (including a wheelchair)?: Total Help needed walking in hospital room?: Total Help needed climbing 3-5 steps with a railing? : Total 6 Click Score: 6    End of Session   Activity Tolerance: Other (comment)(lethargic) Patient left: in chair;with chair alarm set;with family/visitor present Nurse Communication: Mobility status;Need for lift equipment PT Visit Diagnosis: Other abnormalities of gait and mobility (R26.89)     Time: [id number] PT Time Calculation (min) (ACUTE ONLY): 15 min  Charges:  $Therapeutic Activity: 8-22 mins                    G Codes:       Felecia ShellingLori Rayhaan Huster  PTA [facility]  Acute  Rehab Pager      [phone]

## 2017-12-09 NOTE — Progress Notes (Signed)
  Speech Language Pathology Treatment: Dysphagia  Patient Details Name: Megan [name] MRN: [id number] DOB: 04-24-1922 Today's Date: 12/09/2017 Time: 1020-1039 SLP Time Calculation (min) (ACUTE ONLY): 19 min  Assessment / Plan / Recommendation Clinical Impression  Daughter Norva Pavlov"), who is caregiver, present at bedside feeding her mother breakfast.  [name] is perceptive about her mother's waxing and waning swallowing abilities.  We reviewed again the trajectory of dementia as it relates to swallowing.  She verbalizes understanding, demonstrates ability to feed her mother carefully and with good attention. Pt demonstrated generally protective swallow this morning.  Fraser Din is planning to take her mother home and resume [place] services, including ST and PT. She has met with Palliative Medicine and is deferring decision re: hospice.  No further acute SLP needs are identified, now that education is complete.  Our services will respectfully sign off.    HPI HPI: [name] who is presenting from home for worsening mental status, nausea vomiting, diarrhea, possible aspiration by reported her daughter.  PMH + for dementia, recent ABX use causing nausea/vomiting, dehydration and poor po intake.  Pt resides with family.  Swallow evaluation ordered.  Of note, pt was to possibly be seen by home hospice previously but family determined to continue [facility] PT/ST and nursing.  Pt now admitted with AMS- Clarene Critchley daughter present.       SLP Plan  All goals met       Recommendations  Diet recommendations: Nectar-thick liquid Liquids provided via: Cup;Straw Medication Administration: Crushed with puree Supervision: Full supervision/cueing for compensatory strategies;Trained caregiver to feed patient Compensations: Slow rate;Small sips/bites Postural Changes and/or Swallow Maneuvers: Seated upright 90 degrees                Plan: All goals met       GO                Juan Quam  Laurice 12/09/2017, 10:42 AM

## 2017-12-09 NOTE — Progress Notes (Signed)
[facility]  PROGRESS NOTE  Omar PersonRuby Strothers JXB:[id number] DOB: Jul 22, 1922 DOA: 12/05/2017 PCP: [name], John, MD   Brief HPI:    84 yo female with past medical history of seizure disorder, dementia, type 2 diabetes, CVA who presented with altered mental status and somnolence.  She had a coughing episode yesterday morning and was brought to the emergency department.  CT head was negative for acute intracranial abnormality.  Chest x-ray revealed questionable early changes in the right upper lobe.  She was previously hospitalized for aspiration pneumonia     Subjective   Patient seen and examined, urine culture growing ESBL Klebsiella.   Assessment/Plan:     1. Acute metabolic encephalopathy-likely from dementia, patient is improved and following commands. 2. Dysphagia-patient was evaluated by speech therapy, started on p.o. diet only for comfort.  Palliative care has been consulted 3. Aspiration pneumonia versus H CAP-sepsis ruled out, influenza negative, strep pneumo antigen negative.  Continue IV vancomycin and Unasyn 4. UTI-urine culture growing ESBL Klebsiella pneumoniae, will start Meropenem. 5. Acute kidney injury-resolved 6. Diabetes mellitus-continue sliding scale insulin with NovoLog 7. History of CVA-continue aspirin, Plavix, Crestor 8. Seizure disorder-continue Depakote, will add Keppra 500 mg po bid till patient is on meropenem as it can lower seizure threshold. 9. Dementia-continue Namenda    DVT prophylaxis: Lovenox  Code Status: Lovenox  Family Communication: Discussed with daughter at bedside  Disposition Plan: SNF   Consultants:  None   Procedures:  None    Antibiotics:   Anti-infectives (From admission, onward)   Start     Dose/Rate Route Frequency Ordered Stop   12/09/17 2300  meropenem (MERREM) 1 g in sodium chloride 0.9 % 100 mL IVPB     1 g 200 mL/hr over 30 Minutes Intravenous Every 12 hours 12/09/17 1551     12/09/17 1600  meropenem  (MERREM) 1 g in sodium chloride 0.9 % 100 mL IVPB     1 g 200 mL/hr over 30 Minutes Intravenous  Once 12/09/17 1548     12/07/17 2200  vancomycin (VANCOCIN) IVPB 1000 mg/200 mL premix  Status:  Discontinued     1,000 mg 200 mL/hr over 60 Minutes Intravenous Every 48 hours 12/05/17 2056 12/06/17 1920   12/06/17 1400  ampicillin-sulbactam (UNASYN) 1.5 g in sodium chloride 0.9 % 100 mL IVPB  Status:  Discontinued     1.5 g 200 mL/hr over 30 Minutes Intravenous Every 8 hours 12/06/17 1002 12/09/17 1548   12/05/17 2200  ceFEPIme (MAXIPIME) 1 g in sodium chloride 0.9 % 100 mL IVPB  Status:  Discontinued     1 g 200 mL/hr over 30 Minutes Intravenous Every 24 hours 12/05/17 2057 12/06/17 1000   12/05/17 2000  vancomycin (VANCOCIN) 1,500 mg in sodium chloride 0.9 % 500 mL IVPB     1,500 mg 250 mL/hr over 120 Minutes Intravenous STAT 12/05/17 1958 12/05/17 2304   12/05/17 1745  Ampicillin-Sulbactam (UNASYN) 3 g in sodium chloride 0.9 % 100 mL IVPB     3 g 200 mL/hr over 30 Minutes Intravenous  Once 12/05/17 1743 12/05/17 2101       Objective   Vitals:   12/09/17 0443 12/09/17 0833 12/09/17 0840 12/09/17 1307  BP: (!) 132/55   (!) 135/54  Pulse: 70   71  Resp: 18   16  Temp: 98.7 F (37.1 C)   98.9 F (37.2 C)  TempSrc: Oral   Oral  SpO2: 93% 93% 93% 98%  Weight:  Height:        Intake/Output Summary (Last 24 hours) at 12/09/2017 1553 Last data filed at 12/09/2017 1524 Gross per 24 hour  Intake 420 ml  Output 825 ml  Net -405 ml   Filed Weights   12/05/17 1448 12/06/17 0000  Weight: 68 kg (150 lb) 64.9 kg (143 lb 1.6 oz)     Physical Examination:    Physical Exam: Eyes: No icterus, extraocular muscles intact  Mouth: Oral mucosa is moist, no lesions on palate,  Neck: Supple, no deformities, masses, or tenderness Lungs: Normal respiratory effort, bilateral clear to auscultation, no crackles or wheezes.  Heart: Regular rate and rhythm, S1 and S2 normal, no murmurs,  rubs auscultated Abdomen: BS normoactive,soft,nondistended,non-tender to palpation,no organomegaly Extremities: No pretibial edema, no erythema, no cyanosis, no clubbing Neuro : Alert       Data Reviewed: I have personally reviewed following labs and imaging studies  CBG: Recent Labs  Lab 12/08/17 1139 12/08/17 1737 12/08/17 2033 12/09/17 0742 12/09/17 1133  GLUCAP 152* 135* 132* 116* 193*    CBC: Recent Labs  Lab 12/03/17 2007 12/05/17 1553 12/06/17 0533 12/07/17 0509  WBC 10.0 8.6 6.8 8.0  NEUTROABS  --  4.7  --   --   HGB 10.7* 10.7* 10.3* 9.9*  HCT 34.8* 35.0* 34.3* 31.4*  MCV 92.8 94.1 95.5 92.6  PLT 218 246 236 238    Basic Metabolic Panel: Recent Labs  Lab 12/03/17 2007 12/05/17 1553 12/06/17 0533 12/07/17 0509  NA 140 142 140 141  K 4.3 4.2 4.1 3.8  CL 108 109 111 111  CO2 24 24 21* 22  GLUCOSE 134* 156* 134* 121*  BUN 27* 25* 17 11  CREATININE 1.17* 1.13* 0.81 0.73  CALCIUM 9.4 9.4 8.9 8.8*    Recent Results (from the past 240 hour(s))  Urine culture     Status: Abnormal   Collection Time: 12/05/17  3:53 PM  Result Value Ref Range Status   Specimen Description   Final    URINE, CATHETERIZED Performed at [facility], [address], [place], [place] [address]    Special Requests   Final    URINE, CATHETERIZED Performed at [facility], [address], [place], [place] [address]    Culture (A)  Final    >=100,000 COLONIES/mL KLEBSIELLA PNEUMONIAE Confirmed Extended Spectrum Beta-Lactamase Producer (ESBL).  In bloodstream infections from ESBL organisms, carbapenems are preferred over piperacillin/tazobactam. They are shown to have a lower risk of mortality. Performed at [facility], [address] [place], [place] [address]    Report Status 12/09/2017 FINAL  Final   Organism ID, Bacteria KLEBSIELLA PNEUMONIAE (A)  Final      Susceptibility   Klebsiella pneumoniae - MIC*    AMPICILLIN >=32  RESISTANT Resistant     CEFAZOLIN >=64 RESISTANT Resistant     CEFTRIAXONE >=64 RESISTANT Resistant     CIPROFLOXACIN >=4 RESISTANT Resistant     GENTAMICIN <=1 SENSITIVE Sensitive     IMIPENEM <=0.25 SENSITIVE Sensitive     NITROFURANTOIN 256 RESISTANT Resistant     TRIMETH/SULFA >=320 RESISTANT Resistant     AMPICILLIN/SULBACTAM >=32 RESISTANT Resistant     PIP/TAZO >=128 RESISTANT Resistant     Extended ESBL POSITIVE Resistant     * >=100,000 COLONIES/mL KLEBSIELLA PNEUMONIAE  Culture, blood (routine x 2)     Status: None (Preliminary result)   Collection Time: 12/05/17  5:44 PM  Result Value Ref Range Status  Specimen Description   Final    BLOOD RIGHT ANTECUBITAL Performed at [facility], [address], [place], [place] [address]    Special Requests   Final    BOTTLES DRAWN AEROBIC AND ANAEROBIC Blood Culture results may not be optimal due to an excessive volume of blood received in culture bottles Performed at [facility], [address], [place], [place] [address]    Culture   Final    NO GROWTH 4 DAYS Performed at [facility], [address] [place], [place] [address]    Report Status PENDING  Incomplete  Culture, blood (routine x 2)     Status: None (Preliminary result)   Collection Time: 12/05/17  7:21 PM  Result Value Ref Range Status   Specimen Description   Final    BLOOD BLOOD LEFT HAND Performed at [facility], [address] [place], [place] [address]    Special Requests   Final    BOTTLES DRAWN AEROBIC ONLY Blood Culture results may not be optimal due to an inadequate volume of blood received in culture bottles Performed at [facility], [address] [place], [place] [address]    Culture   Final    NO GROWTH 4 DAYS Performed at [facility], [address] [place], [place] [address]    Report Status PENDING  Incomplete  MRSA PCR Screening     Status: None    Collection Time: 12/06/17 10:00 AM  Result Value Ref Range Status   MRSA by PCR NEGATIVE NEGATIVE Final    Comment:        The GeneXpert MRSA Assay (FDA approved for NASAL specimens only), is one component of a comprehensive MRSA colonization surveillance program. It is not intended to diagnose MRSA infection nor to guide or monitor treatment for MRSA infections. Performed at [facility], [address], [place], [place] [address]      Liver Function Tests: Recent Labs  Lab 12/05/17 1553 12/06/17 0533  AST 16 16  ALT 8* 8*  ALKPHOS 88 74  BILITOT 0.3 0.4  PROT 7.1 6.4*  ALBUMIN 2.4* 2.2*   No results for input(s): LIPASE, AMYLASE in the last 168 hours. Recent Labs  Lab 12/05/17 1553  AMMONIA 23    Cardiac Enzymes: No results for input(s): CKTOTAL, CKMB, CKMBINDEX, TROPONINI in the last 168 hours. BNP (last 3 results) Recent Labs    11/11/17 1633  BNP 77.6    ProBNP (last 3 results) No results for input(s): PROBNP in the last 8760 hours.    Studies: No results found.  Scheduled Meds: . acidophilus  2 capsule Oral TID  . aspirin  81 mg Oral Daily  . budesonide  0.25 mg Nebulization BID  . clopidogrel  75 mg Oral Q breakfast  . enoxaparin (LOVENOX) injection  40 mg Subcutaneous Q24H  . famotidine  20 mg Oral Daily  . feeding supplement (ENSURE ENLIVE)  237 mL Oral BID BM  . ferrous sulfate  325 mg Oral Q breakfast  . guaiFENesin  10 mL Oral QID  . insulin aspart  0-5 Units Subcutaneous QHS  . insulin aspart  0-9 Units Subcutaneous TID WC  . levalbuterol  0.63 mg Nebulization BID  . levETIRAcetam  500 mg Oral BID  . memantine  14 mg Oral QHS  . multivitamin with minerals  1 tablet Oral Daily  . rosuvastatin  5 mg Oral Q M,W,F  .  sodium chloride flush  3 mL Intravenous Q12H  . Valproate Sodium  250 mg Oral BID  . vitamin C  500 mg Oral Daily      Time spent: 20 min  Meredeth Ide   [facility] Pager [phone]. If  7PM-7AM, please contact night-coverage at [email or web address], Office  [phone]  password [email or web address]  12/09/2017, 3:53 PM  LOS: 4 days

## 2017-12-10 LAB — CULTURE, BLOOD (ROUTINE X 2)
CULTURE: NO GROWTH
Culture: NO GROWTH

## 2017-12-10 LAB — GLUCOSE, CAPILLARY
Glucose-Capillary: 129 mg/dL — ABNORMAL HIGH (ref 65–99)
Glucose-Capillary: 144 mg/dL — ABNORMAL HIGH (ref 65–99)
Glucose-Capillary: 230 mg/dL — ABNORMAL HIGH (ref 65–99)
Glucose-Capillary: 187 mg/dL — ABNORMAL HIGH (ref 65–99)

## 2017-12-10 NOTE — Progress Notes (Signed)
[facility]  PROGRESS NOTE  Megan [name] ZOX:[id number] DOB: March 27, 1922 DOA: 12/05/2017 PCP: [name], MD   Brief HPI:    84 yo female with past medical history of seizure disorder, dementia, type 2 diabetes, CVA who presented with altered mental status and somnolence.  She had a coughing episode yesterday morning and was brought to the emergency department.  CT head was negative for acute intracranial abnormality.  Chest x-ray revealed questionable early changes in the right upper lobe.  She was previously hospitalized for aspiration pneumonia     Subjective   Patient seen and examined, no new complaints.  Started on meropenem for ESBL Klebsiella   Assessment/Plan:     1. Acute metabolic encephalopathy-likely from dementia, patient is improved and following commands. 2. Dysphagia-patient was evaluated by speech therapy, started on p.o. diet only for comfort.  Palliative care has been consulted 3. Aspiration pneumonia versus H CAP-sepsis ruled out, influenza negative, strep pneumo antigen negative. 4. UTI-urine culture growing ESBL Klebsiella pneumoniae, started on t Meropenem. 5. Acute kidney injury-resolved 6. Diabetes mellitus-continue sliding scale insulin with NovoLog 7. History of CVA-continue aspirin, Plavix, Crestor 8. Seizure disorder-continue Depakote, will add Keppra 500 mg po bid till patient is on meropenem as it can lower seizure threshold. 9. Dementia-continue Namenda    DVT prophylaxis: Lovenox  Code Status: Lovenox  Family Communication: Discussed with daughter at bedside  Disposition Plan: SNF   Consultants:  None   Procedures:  None    Antibiotics:   Anti-infectives (From admission, onward)   Start     Dose/Rate Route Frequency Ordered Stop   12/09/17 2300  meropenem (MERREM) 1 g in sodium chloride 0.9 % 100 mL IVPB     1 g 200 mL/hr over 30 Minutes Intravenous Every 12 hours 12/09/17 1551     12/09/17 1600  meropenem (MERREM) 1 g  in sodium chloride 0.9 % 100 mL IVPB     1 g 200 mL/hr over 30 Minutes Intravenous  Once 12/09/17 1548 12/09/17 1729   12/07/17 2200  vancomycin (VANCOCIN) IVPB 1000 mg/200 mL premix  Status:  Discontinued     1,000 mg 200 mL/hr over 60 Minutes Intravenous Every 48 hours 12/05/17 2056 12/06/17 1920   12/06/17 1400  ampicillin-sulbactam (UNASYN) 1.5 g in sodium chloride 0.9 % 100 mL IVPB  Status:  Discontinued     1.5 g 200 mL/hr over 30 Minutes Intravenous Every 8 hours 12/06/17 1002 12/09/17 1548   12/05/17 2200  ceFEPIme (MAXIPIME) 1 g in sodium chloride 0.9 % 100 mL IVPB  Status:  Discontinued     1 g 200 mL/hr over 30 Minutes Intravenous Every 24 hours 12/05/17 2057 12/06/17 1000   12/05/17 2000  vancomycin (VANCOCIN) 1,500 mg in sodium chloride 0.9 % 500 mL IVPB     1,500 mg 250 mL/hr over 120 Minutes Intravenous STAT 12/05/17 1958 12/05/17 2304   12/05/17 1745  Ampicillin-Sulbactam (UNASYN) 3 g in sodium chloride 0.9 % 100 mL IVPB     3 g 200 mL/hr over 30 Minutes Intravenous  Once 12/05/17 1743 12/05/17 2101       Objective   Vitals:   12/09/17 2042 12/10/17 0435 12/10/17 0849 12/10/17 1500  BP: (!) 156/59 (!) 140/58    Pulse: 71 61  86  Resp: 20 18  18   Temp: 98.4 F (36.9 C) 98.2 F (36.8 C)  97.6 F (36.4 C)  TempSrc: Oral   Oral  SpO2: 94% 98% 98% 97%  Weight:  Height:        Intake/Output Summary (Last 24 hours) at 12/10/2017 1837 Last data filed at 12/10/2017 1211 Gross per 24 hour  Intake 100 ml  Output 800 ml  Net -700 ml   Filed Weights   12/05/17 1448 12/06/17 0000  Weight: 68 kg (150 lb) 64.9 kg (143 lb 1.6 oz)     Physical Examination:  Physical Exam:  Mouth: Oral mucosa is moist, no lesions on palate,  Neck: Supple, no deformities, masses, or tenderness Lungs: Normal respiratory effort, bilateral clear to auscultation, no crackles or wheezes.  Heart: Regular rate and rhythm, S1 and S2 normal, no murmurs, rubs auscultated Abdomen: BS  normoactive,soft,nondistended,non-tender to palpation,no organomegaly Extremities: No pretibial edema, no erythema, no cyanosis, no clubbing Neuro : Alert       Data Reviewed: I have personally reviewed following labs and imaging studies  CBG: Recent Labs  Lab 12/09/17 1711 12/09/17 2050 12/10/17 0815 12/10/17 1144 12/10/17 1652  GLUCAP 133* 150* 129* 144* 230*    CBC: Recent Labs  Lab 12/03/17 2007 12/05/17 1553 12/06/17 0533 12/07/17 0509  WBC 10.0 8.6 6.8 8.0  NEUTROABS  --  4.7  --   --   HGB 10.7* 10.7* 10.3* 9.9*  HCT 34.8* 35.0* 34.3* 31.4*  MCV 92.8 94.1 95.5 92.6  PLT 218 246 236 238    Basic Metabolic Panel: Recent Labs  Lab 12/03/17 2007 12/05/17 1553 12/06/17 0533 12/07/17 0509  NA 140 142 140 141  K 4.3 4.2 4.1 3.8  CL 108 109 111 111  CO2 24 24 21* 22  GLUCOSE 134* 156* 134* 121*  BUN 27* 25* 17 11  CREATININE 1.17* 1.13* 0.81 0.73  CALCIUM 9.4 9.4 8.9 8.8*    Recent Results (from the past 240 hour(s))  Urine culture     Status: Abnormal   Collection Time: 12/05/17  3:53 PM  Result Value Ref Range Status   Specimen Description   Final    URINE, CATHETERIZED Performed at [facility], [address] [place], [place] [address]    Special Requests   Final    URINE, CATHETERIZED Performed at [facility], [address] [place], [place] [address]    Culture (A)  Final    >=100,000 COLONIES/mL KLEBSIELLA PNEUMONIAE Confirmed Extended Spectrum Beta-Lactamase Producer (ESBL).  In bloodstream infections from ESBL organisms, carbapenems are preferred over piperacillin/tazobactam. They are shown to have a lower risk of mortality. Performed at [facility], [address] [place], [place] [address]    Report Status 12/09/2017 FINAL  Final   Organism ID, Bacteria KLEBSIELLA PNEUMONIAE (A)  Final      Susceptibility   Klebsiella pneumoniae - MIC*    AMPICILLIN >=32 RESISTANT Resistant      CEFAZOLIN >=64 RESISTANT Resistant     CEFTRIAXONE >=64 RESISTANT Resistant     CIPROFLOXACIN >=4 RESISTANT Resistant     GENTAMICIN <=1 SENSITIVE Sensitive     IMIPENEM <=0.25 SENSITIVE Sensitive     NITROFURANTOIN 256 RESISTANT Resistant     TRIMETH/SULFA >=320 RESISTANT Resistant     AMPICILLIN/SULBACTAM >=32 RESISTANT Resistant     PIP/TAZO >=128 RESISTANT Resistant     Extended ESBL POSITIVE Resistant     * >=100,000 COLONIES/mL KLEBSIELLA PNEUMONIAE  Culture, blood (routine x 2)     Status: None   Collection Time: 12/05/17  5:44 PM  Result Value Ref Range Status   Specimen Description   Final  BLOOD RIGHT ANTECUBITAL Performed at [facility] [address], [place], [place] [address]    Special Requests   Final    BOTTLES DRAWN AEROBIC AND ANAEROBIC Blood Culture results may not be optimal due to an excessive volume of blood received in culture bottles Performed at [facility], [address] [place], [place] [address]    Culture   Final    NO GROWTH 5 DAYS Performed at [facility], [address] [place], [place] [address]    Report Status 12/10/2017 FINAL  Final  Culture, blood (routine x 2)     Status: None   Collection Time: 12/05/17  7:21 PM  Result Value Ref Range Status   Specimen Description   Final    BLOOD BLOOD LEFT HAND Performed at [facility], [address], [place], [place] [address]    Special Requests   Final    BOTTLES DRAWN AEROBIC ONLY Blood Culture results may not be optimal due to an inadequate volume of blood received in culture bottles Performed at [facility], [address], [place], [place] [address]    Culture   Final    NO GROWTH 5 DAYS Performed at [facility], [address], [place], [place] [address]    Report Status 12/10/2017 FINAL  Final  MRSA PCR Screening     Status: None   Collection Time: 12/06/17 10:00 AM  Result Value Ref  Range Status   MRSA by PCR NEGATIVE NEGATIVE Final    Comment:        The GeneXpert MRSA Assay (FDA approved for NASAL specimens only), is one component of a comprehensive MRSA colonization surveillance program. It is not intended to diagnose MRSA infection nor to guide or monitor treatment for MRSA infections. Performed at [facility], [address] [place], [place] [address]      Liver Function Tests: Recent Labs  Lab 12/05/17 1553 12/06/17 0533  AST 16 16  ALT 8* 8*  ALKPHOS 88 74  BILITOT 0.3 0.4  PROT 7.1 6.4*  ALBUMIN 2.4* 2.2*   No results for input(s): LIPASE, AMYLASE in the last 168 hours. Recent Labs  Lab 12/05/17 1553  AMMONIA 23    Cardiac Enzymes: No results for input(s): CKTOTAL, CKMB, CKMBINDEX, TROPONINI in the last 168 hours. BNP (last 3 results) Recent Labs    11/11/17 1633  BNP 77.6    ProBNP (last 3 results) No results for input(s): PROBNP in the last 8760 hours.    Studies: No results found.  Scheduled Meds: . acidophilus  2 capsule Oral TID  . aspirin  81 mg Oral Daily  . budesonide  0.25 mg Nebulization BID  . clopidogrel  75 mg Oral Q breakfast  . enoxaparin (LOVENOX) injection  40 mg Subcutaneous Q24H  . famotidine  20 mg Oral Daily  . feeding supplement (ENSURE ENLIVE)  237 mL Oral BID BM  . ferrous sulfate  325 mg Oral Q breakfast  . guaiFENesin  10 mL Oral QID  . insulin aspart  0-5 Units Subcutaneous QHS  . insulin aspart  0-9 Units Subcutaneous TID WC  . levalbuterol  0.63 mg Nebulization BID  . levETIRAcetam  500 mg Oral BID  . memantine  14 mg Oral QHS  . multivitamin with minerals  1 tablet Oral Daily  . rosuvastatin  5 mg Oral Q M,W,F  . sodium chloride flush  3 mL Intravenous Q12H  .  Valproate Sodium  250 mg Oral BID  . vitamin C  500 mg Oral Daily      Time spent: 20 min  Meredeth Ide   [facility] Pager [phone]. If 7PM-7AM, please contact night-coverage at  [email or web address], Office  [phone]  password [facility]  12/10/2017, 6:37 PM  LOS: 5 days

## 2017-12-11 LAB — GLUCOSE, CAPILLARY
GLUCOSE-CAPILLARY: 182 mg/dL — AB (ref 65–99)
GLUCOSE-CAPILLARY: 195 mg/dL — AB (ref 65–99)
GLUCOSE-CAPILLARY: 204 mg/dL — AB (ref 65–99)
Glucose-Capillary: 126 mg/dL — ABNORMAL HIGH (ref 65–99)

## 2017-12-11 MED ORDER — LEVETIRACETAM 100 MG/ML PO SOLN
500.0000 mg | Freq: Two times a day (BID) | ORAL | Status: DC
  Administered 2017-12-11 – 2017-12-12 (×2): 500 mg via ORAL
  Filled 2017-12-11 (×2): qty 5

## 2017-12-11 NOTE — Progress Notes (Signed)
[facility]  PROGRESS NOTE  Megan [name] ZOX:[id number] DOB: 02-26-1922 DOA: 12/05/2017 PCP: [name], MD   Brief HPI:    84 yo female with past medical history of seizure disorder, dementia, type 2 diabetes, CVA who presented with altered mental status and somnolence.  She had a coughing episode yesterday morning and was brought to the emergency department.  CT head was negative for acute intracranial abnormality.  Chest x-ray revealed questionable early changes in the right upper lobe.  She was previously hospitalized for aspiration pneumonia     Subjective   Patient seen and examined, denies any complaints.   Assessment/Plan:     1. Acute metabolic encephalopathy-likely from dementia, patient is improved and following commands. 2. Dysphagia-patient was evaluated by speech therapy, started on p.o. diet only for comfort.  Palliative care has seen the patient, and at present daughter is not interested in hospice. 3. Aspiration pneumonia versus H CAP-sepsis ruled out, influenza negative, strep pneumo antigen negative. 4. UTI-urine culture growing ESBL Klebsiella pneumoniae, started on t Meropenem. 5. Acute kidney injury-resolved 6. Diabetes mellitus-continue sliding scale insulin with NovoLog 7. History of CVA-continue aspirin, Plavix, Crestor 8. Seizure disorder-continue Depakote, will add Keppra 500 mg po bid till patient is on meropenem as it can lower seizure threshold. 9. Dementia-continue Namenda    DVT prophylaxis: Lovenox  Code Status: Lovenox  Family Communication: Discussed with daughter at bedside  Disposition Plan: SNF   Consultants:  None   Procedures:  None    Antibiotics:   Anti-infectives (From admission, onward)   Start     Dose/Rate Route Frequency Ordered Stop   12/09/17 2300  meropenem (MERREM) 1 g in sodium chloride 0.9 % 100 mL IVPB     1 g 200 mL/hr over 30 Minutes Intravenous Every 12 hours 12/09/17 1551     12/09/17 1600   meropenem (MERREM) 1 g in sodium chloride 0.9 % 100 mL IVPB     1 g 200 mL/hr over 30 Minutes Intravenous  Once 12/09/17 1548 12/09/17 1729   12/07/17 2200  vancomycin (VANCOCIN) IVPB 1000 mg/200 mL premix  Status:  Discontinued     1,000 mg 200 mL/hr over 60 Minutes Intravenous Every 48 hours 12/05/17 2056 12/06/17 1920   12/06/17 1400  ampicillin-sulbactam (UNASYN) 1.5 g in sodium chloride 0.9 % 100 mL IVPB  Status:  Discontinued     1.5 g 200 mL/hr over 30 Minutes Intravenous Every 8 hours 12/06/17 1002 12/09/17 1548   12/05/17 2200  ceFEPIme (MAXIPIME) 1 g in sodium chloride 0.9 % 100 mL IVPB  Status:  Discontinued     1 g 200 mL/hr over 30 Minutes Intravenous Every 24 hours 12/05/17 2057 12/06/17 1000   12/05/17 2000  vancomycin (VANCOCIN) 1,500 mg in sodium chloride 0.9 % 500 mL IVPB     1,500 mg 250 mL/hr over 120 Minutes Intravenous STAT 12/05/17 1958 12/05/17 2304   12/05/17 1745  Ampicillin-Sulbactam (UNASYN) 3 g in sodium chloride 0.9 % 100 mL IVPB     3 g 200 mL/hr over 30 Minutes Intravenous  Once 12/05/17 1743 12/05/17 2101       Objective   Vitals:   12/10/17 1500 12/10/17 2035 12/11/17 0611 12/11/17 0751  BP:  (!) 111/51 (!) 157/59   Pulse: 86 68 60   Resp: 18 18 16    Temp: 97.6 F (36.4 C) 98.9 F (37.2 C)    TempSrc: Oral     SpO2: 97% 99% 98% 92%  Weight:  Height:       No intake or output data in the 24 hours ending 12/11/17 1602 Filed Weights   12/05/17 1448 12/06/17 0000  Weight: 68 kg (150 lb) 64.9 kg (143 lb 1.6 oz)     Physical Examination:    Mouth: Oral mucosa is moist, no lesions on palate,  Neck: Supple, no deformities, masses, or tenderness Lungs: Normal respiratory effort, bilateral clear to auscultation, no crackles or wheezes.  Heart: Regular rate and rhythm, S1 and S2 normal, no murmurs, rubs auscultated Abdomen: BS normoactive,soft,nondistended,non-tender to palpation,no organomegaly Extremities: No pretibial edema, no  erythema, no cyanosis, no clubbing Neuro : Alert         Data Reviewed: I have personally reviewed following labs and imaging studies  CBG: Recent Labs  Lab 12/10/17 1144 12/10/17 1652 12/10/17 2030 12/11/17 0818 12/11/17 1238  GLUCAP 144* 230* 187* 126* 182*    CBC: Recent Labs  Lab 12/05/17 1553 12/06/17 0533 12/07/17 0509  WBC 8.6 6.8 8.0  NEUTROABS 4.7  --   --   HGB 10.7* 10.3* 9.9*  HCT 35.0* 34.3* 31.4*  MCV 94.1 95.5 92.6  PLT 246 236 238    Basic Metabolic Panel: Recent Labs  Lab 12/05/17 1553 12/06/17 0533 12/07/17 0509  NA 142 140 141  K 4.2 4.1 3.8  CL 109 111 111  CO2 24 21* 22  GLUCOSE 156* 134* 121*  BUN 25* 17 11  CREATININE 1.13* 0.81 0.73  CALCIUM 9.4 8.9 8.8*    Recent Results (from the past 240 hour(s))  Urine culture     Status: Abnormal   Collection Time: 12/05/17  3:53 PM  Result Value Ref Range Status   Specimen Description   Final    URINE, CATHETERIZED Performed at [facility], [address], [place], [place] [address]    Special Requests   Final    URINE, CATHETERIZED Performed at [facility], [address], [place], [place] [address]    Culture (A)  Final    >=100,000 COLONIES/mL KLEBSIELLA PNEUMONIAE Confirmed Extended Spectrum Beta-Lactamase Producer (ESBL).  In bloodstream infections from ESBL organisms, carbapenems are preferred over piperacillin/tazobactam. They are shown to have a lower risk of mortality. Performed at [facility], [address], [place], [place] [address]    Report Status 12/09/2017 FINAL  Final   Organism ID, Bacteria KLEBSIELLA PNEUMONIAE (A)  Final      Susceptibility   Klebsiella pneumoniae - MIC*    AMPICILLIN >=32 RESISTANT Resistant     CEFAZOLIN >=64 RESISTANT Resistant     CEFTRIAXONE >=64 RESISTANT Resistant     CIPROFLOXACIN >=4 RESISTANT Resistant     GENTAMICIN <=1 SENSITIVE Sensitive     IMIPENEM <=0.25 SENSITIVE Sensitive      NITROFURANTOIN 256 RESISTANT Resistant     TRIMETH/SULFA >=320 RESISTANT Resistant     AMPICILLIN/SULBACTAM >=32 RESISTANT Resistant     PIP/TAZO >=128 RESISTANT Resistant     Extended ESBL POSITIVE Resistant     * >=100,000 COLONIES/mL KLEBSIELLA PNEUMONIAE  Culture, blood (routine x 2)     Status: None   Collection Time: 12/05/17  5:44 PM  Result Value Ref Range Status   Specimen Description   Final    BLOOD RIGHT ANTECUBITAL Performed at [facility], [address], [place], [place] [address]    Special Requests   Final    BOTTLES DRAWN AEROBIC AND ANAEROBIC Blood Culture results may not be optimal due  to an excessive volume of blood received in culture bottles Performed at [facility], [address] [place], [place] [address]    Culture   Final    NO GROWTH 5 DAYS Performed at [facility], [address] [place], [place] [address]    Report Status 12/10/2017 FINAL  Final  Culture, blood (routine x 2)     Status: None   Collection Time: 12/05/17  7:21 PM  Result Value Ref Range Status   Specimen Description   Final    BLOOD BLOOD LEFT HAND Performed at [facility], [address] [place], [place] [address]    Special Requests   Final    BOTTLES DRAWN AEROBIC ONLY Blood Culture results may not be optimal due to an inadequate volume of blood received in culture bottles Performed at [facility], [address] [place], [place] [address]    Culture   Final    NO GROWTH 5 DAYS Performed at [facility], [address] [place], [place] [address]    Report Status 12/10/2017 FINAL  Final  MRSA PCR Screening     Status: None   Collection Time: 12/06/17 10:00 AM  Result Value Ref Range Status   MRSA by PCR NEGATIVE NEGATIVE Final    Comment:        The GeneXpert MRSA Assay (FDA approved for NASAL specimens only), is one component of a comprehensive MRSA colonization surveillance  program. It is not intended to diagnose MRSA infection nor to guide or monitor treatment for MRSA infections. Performed at [facility], [address] [place], [place] [address]      Liver Function Tests: Recent Labs  Lab 12/05/17 1553 12/06/17 0533  AST 16 16  ALT 8* 8*  ALKPHOS 88 74  BILITOT 0.3 0.4  PROT 7.1 6.4*  ALBUMIN 2.4* 2.2*   No results for input(s): LIPASE, AMYLASE in the last 168 hours. Recent Labs  Lab 12/05/17 1553  AMMONIA 23    Cardiac Enzymes: No results for input(s): CKTOTAL, CKMB, CKMBINDEX, TROPONINI in the last 168 hours. BNP (last 3 results) Recent Labs    11/11/17 1633  BNP 77.6    ProBNP (last 3 results) No results for input(s): PROBNP in the last 8760 hours.    Studies: No results found.  Scheduled Meds: . acidophilus  2 capsule Oral TID  . aspirin  81 mg Oral Daily  . budesonide  0.25 mg Nebulization BID  . clopidogrel  75 mg Oral Q breakfast  . enoxaparin (LOVENOX) injection  40 mg Subcutaneous Q24H  . famotidine  20 mg Oral Daily  . feeding supplement (ENSURE ENLIVE)  237 mL Oral BID BM  . ferrous sulfate  325 mg Oral Q breakfast  . guaiFENesin  10 mL Oral QID  . insulin aspart  0-5 Units Subcutaneous QHS  . insulin aspart  0-9 Units Subcutaneous TID WC  . levalbuterol  0.63 mg Nebulization BID  . levETIRAcetam  500 mg Oral BID  . memantine  14 mg Oral QHS  . multivitamin with minerals  1 tablet Oral Daily  . rosuvastatin  5 mg Oral Q M,W,F  . sodium chloride flush  3 mL Intravenous Q12H  . Valproate Sodium  250 mg Oral BID  . vitamin C  500 mg Oral Daily      Time spent: 20 min  Meredeth IdeGagan S Lama   [facility] Pager [phone]. If 7PM-7AM, please  contact night-coverage at [email or web address], Office  [phone]  password [email or web address]  12/11/2017, 4:02 PM  LOS: 6 days

## 2017-12-12 ENCOUNTER — Encounter (HOSPITAL_COMMUNITY): Payer: Self-pay

## 2017-12-12 DIAGNOSIS — R569 Unspecified convulsions: Secondary | ICD-10-CM

## 2017-12-12 DIAGNOSIS — E43 Unspecified severe protein-calorie malnutrition: Secondary | ICD-10-CM

## 2017-12-12 LAB — CREATININE, SERUM
CREATININE: 0.52 mg/dL (ref 0.44–1.00)
GFR calc Af Amer: >60 mL/min (ref >60)
GFR calc non Af Amer: >60 mL/min (ref >60)

## 2017-12-12 LAB — GLUCOSE, CAPILLARY: GLUCOSE-CAPILLARY: 123 mg/dL — AB (ref 65–99)

## 2017-12-12 MED ORDER — FOSFOMYCIN TROMETHAMINE 3 G PO PACK
3.0000 g | PACK | ORAL | Status: DC
  Administered 2017-12-12: 3 g via ORAL
  Filled 2017-12-12: qty 3

## 2017-12-12 MED ORDER — FOSFOMYCIN TROMETHAMINE 3 G PO PACK: 3.0000 g | PACK | ORAL | 0 refills | Status: AC

## 2017-12-12 NOTE — Progress Notes (Signed)
Nutrition Follow-up  DOCUMENTATION CODES:   Not applicable  INTERVENTION:  - Continue Ensure Enlive BID, thickened to nectar-thick consistency.  - Will order Magic Cup TID with meals, each supplement provides 290 kcal and 9 grams of protein. - Continue to encourage PO intakes as desired/tolerated.   NUTRITION DIAGNOSIS:   Inadequate oral intake related to lethargy/confusion, inability to eat as evidenced by NPO status, other (comment)(daughter's report). -diet advanced to FLD, nectar-thick but continues with poor intakes.   GOAL:   Patient will meet greater than or equal to 90% of their needs -unmet   MONITOR:   PO intake, Supplement acceptance, Weight trends, Labs, Skin  ASSESSMENT:   84 yo female with past medical history of seizure disorder, dementia, type 2 diabetes, CVA who presented with altered mental status and somnolence.  She had a coughing episode yesterday morning and was brought to the emergency department.  CT head was negative for acute intracranial abnormality.  Chest x-ray revealed questionable early changes in the right upper lobe.  She was previously hospitalized for aspiration pneumonia.  No weight since 4/16. Diet advanced from NPO to FLD, nectar-thick on 4/16 at ~4:00 PM. No formal meal completion percentages documented since that time but pt it appears that pt has been consuming 50-180 mL at meal times or at times when she is taking liquids during the day. Ensure Enlive ordered BID (with note to thicken to nectar-thick) and pt has been provided with/accepted 9 of 11 bottles.   Per Dr. Daune PerchLama's note yesterday evening: mentation has improved and pt is following commands, PO diet is for comfort only, Palliative following; daughter not interested in hospice at this time, UTI, and resolved AKI.  Medications reviewed; 2 capsules Risaquad TID, 20 mg oral Pepcid/day, 325 mg ferrous sulfate/day, sliding scale Novolog, daily multivitamin with minerals, 500 mg oral ascorbic  acid/day.  Labs reviewed; CBG: 123 mg/dL this AM.     Diet Order:  Diet full liquid Room service appropriate? Yes; Fluid consistency: Nectar Thick  EDUCATION NEEDS:   No education needs have been identified at this time  Skin:  Skin Assessment: Skin Integrity Issues: Skin Integrity Issues:: DTI, Stage III, Unstageable DTI: L heel and R toe Stage III: bilateral buttocks Unstageable: full thickness L heel  Last BM:  4/19  Height:   Ht Readings from Last 1 Encounters:  12/06/17 5' 2.5" (1.588 m)    Weight:   Wt Readings from Last 1 Encounters:  12/06/17 143 lb 1.6 oz (64.9 kg)    Ideal Body Weight:  51.14 kg  BMI:  Body mass index is 25.76 kg/m.  Estimated Nutritional Needs:   Kcal:  1300-1500 (20-23 kcal/kg).  Protein:  50-60 grams  Fluid:  >/= 1.5 L/day      Trenton GammonJessica Shaylon Gillean, MS, RD, LDN, [facility] Inpatient Clinical Dietitian Pager # [phone] After hours/weekend pager # [phone]

## 2017-12-12 NOTE — Discharge Summary (Signed)
Physician Discharge Summary  Megan [name]:[id number] DOB: 1921-09-05 DOA: 12/05/2017  PCP: Megan Funk, MD  Admit date: 12/05/2017 Discharge date: 12/12/2017  Time spent: 35* minutes  Recommendations for Outpatient Follow-up:  Follow up PCP in 2 weeks Take one dose of Fosfomycin 3 gm po x 1 on 12/15/17   Discharge Diagnoses:  Principal Problem:   Acute metabolic encephalopathy Active Problems:   Diabetes mellitus type II, uncontrolled (HCC)   Protein-calorie malnutrition, severe (HCC)   Dementia with Lewy bodies   Altered mental status   Seizure (HCC)   Unstageable pressure ulcer of sacral region [facility])   UTI (urinary tract infection)   HCAP (healthcare-associated pneumonia)   Goals of care, counseling/discussion   Dysphagia   Discharge Condition: Stable  Diet recommendation: Nectar thick liquid  Filed Weights   12/05/17 1448 12/06/17 0000  Weight: 68 kg (150 lb) 64.9 kg (143 lb 1.6 oz)    History of present illness:  84 yo female withpast medical history of seizure disorder, dementia, type 2 diabetes, CVA who presented with altered mental status and somnolence. She had a coughing episode yesterday morning and was brought to the emergency department. CT head was negative for acute intracranial abnormality. Chest x-ray revealed questionable early changes in the right upper lobe. She was previously hospitalized for aspiration pneumonia   Hospital Course:   1. Acute metabolic encephalopathy-likely from dementia, patient has improved and following commands. 2. Dysphagia-patient was evaluated by speech therapy, started on p.o. diet only for comfort.  Palliative care has been consulted 3. Aspiration pneumonia versus H CAP-sepsis ruled out, influenza negative, strep pneumo antigen negative. 4. UTI-urine culture growing ESBL Klebsiella pneumoniae, started on t Meropenem. Has been given one dose of Fosfomycin 3 gm po x  1, will repeat the dose on 12/15/17 5. Acute kidney  injury-resolved 6. Diabetes mellitus-continue sliding scale insulin with NovoLog 7. History of CVA-continue aspirin, Plavix, Crestor 8. Seizure disorder-continue Depakote, will add Keppra 500 mg po bid till patient is on meropenem as it can lower seizure threshold. 9. Dementia-continue Namenda     Procedures:  None   Consultations:  Palliative care  Discharge Exam: Vitals:   12/11/17 2225 12/12/17 0608  BP: (!) 132/48 (!) 152/63  Pulse: 71 62  Resp: 18 18  Temp: 98.3 F (36.8 C) 98.4 F (36.9 C)  SpO2: 97% 99%    General: Appears in no acute distress Cardiovascular: s1S2 RRR Respiratory: Clear bilaterally  Discharge Instructions   Discharge Instructions    Diet - low sodium heart healthy   Complete by:  As directed    Increase activity slowly   Complete by:  As directed      Allergies as of 12/12/2017      Reactions   Lipitor [atorvastatin] Other (See Comments)   Myalgias       Medication List    STOP taking these medications   diphenoxylate-atropine 2.5-0.025 MG/5ML liquid Commonly known as:  LOMOTIL   ibuprofen 100 MG/5ML suspension Commonly known as:  ADVIL,MOTRIN   metroNIDAZOLE 50 mg/ml oral suspension Commonly known as:  FLAGYL   ondansetron 4 MG tablet Commonly known as:  ZOFRAN     TAKE these medications   acetaminophen 325 MG tablet Commonly known as:  TYLENOL Take 2 tablets (650 mg total) by mouth every 6 (six) hours as needed for mild pain (or Fever >/= 101).   aspirin EC 81 MG tablet Take 1 tablet (81 mg total) by mouth daily.   budesonide 0.25 MG/2ML  nebulizer solution Commonly known as:  PULMICORT Take 2 mLs (0.25 mg total) by nebulization 2 (two) times daily.   clopidogrel 75 MG tablet Commonly known as:  PLAVIX Take 1 tablet (75 mg total) by mouth daily with breakfast.   famotidine 20 MG tablet Commonly known as:  PEPCID Take 1 tablet (20 mg total) by mouth daily.   feeding supplement (PRO-STAT SUGAR FREE 64)  Liqd Take 30 mLs by mouth 4 (four) times daily -  with meals and at bedtime.   ferrous sulfate 325 (65 FE) MG tablet Take 325 mg by mouth daily with breakfast.   fosfomycin 3 g Pack Commonly known as:  MONUROL Take 3 g by mouth every 3 (three) days for 1 dose. Start taking on:  12/15/2017   guaiFENesin 100 MG/5ML Soln Commonly known as:  ROBITUSSIN Take 10 mLs (200 mg total) by mouth 4 (four) times daily.   insulin aspart 100 UNIT/ML injection Commonly known as:  NOVOLOG Inject 2 Units into the skin 3 (three) times daily with meals. Please inject 2 units if patient eats more than 50 % meals and if Blood sugar above 160   lactobacillus acidophilus Tabs tablet Take 2 tablets by mouth 3 (three) times daily.   levalbuterol 0.63 MG/3ML nebulizer solution Commonly known as:  XOPENEX Take 3 mLs (0.63 mg total) by nebulization 3 (three) times daily.   loperamide 2 MG tablet Commonly known as:  IMODIUM A-D Take 1 tablet (2 mg total) by mouth 4 (four) times daily as needed for diarrhea or loose stools.   Melatonin 3 MG Tabs Take 1 tablet (3 mg total) by mouth at bedtime as needed (sleep).   multivitamin with minerals Tabs tablet Take 1 tablet by mouth daily.   NAMENDA XR 14 MG Cp24 24 hr capsule Generic drug:  memantine Take 14 mg by mouth at bedtime.   nystatin powder Generic drug:  nystatin Apply 1 application topically 2 (two) times daily as needed for rash.   ondansetron 4 MG disintegrating tablet Commonly known as:  ZOFRAN ODT Take 1 tablet (4 mg total) by mouth every 8 (eight) hours as needed.   protein supplement shake Liqd Commonly known as:  PREMIER PROTEIN Take 59.1 mLs (2 oz total) by mouth 4 (four) times daily -  with meals and at bedtime.   rosuvastatin 5 MG tablet Commonly known as:  CRESTOR Take 5 mg by mouth every Monday, Wednesday, and Friday. At bedtime   TOUJEO MAX SOLOSTAR 300 UNIT/ML Sopn Generic drug:  Insulin Glargine Inject 5-9 Units into the  skin at bedtime.   Valproate Sodium 250 MG/5ML Soln solution Commonly known as:  DEPAKENE Take 5 mLs (250 mg total) by mouth 2 (two) times daily.   vitamin C 500 MG tablet Commonly known as:  ASCORBIC ACID Take 500 mg by mouth daily.   zinc oxide 11.3 % Crea cream Commonly known as:  BALMEX Apply 1 application topically daily as needed (for rash).      Allergies  Allergen Reactions  . Lipitor [Atorvastatin] Other (See Comments)    Myalgias    Follow-up Information    [facility] Follow up.   Specialty:  Home Health Services Why:  [facility] nursing/physical/speech/aide Contact information: [address] [place] [address] [phone]            The results of significant diagnostics from this hospitalization (including imaging, microbiology, ancillary and laboratory) are listed below for reference.    Significant Diagnostic Studies: Dg  Chest 1 View  Result Date: 12/03/2017 CLINICAL DATA:  Decreased appetite. EXAM: CHEST  1 VIEW COMPARISON:  11/23/2017 FINDINGS: Persistent low lung volumes. Small pleural effusions have decreased from prior exam. Persistent patchy bibasilar opacities. Unchanged heart size and mediastinal contours allowing for differences in technique. No pneumothorax or new focal airspace disease. IMPRESSION: Low lung volumes with persistent bibasilar opacities, may be pneumonia or aspiration. Diminished bilateral pleural effusions from prior. Electronically Signed   By: Rubye Oaks M.D.   On: 12/03/2017 23:04   Dg Chest 1 View  Result Date: 11/23/2017 CLINICAL DATA:  Altered mental status with nausea and vomiting EXAM: CHEST  1 VIEW COMPARISON:  November 15, 2017 FINDINGS: There are persistent small pleural effusions with bibasilar atelectasis. Lungs elsewhere are clear. Heart size and pulmonary vascularity are normal. No adenopathy. There is aortic atherosclerosis. No bone lesions. IMPRESSION: Small pleural effusions with bibasilar  atelectasis. No new opacity. Stable cardiac silhouette. There is aortic atherosclerosis. Aortic Atherosclerosis (ICD10-I70.0). Electronically Signed   By: Bretta Bang III M.D.   On: 11/23/2017 10:18   Dg Chest 2 View  Result Date: 12/05/2017 CLINICAL DATA:  Altered mental status and cough EXAM: CHEST - 2 VIEW COMPARISON:  12/03/2017 FINDINGS: Cardiac shadow is stable. Bibasilar changes are noted similar to that seen on the prior exam and multiple previous exams dating back to 10/26/2017. These are likely chronic in nature. Some mild increased patchy density is noted in the right upper lobe which may represent some early atelectasis/infiltrate. No bony abnormality is noted. IMPRESSION: Chronic changes in the bases. Question early changes in the right upper lobe. Electronically Signed   By: Alcide Clever M.D.   On: 12/05/2017 16:55   Dg Chest 2 View  Result Date: 11/15/2017 CLINICAL DATA:  Cough, previous episodes of pneumonia, TIA-CVA. EXAM: CHEST - 2 VIEW COMPARISON:  Portable chest x-ray of November 11, 2017. FINDINGS: The lungs are adequately inflated. The interstitial markings remain increased. Hazy increased density at both lung bases is present and not greatly changed. The cardiac silhouette is top-normal in size. The pulmonary vascularity is not clearly engorged. There is calcification in the wall of the aortic arch. There is multilevel degenerative disc disease of the thoracic spine. IMPRESSION: Bibasilar atelectasis or pneumonia. Increased interstitial prominence bilaterally may reflect interstitial edema or pneumonitis type pattern. Electronically Signed   By: David  [place]az[place] M.D.   On: 11/15/2017 13:24   Ct Head Wo Contrast  Result Date: 12/05/2017 CLINICAL DATA:  84 year old female with altered mental status EXAM: CT HEAD WITHOUT CONTRAST TECHNIQUE: Contiguous axial images were obtained from the base of the skull through the vertex without intravenous contrast. COMPARISON:  Prior head CT  10/31/2017 FINDINGS: Brain: No evidence of acute infarction, hemorrhage, hydrocephalus, extra-axial collection or mass lesion/mass effect. Stable appearance of advanced cerebral and cerebellar cortical atrophy. Confluent periventricular, subcortical and deep white matter hypoattenuation remain most consistent with advanced chronic microvascular ischemic white matter disease. Vascular: No hyperdense vessel or unexpected calcification. Skull: Normal. Negative for fracture or focal lesion. Sinuses/Orbits: No acute finding. Other: None. IMPRESSION: 1. No acute intracranial abnormality. 2. Stable advanced chronic microvascular ischemic white matter disease and cortical atrophy. Electronically Signed   By: Malachy Moan M.D.   On: 12/05/2017 15:41   Dg Swallowing Func-speech Pathology  Result Date: 11/13/2017 Objective Swallowing Evaluation: Type of Study: MBS-Modified Barium Swallow Study  Patient Details Name: Shemika Robbs MRN: [id number] Date of Birth: 04/09/1922 Today's Date: 11/13/2017 Time: SLP Start Time (ACUTE ONLY):  1135 -SLP Stop Time (ACUTE ONLY): 1205 SLP Time Calculation (min) (ACUTE ONLY): 30 min Past Medical History: Past Medical History: Diagnosis Date . Diabetes mellitus  . Hyperlipidemia  . Seizure (HCC)  . Stroke [facility]) 2012  possible stroke Jan 2016 . UTI (urinary tract infection) 10/2017 Past Surgical History: Past Surgical History: Procedure Laterality Date . CATARACT EXTRACTION   HPI: [name] who is presenting from home for worsening mental status, nausea vomiting, diarrhea, possible aspiration by reported her daughter  No data recorded Assessment / Plan / Recommendation CHL IP CLINICAL IMPRESSIONS 11/13/2017 Clinical Impression Pt currently presenting with a severe oral phase, mild pharyngeal phase, cognitive-based dysphagia. Pt with oral holding of puree bolus greater than 30 seconds, along with minimal oral residuals post-swallow. With both nectar-thick and thin  liquid consistencies, pt had inconsistent timing of swallow initiation, sometimes premature spill with bolus filling the vallecula and spilling over to pyriform sinuses prior to swallow. Despite this, pt had excellent airway protection and showed only one instance of flash penetration with nectar thick liquids but no other penetration or aspiration observed, no significant pharyngeal residuals, occasional oral residuals post-swallow. Pt was easily distracted during study, frequently looking down at IV and turning to look at video. While pt did not show any penetration or aspiration during this study, pt does continue to be at an increased risk due to cognitive status and oral phase deficits observed- decreased attention to bolus or oral residuals post-swallow. Daughter present for study- feels that pt coughs when drinking water but less coughing with thicker liquids such as protein shake. Recommend initiating dysphagia 1 diet, thin liquids, meds crushed in puree, or whole in puree if unable to crush, full supervision- ensure pt fully upright (in chair if possible), allow to self-feed to the extent she is able, monitor oral holding, ensure pt has swallowed bolus before providing additional food, minimize distractions, oral care before/ after meals. If frequent s/s of aspiration at bedside with thin liquids, may consider downgrade to nectar-thick, although during MBS there was essentially no benefit, as the timing of swallow initiation was variable with both. SLP will continue to follow for diet tolerance/ education.  SLP Visit Diagnosis Dysphagia, oropharyngeal phase (R13.12) Attention and concentration deficit following -- Frontal lobe and executive function deficit following -- Impact on safety and function Moderate aspiration risk   CHL IP TREATMENT RECOMMENDATION 11/13/2017 Treatment Recommendations Therapy as outlined in treatment plan below   Prognosis 11/13/2017 Prognosis for Safe Diet Advancement Fair Barriers  to Reach Goals Cognitive deficits Barriers/Prognosis Comment -- CHL IP DIET RECOMMENDATION 11/13/2017 SLP Diet Recommendations Dysphagia 1 (Puree) solids;Thin liquid Liquid Administration via Straw Medication Administration Crushed with puree Compensations Minimize environmental distractions;Slow rate;Small sips/bites;Lingual sweep for clearance of pocketing Postural Changes Remain semi-upright after after feeds/meals (Comment);Seated upright at 90 degrees   CHL IP OTHER RECOMMENDATIONS 11/13/2017 Recommended Consults -- Oral Care Recommendations Oral care before and after PO Other Recommendations Clarify dietary restrictions   CHL IP FOLLOW UP RECOMMENDATIONS 11/13/2017 Follow up Recommendations 24 hour supervision/assistance   CHL IP FREQUENCY AND DURATION 11/13/2017 Speech Therapy Frequency (ACUTE ONLY) min 2x/week Treatment Duration 1 week      CHL IP ORAL PHASE 11/13/2017 Oral Phase Impaired Oral - Pudding Teaspoon -- Oral - Pudding Cup -- Oral - Honey Teaspoon -- Oral - Honey Cup -- Oral - Nectar Teaspoon -- Oral - Nectar Cup -- Oral - Nectar Straw Lingual/palatal residue;Delayed oral transit;Decreased bolus cohesion;Premature spillage Oral - Thin Teaspoon -- Oral -  Thin Cup -- Oral - Thin Straw Lingual/palatal residue;Delayed oral transit;Decreased bolus cohesion;Premature spillage Oral - Puree Holding of bolus;Lingual/palatal residue;Delayed oral transit Oral - Mech Soft -- Oral - Regular -- Oral - Multi-Consistency -- Oral - Pill -- Oral Phase - Comment --  CHL IP PHARYNGEAL PHASE 11/13/2017 Pharyngeal Phase Impaired Pharyngeal- Pudding Teaspoon -- Pharyngeal -- Pharyngeal- Pudding Cup -- Pharyngeal -- Pharyngeal- Honey Teaspoon -- Pharyngeal -- Pharyngeal- Honey Cup -- Pharyngeal -- Pharyngeal- Nectar Teaspoon -- Pharyngeal -- Pharyngeal- Nectar Cup -- Pharyngeal -- Pharyngeal- Nectar Straw Delayed swallow initiation-pyriform sinuses;Penetration/Aspiration before swallow Pharyngeal Material enters airway,  remains ABOVE vocal cords then ejected out Pharyngeal- Thin Teaspoon -- Pharyngeal -- Pharyngeal- Thin Cup -- Pharyngeal -- Pharyngeal- Thin Straw Delayed swallow initiation-pyriform sinuses Pharyngeal -- Pharyngeal- Puree WFL Pharyngeal -- Pharyngeal- Mechanical Soft -- Pharyngeal -- Pharyngeal- Regular -- Pharyngeal -- Pharyngeal- Multi-consistency -- Pharyngeal -- Pharyngeal- Pill -- Pharyngeal -- Pharyngeal Comment --  No flowsheet data found. No flowsheet data found. Amy Cecille Aver, MA, CCC-SLP 11/13/2017, 12:47 PM [phone]               Microbiology: Recent Results (from the past 240 hour(s))  Urine culture     Status: Abnormal   Collection Time: 12/05/17  3:53 PM  Result Value Ref Range Status   Specimen Description   Final    URINE, CATHETERIZED Performed at [facility], [address], [place], [place] [address]    Special Requests   Final    URINE, CATHETERIZED Performed at [facility], [address] [place], [place] [address]    Culture (A)  Final    >=100,000 COLONIES/mL KLEBSIELLA PNEUMONIAE Confirmed Extended Spectrum Beta-Lactamase Producer (ESBL).  In bloodstream infections from ESBL organisms, carbapenems are preferred over piperacillin/tazobactam. They are shown to have a lower risk of mortality. Performed at [facility], [address] [place], [place] [address]    Report Status 12/09/2017 FINAL  Final   Organism ID, Bacteria KLEBSIELLA PNEUMONIAE (A)  Final      Susceptibility   Klebsiella pneumoniae - MIC*    AMPICILLIN >=32 RESISTANT Resistant     CEFAZOLIN >=64 RESISTANT Resistant     CEFTRIAXONE >=64 RESISTANT Resistant     CIPROFLOXACIN >=4 RESISTANT Resistant     GENTAMICIN <=1 SENSITIVE Sensitive     IMIPENEM <=0.25 SENSITIVE Sensitive     NITROFURANTOIN 256 RESISTANT Resistant     TRIMETH/SULFA >=320 RESISTANT Resistant     AMPICILLIN/SULBACTAM >=32 RESISTANT Resistant     PIP/TAZO >=128 RESISTANT  Resistant     Extended ESBL POSITIVE Resistant     * >=100,000 COLONIES/mL KLEBSIELLA PNEUMONIAE  Culture, blood (routine x 2)     Status: None   Collection Time: 12/05/17  5:44 PM  Result Value Ref Range Status   Specimen Description   Final    BLOOD RIGHT ANTECUBITAL Performed at [facility], [address], [place], [place] [address]    Special Requests   Final    BOTTLES DRAWN AEROBIC AND ANAEROBIC Blood Culture results may not be optimal due to an excessive volume of blood received in culture bottles Performed at [facility], [address] [place], [place] [address]    Culture   Final    NO GROWTH 5 DAYS Performed at [facility], [address], [place], [place] [address]    Report Status 12/10/2017 FINAL  Final  Culture, blood (routine x 2)  Status: None   Collection Time: 12/05/17  7:21 PM  Result Value Ref Range Status   Specimen Description   Final    BLOOD BLOOD LEFT HAND Performed at [facility], [address] [place], [place] [address]    Special Requests   Final    BOTTLES DRAWN AEROBIC ONLY Blood Culture results may not be optimal due to an inadequate volume of blood received in culture bottles Performed at [facility], [address] [place], [place] [address]    Culture   Final    NO GROWTH 5 DAYS Performed at [facility], [address] [place], [place] [address]    Report Status 12/10/2017 FINAL  Final  MRSA PCR Screening     Status: None   Collection Time: 12/06/17 10:00 AM  Result Value Ref Range Status   MRSA by PCR NEGATIVE NEGATIVE Final    Comment:        The GeneXpert MRSA Assay (FDA approved for NASAL specimens only), is one component of a comprehensive MRSA colonization surveillance program. It is not intended to diagnose MRSA infection nor to guide or monitor treatment for MRSA infections. Performed at [facility], [address] [place], [place] [address]      Labs: Basic Metabolic Panel: Recent Labs  Lab 12/05/17 1553 12/06/17 0533 12/07/17 0509 12/12/17 0541  NA 142 140 141  --   K 4.2 4.1 3.8  --   CL 109 111 111  --   CO2 24 21* 22  --   GLUCOSE 156* 134* 121*  --   BUN 25* 17 11  --   CREATININE 1.13* 0.81 0.73 0.52  CALCIUM 9.4 8.9 8.8*  --    Liver Function Tests: Recent Labs  Lab 12/05/17 1553 12/06/17 0533  AST 16 16  ALT 8* 8*  ALKPHOS 88 74  BILITOT 0.3 0.4  PROT 7.1 6.4*  ALBUMIN 2.4* 2.2*   No results for input(s): LIPASE, AMYLASE in the last 168 hours. Recent Labs  Lab 12/05/17 1553  AMMONIA 23   CBC: Recent Labs  Lab 12/05/17 1553 12/06/17 0533 12/07/17 0509  WBC 8.6 6.8 8.0  NEUTROABS 4.7  --   --   HGB 10.7* 10.3* 9.9*  HCT 35.0* 34.3* 31.4*  MCV 94.1 95.5 92.6  PLT 246 236 238   Cardiac Enzymes: No results for input(s): CKTOTAL, CKMB, CKMBINDEX, TROPONINI in the last 168 hours. BNP: BNP (last 3 results) Recent Labs    11/11/17 1633  BNP 77.6    ProBNP (last 3 results) No results for input(s): PROBNP in the last 8760 hours.  CBG: Recent Labs  Lab 12/11/17 0818 12/11/17 1238 12/11/17 1641 12/11/17 2141 12/12/17 0731  GLUCAP 126* 182* 195* 204* 123*       Signed:  Meredeth IdeGagan S Lama MD.  [facility] 12/12/2017, 12:58 PM

## 2017-12-13 LAB — GLUCOSE, CAPILLARY: Glucose-Capillary: 192 mg/dL — ABNORMAL HIGH (ref 65–99)

## 2018-01-27 DIAGNOSIS — L89312 Pressure ulcer of right buttock, stage 2: Secondary | ICD-10-CM | POA: Diagnosis not present

## 2018-01-27 DIAGNOSIS — L89322 Pressure ulcer of left buttock, stage 2: Secondary | ICD-10-CM | POA: Diagnosis not present

## 2018-01-27 DIAGNOSIS — I10 Essential (primary) hypertension: Secondary | ICD-10-CM | POA: Diagnosis not present

## 2018-01-27 DIAGNOSIS — G40209 Localization-related (focal) (partial) symptomatic epilepsy and epileptic syndromes with complex partial seizures, not intractable, without status epilepticus: Secondary | ICD-10-CM | POA: Diagnosis not present

## 2018-01-27 DIAGNOSIS — Z87891 Personal history of nicotine dependence: Secondary | ICD-10-CM | POA: Diagnosis not present

## 2018-01-27 DIAGNOSIS — F0280 Dementia in other diseases classified elsewhere without behavioral disturbance: Secondary | ICD-10-CM | POA: Diagnosis not present

## 2018-01-27 DIAGNOSIS — I69351 Hemiplegia and hemiparesis following cerebral infarction affecting right dominant side: Secondary | ICD-10-CM | POA: Diagnosis not present

## 2018-01-27 DIAGNOSIS — E119 Type 2 diabetes mellitus without complications: Secondary | ICD-10-CM | POA: Diagnosis not present

## 2018-01-27 DIAGNOSIS — G3183 Dementia with Lewy bodies: Secondary | ICD-10-CM | POA: Diagnosis not present

## 2018-01-27 DIAGNOSIS — R1310 Dysphagia, unspecified: Secondary | ICD-10-CM | POA: Diagnosis not present

## 2018-01-27 DIAGNOSIS — L89152 Pressure ulcer of sacral region, stage 2: Secondary | ICD-10-CM | POA: Diagnosis not present

## 2018-02-07 ENCOUNTER — Encounter (HOSPITAL_COMMUNITY): Payer: Self-pay

## 2018-02-07 ENCOUNTER — Inpatient Hospital Stay (HOSPITAL_COMMUNITY)
Admission: EM | Admit: 2018-02-07 | Discharge: 2018-02-11 | DRG: 682 | Disposition: A | Discharge status: Hospice | Attending: Internal Medicine | Admitting: Internal Medicine

## 2018-02-07 ENCOUNTER — Emergency Department (HOSPITAL_COMMUNITY)

## 2018-02-07 ENCOUNTER — Other Ambulatory Visit: Payer: Self-pay

## 2018-02-07 DIAGNOSIS — B961 Klebsiella pneumoniae [K. pneumoniae] as the cause of diseases classified elsewhere: Secondary | ICD-10-CM | POA: Diagnosis present

## 2018-02-07 DIAGNOSIS — E860 Dehydration: Secondary | ICD-10-CM | POA: Diagnosis present

## 2018-02-07 DIAGNOSIS — Z79890 Hormone replacement therapy (postmenopausal): Secondary | ICD-10-CM

## 2018-02-07 DIAGNOSIS — Z9849 Cataract extraction status, unspecified eye: Secondary | ICD-10-CM | POA: Diagnosis not present

## 2018-02-07 DIAGNOSIS — E785 Hyperlipidemia, unspecified: Secondary | ICD-10-CM | POA: Diagnosis present

## 2018-02-07 DIAGNOSIS — Z8701 Personal history of pneumonia (recurrent): Secondary | ICD-10-CM

## 2018-02-07 DIAGNOSIS — Z794 Long term (current) use of insulin: Secondary | ICD-10-CM

## 2018-02-07 DIAGNOSIS — N179 Acute kidney failure, unspecified: Principal | ICD-10-CM | POA: Diagnosis present

## 2018-02-07 DIAGNOSIS — E870 Hyperosmolality and hypernatremia: Secondary | ICD-10-CM | POA: Diagnosis present

## 2018-02-07 DIAGNOSIS — Z7951 Long term (current) use of inhaled steroids: Secondary | ICD-10-CM | POA: Diagnosis not present

## 2018-02-07 DIAGNOSIS — I69351 Hemiplegia and hemiparesis following cerebral infarction affecting right dominant side: Secondary | ICD-10-CM | POA: Diagnosis not present

## 2018-02-07 DIAGNOSIS — E1165 Type 2 diabetes mellitus with hyperglycemia: Secondary | ICD-10-CM | POA: Diagnosis present

## 2018-02-07 DIAGNOSIS — N390 Urinary tract infection, site not specified: Secondary | ICD-10-CM | POA: Diagnosis present

## 2018-02-07 DIAGNOSIS — Z7982 Long term (current) use of aspirin: Secondary | ICD-10-CM

## 2018-02-07 DIAGNOSIS — I959 Hypotension, unspecified: Secondary | ICD-10-CM | POA: Diagnosis not present

## 2018-02-07 DIAGNOSIS — F0280 Dementia in other diseases classified elsewhere without behavioral disturbance: Secondary | ICD-10-CM | POA: Diagnosis present

## 2018-02-07 DIAGNOSIS — Z66 Do not resuscitate: Secondary | ICD-10-CM | POA: Diagnosis present

## 2018-02-07 DIAGNOSIS — R4182 Altered mental status, unspecified: Secondary | ICD-10-CM

## 2018-02-07 DIAGNOSIS — Z1612 Extended spectrum beta lactamase (ESBL) resistance: Secondary | ICD-10-CM | POA: Diagnosis present

## 2018-02-07 DIAGNOSIS — G9340 Encephalopathy, unspecified: Secondary | ICD-10-CM | POA: Diagnosis present

## 2018-02-07 DIAGNOSIS — G9341 Metabolic encephalopathy: Secondary | ICD-10-CM | POA: Diagnosis present

## 2018-02-07 DIAGNOSIS — Z8669 Personal history of other diseases of the nervous system and sense organs: Secondary | ICD-10-CM | POA: Diagnosis not present

## 2018-02-07 DIAGNOSIS — I10 Essential (primary) hypertension: Secondary | ICD-10-CM | POA: Diagnosis not present

## 2018-02-07 DIAGNOSIS — Z79899 Other long term (current) drug therapy: Secondary | ICD-10-CM | POA: Diagnosis not present

## 2018-02-07 DIAGNOSIS — G40909 Epilepsy, unspecified, not intractable, without status epilepticus: Secondary | ICD-10-CM | POA: Diagnosis present

## 2018-02-07 DIAGNOSIS — D696 Thrombocytopenia, unspecified: Secondary | ICD-10-CM | POA: Diagnosis present

## 2018-02-07 DIAGNOSIS — R1310 Dysphagia, unspecified: Secondary | ICD-10-CM | POA: Diagnosis not present

## 2018-02-07 DIAGNOSIS — Z8249 Family history of ischemic heart disease and other diseases of the circulatory system: Secondary | ICD-10-CM

## 2018-02-07 DIAGNOSIS — Z8673 Personal history of transient ischemic attack (TIA), and cerebral infarction without residual deficits: Secondary | ICD-10-CM

## 2018-02-07 DIAGNOSIS — R627 Adult failure to thrive: Secondary | ICD-10-CM | POA: Diagnosis present

## 2018-02-07 DIAGNOSIS — R402441 Other coma, without documented Glasgow coma scale score, or with partial score reported, in the field [EMT or ambulance]: Secondary | ICD-10-CM | POA: Diagnosis not present

## 2018-02-07 DIAGNOSIS — Z8619 Personal history of other infectious and parasitic diseases: Secondary | ICD-10-CM | POA: Diagnosis not present

## 2018-02-07 DIAGNOSIS — L89322 Pressure ulcer of left buttock, stage 2: Secondary | ICD-10-CM | POA: Diagnosis not present

## 2018-02-07 DIAGNOSIS — N3000 Acute cystitis without hematuria: Secondary | ICD-10-CM | POA: Diagnosis not present

## 2018-02-07 DIAGNOSIS — Z87440 Personal history of urinary (tract) infections: Secondary | ICD-10-CM | POA: Diagnosis not present

## 2018-02-07 DIAGNOSIS — G3183 Dementia with Lewy bodies: Secondary | ICD-10-CM | POA: Diagnosis present

## 2018-02-07 DIAGNOSIS — IMO0002 Reserved for concepts with insufficient information to code with codable children: Secondary | ICD-10-CM | POA: Diagnosis present

## 2018-02-07 DIAGNOSIS — L89312 Pressure ulcer of right buttock, stage 2: Secondary | ICD-10-CM | POA: Diagnosis not present

## 2018-02-07 DIAGNOSIS — Z833 Family history of diabetes mellitus: Secondary | ICD-10-CM

## 2018-02-07 DIAGNOSIS — Z87891 Personal history of nicotine dependence: Secondary | ICD-10-CM

## 2018-02-07 LAB — CBC WITH DIFFERENTIAL/PLATELET
ABS IMMATURE GRANULOCYTES: 0.1 10*3/uL (ref 0.0–0.1)
BASOS PCT: 0 %
Basophils Absolute: 0 10*3/uL (ref 0.0–0.1)
Eosinophils Absolute: 0.2 10*3/uL (ref 0.0–0.7)
Eosinophils Relative: 2 %
HEMATOCRIT: 54.7 % — AB (ref 36.0–46.0)
HEMOGLOBIN: 16.7 g/dL — AB (ref 12.0–15.0)
IMMATURE GRANULOCYTES: 1 %
LYMPHS ABS: 4 10*3/uL (ref 0.7–4.0)
Lymphocytes Relative: 38 %
MCH: 28.8 pg (ref 26.0–34.0)
MCHC: 30.5 g/dL (ref 30.0–36.0)
MCV: 94.5 fL (ref 78.0–100.0)
MONO ABS: 0.7 10*3/uL (ref 0.1–1.0)
MONOS PCT: 7 %
NEUTROS ABS: 5.6 10*3/uL (ref 1.7–7.7)
NEUTROS PCT: 52 %
PLATELETS: 117 10*3/uL — AB (ref 150–400)
RBC: 5.79 MIL/uL — ABNORMAL HIGH (ref 3.87–5.11)
RDW: 18 % — ABNORMAL HIGH (ref 11.5–15.5)
WBC: 10.6 10*3/uL — ABNORMAL HIGH (ref 4.0–10.5)

## 2018-02-07 LAB — COMPREHENSIVE METABOLIC PANEL
ALBUMIN: 2.7 g/dL — AB (ref 3.5–5.0)
ALK PHOS: 219 U/L — AB (ref 38–126)
ALT: 33 U/L (ref 14–54)
ANION GAP: 9 (ref 5–15)
AST: 30 U/L (ref 15–41)
BUN: 70 mg/dL — ABNORMAL HIGH (ref 6–20)
CALCIUM: 10 mg/dL (ref 8.9–10.3)
CHLORIDE: 123 mmol/L — AB (ref 101–111)
CO2: 24 mmol/L (ref 22–32)
Creatinine, Ser: 2.03 mg/dL — ABNORMAL HIGH (ref 0.44–1.00)
GFR calc non Af Amer: 20 mL/min — ABNORMAL LOW (ref >60)
GFR, EST AFRICAN AMERICAN: 23 mL/min — AB (ref >60)
GLUCOSE: 71 mg/dL (ref 65–99)
Potassium: 4.4 mmol/L (ref 3.5–5.1)
SODIUM: 156 mmol/L — AB (ref 135–145)
Total Bilirubin: 0.4 mg/dL (ref 0.3–1.2)
Total Protein: 7.6 g/dL (ref 6.5–8.1)

## 2018-02-07 LAB — GLUCOSE, CAPILLARY
GLUCOSE-CAPILLARY: 156 mg/dL — AB (ref 65–99)
Glucose-Capillary: 43 mg/dL — CL (ref 65–99)

## 2018-02-07 MED ORDER — DEXTROSE 50 % IV SOLN
INTRAVENOUS | Status: AC
  Administered 2018-02-07: 50 mL
  Filled 2018-02-07: qty 50

## 2018-02-07 MED ORDER — ACETAMINOPHEN 650 MG RE SUPP: 650.0000 mg | Freq: Four times a day (QID) | RECTAL | Status: DC | PRN

## 2018-02-07 MED ORDER — SODIUM CHLORIDE 0.9 % IV SOLN
1.0000 g | Freq: Once | INTRAVENOUS | Status: AC
  Administered 2018-02-07: 1 g via INTRAVENOUS
  Filled 2018-02-07: qty 10

## 2018-02-07 MED ORDER — SODIUM CHLORIDE 0.9 % IV BOLUS
1000.0000 mL | Freq: Once | INTRAVENOUS | Status: AC
  Administered 2018-02-07: 1000 mL via INTRAVENOUS

## 2018-02-07 MED ORDER — MORPHINE SULFATE (PF) 4 MG/ML IV SOLN
1.0000 mg | INTRAVENOUS | Status: DC | PRN
Start: 2018-02-07 — End: 2018-02-11

## 2018-02-07 MED ORDER — ONDANSETRON HCL 4 MG PO TABS: 4.0000 mg | ORAL_TABLET | Freq: Four times a day (QID) | ORAL | Status: DC | PRN

## 2018-02-07 MED ORDER — LORAZEPAM 2 MG/ML IJ SOLN: 0.5000 mg | Freq: Four times a day (QID) | INTRAMUSCULAR | Status: DC | PRN

## 2018-02-07 MED ORDER — SODIUM CHLORIDE 0.9 % IV SOLN
500.0000 mg | Freq: Two times a day (BID) | INTRAVENOUS | Status: DC
  Administered 2018-02-07 – 2018-02-10 (×7): 500 mg via INTRAVENOUS
  Filled 2018-02-07 (×8): qty 0.5

## 2018-02-07 MED ORDER — VALPROATE SODIUM 500 MG/5ML IV SOLN
250.0000 mg | Freq: Two times a day (BID) | INTRAVENOUS | Status: DC
  Administered 2018-02-08 – 2018-02-09 (×5): 250 mg via INTRAVENOUS
  Filled 2018-02-07 (×6): qty 2.5

## 2018-02-07 MED ORDER — DEXTROSE-NACL 5-0.45 % IV SOLN
INTRAVENOUS | Status: DC
  Administered 2018-02-07: 23:00:00 via INTRAVENOUS

## 2018-02-07 MED ORDER — ONDANSETRON HCL 4 MG/2ML IJ SOLN
4.0000 mg | Freq: Four times a day (QID) | INTRAMUSCULAR | Status: DC | PRN
  Administered 2018-02-09: 4 mg via INTRAVENOUS
  Filled 2018-02-07 (×2): qty 2

## 2018-02-07 MED ORDER — ACETAMINOPHEN 325 MG PO TABS
650.0000 mg | ORAL_TABLET | Freq: Four times a day (QID) | ORAL | Status: DC | PRN
  Administered 2018-02-10 (×2): 650 mg via ORAL
  Filled 2018-02-07 (×2): qty 2

## 2018-02-07 NOTE — ED Provider Notes (Signed)
[facility] EMERGENCY DEPARTMENT Provider Note   CSN: [id number] Arrival date & time: 02/07/18  1445     History   Chief Complaint Chief Complaint  Patient presents with  . Altered Mental Status    HPI Megan [name] is a 84 y.o. female.  HPI   Patient is a 84 year old female with advanced Lewy body dementia as well as history of stroke, seizure.  Patient has had multiple aspiration pneumonias in the past.  She was recently started on home hospice last week.  Patient's brought in by her daughter and main caretaker.  She reports she thinks that she is dehydrated because patient has stopped eating.  According to daughter patient had difficulty swallowing increasingly as well as different difficulty phonating due to her advanced Lewy body dementia.  Home hospice did not ran a urine on her yesterday and found out the results to be positive the prescribed antibiotic that required swelling which patient cannot do.  Patient's daughter brought her here for fluids and antibiotics.  Had extensive discussion about what the goals of care are for this female, patient family would like to move forward with labs fluids and IV antibiotics at this time.  Past Medical History:  Diagnosis Date  . Dementia   . Diabetes mellitus   . Hyperlipidemia   . Pneumonia 10/2017  . Seizure (HCC)   . Sepsis (HCC) 11/24/2017  . Stroke [facility]) 2012   possible stroke Jan 2016  . UTI (urinary tract infection) 10/2017    Patient Active Problem List   Diagnosis Date Noted  . Dysphagia   . Goals of care, counseling/discussion   . Sepsis (HCC) 11/11/2017  . Aspiration pneumonia due to gastric secretions (HCC)   . Encounter for hospice care discussion   . Palliative care by specialist   . Increased ammonia level 11/01/2017  . Acute metabolic encephalopathy 11/01/2017  . HCAP (healthcare-associated pneumonia) 11/01/2017  . Pressure injury of skin 10/26/2017  . UTI (urinary tract infection)  10/26/2017  . Heel ulcer (HCC) 06/18/2017  . Unstageable pressure ulcer of sacral region (HCC) 06/18/2017  . Decubitus ulcer of sacral region, stage 2   . Idiopathic hypotension   . Seizure (HCC) 05/21/2017  . Acute renal failure superimposed on stage 3 chronic kidney disease (HCC) 10/23/2014  . Chest pain 04/24/2014  . Altered mental status 08/08/2013  . Dementia with Lewy bodies 08/07/2013  . Vascular dementia 08/07/2013  . Sleepiness 08/07/2013  . Anemia 07/21/2013  . Protein-calorie malnutrition, severe (HCC) 07/15/2013  . Herpes zoster 07/13/2013  . Spastic hemiplegia affecting dominant side (HCC) 06/25/2013  . History of CVA w/residual right side deficits 09/03/2012  . History of partial seizures   . TIA (transient ischemic attack) 01/06/2012  . Right upper lobe pneumonia (HCC) 01/06/2012  . Diabetes mellitus type II, uncontrolled (HCC) 01/06/2012  . HTN (hypertension) 01/06/2012  . H/O 01/06/2012    Past Surgical History:  Procedure Laterality Date  . CATARACT EXTRACTION       OB History   None      Home Medications    Prior to Admission medications   Medication Sig Start Date End Date Taking? Authorizing Provider  acetaminophen (TYLENOL) 325 MG tablet Take 2 tablets (650 mg total) by mouth every 6 (six) hours as needed for mild pain (or Fever >/= 101). 11/04/17  Yes Alwyn Ren, MD  Amino Acids-Protein Hydrolys (FEEDING SUPPLEMENT, PRO-STAT SUGAR FREE 64,) LIQD Take 30 mLs by mouth 4 (four) times daily -  with meals and at bedtime. 10/28/17  Yes Narda Bonds, MD  aspirin EC 81 MG tablet Take 1 tablet (81 mg total) by mouth daily. 11/17/17 11/17/18 Yes Regalado, Belkys A, MD  bisacodyl (DULCOLAX) 10 MG suppository Place 10 mg rectally every three (3) days as needed for constipation. 01/31/18  Yes [provider]  clopidogrel (PLAVIX) 75 MG tablet Take 1 tablet (75 mg total) by mouth daily with breakfast. 08/15/13  Yes [name], MD  divalproex  (DEPAKOTE SPRINKLE) 125 MG capsule Take 250 mg by mouth 2 (two) times daily. 02/03/18  Yes [provider]  famotidine (PEPCID) 20 MG tablet Take 1 tablet (20 mg total) by mouth daily. 10/29/17  Yes Narda Bonds, MD  ferrous sulfate 325 (65 FE) MG tablet Take 325 mg by mouth daily with breakfast.   Yes [provider]  Insulin Glargine (TOUJEO MAX SOLOSTAR) 300 UNIT/ML SOPN Inject 5-9 Units into the skin at bedtime.   Yes [provider]  lactobacillus acidophilus (BACID) TABS tablet Take 2 tablets by mouth 3 (three) times daily. 11/17/17  Yes Regalado, Belkys A, MD  loperamide (IMODIUM A-D) 2 MG tablet Take 1 tablet (2 mg total) by mouth 4 (four) times daily as needed for diarrhea or loose stools. 11/17/17  Yes Regalado, Belkys A, MD  Melatonin 3 MG TABS Take 1 tablet (3 mg total) by mouth at bedtime as needed (sleep). 11/17/17  Yes Regalado, Belkys A, MD  memantine (NAMENDA XR) 14 MG CP24 24 hr capsule Take 14 mg by mouth at bedtime.    Yes [provider]  Multiple Vitamin (MULTIVITAMIN WITH MINERALS) TABS tablet Take 1 tablet by mouth daily. 11/18/17  Yes Regalado, Belkys A, MD  NYSTATIN powder Apply 1 application topically 2 (two) times daily as needed for rash. 05/04/17  Yes [provider]  ondansetron (ZOFRAN ODT) 4 MG disintegrating tablet Take 1 tablet (4 mg total) by mouth every 8 (eight) hours as needed. Patient taking differently: Take 4 mg by mouth every 8 (eight) hours as needed for nausea.  11/23/17  Yes Mabe, Latanya Maudlin, MD  oxyCODONE (OXY IR/ROXICODONE) 5 MG immediate release tablet Take 2.5 mg by mouth every 4 (four) hours as needed for pain. 01/26/18  Yes [provider]  protein supplement shake (PREMIER PROTEIN) LIQD Take 59.1 mLs (2 oz total) by mouth 4 (four) times daily -  with meals and at bedtime. Patient taking differently: Take 2 oz by mouth 3 (three) times daily with meals.  10/28/17  Yes Narda Bonds, MD  rosuvastatin (CRESTOR)  5 MG tablet Take 5 mg by mouth every Monday, Wednesday, and Friday. At bedtime   Yes [provider]  vitamin C (ASCORBIC ACID) 500 MG tablet Take 500 mg by mouth daily.   Yes [provider]  zinc oxide (BALMEX) 11.3 % CREA cream Apply 1 application topically daily as needed (for rash).    Yes [provider]  budesonide (PULMICORT) 0.25 MG/2ML nebulizer solution Take 2 mLs (0.25 mg total) by nebulization 2 (two) times daily. Patient not taking: Reported on 02/07/2018 11/17/17   Regalado, Jon Billings A, MD  guaiFENesin (ROBITUSSIN) 100 MG/5ML SOLN Take 10 mLs (200 mg total) by mouth 4 (four) times daily. Patient not taking: Reported on 02/07/2018 11/17/17   Regalado, Jon Billings A, MD  insulin aspart (NOVOLOG) 100 UNIT/ML injection Inject 2 Units into the skin 3 (three) times daily with meals. Please inject 2 units if patient eats more than 50 %  meals and if Blood sugar above 160 Patient not taking: Reported on 12/05/2017 11/17/17   Regalado, Jon BillingsBelkys A, MD  levalbuterol (XOPENEX) 0.63 MG/3ML nebulizer solution Take 3 mLs (0.63 mg total) by nebulization 3 (three) times daily. Patient not taking: Reported on 02/07/2018 11/17/17   Hartley Barefootegalado, Belkys A, MD  Valproate Sodium (DEPAKENE) 250 MG/5ML SOLN solution Take 5 mLs (250 mg total) by mouth 2 (two) times daily. Patient not taking: Reported on 02/07/2018 11/17/17   Alba Coryegalado, Belkys A, MD    Family History Family History  Problem Relation Age of Onset  . CAD Mother   . Hypertension Mother   . Diabetes Mother   . Stroke Mother     Social History Social History   Tobacco Use  . Smoking status: Never Smoker  . Smokeless tobacco: Former NeurosurgeonUser    Types: Snuff  . Tobacco comment: quit 50-60 years ago  Substance Use Topics  . Alcohol use: No    Alcohol/week: 0.0 oz  . Drug use: No     Allergies   Lipitor [atorvastatin]   Review of Systems Review of Systems  Unable to perform ROS: Dementia     Physical Exam Updated Vital  Signs BP (!) 96/50   Pulse (!) 123   Temp 97.6 F (36.4 C) (Oral)   Resp 18   Ht 5\' 2"  (1.575 m)   Wt 63.5 kg (140 lb)   SpO2 94%   BMI 25.61 kg/m   Physical Exam  Constitutional: She appears well-developed and well-nourished. No distress.  Cachectic elderly female curled in the bed, nonverbal.  HENT:  Head: Normocephalic and atraumatic.  Eyes: Right eye exhibits no discharge. Left eye exhibits no discharge.  Cardiovascular: Normal rate and regular rhythm.  Pulmonary/Chest: Effort normal and breath sounds normal.  Abdominal: There is no tenderness.  Neurological:  Nonverbal, eyes closed.  Skin: Skin is warm and dry. She is not diaphoretic.  Nursing note and vitals reviewed.    ED Treatments / Results  Labs (all labs ordered are listed, but only abnormal results are displayed) Labs Reviewed  COMPREHENSIVE METABOLIC PANEL - Abnormal; Notable for the following components:      Result Value   Sodium 156 (*)    Chloride 123 (*)    BUN 70 (*)    Creatinine, Ser 2.03 (*)    Albumin 2.7 (*)    GFR calc non Af Amer 20 (*)    GFR calc Af Amer 23 (*)    All other components within normal limits  CBC WITH DIFFERENTIAL/PLATELET - Abnormal; Notable for the following components:   WBC 10.6 (*)    RBC 5.79 (*)    Hemoglobin 16.7 (*)    HCT 54.7 (*)    RDW 18.0 (*)    Platelets 117 (*)    All other components within normal limits    EKG None  Radiology Dg Chest 2 View  Result Date: 02/07/2018 CLINICAL DATA:  Weakness for 3 days, diabetes mellitus, pneumonia EXAM: CHEST - 2 VIEW COMPARISON:  12/05/2017, 06/18/2017 FINDINGS: Normal heart size, mediastinal contours, and pulmonary vascularity. Atherosclerotic calcifications aorta. Chronic bibasilar infiltrates slightly greater on LEFT question fibrosis. No definite acute infiltrate, pleural effusion or pneumothorax. New diffuse osseous demineralization. IMPRESSION: Chronic bibasilar interstitial changes likely representing  pulmonary fibrosis. No definite acute infiltrate. Electronically Signed   By: Ulyses SouthwardMark  Boles M.D.   On: 02/07/2018 18:04    Procedures Procedures (including critical care time)  Medications Ordered in ED Medications  sodium  chloride 0.9 % bolus 1,000 mL (0 mLs Intravenous Stopped 02/07/18 1749)  cefTRIAXone (ROCEPHIN) 1 g in sodium chloride 0.9 % 100 mL IVPB (0 g Intravenous Stopped 02/07/18 1720)     Initial Impression / Assessment and Plan / ED Course  I have reviewed the triage vital signs and the nursing notes.  Pertinent labs & imaging results that were available during my care of the patient were reviewed by me and considered in my medical decision making (see chart for details).     Patient is a 84 year old female with advanced Lewy body dementia as well as history of stroke, seizure.  Patient has had multiple aspiration pneumonias in the past.  She was recently started on home hospice last week.  Patient's brought in by her daughter and main caretaker.  She reports she thinks that she is dehydrated because patient has stopped eating.  According to daughter patient had difficulty swallowing increasingly as well as different difficulty phonating due to her advanced Lewy body dementia.  Home hospice did not ran a urine on her yesterday and found out the results to be positive the prescribed antibiotic that required swelling which patient cannot do.  Patient's daughter brought her here for fluids and antibiotics.  Had extensive discussion about what the goals of care are for this female, patient family would like to move forward with labs fluids and IV antibiotics at this time.   3:48 PM We will move forward with patient's family's wishes to give IV fluids, give 1 order of ceftriaxone.  Will be able to provide her with liquid antibiotics that hopefully family can encourage home.  7:16 PM Patient's labs show hypernatremia, and acute kidney failure.  Had a long discussion with family about  what their wishes were.  Patient's family would like to proceed with IV fluids.  Will admit for fluids and AKI.   Final Clinical Impressions(s) / ED Diagnoses   Final diagnoses:  None    ED Discharge Orders    None       Abelino Derrick, MD 02/07/18 1918

## 2018-02-07 NOTE — ED Notes (Signed)
Attempted report 

## 2018-02-07 NOTE — H&P (Signed)
History and Physical    Megan [name] WUJ:[id number] DOB: 09-18-1921 DOA: 02/07/2018  PCP: [name], MD   Patient coming from: Home  Chief Complaint: Not eating or drinking, not talking, abnormal outpatient UA   HPI: Megan [name] is a 84 y.o. female with medical history significant for history of CVA with residual deficits, Lewy body dementia, seizure disorder, hypertension, insulin-dependent diabetes mellitus, and recent UTI with ESBL, now presenting to the emergency department after not speaking, eating, or drinking for the past few days.  Patient has advanced dementia, has recently enrolled in hospice, but  had reportedly been speaking, eating, and drinking until approximately 3 days ago.  Family reports that hospice nurse sent a UA, later notified them that this was indicative of infection, and an antibiotic was called in.  Patient has been difficult to rouse, refusing food or drink, unable to take an oral antibiotic, and so family brought her into the ED.  The patient's daughters at the bedside understand that their mother has advanced dementia and may have stopped eating and drinking due to this, but would like a trial of antibiotics and IV fluid hydration to see if she improves.  ED Course: Upon arrival to the ED, patient is found to be afebrile, saturating well on room air, tachycardic in the 120s, and with blood pressure 96/50.  Chemistry panel is notable for sodium of 156, BUN 70, and creatinine of 2.03, up from 0.73 in April.  CBC features a slight leukocytosis and mild polycythemia with hemoglobin 16.7, up from 9.9 in April.  There is a thrombocytopenia with platelets 117,000.  Patient was given a liter of normal saline and 1 g of IV Rocephin in the ED.  Tachycardia and blood pressure improved with the IV fluids and the patient will be admitted for ongoing evaluation and management.   Review of Systems:  All other systems reviewed and apart from HPI, are negative.  Past Medical History:    Diagnosis Date  . Dementia   . Diabetes mellitus   . Hyperlipidemia   . Pneumonia 10/2017  . Seizure (HCC)   . Sepsis (HCC) 11/24/2017  . Stroke [facility]) 2012   possible stroke Jan 2016  . UTI (urinary tract infection) 10/2017    Past Surgical History:  Procedure Laterality Date  . CATARACT EXTRACTION       reports that she has never smoked. She has quit using smokeless tobacco. Her smokeless tobacco use included snuff. She reports that she does not drink alcohol or use drugs.  Allergies  Allergen Reactions  . Lipitor [Atorvastatin] Other (See Comments)    Myalgias     Family History  Problem Relation Age of Onset  . CAD Mother   . Hypertension Mother   . Diabetes Mother   . Stroke Mother      Prior to Admission medications   Medication Sig Start Date End Date Taking? Authorizing Provider  acetaminophen (TYLENOL) 325 MG tablet Take 2 tablets (650 mg total) by mouth every 6 (six) hours as needed for mild pain (or Fever >/= 101). 11/04/17  Yes Alwyn Ren, MD  Amino Acids-Protein Hydrolys (FEEDING SUPPLEMENT, PRO-STAT SUGAR FREE 64,) LIQD Take 30 mLs by mouth 4 (four) times daily -  with meals and at bedtime. 10/28/17  Yes Narda Bonds, MD  aspirin EC 81 MG tablet Take 1 tablet (81 mg total) by mouth daily. 11/17/17 11/17/18 Yes Regalado, Belkys A, MD  bisacodyl (DULCOLAX) 10 MG suppository Place 10 mg rectally every  three (3) days as needed for constipation. 01/31/18  Yes [provider]  clopidogrel (PLAVIX) 75 MG tablet Take 1 tablet (75 mg total) by mouth daily with breakfast. 08/15/13  Yes [name], John, MD  divalproex (DEPAKOTE SPRINKLE) 125 MG capsule Take 250 mg by mouth 2 (two) times daily. 02/03/18  Yes [provider]  famotidine (PEPCID) 20 MG tablet Take 1 tablet (20 mg total) by mouth daily. 10/29/17  Yes Narda BondsNettey, Ralph A, MD  ferrous sulfate 325 (65 FE) MG tablet Take 325 mg by mouth daily with breakfast.   Yes [provider]   Insulin Glargine (TOUJEO MAX SOLOSTAR) 300 UNIT/ML SOPN Inject 5-9 Units into the skin at bedtime.   Yes [provider]  lactobacillus acidophilus (BACID) TABS tablet Take 2 tablets by mouth 3 (three) times daily. 11/17/17  Yes Regalado, Belkys A, MD  loperamide (IMODIUM A-D) 2 MG tablet Take 1 tablet (2 mg total) by mouth 4 (four) times daily as needed for diarrhea or loose stools. 11/17/17  Yes Regalado, Belkys A, MD  Melatonin 3 MG TABS Take 1 tablet (3 mg total) by mouth at bedtime as needed (sleep). 11/17/17  Yes Regalado, Belkys A, MD  memantine (NAMENDA XR) 14 MG CP24 24 hr capsule Take 14 mg by mouth at bedtime.    Yes [provider]  Multiple Vitamin (MULTIVITAMIN WITH MINERALS) TABS tablet Take 1 tablet by mouth daily. 11/18/17  Yes Regalado, Belkys A, MD  NYSTATIN powder Apply 1 application topically 2 (two) times daily as needed for rash. 05/04/17  Yes [provider]  ondansetron (ZOFRAN ODT) 4 MG disintegrating tablet Take 1 tablet (4 mg total) by mouth every 8 (eight) hours as needed. Patient taking differently: Take 4 mg by mouth every 8 (eight) hours as needed for nausea.  11/23/17  Yes Mabe, Latanya MaudlinMartha L, MD  oxyCODONE (OXY IR/ROXICODONE) 5 MG immediate release tablet Take 2.5 mg by mouth every 4 (four) hours as needed for pain. 01/26/18  Yes [provider]  protein supplement shake (PREMIER PROTEIN) LIQD Take 59.1 mLs (2 oz total) by mouth 4 (four) times daily -  with meals and at bedtime. Patient taking differently: Take 2 oz by mouth 3 (three) times daily with meals.  10/28/17  Yes Narda BondsNettey, Ralph A, MD  rosuvastatin (CRESTOR) 5 MG tablet Take 5 mg by mouth every Monday, Wednesday, and Friday. At bedtime   Yes [provider]  vitamin C (ASCORBIC ACID) 500 MG tablet Take 500 mg by mouth daily.   Yes [provider]  zinc oxide (BALMEX) 11.3 % CREA cream Apply 1 application topically daily as needed (for rash).    Yes [provider]    Physical Exam: Vitals:   02/07/18 1845 02/07/18 1850 02/07/18 1900 02/07/18 1930  BP: 115/70 115/70 (!) 96/50 116/67  Pulse:  (!) 123    Resp: 18 18 18 17   Temp:      TempSrc:      SpO2:  94%    Weight:      Height:          Constitutional: No apparent respiratory distress, obtunded  Eyes: PERTLA, lids and conjunctivae normal ENMT: Mucous membranes are dry. Posterior pharynx clear of any exudate or lesions.   Neck: normal, supple, no masses, no thyromegaly Respiratory: clear to auscultation bilaterally, no wheezing, no crackles. Normal respiratory effort.   Cardiovascular: Rate ~110 and regular. No extremity edema. No significant JVD. Abdomen: No distension, no tenderness, soft.  Bowel sounds active.  Musculoskeletal: no clubbing / cyanosis. No joint deformity upper and lower extremities.   Skin: no significant rashes, lesions, ulcers on exposed surfaces. Poor turgor. Neurologic: Obtunded. No gross facial asymmetry. Babinski response downgoing bilaterally.    Labs on Admission: I have personally reviewed following labs and imaging studies  CBC: Recent Labs  Lab 02/07/18 1633  WBC 10.6*  NEUTROABS 5.6  HGB 16.7*  HCT 54.7*  MCV 94.5  PLT 117*   Basic Metabolic Panel: Recent Labs  Lab 02/07/18 1633  NA 156*  K 4.4  CL 123*  CO2 24  GLUCOSE 71  BUN 70*  CREATININE 2.03*  CALCIUM 10.0   GFR: Estimated Creatinine Clearance: 14.8 mL/min (A) (by C-G formula based on SCr of 2.03 mg/dL (H)). Liver Function Tests: Recent Labs  Lab 02/07/18 1633  AST 30  ALT 33  ALKPHOS 219*  BILITOT 0.4  PROT 7.6  ALBUMIN 2.7*   No results for input(s): LIPASE, AMYLASE in the last 168 hours. No results for input(s): AMMONIA in the last 168 hours. Coagulation Profile: No results for input(s): INR, PROTIME in the last 168 hours. Cardiac Enzymes: No results for input(s): CKTOTAL, CKMB, CKMBINDEX, TROPONINI in the last 168 hours. BNP (last 3 results) No results for  input(s): PROBNP in the last 8760 hours. HbA1C: No results for input(s): HGBA1C in the last 72 hours. CBG: No results for input(s): GLUCAP in the last 168 hours. Lipid Profile: No results for input(s): CHOL, HDL, LDLCALC, TRIG, CHOLHDL, LDLDIRECT in the last 72 hours. Thyroid Function Tests: No results for input(s): TSH, T4TOTAL, FREET4, T3FREE, THYROIDAB in the last 72 hours. Anemia Panel: No results for input(s): VITAMINB12, FOLATE, FERRITIN, TIBC, IRON, RETICCTPCT in the last 72 hours. Urine analysis:    Component Value Date/Time   COLORURINE YELLOW 12/05/2017 1553   APPEARANCEUR CLEAR 12/05/2017 1553   LABSPEC 1.016 12/05/2017 1553   PHURINE 6.0 12/05/2017 1553   GLUCOSEU NEGATIVE 12/05/2017 1553   HGBUR NEGATIVE 12/05/2017 1553   BILIRUBINUR NEGATIVE 12/05/2017 1553   KETONESUR NEGATIVE 12/05/2017 1553   PROTEINUR NEGATIVE 12/05/2017 1553   UROBILINOGEN 0.2 10/23/2014 0830   NITRITE POSITIVE (A) 12/05/2017 1553   LEUKOCYTESUR MODERATE (A) 12/05/2017 1553   Sepsis Labs: @LABRCNTIP (procalcitonin:4,lacticidven:4) )No results found for this or any previous visit (from the past 240 hour(s)).   Radiological Exams on Admission: Dg Chest 2 View  Result Date: 02/07/2018 CLINICAL DATA:  Weakness for 3 days, diabetes mellitus, pneumonia EXAM: CHEST - 2 VIEW COMPARISON:  12/05/2017, 06/18/2017 FINDINGS: Normal heart size, mediastinal contours, and pulmonary vascularity. Atherosclerotic calcifications aorta. Chronic bibasilar infiltrates slightly greater on LEFT question fibrosis. No definite acute infiltrate, pleural effusion or pneumothorax. New diffuse osseous demineralization. IMPRESSION: Chronic bibasilar interstitial changes likely representing pulmonary fibrosis. No definite acute infiltrate. Electronically Signed   By: Ulyses Southward M.D.   On: 02/07/2018 18:04    EKG: Not performed.   Assessment/Plan  1. Acute encephalopathy  - Patient has advanced dementia on hospice, but had  been talking and eating until 3-4 days ago  - Family reports being told that outpatient UA was indicative of infection  - Suspect the recent decline secondary to UTI superimposed on advanced dementia - Check UA and send culture; continue empiric tx with meropenem for now given recent ESBL and pseudomonas in urine  - Continue supportive care   2. UTI  - Family reports that UA was sent by hospice RN, they were notified that it is indicative of  infection and called-in an antibiotic, but patient is not taking anything by mouth, so family brought her to ED  - She had ESBL in urine in April 2019, pseudomonal UTI prior to that  - Check UA and send culture  - Treat with meropenem for now, follow culture and clinical course   3. AKI   - SCr is 2.03 on admission, up from 0.7 in April  - Likely prerenal in setting of advanced dementia, possible UTI, and not eating or drinking  - Treated in ED with 1 liter NS and continued on IVF hydration  - Renally-dose medications, avoid nephrotoxins, and repeat chem panel in am    4. Hypernatremia  - Serum sodium is 156 on admission  - Secondary to dehydration  - Treated with 1 liter NS in ED  - Continue IVF hydration with D5%-1/2 NS, repeat chem panel in am    5. Type II DM  - A1c was 8.6% in March  - Has not been eating or drinking in 3 days and serum glucose 71 on admission  - Hold insulin, check CBG's    6. Hypertension  - BP at goal  - Use IV agent as needed for now    7. Dementia - Family reports that she is verbal at baseline  - Resume Namenda when appropriate for diet   8. Seizure disorder  - No apparent seizure activity  - Continue Depakote, convert to IV for now    DVT prophylaxis: SCD's  Code Status: DNR  Family Communication: Daughters updated at bedside Consults called: None Admission status: Inpatient    Briscoe Deutscher, MD [facility] Pager [phone]  If 7PM-7AM, please contact  night-coverage [email or web address] Password [facility]  02/07/2018, 7:46 PM

## 2018-02-07 NOTE — Progress Notes (Signed)
CRITICAL VALUE STICKER  CRITICAL VALUE: BG [phone]  RECEIVER (on-site recipient of call):   DATE & TIME NOTIFIED: 2118 02/07/18  MESSENGER (representative from lab):  MD NOTIFIED:   TIME OF NOTIFICATION:  RESPONSE: Treated per protocol with D50. Will recheck. Will txt page to change BG from q8h to q4h.

## 2018-02-07 NOTE — ED Triage Notes (Signed)
Pt arrived via [facility] from home where pt lives with family. Family reports pt has had decreased PO intake and increased weakness X3 days. Home health aid sent urine sample yesterday which came back today with confirmed UTI. Antibiotics were called in, none given yet. Pt has dementia baseline per family but is usually verbal at baseline.

## 2018-02-07 NOTE — ED Notes (Signed)
Purewick applied at 14:50.

## 2018-02-07 NOTE — Progress Notes (Signed)
Pharmacy Antibiotic Note  Megan PersonRuby [name] is a 84 y.o. female admitted on 02/07/2018 with reduce PO intake and altered mental status. Pt found to have AKI, with SCr up to 2, eCrCl ~15 ml/min and to be hypernatremic. Reportedly, home health nurse did a UA which indicated a UTI. Patient received one dose of ceftriaxone earlier this evening.   Plan: Meropenem 500 mg IV q12h  Recommend deescalating as soon as feasible, due to potential of meropenem reducing seizure threshold Monitor renal fx and cultures  Height: 5\' 2"  (157.5 cm) Weight: 140 lb (63.5 kg) IBW/kg (Calculated) : 50.1  Temp (24hrs), Avg:97.6 F (36.4 C), Min:97.6 F (36.4 C), Max:97.6 F (36.4 C)  Recent Labs  Lab 02/07/18 1633  WBC 10.6*  CREATININE 2.03*    Estimated Creatinine Clearance: 14.8 mL/min (A) (by C-G formula based on SCr of 2.03 mg/dL (H)).    Allergies  Allergen Reactions  . Lipitor [Atorvastatin] Other (See Comments)    Myalgias     Antimicrobials this admission: 6/18 ceftriaxone x1 6/18 meropenem >   Microbiology results: 6/18 urine cx: ip  Baldemar FridayMasters, Raine Elsass M 02/07/2018 8:18 PM

## 2018-02-07 NOTE — ED Notes (Signed)
Attempted Report 

## 2018-02-08 LAB — BASIC METABOLIC PANEL
Anion gap: 7 (ref 5–15)
BUN: 49 mg/dL — AB (ref 6–20)
CALCIUM: 9.1 mg/dL (ref 8.9–10.3)
CO2: 24 mmol/L (ref 22–32)
CREATININE: 1.32 mg/dL — AB (ref 0.44–1.00)
Chloride: 122 mmol/L — ABNORMAL HIGH (ref 101–111)
GFR calc Af Amer: 39 mL/min — ABNORMAL LOW (ref >60)
GFR calc non Af Amer: 33 mL/min — ABNORMAL LOW (ref >60)
GLUCOSE: 179 mg/dL — AB (ref 65–99)
Potassium: 4 mmol/L (ref 3.5–5.1)
Sodium: 153 mmol/L — ABNORMAL HIGH (ref 135–145)

## 2018-02-08 LAB — CBC WITH DIFFERENTIAL/PLATELET
ABS IMMATURE GRANULOCYTES: 0.1 10*3/uL (ref 0.0–0.1)
BASOS PCT: 0 %
Basophils Absolute: 0 10*3/uL (ref 0.0–0.1)
EOS ABS: 0.2 10*3/uL (ref 0.0–0.7)
EOS PCT: 2 %
HCT: 47.4 % — ABNORMAL HIGH (ref 36.0–46.0)
Hemoglobin: 14.3 g/dL (ref 12.0–15.0)
Immature Granulocytes: 1 %
Lymphocytes Relative: 33 %
Lymphs Abs: 2.8 10*3/uL (ref 0.7–4.0)
MCH: 28.8 pg (ref 26.0–34.0)
MCHC: 30.2 g/dL (ref 30.0–36.0)
MCV: 95.6 fL (ref 78.0–100.0)
MONOS PCT: 7 %
Monocytes Absolute: 0.6 10*3/uL (ref 0.1–1.0)
Neutro Abs: 4.9 10*3/uL (ref 1.7–7.7)
Neutrophils Relative %: 57 %
PLATELETS: 129 10*3/uL — AB (ref 150–400)
RBC: 4.96 MIL/uL (ref 3.87–5.11)
RDW: 17.2 % — AB (ref 11.5–15.5)
WBC: 8.6 10*3/uL (ref 4.0–10.5)

## 2018-02-08 LAB — URINALYSIS, ROUTINE W REFLEX MICROSCOPIC
BILIRUBIN URINE: NEGATIVE
Glucose, UA: 150 mg/dL — AB
Ketones, ur: 5 mg/dL — AB
NITRITE: NEGATIVE
Protein, ur: 30 mg/dL — AB
SPECIFIC GRAVITY, URINE: 1.016 (ref 1.005–1.030)
pH: 6 (ref 5.0–8.0)

## 2018-02-08 LAB — GLUCOSE, CAPILLARY
GLUCOSE-CAPILLARY: 192 mg/dL — AB (ref 65–99)
GLUCOSE-CAPILLARY: 141 mg/dL — AB (ref 65–99)
GLUCOSE-CAPILLARY: 240 mg/dL — AB (ref 65–99)
Glucose-Capillary: 128 mg/dL — ABNORMAL HIGH (ref 65–99)
Glucose-Capillary: 180 mg/dL — ABNORMAL HIGH (ref 65–99)

## 2018-02-08 LAB — VALPROIC ACID LEVEL: Valproic Acid Lvl: <10 ug/mL — ABNORMAL LOW (ref 50.0–100.0)

## 2018-02-08 MED ORDER — INSULIN ASPART 100 UNIT/ML ~~LOC~~ SOLN
0.0000 [IU] | Freq: Three times a day (TID) | SUBCUTANEOUS | Status: DC
  Administered 2018-02-08: 1 [IU] via SUBCUTANEOUS
  Administered 2018-02-08 – 2018-02-09 (×2): 2 [IU] via SUBCUTANEOUS
  Administered 2018-02-09: 5 [IU] via SUBCUTANEOUS
  Administered 2018-02-10 (×2): 3 [IU] via SUBCUTANEOUS
  Administered 2018-02-10: 7 [IU] via SUBCUTANEOUS
  Administered 2018-02-11 (×2): 3 [IU] via SUBCUTANEOUS

## 2018-02-08 MED ORDER — RESOURCE THICKENUP CLEAR PO POWD
ORAL | Status: DC | PRN
  Filled 2018-02-08: qty 125

## 2018-02-08 MED ORDER — HEPARIN SODIUM (PORCINE) 5000 UNIT/ML IJ SOLN
5000.0000 [IU] | Freq: Three times a day (TID) | INTRAMUSCULAR | Status: DC
  Administered 2018-02-08 – 2018-02-11 (×9): 5000 [IU] via SUBCUTANEOUS
  Filled 2018-02-08 (×10): qty 1

## 2018-02-08 MED ORDER — DEXTROSE-NACL 5-0.45 % IV SOLN
INTRAVENOUS | Status: AC
  Administered 2018-02-08: 10:00:00 via INTRAVENOUS
  Filled 2018-02-08 (×3): qty 1000

## 2018-02-08 NOTE — Progress Notes (Signed)
CSW following for discharge plan. CSW spoke with [facility] nurse liaison who provided information that patient is active with hospice, has been home with hospice services.   CSW to continue to follow for needs.  Blenda NicelyElizabeth Cendy Oconnor, [place] Clinical Social Worker [phone]

## 2018-02-08 NOTE — Evaluation (Signed)
Clinical/Bedside Swallow Evaluation Patient Details  Name: Megan [name] MRN: [id number] Date of Birth: 06-20-1922  Today's Date: 02/08/2018 Time: SLP Start Time (ACUTE ONLY): 1118 SLP Stop Time (ACUTE ONLY): 1132 SLP Time Calculation (min) (ACUTE ONLY): 14 min  Past Medical History:  Past Medical History:  Diagnosis Date  . Dementia   . Diabetes mellitus   . Hyperlipidemia   . Pneumonia 10/2017  . Seizure (HCC)   . Sepsis (HCC) 11/24/2017  . Stroke [facility](HCC) 2012   possible stroke Jan 2016  . UTI (urinary tract infection) 10/2017   Past Surgical History:  Past Surgical History:  Procedure Laterality Date  . CATARACT EXTRACTION     HPI:  Patient is a 84 y.o. female with prior history advanced dementia, hypertension, seizure disorder, insulin-dependent DM-2-sent to the ED for failure to thrive syndrome and abnormal UA suggestive of UTI.  Per MD note pt is bed to wheelchair bound and completely dependent on family for activities of daily living.  CXR shows no definite acute infiltrate. MBS on 11/13/17 shows severe oral dysphagia, oral holding, delayed swallow, no penetration or aspiration. Dys 1/thin recommended. Daughter works in Administrator, [employer], cares for pt at home. Follows nectar thick/puree diet.    Assessment / Plan / Recommendation Clinical Impression  Pt demonstrates function consistent with daughters report of baseline oral dysphagia. Pts daughter is a conscientious caregiver. Verbalizes excellent strategies for safe feeding in the setting of dementia and oral holding. She feels pt tolerates nectar thick liquids considerably better than thin. We discussed striking a balance between reducing risk of aspiration with use of thickeners and slow careful feeding, but also preventing dehydration and UTIs from too little oral intake. Pts awareness of PO, automaticity and strength is likely variable each day. Encouraged daugther to offer sips of thin ice water after oral care if pt is alert and  responsive to potentially facilitate hydration with low risk of aspiration. Will initiate a dys 1/nectar thick diet here with thin water allowed after oral care. Daughter verbalized understanding.  SLP Visit Diagnosis: Dysphagia, oropharyngeal phase (R13.12)    Aspiration Risk  Moderate aspiration risk    Diet Recommendation Dysphagia 1 (Puree);Thin liquid;Nectar-thick liquid   Liquid Administration via: Cup;Straw Medication Administration: Crushed with puree Supervision: Full supervision/cueing for compensatory strategies Compensations: Slow rate;Small sips/bites Postural Changes: Seated upright at 90 degrees;Remain upright for at least 30 minutes after po intake    Other  Recommendations Oral Care Recommendations: Oral care BID Other Recommendations: Have oral suction available   Follow up Recommendations None      Frequency and Duration            Prognosis        Swallow Study   General HPI: Patient is a 84 y.o. female with prior history advanced dementia, hypertension, seizure disorder, insulin-dependent DM-2-sent to the ED for failure to thrive syndrome and abnormal UA suggestive of UTI.  Per MD note pt is bed to wheelchair bound and completely dependent on family for activities of daily living.  CXR shows no definite acute infiltrate. MBS on 11/13/17 shows severe oral dysphagia, oral holding, delayed swallow, no penetration or aspiration. Dys 1/thin recommended. Daughter works in Administrator, [employer], cares for pt at home. Follows nectar thick/puree diet.  Type of Study: Bedside Swallow Evaluation Diet Prior to this Study: NPO Temperature Spikes Noted: No Respiratory Status: Room air History of Recent Intubation: No Behavior/Cognition: Alert;Cooperative;Requires cueing Oral Cavity Assessment: Other (comment)(white ptches on lingual surface) Oral Care Completed by SLP: No  Oral Cavity - Dentition: Edentulous Self-Feeding Abilities: Total assist Patient Positioning: Upright in  bed Baseline Vocal Quality: Not observed Volitional Cough: Cognitively unable to elicit    Oral/Motor/Sensory Function Overall Oral Motor/Sensory Function: Generalized oral weakness   Ice Chips Ice chips: Impaired Presentation: Spoon Oral Phase Impairments: Reduced lingual movement/coordination;Reduced labial seal Oral Phase Functional Implications: Right anterior spillage;Prolonged oral transit   Thin Liquid Thin Liquid: Impaired Presentation: Cup;Straw;Spoon Oral Phase Functional Implications: Prolonged oral transit Pharyngeal  Phase Impairments: Suspected delayed Swallow    Nectar Thick Nectar Thick Liquid: Not tested   Honey Thick Honey Thick Liquid: Not tested   Puree Puree: Impaired Presentation: Spoon Oral Phase Functional Implications: Right lateral sulci pocketing;Prolonged oral transit   Solid   GO   Solid: Not tested       Harlon Ditty, MA CCC-SLP [phone]  [name], Riley Nearing 02/08/2018,12:12 PM

## 2018-02-08 NOTE — Progress Notes (Signed)
PROGRESS NOTE        PATIENT DETAILS Name: Megan [name] Age: 84 y.o. Sex: female Date of Birth: 1922-02-02 Admit Date: 02/07/2018 Admitting Physician Briscoe Deutscher, MD UEA:[id number], [name], MD  Brief Narrative: Patient is a 84 y.o. female with prior history advanced dementia, hypertension, seizure disorder, insulin-dependent DM-2-sent to the ED for failure to thrive syndrome and abnormal UA suggestive of UTI.  Found to have acute renal failure and hyponatremia.  See below for further details.  Subjective: Mostly nonverbal while I was in there-but was moving her head and tracking my movements.  Daughter at bedside.  Assessment/Plan: Acute metabolic encephalopathy: Multifactorial etiology-secondary to UTI, hyponatremia and acute kidney injury in a background of advanced dementia.  Her daughter-she appears much more alert-but not yet back to her baseline.  Continue.  Antibiotics, IV fluid hydration-likely she will improve with these supportive measures-if she does not improve over the next few days-may need to talk about transitioning to full hospice care.  She was followed by hospice at home.  Acute kidney injury: Hemodynamically mediated-secondary to poor oral intake, slowly improving with gentle IV fluids.  Hypernatremia: Secondary to poor oral intake-slowly improving with IV fluids.  Follow electrolytes.  Complicated UTI: Has prior history of ESBL Klebsiella pneumoniae-continue meropenem.  Await cultures.  Insulin-dependent DM-2: CBG stable-continue SSI-hold long-acting Lantus intake has been resumed.  History of seizure disorder: Continue Depakote-check level  Advanced dementia: At risk for delirium-nonambulatory-not sure how verbal she is (was nonverbal during my evaluation) bed to wheelchair bound and completely dependent on family for activities of daily living.  Currently n.p.o.-once speech therapy evaluation is done-we will resume her usual dementia  medications.  DVT Prophylaxis: Prophylactic Heparin  Code Status:  DNR  Family Communication: Daughter at bedside  Disposition Plan: Remain inpatient-hopefully back home with hospice care in the next few days.  Antimicrobial agents: Anti-infectives (From admission, onward)   Start     Dose/Rate Route Frequency Ordered Stop   02/07/18 2200  meropenem (MERREM) 500 mg in sodium chloride 0.9 % 100 mL IVPB     500 mg 200 mL/hr over 30 Minutes Intravenous Every 12 hours 02/07/18 2015     02/07/18 1600  cefTRIAXone (ROCEPHIN) 1 g in sodium chloride 0.9 % 100 mL IVPB     1 g 200 mL/hr over 30 Minutes Intravenous  Once 02/07/18 1545 02/07/18 1720      Procedures: None  CONSULTS:  None  Time spent: 25 minutes-Greater than 50% of this time was spent in counseling, explanation of diagnosis, planning of further management, and coordination of care.  MEDICATIONS: Scheduled Meds: Continuous Infusions: . meropenem (MERREM) IV 500 mg (02/07/18 2307)  . valproate sodium 250 mg (02/08/18 0001)   PRN Meds:.acetaminophen **OR** acetaminophen, LORazepam, morphine injection, ondansetron **OR** ondansetron (ZOFRAN) IV   PHYSICAL EXAM: Vital signs: Vitals:   02/07/18 1947 02/07/18 2109 02/08/18 0034 02/08/18 0814  BP: 111/65 118/64 (!) 98/49 (!) 115/57  Pulse: 92 89 69 (!) 59  Resp: 16 18 20 20   Temp:  97.8 F (36.6 C) (!) 97.5 F (36.4 C)   TempSrc:  Oral Oral   SpO2: 100% 99% 99% 100%  Weight:      Height:       Filed Weights   02/07/18 1455  Weight: 63.5 kg (140 lb)   Body mass index is 25.61 kg/m.  General appearance :Awake nonverbal-not in any distress. Eyes.Pink conjunctiva HEENT: Atraumatic and Normocephalic Neck: supple Resp:Good air entry bilaterally, no added sounds  CVS: S1 S2 regular.  GI: Bowel sounds present, Non tender and not distended with no gaurding, rigidity or rebound.No organomegaly Extremities: B/L Lower Ext shows no edema, both legs are warm to  touch Neurology: Difficult exam-but seems to move all 4 extremities. Musculoskeletal:No digital cyanosis Skin:No Rash, warm and dry Wounds:N/A  I have personally reviewed following labs and imaging studies  LABORATORY DATA: CBC: Recent Labs  Lab 02/07/18 1633 02/08/18 0700  WBC 10.6* 8.6  NEUTROABS 5.6 4.9  HGB 16.7* 14.3  HCT 54.7* 47.4*  MCV 94.5 95.6  PLT 117* 129*    Basic Metabolic Panel: Recent Labs  Lab 02/07/18 1633 02/08/18 0700  NA 156* 153*  K 4.4 4.0  CL 123* 122*  CO2 24 24  GLUCOSE 71 179*  BUN 70* 49*  CREATININE 2.03* 1.32*  CALCIUM 10.0 9.1    GFR: Estimated Creatinine Clearance: 22.8 mL/min (A) (by C-G formula based on SCr of 1.32 mg/dL (H)).  Liver Function Tests: Recent Labs  Lab 02/07/18 1633  AST 30  ALT 33  ALKPHOS 219*  BILITOT 0.4  PROT 7.6  ALBUMIN 2.7*   No results for input(s): LIPASE, AMYLASE in the last 168 hours. No results for input(s): AMMONIA in the last 168 hours.  Coagulation Profile: No results for input(s): INR, PROTIME in the last 168 hours.  Cardiac Enzymes: No results for input(s): CKTOTAL, CKMB, CKMBINDEX, TROPONINI in the last 168 hours.  BNP (last 3 results) No results for input(s): PROBNP in the last 8760 hours.  HbA1C: No results for input(s): HGBA1C in the last 72 hours.  CBG: Recent Labs  Lab 02/07/18 2113 02/07/18 2257 02/08/18 0358 02/08/18 0811  GLUCAP 43* 156* 128* 192*    Lipid Profile: No results for input(s): CHOL, HDL, LDLCALC, TRIG, CHOLHDL, LDLDIRECT in the last 72 hours.  Thyroid Function Tests: No results for input(s): TSH, T4TOTAL, FREET4, T3FREE, THYROIDAB in the last 72 hours.  Anemia Panel: No results for input(s): VITAMINB12, FOLATE, FERRITIN, TIBC, IRON, RETICCTPCT in the last 72 hours.  Urine analysis:    Component Value Date/Time   COLORURINE YELLOW 02/08/2018 0038   APPEARANCEUR CLOUDY (A) 02/08/2018 0038   LABSPEC 1.016 02/08/2018 0038   PHURINE 6.0  02/08/2018 0038   GLUCOSEU 150 (A) 02/08/2018 0038   HGBUR SMALL (A) 02/08/2018 0038   BILIRUBINUR NEGATIVE 02/08/2018 0038   KETONESUR 5 (A) 02/08/2018 0038   PROTEINUR 30 (A) 02/08/2018 0038   UROBILINOGEN 0.2 10/23/2014 0830   NITRITE NEGATIVE 02/08/2018 0038   LEUKOCYTESUR LARGE (A) 02/08/2018 0038    Sepsis Labs: Lactic Acid, Venous    Component Value Date/Time   LATICACIDVEN 1.11 12/05/2017 1559    MICROBIOLOGY: No results found for this or any previous visit (from the past 240 hour(s)).  RADIOLOGY STUDIES/RESULTS: Dg Chest 2 View  Result Date: 02/07/2018 CLINICAL DATA:  Weakness for 3 days, diabetes mellitus, pneumonia EXAM: CHEST - 2 VIEW COMPARISON:  12/05/2017, 06/18/2017 FINDINGS: Normal heart size, mediastinal contours, and pulmonary vascularity. Atherosclerotic calcifications aorta. Chronic bibasilar infiltrates slightly greater on LEFT question fibrosis. No definite acute infiltrate, pleural effusion or pneumothorax. New diffuse osseous demineralization. IMPRESSION: Chronic bibasilar interstitial changes likely representing pulmonary fibrosis. No definite acute infiltrate. Electronically Signed   By: Ulyses Southward M.D.   On: 02/07/2018 18:04     LOS: 1 day   Jeoffrey Massed, MD  [facility]  If 7PM-7AM, please contact night-coverage  Please page via [email or web address]-Password [email or web address]-click on MD name and type text message  02/08/2018, 9:19 AM

## 2018-02-09 DIAGNOSIS — N3000 Acute cystitis without hematuria: Secondary | ICD-10-CM

## 2018-02-09 LAB — GLUCOSE, CAPILLARY
GLUCOSE-CAPILLARY: 118 mg/dL — AB (ref 65–99)
Glucose-Capillary: 183 mg/dL — ABNORMAL HIGH (ref 65–99)
Glucose-Capillary: 144 mg/dL — ABNORMAL HIGH (ref 65–99)
Glucose-Capillary: 163 mg/dL — ABNORMAL HIGH (ref 65–99)
Glucose-Capillary: 268 mg/dL — ABNORMAL HIGH (ref 65–99)
Glucose-Capillary: 218 mg/dL — ABNORMAL HIGH (ref 65–99)

## 2018-02-09 LAB — BASIC METABOLIC PANEL
Anion gap: 8 (ref 5–15)
BUN: 35 mg/dL — ABNORMAL HIGH (ref 6–20)
CALCIUM: 9.1 mg/dL (ref 8.9–10.3)
CO2: 22 mmol/L (ref 22–32)
CREATININE: 1.01 mg/dL — AB (ref 0.44–1.00)
Chloride: 121 mmol/L — ABNORMAL HIGH (ref 101–111)
GFR calc Af Amer: 53 mL/min — ABNORMAL LOW (ref >60)
GFR calc non Af Amer: 46 mL/min — ABNORMAL LOW (ref >60)
GLUCOSE: 158 mg/dL — AB (ref 65–99)
Potassium: 4.3 mmol/L (ref 3.5–5.1)
Sodium: 151 mmol/L — ABNORMAL HIGH (ref 135–145)

## 2018-02-09 MED ORDER — PREMIER PROTEIN SHAKE
11.0000 [oz_av] | ORAL | Status: DC
  Administered 2018-02-09 – 2018-02-10 (×2): 11 [oz_av] via ORAL
  Filled 2018-02-09 (×2): qty 325

## 2018-02-09 MED ORDER — ENSURE ENLIVE PO LIQD: 237.0000 mL | ORAL | Status: DC

## 2018-02-09 MED ORDER — ADULT MULTIVITAMIN W/MINERALS CH
1.0000 | ORAL_TABLET | Freq: Every day | ORAL | Status: DC
  Administered 2018-02-09 – 2018-02-11 (×3): 1 via ORAL
  Filled 2018-02-09 (×3): qty 1

## 2018-02-09 NOTE — Progress Notes (Addendum)
Patient's daughter was concerned about patient's left hand. She felt as if it was swollen. I assessed patient's hand but didn't notice any swelling. Also assessed patient's IV site (Left AC). No issue noticed with the IV. Scant amount of blood residue due to bending of patient's arm. Informed patient's RN.

## 2018-02-09 NOTE — Progress Notes (Signed)
Initial Nutrition Assessment  DOCUMENTATION CODES:   Not applicable  INTERVENTION:  Premier Protein daily, each supplement provides 160 calories and 30 grams of protein - thickened to nectar thick  MVI  NUTRITION DIAGNOSIS:   Increased nutrient needs related to wound healing as evidenced by estimated needs.  GOAL:   Patient will meet greater than or equal to 90% of their needs  MONITOR:   PO intake, Supplement acceptance  REASON FOR ASSESSMENT:   Malnutrition Screening Tool    ASSESSMENT:   Patient has a PMH of advanced dementia, HTN, seizure disorder, IDDM2, presents with FTT, AKI, hyponatremia and possible UTI.  [facility] at baseline.  Spoke with patient and her daughter at bedside. Patient was unable to provide any history. Daughter reports a UBW of 140 pounds, denies any weight loss. Per chart patient exhibits a 9 pound/6% insignificant weight loss over 2 months.  PO intake usually consists of cream of wheat with apple sauce, a mashed banana and ensure for breakfast and baby food or pureed meals for lunch and dinner.  Patient will drink two ensure per day on a good day, but often only drinks one. Daughter states patient's intake was good until Sunday when she stopped eating and drinking completely. Per her report, this appears to be a cycle for her where she will eat and drink well for a while, and her intake slowly decreases until she is taking virtually no PO. Patient had eaten 100% of her meal during RD visit. It is unclear if she will maintain this level of intake. Of note, patient is a full feeding assist.  Labs reviewed:  Na 151 BUN/Cr 35/1.01  Medications reviewed and include:  Insulin   NUTRITION - FOCUSED PHYSICAL EXAM:    Most Recent Value  Orbital Region  No depletion  Upper Arm Region  No depletion  Thoracic and Lumbar Region  No depletion  Buccal Region  No depletion  Temple Region  No depletion   Clavicle Bone Region  No depletion  Clavicle and Acromion Bone Region  No depletion  Scapular Bone Region  No depletion  Dorsal Hand  No depletion  Patellar Region  No depletion  Anterior Thigh Region  No depletion  Posterior Calf Region  No depletion       Diet Order:   Diet Order           DIET - DYS 1 Room service appropriate? Yes; Fluid consistency: Nectar Thick  Diet effective now          EDUCATION NEEDS:   Not appropriate for education at this time  Skin:  Skin Assessment: Reviewed RN Assessment(MSAD to buttocks, Stage 2 non-pressure wound to R buttocks)  Last BM:  PTA  Height:   Ht Readings from Last 1 Encounters:  02/07/18 5\' 2"  (1.575 m)    Weight:   Wt Readings from Last 1 Encounters:  02/07/18 140 lb (63.5 kg)    Ideal Body Weight:  50 kg  BMI:  Body mass index is 25.61 kg/m.  Estimated Nutritional Needs:   Kcal:  [id number] calories (MSJ x1.4-1.6)  Protein:  76-83 grams (1.2-1.3g/kg)  Fluid:  UOP +55500mL    Dionne AnoWilliam M. Jamey Harman, MS, RD LDN Inpatient Clinical Dietitian Pager [phone]

## 2018-02-09 NOTE — Progress Notes (Signed)
PROGRESS NOTE        PATIENT DETAILS Name: Megan [name] Age: 84 y.o. Sex: female Date of Birth: [birth year] Admit Date: 02/07/2018 Admitting Physician Briscoe Deutscher, MD [id number], [name], MD  Brief Narrative: Patient is a 84 y.o. female with prior history advanced dementia, hypertension, seizure disorder, insulin-dependent DM-2-sent to the ED for failure to thrive syndrome and abnormal UA suggestive of UTI.  Found to have acute renal failure and hyponatremia.  See below for further details.  Subjective: Sleeping-I did not disturb her-daughter at bedside says she finally slept at 5 AM.  Per daughter-patient's oral intake has improved-8 epic junk of her dinner last night.  Assessment/Plan: Acute metabolic encephalopathy: Multifactorial etiology-secondary to UTI, hyponatremia, acute kidney injury in a background of advanced dementia.  Slowly improving with IV fluids, IV antibiotics and other supportive care.  Hopefully she will continue to improve and be able to be discharged home back with hospice care.  We have discussed with family that in the event she deteriorates-we had no other option apart from transitioning to comfort care-however this seems unlikely at this time.   Acute kidney injury: Intimately mediated-in the setting of poor oral intake-renal failure has essentially resolved with IV fluids.  Stop all IV fluids later today, recheck electrolytes tomorrow.   Hypernatremia: Due to poor oral intake-improved with IV fluids-stop all IV fluids later today-encourage oral intake-and recheck electrolytes tomorrow.   Complicated UTI: Has prior history of ESBL Klebsiella pneumoniae-continue meropenem.  Urine cultures positive for gram-negative rods.  Await final culture data.  Insulin-dependent DM-2: CBG stable with SSI, Lantus on hold.   History of seizure disorder: Continue Depakote  Advanced dementia: At risk for delirium-nonambulatory-and dependent on family for  all daily activities of living.  Evaluated by speech therapy restarted on a pured diet.    DVT Prophylaxis: Prophylactic Heparin  Code Status:  DNR  Family Communication: Daughter at bedside  Disposition Plan: Remain inpatient-hopefully back home with hospice care later this week  Antimicrobial agents: Anti-infectives (From admission, onward)   Start     Dose/Rate Route Frequency Ordered Stop   02/07/18 2200  meropenem (MERREM) 500 mg in sodium chloride 0.9 % 100 mL IVPB     500 mg 200 mL/hr over 30 Minutes Intravenous Every 12 hours 02/07/18 2015     02/07/18 1600  cefTRIAXone (ROCEPHIN) 1 g in sodium chloride 0.9 % 100 mL IVPB     1 g 200 mL/hr over 30 Minutes Intravenous  Once 02/07/18 1545 02/07/18 1720      Procedures: None  CONSULTS:  None  Time spent: 25 minutes-Greater than 50% of this time was spent in counseling, explanation of diagnosis, planning of further management, and coordination of care.  MEDICATIONS: Scheduled Meds: . heparin injection (subcutaneous)  5,000 Units Subcutaneous Q8H  . insulin aspart  0-9 Units Subcutaneous TID WC   Continuous Infusions: . dextrose 5 % and 0.45% NaCl 1,000 mL infusion 50 mL/hr at 02/08/18 1016  . meropenem (MERREM) IV 500 mg (02/08/18 2208)  . valproate sodium 250 mg (02/09/18 1038)   PRN Meds:.acetaminophen **OR** acetaminophen, LORazepam, morphine injection, ondansetron **OR** ondansetron (ZOFRAN) IV, RESOURCE THICKENUP CLEAR   PHYSICAL EXAM: Vital signs: Vitals:   02/08/18 1958 02/09/18 0021 02/09/18 0410 02/09/18 0818  BP: (!) 102/42 (!) 124/52 (!) 119/59 (!) 148/60  Pulse: (!) 109 92 72 72  Resp: 18  18 16   Temp: (!) 97.4 F (36.3 C) 97.6 F (36.4 C) 98.5 F (36.9 C) 97.9 F (36.6 C)  TempSrc: Oral Oral Oral Axillary  SpO2: 100% 100% 100% 100%  Weight:      Height:       Filed Weights   02/07/18 1455  Weight: 63.5 kg (140 lb)   Body mass index is 25.61 kg/m.   General appearance: Sleeping  comfortably-not in any distress.  I have personally reviewed following labs and imaging studies  LABORATORY DATA: CBC: Recent Labs  Lab 02/07/18 1633 02/08/18 0700  WBC 10.6* 8.6  NEUTROABS 5.6 4.9  HGB 16.7* 14.3  HCT 54.7* 47.4*  MCV 94.5 95.6  PLT 117* 129*    Basic Metabolic Panel: Recent Labs  Lab 02/07/18 1633 02/08/18 0700 02/09/18 0450  NA 156* 153* 151*  K 4.4 4.0 4.3  CL 123* 122* 121*  CO2 24 24 22   GLUCOSE 71 179* 158*  BUN 70* 49* 35*  CREATININE 2.03* 1.32* 1.01*  CALCIUM 10.0 9.1 9.1    GFR: Estimated Creatinine Clearance: 29.8 mL/min (A) (by C-G formula based on SCr of 1.01 mg/dL (H)).  Liver Function Tests: Recent Labs  Lab 02/07/18 1633  AST 30  ALT 33  ALKPHOS 219*  BILITOT 0.4  PROT 7.6  ALBUMIN 2.7*   No results for input(s): LIPASE, AMYLASE in the last 168 hours. No results for input(s): AMMONIA in the last 168 hours.  Coagulation Profile: No results for input(s): INR, PROTIME in the last 168 hours.  Cardiac Enzymes: No results for input(s): CKTOTAL, CKMB, CKMBINDEX, TROPONINI in the last 168 hours.  BNP (last 3 results) No results for input(s): PROBNP in the last 8760 hours.  HbA1C: No results for input(s): HGBA1C in the last 72 hours.  CBG: Recent Labs  Lab 02/08/18 1603 02/08/18 2124 02/09/18 0100 02/09/18 0403 02/09/18 0814  GLUCAP 141* 240* 183* 144* 118*    Lipid Profile: No results for input(s): CHOL, HDL, LDLCALC, TRIG, CHOLHDL, LDLDIRECT in the last 72 hours.  Thyroid Function Tests: No results for input(s): TSH, T4TOTAL, FREET4, T3FREE, THYROIDAB in the last 72 hours.  Anemia Panel: No results for input(s): VITAMINB12, FOLATE, FERRITIN, TIBC, IRON, RETICCTPCT in the last 72 hours.  Urine analysis:    Component Value Date/Time   COLORURINE YELLOW 02/08/2018 0038   APPEARANCEUR CLOUDY (A) 02/08/2018 0038   LABSPEC 1.016 02/08/2018 0038   PHURINE 6.0 02/08/2018 0038   GLUCOSEU 150 (A) 02/08/2018  0038   HGBUR SMALL (A) 02/08/2018 0038   BILIRUBINUR NEGATIVE 02/08/2018 0038   KETONESUR 5 (A) 02/08/2018 0038   PROTEINUR 30 (A) 02/08/2018 0038   UROBILINOGEN 0.2 10/23/2014 0830   NITRITE NEGATIVE 02/08/2018 0038   LEUKOCYTESUR LARGE (A) 02/08/2018 0038    Sepsis Labs: Lactic Acid, Venous    Component Value Date/Time   LATICACIDVEN 1.11 12/05/2017 1559    MICROBIOLOGY: Recent Results (from the past 240 hour(s))  Urine Culture     Status: Abnormal (Preliminary result)   Collection Time: 02/08/18 12:38 AM  Result Value Ref Range Status   Specimen Description URINE, RANDOM  Final   Special Requests   Final    NONE Performed at [facility], [address] [place], [place] [address]    Culture >=100,000 COLONIES/mL GRAM NEGATIVE RODS (A)  Final   Report Status PENDING  Incomplete    RADIOLOGY STUDIES/RESULTS: Dg Chest 2 View  Result Date: 02/07/2018 CLINICAL DATA:  Weakness  for 3 days, diabetes mellitus, pneumonia EXAM: CHEST - 2 VIEW COMPARISON:  12/05/2017, 06/18/2017 FINDINGS: Normal heart size, mediastinal contours, and pulmonary vascularity. Atherosclerotic calcifications aorta. Chronic bibasilar infiltrates slightly greater on LEFT question fibrosis. No definite acute infiltrate, pleural effusion or pneumothorax. New diffuse osseous demineralization. IMPRESSION: Chronic bibasilar interstitial changes likely representing pulmonary fibrosis. No definite acute infiltrate. Electronically Signed   By: Ulyses Southward M.D.   On: 02/07/2018 18:04     LOS: 2 days   Jeoffrey Massed, MD  [facility]  If 7PM-7AM, please contact night-coverage  Please page via [email or web address]-click on MD name and type text message  02/09/2018, 11:10 AM

## 2018-02-09 NOTE — Consult Note (Signed)
[facility]:   Pt is current home care pt with our services. We have had her a very short period of time being admitted to hospice care on 01/27/18. She was a care connection pt prior to this with our home based palliative care program provided by [facility].  The daughter feels that the pt is doing much better now that she has IVF etc. We dicussed this may be a short term fix for a long term problem and that we need to have a plan if this happens after she gets home and continues to not eat or drink well that we will be back in the same situation of dehydration. She verbalizes understanding. She feels if this is a routine event then she would like for her mother to go [facility] rather than come back to hospital at that time.  We will continue to follow upon discharge to home with daughter. Norm ParcelCheri Kennedy RN [phone]

## 2018-02-10 LAB — BASIC METABOLIC PANEL
Anion gap: 7 (ref 5–15)
BUN: 31 mg/dL — AB (ref 6–20)
CHLORIDE: 119 mmol/L — AB (ref 101–111)
CO2: 24 mmol/L (ref 22–32)
CREATININE: 1 mg/dL (ref 0.44–1.00)
Calcium: 9.1 mg/dL (ref 8.9–10.3)
GFR calc Af Amer: 54 mL/min — ABNORMAL LOW (ref >60)
GFR, EST NON AFRICAN AMERICAN: 47 mL/min — AB (ref >60)
GLUCOSE: 308 mg/dL — AB (ref 65–99)
Potassium: 4.9 mmol/L (ref 3.5–5.1)
SODIUM: 150 mmol/L — AB (ref 135–145)

## 2018-02-10 LAB — GLUCOSE, CAPILLARY
Glucose-Capillary: 234 mg/dL — ABNORMAL HIGH (ref 65–99)
Glucose-Capillary: 221 mg/dL — ABNORMAL HIGH (ref 65–99)
Glucose-Capillary: 312 mg/dL — ABNORMAL HIGH (ref 65–99)
Glucose-Capillary: 299 mg/dL — ABNORMAL HIGH (ref 65–99)

## 2018-02-10 LAB — URINE CULTURE: Culture: >=100000 — AB

## 2018-02-10 MED ORDER — FAMOTIDINE 20 MG PO TABS
20.0000 mg | ORAL_TABLET | Freq: Every day | ORAL | Status: DC
  Administered 2018-02-10 – 2018-02-11 (×2): 20 mg via ORAL
  Filled 2018-02-10 (×2): qty 1

## 2018-02-10 MED ORDER — MEMANTINE HCL ER 14 MG PO CP24
14.0000 mg | ORAL_CAPSULE | Freq: Every day | ORAL | Status: DC
  Administered 2018-02-10: 14 mg via ORAL
  Filled 2018-02-10 (×2): qty 1

## 2018-02-10 MED ORDER — CLOPIDOGREL BISULFATE 75 MG PO TABS
75.0000 mg | ORAL_TABLET | Freq: Every day | ORAL | Status: DC
  Administered 2018-02-10 – 2018-02-11 (×2): 75 mg via ORAL
  Filled 2018-02-10 (×2): qty 1

## 2018-02-10 MED ORDER — BACID PO TABS
2.0000 | ORAL_TABLET | Freq: Three times a day (TID) | ORAL | Status: DC
  Administered 2018-02-10 – 2018-02-11 (×4): 2 via ORAL
  Filled 2018-02-10 (×6): qty 2

## 2018-02-10 MED ORDER — ASPIRIN EC 81 MG PO TBEC
81.0000 mg | DELAYED_RELEASE_TABLET | Freq: Every day | ORAL | Status: DC
  Administered 2018-02-10 – 2018-02-11 (×2): 81 mg via ORAL
  Filled 2018-02-10 (×2): qty 1

## 2018-02-10 MED ORDER — DEXTROSE 5 % IV SOLN
INTRAVENOUS | Status: AC
  Administered 2018-02-10: 12:00:00 via INTRAVENOUS

## 2018-02-10 MED ORDER — FERROUS SULFATE 325 (65 FE) MG PO TABS
325.0000 mg | ORAL_TABLET | Freq: Every day | ORAL | Status: DC
  Administered 2018-02-10 – 2018-02-11 (×2): 325 mg via ORAL
  Filled 2018-02-10 (×2): qty 1

## 2018-02-10 MED ORDER — DIVALPROEX SODIUM 125 MG PO CSDR
250.0000 mg | DELAYED_RELEASE_CAPSULE | Freq: Two times a day (BID) | ORAL | Status: DC
  Administered 2018-02-10 – 2018-02-11 (×3): 250 mg via ORAL
  Filled 2018-02-10 (×3): qty 2

## 2018-02-10 MED ORDER — ROSUVASTATIN CALCIUM 5 MG PO TABS
5.0000 mg | ORAL_TABLET | ORAL | Status: DC
  Administered 2018-02-10: 5 mg via ORAL
  Filled 2018-02-10: qty 1

## 2018-02-10 NOTE — Progress Notes (Signed)
Pharmacy Antibiotic Note  Megan [name] is a 84 y.o. female admitted on 02/07/2018 with reduce PO intake and altered mental status.   With UTI, AKI has resolved- SCr back to 1.   Plan: Meropenem 500 mg IV q12h  Per MD note today, to continue IV antibiotics- recommend limiting therapy to 5 days given history of seizures (tomorrow is D#5)   Height: 5\' 2"  (157.5 cm) Weight: 140 lb (63.5 kg) IBW/kg (Calculated) : 50.1  Temp (24hrs), Avg:98 F (36.7 C), Min:97.7 F (36.5 C), Max:98.3 F (36.8 C)  Recent Labs  Lab 02/07/18 1633 02/08/18 0700 02/09/18 0450 02/10/18 0341  WBC 10.6* 8.6  --   --   CREATININE 2.03* 1.32* 1.01* 1.00    Estimated Creatinine Clearance: 30.1 mL/min (by C-G formula based on SCr of 1 mg/dL).    Allergies  Allergen Reactions  . Lipitor [Atorvastatin] Other (See Comments)    Myalgias     Antimicrobials this admission: 6/18 ceftriaxone x1 6/18 meropenem >  Microbiology results: 6/18 urine cx: enterobacter- Sens to ciprofloxacin, imipenem, and Zosyn only  Facundo Allemand D. Traver Meckes, PharmD, BCPS Clinical Pharmacist [phone] Please check AMION for all [facility] numbers 02/10/2018 10:45 AM

## 2018-02-10 NOTE — Progress Notes (Signed)
PROGRESS NOTE        PATIENT DETAILS Name: Megan [name] Age: 84 y.o. Sex: female Date of Birth: 1922/05/04 Admit Date: 02/07/2018 Admitting Physician Briscoe Deutscher, MD RUE:[id number], [name], MD  Brief Narrative: Patient is a 84 y.o. female with prior history advanced dementia, hypertension, seizure disorder, insulin-dependent DM-2-sent to the ED for failure to thrive syndrome and abnormal UA suggestive of UTI.  Found to have acute renal failure and hyponatremia.  See below for further details.  Subjective: Awake this morning-Per daughter minimally verbal at baseline.  Per daughter-starting to eat more over the past few days.  Assessment/Plan: Acute metabolic encephalopathy: Improved, multifactorial etiology-secondary to UTI, hyponatremia and acute kidney injury in a background of advanced dementia.  Continue gentle hydration for 12 hours today, continue IV antimicrobial therapy.  Suspect discharge back home with hospice follow-up, since she has improved-I think she does not require transfer to residential hospice as of now.    Acute kidney injury: Resolved, hemodynamically  mediated-in the setting of poor oral intake.recheck electrolytes tomorrow.   Hypernatremia: Slowly improving, clearly secondary to poor oral intake.  Resume gentle hydration for a few hours today-encourage oral intake, recheck electrolytes tomorrow.  Complicated UTI: Has prior history of ESBL Klebsiella pneumoniae-urine culture positive for Enterobacter-awaiting sensitivities.  Continue meropenem for now.   Insulin-dependent DM-2: CBG stable with SSI, Lantus on hold.  Allow permissive hyperglycemia in a setting of advanced dementia/poor oral intake.  History of seizure disorder: Continue Depakote  Advanced dementia: At risk for delirium-nonambulatory-and dependent on family for all daily activities of living.  Evaluated by speech therapy restarted on a pured diet.    DVT  Prophylaxis: Prophylactic Heparin  Code Status:  DNR  Family Communication: Daughter at bedside  Disposition Plan: Remain inpatient-hopefully back home with hospice care tomorrow  Antimicrobial agents: Anti-infectives (From admission, onward)   Start     Dose/Rate Route Frequency Ordered Stop   02/07/18 2200  meropenem (MERREM) 500 mg in sodium chloride 0.9 % 100 mL IVPB     500 mg 200 mL/hr over 30 Minutes Intravenous Every 12 hours 02/07/18 2015     02/07/18 1600  cefTRIAXone (ROCEPHIN) 1 g in sodium chloride 0.9 % 100 mL IVPB     1 g 200 mL/hr over 30 Minutes Intravenous  Once 02/07/18 1545 02/07/18 1720      Procedures: None  CONSULTS:  None  Time spent: 25 minutes-Greater than 50% of this time was spent in counseling, explanation of diagnosis, planning of further management, and coordination of care.  MEDICATIONS: Scheduled Meds: . aspirin EC  81 mg Oral Daily  . [START ON 02/11/2018] clopidogrel  75 mg Oral Q breakfast  . divalproex  250 mg Oral BID  . famotidine  20 mg Oral Daily  . [START ON 02/11/2018] ferrous sulfate  325 mg Oral Q breakfast  . heparin injection (subcutaneous)  5,000 Units Subcutaneous Q8H  . insulin aspart  0-9 Units Subcutaneous TID WC  . lactobacillus acidophilus  2 tablet Oral TID  . memantine  14 mg Oral QHS  . multivitamin with minerals  1 tablet Oral Daily  . protein supplement shake  11 oz Oral Q24H  . rosuvastatin  5 mg Oral Q M,W,F   Continuous Infusions: . dextrose    . meropenem (MERREM) IV 500 mg (02/09/18 2132)   PRN Meds:.acetaminophen **OR**  acetaminophen, LORazepam, morphine injection, ondansetron **OR** ondansetron (ZOFRAN) IV, RESOURCE THICKENUP CLEAR   PHYSICAL EXAM: Vital signs: Vitals:   02/09/18 1954 02/09/18 2342 02/10/18 0346 02/10/18 0735  BP: 123/63 120/60 (!) 114/53 (!) 93/38  Pulse: 93 (!) 101 96 99  Resp: 18 20 18 16   Temp: 97.7 F (36.5 C) 98 F (36.7 C) 98.3 F (36.8 C) 98.2 F (36.8 C)   TempSrc: Axillary Axillary Axillary Axillary  SpO2: 93% 94% 97% 94%  Weight:      Height:       Filed Weights   02/07/18 1455  Weight: 63.5 kg (140 lb)   Body mass index is 25.61 kg/m.   General appearance:Awake-nonverbal-tracks my movement.  Not in distress. Eyes:no scleral icterus. HEENT: Atraumatic and Normocephalic Neck: supple Resp:Good air entry bilaterally,no rales or rhonchi CVS: S1 S2 regular, no murmurs.  GI: Bowel sounds present, Non tender and not distended with no gaurding, rigidity or rebound. Extremities: B/L Lower Ext shows no edema, both legs are warm to touch Neurology: Difficult exam-but appears nonfocal. Musculoskeletal:No digital cyanosis Skin:No Rash, warm and dry Wounds:N/A  I have personally reviewed following labs and imaging studies  LABORATORY DATA: CBC: Recent Labs  Lab 02/07/18 1633 02/08/18 0700  WBC 10.6* 8.6  NEUTROABS 5.6 4.9  HGB 16.7* 14.3  HCT 54.7* 47.4*  MCV 94.5 95.6  PLT 117* 129*    Basic Metabolic Panel: Recent Labs  Lab 02/07/18 1633 02/08/18 0700 02/09/18 0450 02/10/18 0341  NA 156* 153* 151* 150*  K 4.4 4.0 4.3 4.9  CL 123* 122* 121* 119*  CO2 24 24 22 24   GLUCOSE 71 179* 158* 308*  BUN 70* 49* 35* 31*  CREATININE 2.03* 1.32* 1.01* 1.00  CALCIUM 10.0 9.1 9.1 9.1    GFR: Estimated Creatinine Clearance: 30.1 mL/min (by C-G formula based on SCr of 1 mg/dL).  Liver Function Tests: Recent Labs  Lab 02/07/18 1633  AST 30  ALT 33  ALKPHOS 219*  BILITOT 0.4  PROT 7.6  ALBUMIN 2.7*   No results for input(s): LIPASE, AMYLASE in the last 168 hours. No results for input(s): AMMONIA in the last 168 hours.  Coagulation Profile: No results for input(s): INR, PROTIME in the last 168 hours.  Cardiac Enzymes: No results for input(s): CKTOTAL, CKMB, CKMBINDEX, TROPONINI in the last 168 hours.  BNP (last 3 results) No results for input(s): PROBNP in the last 8760 hours.  HbA1C: No results for input(s):  HGBA1C in the last 72 hours.  CBG: Recent Labs  Lab 02/09/18 0814 02/09/18 1207 02/09/18 1600 02/09/18 2109 02/10/18 0601  GLUCAP 118* 163* 268* 218* 234*    Lipid Profile: No results for input(s): CHOL, HDL, LDLCALC, TRIG, CHOLHDL, LDLDIRECT in the last 72 hours.  Thyroid Function Tests: No results for input(s): TSH, T4TOTAL, FREET4, T3FREE, THYROIDAB in the last 72 hours.  Anemia Panel: No results for input(s): VITAMINB12, FOLATE, FERRITIN, TIBC, IRON, RETICCTPCT in the last 72 hours.  Urine analysis:    Component Value Date/Time   COLORURINE YELLOW 02/08/2018 0038   APPEARANCEUR CLOUDY (A) 02/08/2018 0038   LABSPEC 1.016 02/08/2018 0038   PHURINE 6.0 02/08/2018 0038   GLUCOSEU 150 (A) 02/08/2018 0038   HGBUR SMALL (A) 02/08/2018 0038   BILIRUBINUR NEGATIVE 02/08/2018 0038   KETONESUR 5 (A) 02/08/2018 0038   PROTEINUR 30 (A) 02/08/2018 0038   UROBILINOGEN 0.2 10/23/2014 0830   NITRITE NEGATIVE 02/08/2018 0038   LEUKOCYTESUR LARGE (A) 02/08/2018 0038    Sepsis Labs:  Lactic Acid, Venous    Component Value Date/Time   LATICACIDVEN 1.11 12/05/2017 1559    MICROBIOLOGY: Recent Results (from the past 240 hour(s))  Urine Culture     Status: Abnormal   Collection Time: 02/08/18 12:38 AM  Result Value Ref Range Status   Specimen Description URINE, RANDOM  Final   Special Requests   Final    NONE Performed at [facility], [address], [place], [place] [address]    Culture >=100,000 COLONIES/mL ENTEROBACTER AEROGENES (A)  Final   Report Status 02/10/2018 FINAL  Final   Organism ID, Bacteria ENTEROBACTER AEROGENES (A)  Final      Susceptibility   Enterobacter aerogenes - MIC*    CEFAZOLIN >=64 RESISTANT Resistant     CEFTRIAXONE >=64 RESISTANT Resistant     CIPROFLOXACIN 1 SENSITIVE Sensitive     GENTAMICIN <=1 SENSITIVE Sensitive     IMIPENEM 1 SENSITIVE Sensitive     NITROFURANTOIN 128 RESISTANT Resistant     TRIMETH/SULFA >=320 RESISTANT Resistant      PIP/TAZO <=4 SENSITIVE Sensitive     * >=100,000 COLONIES/mL ENTEROBACTER AEROGENES    RADIOLOGY STUDIES/RESULTS: Dg Chest 2 View  Result Date: 02/07/2018 CLINICAL DATA:  Weakness for 3 days, diabetes mellitus, pneumonia EXAM: CHEST - 2 VIEW COMPARISON:  12/05/2017, 06/18/2017 FINDINGS: Normal heart size, mediastinal contours, and pulmonary vascularity. Atherosclerotic calcifications aorta. Chronic bibasilar infiltrates slightly greater on LEFT question fibrosis. No definite acute infiltrate, pleural effusion or pneumothorax. New diffuse osseous demineralization. IMPRESSION: Chronic bibasilar interstitial changes likely representing pulmonary fibrosis. No definite acute infiltrate. Electronically Signed   By: Ulyses Southward M.D.   On: 02/07/2018 18:04     LOS: 3 days   Jeoffrey Massed, MD  [facility]  If 7PM-7AM, please contact night-coverage  Please page via [email or web address]-click on MD name and type text message  02/10/2018, 10:23 AM

## 2018-02-10 NOTE — Care Management Note (Signed)
Case Management Note  Patient Details  Name: Megan PersonRuby Laurent MRN: [id number] Date of Birth: 1922/02/02  Subjective/Objective:   Pt admitted with acute metabolic encephalopathy. She is from home with family and [facility].                  Action/Plan: Plan is for patient to return home with family and hospice when medically ready. CM following.  Expected Discharge Date:  02/10/18               Expected Discharge Plan:  Home w Hospice Care  In-House Referral:     Discharge planning Services  CM Consult  Post Acute Care Choice:    Choice offered to:     DME Arranged:    DME Agency:     HH Arranged:    HH Agency:     Status of Service:  In process, will continue to follow  If discussed at Long Length of Stay Meetings, dates discussed:    Additional Comments:  Kermit BaloKelli F Haruko Mersch, RN 02/10/2018, 10:28 AM

## 2018-02-10 NOTE — Consult Note (Signed)
[facility]:  Pt daughter Wilnette Kaleshelma at bedside. She feels that her mom looks better and continues to want to take her home with hospice care to resume.  Thank you for caring for our pt and family. Norm Parcelheri Kennedy RN [phone]

## 2018-02-11 LAB — BASIC METABOLIC PANEL
ANION GAP: 6 (ref 5–15)
BUN: 20 mg/dL (ref 6–20)
CO2: 25 mmol/L (ref 22–32)
Calcium: 9.4 mg/dL (ref 8.9–10.3)
Chloride: 116 mmol/L — ABNORMAL HIGH (ref 101–111)
Creatinine, Ser: 0.79 mg/dL (ref 0.44–1.00)
Glucose, Bld: 221 mg/dL — ABNORMAL HIGH (ref 65–99)
POTASSIUM: 4.3 mmol/L (ref 3.5–5.1)
SODIUM: 147 mmol/L — AB (ref 135–145)

## 2018-02-11 LAB — GLUCOSE, CAPILLARY
GLUCOSE-CAPILLARY: 214 mg/dL — AB (ref 65–99)
GLUCOSE-CAPILLARY: 208 mg/dL — AB (ref 65–99)

## 2018-02-11 MED ORDER — CIPROFLOXACIN HCL 500 MG PO TABS
500.0000 mg | ORAL_TABLET | Freq: Two times a day (BID) | ORAL | Status: DC
  Administered 2018-02-11: 500 mg via ORAL
  Filled 2018-02-11: qty 1

## 2018-02-11 MED ORDER — CIPROFLOXACIN HCL 500 MG PO TABS: 500.0000 mg | ORAL_TABLET | Freq: Two times a day (BID) | ORAL | 0 refills | Status: AC

## 2018-02-11 NOTE — Care Management (Addendum)
1051 02-11-18 CM did call [facility] [phone]) to make them aware that pt will transition home 02-12-28. Roxanne RN Liaison aware of patient to transition home- [facility] will follow up on Sunday. CM did call [facility] for transport @ 4:00 pm for pick up. Information placed on Shadow Chart. No further needs from CM at this time. Gala LewandowskyGraves-Bigelow, Ceniyah Thorp Kaye, RN,BSN Case Manager [phone]

## 2018-02-11 NOTE — Progress Notes (Signed)
Patient had small amount of reflux, consistency was thick and purpleish tint. RN suctioned patient's mouth and provided mouth care. MD notified. Will continue to monitor.

## 2018-02-11 NOTE — Discharge Summary (Addendum)
PATIENT DETAILS Name: Megan [name] Age: 84 y.o. Sex: female Date of Birth: 03/01/1922 MRN: [id number]. Admitting Physician: Briscoe Deutscher, MD WUJ:[id number], [name], MD  Admit Date: 02/07/2018 Discharge date: 02/11/2018  Recommendations for Outpatient Follow-up:  1. Follow up with PCP in 1-2 weeks 2. Please obtain BMP/CBC in one week 3. Continue follow-up with hospice on discharge.   Admitted From:  Home with hospice  Disposition: Home with hospice services   Home Health: No  Equipment/Devices: None  Discharge Condition: Stable  CODE STATUS: FULL CODE  Diet recommendation:   Dysphagia 1 (Puree);Thin liquid;Nectar-thick liquid   Liquid Administration via: Cup;Straw Medication Administration: Crushed with puree Supervision: Full supervision/cueing for compensatory strategies Compensations: Slow rate;Small sips/bites Postural Changes: Seated upright at 90 degrees;Remain upright for at least 30 minutes after po intake       Brief Summary: See H&P, Labs, Consult and Test reports for all details in brief, Patient is a 84 y.o. female with prior history advanced dementia, hypertension, seizure disorder, insulin-dependent DM-2-sent to the ED for failure to thrive syndrome and abnormal UA suggestive of UTI.  Found to have acute renal failure and hypernatremia.  See below for further details.  Brief Hospital Course: Acute metabolic encephalopathy: Improved, multifactorial etiology-secondary to UTI, hyponatremia and acute kidney injury in a background of advanced dementia.   Acute kidney injury: Resolved, hemodynamically  mediated-in the setting of poor oral intake.recheck electrolytes periodically  Hypernatremia:  Much improved-treated with gentle hypotonic saline.  Sodium down to 147-have encouraged oral intake.  She is clinically much improved-much more alert-and her appetite is also improving.  Recheck electrolytes in 1 week at PCPs office.    Complicated UTI: Has prior  history of ESBL Klebsiella pneumoniae-urine culture positive for Enterobacter-sensitive to only Cipro (no other oral alternative)-requires 3 more doses of ciprofloxacin to complete a 7-day course.  Treated with meropenem during this hospital stay.  Insulin-dependent DM-2: CBG stable with SSI, Lantus on hold resume on discharge.  Further optimization deferred to PCP.  History of seizure disorder: Continue Depakote  Advanced dementia: At risk for delirium-nonambulatory-and dependent on family for all daily activities of living.  Minimally verbal at baseline as well. Evaluated by speech therapy restarted on a pured diet.    Advanced directives: DNR in place.  Family aware of poor overall prognosis-potential for further deterioration in the setting of advanced dementia leading to AKI and other electrolyte abnormalities.  Fortunately she has improved with supportive care-she will be discharged back home with hospice services.  Procedures/Studies: None  Discharge Diagnoses:  Principal Problem:   Acute metabolic encephalopathy Active Problems:   Diabetes mellitus type II, uncontrolled (HCC)   HTN (hypertension)   History of partial seizures   History of CVA w/residual right side deficits   Dementia with Lewy bodies   AKI (acute kidney injury) (HCC)   UTI (urinary tract infection)   Failure to thrive in adult   Hypernatremia   History of ESBL Klebsiella pneumoniae infection   Acute encephalopathy   Discharge Instructions:  Activity:  As tolerated with Full fall precautions use walker/cane & assistance as needed  Discharge Instructions    Diet - low sodium heart healthy   Complete by:  As directed    Dysphagia 1 (Puree);Thin liquid;Nectar-thick liquid  Liquid Administration via: Cup;Straw Medication Administration: Crushed with puree Supervision: Full supervision/cueing for compensatory strategies Compensations: Slow rate;Small sips/bites Postural Changes: Seated upright at 90  degrees;Remain upright for at least 30 minutes after po intake   Discharge  instructions   Complete by:  As directed    Follow with Primary MD  [name], MD in 1 week  Please get a complete blood count and chemistry panel checked by your Primary MD at your next visit, and again as instructed by your Primary MD.  Get Medicines reviewed and adjusted: Please take all your medications with you for your next visit with your Primary MD  Laboratory/radiological data: Please request your Primary MD to go over all hospital tests and procedure/radiological results at the follow up, please ask your Primary MD to get all Hospital records sent to his/her office.  In some cases, they will be blood work, cultures and biopsy results pending at the time of your discharge. Please request that your primary care M.D. follows up on these results.  Also Note the following: If you experience worsening of your admission symptoms, develop shortness of breath, life threatening emergency, suicidal or homicidal thoughts you must seek medical attention immediately by calling 911 or calling your MD immediately  if symptoms less severe.  You must read complete instructions/literature along with all the possible adverse reactions/side effects for all the Medicines you take and that have been prescribed to you. Take any new Medicines after you have completely understood and accpet all the possible adverse reactions/side effects.   Do not drive when taking Pain medications or sleeping medications (Benzodaizepines)  Do not take more than prescribed Pain, Sleep and Anxiety Medications. It is not advisable to combine anxiety,sleep and pain medications without talking with your primary care practitioner  Special Instructions: If you have smoked or chewed Tobacco  in the last 2 yrs please stop smoking, stop any regular Alcohol  and or any Recreational drug use.  Wear Seat belts while driving.  Please note: You were cared  for by a hospitalist during your hospital stay. Once you are discharged, your primary care physician will handle any further medical issues. Please note that NO REFILLS for any discharge medications will be authorized once you are discharged, as it is imperative that you return to your primary care physician (or establish a relationship with a primary care physician if you do not have one) for your post hospital discharge needs so that they can reassess your need for medications and monitor your lab values.   Increase activity slowly   Complete by:  As directed      Allergies as of 02/11/2018      Reactions   Lipitor [atorvastatin] Other (See Comments)   Myalgias       Medication List    TAKE these medications   acetaminophen 325 MG tablet Commonly known as:  TYLENOL Take 2 tablets (650 mg total) by mouth every 6 (six) hours as needed for mild pain (or Fever >/= 101).   aspirin EC 81 MG tablet Take 1 tablet (81 mg total) by mouth daily.   bisacodyl 10 MG suppository Commonly known as:  DULCOLAX Place 10 mg rectally every three (3) days as needed for constipation.   ciprofloxacin 500 MG tablet Commonly known as:  CIPRO Take 1 tablet (500 mg total) by mouth 2 (two) times daily.   clopidogrel 75 MG tablet Commonly known as:  PLAVIX Take 1 tablet (75 mg total) by mouth daily with breakfast.   divalproex 125 MG capsule Commonly known as:  DEPAKOTE SPRINKLE Take 250 mg by mouth 2 (two) times daily.   famotidine 20 MG tablet Commonly known as:  PEPCID Take 1 tablet (20 mg total) by  mouth daily.   feeding supplement (PRO-STAT SUGAR FREE 64) Liqd Take 30 mLs by mouth 4 (four) times daily -  with meals and at bedtime.   ferrous sulfate 325 (65 FE) MG tablet Take 325 mg by mouth daily with breakfast.   lactobacillus acidophilus Tabs tablet Take 2 tablets by mouth 3 (three) times daily.   loperamide 2 MG tablet Commonly known as:  IMODIUM A-D Take 1 tablet (2 mg total) by mouth  4 (four) times daily as needed for diarrhea or loose stools.   Melatonin 3 MG Tabs Take 1 tablet (3 mg total) by mouth at bedtime as needed (sleep).   multivitamin with minerals Tabs tablet Take 1 tablet by mouth daily.   NAMENDA XR 14 MG Cp24 24 hr capsule Generic drug:  memantine Take 14 mg by mouth at bedtime.   nystatin powder Generic drug:  nystatin Apply 1 application topically 2 (two) times daily as needed for rash.   ondansetron 4 MG disintegrating tablet Commonly known as:  ZOFRAN ODT Take 1 tablet (4 mg total) by mouth every 8 (eight) hours as needed. What changed:  reasons to take this   oxyCODONE 5 MG immediate release tablet Commonly known as:  Oxy IR/ROXICODONE Take 2.5 mg by mouth every 4 (four) hours as needed for pain.   protein supplement shake Liqd Commonly known as:  PREMIER PROTEIN Take 59.1 mLs (2 oz total) by mouth 4 (four) times daily -  with meals and at bedtime. What changed:  when to take this   rosuvastatin 5 MG tablet Commonly known as:  CRESTOR Take 5 mg by mouth every Monday, Wednesday, and Friday. At bedtime   TOUJEO MAX SOLOSTAR 300 UNIT/ML Sopn Generic drug:  Insulin Glargine Inject 5-9 Units into the skin at bedtime.   vitamin C 500 MG tablet Commonly known as:  ASCORBIC ACID Take 500 mg by mouth daily.   zinc oxide 11.3 % Crea cream Commonly known as:  BALMEX Apply 1 application topically daily as needed (for rash).      Follow-up Information    [name], John, MD. Schedule an appointment as soon as possible for a visit in 1 week(s).   Specialty:  Internal Medicine Contact information: [address] [place] [address] [phone]          Allergies  Allergen Reactions  . Lipitor [Atorvastatin] Other (See Comments)    Myalgias     Consultations:   None   Other Procedures/Studies: Dg Chest 2 View  Result Date: 02/07/2018 CLINICAL DATA:  Weakness for 3 days, diabetes mellitus, pneumonia  EXAM: CHEST - 2 VIEW COMPARISON:  12/05/2017, 06/18/2017 FINDINGS: Normal heart size, mediastinal contours, and pulmonary vascularity. Atherosclerotic calcifications aorta. Chronic bibasilar infiltrates slightly greater on LEFT question fibrosis. No definite acute infiltrate, pleural effusion or pneumothorax. New diffuse osseous demineralization. IMPRESSION: Chronic bibasilar interstitial changes likely representing pulmonary fibrosis. No definite acute infiltrate. Electronically Signed   By: Ulyses SouthwardMark  Boles M.D.   On: 02/07/2018 18:04      TODAY-DAY OF DISCHARGE:  Subjective:   Megan [name] today has remains pleasantly confused.  Objective:   Blood pressure (!) 111/57, pulse 87, temperature 97.7 F (36.5 C), temperature source Oral, resp. rate 18, height 5\' 2"  (1.575 m), weight 63.5 kg (140 lb), SpO2 96 %.  Intake/Output Summary (Last 24 hours) at 02/11/2018 1028 Last data filed at 02/11/2018 1000 Gross per 24 hour  Intake 240 ml  Output -  Net 240 ml  Filed Weights   02/07/18 1455  Weight: 63.5 kg (140 lb)    Exam: Awake No new F.N deficits, Normal affect [place].AT,PERRAL Supple Neck,No JVD, No cervical lymphadenopathy appriciated.  Symmetrical Chest wall movement, Good air movement bilaterally, CTAB RRR,No Gallops,Rubs or new Murmurs, No Parasternal Heave +ve B.Sounds, Abd Soft, Non tender, No organomegaly appriciated, No rebound -guarding or rigidity. No Cyanosis, Clubbing or edema, No new Rash or bruise   PERTINENT RADIOLOGIC STUDIES: Dg Chest 2 View  Result Date: 02/07/2018 CLINICAL DATA:  Weakness for 3 days, diabetes mellitus, pneumonia EXAM: CHEST - 2 VIEW COMPARISON:  12/05/2017, 06/18/2017 FINDINGS: Normal heart size, mediastinal contours, and pulmonary vascularity. Atherosclerotic calcifications aorta. Chronic bibasilar infiltrates slightly greater on LEFT question fibrosis. No definite acute infiltrate, pleural effusion or pneumothorax. New diffuse osseous demineralization.  IMPRESSION: Chronic bibasilar interstitial changes likely representing pulmonary fibrosis. No definite acute infiltrate. Electronically Signed   By: Ulyses Southward M.D.   On: 02/07/2018 18:04     PERTINENT LAB RESULTS: CBC: No results for input(s): WBC, HGB, HCT, PLT in the last 72 hours. CMET CMP     Component Value Date/Time   NA 147 (H) 02/11/2018 0630   NA 139 06/30/2016 1116   K 4.3 02/11/2018 0630   CL 116 (H) 02/11/2018 0630   CO2 25 02/11/2018 0630   GLUCOSE 221 (H) 02/11/2018 0630   BUN 20 02/11/2018 0630   BUN 16 06/30/2016 1116   CREATININE 0.79 02/11/2018 0630   CALCIUM 9.4 02/11/2018 0630   PROT 7.6 02/07/2018 1633   PROT 7.6 06/30/2016 1116   ALBUMIN 2.7 (L) 02/07/2018 1633   ALBUMIN 3.4 06/30/2016 1116   AST 30 02/07/2018 1633   ALT 33 02/07/2018 1633   ALKPHOS 219 (H) 02/07/2018 1633   BILITOT 0.4 02/07/2018 1633   BILITOT <0.2 06/30/2016 1116   GFRNONAA >60 02/11/2018 0630   GFRAA >60 02/11/2018 0630    GFR Estimated Creatinine Clearance: 37.7 mL/min (by C-G formula based on SCr of 0.79 mg/dL). No results for input(s): LIPASE, AMYLASE in the last 72 hours. No results for input(s): CKTOTAL, CKMB, CKMBINDEX, TROPONINI in the last 72 hours. Invalid input(s): POCBNP No results for input(s): DDIMER in the last 72 hours. No results for input(s): HGBA1C in the last 72 hours. No results for input(s): CHOL, HDL, LDLCALC, TRIG, CHOLHDL, LDLDIRECT in the last 72 hours. No results for input(s): TSH, T4TOTAL, T3FREE, THYROIDAB in the last 72 hours.  Invalid input(s): FREET3 No results for input(s): VITAMINB12, FOLATE, FERRITIN, TIBC, IRON, RETICCTPCT in the last 72 hours. Coags: No results for input(s): INR in the last 72 hours.  Invalid input(s): PT Microbiology: Recent Results (from the past 240 hour(s))  Urine Culture     Status: Abnormal   Collection Time: 02/08/18 12:38 AM  Result Value Ref Range Status   Specimen Description URINE, RANDOM  Final    Special Requests   Final    NONE Performed at [facility], [address] [place], [place] [address]    Culture >=100,000 COLONIES/mL ENTEROBACTER AEROGENES (A)  Final   Report Status 02/10/2018 FINAL  Final   Organism ID, Bacteria ENTEROBACTER AEROGENES (A)  Final      Susceptibility   Enterobacter aerogenes - MIC*    CEFAZOLIN >=64 RESISTANT Resistant     CEFTRIAXONE >=64 RESISTANT Resistant     CIPROFLOXACIN 1 SENSITIVE Sensitive     GENTAMICIN <=1 SENSITIVE Sensitive     IMIPENEM 1 SENSITIVE Sensitive  NITROFURANTOIN 128 RESISTANT Resistant     TRIMETH/SULFA >=320 RESISTANT Resistant     PIP/TAZO <=4 SENSITIVE Sensitive     * >=100,000 COLONIES/mL ENTEROBACTER AEROGENES    FURTHER DISCHARGE INSTRUCTIONS:  Get Medicines reviewed and adjusted: Please take all your medications with you for your next visit with your Primary MD  Laboratory/radiological data: Please request your Primary MD to go over all hospital tests and procedure/radiological results at the follow up, please ask your Primary MD to get all Hospital records sent to his/her office.  In some cases, they will be blood work, cultures and biopsy results pending at the time of your discharge. Please request that your primary care M.D. goes through all the records of your hospital data and follows up on these results.  Also Note the following: If you experience worsening of your admission symptoms, develop shortness of breath, life threatening emergency, suicidal or homicidal thoughts you must seek medical attention immediately by calling 911 or calling your MD immediately  if symptoms less severe.  You must read complete instructions/literature along with all the possible adverse reactions/side effects for all the Medicines you take and that have been prescribed to you. Take any new Medicines after you have completely understood and accpet all the possible adverse reactions/side effects.   Do not drive when  taking Pain medications or sleeping medications (Benzodaizepines)  Do not take more than prescribed Pain, Sleep and Anxiety Medications. It is not advisable to combine anxiety,sleep and pain medications without talking with your primary care practitioner  Special Instructions: If you have smoked or chewed Tobacco  in the last 2 yrs please stop smoking, stop any regular Alcohol  and or any Recreational drug use.  Wear Seat belts while driving.  Please note: You were cared for by a hospitalist during your hospital stay. Once you are discharged, your primary care physician will handle any further medical issues. Please note that NO REFILLS for any discharge medications will be authorized once you are discharged, as it is imperative that you return to your primary care physician (or establish a relationship with a primary care physician if you do not have one) for your post hospital discharge needs so that they can reassess your need for medications and monitor your lab values.  Total Time spent coordinating discharge including counseling, education and face to face time equals 45 minutes.  SignedJeoffrey Massed 02/11/2018 10:28 AM

## 2018-02-20 DIAGNOSIS — F0280 Dementia in other diseases classified elsewhere without behavioral disturbance: Secondary | ICD-10-CM | POA: Diagnosis not present

## 2018-02-20 DIAGNOSIS — G40209 Localization-related (focal) (partial) symptomatic epilepsy and epileptic syndromes with complex partial seizures, not intractable, without status epilepticus: Secondary | ICD-10-CM | POA: Diagnosis not present

## 2018-02-20 DIAGNOSIS — L89322 Pressure ulcer of left buttock, stage 2: Secondary | ICD-10-CM | POA: Diagnosis not present

## 2018-02-20 DIAGNOSIS — L89152 Pressure ulcer of sacral region, stage 2: Secondary | ICD-10-CM | POA: Diagnosis not present

## 2018-02-20 DIAGNOSIS — L89312 Pressure ulcer of right buttock, stage 2: Secondary | ICD-10-CM | POA: Diagnosis not present

## 2018-02-20 DIAGNOSIS — Z87891 Personal history of nicotine dependence: Secondary | ICD-10-CM | POA: Diagnosis not present

## 2018-02-20 DIAGNOSIS — I10 Essential (primary) hypertension: Secondary | ICD-10-CM | POA: Diagnosis not present

## 2018-02-20 DIAGNOSIS — R1310 Dysphagia, unspecified: Secondary | ICD-10-CM | POA: Diagnosis not present

## 2018-02-20 DIAGNOSIS — G3183 Dementia with Lewy bodies: Secondary | ICD-10-CM | POA: Diagnosis not present

## 2018-02-20 DIAGNOSIS — I69351 Hemiplegia and hemiparesis following cerebral infarction affecting right dominant side: Secondary | ICD-10-CM | POA: Diagnosis not present

## 2018-02-20 DIAGNOSIS — E119 Type 2 diabetes mellitus without complications: Secondary | ICD-10-CM | POA: Diagnosis not present

## 2018-02-21 DIAGNOSIS — F0280 Dementia in other diseases classified elsewhere without behavioral disturbance: Secondary | ICD-10-CM | POA: Diagnosis not present

## 2018-02-21 DIAGNOSIS — I69351 Hemiplegia and hemiparesis following cerebral infarction affecting right dominant side: Secondary | ICD-10-CM | POA: Diagnosis not present

## 2018-02-21 DIAGNOSIS — N390 Urinary tract infection, site not specified: Secondary | ICD-10-CM | POA: Diagnosis not present

## 2018-02-21 DIAGNOSIS — G3183 Dementia with Lewy bodies: Secondary | ICD-10-CM | POA: Diagnosis not present

## 2018-02-21 DIAGNOSIS — Z794 Long term (current) use of insulin: Secondary | ICD-10-CM | POA: Diagnosis not present

## 2018-02-21 DIAGNOSIS — L89312 Pressure ulcer of right buttock, stage 2: Secondary | ICD-10-CM | POA: Diagnosis not present

## 2018-02-21 DIAGNOSIS — R1310 Dysphagia, unspecified: Secondary | ICD-10-CM | POA: Diagnosis not present

## 2018-02-21 DIAGNOSIS — L89322 Pressure ulcer of left buttock, stage 2: Secondary | ICD-10-CM | POA: Diagnosis not present

## 2018-02-21 DIAGNOSIS — E870 Hyperosmolality and hypernatremia: Secondary | ICD-10-CM | POA: Diagnosis not present

## 2018-02-21 DIAGNOSIS — E1142 Type 2 diabetes mellitus with diabetic polyneuropathy: Secondary | ICD-10-CM | POA: Diagnosis not present

## 2018-02-22 DIAGNOSIS — G3183 Dementia with Lewy bodies: Secondary | ICD-10-CM | POA: Diagnosis not present

## 2018-02-22 DIAGNOSIS — F0280 Dementia in other diseases classified elsewhere without behavioral disturbance: Secondary | ICD-10-CM | POA: Diagnosis not present

## 2018-02-22 DIAGNOSIS — L89312 Pressure ulcer of right buttock, stage 2: Secondary | ICD-10-CM | POA: Diagnosis not present

## 2018-02-22 DIAGNOSIS — I69351 Hemiplegia and hemiparesis following cerebral infarction affecting right dominant side: Secondary | ICD-10-CM | POA: Diagnosis not present

## 2018-02-22 DIAGNOSIS — R1310 Dysphagia, unspecified: Secondary | ICD-10-CM | POA: Diagnosis not present

## 2018-02-22 DIAGNOSIS — L89322 Pressure ulcer of left buttock, stage 2: Secondary | ICD-10-CM | POA: Diagnosis not present

## 2018-02-25 DIAGNOSIS — F0280 Dementia in other diseases classified elsewhere without behavioral disturbance: Secondary | ICD-10-CM | POA: Diagnosis not present

## 2018-02-25 DIAGNOSIS — L89312 Pressure ulcer of right buttock, stage 2: Secondary | ICD-10-CM | POA: Diagnosis not present

## 2018-02-25 DIAGNOSIS — R1310 Dysphagia, unspecified: Secondary | ICD-10-CM | POA: Diagnosis not present

## 2018-02-25 DIAGNOSIS — G3183 Dementia with Lewy bodies: Secondary | ICD-10-CM | POA: Diagnosis not present

## 2018-02-25 DIAGNOSIS — I69351 Hemiplegia and hemiparesis following cerebral infarction affecting right dominant side: Secondary | ICD-10-CM | POA: Diagnosis not present

## 2018-02-25 DIAGNOSIS — L89322 Pressure ulcer of left buttock, stage 2: Secondary | ICD-10-CM | POA: Diagnosis not present

## 2018-02-28 DIAGNOSIS — R1310 Dysphagia, unspecified: Secondary | ICD-10-CM | POA: Diagnosis not present

## 2018-02-28 DIAGNOSIS — I69351 Hemiplegia and hemiparesis following cerebral infarction affecting right dominant side: Secondary | ICD-10-CM | POA: Diagnosis not present

## 2018-02-28 DIAGNOSIS — F0280 Dementia in other diseases classified elsewhere without behavioral disturbance: Secondary | ICD-10-CM | POA: Diagnosis not present

## 2018-02-28 DIAGNOSIS — L89322 Pressure ulcer of left buttock, stage 2: Secondary | ICD-10-CM | POA: Diagnosis not present

## 2018-02-28 DIAGNOSIS — L89312 Pressure ulcer of right buttock, stage 2: Secondary | ICD-10-CM | POA: Diagnosis not present

## 2018-02-28 DIAGNOSIS — G3183 Dementia with Lewy bodies: Secondary | ICD-10-CM | POA: Diagnosis not present

## 2018-03-02 DIAGNOSIS — I69351 Hemiplegia and hemiparesis following cerebral infarction affecting right dominant side: Secondary | ICD-10-CM | POA: Diagnosis not present

## 2018-03-02 DIAGNOSIS — G3183 Dementia with Lewy bodies: Secondary | ICD-10-CM | POA: Diagnosis not present

## 2018-03-02 DIAGNOSIS — F0280 Dementia in other diseases classified elsewhere without behavioral disturbance: Secondary | ICD-10-CM | POA: Diagnosis not present

## 2018-03-02 DIAGNOSIS — L89322 Pressure ulcer of left buttock, stage 2: Secondary | ICD-10-CM | POA: Diagnosis not present

## 2018-03-02 DIAGNOSIS — L89312 Pressure ulcer of right buttock, stage 2: Secondary | ICD-10-CM | POA: Diagnosis not present

## 2018-03-02 DIAGNOSIS — R1310 Dysphagia, unspecified: Secondary | ICD-10-CM | POA: Diagnosis not present

## 2018-03-04 DIAGNOSIS — R1310 Dysphagia, unspecified: Secondary | ICD-10-CM | POA: Diagnosis not present

## 2018-03-04 DIAGNOSIS — I69351 Hemiplegia and hemiparesis following cerebral infarction affecting right dominant side: Secondary | ICD-10-CM | POA: Diagnosis not present

## 2018-03-04 DIAGNOSIS — L89312 Pressure ulcer of right buttock, stage 2: Secondary | ICD-10-CM | POA: Diagnosis not present

## 2018-03-04 DIAGNOSIS — F0280 Dementia in other diseases classified elsewhere without behavioral disturbance: Secondary | ICD-10-CM | POA: Diagnosis not present

## 2018-03-04 DIAGNOSIS — L89322 Pressure ulcer of left buttock, stage 2: Secondary | ICD-10-CM | POA: Diagnosis not present

## 2018-03-04 DIAGNOSIS — G3183 Dementia with Lewy bodies: Secondary | ICD-10-CM | POA: Diagnosis not present

## 2018-03-07 DIAGNOSIS — L89312 Pressure ulcer of right buttock, stage 2: Secondary | ICD-10-CM | POA: Diagnosis not present

## 2018-03-07 DIAGNOSIS — R1310 Dysphagia, unspecified: Secondary | ICD-10-CM | POA: Diagnosis not present

## 2018-03-07 DIAGNOSIS — F0280 Dementia in other diseases classified elsewhere without behavioral disturbance: Secondary | ICD-10-CM | POA: Diagnosis not present

## 2018-03-07 DIAGNOSIS — G3183 Dementia with Lewy bodies: Secondary | ICD-10-CM | POA: Diagnosis not present

## 2018-03-07 DIAGNOSIS — L89322 Pressure ulcer of left buttock, stage 2: Secondary | ICD-10-CM | POA: Diagnosis not present

## 2018-03-07 DIAGNOSIS — I69351 Hemiplegia and hemiparesis following cerebral infarction affecting right dominant side: Secondary | ICD-10-CM | POA: Diagnosis not present

## 2018-03-09 DIAGNOSIS — I69351 Hemiplegia and hemiparesis following cerebral infarction affecting right dominant side: Secondary | ICD-10-CM | POA: Diagnosis not present

## 2018-03-09 DIAGNOSIS — R1310 Dysphagia, unspecified: Secondary | ICD-10-CM | POA: Diagnosis not present

## 2018-03-09 DIAGNOSIS — L89312 Pressure ulcer of right buttock, stage 2: Secondary | ICD-10-CM | POA: Diagnosis not present

## 2018-03-09 DIAGNOSIS — G3183 Dementia with Lewy bodies: Secondary | ICD-10-CM | POA: Diagnosis not present

## 2018-03-09 DIAGNOSIS — F0280 Dementia in other diseases classified elsewhere without behavioral disturbance: Secondary | ICD-10-CM | POA: Diagnosis not present

## 2018-03-09 DIAGNOSIS — L89322 Pressure ulcer of left buttock, stage 2: Secondary | ICD-10-CM | POA: Diagnosis not present

## 2018-03-11 DIAGNOSIS — I69351 Hemiplegia and hemiparesis following cerebral infarction affecting right dominant side: Secondary | ICD-10-CM | POA: Diagnosis not present

## 2018-03-11 DIAGNOSIS — L89312 Pressure ulcer of right buttock, stage 2: Secondary | ICD-10-CM | POA: Diagnosis not present

## 2018-03-11 DIAGNOSIS — F0280 Dementia in other diseases classified elsewhere without behavioral disturbance: Secondary | ICD-10-CM | POA: Diagnosis not present

## 2018-03-11 DIAGNOSIS — G3183 Dementia with Lewy bodies: Secondary | ICD-10-CM | POA: Diagnosis not present

## 2018-03-11 DIAGNOSIS — L89322 Pressure ulcer of left buttock, stage 2: Secondary | ICD-10-CM | POA: Diagnosis not present

## 2018-03-11 DIAGNOSIS — R1310 Dysphagia, unspecified: Secondary | ICD-10-CM | POA: Diagnosis not present

## 2018-03-14 DIAGNOSIS — I69351 Hemiplegia and hemiparesis following cerebral infarction affecting right dominant side: Secondary | ICD-10-CM | POA: Diagnosis not present

## 2018-03-14 DIAGNOSIS — L89312 Pressure ulcer of right buttock, stage 2: Secondary | ICD-10-CM | POA: Diagnosis not present

## 2018-03-14 DIAGNOSIS — R1310 Dysphagia, unspecified: Secondary | ICD-10-CM | POA: Diagnosis not present

## 2018-03-14 DIAGNOSIS — L89322 Pressure ulcer of left buttock, stage 2: Secondary | ICD-10-CM | POA: Diagnosis not present

## 2018-03-14 DIAGNOSIS — F0280 Dementia in other diseases classified elsewhere without behavioral disturbance: Secondary | ICD-10-CM | POA: Diagnosis not present

## 2018-03-14 DIAGNOSIS — G3183 Dementia with Lewy bodies: Secondary | ICD-10-CM | POA: Diagnosis not present

## 2018-03-16 DIAGNOSIS — L89312 Pressure ulcer of right buttock, stage 2: Secondary | ICD-10-CM | POA: Diagnosis not present

## 2018-03-16 DIAGNOSIS — G3183 Dementia with Lewy bodies: Secondary | ICD-10-CM | POA: Diagnosis not present

## 2018-03-16 DIAGNOSIS — F0280 Dementia in other diseases classified elsewhere without behavioral disturbance: Secondary | ICD-10-CM | POA: Diagnosis not present

## 2018-03-16 DIAGNOSIS — R1310 Dysphagia, unspecified: Secondary | ICD-10-CM | POA: Diagnosis not present

## 2018-03-16 DIAGNOSIS — I69351 Hemiplegia and hemiparesis following cerebral infarction affecting right dominant side: Secondary | ICD-10-CM | POA: Diagnosis not present

## 2018-03-16 DIAGNOSIS — L89322 Pressure ulcer of left buttock, stage 2: Secondary | ICD-10-CM | POA: Diagnosis not present

## 2018-03-18 DIAGNOSIS — I69351 Hemiplegia and hemiparesis following cerebral infarction affecting right dominant side: Secondary | ICD-10-CM | POA: Diagnosis not present

## 2018-03-18 DIAGNOSIS — L89322 Pressure ulcer of left buttock, stage 2: Secondary | ICD-10-CM | POA: Diagnosis not present

## 2018-03-18 DIAGNOSIS — G3183 Dementia with Lewy bodies: Secondary | ICD-10-CM | POA: Diagnosis not present

## 2018-03-18 DIAGNOSIS — F0280 Dementia in other diseases classified elsewhere without behavioral disturbance: Secondary | ICD-10-CM | POA: Diagnosis not present

## 2018-03-18 DIAGNOSIS — R1310 Dysphagia, unspecified: Secondary | ICD-10-CM | POA: Diagnosis not present

## 2018-03-18 DIAGNOSIS — L89312 Pressure ulcer of right buttock, stage 2: Secondary | ICD-10-CM | POA: Diagnosis not present

## 2018-03-21 DIAGNOSIS — I69351 Hemiplegia and hemiparesis following cerebral infarction affecting right dominant side: Secondary | ICD-10-CM | POA: Diagnosis not present

## 2018-03-21 DIAGNOSIS — L89322 Pressure ulcer of left buttock, stage 2: Secondary | ICD-10-CM | POA: Diagnosis not present

## 2018-03-21 DIAGNOSIS — R1310 Dysphagia, unspecified: Secondary | ICD-10-CM | POA: Diagnosis not present

## 2018-03-21 DIAGNOSIS — G3183 Dementia with Lewy bodies: Secondary | ICD-10-CM | POA: Diagnosis not present

## 2018-03-21 DIAGNOSIS — L89312 Pressure ulcer of right buttock, stage 2: Secondary | ICD-10-CM | POA: Diagnosis not present

## 2018-03-21 DIAGNOSIS — F0280 Dementia in other diseases classified elsewhere without behavioral disturbance: Secondary | ICD-10-CM | POA: Diagnosis not present

## 2018-03-23 DIAGNOSIS — L89152 Pressure ulcer of sacral region, stage 2: Secondary | ICD-10-CM | POA: Diagnosis not present

## 2018-03-23 DIAGNOSIS — Z87891 Personal history of nicotine dependence: Secondary | ICD-10-CM | POA: Diagnosis not present

## 2018-03-23 DIAGNOSIS — F0280 Dementia in other diseases classified elsewhere without behavioral disturbance: Secondary | ICD-10-CM | POA: Diagnosis not present

## 2018-03-23 DIAGNOSIS — R1310 Dysphagia, unspecified: Secondary | ICD-10-CM | POA: Diagnosis not present

## 2018-03-23 DIAGNOSIS — L89322 Pressure ulcer of left buttock, stage 2: Secondary | ICD-10-CM | POA: Diagnosis not present

## 2018-03-23 DIAGNOSIS — I10 Essential (primary) hypertension: Secondary | ICD-10-CM | POA: Diagnosis not present

## 2018-03-23 DIAGNOSIS — G40209 Localization-related (focal) (partial) symptomatic epilepsy and epileptic syndromes with complex partial seizures, not intractable, without status epilepticus: Secondary | ICD-10-CM | POA: Diagnosis not present

## 2018-03-23 DIAGNOSIS — L89312 Pressure ulcer of right buttock, stage 2: Secondary | ICD-10-CM | POA: Diagnosis not present

## 2018-03-23 DIAGNOSIS — I69351 Hemiplegia and hemiparesis following cerebral infarction affecting right dominant side: Secondary | ICD-10-CM | POA: Diagnosis not present

## 2018-03-23 DIAGNOSIS — G3183 Dementia with Lewy bodies: Secondary | ICD-10-CM | POA: Diagnosis not present

## 2018-03-23 DIAGNOSIS — E119 Type 2 diabetes mellitus without complications: Secondary | ICD-10-CM | POA: Diagnosis not present

## 2018-03-24 DIAGNOSIS — L89312 Pressure ulcer of right buttock, stage 2: Secondary | ICD-10-CM | POA: Diagnosis not present

## 2018-03-24 DIAGNOSIS — L89322 Pressure ulcer of left buttock, stage 2: Secondary | ICD-10-CM | POA: Diagnosis not present

## 2018-03-24 DIAGNOSIS — F0280 Dementia in other diseases classified elsewhere without behavioral disturbance: Secondary | ICD-10-CM | POA: Diagnosis not present

## 2018-03-24 DIAGNOSIS — I69351 Hemiplegia and hemiparesis following cerebral infarction affecting right dominant side: Secondary | ICD-10-CM | POA: Diagnosis not present

## 2018-03-24 DIAGNOSIS — G3183 Dementia with Lewy bodies: Secondary | ICD-10-CM | POA: Diagnosis not present

## 2018-03-24 DIAGNOSIS — R1310 Dysphagia, unspecified: Secondary | ICD-10-CM | POA: Diagnosis not present

## 2018-03-25 DIAGNOSIS — I69351 Hemiplegia and hemiparesis following cerebral infarction affecting right dominant side: Secondary | ICD-10-CM | POA: Diagnosis not present

## 2018-03-25 DIAGNOSIS — R1310 Dysphagia, unspecified: Secondary | ICD-10-CM | POA: Diagnosis not present

## 2018-03-25 DIAGNOSIS — G3183 Dementia with Lewy bodies: Secondary | ICD-10-CM | POA: Diagnosis not present

## 2018-03-25 DIAGNOSIS — L89312 Pressure ulcer of right buttock, stage 2: Secondary | ICD-10-CM | POA: Diagnosis not present

## 2018-03-25 DIAGNOSIS — F0280 Dementia in other diseases classified elsewhere without behavioral disturbance: Secondary | ICD-10-CM | POA: Diagnosis not present

## 2018-03-25 DIAGNOSIS — L89322 Pressure ulcer of left buttock, stage 2: Secondary | ICD-10-CM | POA: Diagnosis not present

## 2018-03-28 DIAGNOSIS — L89322 Pressure ulcer of left buttock, stage 2: Secondary | ICD-10-CM | POA: Diagnosis not present

## 2018-03-28 DIAGNOSIS — I69351 Hemiplegia and hemiparesis following cerebral infarction affecting right dominant side: Secondary | ICD-10-CM | POA: Diagnosis not present

## 2018-03-28 DIAGNOSIS — F0280 Dementia in other diseases classified elsewhere without behavioral disturbance: Secondary | ICD-10-CM | POA: Diagnosis not present

## 2018-03-28 DIAGNOSIS — L89312 Pressure ulcer of right buttock, stage 2: Secondary | ICD-10-CM | POA: Diagnosis not present

## 2018-03-28 DIAGNOSIS — R1310 Dysphagia, unspecified: Secondary | ICD-10-CM | POA: Diagnosis not present

## 2018-03-28 DIAGNOSIS — G3183 Dementia with Lewy bodies: Secondary | ICD-10-CM | POA: Diagnosis not present

## 2018-03-30 DIAGNOSIS — L89322 Pressure ulcer of left buttock, stage 2: Secondary | ICD-10-CM | POA: Diagnosis not present

## 2018-03-30 DIAGNOSIS — R1310 Dysphagia, unspecified: Secondary | ICD-10-CM | POA: Diagnosis not present

## 2018-03-30 DIAGNOSIS — L89312 Pressure ulcer of right buttock, stage 2: Secondary | ICD-10-CM | POA: Diagnosis not present

## 2018-03-30 DIAGNOSIS — I69351 Hemiplegia and hemiparesis following cerebral infarction affecting right dominant side: Secondary | ICD-10-CM | POA: Diagnosis not present

## 2018-03-30 DIAGNOSIS — F0280 Dementia in other diseases classified elsewhere without behavioral disturbance: Secondary | ICD-10-CM | POA: Diagnosis not present

## 2018-03-30 DIAGNOSIS — G3183 Dementia with Lewy bodies: Secondary | ICD-10-CM | POA: Diagnosis not present

## 2018-04-01 DIAGNOSIS — L89312 Pressure ulcer of right buttock, stage 2: Secondary | ICD-10-CM | POA: Diagnosis not present

## 2018-04-01 DIAGNOSIS — I69351 Hemiplegia and hemiparesis following cerebral infarction affecting right dominant side: Secondary | ICD-10-CM | POA: Diagnosis not present

## 2018-04-01 DIAGNOSIS — G3183 Dementia with Lewy bodies: Secondary | ICD-10-CM | POA: Diagnosis not present

## 2018-04-01 DIAGNOSIS — F0280 Dementia in other diseases classified elsewhere without behavioral disturbance: Secondary | ICD-10-CM | POA: Diagnosis not present

## 2018-04-01 DIAGNOSIS — L89322 Pressure ulcer of left buttock, stage 2: Secondary | ICD-10-CM | POA: Diagnosis not present

## 2018-04-01 DIAGNOSIS — R1310 Dysphagia, unspecified: Secondary | ICD-10-CM | POA: Diagnosis not present

## 2018-04-06 DIAGNOSIS — I69351 Hemiplegia and hemiparesis following cerebral infarction affecting right dominant side: Secondary | ICD-10-CM | POA: Diagnosis not present

## 2018-04-06 DIAGNOSIS — L89322 Pressure ulcer of left buttock, stage 2: Secondary | ICD-10-CM | POA: Diagnosis not present

## 2018-04-06 DIAGNOSIS — R1310 Dysphagia, unspecified: Secondary | ICD-10-CM | POA: Diagnosis not present

## 2018-04-06 DIAGNOSIS — G3183 Dementia with Lewy bodies: Secondary | ICD-10-CM | POA: Diagnosis not present

## 2018-04-06 DIAGNOSIS — F0280 Dementia in other diseases classified elsewhere without behavioral disturbance: Secondary | ICD-10-CM | POA: Diagnosis not present

## 2018-04-06 DIAGNOSIS — L89312 Pressure ulcer of right buttock, stage 2: Secondary | ICD-10-CM | POA: Diagnosis not present

## 2018-04-08 DIAGNOSIS — L89312 Pressure ulcer of right buttock, stage 2: Secondary | ICD-10-CM | POA: Diagnosis not present

## 2018-04-08 DIAGNOSIS — R1310 Dysphagia, unspecified: Secondary | ICD-10-CM | POA: Diagnosis not present

## 2018-04-08 DIAGNOSIS — L89322 Pressure ulcer of left buttock, stage 2: Secondary | ICD-10-CM | POA: Diagnosis not present

## 2018-04-08 DIAGNOSIS — I69351 Hemiplegia and hemiparesis following cerebral infarction affecting right dominant side: Secondary | ICD-10-CM | POA: Diagnosis not present

## 2018-04-08 DIAGNOSIS — G3183 Dementia with Lewy bodies: Secondary | ICD-10-CM | POA: Diagnosis not present

## 2018-04-08 DIAGNOSIS — F0280 Dementia in other diseases classified elsewhere without behavioral disturbance: Secondary | ICD-10-CM | POA: Diagnosis not present

## 2018-04-11 DIAGNOSIS — G3183 Dementia with Lewy bodies: Secondary | ICD-10-CM | POA: Diagnosis not present

## 2018-04-11 DIAGNOSIS — L89312 Pressure ulcer of right buttock, stage 2: Secondary | ICD-10-CM | POA: Diagnosis not present

## 2018-04-11 DIAGNOSIS — R1310 Dysphagia, unspecified: Secondary | ICD-10-CM | POA: Diagnosis not present

## 2018-04-11 DIAGNOSIS — I69351 Hemiplegia and hemiparesis following cerebral infarction affecting right dominant side: Secondary | ICD-10-CM | POA: Diagnosis not present

## 2018-04-11 DIAGNOSIS — F0280 Dementia in other diseases classified elsewhere without behavioral disturbance: Secondary | ICD-10-CM | POA: Diagnosis not present

## 2018-04-11 DIAGNOSIS — L89322 Pressure ulcer of left buttock, stage 2: Secondary | ICD-10-CM | POA: Diagnosis not present

## 2018-04-13 DIAGNOSIS — L89312 Pressure ulcer of right buttock, stage 2: Secondary | ICD-10-CM | POA: Diagnosis not present

## 2018-04-13 DIAGNOSIS — I69351 Hemiplegia and hemiparesis following cerebral infarction affecting right dominant side: Secondary | ICD-10-CM | POA: Diagnosis not present

## 2018-04-13 DIAGNOSIS — L89322 Pressure ulcer of left buttock, stage 2: Secondary | ICD-10-CM | POA: Diagnosis not present

## 2018-04-13 DIAGNOSIS — R1310 Dysphagia, unspecified: Secondary | ICD-10-CM | POA: Diagnosis not present

## 2018-04-13 DIAGNOSIS — F0280 Dementia in other diseases classified elsewhere without behavioral disturbance: Secondary | ICD-10-CM | POA: Diagnosis not present

## 2018-04-13 DIAGNOSIS — G3183 Dementia with Lewy bodies: Secondary | ICD-10-CM | POA: Diagnosis not present

## 2018-04-15 DIAGNOSIS — G3183 Dementia with Lewy bodies: Secondary | ICD-10-CM | POA: Diagnosis not present

## 2018-04-15 DIAGNOSIS — L89322 Pressure ulcer of left buttock, stage 2: Secondary | ICD-10-CM | POA: Diagnosis not present

## 2018-04-15 DIAGNOSIS — F0280 Dementia in other diseases classified elsewhere without behavioral disturbance: Secondary | ICD-10-CM | POA: Diagnosis not present

## 2018-04-15 DIAGNOSIS — L89312 Pressure ulcer of right buttock, stage 2: Secondary | ICD-10-CM | POA: Diagnosis not present

## 2018-04-15 DIAGNOSIS — R1310 Dysphagia, unspecified: Secondary | ICD-10-CM | POA: Diagnosis not present

## 2018-04-15 DIAGNOSIS — I69351 Hemiplegia and hemiparesis following cerebral infarction affecting right dominant side: Secondary | ICD-10-CM | POA: Diagnosis not present

## 2018-04-18 DIAGNOSIS — L89312 Pressure ulcer of right buttock, stage 2: Secondary | ICD-10-CM | POA: Diagnosis not present

## 2018-04-18 DIAGNOSIS — R1310 Dysphagia, unspecified: Secondary | ICD-10-CM | POA: Diagnosis not present

## 2018-04-18 DIAGNOSIS — F0280 Dementia in other diseases classified elsewhere without behavioral disturbance: Secondary | ICD-10-CM | POA: Diagnosis not present

## 2018-04-18 DIAGNOSIS — I69351 Hemiplegia and hemiparesis following cerebral infarction affecting right dominant side: Secondary | ICD-10-CM | POA: Diagnosis not present

## 2018-04-18 DIAGNOSIS — G3183 Dementia with Lewy bodies: Secondary | ICD-10-CM | POA: Diagnosis not present

## 2018-04-18 DIAGNOSIS — L89322 Pressure ulcer of left buttock, stage 2: Secondary | ICD-10-CM | POA: Diagnosis not present

## 2018-04-19 DIAGNOSIS — I69351 Hemiplegia and hemiparesis following cerebral infarction affecting right dominant side: Secondary | ICD-10-CM | POA: Diagnosis not present

## 2018-04-19 DIAGNOSIS — G3183 Dementia with Lewy bodies: Secondary | ICD-10-CM | POA: Diagnosis not present

## 2018-04-19 DIAGNOSIS — F0280 Dementia in other diseases classified elsewhere without behavioral disturbance: Secondary | ICD-10-CM | POA: Diagnosis not present

## 2018-04-19 DIAGNOSIS — L89322 Pressure ulcer of left buttock, stage 2: Secondary | ICD-10-CM | POA: Diagnosis not present

## 2018-04-19 DIAGNOSIS — L89312 Pressure ulcer of right buttock, stage 2: Secondary | ICD-10-CM | POA: Diagnosis not present

## 2018-04-19 DIAGNOSIS — R1310 Dysphagia, unspecified: Secondary | ICD-10-CM | POA: Diagnosis not present

## 2018-04-20 DIAGNOSIS — L89312 Pressure ulcer of right buttock, stage 2: Secondary | ICD-10-CM | POA: Diagnosis not present

## 2018-04-20 DIAGNOSIS — F0280 Dementia in other diseases classified elsewhere without behavioral disturbance: Secondary | ICD-10-CM | POA: Diagnosis not present

## 2018-04-20 DIAGNOSIS — I69351 Hemiplegia and hemiparesis following cerebral infarction affecting right dominant side: Secondary | ICD-10-CM | POA: Diagnosis not present

## 2018-04-20 DIAGNOSIS — G3183 Dementia with Lewy bodies: Secondary | ICD-10-CM | POA: Diagnosis not present

## 2018-04-20 DIAGNOSIS — L89322 Pressure ulcer of left buttock, stage 2: Secondary | ICD-10-CM | POA: Diagnosis not present

## 2018-04-20 DIAGNOSIS — R1310 Dysphagia, unspecified: Secondary | ICD-10-CM | POA: Diagnosis not present

## 2018-04-22 DIAGNOSIS — I69351 Hemiplegia and hemiparesis following cerebral infarction affecting right dominant side: Secondary | ICD-10-CM | POA: Diagnosis not present

## 2018-04-22 DIAGNOSIS — L89312 Pressure ulcer of right buttock, stage 2: Secondary | ICD-10-CM | POA: Diagnosis not present

## 2018-04-22 DIAGNOSIS — L89322 Pressure ulcer of left buttock, stage 2: Secondary | ICD-10-CM | POA: Diagnosis not present

## 2018-04-22 DIAGNOSIS — G3183 Dementia with Lewy bodies: Secondary | ICD-10-CM | POA: Diagnosis not present

## 2018-04-22 DIAGNOSIS — R1310 Dysphagia, unspecified: Secondary | ICD-10-CM | POA: Diagnosis not present

## 2018-04-22 DIAGNOSIS — F0280 Dementia in other diseases classified elsewhere without behavioral disturbance: Secondary | ICD-10-CM | POA: Diagnosis not present

## 2018-04-23 DIAGNOSIS — I69351 Hemiplegia and hemiparesis following cerebral infarction affecting right dominant side: Secondary | ICD-10-CM | POA: Diagnosis not present

## 2018-04-23 DIAGNOSIS — L89322 Pressure ulcer of left buttock, stage 2: Secondary | ICD-10-CM | POA: Diagnosis not present

## 2018-04-23 DIAGNOSIS — L89312 Pressure ulcer of right buttock, stage 2: Secondary | ICD-10-CM | POA: Diagnosis not present

## 2018-04-23 DIAGNOSIS — I10 Essential (primary) hypertension: Secondary | ICD-10-CM | POA: Diagnosis not present

## 2018-04-23 DIAGNOSIS — G40209 Localization-related (focal) (partial) symptomatic epilepsy and epileptic syndromes with complex partial seizures, not intractable, without status epilepticus: Secondary | ICD-10-CM | POA: Diagnosis not present

## 2018-04-23 DIAGNOSIS — F0280 Dementia in other diseases classified elsewhere without behavioral disturbance: Secondary | ICD-10-CM | POA: Diagnosis not present

## 2018-04-23 DIAGNOSIS — E119 Type 2 diabetes mellitus without complications: Secondary | ICD-10-CM | POA: Diagnosis not present

## 2018-04-23 DIAGNOSIS — R1310 Dysphagia, unspecified: Secondary | ICD-10-CM | POA: Diagnosis not present

## 2018-04-23 DIAGNOSIS — Z87891 Personal history of nicotine dependence: Secondary | ICD-10-CM | POA: Diagnosis not present

## 2018-04-23 DIAGNOSIS — G3183 Dementia with Lewy bodies: Secondary | ICD-10-CM | POA: Diagnosis not present

## 2018-04-25 DIAGNOSIS — L89312 Pressure ulcer of right buttock, stage 2: Secondary | ICD-10-CM | POA: Diagnosis not present

## 2018-04-25 DIAGNOSIS — I69351 Hemiplegia and hemiparesis following cerebral infarction affecting right dominant side: Secondary | ICD-10-CM | POA: Diagnosis not present

## 2018-04-25 DIAGNOSIS — G3183 Dementia with Lewy bodies: Secondary | ICD-10-CM | POA: Diagnosis not present

## 2018-04-25 DIAGNOSIS — F0280 Dementia in other diseases classified elsewhere without behavioral disturbance: Secondary | ICD-10-CM | POA: Diagnosis not present

## 2018-04-25 DIAGNOSIS — L89322 Pressure ulcer of left buttock, stage 2: Secondary | ICD-10-CM | POA: Diagnosis not present

## 2018-04-25 DIAGNOSIS — R1310 Dysphagia, unspecified: Secondary | ICD-10-CM | POA: Diagnosis not present

## 2018-04-27 DIAGNOSIS — L89322 Pressure ulcer of left buttock, stage 2: Secondary | ICD-10-CM | POA: Diagnosis not present

## 2018-04-27 DIAGNOSIS — R1310 Dysphagia, unspecified: Secondary | ICD-10-CM | POA: Diagnosis not present

## 2018-04-27 DIAGNOSIS — G3183 Dementia with Lewy bodies: Secondary | ICD-10-CM | POA: Diagnosis not present

## 2018-04-27 DIAGNOSIS — L89312 Pressure ulcer of right buttock, stage 2: Secondary | ICD-10-CM | POA: Diagnosis not present

## 2018-04-27 DIAGNOSIS — I69351 Hemiplegia and hemiparesis following cerebral infarction affecting right dominant side: Secondary | ICD-10-CM | POA: Diagnosis not present

## 2018-04-27 DIAGNOSIS — F0280 Dementia in other diseases classified elsewhere without behavioral disturbance: Secondary | ICD-10-CM | POA: Diagnosis not present

## 2018-04-28 DIAGNOSIS — L89312 Pressure ulcer of right buttock, stage 2: Secondary | ICD-10-CM | POA: Diagnosis not present

## 2018-04-28 DIAGNOSIS — R1310 Dysphagia, unspecified: Secondary | ICD-10-CM | POA: Diagnosis not present

## 2018-04-28 DIAGNOSIS — G3183 Dementia with Lewy bodies: Secondary | ICD-10-CM | POA: Diagnosis not present

## 2018-04-28 DIAGNOSIS — I69351 Hemiplegia and hemiparesis following cerebral infarction affecting right dominant side: Secondary | ICD-10-CM | POA: Diagnosis not present

## 2018-04-28 DIAGNOSIS — L89322 Pressure ulcer of left buttock, stage 2: Secondary | ICD-10-CM | POA: Diagnosis not present

## 2018-04-28 DIAGNOSIS — F0280 Dementia in other diseases classified elsewhere without behavioral disturbance: Secondary | ICD-10-CM | POA: Diagnosis not present

## 2018-04-29 DIAGNOSIS — G3183 Dementia with Lewy bodies: Secondary | ICD-10-CM | POA: Diagnosis not present

## 2018-04-29 DIAGNOSIS — L89312 Pressure ulcer of right buttock, stage 2: Secondary | ICD-10-CM | POA: Diagnosis not present

## 2018-04-29 DIAGNOSIS — I69351 Hemiplegia and hemiparesis following cerebral infarction affecting right dominant side: Secondary | ICD-10-CM | POA: Diagnosis not present

## 2018-04-29 DIAGNOSIS — F0280 Dementia in other diseases classified elsewhere without behavioral disturbance: Secondary | ICD-10-CM | POA: Diagnosis not present

## 2018-04-29 DIAGNOSIS — R1310 Dysphagia, unspecified: Secondary | ICD-10-CM | POA: Diagnosis not present

## 2018-04-29 DIAGNOSIS — L89322 Pressure ulcer of left buttock, stage 2: Secondary | ICD-10-CM | POA: Diagnosis not present

## 2018-04-30 DIAGNOSIS — G3183 Dementia with Lewy bodies: Secondary | ICD-10-CM | POA: Diagnosis not present

## 2018-04-30 DIAGNOSIS — R1310 Dysphagia, unspecified: Secondary | ICD-10-CM | POA: Diagnosis not present

## 2018-04-30 DIAGNOSIS — F0280 Dementia in other diseases classified elsewhere without behavioral disturbance: Secondary | ICD-10-CM | POA: Diagnosis not present

## 2018-04-30 DIAGNOSIS — I69351 Hemiplegia and hemiparesis following cerebral infarction affecting right dominant side: Secondary | ICD-10-CM | POA: Diagnosis not present

## 2018-04-30 DIAGNOSIS — L89312 Pressure ulcer of right buttock, stage 2: Secondary | ICD-10-CM | POA: Diagnosis not present

## 2018-04-30 DIAGNOSIS — L89322 Pressure ulcer of left buttock, stage 2: Secondary | ICD-10-CM | POA: Diagnosis not present

## 2018-05-01 DIAGNOSIS — L89322 Pressure ulcer of left buttock, stage 2: Secondary | ICD-10-CM | POA: Diagnosis not present

## 2018-05-01 DIAGNOSIS — I69351 Hemiplegia and hemiparesis following cerebral infarction affecting right dominant side: Secondary | ICD-10-CM | POA: Diagnosis not present

## 2018-05-01 DIAGNOSIS — G3183 Dementia with Lewy bodies: Secondary | ICD-10-CM | POA: Diagnosis not present

## 2018-05-01 DIAGNOSIS — F0280 Dementia in other diseases classified elsewhere without behavioral disturbance: Secondary | ICD-10-CM | POA: Diagnosis not present

## 2018-05-01 DIAGNOSIS — L89312 Pressure ulcer of right buttock, stage 2: Secondary | ICD-10-CM | POA: Diagnosis not present

## 2018-05-01 DIAGNOSIS — R1310 Dysphagia, unspecified: Secondary | ICD-10-CM | POA: Diagnosis not present

## 2018-05-02 DIAGNOSIS — G3183 Dementia with Lewy bodies: Secondary | ICD-10-CM | POA: Diagnosis not present

## 2018-05-02 DIAGNOSIS — R1310 Dysphagia, unspecified: Secondary | ICD-10-CM | POA: Diagnosis not present

## 2018-05-02 DIAGNOSIS — F0280 Dementia in other diseases classified elsewhere without behavioral disturbance: Secondary | ICD-10-CM | POA: Diagnosis not present

## 2018-05-02 DIAGNOSIS — L89312 Pressure ulcer of right buttock, stage 2: Secondary | ICD-10-CM | POA: Diagnosis not present

## 2018-05-02 DIAGNOSIS — I69351 Hemiplegia and hemiparesis following cerebral infarction affecting right dominant side: Secondary | ICD-10-CM | POA: Diagnosis not present

## 2018-05-02 DIAGNOSIS — L89322 Pressure ulcer of left buttock, stage 2: Secondary | ICD-10-CM | POA: Diagnosis not present

## 2018-05-04 DIAGNOSIS — F0280 Dementia in other diseases classified elsewhere without behavioral disturbance: Secondary | ICD-10-CM | POA: Diagnosis not present

## 2018-05-04 DIAGNOSIS — R1310 Dysphagia, unspecified: Secondary | ICD-10-CM | POA: Diagnosis not present

## 2018-05-04 DIAGNOSIS — I69351 Hemiplegia and hemiparesis following cerebral infarction affecting right dominant side: Secondary | ICD-10-CM | POA: Diagnosis not present

## 2018-05-04 DIAGNOSIS — L89322 Pressure ulcer of left buttock, stage 2: Secondary | ICD-10-CM | POA: Diagnosis not present

## 2018-05-04 DIAGNOSIS — G3183 Dementia with Lewy bodies: Secondary | ICD-10-CM | POA: Diagnosis not present

## 2018-05-04 DIAGNOSIS — L89312 Pressure ulcer of right buttock, stage 2: Secondary | ICD-10-CM | POA: Diagnosis not present

## 2018-05-05 DIAGNOSIS — F0280 Dementia in other diseases classified elsewhere without behavioral disturbance: Secondary | ICD-10-CM | POA: Diagnosis not present

## 2018-05-05 DIAGNOSIS — G3183 Dementia with Lewy bodies: Secondary | ICD-10-CM | POA: Diagnosis not present

## 2018-05-05 DIAGNOSIS — L89322 Pressure ulcer of left buttock, stage 2: Secondary | ICD-10-CM | POA: Diagnosis not present

## 2018-05-05 DIAGNOSIS — I69351 Hemiplegia and hemiparesis following cerebral infarction affecting right dominant side: Secondary | ICD-10-CM | POA: Diagnosis not present

## 2018-05-05 DIAGNOSIS — L89312 Pressure ulcer of right buttock, stage 2: Secondary | ICD-10-CM | POA: Diagnosis not present

## 2018-05-05 DIAGNOSIS — R1310 Dysphagia, unspecified: Secondary | ICD-10-CM | POA: Diagnosis not present

## 2018-05-06 DIAGNOSIS — G3183 Dementia with Lewy bodies: Secondary | ICD-10-CM | POA: Diagnosis not present

## 2018-05-06 DIAGNOSIS — I69351 Hemiplegia and hemiparesis following cerebral infarction affecting right dominant side: Secondary | ICD-10-CM | POA: Diagnosis not present

## 2018-05-06 DIAGNOSIS — L89322 Pressure ulcer of left buttock, stage 2: Secondary | ICD-10-CM | POA: Diagnosis not present

## 2018-05-06 DIAGNOSIS — F0280 Dementia in other diseases classified elsewhere without behavioral disturbance: Secondary | ICD-10-CM | POA: Diagnosis not present

## 2018-05-06 DIAGNOSIS — R1310 Dysphagia, unspecified: Secondary | ICD-10-CM | POA: Diagnosis not present

## 2018-05-06 DIAGNOSIS — L89312 Pressure ulcer of right buttock, stage 2: Secondary | ICD-10-CM | POA: Diagnosis not present

## 2018-05-09 DIAGNOSIS — F0280 Dementia in other diseases classified elsewhere without behavioral disturbance: Secondary | ICD-10-CM | POA: Diagnosis not present

## 2018-05-09 DIAGNOSIS — I69351 Hemiplegia and hemiparesis following cerebral infarction affecting right dominant side: Secondary | ICD-10-CM | POA: Diagnosis not present

## 2018-05-09 DIAGNOSIS — L89312 Pressure ulcer of right buttock, stage 2: Secondary | ICD-10-CM | POA: Diagnosis not present

## 2018-05-09 DIAGNOSIS — L89322 Pressure ulcer of left buttock, stage 2: Secondary | ICD-10-CM | POA: Diagnosis not present

## 2018-05-09 DIAGNOSIS — G3183 Dementia with Lewy bodies: Secondary | ICD-10-CM | POA: Diagnosis not present

## 2018-05-09 DIAGNOSIS — R1310 Dysphagia, unspecified: Secondary | ICD-10-CM | POA: Diagnosis not present

## 2018-05-11 DIAGNOSIS — L89322 Pressure ulcer of left buttock, stage 2: Secondary | ICD-10-CM | POA: Diagnosis not present

## 2018-05-11 DIAGNOSIS — I69351 Hemiplegia and hemiparesis following cerebral infarction affecting right dominant side: Secondary | ICD-10-CM | POA: Diagnosis not present

## 2018-05-11 DIAGNOSIS — G3183 Dementia with Lewy bodies: Secondary | ICD-10-CM | POA: Diagnosis not present

## 2018-05-11 DIAGNOSIS — R1310 Dysphagia, unspecified: Secondary | ICD-10-CM | POA: Diagnosis not present

## 2018-05-11 DIAGNOSIS — L89312 Pressure ulcer of right buttock, stage 2: Secondary | ICD-10-CM | POA: Diagnosis not present

## 2018-05-11 DIAGNOSIS — F0280 Dementia in other diseases classified elsewhere without behavioral disturbance: Secondary | ICD-10-CM | POA: Diagnosis not present

## 2018-05-12 DIAGNOSIS — L89322 Pressure ulcer of left buttock, stage 2: Secondary | ICD-10-CM | POA: Diagnosis not present

## 2018-05-12 DIAGNOSIS — F0280 Dementia in other diseases classified elsewhere without behavioral disturbance: Secondary | ICD-10-CM | POA: Diagnosis not present

## 2018-05-12 DIAGNOSIS — L89312 Pressure ulcer of right buttock, stage 2: Secondary | ICD-10-CM | POA: Diagnosis not present

## 2018-05-12 DIAGNOSIS — R1310 Dysphagia, unspecified: Secondary | ICD-10-CM | POA: Diagnosis not present

## 2018-05-12 DIAGNOSIS — G3183 Dementia with Lewy bodies: Secondary | ICD-10-CM | POA: Diagnosis not present

## 2018-05-12 DIAGNOSIS — I69351 Hemiplegia and hemiparesis following cerebral infarction affecting right dominant side: Secondary | ICD-10-CM | POA: Diagnosis not present

## 2018-05-13 DIAGNOSIS — L89312 Pressure ulcer of right buttock, stage 2: Secondary | ICD-10-CM | POA: Diagnosis not present

## 2018-05-13 DIAGNOSIS — R1310 Dysphagia, unspecified: Secondary | ICD-10-CM | POA: Diagnosis not present

## 2018-05-13 DIAGNOSIS — L89322 Pressure ulcer of left buttock, stage 2: Secondary | ICD-10-CM | POA: Diagnosis not present

## 2018-05-13 DIAGNOSIS — F0280 Dementia in other diseases classified elsewhere without behavioral disturbance: Secondary | ICD-10-CM | POA: Diagnosis not present

## 2018-05-13 DIAGNOSIS — G3183 Dementia with Lewy bodies: Secondary | ICD-10-CM | POA: Diagnosis not present

## 2018-05-13 DIAGNOSIS — I69351 Hemiplegia and hemiparesis following cerebral infarction affecting right dominant side: Secondary | ICD-10-CM | POA: Diagnosis not present

## 2018-05-16 DIAGNOSIS — R1310 Dysphagia, unspecified: Secondary | ICD-10-CM | POA: Diagnosis not present

## 2018-05-16 DIAGNOSIS — L89312 Pressure ulcer of right buttock, stage 2: Secondary | ICD-10-CM | POA: Diagnosis not present

## 2018-05-16 DIAGNOSIS — L89322 Pressure ulcer of left buttock, stage 2: Secondary | ICD-10-CM | POA: Diagnosis not present

## 2018-05-16 DIAGNOSIS — G3183 Dementia with Lewy bodies: Secondary | ICD-10-CM | POA: Diagnosis not present

## 2018-05-16 DIAGNOSIS — F0280 Dementia in other diseases classified elsewhere without behavioral disturbance: Secondary | ICD-10-CM | POA: Diagnosis not present

## 2018-05-16 DIAGNOSIS — I69351 Hemiplegia and hemiparesis following cerebral infarction affecting right dominant side: Secondary | ICD-10-CM | POA: Diagnosis not present

## 2018-05-18 DIAGNOSIS — I69351 Hemiplegia and hemiparesis following cerebral infarction affecting right dominant side: Secondary | ICD-10-CM | POA: Diagnosis not present

## 2018-05-18 DIAGNOSIS — G3183 Dementia with Lewy bodies: Secondary | ICD-10-CM | POA: Diagnosis not present

## 2018-05-18 DIAGNOSIS — L89312 Pressure ulcer of right buttock, stage 2: Secondary | ICD-10-CM | POA: Diagnosis not present

## 2018-05-18 DIAGNOSIS — L89322 Pressure ulcer of left buttock, stage 2: Secondary | ICD-10-CM | POA: Diagnosis not present

## 2018-05-18 DIAGNOSIS — F0280 Dementia in other diseases classified elsewhere without behavioral disturbance: Secondary | ICD-10-CM | POA: Diagnosis not present

## 2018-05-18 DIAGNOSIS — R1310 Dysphagia, unspecified: Secondary | ICD-10-CM | POA: Diagnosis not present

## 2018-05-19 DIAGNOSIS — F0280 Dementia in other diseases classified elsewhere without behavioral disturbance: Secondary | ICD-10-CM | POA: Diagnosis not present

## 2018-05-19 DIAGNOSIS — L89312 Pressure ulcer of right buttock, stage 2: Secondary | ICD-10-CM | POA: Diagnosis not present

## 2018-05-19 DIAGNOSIS — I69351 Hemiplegia and hemiparesis following cerebral infarction affecting right dominant side: Secondary | ICD-10-CM | POA: Diagnosis not present

## 2018-05-19 DIAGNOSIS — G3183 Dementia with Lewy bodies: Secondary | ICD-10-CM | POA: Diagnosis not present

## 2018-05-19 DIAGNOSIS — R1310 Dysphagia, unspecified: Secondary | ICD-10-CM | POA: Diagnosis not present

## 2018-05-19 DIAGNOSIS — L89322 Pressure ulcer of left buttock, stage 2: Secondary | ICD-10-CM | POA: Diagnosis not present

## 2018-05-20 DIAGNOSIS — I69351 Hemiplegia and hemiparesis following cerebral infarction affecting right dominant side: Secondary | ICD-10-CM | POA: Diagnosis not present

## 2018-05-20 DIAGNOSIS — F0280 Dementia in other diseases classified elsewhere without behavioral disturbance: Secondary | ICD-10-CM | POA: Diagnosis not present

## 2018-05-20 DIAGNOSIS — G3183 Dementia with Lewy bodies: Secondary | ICD-10-CM | POA: Diagnosis not present

## 2018-05-20 DIAGNOSIS — L89312 Pressure ulcer of right buttock, stage 2: Secondary | ICD-10-CM | POA: Diagnosis not present

## 2018-05-20 DIAGNOSIS — L89322 Pressure ulcer of left buttock, stage 2: Secondary | ICD-10-CM | POA: Diagnosis not present

## 2018-05-20 DIAGNOSIS — R1310 Dysphagia, unspecified: Secondary | ICD-10-CM | POA: Diagnosis not present

## 2018-05-23 DIAGNOSIS — G40209 Localization-related (focal) (partial) symptomatic epilepsy and epileptic syndromes with complex partial seizures, not intractable, without status epilepticus: Secondary | ICD-10-CM | POA: Diagnosis not present

## 2018-05-23 DIAGNOSIS — L89312 Pressure ulcer of right buttock, stage 2: Secondary | ICD-10-CM | POA: Diagnosis not present

## 2018-05-23 DIAGNOSIS — E119 Type 2 diabetes mellitus without complications: Secondary | ICD-10-CM | POA: Diagnosis not present

## 2018-05-23 DIAGNOSIS — I69351 Hemiplegia and hemiparesis following cerebral infarction affecting right dominant side: Secondary | ICD-10-CM | POA: Diagnosis not present

## 2018-05-23 DIAGNOSIS — F0280 Dementia in other diseases classified elsewhere without behavioral disturbance: Secondary | ICD-10-CM | POA: Diagnosis not present

## 2018-05-23 DIAGNOSIS — I10 Essential (primary) hypertension: Secondary | ICD-10-CM | POA: Diagnosis not present

## 2018-05-23 DIAGNOSIS — L89322 Pressure ulcer of left buttock, stage 2: Secondary | ICD-10-CM | POA: Diagnosis not present

## 2018-05-23 DIAGNOSIS — R1310 Dysphagia, unspecified: Secondary | ICD-10-CM | POA: Diagnosis not present

## 2018-05-23 DIAGNOSIS — Z87891 Personal history of nicotine dependence: Secondary | ICD-10-CM | POA: Diagnosis not present

## 2018-05-23 DIAGNOSIS — G3183 Dementia with Lewy bodies: Secondary | ICD-10-CM | POA: Diagnosis not present

## 2018-05-25 DIAGNOSIS — G3183 Dementia with Lewy bodies: Secondary | ICD-10-CM | POA: Diagnosis not present

## 2018-05-25 DIAGNOSIS — R1310 Dysphagia, unspecified: Secondary | ICD-10-CM | POA: Diagnosis not present

## 2018-05-25 DIAGNOSIS — I69351 Hemiplegia and hemiparesis following cerebral infarction affecting right dominant side: Secondary | ICD-10-CM | POA: Diagnosis not present

## 2018-05-25 DIAGNOSIS — L89312 Pressure ulcer of right buttock, stage 2: Secondary | ICD-10-CM | POA: Diagnosis not present

## 2018-05-25 DIAGNOSIS — F0280 Dementia in other diseases classified elsewhere without behavioral disturbance: Secondary | ICD-10-CM | POA: Diagnosis not present

## 2018-05-25 DIAGNOSIS — L89322 Pressure ulcer of left buttock, stage 2: Secondary | ICD-10-CM | POA: Diagnosis not present

## 2018-05-26 DIAGNOSIS — E1142 Type 2 diabetes mellitus with diabetic polyneuropathy: Secondary | ICD-10-CM | POA: Diagnosis not present

## 2018-05-26 DIAGNOSIS — M79605 Pain in left leg: Secondary | ICD-10-CM | POA: Diagnosis not present

## 2018-05-26 DIAGNOSIS — Z23 Encounter for immunization: Secondary | ICD-10-CM | POA: Diagnosis not present

## 2018-05-27 DIAGNOSIS — L89322 Pressure ulcer of left buttock, stage 2: Secondary | ICD-10-CM | POA: Diagnosis not present

## 2018-05-27 DIAGNOSIS — G3183 Dementia with Lewy bodies: Secondary | ICD-10-CM | POA: Diagnosis not present

## 2018-05-27 DIAGNOSIS — L89312 Pressure ulcer of right buttock, stage 2: Secondary | ICD-10-CM | POA: Diagnosis not present

## 2018-05-27 DIAGNOSIS — I69351 Hemiplegia and hemiparesis following cerebral infarction affecting right dominant side: Secondary | ICD-10-CM | POA: Diagnosis not present

## 2018-05-27 DIAGNOSIS — R1310 Dysphagia, unspecified: Secondary | ICD-10-CM | POA: Diagnosis not present

## 2018-05-27 DIAGNOSIS — F0280 Dementia in other diseases classified elsewhere without behavioral disturbance: Secondary | ICD-10-CM | POA: Diagnosis not present

## 2018-05-30 DIAGNOSIS — L89312 Pressure ulcer of right buttock, stage 2: Secondary | ICD-10-CM | POA: Diagnosis not present

## 2018-05-30 DIAGNOSIS — I69351 Hemiplegia and hemiparesis following cerebral infarction affecting right dominant side: Secondary | ICD-10-CM | POA: Diagnosis not present

## 2018-05-30 DIAGNOSIS — F0280 Dementia in other diseases classified elsewhere without behavioral disturbance: Secondary | ICD-10-CM | POA: Diagnosis not present

## 2018-05-30 DIAGNOSIS — G3183 Dementia with Lewy bodies: Secondary | ICD-10-CM | POA: Diagnosis not present

## 2018-05-30 DIAGNOSIS — R1310 Dysphagia, unspecified: Secondary | ICD-10-CM | POA: Diagnosis not present

## 2018-05-30 DIAGNOSIS — L89322 Pressure ulcer of left buttock, stage 2: Secondary | ICD-10-CM | POA: Diagnosis not present

## 2018-05-31 NOTE — Telephone Encounter (Signed)
, °

## 2018-06-01 DIAGNOSIS — F0280 Dementia in other diseases classified elsewhere without behavioral disturbance: Secondary | ICD-10-CM | POA: Diagnosis not present

## 2018-06-01 DIAGNOSIS — G3183 Dementia with Lewy bodies: Secondary | ICD-10-CM | POA: Diagnosis not present

## 2018-06-01 DIAGNOSIS — I69351 Hemiplegia and hemiparesis following cerebral infarction affecting right dominant side: Secondary | ICD-10-CM | POA: Diagnosis not present

## 2018-06-01 DIAGNOSIS — R1310 Dysphagia, unspecified: Secondary | ICD-10-CM | POA: Diagnosis not present

## 2018-06-01 DIAGNOSIS — L89322 Pressure ulcer of left buttock, stage 2: Secondary | ICD-10-CM | POA: Diagnosis not present

## 2018-06-01 DIAGNOSIS — L89312 Pressure ulcer of right buttock, stage 2: Secondary | ICD-10-CM | POA: Diagnosis not present

## 2018-06-02 DIAGNOSIS — R1310 Dysphagia, unspecified: Secondary | ICD-10-CM | POA: Diagnosis not present

## 2018-06-02 DIAGNOSIS — G3183 Dementia with Lewy bodies: Secondary | ICD-10-CM | POA: Diagnosis not present

## 2018-06-02 DIAGNOSIS — F0280 Dementia in other diseases classified elsewhere without behavioral disturbance: Secondary | ICD-10-CM | POA: Diagnosis not present

## 2018-06-02 DIAGNOSIS — L89312 Pressure ulcer of right buttock, stage 2: Secondary | ICD-10-CM | POA: Diagnosis not present

## 2018-06-02 DIAGNOSIS — L89322 Pressure ulcer of left buttock, stage 2: Secondary | ICD-10-CM | POA: Diagnosis not present

## 2018-06-02 DIAGNOSIS — I69351 Hemiplegia and hemiparesis following cerebral infarction affecting right dominant side: Secondary | ICD-10-CM | POA: Diagnosis not present

## 2018-06-03 DIAGNOSIS — G3183 Dementia with Lewy bodies: Secondary | ICD-10-CM | POA: Diagnosis not present

## 2018-06-03 DIAGNOSIS — I69351 Hemiplegia and hemiparesis following cerebral infarction affecting right dominant side: Secondary | ICD-10-CM | POA: Diagnosis not present

## 2018-06-03 DIAGNOSIS — L89322 Pressure ulcer of left buttock, stage 2: Secondary | ICD-10-CM | POA: Diagnosis not present

## 2018-06-03 DIAGNOSIS — L89312 Pressure ulcer of right buttock, stage 2: Secondary | ICD-10-CM | POA: Diagnosis not present

## 2018-06-03 DIAGNOSIS — R1310 Dysphagia, unspecified: Secondary | ICD-10-CM | POA: Diagnosis not present

## 2018-06-03 DIAGNOSIS — F0280 Dementia in other diseases classified elsewhere without behavioral disturbance: Secondary | ICD-10-CM | POA: Diagnosis not present

## 2018-06-06 DIAGNOSIS — I69351 Hemiplegia and hemiparesis following cerebral infarction affecting right dominant side: Secondary | ICD-10-CM | POA: Diagnosis not present

## 2018-06-06 DIAGNOSIS — F0280 Dementia in other diseases classified elsewhere without behavioral disturbance: Secondary | ICD-10-CM | POA: Diagnosis not present

## 2018-06-06 DIAGNOSIS — R1310 Dysphagia, unspecified: Secondary | ICD-10-CM | POA: Diagnosis not present

## 2018-06-06 DIAGNOSIS — L89312 Pressure ulcer of right buttock, stage 2: Secondary | ICD-10-CM | POA: Diagnosis not present

## 2018-06-06 DIAGNOSIS — G3183 Dementia with Lewy bodies: Secondary | ICD-10-CM | POA: Diagnosis not present

## 2018-06-06 DIAGNOSIS — L89322 Pressure ulcer of left buttock, stage 2: Secondary | ICD-10-CM | POA: Diagnosis not present

## 2018-06-08 DIAGNOSIS — L89322 Pressure ulcer of left buttock, stage 2: Secondary | ICD-10-CM | POA: Diagnosis not present

## 2018-06-08 DIAGNOSIS — G3183 Dementia with Lewy bodies: Secondary | ICD-10-CM | POA: Diagnosis not present

## 2018-06-08 DIAGNOSIS — I69351 Hemiplegia and hemiparesis following cerebral infarction affecting right dominant side: Secondary | ICD-10-CM | POA: Diagnosis not present

## 2018-06-08 DIAGNOSIS — F0280 Dementia in other diseases classified elsewhere without behavioral disturbance: Secondary | ICD-10-CM | POA: Diagnosis not present

## 2018-06-08 DIAGNOSIS — L89312 Pressure ulcer of right buttock, stage 2: Secondary | ICD-10-CM | POA: Diagnosis not present

## 2018-06-08 DIAGNOSIS — R1310 Dysphagia, unspecified: Secondary | ICD-10-CM | POA: Diagnosis not present

## 2018-06-09 DIAGNOSIS — I69351 Hemiplegia and hemiparesis following cerebral infarction affecting right dominant side: Secondary | ICD-10-CM | POA: Diagnosis not present

## 2018-06-09 DIAGNOSIS — L89322 Pressure ulcer of left buttock, stage 2: Secondary | ICD-10-CM | POA: Diagnosis not present

## 2018-06-09 DIAGNOSIS — R1310 Dysphagia, unspecified: Secondary | ICD-10-CM | POA: Diagnosis not present

## 2018-06-09 DIAGNOSIS — F0280 Dementia in other diseases classified elsewhere without behavioral disturbance: Secondary | ICD-10-CM | POA: Diagnosis not present

## 2018-06-09 DIAGNOSIS — L89312 Pressure ulcer of right buttock, stage 2: Secondary | ICD-10-CM | POA: Diagnosis not present

## 2018-06-09 DIAGNOSIS — G3183 Dementia with Lewy bodies: Secondary | ICD-10-CM | POA: Diagnosis not present

## 2018-06-10 DIAGNOSIS — F0280 Dementia in other diseases classified elsewhere without behavioral disturbance: Secondary | ICD-10-CM | POA: Diagnosis not present

## 2018-06-10 DIAGNOSIS — I69351 Hemiplegia and hemiparesis following cerebral infarction affecting right dominant side: Secondary | ICD-10-CM | POA: Diagnosis not present

## 2018-06-10 DIAGNOSIS — L89322 Pressure ulcer of left buttock, stage 2: Secondary | ICD-10-CM | POA: Diagnosis not present

## 2018-06-10 DIAGNOSIS — R1310 Dysphagia, unspecified: Secondary | ICD-10-CM | POA: Diagnosis not present

## 2018-06-10 DIAGNOSIS — L89312 Pressure ulcer of right buttock, stage 2: Secondary | ICD-10-CM | POA: Diagnosis not present

## 2018-06-10 DIAGNOSIS — G3183 Dementia with Lewy bodies: Secondary | ICD-10-CM | POA: Diagnosis not present

## 2018-06-13 DIAGNOSIS — L89322 Pressure ulcer of left buttock, stage 2: Secondary | ICD-10-CM | POA: Diagnosis not present

## 2018-06-13 DIAGNOSIS — I69351 Hemiplegia and hemiparesis following cerebral infarction affecting right dominant side: Secondary | ICD-10-CM | POA: Diagnosis not present

## 2018-06-13 DIAGNOSIS — F0280 Dementia in other diseases classified elsewhere without behavioral disturbance: Secondary | ICD-10-CM | POA: Diagnosis not present

## 2018-06-13 DIAGNOSIS — G3183 Dementia with Lewy bodies: Secondary | ICD-10-CM | POA: Diagnosis not present

## 2018-06-13 DIAGNOSIS — L89312 Pressure ulcer of right buttock, stage 2: Secondary | ICD-10-CM | POA: Diagnosis not present

## 2018-06-13 DIAGNOSIS — R1310 Dysphagia, unspecified: Secondary | ICD-10-CM | POA: Diagnosis not present

## 2018-06-15 DIAGNOSIS — R1310 Dysphagia, unspecified: Secondary | ICD-10-CM | POA: Diagnosis not present

## 2018-06-15 DIAGNOSIS — I69351 Hemiplegia and hemiparesis following cerebral infarction affecting right dominant side: Secondary | ICD-10-CM | POA: Diagnosis not present

## 2018-06-15 DIAGNOSIS — F0280 Dementia in other diseases classified elsewhere without behavioral disturbance: Secondary | ICD-10-CM | POA: Diagnosis not present

## 2018-06-15 DIAGNOSIS — G3183 Dementia with Lewy bodies: Secondary | ICD-10-CM | POA: Diagnosis not present

## 2018-06-15 DIAGNOSIS — L89322 Pressure ulcer of left buttock, stage 2: Secondary | ICD-10-CM | POA: Diagnosis not present

## 2018-06-15 DIAGNOSIS — L89312 Pressure ulcer of right buttock, stage 2: Secondary | ICD-10-CM | POA: Diagnosis not present

## 2018-06-16 DIAGNOSIS — F0280 Dementia in other diseases classified elsewhere without behavioral disturbance: Secondary | ICD-10-CM | POA: Diagnosis not present

## 2018-06-16 DIAGNOSIS — I69351 Hemiplegia and hemiparesis following cerebral infarction affecting right dominant side: Secondary | ICD-10-CM | POA: Diagnosis not present

## 2018-06-16 DIAGNOSIS — L89322 Pressure ulcer of left buttock, stage 2: Secondary | ICD-10-CM | POA: Diagnosis not present

## 2018-06-16 DIAGNOSIS — G3183 Dementia with Lewy bodies: Secondary | ICD-10-CM | POA: Diagnosis not present

## 2018-06-16 DIAGNOSIS — R1310 Dysphagia, unspecified: Secondary | ICD-10-CM | POA: Diagnosis not present

## 2018-06-16 DIAGNOSIS — L89312 Pressure ulcer of right buttock, stage 2: Secondary | ICD-10-CM | POA: Diagnosis not present

## 2018-06-17 DIAGNOSIS — R1310 Dysphagia, unspecified: Secondary | ICD-10-CM | POA: Diagnosis not present

## 2018-06-17 DIAGNOSIS — I69351 Hemiplegia and hemiparesis following cerebral infarction affecting right dominant side: Secondary | ICD-10-CM | POA: Diagnosis not present

## 2018-06-17 DIAGNOSIS — G3183 Dementia with Lewy bodies: Secondary | ICD-10-CM | POA: Diagnosis not present

## 2018-06-17 DIAGNOSIS — L89322 Pressure ulcer of left buttock, stage 2: Secondary | ICD-10-CM | POA: Diagnosis not present

## 2018-06-17 DIAGNOSIS — F0280 Dementia in other diseases classified elsewhere without behavioral disturbance: Secondary | ICD-10-CM | POA: Diagnosis not present

## 2018-06-17 DIAGNOSIS — L89312 Pressure ulcer of right buttock, stage 2: Secondary | ICD-10-CM | POA: Diagnosis not present

## 2018-06-20 DIAGNOSIS — I69351 Hemiplegia and hemiparesis following cerebral infarction affecting right dominant side: Secondary | ICD-10-CM | POA: Diagnosis not present

## 2018-06-20 DIAGNOSIS — R1310 Dysphagia, unspecified: Secondary | ICD-10-CM | POA: Diagnosis not present

## 2018-06-20 DIAGNOSIS — L89312 Pressure ulcer of right buttock, stage 2: Secondary | ICD-10-CM | POA: Diagnosis not present

## 2018-06-20 DIAGNOSIS — L89322 Pressure ulcer of left buttock, stage 2: Secondary | ICD-10-CM | POA: Diagnosis not present

## 2018-06-20 DIAGNOSIS — G3183 Dementia with Lewy bodies: Secondary | ICD-10-CM | POA: Diagnosis not present

## 2018-06-20 DIAGNOSIS — F0280 Dementia in other diseases classified elsewhere without behavioral disturbance: Secondary | ICD-10-CM | POA: Diagnosis not present

## 2018-06-22 DIAGNOSIS — I69351 Hemiplegia and hemiparesis following cerebral infarction affecting right dominant side: Secondary | ICD-10-CM | POA: Diagnosis not present

## 2018-06-22 DIAGNOSIS — F0280 Dementia in other diseases classified elsewhere without behavioral disturbance: Secondary | ICD-10-CM | POA: Diagnosis not present

## 2018-06-22 DIAGNOSIS — R1310 Dysphagia, unspecified: Secondary | ICD-10-CM | POA: Diagnosis not present

## 2018-06-22 DIAGNOSIS — G3183 Dementia with Lewy bodies: Secondary | ICD-10-CM | POA: Diagnosis not present

## 2018-06-22 DIAGNOSIS — L89322 Pressure ulcer of left buttock, stage 2: Secondary | ICD-10-CM | POA: Diagnosis not present

## 2018-06-22 DIAGNOSIS — L89312 Pressure ulcer of right buttock, stage 2: Secondary | ICD-10-CM | POA: Diagnosis not present

## 2018-06-23 DIAGNOSIS — F0280 Dementia in other diseases classified elsewhere without behavioral disturbance: Secondary | ICD-10-CM | POA: Diagnosis not present

## 2018-06-23 DIAGNOSIS — G40209 Localization-related (focal) (partial) symptomatic epilepsy and epileptic syndromes with complex partial seizures, not intractable, without status epilepticus: Secondary | ICD-10-CM | POA: Diagnosis not present

## 2018-06-23 DIAGNOSIS — E119 Type 2 diabetes mellitus without complications: Secondary | ICD-10-CM | POA: Diagnosis not present

## 2018-06-23 DIAGNOSIS — Z87891 Personal history of nicotine dependence: Secondary | ICD-10-CM | POA: Diagnosis not present

## 2018-06-23 DIAGNOSIS — L89322 Pressure ulcer of left buttock, stage 2: Secondary | ICD-10-CM | POA: Diagnosis not present

## 2018-06-23 DIAGNOSIS — I10 Essential (primary) hypertension: Secondary | ICD-10-CM | POA: Diagnosis not present

## 2018-06-23 DIAGNOSIS — I69351 Hemiplegia and hemiparesis following cerebral infarction affecting right dominant side: Secondary | ICD-10-CM | POA: Diagnosis not present

## 2018-06-23 DIAGNOSIS — G3183 Dementia with Lewy bodies: Secondary | ICD-10-CM | POA: Diagnosis not present

## 2018-06-23 DIAGNOSIS — R1310 Dysphagia, unspecified: Secondary | ICD-10-CM | POA: Diagnosis not present

## 2018-06-23 DIAGNOSIS — L89622 Pressure ulcer of left heel, stage 2: Secondary | ICD-10-CM | POA: Diagnosis not present

## 2018-06-23 DIAGNOSIS — L89312 Pressure ulcer of right buttock, stage 2: Secondary | ICD-10-CM | POA: Diagnosis not present

## 2018-06-24 DIAGNOSIS — I69351 Hemiplegia and hemiparesis following cerebral infarction affecting right dominant side: Secondary | ICD-10-CM | POA: Diagnosis not present

## 2018-06-24 DIAGNOSIS — L89622 Pressure ulcer of left heel, stage 2: Secondary | ICD-10-CM | POA: Diagnosis not present

## 2018-06-24 DIAGNOSIS — L89312 Pressure ulcer of right buttock, stage 2: Secondary | ICD-10-CM | POA: Diagnosis not present

## 2018-06-24 DIAGNOSIS — G3183 Dementia with Lewy bodies: Secondary | ICD-10-CM | POA: Diagnosis not present

## 2018-06-24 DIAGNOSIS — L89322 Pressure ulcer of left buttock, stage 2: Secondary | ICD-10-CM | POA: Diagnosis not present

## 2018-06-24 DIAGNOSIS — F0280 Dementia in other diseases classified elsewhere without behavioral disturbance: Secondary | ICD-10-CM | POA: Diagnosis not present

## 2018-06-27 DIAGNOSIS — L89312 Pressure ulcer of right buttock, stage 2: Secondary | ICD-10-CM | POA: Diagnosis not present

## 2018-06-27 DIAGNOSIS — I69351 Hemiplegia and hemiparesis following cerebral infarction affecting right dominant side: Secondary | ICD-10-CM | POA: Diagnosis not present

## 2018-06-27 DIAGNOSIS — L89322 Pressure ulcer of left buttock, stage 2: Secondary | ICD-10-CM | POA: Diagnosis not present

## 2018-06-27 DIAGNOSIS — L89622 Pressure ulcer of left heel, stage 2: Secondary | ICD-10-CM | POA: Diagnosis not present

## 2018-06-27 DIAGNOSIS — G3183 Dementia with Lewy bodies: Secondary | ICD-10-CM | POA: Diagnosis not present

## 2018-06-27 DIAGNOSIS — F0280 Dementia in other diseases classified elsewhere without behavioral disturbance: Secondary | ICD-10-CM | POA: Diagnosis not present

## 2018-06-29 DIAGNOSIS — L89622 Pressure ulcer of left heel, stage 2: Secondary | ICD-10-CM | POA: Diagnosis not present

## 2018-06-29 DIAGNOSIS — F0280 Dementia in other diseases classified elsewhere without behavioral disturbance: Secondary | ICD-10-CM | POA: Diagnosis not present

## 2018-06-29 DIAGNOSIS — L89312 Pressure ulcer of right buttock, stage 2: Secondary | ICD-10-CM | POA: Diagnosis not present

## 2018-06-29 DIAGNOSIS — L89322 Pressure ulcer of left buttock, stage 2: Secondary | ICD-10-CM | POA: Diagnosis not present

## 2018-06-29 DIAGNOSIS — G3183 Dementia with Lewy bodies: Secondary | ICD-10-CM | POA: Diagnosis not present

## 2018-06-29 DIAGNOSIS — I69351 Hemiplegia and hemiparesis following cerebral infarction affecting right dominant side: Secondary | ICD-10-CM | POA: Diagnosis not present

## 2018-06-30 DIAGNOSIS — L89622 Pressure ulcer of left heel, stage 2: Secondary | ICD-10-CM | POA: Diagnosis not present

## 2018-06-30 DIAGNOSIS — F0280 Dementia in other diseases classified elsewhere without behavioral disturbance: Secondary | ICD-10-CM | POA: Diagnosis not present

## 2018-06-30 DIAGNOSIS — L89312 Pressure ulcer of right buttock, stage 2: Secondary | ICD-10-CM | POA: Diagnosis not present

## 2018-06-30 DIAGNOSIS — I69351 Hemiplegia and hemiparesis following cerebral infarction affecting right dominant side: Secondary | ICD-10-CM | POA: Diagnosis not present

## 2018-06-30 DIAGNOSIS — G3183 Dementia with Lewy bodies: Secondary | ICD-10-CM | POA: Diagnosis not present

## 2018-06-30 DIAGNOSIS — L89322 Pressure ulcer of left buttock, stage 2: Secondary | ICD-10-CM | POA: Diagnosis not present

## 2018-07-01 DIAGNOSIS — L89312 Pressure ulcer of right buttock, stage 2: Secondary | ICD-10-CM | POA: Diagnosis not present

## 2018-07-01 DIAGNOSIS — F0280 Dementia in other diseases classified elsewhere without behavioral disturbance: Secondary | ICD-10-CM | POA: Diagnosis not present

## 2018-07-01 DIAGNOSIS — I69351 Hemiplegia and hemiparesis following cerebral infarction affecting right dominant side: Secondary | ICD-10-CM | POA: Diagnosis not present

## 2018-07-01 DIAGNOSIS — L89322 Pressure ulcer of left buttock, stage 2: Secondary | ICD-10-CM | POA: Diagnosis not present

## 2018-07-01 DIAGNOSIS — L89622 Pressure ulcer of left heel, stage 2: Secondary | ICD-10-CM | POA: Diagnosis not present

## 2018-07-01 DIAGNOSIS — G3183 Dementia with Lewy bodies: Secondary | ICD-10-CM | POA: Diagnosis not present

## 2018-07-04 DIAGNOSIS — L89312 Pressure ulcer of right buttock, stage 2: Secondary | ICD-10-CM | POA: Diagnosis not present

## 2018-07-04 DIAGNOSIS — F0280 Dementia in other diseases classified elsewhere without behavioral disturbance: Secondary | ICD-10-CM | POA: Diagnosis not present

## 2018-07-04 DIAGNOSIS — L89322 Pressure ulcer of left buttock, stage 2: Secondary | ICD-10-CM | POA: Diagnosis not present

## 2018-07-04 DIAGNOSIS — G3183 Dementia with Lewy bodies: Secondary | ICD-10-CM | POA: Diagnosis not present

## 2018-07-04 DIAGNOSIS — I69351 Hemiplegia and hemiparesis following cerebral infarction affecting right dominant side: Secondary | ICD-10-CM | POA: Diagnosis not present

## 2018-07-04 DIAGNOSIS — L89622 Pressure ulcer of left heel, stage 2: Secondary | ICD-10-CM | POA: Diagnosis not present

## 2018-07-06 DIAGNOSIS — F0280 Dementia in other diseases classified elsewhere without behavioral disturbance: Secondary | ICD-10-CM | POA: Diagnosis not present

## 2018-07-06 DIAGNOSIS — L89322 Pressure ulcer of left buttock, stage 2: Secondary | ICD-10-CM | POA: Diagnosis not present

## 2018-07-06 DIAGNOSIS — L89622 Pressure ulcer of left heel, stage 2: Secondary | ICD-10-CM | POA: Diagnosis not present

## 2018-07-06 DIAGNOSIS — G3183 Dementia with Lewy bodies: Secondary | ICD-10-CM | POA: Diagnosis not present

## 2018-07-06 DIAGNOSIS — L89312 Pressure ulcer of right buttock, stage 2: Secondary | ICD-10-CM | POA: Diagnosis not present

## 2018-07-06 DIAGNOSIS — I69351 Hemiplegia and hemiparesis following cerebral infarction affecting right dominant side: Secondary | ICD-10-CM | POA: Diagnosis not present

## 2018-07-07 DIAGNOSIS — L89622 Pressure ulcer of left heel, stage 2: Secondary | ICD-10-CM | POA: Diagnosis not present

## 2018-07-07 DIAGNOSIS — L89322 Pressure ulcer of left buttock, stage 2: Secondary | ICD-10-CM | POA: Diagnosis not present

## 2018-07-07 DIAGNOSIS — G3183 Dementia with Lewy bodies: Secondary | ICD-10-CM | POA: Diagnosis not present

## 2018-07-07 DIAGNOSIS — L89312 Pressure ulcer of right buttock, stage 2: Secondary | ICD-10-CM | POA: Diagnosis not present

## 2018-07-07 DIAGNOSIS — I69351 Hemiplegia and hemiparesis following cerebral infarction affecting right dominant side: Secondary | ICD-10-CM | POA: Diagnosis not present

## 2018-07-07 DIAGNOSIS — F0280 Dementia in other diseases classified elsewhere without behavioral disturbance: Secondary | ICD-10-CM | POA: Diagnosis not present

## 2018-07-08 DIAGNOSIS — G3183 Dementia with Lewy bodies: Secondary | ICD-10-CM | POA: Diagnosis not present

## 2018-07-08 DIAGNOSIS — L89312 Pressure ulcer of right buttock, stage 2: Secondary | ICD-10-CM | POA: Diagnosis not present

## 2018-07-08 DIAGNOSIS — I69351 Hemiplegia and hemiparesis following cerebral infarction affecting right dominant side: Secondary | ICD-10-CM | POA: Diagnosis not present

## 2018-07-08 DIAGNOSIS — L89622 Pressure ulcer of left heel, stage 2: Secondary | ICD-10-CM | POA: Diagnosis not present

## 2018-07-08 DIAGNOSIS — F0280 Dementia in other diseases classified elsewhere without behavioral disturbance: Secondary | ICD-10-CM | POA: Diagnosis not present

## 2018-07-08 DIAGNOSIS — L89322 Pressure ulcer of left buttock, stage 2: Secondary | ICD-10-CM | POA: Diagnosis not present

## 2018-07-11 DIAGNOSIS — L89622 Pressure ulcer of left heel, stage 2: Secondary | ICD-10-CM | POA: Diagnosis not present

## 2018-07-11 DIAGNOSIS — I69351 Hemiplegia and hemiparesis following cerebral infarction affecting right dominant side: Secondary | ICD-10-CM | POA: Diagnosis not present

## 2018-07-11 DIAGNOSIS — L89322 Pressure ulcer of left buttock, stage 2: Secondary | ICD-10-CM | POA: Diagnosis not present

## 2018-07-11 DIAGNOSIS — L89312 Pressure ulcer of right buttock, stage 2: Secondary | ICD-10-CM | POA: Diagnosis not present

## 2018-07-11 DIAGNOSIS — F0280 Dementia in other diseases classified elsewhere without behavioral disturbance: Secondary | ICD-10-CM | POA: Diagnosis not present

## 2018-07-11 DIAGNOSIS — G3183 Dementia with Lewy bodies: Secondary | ICD-10-CM | POA: Diagnosis not present

## 2018-07-13 DIAGNOSIS — I69351 Hemiplegia and hemiparesis following cerebral infarction affecting right dominant side: Secondary | ICD-10-CM | POA: Diagnosis not present

## 2018-07-13 DIAGNOSIS — F0280 Dementia in other diseases classified elsewhere without behavioral disturbance: Secondary | ICD-10-CM | POA: Diagnosis not present

## 2018-07-13 DIAGNOSIS — G3183 Dementia with Lewy bodies: Secondary | ICD-10-CM | POA: Diagnosis not present

## 2018-07-13 DIAGNOSIS — L89312 Pressure ulcer of right buttock, stage 2: Secondary | ICD-10-CM | POA: Diagnosis not present

## 2018-07-13 DIAGNOSIS — L89622 Pressure ulcer of left heel, stage 2: Secondary | ICD-10-CM | POA: Diagnosis not present

## 2018-07-13 DIAGNOSIS — L89322 Pressure ulcer of left buttock, stage 2: Secondary | ICD-10-CM | POA: Diagnosis not present

## 2018-07-14 DIAGNOSIS — L89312 Pressure ulcer of right buttock, stage 2: Secondary | ICD-10-CM | POA: Diagnosis not present

## 2018-07-14 DIAGNOSIS — G3183 Dementia with Lewy bodies: Secondary | ICD-10-CM | POA: Diagnosis not present

## 2018-07-14 DIAGNOSIS — L89322 Pressure ulcer of left buttock, stage 2: Secondary | ICD-10-CM | POA: Diagnosis not present

## 2018-07-14 DIAGNOSIS — L89622 Pressure ulcer of left heel, stage 2: Secondary | ICD-10-CM | POA: Diagnosis not present

## 2018-07-14 DIAGNOSIS — F0280 Dementia in other diseases classified elsewhere without behavioral disturbance: Secondary | ICD-10-CM | POA: Diagnosis not present

## 2018-07-14 DIAGNOSIS — I69351 Hemiplegia and hemiparesis following cerebral infarction affecting right dominant side: Secondary | ICD-10-CM | POA: Diagnosis not present

## 2018-07-15 DIAGNOSIS — I69351 Hemiplegia and hemiparesis following cerebral infarction affecting right dominant side: Secondary | ICD-10-CM | POA: Diagnosis not present

## 2018-07-15 DIAGNOSIS — L89622 Pressure ulcer of left heel, stage 2: Secondary | ICD-10-CM | POA: Diagnosis not present

## 2018-07-15 DIAGNOSIS — F0280 Dementia in other diseases classified elsewhere without behavioral disturbance: Secondary | ICD-10-CM | POA: Diagnosis not present

## 2018-07-15 DIAGNOSIS — L89312 Pressure ulcer of right buttock, stage 2: Secondary | ICD-10-CM | POA: Diagnosis not present

## 2018-07-15 DIAGNOSIS — G3183 Dementia with Lewy bodies: Secondary | ICD-10-CM | POA: Diagnosis not present

## 2018-07-15 DIAGNOSIS — L89322 Pressure ulcer of left buttock, stage 2: Secondary | ICD-10-CM | POA: Diagnosis not present

## 2018-07-18 DIAGNOSIS — I69351 Hemiplegia and hemiparesis following cerebral infarction affecting right dominant side: Secondary | ICD-10-CM | POA: Diagnosis not present

## 2018-07-18 DIAGNOSIS — L89312 Pressure ulcer of right buttock, stage 2: Secondary | ICD-10-CM | POA: Diagnosis not present

## 2018-07-18 DIAGNOSIS — F0280 Dementia in other diseases classified elsewhere without behavioral disturbance: Secondary | ICD-10-CM | POA: Diagnosis not present

## 2018-07-18 DIAGNOSIS — L89322 Pressure ulcer of left buttock, stage 2: Secondary | ICD-10-CM | POA: Diagnosis not present

## 2018-07-18 DIAGNOSIS — G3183 Dementia with Lewy bodies: Secondary | ICD-10-CM | POA: Diagnosis not present

## 2018-07-18 DIAGNOSIS — L89622 Pressure ulcer of left heel, stage 2: Secondary | ICD-10-CM | POA: Diagnosis not present

## 2018-07-21 DIAGNOSIS — F0280 Dementia in other diseases classified elsewhere without behavioral disturbance: Secondary | ICD-10-CM | POA: Diagnosis not present

## 2018-07-21 DIAGNOSIS — L89312 Pressure ulcer of right buttock, stage 2: Secondary | ICD-10-CM | POA: Diagnosis not present

## 2018-07-21 DIAGNOSIS — G3183 Dementia with Lewy bodies: Secondary | ICD-10-CM | POA: Diagnosis not present

## 2018-07-21 DIAGNOSIS — L89322 Pressure ulcer of left buttock, stage 2: Secondary | ICD-10-CM | POA: Diagnosis not present

## 2018-07-21 DIAGNOSIS — I69351 Hemiplegia and hemiparesis following cerebral infarction affecting right dominant side: Secondary | ICD-10-CM | POA: Diagnosis not present

## 2018-07-21 DIAGNOSIS — L89622 Pressure ulcer of left heel, stage 2: Secondary | ICD-10-CM | POA: Diagnosis not present

## 2018-07-22 DIAGNOSIS — G3183 Dementia with Lewy bodies: Secondary | ICD-10-CM | POA: Diagnosis not present

## 2018-07-22 DIAGNOSIS — F0280 Dementia in other diseases classified elsewhere without behavioral disturbance: Secondary | ICD-10-CM | POA: Diagnosis not present

## 2018-07-22 DIAGNOSIS — L89622 Pressure ulcer of left heel, stage 2: Secondary | ICD-10-CM | POA: Diagnosis not present

## 2018-07-22 DIAGNOSIS — L89312 Pressure ulcer of right buttock, stage 2: Secondary | ICD-10-CM | POA: Diagnosis not present

## 2018-07-22 DIAGNOSIS — L89322 Pressure ulcer of left buttock, stage 2: Secondary | ICD-10-CM | POA: Diagnosis not present

## 2018-07-22 DIAGNOSIS — I69351 Hemiplegia and hemiparesis following cerebral infarction affecting right dominant side: Secondary | ICD-10-CM | POA: Diagnosis not present

## 2018-07-23 DIAGNOSIS — F0280 Dementia in other diseases classified elsewhere without behavioral disturbance: Secondary | ICD-10-CM | POA: Diagnosis not present

## 2018-07-23 DIAGNOSIS — I69351 Hemiplegia and hemiparesis following cerebral infarction affecting right dominant side: Secondary | ICD-10-CM | POA: Diagnosis not present

## 2018-07-23 DIAGNOSIS — I10 Essential (primary) hypertension: Secondary | ICD-10-CM | POA: Diagnosis not present

## 2018-07-23 DIAGNOSIS — G40209 Localization-related (focal) (partial) symptomatic epilepsy and epileptic syndromes with complex partial seizures, not intractable, without status epilepticus: Secondary | ICD-10-CM | POA: Diagnosis not present

## 2018-07-23 DIAGNOSIS — R1310 Dysphagia, unspecified: Secondary | ICD-10-CM | POA: Diagnosis not present

## 2018-07-23 DIAGNOSIS — L89312 Pressure ulcer of right buttock, stage 2: Secondary | ICD-10-CM | POA: Diagnosis not present

## 2018-07-23 DIAGNOSIS — L89322 Pressure ulcer of left buttock, stage 2: Secondary | ICD-10-CM | POA: Diagnosis not present

## 2018-07-23 DIAGNOSIS — E119 Type 2 diabetes mellitus without complications: Secondary | ICD-10-CM | POA: Diagnosis not present

## 2018-07-23 DIAGNOSIS — L89622 Pressure ulcer of left heel, stage 2: Secondary | ICD-10-CM | POA: Diagnosis not present

## 2018-07-23 DIAGNOSIS — G3183 Dementia with Lewy bodies: Secondary | ICD-10-CM | POA: Diagnosis not present

## 2018-07-23 DIAGNOSIS — Z87891 Personal history of nicotine dependence: Secondary | ICD-10-CM | POA: Diagnosis not present

## 2018-07-25 DIAGNOSIS — L89322 Pressure ulcer of left buttock, stage 2: Secondary | ICD-10-CM | POA: Diagnosis not present

## 2018-07-25 DIAGNOSIS — L89312 Pressure ulcer of right buttock, stage 2: Secondary | ICD-10-CM | POA: Diagnosis not present

## 2018-07-25 DIAGNOSIS — I69351 Hemiplegia and hemiparesis following cerebral infarction affecting right dominant side: Secondary | ICD-10-CM | POA: Diagnosis not present

## 2018-07-25 DIAGNOSIS — F0280 Dementia in other diseases classified elsewhere without behavioral disturbance: Secondary | ICD-10-CM | POA: Diagnosis not present

## 2018-07-25 DIAGNOSIS — L89622 Pressure ulcer of left heel, stage 2: Secondary | ICD-10-CM | POA: Diagnosis not present

## 2018-07-25 DIAGNOSIS — G3183 Dementia with Lewy bodies: Secondary | ICD-10-CM | POA: Diagnosis not present

## 2018-07-27 DIAGNOSIS — L89322 Pressure ulcer of left buttock, stage 2: Secondary | ICD-10-CM | POA: Diagnosis not present

## 2018-07-27 DIAGNOSIS — G3183 Dementia with Lewy bodies: Secondary | ICD-10-CM | POA: Diagnosis not present

## 2018-07-27 DIAGNOSIS — L89622 Pressure ulcer of left heel, stage 2: Secondary | ICD-10-CM | POA: Diagnosis not present

## 2018-07-27 DIAGNOSIS — I69351 Hemiplegia and hemiparesis following cerebral infarction affecting right dominant side: Secondary | ICD-10-CM | POA: Diagnosis not present

## 2018-07-27 DIAGNOSIS — F0280 Dementia in other diseases classified elsewhere without behavioral disturbance: Secondary | ICD-10-CM | POA: Diagnosis not present

## 2018-07-27 DIAGNOSIS — L89312 Pressure ulcer of right buttock, stage 2: Secondary | ICD-10-CM | POA: Diagnosis not present

## 2018-07-28 DIAGNOSIS — L89312 Pressure ulcer of right buttock, stage 2: Secondary | ICD-10-CM | POA: Diagnosis not present

## 2018-07-28 DIAGNOSIS — I69351 Hemiplegia and hemiparesis following cerebral infarction affecting right dominant side: Secondary | ICD-10-CM | POA: Diagnosis not present

## 2018-07-28 DIAGNOSIS — L89322 Pressure ulcer of left buttock, stage 2: Secondary | ICD-10-CM | POA: Diagnosis not present

## 2018-07-28 DIAGNOSIS — F0280 Dementia in other diseases classified elsewhere without behavioral disturbance: Secondary | ICD-10-CM | POA: Diagnosis not present

## 2018-07-28 DIAGNOSIS — G3183 Dementia with Lewy bodies: Secondary | ICD-10-CM | POA: Diagnosis not present

## 2018-07-28 DIAGNOSIS — L89622 Pressure ulcer of left heel, stage 2: Secondary | ICD-10-CM | POA: Diagnosis not present

## 2018-07-29 DIAGNOSIS — G3183 Dementia with Lewy bodies: Secondary | ICD-10-CM | POA: Diagnosis not present

## 2018-07-29 DIAGNOSIS — I69351 Hemiplegia and hemiparesis following cerebral infarction affecting right dominant side: Secondary | ICD-10-CM | POA: Diagnosis not present

## 2018-07-29 DIAGNOSIS — L89322 Pressure ulcer of left buttock, stage 2: Secondary | ICD-10-CM | POA: Diagnosis not present

## 2018-07-29 DIAGNOSIS — L89622 Pressure ulcer of left heel, stage 2: Secondary | ICD-10-CM | POA: Diagnosis not present

## 2018-07-29 DIAGNOSIS — L89312 Pressure ulcer of right buttock, stage 2: Secondary | ICD-10-CM | POA: Diagnosis not present

## 2018-07-29 DIAGNOSIS — F0280 Dementia in other diseases classified elsewhere without behavioral disturbance: Secondary | ICD-10-CM | POA: Diagnosis not present

## 2018-08-01 DIAGNOSIS — L89312 Pressure ulcer of right buttock, stage 2: Secondary | ICD-10-CM | POA: Diagnosis not present

## 2018-08-01 DIAGNOSIS — L89622 Pressure ulcer of left heel, stage 2: Secondary | ICD-10-CM | POA: Diagnosis not present

## 2018-08-01 DIAGNOSIS — L89322 Pressure ulcer of left buttock, stage 2: Secondary | ICD-10-CM | POA: Diagnosis not present

## 2018-08-01 DIAGNOSIS — I69351 Hemiplegia and hemiparesis following cerebral infarction affecting right dominant side: Secondary | ICD-10-CM | POA: Diagnosis not present

## 2018-08-01 DIAGNOSIS — G3183 Dementia with Lewy bodies: Secondary | ICD-10-CM | POA: Diagnosis not present

## 2018-08-01 DIAGNOSIS — F0280 Dementia in other diseases classified elsewhere without behavioral disturbance: Secondary | ICD-10-CM | POA: Diagnosis not present

## 2018-08-03 DIAGNOSIS — L89322 Pressure ulcer of left buttock, stage 2: Secondary | ICD-10-CM | POA: Diagnosis not present

## 2018-08-03 DIAGNOSIS — F0280 Dementia in other diseases classified elsewhere without behavioral disturbance: Secondary | ICD-10-CM | POA: Diagnosis not present

## 2018-08-03 DIAGNOSIS — G3183 Dementia with Lewy bodies: Secondary | ICD-10-CM | POA: Diagnosis not present

## 2018-08-03 DIAGNOSIS — L89622 Pressure ulcer of left heel, stage 2: Secondary | ICD-10-CM | POA: Diagnosis not present

## 2018-08-03 DIAGNOSIS — L89312 Pressure ulcer of right buttock, stage 2: Secondary | ICD-10-CM | POA: Diagnosis not present

## 2018-08-03 DIAGNOSIS — I69351 Hemiplegia and hemiparesis following cerebral infarction affecting right dominant side: Secondary | ICD-10-CM | POA: Diagnosis not present

## 2018-08-04 DIAGNOSIS — L89322 Pressure ulcer of left buttock, stage 2: Secondary | ICD-10-CM | POA: Diagnosis not present

## 2018-08-04 DIAGNOSIS — I69351 Hemiplegia and hemiparesis following cerebral infarction affecting right dominant side: Secondary | ICD-10-CM | POA: Diagnosis not present

## 2018-08-04 DIAGNOSIS — L89622 Pressure ulcer of left heel, stage 2: Secondary | ICD-10-CM | POA: Diagnosis not present

## 2018-08-04 DIAGNOSIS — G3183 Dementia with Lewy bodies: Secondary | ICD-10-CM | POA: Diagnosis not present

## 2018-08-04 DIAGNOSIS — L89312 Pressure ulcer of right buttock, stage 2: Secondary | ICD-10-CM | POA: Diagnosis not present

## 2018-08-04 DIAGNOSIS — F0280 Dementia in other diseases classified elsewhere without behavioral disturbance: Secondary | ICD-10-CM | POA: Diagnosis not present

## 2018-08-05 DIAGNOSIS — L89312 Pressure ulcer of right buttock, stage 2: Secondary | ICD-10-CM | POA: Diagnosis not present

## 2018-08-05 DIAGNOSIS — L89322 Pressure ulcer of left buttock, stage 2: Secondary | ICD-10-CM | POA: Diagnosis not present

## 2018-08-05 DIAGNOSIS — G3183 Dementia with Lewy bodies: Secondary | ICD-10-CM | POA: Diagnosis not present

## 2018-08-05 DIAGNOSIS — L89622 Pressure ulcer of left heel, stage 2: Secondary | ICD-10-CM | POA: Diagnosis not present

## 2018-08-05 DIAGNOSIS — I69351 Hemiplegia and hemiparesis following cerebral infarction affecting right dominant side: Secondary | ICD-10-CM | POA: Diagnosis not present

## 2018-08-05 DIAGNOSIS — F0280 Dementia in other diseases classified elsewhere without behavioral disturbance: Secondary | ICD-10-CM | POA: Diagnosis not present

## 2018-08-08 DIAGNOSIS — F0280 Dementia in other diseases classified elsewhere without behavioral disturbance: Secondary | ICD-10-CM | POA: Diagnosis not present

## 2018-08-08 DIAGNOSIS — L89312 Pressure ulcer of right buttock, stage 2: Secondary | ICD-10-CM | POA: Diagnosis not present

## 2018-08-08 DIAGNOSIS — I69351 Hemiplegia and hemiparesis following cerebral infarction affecting right dominant side: Secondary | ICD-10-CM | POA: Diagnosis not present

## 2018-08-08 DIAGNOSIS — G3183 Dementia with Lewy bodies: Secondary | ICD-10-CM | POA: Diagnosis not present

## 2018-08-08 DIAGNOSIS — L89622 Pressure ulcer of left heel, stage 2: Secondary | ICD-10-CM | POA: Diagnosis not present

## 2018-08-08 DIAGNOSIS — L89322 Pressure ulcer of left buttock, stage 2: Secondary | ICD-10-CM | POA: Diagnosis not present

## 2018-08-10 DIAGNOSIS — G3183 Dementia with Lewy bodies: Secondary | ICD-10-CM | POA: Diagnosis not present

## 2018-08-10 DIAGNOSIS — F0280 Dementia in other diseases classified elsewhere without behavioral disturbance: Secondary | ICD-10-CM | POA: Diagnosis not present

## 2018-08-10 DIAGNOSIS — I69351 Hemiplegia and hemiparesis following cerebral infarction affecting right dominant side: Secondary | ICD-10-CM | POA: Diagnosis not present

## 2018-08-10 DIAGNOSIS — L89322 Pressure ulcer of left buttock, stage 2: Secondary | ICD-10-CM | POA: Diagnosis not present

## 2018-08-10 DIAGNOSIS — L89622 Pressure ulcer of left heel, stage 2: Secondary | ICD-10-CM | POA: Diagnosis not present

## 2018-08-10 DIAGNOSIS — L89312 Pressure ulcer of right buttock, stage 2: Secondary | ICD-10-CM | POA: Diagnosis not present

## 2018-08-11 DIAGNOSIS — L89622 Pressure ulcer of left heel, stage 2: Secondary | ICD-10-CM | POA: Diagnosis not present

## 2018-08-11 DIAGNOSIS — L89312 Pressure ulcer of right buttock, stage 2: Secondary | ICD-10-CM | POA: Diagnosis not present

## 2018-08-11 DIAGNOSIS — G3183 Dementia with Lewy bodies: Secondary | ICD-10-CM | POA: Diagnosis not present

## 2018-08-11 DIAGNOSIS — F0280 Dementia in other diseases classified elsewhere without behavioral disturbance: Secondary | ICD-10-CM | POA: Diagnosis not present

## 2018-08-11 DIAGNOSIS — L89322 Pressure ulcer of left buttock, stage 2: Secondary | ICD-10-CM | POA: Diagnosis not present

## 2018-08-11 DIAGNOSIS — I69351 Hemiplegia and hemiparesis following cerebral infarction affecting right dominant side: Secondary | ICD-10-CM | POA: Diagnosis not present

## 2018-08-12 DIAGNOSIS — L89322 Pressure ulcer of left buttock, stage 2: Secondary | ICD-10-CM | POA: Diagnosis not present

## 2018-08-12 DIAGNOSIS — L89622 Pressure ulcer of left heel, stage 2: Secondary | ICD-10-CM | POA: Diagnosis not present

## 2018-08-12 DIAGNOSIS — F0280 Dementia in other diseases classified elsewhere without behavioral disturbance: Secondary | ICD-10-CM | POA: Diagnosis not present

## 2018-08-12 DIAGNOSIS — L89312 Pressure ulcer of right buttock, stage 2: Secondary | ICD-10-CM | POA: Diagnosis not present

## 2018-08-12 DIAGNOSIS — G3183 Dementia with Lewy bodies: Secondary | ICD-10-CM | POA: Diagnosis not present

## 2018-08-12 DIAGNOSIS — I69351 Hemiplegia and hemiparesis following cerebral infarction affecting right dominant side: Secondary | ICD-10-CM | POA: Diagnosis not present

## 2018-08-15 DIAGNOSIS — G3183 Dementia with Lewy bodies: Secondary | ICD-10-CM | POA: Diagnosis not present

## 2018-08-15 DIAGNOSIS — L89322 Pressure ulcer of left buttock, stage 2: Secondary | ICD-10-CM | POA: Diagnosis not present

## 2018-08-15 DIAGNOSIS — L89622 Pressure ulcer of left heel, stage 2: Secondary | ICD-10-CM | POA: Diagnosis not present

## 2018-08-15 DIAGNOSIS — L89312 Pressure ulcer of right buttock, stage 2: Secondary | ICD-10-CM | POA: Diagnosis not present

## 2018-08-15 DIAGNOSIS — F0280 Dementia in other diseases classified elsewhere without behavioral disturbance: Secondary | ICD-10-CM | POA: Diagnosis not present

## 2018-08-15 DIAGNOSIS — I69351 Hemiplegia and hemiparesis following cerebral infarction affecting right dominant side: Secondary | ICD-10-CM | POA: Diagnosis not present

## 2018-08-17 DIAGNOSIS — L89622 Pressure ulcer of left heel, stage 2: Secondary | ICD-10-CM | POA: Diagnosis not present

## 2018-08-17 DIAGNOSIS — I69351 Hemiplegia and hemiparesis following cerebral infarction affecting right dominant side: Secondary | ICD-10-CM | POA: Diagnosis not present

## 2018-08-17 DIAGNOSIS — G3183 Dementia with Lewy bodies: Secondary | ICD-10-CM | POA: Diagnosis not present

## 2018-08-17 DIAGNOSIS — L89322 Pressure ulcer of left buttock, stage 2: Secondary | ICD-10-CM | POA: Diagnosis not present

## 2018-08-17 DIAGNOSIS — F0280 Dementia in other diseases classified elsewhere without behavioral disturbance: Secondary | ICD-10-CM | POA: Diagnosis not present

## 2018-08-17 DIAGNOSIS — L89312 Pressure ulcer of right buttock, stage 2: Secondary | ICD-10-CM | POA: Diagnosis not present

## 2018-08-18 DIAGNOSIS — L89312 Pressure ulcer of right buttock, stage 2: Secondary | ICD-10-CM | POA: Diagnosis not present

## 2018-08-18 DIAGNOSIS — I69351 Hemiplegia and hemiparesis following cerebral infarction affecting right dominant side: Secondary | ICD-10-CM | POA: Diagnosis not present

## 2018-08-18 DIAGNOSIS — L89622 Pressure ulcer of left heel, stage 2: Secondary | ICD-10-CM | POA: Diagnosis not present

## 2018-08-18 DIAGNOSIS — G3183 Dementia with Lewy bodies: Secondary | ICD-10-CM | POA: Diagnosis not present

## 2018-08-18 DIAGNOSIS — L89322 Pressure ulcer of left buttock, stage 2: Secondary | ICD-10-CM | POA: Diagnosis not present

## 2018-08-18 DIAGNOSIS — F0280 Dementia in other diseases classified elsewhere without behavioral disturbance: Secondary | ICD-10-CM | POA: Diagnosis not present

## 2018-08-19 DIAGNOSIS — L89322 Pressure ulcer of left buttock, stage 2: Secondary | ICD-10-CM | POA: Diagnosis not present

## 2018-08-19 DIAGNOSIS — I69351 Hemiplegia and hemiparesis following cerebral infarction affecting right dominant side: Secondary | ICD-10-CM | POA: Diagnosis not present

## 2018-08-19 DIAGNOSIS — L89622 Pressure ulcer of left heel, stage 2: Secondary | ICD-10-CM | POA: Diagnosis not present

## 2018-08-19 DIAGNOSIS — F0280 Dementia in other diseases classified elsewhere without behavioral disturbance: Secondary | ICD-10-CM | POA: Diagnosis not present

## 2018-08-19 DIAGNOSIS — L89312 Pressure ulcer of right buttock, stage 2: Secondary | ICD-10-CM | POA: Diagnosis not present

## 2018-08-19 DIAGNOSIS — G3183 Dementia with Lewy bodies: Secondary | ICD-10-CM | POA: Diagnosis not present

## 2018-08-22 DIAGNOSIS — G3183 Dementia with Lewy bodies: Secondary | ICD-10-CM | POA: Diagnosis not present

## 2018-08-22 DIAGNOSIS — L89322 Pressure ulcer of left buttock, stage 2: Secondary | ICD-10-CM | POA: Diagnosis not present

## 2018-08-22 DIAGNOSIS — F0280 Dementia in other diseases classified elsewhere without behavioral disturbance: Secondary | ICD-10-CM | POA: Diagnosis not present

## 2018-08-22 DIAGNOSIS — L89312 Pressure ulcer of right buttock, stage 2: Secondary | ICD-10-CM | POA: Diagnosis not present

## 2018-08-22 DIAGNOSIS — L89622 Pressure ulcer of left heel, stage 2: Secondary | ICD-10-CM | POA: Diagnosis not present

## 2018-08-22 DIAGNOSIS — I69351 Hemiplegia and hemiparesis following cerebral infarction affecting right dominant side: Secondary | ICD-10-CM | POA: Diagnosis not present

## 2018-08-23 DIAGNOSIS — G3183 Dementia with Lewy bodies: Secondary | ICD-10-CM | POA: Diagnosis not present

## 2018-08-23 DIAGNOSIS — I10 Essential (primary) hypertension: Secondary | ICD-10-CM | POA: Diagnosis not present

## 2018-08-23 DIAGNOSIS — R1310 Dysphagia, unspecified: Secondary | ICD-10-CM | POA: Diagnosis not present

## 2018-08-23 DIAGNOSIS — L89622 Pressure ulcer of left heel, stage 2: Secondary | ICD-10-CM | POA: Diagnosis not present

## 2018-08-23 DIAGNOSIS — I69351 Hemiplegia and hemiparesis following cerebral infarction affecting right dominant side: Secondary | ICD-10-CM | POA: Diagnosis not present

## 2018-08-23 DIAGNOSIS — F0280 Dementia in other diseases classified elsewhere without behavioral disturbance: Secondary | ICD-10-CM | POA: Diagnosis not present

## 2018-08-23 DIAGNOSIS — L89322 Pressure ulcer of left buttock, stage 2: Secondary | ICD-10-CM | POA: Diagnosis not present

## 2018-08-23 DIAGNOSIS — L89312 Pressure ulcer of right buttock, stage 2: Secondary | ICD-10-CM | POA: Diagnosis not present

## 2018-08-23 DIAGNOSIS — G40209 Localization-related (focal) (partial) symptomatic epilepsy and epileptic syndromes with complex partial seizures, not intractable, without status epilepticus: Secondary | ICD-10-CM | POA: Diagnosis not present

## 2018-08-23 DIAGNOSIS — E119 Type 2 diabetes mellitus without complications: Secondary | ICD-10-CM | POA: Diagnosis not present

## 2018-08-23 DIAGNOSIS — Z87891 Personal history of nicotine dependence: Secondary | ICD-10-CM | POA: Diagnosis not present

## 2018-08-24 DIAGNOSIS — F0280 Dementia in other diseases classified elsewhere without behavioral disturbance: Secondary | ICD-10-CM | POA: Diagnosis not present

## 2018-08-24 DIAGNOSIS — I69351 Hemiplegia and hemiparesis following cerebral infarction affecting right dominant side: Secondary | ICD-10-CM | POA: Diagnosis not present

## 2018-08-24 DIAGNOSIS — L89322 Pressure ulcer of left buttock, stage 2: Secondary | ICD-10-CM | POA: Diagnosis not present

## 2018-08-24 DIAGNOSIS — R1310 Dysphagia, unspecified: Secondary | ICD-10-CM | POA: Diagnosis not present

## 2018-08-24 DIAGNOSIS — L89312 Pressure ulcer of right buttock, stage 2: Secondary | ICD-10-CM | POA: Diagnosis not present

## 2018-08-24 DIAGNOSIS — G3183 Dementia with Lewy bodies: Secondary | ICD-10-CM | POA: Diagnosis not present

## 2018-08-25 DIAGNOSIS — L89312 Pressure ulcer of right buttock, stage 2: Secondary | ICD-10-CM | POA: Diagnosis not present

## 2018-08-25 DIAGNOSIS — I69351 Hemiplegia and hemiparesis following cerebral infarction affecting right dominant side: Secondary | ICD-10-CM | POA: Diagnosis not present

## 2018-08-25 DIAGNOSIS — R1310 Dysphagia, unspecified: Secondary | ICD-10-CM | POA: Diagnosis not present

## 2018-08-25 DIAGNOSIS — F0280 Dementia in other diseases classified elsewhere without behavioral disturbance: Secondary | ICD-10-CM | POA: Diagnosis not present

## 2018-08-25 DIAGNOSIS — L89322 Pressure ulcer of left buttock, stage 2: Secondary | ICD-10-CM | POA: Diagnosis not present

## 2018-08-25 DIAGNOSIS — G3183 Dementia with Lewy bodies: Secondary | ICD-10-CM | POA: Diagnosis not present

## 2018-08-26 DIAGNOSIS — L89312 Pressure ulcer of right buttock, stage 2: Secondary | ICD-10-CM | POA: Diagnosis not present

## 2018-08-26 DIAGNOSIS — L89322 Pressure ulcer of left buttock, stage 2: Secondary | ICD-10-CM | POA: Diagnosis not present

## 2018-08-26 DIAGNOSIS — G3183 Dementia with Lewy bodies: Secondary | ICD-10-CM | POA: Diagnosis not present

## 2018-08-26 DIAGNOSIS — I69351 Hemiplegia and hemiparesis following cerebral infarction affecting right dominant side: Secondary | ICD-10-CM | POA: Diagnosis not present

## 2018-08-26 DIAGNOSIS — R1310 Dysphagia, unspecified: Secondary | ICD-10-CM | POA: Diagnosis not present

## 2018-08-26 DIAGNOSIS — F0280 Dementia in other diseases classified elsewhere without behavioral disturbance: Secondary | ICD-10-CM | POA: Diagnosis not present

## 2018-08-29 DIAGNOSIS — L89322 Pressure ulcer of left buttock, stage 2: Secondary | ICD-10-CM | POA: Diagnosis not present

## 2018-08-29 DIAGNOSIS — F0280 Dementia in other diseases classified elsewhere without behavioral disturbance: Secondary | ICD-10-CM | POA: Diagnosis not present

## 2018-08-29 DIAGNOSIS — G3183 Dementia with Lewy bodies: Secondary | ICD-10-CM | POA: Diagnosis not present

## 2018-08-29 DIAGNOSIS — I69351 Hemiplegia and hemiparesis following cerebral infarction affecting right dominant side: Secondary | ICD-10-CM | POA: Diagnosis not present

## 2018-08-29 DIAGNOSIS — R1310 Dysphagia, unspecified: Secondary | ICD-10-CM | POA: Diagnosis not present

## 2018-08-29 DIAGNOSIS — L89312 Pressure ulcer of right buttock, stage 2: Secondary | ICD-10-CM | POA: Diagnosis not present

## 2018-08-31 DIAGNOSIS — R1310 Dysphagia, unspecified: Secondary | ICD-10-CM | POA: Diagnosis not present

## 2018-08-31 DIAGNOSIS — G3183 Dementia with Lewy bodies: Secondary | ICD-10-CM | POA: Diagnosis not present

## 2018-08-31 DIAGNOSIS — L89312 Pressure ulcer of right buttock, stage 2: Secondary | ICD-10-CM | POA: Diagnosis not present

## 2018-08-31 DIAGNOSIS — L89322 Pressure ulcer of left buttock, stage 2: Secondary | ICD-10-CM | POA: Diagnosis not present

## 2018-08-31 DIAGNOSIS — F0280 Dementia in other diseases classified elsewhere without behavioral disturbance: Secondary | ICD-10-CM | POA: Diagnosis not present

## 2018-08-31 DIAGNOSIS — I69351 Hemiplegia and hemiparesis following cerebral infarction affecting right dominant side: Secondary | ICD-10-CM | POA: Diagnosis not present

## 2018-09-01 DIAGNOSIS — L89312 Pressure ulcer of right buttock, stage 2: Secondary | ICD-10-CM | POA: Diagnosis not present

## 2018-09-01 DIAGNOSIS — G3183 Dementia with Lewy bodies: Secondary | ICD-10-CM | POA: Diagnosis not present

## 2018-09-01 DIAGNOSIS — F0150 Vascular dementia without behavioral disturbance: Secondary | ICD-10-CM | POA: Diagnosis not present

## 2018-09-01 DIAGNOSIS — F0280 Dementia in other diseases classified elsewhere without behavioral disturbance: Secondary | ICD-10-CM | POA: Diagnosis not present

## 2018-09-01 DIAGNOSIS — R1310 Dysphagia, unspecified: Secondary | ICD-10-CM | POA: Diagnosis not present

## 2018-09-01 DIAGNOSIS — E1142 Type 2 diabetes mellitus with diabetic polyneuropathy: Secondary | ICD-10-CM | POA: Diagnosis not present

## 2018-09-01 DIAGNOSIS — Z794 Long term (current) use of insulin: Secondary | ICD-10-CM | POA: Diagnosis not present

## 2018-09-01 DIAGNOSIS — I959 Hypotension, unspecified: Secondary | ICD-10-CM | POA: Diagnosis not present

## 2018-09-01 DIAGNOSIS — F0390 Unspecified dementia without behavioral disturbance: Secondary | ICD-10-CM | POA: Diagnosis not present

## 2018-09-01 DIAGNOSIS — I69351 Hemiplegia and hemiparesis following cerebral infarction affecting right dominant side: Secondary | ICD-10-CM | POA: Diagnosis not present

## 2018-09-01 DIAGNOSIS — L89322 Pressure ulcer of left buttock, stage 2: Secondary | ICD-10-CM | POA: Diagnosis not present

## 2018-09-02 DIAGNOSIS — L89322 Pressure ulcer of left buttock, stage 2: Secondary | ICD-10-CM | POA: Diagnosis not present

## 2018-09-02 DIAGNOSIS — R1310 Dysphagia, unspecified: Secondary | ICD-10-CM | POA: Diagnosis not present

## 2018-09-02 DIAGNOSIS — I69351 Hemiplegia and hemiparesis following cerebral infarction affecting right dominant side: Secondary | ICD-10-CM | POA: Diagnosis not present

## 2018-09-02 DIAGNOSIS — G3183 Dementia with Lewy bodies: Secondary | ICD-10-CM | POA: Diagnosis not present

## 2018-09-02 DIAGNOSIS — F0280 Dementia in other diseases classified elsewhere without behavioral disturbance: Secondary | ICD-10-CM | POA: Diagnosis not present

## 2018-09-02 DIAGNOSIS — L89312 Pressure ulcer of right buttock, stage 2: Secondary | ICD-10-CM | POA: Diagnosis not present

## 2018-09-05 DIAGNOSIS — R1310 Dysphagia, unspecified: Secondary | ICD-10-CM | POA: Diagnosis not present

## 2018-09-05 DIAGNOSIS — I69351 Hemiplegia and hemiparesis following cerebral infarction affecting right dominant side: Secondary | ICD-10-CM | POA: Diagnosis not present

## 2018-09-05 DIAGNOSIS — L89322 Pressure ulcer of left buttock, stage 2: Secondary | ICD-10-CM | POA: Diagnosis not present

## 2018-09-05 DIAGNOSIS — F0280 Dementia in other diseases classified elsewhere without behavioral disturbance: Secondary | ICD-10-CM | POA: Diagnosis not present

## 2018-09-05 DIAGNOSIS — G3183 Dementia with Lewy bodies: Secondary | ICD-10-CM | POA: Diagnosis not present

## 2018-09-05 DIAGNOSIS — L89312 Pressure ulcer of right buttock, stage 2: Secondary | ICD-10-CM | POA: Diagnosis not present

## 2018-09-06 DIAGNOSIS — I69351 Hemiplegia and hemiparesis following cerebral infarction affecting right dominant side: Secondary | ICD-10-CM | POA: Diagnosis not present

## 2018-09-06 DIAGNOSIS — R1310 Dysphagia, unspecified: Secondary | ICD-10-CM | POA: Diagnosis not present

## 2018-09-06 DIAGNOSIS — L89312 Pressure ulcer of right buttock, stage 2: Secondary | ICD-10-CM | POA: Diagnosis not present

## 2018-09-06 DIAGNOSIS — F0280 Dementia in other diseases classified elsewhere without behavioral disturbance: Secondary | ICD-10-CM | POA: Diagnosis not present

## 2018-09-06 DIAGNOSIS — G3183 Dementia with Lewy bodies: Secondary | ICD-10-CM | POA: Diagnosis not present

## 2018-09-06 DIAGNOSIS — L89322 Pressure ulcer of left buttock, stage 2: Secondary | ICD-10-CM | POA: Diagnosis not present

## 2018-09-07 DIAGNOSIS — L89312 Pressure ulcer of right buttock, stage 2: Secondary | ICD-10-CM | POA: Diagnosis not present

## 2018-09-07 DIAGNOSIS — G3183 Dementia with Lewy bodies: Secondary | ICD-10-CM | POA: Diagnosis not present

## 2018-09-07 DIAGNOSIS — L89322 Pressure ulcer of left buttock, stage 2: Secondary | ICD-10-CM | POA: Diagnosis not present

## 2018-09-07 DIAGNOSIS — F0280 Dementia in other diseases classified elsewhere without behavioral disturbance: Secondary | ICD-10-CM | POA: Diagnosis not present

## 2018-09-07 DIAGNOSIS — I69351 Hemiplegia and hemiparesis following cerebral infarction affecting right dominant side: Secondary | ICD-10-CM | POA: Diagnosis not present

## 2018-09-07 DIAGNOSIS — R1310 Dysphagia, unspecified: Secondary | ICD-10-CM | POA: Diagnosis not present

## 2018-09-08 DIAGNOSIS — G3183 Dementia with Lewy bodies: Secondary | ICD-10-CM | POA: Diagnosis not present

## 2018-09-08 DIAGNOSIS — R1310 Dysphagia, unspecified: Secondary | ICD-10-CM | POA: Diagnosis not present

## 2018-09-08 DIAGNOSIS — L89312 Pressure ulcer of right buttock, stage 2: Secondary | ICD-10-CM | POA: Diagnosis not present

## 2018-09-08 DIAGNOSIS — F0280 Dementia in other diseases classified elsewhere without behavioral disturbance: Secondary | ICD-10-CM | POA: Diagnosis not present

## 2018-09-08 DIAGNOSIS — L89322 Pressure ulcer of left buttock, stage 2: Secondary | ICD-10-CM | POA: Diagnosis not present

## 2018-09-08 DIAGNOSIS — I69351 Hemiplegia and hemiparesis following cerebral infarction affecting right dominant side: Secondary | ICD-10-CM | POA: Diagnosis not present

## 2018-09-09 DIAGNOSIS — L89312 Pressure ulcer of right buttock, stage 2: Secondary | ICD-10-CM | POA: Diagnosis not present

## 2018-09-09 DIAGNOSIS — F0280 Dementia in other diseases classified elsewhere without behavioral disturbance: Secondary | ICD-10-CM | POA: Diagnosis not present

## 2018-09-09 DIAGNOSIS — R1310 Dysphagia, unspecified: Secondary | ICD-10-CM | POA: Diagnosis not present

## 2018-09-09 DIAGNOSIS — L89322 Pressure ulcer of left buttock, stage 2: Secondary | ICD-10-CM | POA: Diagnosis not present

## 2018-09-09 DIAGNOSIS — G3183 Dementia with Lewy bodies: Secondary | ICD-10-CM | POA: Diagnosis not present

## 2018-09-09 DIAGNOSIS — I69351 Hemiplegia and hemiparesis following cerebral infarction affecting right dominant side: Secondary | ICD-10-CM | POA: Diagnosis not present

## 2018-09-14 DIAGNOSIS — L89312 Pressure ulcer of right buttock, stage 2: Secondary | ICD-10-CM | POA: Diagnosis not present

## 2018-09-14 DIAGNOSIS — F0280 Dementia in other diseases classified elsewhere without behavioral disturbance: Secondary | ICD-10-CM | POA: Diagnosis not present

## 2018-09-14 DIAGNOSIS — I69351 Hemiplegia and hemiparesis following cerebral infarction affecting right dominant side: Secondary | ICD-10-CM | POA: Diagnosis not present

## 2018-09-14 DIAGNOSIS — L89322 Pressure ulcer of left buttock, stage 2: Secondary | ICD-10-CM | POA: Diagnosis not present

## 2018-09-14 DIAGNOSIS — G3183 Dementia with Lewy bodies: Secondary | ICD-10-CM | POA: Diagnosis not present

## 2018-09-14 DIAGNOSIS — R1310 Dysphagia, unspecified: Secondary | ICD-10-CM | POA: Diagnosis not present

## 2018-09-15 DIAGNOSIS — R1310 Dysphagia, unspecified: Secondary | ICD-10-CM | POA: Diagnosis not present

## 2018-09-15 DIAGNOSIS — F0280 Dementia in other diseases classified elsewhere without behavioral disturbance: Secondary | ICD-10-CM | POA: Diagnosis not present

## 2018-09-15 DIAGNOSIS — G3183 Dementia with Lewy bodies: Secondary | ICD-10-CM | POA: Diagnosis not present

## 2018-09-15 DIAGNOSIS — L89322 Pressure ulcer of left buttock, stage 2: Secondary | ICD-10-CM | POA: Diagnosis not present

## 2018-09-15 DIAGNOSIS — L89312 Pressure ulcer of right buttock, stage 2: Secondary | ICD-10-CM | POA: Diagnosis not present

## 2018-09-15 DIAGNOSIS — I69351 Hemiplegia and hemiparesis following cerebral infarction affecting right dominant side: Secondary | ICD-10-CM | POA: Diagnosis not present

## 2018-09-16 DIAGNOSIS — L89312 Pressure ulcer of right buttock, stage 2: Secondary | ICD-10-CM | POA: Diagnosis not present

## 2018-09-16 DIAGNOSIS — L89322 Pressure ulcer of left buttock, stage 2: Secondary | ICD-10-CM | POA: Diagnosis not present

## 2018-09-16 DIAGNOSIS — F0280 Dementia in other diseases classified elsewhere without behavioral disturbance: Secondary | ICD-10-CM | POA: Diagnosis not present

## 2018-09-16 DIAGNOSIS — I69351 Hemiplegia and hemiparesis following cerebral infarction affecting right dominant side: Secondary | ICD-10-CM | POA: Diagnosis not present

## 2018-09-16 DIAGNOSIS — R1310 Dysphagia, unspecified: Secondary | ICD-10-CM | POA: Diagnosis not present

## 2018-09-16 DIAGNOSIS — G3183 Dementia with Lewy bodies: Secondary | ICD-10-CM | POA: Diagnosis not present

## 2018-09-19 DIAGNOSIS — L89322 Pressure ulcer of left buttock, stage 2: Secondary | ICD-10-CM | POA: Diagnosis not present

## 2018-09-19 DIAGNOSIS — F0280 Dementia in other diseases classified elsewhere without behavioral disturbance: Secondary | ICD-10-CM | POA: Diagnosis not present

## 2018-09-19 DIAGNOSIS — R1310 Dysphagia, unspecified: Secondary | ICD-10-CM | POA: Diagnosis not present

## 2018-09-19 DIAGNOSIS — G3183 Dementia with Lewy bodies: Secondary | ICD-10-CM | POA: Diagnosis not present

## 2018-09-19 DIAGNOSIS — I69351 Hemiplegia and hemiparesis following cerebral infarction affecting right dominant side: Secondary | ICD-10-CM | POA: Diagnosis not present

## 2018-09-19 DIAGNOSIS — L89312 Pressure ulcer of right buttock, stage 2: Secondary | ICD-10-CM | POA: Diagnosis not present

## 2018-09-21 DIAGNOSIS — L89322 Pressure ulcer of left buttock, stage 2: Secondary | ICD-10-CM | POA: Diagnosis not present

## 2018-09-21 DIAGNOSIS — R1310 Dysphagia, unspecified: Secondary | ICD-10-CM | POA: Diagnosis not present

## 2018-09-21 DIAGNOSIS — G3183 Dementia with Lewy bodies: Secondary | ICD-10-CM | POA: Diagnosis not present

## 2018-09-21 DIAGNOSIS — F0280 Dementia in other diseases classified elsewhere without behavioral disturbance: Secondary | ICD-10-CM | POA: Diagnosis not present

## 2018-09-21 DIAGNOSIS — L89312 Pressure ulcer of right buttock, stage 2: Secondary | ICD-10-CM | POA: Diagnosis not present

## 2018-09-21 DIAGNOSIS — I69351 Hemiplegia and hemiparesis following cerebral infarction affecting right dominant side: Secondary | ICD-10-CM | POA: Diagnosis not present

## 2018-09-22 DIAGNOSIS — F0280 Dementia in other diseases classified elsewhere without behavioral disturbance: Secondary | ICD-10-CM | POA: Diagnosis not present

## 2018-09-22 DIAGNOSIS — I69351 Hemiplegia and hemiparesis following cerebral infarction affecting right dominant side: Secondary | ICD-10-CM | POA: Diagnosis not present

## 2018-09-22 DIAGNOSIS — L89322 Pressure ulcer of left buttock, stage 2: Secondary | ICD-10-CM | POA: Diagnosis not present

## 2018-09-22 DIAGNOSIS — R1310 Dysphagia, unspecified: Secondary | ICD-10-CM | POA: Diagnosis not present

## 2018-09-22 DIAGNOSIS — G3183 Dementia with Lewy bodies: Secondary | ICD-10-CM | POA: Diagnosis not present

## 2018-09-22 DIAGNOSIS — L89312 Pressure ulcer of right buttock, stage 2: Secondary | ICD-10-CM | POA: Diagnosis not present

## 2018-09-23 DIAGNOSIS — I10 Essential (primary) hypertension: Secondary | ICD-10-CM | POA: Diagnosis not present

## 2018-09-23 DIAGNOSIS — L89322 Pressure ulcer of left buttock, stage 2: Secondary | ICD-10-CM | POA: Diagnosis not present

## 2018-09-23 DIAGNOSIS — I69351 Hemiplegia and hemiparesis following cerebral infarction affecting right dominant side: Secondary | ICD-10-CM | POA: Diagnosis not present

## 2018-09-23 DIAGNOSIS — E119 Type 2 diabetes mellitus without complications: Secondary | ICD-10-CM | POA: Diagnosis not present

## 2018-09-23 DIAGNOSIS — Z87891 Personal history of nicotine dependence: Secondary | ICD-10-CM | POA: Diagnosis not present

## 2018-09-23 DIAGNOSIS — R1310 Dysphagia, unspecified: Secondary | ICD-10-CM | POA: Diagnosis not present

## 2018-09-23 DIAGNOSIS — F0280 Dementia in other diseases classified elsewhere without behavioral disturbance: Secondary | ICD-10-CM | POA: Diagnosis not present

## 2018-09-23 DIAGNOSIS — L89312 Pressure ulcer of right buttock, stage 2: Secondary | ICD-10-CM | POA: Diagnosis not present

## 2018-09-23 DIAGNOSIS — G3183 Dementia with Lewy bodies: Secondary | ICD-10-CM | POA: Diagnosis not present

## 2018-09-23 DIAGNOSIS — G40209 Localization-related (focal) (partial) symptomatic epilepsy and epileptic syndromes with complex partial seizures, not intractable, without status epilepticus: Secondary | ICD-10-CM | POA: Diagnosis not present

## 2018-09-26 DIAGNOSIS — I69351 Hemiplegia and hemiparesis following cerebral infarction affecting right dominant side: Secondary | ICD-10-CM | POA: Diagnosis not present

## 2018-09-26 DIAGNOSIS — G3183 Dementia with Lewy bodies: Secondary | ICD-10-CM | POA: Diagnosis not present

## 2018-09-26 DIAGNOSIS — F0280 Dementia in other diseases classified elsewhere without behavioral disturbance: Secondary | ICD-10-CM | POA: Diagnosis not present

## 2018-09-26 DIAGNOSIS — L89322 Pressure ulcer of left buttock, stage 2: Secondary | ICD-10-CM | POA: Diagnosis not present

## 2018-09-26 DIAGNOSIS — R1310 Dysphagia, unspecified: Secondary | ICD-10-CM | POA: Diagnosis not present

## 2018-09-26 DIAGNOSIS — L89312 Pressure ulcer of right buttock, stage 2: Secondary | ICD-10-CM | POA: Diagnosis not present

## 2018-09-28 DIAGNOSIS — I69351 Hemiplegia and hemiparesis following cerebral infarction affecting right dominant side: Secondary | ICD-10-CM | POA: Diagnosis not present

## 2018-09-28 DIAGNOSIS — L89312 Pressure ulcer of right buttock, stage 2: Secondary | ICD-10-CM | POA: Diagnosis not present

## 2018-09-28 DIAGNOSIS — F0280 Dementia in other diseases classified elsewhere without behavioral disturbance: Secondary | ICD-10-CM | POA: Diagnosis not present

## 2018-09-28 DIAGNOSIS — R1310 Dysphagia, unspecified: Secondary | ICD-10-CM | POA: Diagnosis not present

## 2018-09-28 DIAGNOSIS — G3183 Dementia with Lewy bodies: Secondary | ICD-10-CM | POA: Diagnosis not present

## 2018-09-28 DIAGNOSIS — L89322 Pressure ulcer of left buttock, stage 2: Secondary | ICD-10-CM | POA: Diagnosis not present

## 2018-09-29 DIAGNOSIS — R1310 Dysphagia, unspecified: Secondary | ICD-10-CM | POA: Diagnosis not present

## 2018-09-29 DIAGNOSIS — L89322 Pressure ulcer of left buttock, stage 2: Secondary | ICD-10-CM | POA: Diagnosis not present

## 2018-09-29 DIAGNOSIS — G3183 Dementia with Lewy bodies: Secondary | ICD-10-CM | POA: Diagnosis not present

## 2018-09-29 DIAGNOSIS — I69351 Hemiplegia and hemiparesis following cerebral infarction affecting right dominant side: Secondary | ICD-10-CM | POA: Diagnosis not present

## 2018-09-29 DIAGNOSIS — F0280 Dementia in other diseases classified elsewhere without behavioral disturbance: Secondary | ICD-10-CM | POA: Diagnosis not present

## 2018-09-29 DIAGNOSIS — L89312 Pressure ulcer of right buttock, stage 2: Secondary | ICD-10-CM | POA: Diagnosis not present

## 2018-09-30 DIAGNOSIS — F0280 Dementia in other diseases classified elsewhere without behavioral disturbance: Secondary | ICD-10-CM | POA: Diagnosis not present

## 2018-09-30 DIAGNOSIS — I69351 Hemiplegia and hemiparesis following cerebral infarction affecting right dominant side: Secondary | ICD-10-CM | POA: Diagnosis not present

## 2018-09-30 DIAGNOSIS — R1310 Dysphagia, unspecified: Secondary | ICD-10-CM | POA: Diagnosis not present

## 2018-09-30 DIAGNOSIS — G3183 Dementia with Lewy bodies: Secondary | ICD-10-CM | POA: Diagnosis not present

## 2018-09-30 DIAGNOSIS — L89312 Pressure ulcer of right buttock, stage 2: Secondary | ICD-10-CM | POA: Diagnosis not present

## 2018-09-30 DIAGNOSIS — L89322 Pressure ulcer of left buttock, stage 2: Secondary | ICD-10-CM | POA: Diagnosis not present

## 2018-10-03 DIAGNOSIS — R1310 Dysphagia, unspecified: Secondary | ICD-10-CM | POA: Diagnosis not present

## 2018-10-03 DIAGNOSIS — L89312 Pressure ulcer of right buttock, stage 2: Secondary | ICD-10-CM | POA: Diagnosis not present

## 2018-10-03 DIAGNOSIS — G3183 Dementia with Lewy bodies: Secondary | ICD-10-CM | POA: Diagnosis not present

## 2018-10-03 DIAGNOSIS — L89322 Pressure ulcer of left buttock, stage 2: Secondary | ICD-10-CM | POA: Diagnosis not present

## 2018-10-03 DIAGNOSIS — F0280 Dementia in other diseases classified elsewhere without behavioral disturbance: Secondary | ICD-10-CM | POA: Diagnosis not present

## 2018-10-03 DIAGNOSIS — I69351 Hemiplegia and hemiparesis following cerebral infarction affecting right dominant side: Secondary | ICD-10-CM | POA: Diagnosis not present

## 2018-10-04 DIAGNOSIS — L89312 Pressure ulcer of right buttock, stage 2: Secondary | ICD-10-CM | POA: Diagnosis not present

## 2018-10-04 DIAGNOSIS — L89322 Pressure ulcer of left buttock, stage 2: Secondary | ICD-10-CM | POA: Diagnosis not present

## 2018-10-04 DIAGNOSIS — I69351 Hemiplegia and hemiparesis following cerebral infarction affecting right dominant side: Secondary | ICD-10-CM | POA: Diagnosis not present

## 2018-10-04 DIAGNOSIS — R1310 Dysphagia, unspecified: Secondary | ICD-10-CM | POA: Diagnosis not present

## 2018-10-04 DIAGNOSIS — F0280 Dementia in other diseases classified elsewhere without behavioral disturbance: Secondary | ICD-10-CM | POA: Diagnosis not present

## 2018-10-04 DIAGNOSIS — G3183 Dementia with Lewy bodies: Secondary | ICD-10-CM | POA: Diagnosis not present

## 2018-10-05 DIAGNOSIS — F0280 Dementia in other diseases classified elsewhere without behavioral disturbance: Secondary | ICD-10-CM | POA: Diagnosis not present

## 2018-10-05 DIAGNOSIS — R1310 Dysphagia, unspecified: Secondary | ICD-10-CM | POA: Diagnosis not present

## 2018-10-05 DIAGNOSIS — L89312 Pressure ulcer of right buttock, stage 2: Secondary | ICD-10-CM | POA: Diagnosis not present

## 2018-10-05 DIAGNOSIS — I69351 Hemiplegia and hemiparesis following cerebral infarction affecting right dominant side: Secondary | ICD-10-CM | POA: Diagnosis not present

## 2018-10-05 DIAGNOSIS — G3183 Dementia with Lewy bodies: Secondary | ICD-10-CM | POA: Diagnosis not present

## 2018-10-05 DIAGNOSIS — L89322 Pressure ulcer of left buttock, stage 2: Secondary | ICD-10-CM | POA: Diagnosis not present

## 2018-10-06 DIAGNOSIS — R1310 Dysphagia, unspecified: Secondary | ICD-10-CM | POA: Diagnosis not present

## 2018-10-06 DIAGNOSIS — G3183 Dementia with Lewy bodies: Secondary | ICD-10-CM | POA: Diagnosis not present

## 2018-10-06 DIAGNOSIS — L89312 Pressure ulcer of right buttock, stage 2: Secondary | ICD-10-CM | POA: Diagnosis not present

## 2018-10-06 DIAGNOSIS — F0280 Dementia in other diseases classified elsewhere without behavioral disturbance: Secondary | ICD-10-CM | POA: Diagnosis not present

## 2018-10-06 DIAGNOSIS — I69351 Hemiplegia and hemiparesis following cerebral infarction affecting right dominant side: Secondary | ICD-10-CM | POA: Diagnosis not present

## 2018-10-06 DIAGNOSIS — L89322 Pressure ulcer of left buttock, stage 2: Secondary | ICD-10-CM | POA: Diagnosis not present

## 2018-10-10 DIAGNOSIS — F0280 Dementia in other diseases classified elsewhere without behavioral disturbance: Secondary | ICD-10-CM | POA: Diagnosis not present

## 2018-10-10 DIAGNOSIS — I69351 Hemiplegia and hemiparesis following cerebral infarction affecting right dominant side: Secondary | ICD-10-CM | POA: Diagnosis not present

## 2018-10-10 DIAGNOSIS — R1310 Dysphagia, unspecified: Secondary | ICD-10-CM | POA: Diagnosis not present

## 2018-10-10 DIAGNOSIS — L89312 Pressure ulcer of right buttock, stage 2: Secondary | ICD-10-CM | POA: Diagnosis not present

## 2018-10-10 DIAGNOSIS — G3183 Dementia with Lewy bodies: Secondary | ICD-10-CM | POA: Diagnosis not present

## 2018-10-10 DIAGNOSIS — L89322 Pressure ulcer of left buttock, stage 2: Secondary | ICD-10-CM | POA: Diagnosis not present

## 2018-10-13 DIAGNOSIS — R1310 Dysphagia, unspecified: Secondary | ICD-10-CM | POA: Diagnosis not present

## 2018-10-13 DIAGNOSIS — G3183 Dementia with Lewy bodies: Secondary | ICD-10-CM | POA: Diagnosis not present

## 2018-10-13 DIAGNOSIS — L89322 Pressure ulcer of left buttock, stage 2: Secondary | ICD-10-CM | POA: Diagnosis not present

## 2018-10-13 DIAGNOSIS — I69351 Hemiplegia and hemiparesis following cerebral infarction affecting right dominant side: Secondary | ICD-10-CM | POA: Diagnosis not present

## 2018-10-13 DIAGNOSIS — L89312 Pressure ulcer of right buttock, stage 2: Secondary | ICD-10-CM | POA: Diagnosis not present

## 2018-10-13 DIAGNOSIS — F0280 Dementia in other diseases classified elsewhere without behavioral disturbance: Secondary | ICD-10-CM | POA: Diagnosis not present

## 2018-10-14 DIAGNOSIS — L89312 Pressure ulcer of right buttock, stage 2: Secondary | ICD-10-CM | POA: Diagnosis not present

## 2018-10-14 DIAGNOSIS — L89322 Pressure ulcer of left buttock, stage 2: Secondary | ICD-10-CM | POA: Diagnosis not present

## 2018-10-14 DIAGNOSIS — I69351 Hemiplegia and hemiparesis following cerebral infarction affecting right dominant side: Secondary | ICD-10-CM | POA: Diagnosis not present

## 2018-10-14 DIAGNOSIS — F0280 Dementia in other diseases classified elsewhere without behavioral disturbance: Secondary | ICD-10-CM | POA: Diagnosis not present

## 2018-10-14 DIAGNOSIS — R1310 Dysphagia, unspecified: Secondary | ICD-10-CM | POA: Diagnosis not present

## 2018-10-14 DIAGNOSIS — G3183 Dementia with Lewy bodies: Secondary | ICD-10-CM | POA: Diagnosis not present

## 2018-10-17 DIAGNOSIS — G3183 Dementia with Lewy bodies: Secondary | ICD-10-CM | POA: Diagnosis not present

## 2018-10-17 DIAGNOSIS — L89312 Pressure ulcer of right buttock, stage 2: Secondary | ICD-10-CM | POA: Diagnosis not present

## 2018-10-17 DIAGNOSIS — F0280 Dementia in other diseases classified elsewhere without behavioral disturbance: Secondary | ICD-10-CM | POA: Diagnosis not present

## 2018-10-17 DIAGNOSIS — I69351 Hemiplegia and hemiparesis following cerebral infarction affecting right dominant side: Secondary | ICD-10-CM | POA: Diagnosis not present

## 2018-10-17 DIAGNOSIS — R1310 Dysphagia, unspecified: Secondary | ICD-10-CM | POA: Diagnosis not present

## 2018-10-17 DIAGNOSIS — L89322 Pressure ulcer of left buttock, stage 2: Secondary | ICD-10-CM | POA: Diagnosis not present

## 2018-10-19 DIAGNOSIS — I69351 Hemiplegia and hemiparesis following cerebral infarction affecting right dominant side: Secondary | ICD-10-CM | POA: Diagnosis not present

## 2018-10-19 DIAGNOSIS — L89312 Pressure ulcer of right buttock, stage 2: Secondary | ICD-10-CM | POA: Diagnosis not present

## 2018-10-19 DIAGNOSIS — G3183 Dementia with Lewy bodies: Secondary | ICD-10-CM | POA: Diagnosis not present

## 2018-10-19 DIAGNOSIS — F0280 Dementia in other diseases classified elsewhere without behavioral disturbance: Secondary | ICD-10-CM | POA: Diagnosis not present

## 2018-10-19 DIAGNOSIS — L89322 Pressure ulcer of left buttock, stage 2: Secondary | ICD-10-CM | POA: Diagnosis not present

## 2018-10-19 DIAGNOSIS — R1310 Dysphagia, unspecified: Secondary | ICD-10-CM | POA: Diagnosis not present

## 2018-10-20 DIAGNOSIS — G3183 Dementia with Lewy bodies: Secondary | ICD-10-CM | POA: Diagnosis not present

## 2018-10-20 DIAGNOSIS — F0280 Dementia in other diseases classified elsewhere without behavioral disturbance: Secondary | ICD-10-CM | POA: Diagnosis not present

## 2018-10-20 DIAGNOSIS — L89312 Pressure ulcer of right buttock, stage 2: Secondary | ICD-10-CM | POA: Diagnosis not present

## 2018-10-20 DIAGNOSIS — R1310 Dysphagia, unspecified: Secondary | ICD-10-CM | POA: Diagnosis not present

## 2018-10-20 DIAGNOSIS — L89322 Pressure ulcer of left buttock, stage 2: Secondary | ICD-10-CM | POA: Diagnosis not present

## 2018-10-20 DIAGNOSIS — I69351 Hemiplegia and hemiparesis following cerebral infarction affecting right dominant side: Secondary | ICD-10-CM | POA: Diagnosis not present

## 2018-10-21 DIAGNOSIS — F0280 Dementia in other diseases classified elsewhere without behavioral disturbance: Secondary | ICD-10-CM | POA: Diagnosis not present

## 2018-10-21 DIAGNOSIS — L89312 Pressure ulcer of right buttock, stage 2: Secondary | ICD-10-CM | POA: Diagnosis not present

## 2018-10-21 DIAGNOSIS — R1310 Dysphagia, unspecified: Secondary | ICD-10-CM | POA: Diagnosis not present

## 2018-10-21 DIAGNOSIS — G3183 Dementia with Lewy bodies: Secondary | ICD-10-CM | POA: Diagnosis not present

## 2018-10-21 DIAGNOSIS — I69351 Hemiplegia and hemiparesis following cerebral infarction affecting right dominant side: Secondary | ICD-10-CM | POA: Diagnosis not present

## 2018-10-21 DIAGNOSIS — L89322 Pressure ulcer of left buttock, stage 2: Secondary | ICD-10-CM | POA: Diagnosis not present

## 2018-10-22 DIAGNOSIS — L89312 Pressure ulcer of right buttock, stage 2: Secondary | ICD-10-CM | POA: Diagnosis not present

## 2018-10-22 DIAGNOSIS — Z87891 Personal history of nicotine dependence: Secondary | ICD-10-CM | POA: Diagnosis not present

## 2018-10-22 DIAGNOSIS — L89322 Pressure ulcer of left buttock, stage 2: Secondary | ICD-10-CM | POA: Diagnosis not present

## 2018-10-22 DIAGNOSIS — G40209 Localization-related (focal) (partial) symptomatic epilepsy and epileptic syndromes with complex partial seizures, not intractable, without status epilepticus: Secondary | ICD-10-CM | POA: Diagnosis not present

## 2018-10-22 DIAGNOSIS — R1310 Dysphagia, unspecified: Secondary | ICD-10-CM | POA: Diagnosis not present

## 2018-10-22 DIAGNOSIS — G3183 Dementia with Lewy bodies: Secondary | ICD-10-CM | POA: Diagnosis not present

## 2018-10-22 DIAGNOSIS — F0280 Dementia in other diseases classified elsewhere without behavioral disturbance: Secondary | ICD-10-CM | POA: Diagnosis not present

## 2018-10-22 DIAGNOSIS — I69351 Hemiplegia and hemiparesis following cerebral infarction affecting right dominant side: Secondary | ICD-10-CM | POA: Diagnosis not present

## 2018-10-22 DIAGNOSIS — I10 Essential (primary) hypertension: Secondary | ICD-10-CM | POA: Diagnosis not present

## 2018-10-22 DIAGNOSIS — E119 Type 2 diabetes mellitus without complications: Secondary | ICD-10-CM | POA: Diagnosis not present

## 2018-10-24 DIAGNOSIS — L89322 Pressure ulcer of left buttock, stage 2: Secondary | ICD-10-CM | POA: Diagnosis not present

## 2018-10-24 DIAGNOSIS — L89312 Pressure ulcer of right buttock, stage 2: Secondary | ICD-10-CM | POA: Diagnosis not present

## 2018-10-24 DIAGNOSIS — F0280 Dementia in other diseases classified elsewhere without behavioral disturbance: Secondary | ICD-10-CM | POA: Diagnosis not present

## 2018-10-24 DIAGNOSIS — G3183 Dementia with Lewy bodies: Secondary | ICD-10-CM | POA: Diagnosis not present

## 2018-10-24 DIAGNOSIS — R1310 Dysphagia, unspecified: Secondary | ICD-10-CM | POA: Diagnosis not present

## 2018-10-24 DIAGNOSIS — I69351 Hemiplegia and hemiparesis following cerebral infarction affecting right dominant side: Secondary | ICD-10-CM | POA: Diagnosis not present

## 2018-10-26 DIAGNOSIS — L89322 Pressure ulcer of left buttock, stage 2: Secondary | ICD-10-CM | POA: Diagnosis not present

## 2018-10-26 DIAGNOSIS — L89312 Pressure ulcer of right buttock, stage 2: Secondary | ICD-10-CM | POA: Diagnosis not present

## 2018-10-26 DIAGNOSIS — F0280 Dementia in other diseases classified elsewhere without behavioral disturbance: Secondary | ICD-10-CM | POA: Diagnosis not present

## 2018-10-26 DIAGNOSIS — G3183 Dementia with Lewy bodies: Secondary | ICD-10-CM | POA: Diagnosis not present

## 2018-10-26 DIAGNOSIS — R1310 Dysphagia, unspecified: Secondary | ICD-10-CM | POA: Diagnosis not present

## 2018-10-26 DIAGNOSIS — I69351 Hemiplegia and hemiparesis following cerebral infarction affecting right dominant side: Secondary | ICD-10-CM | POA: Diagnosis not present

## 2018-10-27 DIAGNOSIS — G3183 Dementia with Lewy bodies: Secondary | ICD-10-CM | POA: Diagnosis not present

## 2018-10-27 DIAGNOSIS — L89312 Pressure ulcer of right buttock, stage 2: Secondary | ICD-10-CM | POA: Diagnosis not present

## 2018-10-27 DIAGNOSIS — L89322 Pressure ulcer of left buttock, stage 2: Secondary | ICD-10-CM | POA: Diagnosis not present

## 2018-10-27 DIAGNOSIS — R1310 Dysphagia, unspecified: Secondary | ICD-10-CM | POA: Diagnosis not present

## 2018-10-27 DIAGNOSIS — I69351 Hemiplegia and hemiparesis following cerebral infarction affecting right dominant side: Secondary | ICD-10-CM | POA: Diagnosis not present

## 2018-10-27 DIAGNOSIS — F0280 Dementia in other diseases classified elsewhere without behavioral disturbance: Secondary | ICD-10-CM | POA: Diagnosis not present

## 2018-10-28 DIAGNOSIS — L89322 Pressure ulcer of left buttock, stage 2: Secondary | ICD-10-CM | POA: Diagnosis not present

## 2018-10-28 DIAGNOSIS — I69351 Hemiplegia and hemiparesis following cerebral infarction affecting right dominant side: Secondary | ICD-10-CM | POA: Diagnosis not present

## 2018-10-28 DIAGNOSIS — F0280 Dementia in other diseases classified elsewhere without behavioral disturbance: Secondary | ICD-10-CM | POA: Diagnosis not present

## 2018-10-28 DIAGNOSIS — L89312 Pressure ulcer of right buttock, stage 2: Secondary | ICD-10-CM | POA: Diagnosis not present

## 2018-10-28 DIAGNOSIS — G3183 Dementia with Lewy bodies: Secondary | ICD-10-CM | POA: Diagnosis not present

## 2018-10-28 DIAGNOSIS — R1310 Dysphagia, unspecified: Secondary | ICD-10-CM | POA: Diagnosis not present

## 2018-10-31 DIAGNOSIS — L89312 Pressure ulcer of right buttock, stage 2: Secondary | ICD-10-CM | POA: Diagnosis not present

## 2018-10-31 DIAGNOSIS — I69351 Hemiplegia and hemiparesis following cerebral infarction affecting right dominant side: Secondary | ICD-10-CM | POA: Diagnosis not present

## 2018-10-31 DIAGNOSIS — F0280 Dementia in other diseases classified elsewhere without behavioral disturbance: Secondary | ICD-10-CM | POA: Diagnosis not present

## 2018-10-31 DIAGNOSIS — R1310 Dysphagia, unspecified: Secondary | ICD-10-CM | POA: Diagnosis not present

## 2018-10-31 DIAGNOSIS — L89322 Pressure ulcer of left buttock, stage 2: Secondary | ICD-10-CM | POA: Diagnosis not present

## 2018-10-31 DIAGNOSIS — G3183 Dementia with Lewy bodies: Secondary | ICD-10-CM | POA: Diagnosis not present

## 2018-11-02 DIAGNOSIS — G3183 Dementia with Lewy bodies: Secondary | ICD-10-CM | POA: Diagnosis not present

## 2018-11-02 DIAGNOSIS — L89322 Pressure ulcer of left buttock, stage 2: Secondary | ICD-10-CM | POA: Diagnosis not present

## 2018-11-02 DIAGNOSIS — L89312 Pressure ulcer of right buttock, stage 2: Secondary | ICD-10-CM | POA: Diagnosis not present

## 2018-11-02 DIAGNOSIS — I69351 Hemiplegia and hemiparesis following cerebral infarction affecting right dominant side: Secondary | ICD-10-CM | POA: Diagnosis not present

## 2018-11-02 DIAGNOSIS — R1310 Dysphagia, unspecified: Secondary | ICD-10-CM | POA: Diagnosis not present

## 2018-11-02 DIAGNOSIS — F0280 Dementia in other diseases classified elsewhere without behavioral disturbance: Secondary | ICD-10-CM | POA: Diagnosis not present

## 2018-11-03 DIAGNOSIS — G3183 Dementia with Lewy bodies: Secondary | ICD-10-CM | POA: Diagnosis not present

## 2018-11-03 DIAGNOSIS — I69351 Hemiplegia and hemiparesis following cerebral infarction affecting right dominant side: Secondary | ICD-10-CM | POA: Diagnosis not present

## 2018-11-03 DIAGNOSIS — R1310 Dysphagia, unspecified: Secondary | ICD-10-CM | POA: Diagnosis not present

## 2018-11-03 DIAGNOSIS — L89322 Pressure ulcer of left buttock, stage 2: Secondary | ICD-10-CM | POA: Diagnosis not present

## 2018-11-03 DIAGNOSIS — L89312 Pressure ulcer of right buttock, stage 2: Secondary | ICD-10-CM | POA: Diagnosis not present

## 2018-11-03 DIAGNOSIS — F0280 Dementia in other diseases classified elsewhere without behavioral disturbance: Secondary | ICD-10-CM | POA: Diagnosis not present

## 2018-11-04 DIAGNOSIS — G3183 Dementia with Lewy bodies: Secondary | ICD-10-CM | POA: Diagnosis not present

## 2018-11-04 DIAGNOSIS — R1310 Dysphagia, unspecified: Secondary | ICD-10-CM | POA: Diagnosis not present

## 2018-11-04 DIAGNOSIS — I69351 Hemiplegia and hemiparesis following cerebral infarction affecting right dominant side: Secondary | ICD-10-CM | POA: Diagnosis not present

## 2018-11-04 DIAGNOSIS — L89322 Pressure ulcer of left buttock, stage 2: Secondary | ICD-10-CM | POA: Diagnosis not present

## 2018-11-04 DIAGNOSIS — F0280 Dementia in other diseases classified elsewhere without behavioral disturbance: Secondary | ICD-10-CM | POA: Diagnosis not present

## 2018-11-04 DIAGNOSIS — L89312 Pressure ulcer of right buttock, stage 2: Secondary | ICD-10-CM | POA: Diagnosis not present

## 2018-11-06 DIAGNOSIS — I69351 Hemiplegia and hemiparesis following cerebral infarction affecting right dominant side: Secondary | ICD-10-CM | POA: Diagnosis not present

## 2018-11-06 DIAGNOSIS — G3183 Dementia with Lewy bodies: Secondary | ICD-10-CM | POA: Diagnosis not present

## 2018-11-06 DIAGNOSIS — F0280 Dementia in other diseases classified elsewhere without behavioral disturbance: Secondary | ICD-10-CM | POA: Diagnosis not present

## 2018-11-06 DIAGNOSIS — L89322 Pressure ulcer of left buttock, stage 2: Secondary | ICD-10-CM | POA: Diagnosis not present

## 2018-11-06 DIAGNOSIS — R1310 Dysphagia, unspecified: Secondary | ICD-10-CM | POA: Diagnosis not present

## 2018-11-06 DIAGNOSIS — L89312 Pressure ulcer of right buttock, stage 2: Secondary | ICD-10-CM | POA: Diagnosis not present

## 2018-11-07 DIAGNOSIS — F0280 Dementia in other diseases classified elsewhere without behavioral disturbance: Secondary | ICD-10-CM | POA: Diagnosis not present

## 2018-11-07 DIAGNOSIS — R1310 Dysphagia, unspecified: Secondary | ICD-10-CM | POA: Diagnosis not present

## 2018-11-07 DIAGNOSIS — L89322 Pressure ulcer of left buttock, stage 2: Secondary | ICD-10-CM | POA: Diagnosis not present

## 2018-11-07 DIAGNOSIS — I69351 Hemiplegia and hemiparesis following cerebral infarction affecting right dominant side: Secondary | ICD-10-CM | POA: Diagnosis not present

## 2018-11-07 DIAGNOSIS — G3183 Dementia with Lewy bodies: Secondary | ICD-10-CM | POA: Diagnosis not present

## 2018-11-07 DIAGNOSIS — L89312 Pressure ulcer of right buttock, stage 2: Secondary | ICD-10-CM | POA: Diagnosis not present

## 2018-11-08 DIAGNOSIS — I69351 Hemiplegia and hemiparesis following cerebral infarction affecting right dominant side: Secondary | ICD-10-CM | POA: Diagnosis not present

## 2018-11-08 DIAGNOSIS — L89312 Pressure ulcer of right buttock, stage 2: Secondary | ICD-10-CM | POA: Diagnosis not present

## 2018-11-08 DIAGNOSIS — R1310 Dysphagia, unspecified: Secondary | ICD-10-CM | POA: Diagnosis not present

## 2018-11-08 DIAGNOSIS — F0280 Dementia in other diseases classified elsewhere without behavioral disturbance: Secondary | ICD-10-CM | POA: Diagnosis not present

## 2018-11-08 DIAGNOSIS — L89322 Pressure ulcer of left buttock, stage 2: Secondary | ICD-10-CM | POA: Diagnosis not present

## 2018-11-08 DIAGNOSIS — G3183 Dementia with Lewy bodies: Secondary | ICD-10-CM | POA: Diagnosis not present

## 2018-12-06 IMAGING — CR DG CHEST 1V
1 series · 1 of 1 positions shown · non-contrast
Comparison: November 15, 2017

CLINICAL DATA: Altered mental status with nausea and vomiting

EXAM:
CHEST  1 VIEW

[chest pa]
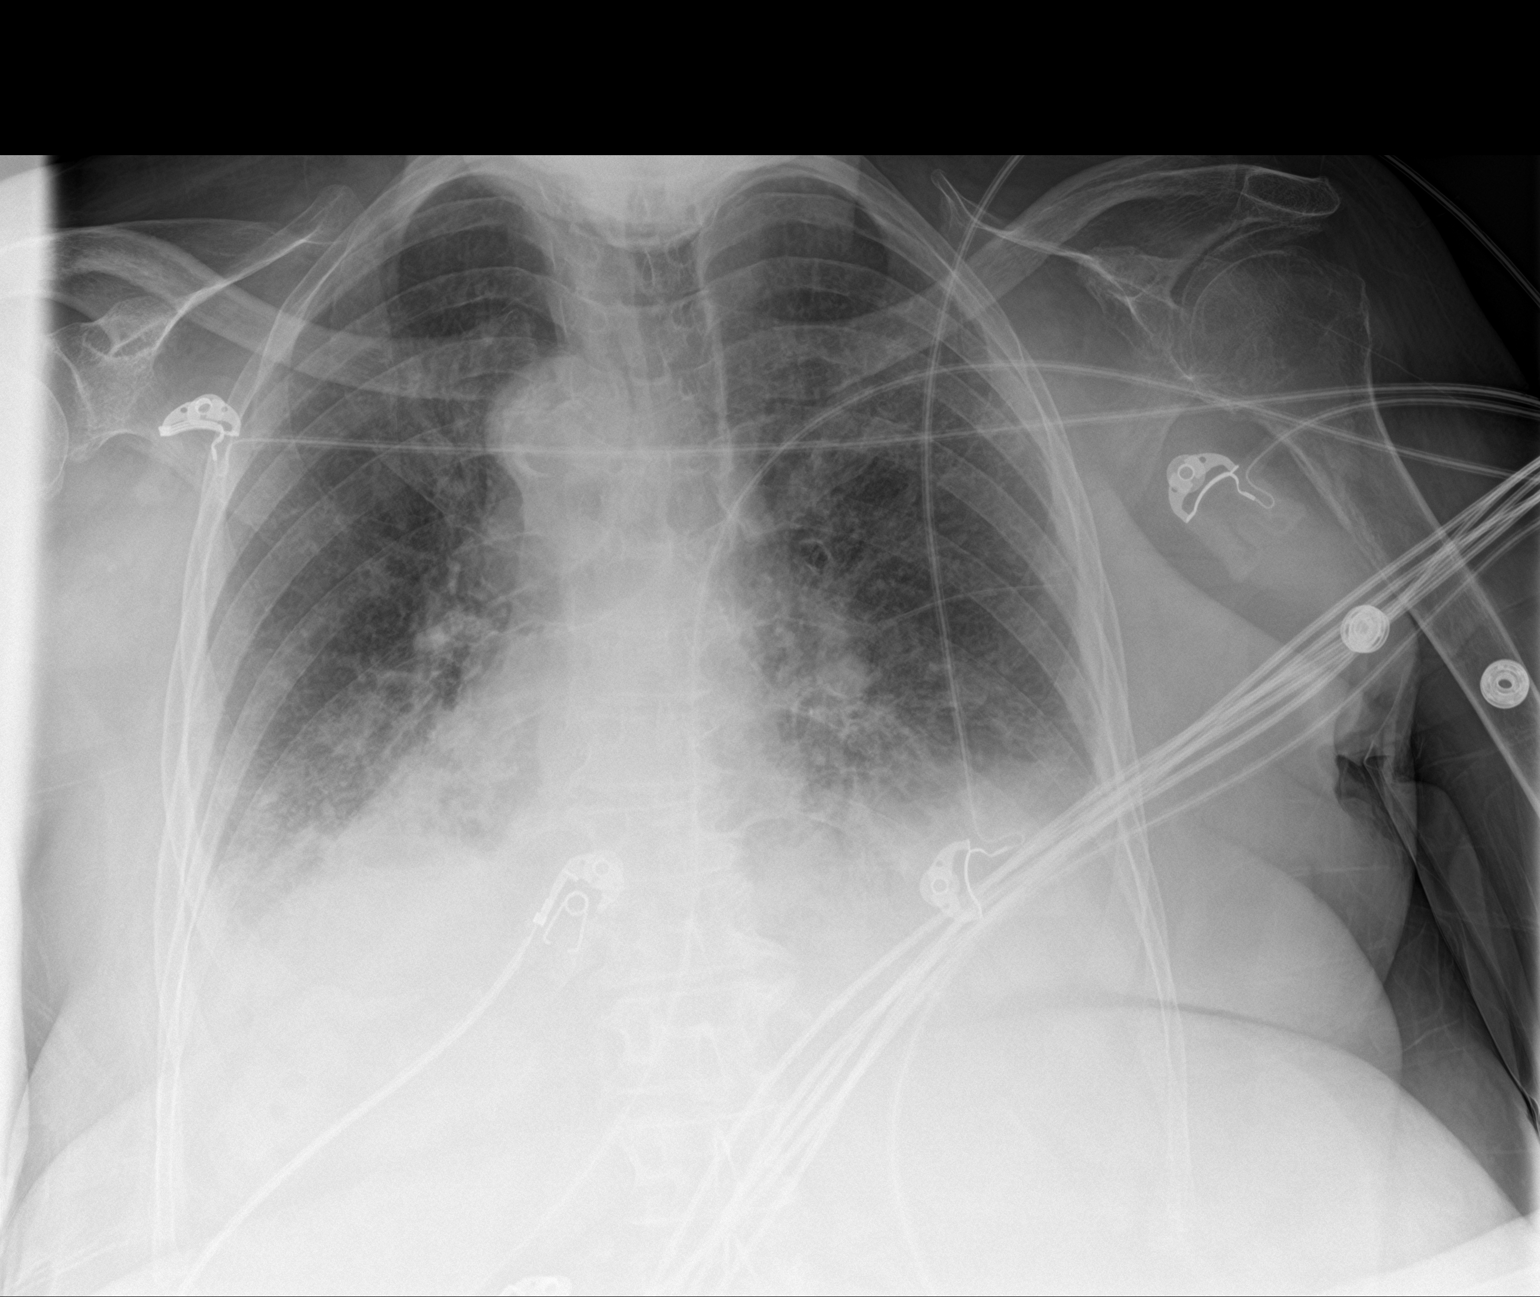

[1 of 1 positions shown; findings below may reference images not displayed]

FINDINGS: There are persistent small pleural effusions with bibasilar
atelectasis. Lungs elsewhere are clear. Heart size and pulmonary
vascularity are normal. No adenopathy. There is aortic
atherosclerosis. No bone lesions.
IMPRESSION: Small pleural effusions with bibasilar atelectasis. No new opacity.
Stable cardiac silhouette. There is aortic atherosclerosis.

Aortic Atherosclerosis ([id number].G).

## 2018-12-16 IMAGING — DX DG CHEST 1V
1 series · 1 of 1 positions shown · non-contrast
Comparison: 11/23/2017

CLINICAL DATA: Decreased appetite.

EXAM:
CHEST  1 VIEW

[chest ap]
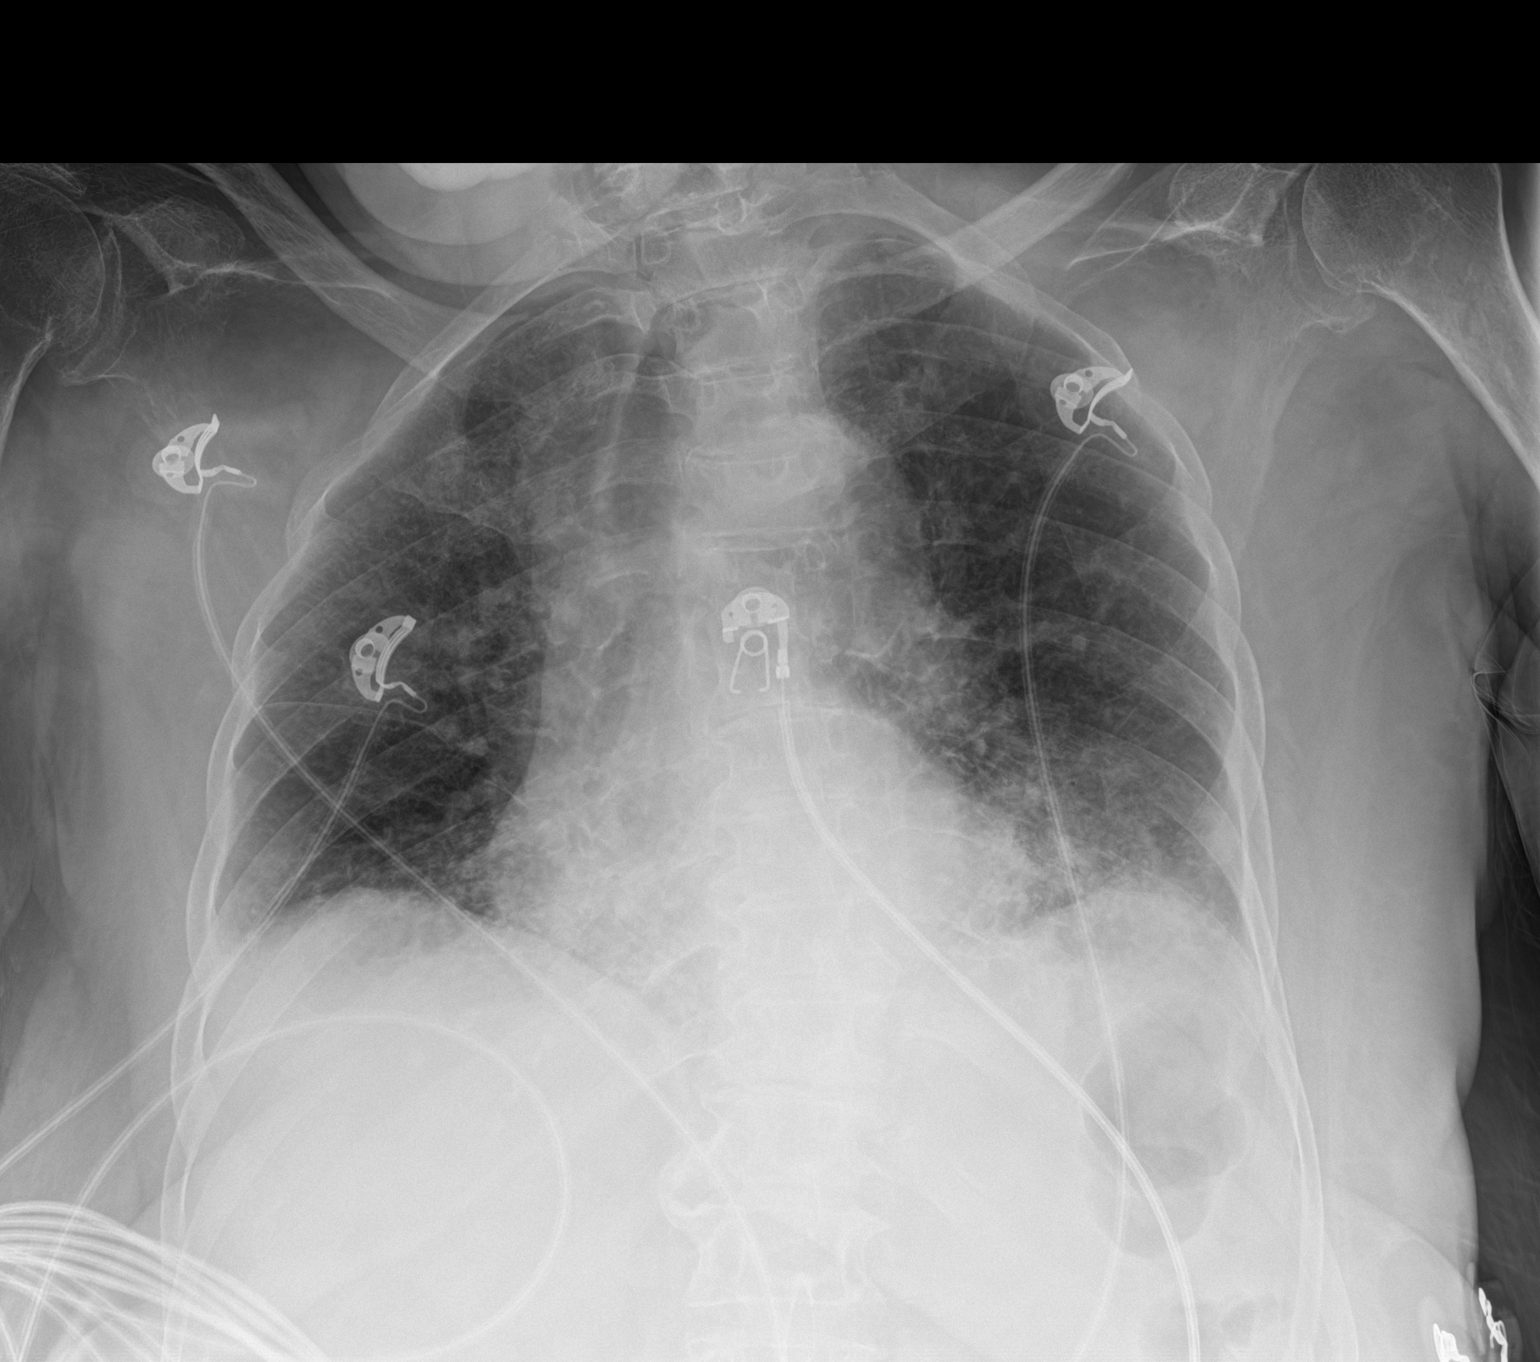

[1 of 1 positions shown; findings below may reference images not displayed]

FINDINGS: Persistent low lung volumes. Small pleural effusions have decreased
from prior exam. Persistent patchy bibasilar opacities. Unchanged
heart size and mediastinal contours allowing for differences in
technique. No pneumothorax or new focal airspace disease.
IMPRESSION: Low lung volumes with persistent bibasilar opacities, may be
pneumonia or aspiration. Diminished bilateral pleural effusions from
prior.

## 2019-01-04 DIAGNOSIS — K5901 Slow transit constipation: Secondary | ICD-10-CM | POA: Diagnosis not present

## 2019-01-04 DIAGNOSIS — Z794 Long term (current) use of insulin: Secondary | ICD-10-CM | POA: Diagnosis not present

## 2019-01-04 DIAGNOSIS — I69359 Hemiplegia and hemiparesis following cerebral infarction affecting unspecified side: Secondary | ICD-10-CM | POA: Diagnosis not present

## 2019-01-04 DIAGNOSIS — F0150 Vascular dementia without behavioral disturbance: Secondary | ICD-10-CM | POA: Diagnosis not present

## 2019-01-04 DIAGNOSIS — G40209 Localization-related (focal) (partial) symptomatic epilepsy and epileptic syndromes with complex partial seizures, not intractable, without status epilepticus: Secondary | ICD-10-CM | POA: Diagnosis not present

## 2019-01-04 DIAGNOSIS — R252 Cramp and spasm: Secondary | ICD-10-CM | POA: Diagnosis not present

## 2019-01-04 DIAGNOSIS — R54 Age-related physical debility: Secondary | ICD-10-CM | POA: Diagnosis not present

## 2019-01-04 DIAGNOSIS — E1142 Type 2 diabetes mellitus with diabetic polyneuropathy: Secondary | ICD-10-CM | POA: Diagnosis not present

## 2019-01-10 DIAGNOSIS — I69351 Hemiplegia and hemiparesis following cerebral infarction affecting right dominant side: Secondary | ICD-10-CM | POA: Diagnosis not present

## 2019-01-10 DIAGNOSIS — F0150 Vascular dementia without behavioral disturbance: Secondary | ICD-10-CM | POA: Diagnosis not present

## 2019-01-10 DIAGNOSIS — Z794 Long term (current) use of insulin: Secondary | ICD-10-CM | POA: Diagnosis not present

## 2019-01-10 DIAGNOSIS — N183 Chronic kidney disease, stage 3 (moderate): Secondary | ICD-10-CM | POA: Diagnosis not present

## 2019-01-10 DIAGNOSIS — I509 Heart failure, unspecified: Secondary | ICD-10-CM | POA: Diagnosis not present

## 2019-01-10 DIAGNOSIS — I69392 Facial weakness following cerebral infarction: Secondary | ICD-10-CM | POA: Diagnosis not present

## 2019-01-10 DIAGNOSIS — S30816D Abrasion of unspecified external genital organs, female, subsequent encounter: Secondary | ICD-10-CM | POA: Diagnosis not present

## 2019-01-10 DIAGNOSIS — I69311 Memory deficit following cerebral infarction: Secondary | ICD-10-CM | POA: Diagnosis not present

## 2019-01-10 DIAGNOSIS — E1122 Type 2 diabetes mellitus with diabetic chronic kidney disease: Secondary | ICD-10-CM | POA: Diagnosis not present

## 2019-01-10 DIAGNOSIS — I130 Hypertensive heart and chronic kidney disease with heart failure and stage 1 through stage 4 chronic kidney disease, or unspecified chronic kidney disease: Secondary | ICD-10-CM | POA: Diagnosis not present

## 2019-01-30 DIAGNOSIS — B372 Candidiasis of skin and nail: Secondary | ICD-10-CM | POA: Diagnosis not present

## 2019-01-30 DIAGNOSIS — E1142 Type 2 diabetes mellitus with diabetic polyneuropathy: Secondary | ICD-10-CM | POA: Diagnosis not present

## 2019-01-30 DIAGNOSIS — Z794 Long term (current) use of insulin: Secondary | ICD-10-CM | POA: Diagnosis not present

## 2019-03-08 ENCOUNTER — Emergency Department (HOSPITAL_COMMUNITY): Payer: Medicare Other

## 2019-03-08 ENCOUNTER — Other Ambulatory Visit: Payer: Self-pay

## 2019-03-08 ENCOUNTER — Emergency Department (HOSPITAL_COMMUNITY)
Admission: EM | Admit: 2019-03-08 | Discharge: 2019-03-08 | Disposition: A | Discharge status: Home / Self Care | Payer: Medicare Other | Attending: Emergency Medicine | Admitting: Emergency Medicine

## 2019-03-08 ENCOUNTER — Encounter (HOSPITAL_COMMUNITY): Payer: Self-pay | Admitting: *Deleted

## 2019-03-08 DIAGNOSIS — Z7901 Long term (current) use of anticoagulants: Secondary | ICD-10-CM | POA: Insufficient documentation

## 2019-03-08 DIAGNOSIS — F0390 Unspecified dementia without behavioral disturbance: Secondary | ICD-10-CM | POA: Diagnosis not present

## 2019-03-08 DIAGNOSIS — R0789 Other chest pain: Secondary | ICD-10-CM | POA: Insufficient documentation

## 2019-03-08 DIAGNOSIS — Z7984 Long term (current) use of oral hypoglycemic drugs: Secondary | ICD-10-CM | POA: Diagnosis not present

## 2019-03-08 DIAGNOSIS — I10 Essential (primary) hypertension: Secondary | ICD-10-CM | POA: Insufficient documentation

## 2019-03-08 DIAGNOSIS — E119 Type 2 diabetes mellitus without complications: Secondary | ICD-10-CM | POA: Insufficient documentation

## 2019-03-08 DIAGNOSIS — Z79899 Other long term (current) drug therapy: Secondary | ICD-10-CM | POA: Diagnosis not present

## 2019-03-08 LAB — BASIC METABOLIC PANEL
Anion gap: 10 (ref 5–15)
BUN: 40 mg/dL — ABNORMAL HIGH (ref 8–23)
CO2: 23 mmol/L (ref 22–32)
Calcium: 10.2 mg/dL (ref 8.9–10.3)
Chloride: 101 mmol/L (ref 98–111)
Creatinine, Ser: 1.21 mg/dL — ABNORMAL HIGH (ref 0.44–1.00)
GFR calc Af Amer: 44 mL/min — ABNORMAL LOW (ref >60)
GFR calc non Af Amer: 38 mL/min — ABNORMAL LOW (ref >60)
Glucose, Bld: 259 mg/dL — ABNORMAL HIGH (ref 70–99)
Potassium: 5.3 mmol/L — ABNORMAL HIGH (ref 3.5–5.1)
Sodium: 134 mmol/L — ABNORMAL LOW (ref 135–145)

## 2019-03-08 LAB — CBC
HCT: 40.3 % (ref 36.0–46.0)
Hemoglobin: 12.6 g/dL (ref 12.0–15.0)
MCH: 30.4 pg (ref 26.0–34.0)
MCHC: 31.3 g/dL (ref 30.0–36.0)
MCV: 97.3 fL (ref 80.0–100.0)
Platelets: 261 10*3/uL (ref 150–400)
RBC: 4.14 MIL/uL (ref 3.87–5.11)
RDW: 13.6 % (ref 11.5–15.5)
WBC: 10.1 10*3/uL (ref 4.0–10.5)
nRBC: 0 % (ref 0.0–0.2)

## 2019-03-08 LAB — TROPONIN I (HIGH SENSITIVITY): Troponin I (High Sensitivity): 8 ng/L (ref <18)

## 2019-03-08 MED ORDER — SODIUM CHLORIDE 0.9% FLUSH
3.0000 mL | Freq: Once | INTRAVENOUS | Status: AC
  Administered 2019-03-08: 3 mL via INTRAVENOUS

## 2019-03-08 NOTE — ED Provider Notes (Signed)
[facility] Provider Note   CSN: [id number] Arrival date & time: 03/08/19  1208    History   Chief Complaint Chief Complaint  Patient presents with  . Chest Pain    HPI Omar PersonRuby Waxman is a 85 y.o. female.     Level 5 caveat due to dementia.  Patient's family member at bedside provides history.  Patient had episode of chest pain while eating.  Has now resolved.  Patient is at her baseline.  Does not appear to have any discomfort currently.  The history is provided by a caregiver.  Chest Pain Pain location:  Substernal area Progression:  Resolved Chronicity:  New Context: eating   Relieved by:  Nothing Worsened by:  Nothing Risk factors: high cholesterol     Past Medical History:  Diagnosis Date  . Dementia (HCC)   . Diabetes mellitus   . Hyperlipidemia   . Pneumonia 10/2017  . Seizure (HCC)   . Sepsis (HCC) 11/24/2017  . Stroke [facility](HCC) 2012   possible stroke Jan 2016  . UTI (urinary tract infection) 10/2017    Patient Active Problem List   Diagnosis Date Noted  . Failure to thrive in adult 02/07/2018  . Hypernatremia 02/07/2018  . History of ESBL Klebsiella pneumoniae infection 02/07/2018  . Acute encephalopathy 02/07/2018  . Dysphagia   . Acute metabolic encephalopathy 11/01/2017  . Pressure injury of skin 10/26/2017  . UTI (urinary tract infection) 10/26/2017  . Heel ulcer (HCC) 06/18/2017  . Unstageable pressure ulcer of sacral region (HCC) 06/18/2017  . AKI (acute kidney injury) (HCC) 06/18/2017  . Decubitus ulcer of sacral region, stage 2 (HCC)   . Dementia with Lewy bodies (HCC) 08/07/2013  . Vascular dementia (HCC) 08/07/2013  . Sleepiness 08/07/2013  . Protein-calorie malnutrition, severe (HCC) 07/15/2013  . Spastic hemiplegia affecting dominant side (HCC) 06/25/2013  . History of CVA w/residual right side deficits 09/03/2012  . History of partial seizures   . Diabetes mellitus type II, uncontrolled (HCC)  01/06/2012  . HTN (hypertension) 01/06/2012    Past Surgical History:  Procedure Laterality Date  . CATARACT EXTRACTION       OB History   No obstetric history on file.      Home Medications    Prior to Admission medications   Medication Sig Start Date End Date Taking? Authorizing Provider  acetaminophen (TYLENOL) 325 MG tablet Take 2 tablets (650 mg total) by mouth every 6 (six) hours as needed for mild pain (or Fever >/= 101). 11/04/17   Alwyn RenMathews, Elizabeth G, MD  Amino Acids-Protein Hydrolys (FEEDING SUPPLEMENT, PRO-STAT SUGAR FREE 64,) LIQD Take 30 mLs by mouth 4 (four) times daily -  with meals and at bedtime. 10/28/17   Narda BondsNettey, Ralph A, MD  bisacodyl (DULCOLAX) 10 MG suppository Place 10 mg rectally every three (3) days as needed for constipation. 01/31/18   [provider]  ciprofloxacin (CIPRO) 500 MG tablet Take 1 tablet (500 mg total) by mouth 2 (two) times daily. 02/11/18   Ghimire, Werner LeanShanker M, MD  clopidogrel (PLAVIX) 75 MG tablet Take 1 tablet (75 mg total) by mouth daily with breakfast. 08/15/13   [name], John, MD  divalproex (DEPAKOTE SPRINKLE) 125 MG capsule Take 250 mg by mouth 2 (two) times daily. 02/03/18   [provider]  famotidine (PEPCID) 20 MG tablet Take 1 tablet (20 mg total) by mouth daily. 10/29/17   Narda BondsNettey, Ralph A, MD  ferrous sulfate 325 (65 FE) MG tablet Take 325 mg  by mouth daily with breakfast.    [provider]  Insulin Glargine (TOUJEO MAX SOLOSTAR) 300 UNIT/ML SOPN Inject 5-9 Units into the skin at bedtime.    [provider]  lactobacillus acidophilus (BACID) TABS tablet Take 2 tablets by mouth 3 (three) times daily. 11/17/17   Regalado, Belkys A, MD  loperamide (IMODIUM A-D) 2 MG tablet Take 1 tablet (2 mg total) by mouth 4 (four) times daily as needed for diarrhea or loose stools. 11/17/17   Regalado, Belkys A, MD  Melatonin 3 MG TABS Take 1 tablet (3 mg total) by mouth at bedtime as needed (sleep). 11/17/17   Regalado,  Belkys A, MD  memantine (NAMENDA XR) 14 MG CP24 24 hr capsule Take 14 mg by mouth at bedtime.     [provider]  Multiple Vitamin (MULTIVITAMIN WITH MINERALS) TABS tablet Take 1 tablet by mouth daily. 11/18/17   Regalado, Belkys A, MD  NYSTATIN powder Apply 1 application topically 2 (two) times daily as needed for rash. 05/04/17   [provider]  ondansetron (ZOFRAN ODT) 4 MG disintegrating tablet Take 1 tablet (4 mg total) by mouth every 8 (eight) hours as needed. Patient taking differently: Take 4 mg by mouth every 8 (eight) hours as needed for nausea.  11/23/17   Mabe, Latanya MaudlinMartha L, MD  oxyCODONE (OXY IR/ROXICODONE) 5 MG immediate release tablet Take 2.5 mg by mouth every 4 (four) hours as needed for pain. 01/26/18   [provider]  protein supplement shake (PREMIER PROTEIN) LIQD Take 59.1 mLs (2 oz total) by mouth 4 (four) times daily -  with meals and at bedtime. Patient taking differently: Take 2 oz by mouth 3 (three) times daily with meals.  10/28/17   Narda BondsNettey, Ralph A, MD  rosuvastatin (CRESTOR) 5 MG tablet Take 5 mg by mouth every Monday, Wednesday, and Friday. At bedtime    [provider]  vitamin C (ASCORBIC ACID) 500 MG tablet Take 500 mg by mouth daily.    [provider]  zinc oxide (BALMEX) 11.3 % CREA cream Apply 1 application topically daily as needed (for rash).     [provider]    Family History Family History  Problem Relation Age of Onset  . CAD Mother   . Hypertension Mother   . Diabetes Mother   . Stroke Mother     Social History Social History   Tobacco Use  . Smoking status: Never Smoker  . Smokeless tobacco: Former NeurosurgeonUser    Types: Snuff  . Tobacco comment: quit 50-60 years ago  Substance Use Topics  . Alcohol use: No    Alcohol/week: 0.0 standard drinks  . Drug use: No     Allergies   Lipitor [atorvastatin]   Review of Systems Review of Systems  Unable to perform ROS: Dementia  Cardiovascular:  Positive for chest pain.     Physical Exam Updated Vital Signs  ED Triage Vitals [03/08/19 1215]  Enc Vitals Group     BP (!) 126/59     Pulse Rate 87     Resp 19     Temp 97.7 F (36.5 C)     Temp Source Oral     SpO2 98 %     Weight      Height      Head Circumference      Peak Flow      Pain Score      Pain Loc      Pain Edu?  Excl. in GC?     Physical Exam Vitals signs and nursing note reviewed.  Constitutional:      General: She is not in acute distress.    Appearance: She is well-developed. She is not ill-appearing.  HENT:     Head: Normocephalic and atraumatic.  Eyes:     Conjunctiva/sclera: Conjunctivae normal.  Neck:     Musculoskeletal: Normal range of motion and neck supple.  Cardiovascular:     Rate and Rhythm: Normal rate and regular rhythm.     Pulses:          Radial pulses are 2+ on the right side and 2+ on the left side.     Heart sounds: Normal heart sounds. No murmur.  Pulmonary:     Effort: Pulmonary effort is normal. No respiratory distress.     Breath sounds: Normal breath sounds. No decreased breath sounds or wheezing.  Abdominal:     Palpations: Abdomen is soft.     Tenderness: There is no abdominal tenderness.  Musculoskeletal:     Right lower leg: No edema.     Left lower leg: No edema.  Skin:    General: Skin is warm and dry.  Neurological:     General: No focal deficit present.     Mental Status: She is alert.      ED Treatments / Results  Labs (all labs ordered are listed, but only abnormal results are displayed) Labs Reviewed  BASIC METABOLIC PANEL - Abnormal; Notable for the following components:      Result Value   Sodium 134 (*)    Potassium 5.3 (*)    Glucose, Bld 259 (*)    BUN 40 (*)    Creatinine, Ser 1.21 (*)    GFR calc non Af Amer 38 (*)    GFR calc Af Amer 44 (*)    All other components within normal limits  CBC  TROPONIN I (HIGH SENSITIVITY)    EKG EKG Interpretation  Date/Time:  Thursday  March 08 2019 12:16:16 EDT Ventricular Rate:  89 PR Interval:    QRS Duration: 88 QT Interval:  338 QTC Calculation: 412 R Axis:   -12 Text Interpretation:  Accelerated junctional rhythm Low voltage, precordial leads Confirmed by Virgina NorfolkAdam, Martice Doty [phone]([id number]) on 03/08/2019 12:20:52 PM   Radiology Dg Chest 2 View  Result Date: 03/08/2019 CLINICAL DATA:  Chest pain,altered mental status EXAM: CHEST - 2 VIEW COMPARISON:  02/07/2018 FINDINGS: Heart size is normal. The aorta is tortuous and partially calcified. There are patchy opacities at the lung bases bilaterally, LEFT greater than RIGHT and likely chronic. No new consolidations. No evidence for pulmonary edema. Chronic changes are seen in both shoulders. Degenerative changes are seen in thoracic spine. IMPRESSION: 1. Chronic bibasilar opacities, LEFT greater than RIGHT. 2. No evidence for acute abnormality. Electronically Signed   By: Norva PavlovElizabeth  Brown M.D.   On: 03/08/2019 12:51    Procedures Procedures (including critical care time)  Medications Ordered in ED Medications  sodium chloride flush (NS) 0.9 % injection 3 mL (has no administration in time range)     Initial Impression / Assessment and Plan / ED Course  I have reviewed the triage vital signs and the nursing notes.  Pertinent labs & imaging results that were available during my care of the patient were reviewed by me and considered in my medical decision making (see chart for details).     Omar PersonRuby Symons is a 85 year old female with history of dementia, high cholesterol  who presents to the ED with possible chest pain.  Patient with normal vitals.  No fever.  EKG shows sinus rhythm.  No ischemic changes.  Patient per family had an episode of chest pain while eating.  Has now resolved.  Patient has history of dysphasia.  Only eats pured foods.  Has been able to eat and drink since.  Patient has clear breath sounds.  No signs of respiratory distress.  Normal vitals.  No fever.  Overall  likely food related however will get troponin, basic labs, chest x-ray to rule out any other source for pain.  No significant anemia, electrolyte abnormality, kidney injury.  Chest x-ray unremarkable.  Troponin normal.  Overall no concern for PE or ACS.  Suspect likely food related.  Patient discharged from ED in good condition.  Given return precautions.   This chart was dictated using voice recognition software.  Despite best efforts to proofread,  errors can occur which can change the documentation meaning.   Final Clinical Impressions(s) / ED Diagnoses   Final diagnoses:  Atypical chest pain    ED Discharge Orders    None       Lennice Sites, DO 03/08/19 1415

## 2019-03-08 NOTE — ED Triage Notes (Signed)
Per EMS- pt is at mental baseline post stroke. Pt was reported to have yelled out this morning and her daughter reported that she pointed to her chest. Pt reported an increased HR 91.

## 2019-03-11 DIAGNOSIS — L89312 Pressure ulcer of right buttock, stage 2: Secondary | ICD-10-CM | POA: Diagnosis not present

## 2019-03-11 DIAGNOSIS — Z794 Long term (current) use of insulin: Secondary | ICD-10-CM | POA: Diagnosis not present

## 2019-03-11 DIAGNOSIS — E1122 Type 2 diabetes mellitus with diabetic chronic kidney disease: Secondary | ICD-10-CM | POA: Diagnosis not present

## 2019-03-11 DIAGNOSIS — I69351 Hemiplegia and hemiparesis following cerebral infarction affecting right dominant side: Secondary | ICD-10-CM | POA: Diagnosis not present

## 2019-03-11 DIAGNOSIS — I69311 Memory deficit following cerebral infarction: Secondary | ICD-10-CM | POA: Diagnosis not present

## 2019-03-11 DIAGNOSIS — I69392 Facial weakness following cerebral infarction: Secondary | ICD-10-CM | POA: Diagnosis not present

## 2019-03-11 DIAGNOSIS — L89322 Pressure ulcer of left buttock, stage 2: Secondary | ICD-10-CM | POA: Diagnosis not present

## 2019-03-11 DIAGNOSIS — F0150 Vascular dementia without behavioral disturbance: Secondary | ICD-10-CM | POA: Diagnosis not present

## 2019-03-11 DIAGNOSIS — N183 Chronic kidney disease, stage 3 (moderate): Secondary | ICD-10-CM | POA: Diagnosis not present

## 2019-03-11 DIAGNOSIS — I509 Heart failure, unspecified: Secondary | ICD-10-CM | POA: Diagnosis not present

## 2019-03-11 DIAGNOSIS — I130 Hypertensive heart and chronic kidney disease with heart failure and stage 1 through stage 4 chronic kidney disease, or unspecified chronic kidney disease: Secondary | ICD-10-CM | POA: Diagnosis not present

## 2019-03-11 DIAGNOSIS — G3183 Dementia with Lewy bodies: Secondary | ICD-10-CM | POA: Diagnosis not present

## 2019-05-14 DIAGNOSIS — G3183 Dementia with Lewy bodies: Secondary | ICD-10-CM | POA: Diagnosis not present

## 2019-05-14 DIAGNOSIS — E1142 Type 2 diabetes mellitus with diabetic polyneuropathy: Secondary | ICD-10-CM | POA: Diagnosis not present

## 2019-05-14 DIAGNOSIS — Z794 Long term (current) use of insulin: Secondary | ICD-10-CM | POA: Diagnosis not present

## 2019-05-14 DIAGNOSIS — F0150 Vascular dementia without behavioral disturbance: Secondary | ICD-10-CM | POA: Diagnosis not present

## 2019-05-14 DIAGNOSIS — R54 Age-related physical debility: Secondary | ICD-10-CM | POA: Diagnosis not present

## 2019-05-16 DIAGNOSIS — E1142 Type 2 diabetes mellitus with diabetic polyneuropathy: Secondary | ICD-10-CM | POA: Diagnosis not present

## 2019-05-28 ENCOUNTER — Encounter (HOSPITAL_COMMUNITY): Payer: Self-pay | Admitting: Emergency Medicine

## 2019-05-28 ENCOUNTER — Other Ambulatory Visit: Payer: Self-pay

## 2019-05-28 ENCOUNTER — Emergency Department (HOSPITAL_COMMUNITY)
Admission: EM | Admit: 2019-05-28 | Discharge: 2019-05-29 | Disposition: A | Discharge status: Home / Self Care | Payer: Medicare Other | Attending: Emergency Medicine | Admitting: Emergency Medicine

## 2019-05-28 ENCOUNTER — Emergency Department (HOSPITAL_COMMUNITY): Payer: Medicare Other

## 2019-05-28 DIAGNOSIS — I10 Essential (primary) hypertension: Secondary | ICD-10-CM | POA: Diagnosis not present

## 2019-05-28 DIAGNOSIS — E119 Type 2 diabetes mellitus without complications: Secondary | ICD-10-CM | POA: Diagnosis not present

## 2019-05-28 DIAGNOSIS — R531 Weakness: Secondary | ICD-10-CM | POA: Diagnosis not present

## 2019-05-28 DIAGNOSIS — R4182 Altered mental status, unspecified: Secondary | ICD-10-CM | POA: Diagnosis present

## 2019-05-28 DIAGNOSIS — Z794 Long term (current) use of insulin: Secondary | ICD-10-CM | POA: Insufficient documentation

## 2019-05-28 DIAGNOSIS — Z79899 Other long term (current) drug therapy: Secondary | ICD-10-CM | POA: Diagnosis not present

## 2019-05-28 DIAGNOSIS — N3000 Acute cystitis without hematuria: Secondary | ICD-10-CM | POA: Insufficient documentation

## 2019-05-28 DIAGNOSIS — R404 Transient alteration of awareness: Secondary | ICD-10-CM | POA: Diagnosis not present

## 2019-05-28 LAB — COMPREHENSIVE METABOLIC PANEL
ALT: 11 U/L (ref 0–44)
AST: 18 U/L (ref 15–41)
Albumin: 2.6 g/dL — ABNORMAL LOW (ref 3.5–5.0)
Alkaline Phosphatase: 89 U/L (ref 38–126)
Anion gap: 9 (ref 5–15)
BUN: 25 mg/dL — ABNORMAL HIGH (ref 8–23)
CO2: 24 mmol/L (ref 22–32)
Calcium: 12.1 mg/dL — ABNORMAL HIGH (ref 8.9–10.3)
Chloride: 106 mmol/L (ref 98–111)
Creatinine, Ser: 1 mg/dL (ref 0.44–1.00)
GFR calc Af Amer: 55 mL/min — ABNORMAL LOW (ref >60)
GFR calc non Af Amer: 48 mL/min — ABNORMAL LOW (ref >60)
Glucose, Bld: 145 mg/dL — ABNORMAL HIGH (ref 70–99)
Potassium: 3.9 mmol/L (ref 3.5–5.1)
Sodium: 139 mmol/L (ref 135–145)
Total Bilirubin: 0.3 mg/dL (ref 0.3–1.2)
Total Protein: 6.8 g/dL (ref 6.5–8.1)

## 2019-05-28 LAB — CBC WITH DIFFERENTIAL/PLATELET
Abs Immature Granulocytes: 0.03 10*3/uL (ref 0.00–0.07)
Basophils Absolute: 0 10*3/uL (ref 0.0–0.1)
Basophils Relative: 0 %
Eosinophils Absolute: 0.2 10*3/uL (ref 0.0–0.5)
Eosinophils Relative: 2 %
HCT: 41.8 % (ref 36.0–46.0)
Hemoglobin: 13.3 g/dL (ref 12.0–15.0)
Immature Granulocytes: 0 %
Lymphocytes Relative: 49 %
Lymphs Abs: 5 10*3/uL — ABNORMAL HIGH (ref 0.7–4.0)
MCH: 31.4 pg (ref 26.0–34.0)
MCHC: 31.8 g/dL (ref 30.0–36.0)
MCV: 98.8 fL (ref 80.0–100.0)
Monocytes Absolute: 0.6 10*3/uL (ref 0.1–1.0)
Monocytes Relative: 6 %
Neutro Abs: 4.5 10*3/uL (ref 1.7–7.7)
Neutrophils Relative %: 43 %
Platelets: 266 10*3/uL (ref 150–400)
RBC: 4.23 MIL/uL (ref 3.87–5.11)
RDW: 13.7 % (ref 11.5–15.5)
WBC: 10.2 10*3/uL (ref 4.0–10.5)
nRBC: 0 % (ref 0.0–0.2)

## 2019-05-28 LAB — URINALYSIS, COMPLETE (UACMP) WITH MICROSCOPIC
Bilirubin Urine: NEGATIVE
Glucose, UA: 50 mg/dL — AB
Hgb urine dipstick: NEGATIVE
Ketones, ur: NEGATIVE mg/dL
Nitrite: POSITIVE — AB
Protein, ur: NEGATIVE mg/dL
Specific Gravity, Urine: 1.012 (ref 1.005–1.030)
WBC, UA: >50 WBC/hpf — ABNORMAL HIGH (ref 0–5)
pH: 7 (ref 5.0–8.0)

## 2019-05-28 LAB — T4, FREE: Free T4: 1.2 ng/dL — ABNORMAL HIGH (ref 0.61–1.12)

## 2019-05-28 LAB — CBG MONITORING, ED: Glucose-Capillary: 155 mg/dL — ABNORMAL HIGH (ref 70–99)

## 2019-05-28 LAB — TSH: TSH: 0.8 u[IU]/mL (ref 0.350–4.500)

## 2019-05-28 MED ORDER — CEPHALEXIN 250 MG PO CAPS: 500.0000 mg | ORAL_CAPSULE | Freq: Once | ORAL | Status: DC

## 2019-05-28 MED ORDER — SODIUM CHLORIDE 0.9 % IV BOLUS
1000.0000 mL | Freq: Once | INTRAVENOUS | Status: AC
  Administered 2019-05-28: 1000 mL via INTRAVENOUS

## 2019-05-28 MED ORDER — CEPHALEXIN 500 MG PO CAPS: 500.0000 mg | ORAL_CAPSULE | Freq: Four times a day (QID) | ORAL | 0 refills | Status: AC

## 2019-05-28 MED ORDER — SODIUM CHLORIDE 0.9 % IV SOLN
1.0000 g | Freq: Once | INTRAVENOUS | Status: AC
  Administered 2019-05-28: 1 g via INTRAVENOUS
  Filled 2019-05-28: qty 10

## 2019-05-28 NOTE — ED Notes (Signed)
P[employer] in place by tech Starla Link and second [place]

## 2019-05-28 NOTE — ED Triage Notes (Addendum)
Per EMS- Pt normally verbal. Has not been speaking since Wednesday. Had her eyes closed all day yesterday. Has hx of right sided deficits from a stroke. Pt having problems with recall. Pt has not been eating per her norm. 18G PIV to [place].

## 2019-05-28 NOTE — ED Provider Notes (Signed)
[facility] EMERGENCY DEPARTMENT Provider Note   CSN: [id number] Arrival date & time: 05/28/19  1749     History   Chief Complaint Chief Complaint  Patient presents with  . Altered Mental Status    HPI Megan [name] is a 85 y.o. female.     85 yo F with a chief complaint of decreased vocalization.  The family states that last Wednesday she was normal and then when she woke up that morning she was talking less than normal.  Stated that she was able to talk from time to time but was significantly less often and was a bit more confused than normal.  Yesterday the patient did not open her eyes all day though today seems better is been able to eat and drink without difficulty.  Is closer to baseline but is still confused on who the daughter is and who the other caretakers are.  They deny any recent change in medications.  They feel that she is more globally weak than normal.  Has baseline right-sided weakness due to a stroke in the past.  They deny any cough fever vomiting diarrhea or abdominal pain.  Denies chest pain.  The patient used to have these fairly often last year.  The last couple days and then resolved.  This 1 is lasted a bit longer than typical.  They called their family physician today who suggested they come to the ED for evaluation.  The history is provided by a relative.  Altered Mental Status Presenting symptoms: disorientation   Severity:  Moderate Most recent episode:  2 days ago Episode history:  Continuous Duration:  5 days Timing:  Constant Progression:  Waxing and waning Chronicity:  Recurrent Context: dementia   Context: not drug use and not head injury   Associated symptoms: no fever, no headaches, no nausea, no palpitations and no vomiting     Past Medical History:  Diagnosis Date  . Dementia (HCC)   . Diabetes mellitus   . Hyperlipidemia   . Pneumonia 10/2017  . Seizure (HCC)   . Sepsis (HCC) 11/24/2017  . Stroke [facility]) 2012   possible stroke Jan 2016  . UTI (urinary tract infection) 10/2017    Patient Active Problem List   Diagnosis Date Noted  . Failure to thrive in adult 02/07/2018  . Hypernatremia 02/07/2018  . History of ESBL Klebsiella pneumoniae infection 02/07/2018  . Acute encephalopathy 02/07/2018  . Dysphagia   . Acute metabolic encephalopathy 11/01/2017  . Pressure injury of skin 10/26/2017  . UTI (urinary tract infection) 10/26/2017  . Heel ulcer (HCC) 06/18/2017  . Unstageable pressure ulcer of sacral region (HCC) 06/18/2017  . AKI (acute kidney injury) (HCC) 06/18/2017  . Decubitus ulcer of sacral region, stage 2 (HCC)   . Dementia with Lewy bodies (HCC) 08/07/2013  . Vascular dementia (HCC) 08/07/2013  . Sleepiness 08/07/2013  . Protein-calorie malnutrition, severe (HCC) 07/15/2013  . Spastic hemiplegia affecting dominant side (HCC) 06/25/2013  . History of CVA w/residual right side deficits 09/03/2012  . History of partial seizures   . Diabetes mellitus type II, uncontrolled (HCC) 01/06/2012  . HTN (hypertension) 01/06/2012    Past Surgical History:  Procedure Laterality Date  . CATARACT EXTRACTION       OB History   No obstetric history on file.      Home Medications    Prior to Admission medications   Medication Sig Start Date End Date Taking? Authorizing Provider  acetaminophen (TYLENOL) 325 MG tablet Take  2 tablets (650 mg total) by mouth every 6 (six) hours as needed for mild pain (or Fever >/= 101). 11/04/17   Alwyn RenMathews, Elizabeth G, MD  Amino Acids-Protein Hydrolys (FEEDING SUPPLEMENT, PRO-STAT SUGAR FREE 64,) LIQD Take 30 mLs by mouth 4 (four) times daily -  with meals and at bedtime. Patient not taking: Reported on 03/08/2019 10/28/17   Narda BondsNettey, Ralph A, MD  bisacodyl (DULCOLAX) 10 MG suppository Place 10 mg rectally every three (3) days as needed for constipation. 01/31/18   [provider]  cephALEXin (KEFLEX) 500 MG capsule Take 1 capsule (500 mg total) by mouth  4 (four) times daily. 05/28/19   Melene PlanFloyd, Lovella Hardie, DO  ciprofloxacin (CIPRO) 500 MG tablet Take 1 tablet (500 mg total) by mouth 2 (two) times daily. Patient not taking: Reported on 03/08/2019 02/11/18   Maretta BeesGhimire, Shanker M, MD  clopidogrel (PLAVIX) 75 MG tablet Take 1 tablet (75 mg total) by mouth daily with breakfast. 08/15/13   [name], John, MD  divalproex (DEPAKOTE SPRINKLE) 125 MG capsule Take 250 mg by mouth 2 (two) times daily. 02/03/18   [provider]  Ensure Max Protein (ENSURE MAX PROTEIN) LIQD Take 11 oz by mouth 2 (two) times daily.    [provider]  famotidine (PEPCID) 20 MG tablet Take 1 tablet (20 mg total) by mouth daily. Patient not taking: Reported on 03/08/2019 10/29/17   Narda BondsNettey, Ralph A, MD  ferrous sulfate 325 (65 FE) MG tablet Take 325 mg by mouth daily with breakfast.    [provider]  fluticasone (FLONASE) 50 MCG/ACT nasal spray Place 1 spray into both nostrils daily as needed for allergies or rhinitis.    [provider]  insulin aspart (NOVOLOG FLEXPEN) 100 UNIT/ML FlexPen Inject 2 Units into the skin daily before supper.    [provider]  Insulin Glargine (TOUJEO MAX SOLOSTAR) 300 UNIT/ML SOPN Inject 5-9 Units into the skin at bedtime.    [provider]  lactobacillus acidophilus (BACID) TABS tablet Take 2 tablets by mouth 3 (three) times daily. Patient not taking: Reported on 03/08/2019 11/17/17   Regalado, Jon BillingsBelkys A, MD  loperamide (IMODIUM A-D) 2 MG tablet Take 1 tablet (2 mg total) by mouth 4 (four) times daily as needed for diarrhea or loose stools. 11/17/17   Regalado, Belkys A, MD  Melatonin 3 MG TABS Take 1 tablet (3 mg total) by mouth at bedtime as needed (sleep). 11/17/17   Regalado, Belkys A, MD  Multiple Vitamin (MULTIVITAMIN WITH MINERALS) TABS tablet Take 1 tablet by mouth daily. 11/18/17   Regalado, Belkys A, MD  NYSTATIN powder Apply 1 application topically 2 (two) times daily as needed for rash. 05/04/17    [provider]  ondansetron (ZOFRAN ODT) 4 MG disintegrating tablet Take 1 tablet (4 mg total) by mouth every 8 (eight) hours as needed. Patient taking differently: Take 4 mg by mouth every 8 (eight) hours as needed for nausea.  11/23/17   Mabe, Latanya MaudlinMartha L, MD  oxyCODONE (OXY IR/ROXICODONE) 5 MG immediate release tablet Take 2.5 mg by mouth every 4 (four) hours as needed for pain. 01/26/18   [provider]  protein supplement shake (PREMIER PROTEIN) LIQD Take 59.1 mLs (2 oz total) by mouth 4 (four) times daily -  with meals and at bedtime. Patient not taking: Reported on 03/08/2019 10/28/17   Narda BondsNettey, Ralph A, MD  vitamin C (ASCORBIC ACID) 500 MG tablet Take 500 mg by mouth daily.    [provider]  zinc oxide (BALMEX) 11.3 % CREA cream Apply 1 application topically daily as needed (for rash).     [provider]    Family History Family History  Problem Relation Age of Onset  . CAD Mother   . Hypertension Mother   . Diabetes Mother   . Stroke Mother     Social History Social History   Tobacco Use  . Smoking status: Never Smoker  . Smokeless tobacco: Former Neurosurgeon    Types: Snuff  . Tobacco comment: quit 50-60 years ago  Substance Use Topics  . Alcohol use: No    Alcohol/week: 0.0 standard drinks  . Drug use: No     Allergies   Lipitor [atorvastatin]   Review of Systems Review of Systems  Unable to perform ROS: Dementia  Constitutional: Positive for activity change. Negative for chills and fever.  HENT: Negative for congestion and rhinorrhea.   Eyes: Negative for redness and visual disturbance.  Respiratory: Negative for shortness of breath and wheezing.   Cardiovascular: Negative for chest pain and palpitations.  Gastrointestinal: Negative for nausea and vomiting.  Genitourinary: Negative for dysuria and urgency.  Musculoskeletal: Negative for arthralgias and myalgias.  Skin: Negative for pallor and wound.  Neurological: Positive for  speech difficulty. Negative for dizziness and headaches.     Physical Exam Updated Vital Signs BP 112/60   Pulse 83   Temp 97.7 F (36.5 C) (Oral)   Resp 17   SpO2 95%   Physical Exam Vitals signs and nursing note reviewed.  Constitutional:      General: She is not in acute distress.    Appearance: She is well-developed. She is not diaphoretic.  HENT:     Head: Normocephalic and atraumatic.  Eyes:     Pupils: Pupils are equal, round, and reactive to light.  Neck:     Musculoskeletal: Normal range of motion and neck supple.  Cardiovascular:     Rate and Rhythm: Normal rate and regular rhythm.     Heart sounds: No murmur. No friction rub. No gallop.   Pulmonary:     Effort: Pulmonary effort is normal.     Breath sounds: No wheezing or rales.  Abdominal:     General: There is no distension.     Palpations: Abdomen is soft.     Tenderness: There is no abdominal tenderness.  Musculoskeletal:        General: No tenderness.  Skin:    General: Skin is warm and dry.  Neurological:     Mental Status: She is alert.     Comments: Obvious right-sided facial droop.  At baseline per family.  Right upper extremity has some contracture to it.  Psychiatric:        Behavior: Behavior normal.      ED Treatments / Results  Labs (all labs ordered are listed, but only abnormal results are displayed) Labs Reviewed  CBC WITH DIFFERENTIAL/PLATELET - Abnormal; Notable for the following components:      Result Value   Lymphs Abs 5.0 (*)    All other components within normal limits  URINALYSIS, COMPLETE (UACMP) WITH MICROSCOPIC - Abnormal; Notable for the following components:   APPearance HAZY (*)    Glucose, UA 50 (*)    Nitrite POSITIVE (*)    Leukocytes,Ua LARGE (*)    WBC, UA >50 (*)    Bacteria, UA RARE (*)    All other components within normal limits  T4, FREE - Abnormal; Notable for the following components:  Free T4 1.20 (*)    All other components within normal limits   COMPREHENSIVE METABOLIC PANEL - Abnormal; Notable for the following components:   Glucose, Bld 145 (*)    BUN 25 (*)    Calcium 12.1 (*)    Albumin 2.6 (*)    GFR calc non Af Amer 48 (*)    GFR calc Af Amer 55 (*)    All other components within normal limits  CBG MONITORING, ED - Abnormal; Notable for the following components:   Glucose-Capillary 155 (*)    All other components within normal limits  POCT I-STAT EG7 - Abnormal; Notable for the following components:   pO2, Ven 125.0 (*)    Bicarbonate 29.9 (*)    Acid-Base Excess 4.0 (*)    Potassium 7.4 (*)    All other components within normal limits  URINE CULTURE  TSH    EKG EKG Interpretation  Date/Time:  Monday May 28 2019 17:58:19 EDT Ventricular Rate:  90 PR Interval:    QRS Duration: 78 QT Interval:  355 QTC Calculation: 435 R Axis:   -17 Text Interpretation:  Sinus rhythm Prolonged PR interval Borderline left axis deviation Low voltage, precordial leads No significant change since last tracing Confirmed by Melene Plan [phone]) on 05/28/2019 6:09:10 PM   Radiology Ct Head Wo Contrast  Result Date: 05/28/2019 CLINICAL DATA:  Nonverbal for 5 days. Altered mental status. History of right-sided deficits from a stroke. EXAM: CT HEAD WITHOUT CONTRAST TECHNIQUE: Contiguous axial images were obtained from the base of the skull through the vertex without intravenous contrast. COMPARISON:  CT head 12/05/2017. FINDINGS: Despite efforts by the technologist and patient, moderate motion artifact is present on today's exam and could not be eliminated. This reduces exam sensitivity and specificity. Brain: There is no evidence of acute intracranial hemorrhage, mass lesion, brain edema or extra-axial fluid collection. There is chronic atrophy with prominence of the ventricles and subarachnoid spaces. There are chronic small vessel ischemic changes in the periventricular white matter and basal ganglia bilaterally. The involvement of the  left basal ganglia appears progressive. There is no CT evidence of acute cortical infarction. Vascular: Diffuse intracranial vascular calcifications. No hyperdense vessel identified. Skull: Negative for fracture or focal lesion. Sinuses/Orbits: The visualized paranasal sinuses and mastoid air cells are clear. No orbital abnormalities are seen. Other: None. IMPRESSION: 1. Chronic small vessel ischemic changes in the left basal ganglia appear progressive compared with the previous CT from 12/05/2017. No acute infarct or hemorrhage identified. 2. The current study is moderately motion degraded. Electronically Signed   By: Carey Bullocks M.D.   On: 05/28/2019 21:02   Dg Chest Port 1 View  Result Date: 05/28/2019 CLINICAL DATA:  Stroke-like symptoms since Wednesday. EXAM: PORTABLE CHEST 1 VIEW COMPARISON:  Chest x-ray 03/08/2019 FINDINGS: The cardiac silhouette, mediastinal and hilar contours are within normal limits for age and stable. Stable tortuosity, ectasia and calcification of the thoracic aorta. Dense bibasilar scarring changes but no infiltrates or effusions. The bony thorax is intact. IMPRESSION: No acute cardiopulmonary findings. Chronic basilar scarring changes. Electronically Signed   By: Rudie Meyer M.D.   On: 05/28/2019 19:02    Procedures Procedures (including critical care time)  Medications Ordered in ED Medications  cephALEXin (KEFLEX) capsule 500 mg (has no administration in time range)  sodium chloride 0.9 % bolus 1,000 mL (0 mLs Intravenous Stopped 05/28/19 2243)  calcium gluconate 1 g in sodium chloride 0.9 % 100 mL IVPB (0 g Intravenous Stopped  05/28/19 2118)     Initial Impression / Assessment and Plan / ED Course  I have reviewed the triage vital signs and the nursing notes.  Pertinent labs & imaging results that were available during my care of the patient were reviewed by me and considered in my medical decision making (see chart for details).        85 yo F with a  chief complaints of altered mental status.  Going on for about a week.  Slightly improving today.  Called her family doctor and sent here for evaluation.  Patient is a history of dementia and is bedbound.  Will obtain work-up.  The patient's i-STAT 7 showed a potassium of 7.2.  EKG is not consistent with this though we will give a dose of calcium awaiting the CMP to result.  Patient's urine shows a nitrate positive urinary tract infection.  This may be the cause of her symptoms.  Discussed this with the family.  Will start on Keflex.  CMP is returned with a normal potassium level.  No significant renal dysfunction.  Thyroid level is essentially normal.  CT scan with very mild progressive ischemic changes from April of last year.  No acute infarct.  PCP follow-up.  11:47 PM:  I have discussed the diagnosis/risks/treatment options with the patient and family and believe the pt to be eligible for discharge home to follow-up with PCP. We also discussed returning to the ED immediately if new or worsening sx occur. We discussed the sx which are most concerning (e.g., sudden worsening pain, fever, inability to tolerate by mouth) that necessitate immediate return. Medications administered to the patient during their visit and any new prescriptions provided to the patient are listed below.  Medications given during this visit Medications  cephALEXin (KEFLEX) capsule 500 mg (has no administration in time range)  sodium chloride 0.9 % bolus 1,000 mL (0 mLs Intravenous Stopped 05/28/19 2243)  calcium gluconate 1 g in sodium chloride 0.9 % 100 mL IVPB (0 g Intravenous Stopped 05/28/19 2118)     The patient appears reasonably screen and/or stabilized for discharge and I doubt any other medical condition or other [facility] requiring further screening, evaluation, or treatment in the ED at this time prior to discharge.    Final Clinical Impressions(s) / ED Diagnoses   Final diagnoses:  Transient alteration of awareness   Acute cystitis without hematuria    ED Discharge Orders         Ordered    cephALEXin (KEFLEX) 500 MG capsule  4 times daily     05/28/19 2339           Deno Etienne, DO 05/28/19 2347

## 2019-05-28 NOTE — ED Notes (Signed)
Called pharmacy for calcium gluconate. 

## 2019-05-28 NOTE — Discharge Instructions (Signed)
Return for worsening symptoms.  Follow up with your family doc.  

## 2019-05-29 LAB — POCT I-STAT EG7
Acid-Base Excess: 4 mmol/L — ABNORMAL HIGH (ref 0.0–2.0)
Bicarbonate: 29.9 mmol/L — ABNORMAL HIGH (ref 20.0–28.0)
Calcium, Ion: 1.27 mmol/L (ref 1.15–1.40)
HCT: 40 % (ref 36.0–46.0)
Hemoglobin: 13.6 g/dL (ref 12.0–15.0)
O2 Saturation: 99 %
Potassium: 7.4 mmol/L (ref 3.5–5.1)
Sodium: 135 mmol/L (ref 135–145)
TCO2: 31 mmol/L (ref 22–32)
pCO2, Ven: 48.7 mmHg (ref 44.0–60.0)
pH, Ven: 7.397 (ref 7.250–7.430)
pO2, Ven: 125 mmHg — ABNORMAL HIGH (ref 32.0–45.0)

## 2019-05-31 LAB — URINE CULTURE: Culture: >=100000 — AB

## 2019-06-01 ENCOUNTER — Telehealth: Payer: Self-pay | Admitting: Emergency Medicine

## 2019-06-01 NOTE — Telephone Encounter (Signed)
Post ED Visit - Positive Culture Follow-up: Successful Patient Follow-Up  Culture assessed and recommendations reviewed by:  []  Elenor Quinones, Pharm.D. []  Heide Guile, Pharm.D., BCPS AQ-ID []  Parks Neptune, Pharm.D., BCPS []  Alycia Rossetti, Pharm.D., BCPS [x]  Mimi Pham, Pharm.D., BCPS, AAHIVP []  Legrand Como, Pharm.D., BCPS, AAHIVP []  Salome Arnt, PharmD, BCPS []  Johnnette Gourd, PharmD, BCPS []  Hughes Better, PharmD, BCPS []  Leeroy Cha, PharmD  Positive urine culture  []  Patient discharged without antimicrobial prescription and treatment is now indicated [x]  Organism is resistant to prescribed ED discharge antimicrobial []  Patient with positive blood cultures  Changes discussed with ED provider: Madilyn Hook PA New antibiotic prescription: Macrobid 100 mg PO BID x five days Called to [facility]) [phone]  Contacted patient"s daughter [name], date 06/01/2019, time [place] 06/01/2019, 11:34 AM

## 2019-07-10 DIAGNOSIS — Z87440 Personal history of urinary (tract) infections: Secondary | ICD-10-CM | POA: Diagnosis not present

## 2019-07-10 DIAGNOSIS — I69391 Dysphagia following cerebral infarction: Secondary | ICD-10-CM | POA: Diagnosis not present

## 2019-07-10 DIAGNOSIS — I6782 Cerebral ischemia: Secondary | ICD-10-CM | POA: Diagnosis not present

## 2019-07-10 DIAGNOSIS — L89312 Pressure ulcer of right buttock, stage 2: Secondary | ICD-10-CM | POA: Diagnosis not present

## 2019-07-10 DIAGNOSIS — F0280 Dementia in other diseases classified elsewhere without behavioral disturbance: Secondary | ICD-10-CM | POA: Diagnosis not present

## 2019-07-10 DIAGNOSIS — F0150 Vascular dementia without behavioral disturbance: Secondary | ICD-10-CM | POA: Diagnosis not present

## 2019-07-10 DIAGNOSIS — I10 Essential (primary) hypertension: Secondary | ICD-10-CM | POA: Diagnosis not present

## 2019-07-10 DIAGNOSIS — I69351 Hemiplegia and hemiparesis following cerebral infarction affecting right dominant side: Secondary | ICD-10-CM | POA: Diagnosis not present

## 2019-07-10 DIAGNOSIS — E119 Type 2 diabetes mellitus without complications: Secondary | ICD-10-CM | POA: Diagnosis not present

## 2019-07-10 DIAGNOSIS — G3183 Dementia with Lewy bodies: Secondary | ICD-10-CM | POA: Diagnosis not present

## 2019-07-10 DIAGNOSIS — Z794 Long term (current) use of insulin: Secondary | ICD-10-CM | POA: Diagnosis not present

## 2019-07-12 DIAGNOSIS — F0280 Dementia in other diseases classified elsewhere without behavioral disturbance: Secondary | ICD-10-CM | POA: Diagnosis not present

## 2019-07-12 DIAGNOSIS — G3183 Dementia with Lewy bodies: Secondary | ICD-10-CM | POA: Diagnosis not present

## 2019-07-12 DIAGNOSIS — F0150 Vascular dementia without behavioral disturbance: Secondary | ICD-10-CM | POA: Diagnosis not present

## 2019-07-12 DIAGNOSIS — I69391 Dysphagia following cerebral infarction: Secondary | ICD-10-CM | POA: Diagnosis not present

## 2019-07-12 DIAGNOSIS — I69351 Hemiplegia and hemiparesis following cerebral infarction affecting right dominant side: Secondary | ICD-10-CM | POA: Diagnosis not present

## 2019-07-12 DIAGNOSIS — I6782 Cerebral ischemia: Secondary | ICD-10-CM | POA: Diagnosis not present

## 2019-07-13 DIAGNOSIS — I6782 Cerebral ischemia: Secondary | ICD-10-CM | POA: Diagnosis not present

## 2019-07-13 DIAGNOSIS — F0280 Dementia in other diseases classified elsewhere without behavioral disturbance: Secondary | ICD-10-CM | POA: Diagnosis not present

## 2019-07-13 DIAGNOSIS — I69351 Hemiplegia and hemiparesis following cerebral infarction affecting right dominant side: Secondary | ICD-10-CM | POA: Diagnosis not present

## 2019-07-13 DIAGNOSIS — F0150 Vascular dementia without behavioral disturbance: Secondary | ICD-10-CM | POA: Diagnosis not present

## 2019-07-13 DIAGNOSIS — I69391 Dysphagia following cerebral infarction: Secondary | ICD-10-CM | POA: Diagnosis not present

## 2019-07-13 DIAGNOSIS — G3183 Dementia with Lewy bodies: Secondary | ICD-10-CM | POA: Diagnosis not present

## 2019-07-14 DIAGNOSIS — I69351 Hemiplegia and hemiparesis following cerebral infarction affecting right dominant side: Secondary | ICD-10-CM | POA: Diagnosis not present

## 2019-07-14 DIAGNOSIS — F0280 Dementia in other diseases classified elsewhere without behavioral disturbance: Secondary | ICD-10-CM | POA: Diagnosis not present

## 2019-07-14 DIAGNOSIS — G3183 Dementia with Lewy bodies: Secondary | ICD-10-CM | POA: Diagnosis not present

## 2019-07-14 DIAGNOSIS — I6782 Cerebral ischemia: Secondary | ICD-10-CM | POA: Diagnosis not present

## 2019-07-14 DIAGNOSIS — I69391 Dysphagia following cerebral infarction: Secondary | ICD-10-CM | POA: Diagnosis not present

## 2019-07-14 DIAGNOSIS — F0150 Vascular dementia without behavioral disturbance: Secondary | ICD-10-CM | POA: Diagnosis not present

## 2019-07-17 DIAGNOSIS — I69391 Dysphagia following cerebral infarction: Secondary | ICD-10-CM | POA: Diagnosis not present

## 2019-07-17 DIAGNOSIS — G3183 Dementia with Lewy bodies: Secondary | ICD-10-CM | POA: Diagnosis not present

## 2019-07-17 DIAGNOSIS — I6782 Cerebral ischemia: Secondary | ICD-10-CM | POA: Diagnosis not present

## 2019-07-17 DIAGNOSIS — F0150 Vascular dementia without behavioral disturbance: Secondary | ICD-10-CM | POA: Diagnosis not present

## 2019-07-17 DIAGNOSIS — I69351 Hemiplegia and hemiparesis following cerebral infarction affecting right dominant side: Secondary | ICD-10-CM | POA: Diagnosis not present

## 2019-07-17 DIAGNOSIS — F0280 Dementia in other diseases classified elsewhere without behavioral disturbance: Secondary | ICD-10-CM | POA: Diagnosis not present

## 2019-07-18 DIAGNOSIS — F0150 Vascular dementia without behavioral disturbance: Secondary | ICD-10-CM | POA: Diagnosis not present

## 2019-07-18 DIAGNOSIS — I6782 Cerebral ischemia: Secondary | ICD-10-CM | POA: Diagnosis not present

## 2019-07-18 DIAGNOSIS — I69351 Hemiplegia and hemiparesis following cerebral infarction affecting right dominant side: Secondary | ICD-10-CM | POA: Diagnosis not present

## 2019-07-18 DIAGNOSIS — I69391 Dysphagia following cerebral infarction: Secondary | ICD-10-CM | POA: Diagnosis not present

## 2019-07-18 DIAGNOSIS — G3183 Dementia with Lewy bodies: Secondary | ICD-10-CM | POA: Diagnosis not present

## 2019-07-18 DIAGNOSIS — F0280 Dementia in other diseases classified elsewhere without behavioral disturbance: Secondary | ICD-10-CM | POA: Diagnosis not present

## 2019-07-21 DIAGNOSIS — I69351 Hemiplegia and hemiparesis following cerebral infarction affecting right dominant side: Secondary | ICD-10-CM | POA: Diagnosis not present

## 2019-07-21 DIAGNOSIS — I69391 Dysphagia following cerebral infarction: Secondary | ICD-10-CM | POA: Diagnosis not present

## 2019-07-21 DIAGNOSIS — G3183 Dementia with Lewy bodies: Secondary | ICD-10-CM | POA: Diagnosis not present

## 2019-07-21 DIAGNOSIS — F0280 Dementia in other diseases classified elsewhere without behavioral disturbance: Secondary | ICD-10-CM | POA: Diagnosis not present

## 2019-07-21 DIAGNOSIS — F0150 Vascular dementia without behavioral disturbance: Secondary | ICD-10-CM | POA: Diagnosis not present

## 2019-07-21 DIAGNOSIS — I6782 Cerebral ischemia: Secondary | ICD-10-CM | POA: Diagnosis not present

## 2019-09-28 ENCOUNTER — Emergency Department (HOSPITAL_COMMUNITY)
Admission: EM | Admit: 2019-09-28 | Discharge: 2019-09-29 | Disposition: A | Discharge status: Home / Self Care | Attending: Emergency Medicine | Admitting: Emergency Medicine

## 2019-09-28 ENCOUNTER — Encounter (HOSPITAL_COMMUNITY): Payer: Self-pay | Admitting: Emergency Medicine

## 2019-09-28 ENCOUNTER — Emergency Department (HOSPITAL_COMMUNITY)

## 2019-09-28 ENCOUNTER — Other Ambulatory Visit: Payer: Self-pay

## 2019-09-28 DIAGNOSIS — E119 Type 2 diabetes mellitus without complications: Secondary | ICD-10-CM | POA: Diagnosis not present

## 2019-09-28 DIAGNOSIS — R630 Anorexia: Secondary | ICD-10-CM | POA: Diagnosis present

## 2019-09-28 DIAGNOSIS — F0390 Unspecified dementia without behavioral disturbance: Secondary | ICD-10-CM | POA: Insufficient documentation

## 2019-09-28 DIAGNOSIS — Z8673 Personal history of transient ischemic attack (TIA), and cerebral infarction without residual deficits: Secondary | ICD-10-CM | POA: Insufficient documentation

## 2019-09-28 DIAGNOSIS — Z87891 Personal history of nicotine dependence: Secondary | ICD-10-CM | POA: Insufficient documentation

## 2019-09-28 DIAGNOSIS — Z794 Long term (current) use of insulin: Secondary | ICD-10-CM | POA: Diagnosis not present

## 2019-09-28 DIAGNOSIS — N179 Acute kidney failure, unspecified: Secondary | ICD-10-CM | POA: Diagnosis not present

## 2019-09-28 DIAGNOSIS — Z79899 Other long term (current) drug therapy: Secondary | ICD-10-CM | POA: Insufficient documentation

## 2019-09-28 DIAGNOSIS — E860 Dehydration: Secondary | ICD-10-CM | POA: Insufficient documentation

## 2019-09-28 DIAGNOSIS — R531 Weakness: Secondary | ICD-10-CM | POA: Insufficient documentation

## 2019-09-28 DIAGNOSIS — I10 Essential (primary) hypertension: Secondary | ICD-10-CM | POA: Diagnosis not present

## 2019-09-28 DIAGNOSIS — R5383 Other fatigue: Secondary | ICD-10-CM | POA: Insufficient documentation

## 2019-09-28 DIAGNOSIS — R5381 Other malaise: Secondary | ICD-10-CM | POA: Diagnosis not present

## 2019-09-28 LAB — URINALYSIS, ROUTINE W REFLEX MICROSCOPIC
Bilirubin Urine: NEGATIVE
Glucose, UA: 150 mg/dL — AB
Ketones, ur: 5 mg/dL — AB
Nitrite: NEGATIVE
Protein, ur: 100 mg/dL — AB
Specific Gravity, Urine: 1.013 (ref 1.005–1.030)
WBC, UA: >50 WBC/hpf — ABNORMAL HIGH (ref 0–5)
pH: 5 (ref 5.0–8.0)

## 2019-09-28 LAB — CBC
HCT: 50.3 % — ABNORMAL HIGH (ref 36.0–46.0)
Hemoglobin: 15.4 g/dL — ABNORMAL HIGH (ref 12.0–15.0)
MCH: 31.2 pg (ref 26.0–34.0)
MCHC: 30.6 g/dL (ref 30.0–36.0)
MCV: 102 fL — ABNORMAL HIGH (ref 80.0–100.0)
Platelets: 227 10*3/uL (ref 150–400)
RBC: 4.93 MIL/uL (ref 3.87–5.11)
RDW: 14.4 % (ref 11.5–15.5)
WBC: 16.6 10*3/uL — ABNORMAL HIGH (ref 4.0–10.5)
nRBC: 0 % (ref 0.0–0.2)

## 2019-09-28 LAB — CBG MONITORING, ED: Glucose-Capillary: 233 mg/dL — ABNORMAL HIGH (ref 70–99)

## 2019-09-28 MED ORDER — SODIUM CHLORIDE 0.9 % IV SOLN
1.0000 g | Freq: Once | INTRAVENOUS | Status: AC
  Administered 2019-09-28: 20:00:00 1 g via INTRAVENOUS
  Filled 2019-09-28: qty 10

## 2019-09-28 MED ORDER — LACTATED RINGERS IV BOLUS
1000.0000 mL | Freq: Once | INTRAVENOUS | Status: AC
  Administered 2019-09-28: 17:00:00 1000 mL via INTRAVENOUS

## 2019-09-28 MED ORDER — INSULIN ASPART 100 UNIT/ML IV SOLN
5.0000 [IU] | Freq: Once | INTRAVENOUS | Status: AC
  Administered 2019-09-28: 5 [IU] via INTRAVENOUS

## 2019-09-28 MED ORDER — FOSFOMYCIN TROMETHAMINE 3 G PO PACK
3.0000 g | PACK | Freq: Once | ORAL | Status: AC
  Administered 2019-09-28: 20:00:00 3 g via ORAL
  Filled 2019-09-28: qty 3

## 2019-09-28 MED ORDER — SODIUM CHLORIDE 0.9 % IV SOLN: 1.0000 g | Freq: Once | INTRAVENOUS | Status: DC

## 2019-09-28 MED ORDER — LACTATED RINGERS IV BOLUS
1000.0000 mL | Freq: Once | INTRAVENOUS | Status: AC
  Administered 2019-09-28: 1000 mL via INTRAVENOUS

## 2019-09-28 MED ORDER — FOSFOMYCIN TROMETHAMINE 3 G PO PACK
3.0000 g | PACK | Freq: Once | ORAL | Status: DC
  Filled 2019-09-28: qty 3

## 2019-09-28 MED ORDER — DEXTROSE 50 % IV SOLN
1.0000 | Freq: Once | INTRAVENOUS | Status: AC
  Administered 2019-09-28: 20:00:00 50 mL via INTRAVENOUS
  Filled 2019-09-28: qty 50

## 2019-09-28 NOTE — ED Triage Notes (Signed)
Pt BIB [facility] from home. Family reports pt has had decreased oral intake and recently diagnosed with UTI. Pt responsive to voice. Pt currently on hospice care. EMS VS: BP 94/62, HR 102, RR 14, SpO2 97% Given 250ccNS PTA.

## 2019-09-28 NOTE — ED Notes (Signed)
Pt was able to intake PO fluids.

## 2019-09-28 NOTE — Discharge Instructions (Addendum)
I have spoken with the hospice care nurse, and she will assist with follow up either tomorrow or the next day. Your labs demonstrated acute kidney injury with likely UTI. You received an antibiotic, and were given medications to lower your potassium. F/u with PCP regarding kidney function and urine studies, which cultures are pending.

## 2019-09-28 NOTE — ED Provider Notes (Signed)
[facility] EMERGENCY DEPARTMENT Provider Note   CSN: [id number] Arrival date & time: 09/28/19  1524     History Chief Complaint  Patient presents with  . Urinary Tract Infection    Megan [name] is a 86 y.o. female.  HPI 86 year old female with extensive past medical history including UTI, stroke, sepsis, pneumonia, hyperlipidemia, diabetes, dementia, presenting to the emergency department for continued decreased p.o. intake, fatigue, functional decline as well as currently on ciprofloxacin for a UTI that was diagnosed 2 days ago by her PCP.  Daughter was concerned because patient has had decreased p.o. intake, fatigue and malaise, no fevers at home, patient is a hospice patient, DNR.  Patient does have history of stroke, no new focal neurological deficits, no trauma, no vomiting or diarrhea.  No sick contacts, no recent travel.  Patient had a urine culture done however cannot see the results in system.     Past Medical History:  Diagnosis Date  . Dementia (HCC)   . Diabetes mellitus   . Hyperlipidemia   . Pneumonia 10/2017  . Seizure (HCC)   . Sepsis (HCC) 11/24/2017  . Stroke [facility]) 2012   possible stroke Jan 2016  . UTI (urinary tract infection) 10/2017    Patient Active Problem List   Diagnosis Date Noted  . Failure to thrive in adult 02/07/2018  . Hypernatremia 02/07/2018  . History of ESBL Klebsiella pneumoniae infection 02/07/2018  . Acute encephalopathy 02/07/2018  . Dysphagia   . Acute metabolic encephalopathy 11/01/2017  . Pressure injury of skin 10/26/2017  . UTI (urinary tract infection) 10/26/2017  . Heel ulcer (HCC) 06/18/2017  . Unstageable pressure ulcer of sacral region (HCC) 06/18/2017  . AKI (acute kidney injury) (HCC) 06/18/2017  . Decubitus ulcer of sacral region, stage 2 (HCC)   . Dementia with Lewy bodies (HCC) 08/07/2013  . Vascular dementia (HCC) 08/07/2013  . Sleepiness 08/07/2013  . Protein-calorie malnutrition, severe (HCC)  07/15/2013  . Spastic hemiplegia affecting dominant side (HCC) 06/25/2013  . History of CVA w/residual right side deficits 09/03/2012  . History of partial seizures   . Diabetes mellitus type II, uncontrolled (HCC) 01/06/2012  . HTN (hypertension) 01/06/2012    Past Surgical History:  Procedure Laterality Date  . CATARACT EXTRACTION       OB History   No obstetric history on file.     Family History  Problem Relation Age of Onset  . CAD Mother   . Hypertension Mother   . Diabetes Mother   . Stroke Mother     Social History   Tobacco Use  . Smoking status: Never Smoker  . Smokeless tobacco: Former Neurosurgeon    Types: Snuff  . Tobacco comment: quit 50-60 years ago  Substance Use Topics  . Alcohol use: No    Alcohol/week: 0.0 standard drinks  . Drug use: No    Home Medications Prior to Admission medications   Medication Sig Start Date End Date Taking? Authorizing Provider  acetaminophen (TYLENOL) 325 MG tablet Take 2 tablets (650 mg total) by mouth every 6 (six) hours as needed for mild pain (or Fever >/= 101). 11/04/17   Alwyn Ren, MD  Amino Acids-Protein Hydrolys (FEEDING SUPPLEMENT, PRO-STAT SUGAR FREE 64,) LIQD Take 30 mLs by mouth 4 (four) times daily -  with meals and at bedtime. Patient not taking: Reported on 03/08/2019 10/28/17   Narda Bonds, MD  bisacodyl (DULCOLAX) 10 MG suppository Place 10 mg rectally every three (3) days as  needed for constipation. 01/31/18   [provider]  cephALEXin (KEFLEX) 500 MG capsule Take 1 capsule (500 mg total) by mouth 4 (four) times daily. 05/28/19   Melene Plan, DO  ciprofloxacin (CIPRO) 500 MG tablet Take 1 tablet (500 mg total) by mouth 2 (two) times daily. Patient not taking: Reported on 03/08/2019 02/11/18   Maretta Bees, MD  clopidogrel (PLAVIX) 75 MG tablet Take 1 tablet (75 mg total) by mouth daily with breakfast. 08/15/13   [name], MD  divalproex (DEPAKOTE SPRINKLE) 125 MG capsule Take 250 mg  by mouth 2 (two) times daily. 02/03/18   [provider]  Ensure Max Protein (ENSURE MAX PROTEIN) LIQD Take 11 oz by mouth 2 (two) times daily.    [provider]  famotidine (PEPCID) 20 MG tablet Take 1 tablet (20 mg total) by mouth daily. Patient not taking: Reported on 03/08/2019 10/29/17   Narda Bonds, MD  ferrous sulfate 325 (65 FE) MG tablet Take 325 mg by mouth daily with breakfast.    [provider]  fluticasone (FLONASE) 50 MCG/ACT nasal spray Place 1 spray into both nostrils daily as needed for allergies or rhinitis.    [provider]  insulin aspart (NOVOLOG FLEXPEN) 100 UNIT/ML FlexPen Inject 2 Units into the skin daily before supper.    [provider]  Insulin Glargine (TOUJEO MAX SOLOSTAR) 300 UNIT/ML SOPN Inject 5-9 Units into the skin at bedtime.    [provider]  lactobacillus acidophilus (BACID) TABS tablet Take 2 tablets by mouth 3 (three) times daily. Patient not taking: Reported on 03/08/2019 11/17/17   Regalado, Jon Billings A, MD  loperamide (IMODIUM A-D) 2 MG tablet Take 1 tablet (2 mg total) by mouth 4 (four) times daily as needed for diarrhea or loose stools. 11/17/17   Regalado, Belkys A, MD  Melatonin 3 MG TABS Take 1 tablet (3 mg total) by mouth at bedtime as needed (sleep). 11/17/17   Regalado, Belkys A, MD  Multiple Vitamin (MULTIVITAMIN WITH MINERALS) TABS tablet Take 1 tablet by mouth daily. 11/18/17   Regalado, Belkys A, MD  NYSTATIN powder Apply 1 application topically 2 (two) times daily as needed for rash. 05/04/17   [provider]  ondansetron (ZOFRAN ODT) 4 MG disintegrating tablet Take 1 tablet (4 mg total) by mouth every 8 (eight) hours as needed. Patient taking differently: Take 4 mg by mouth every 8 (eight) hours as needed for nausea.  11/23/17   Mabe, Latanya Maudlin, MD  oxyCODONE (OXY IR/ROXICODONE) 5 MG immediate release tablet Take 2.5 mg by mouth every 4 (four) hours as needed for pain. 01/26/18   [provider]  protein supplement shake (PREMIER PROTEIN) LIQD Take 59.1 mLs (2 oz total) by mouth 4 (four) times daily -  with meals and at bedtime. Patient not taking: Reported on 03/08/2019 10/28/17   Narda Bonds, MD  vitamin C (ASCORBIC ACID) 500 MG tablet Take 500 mg by mouth daily.    [provider]  zinc oxide (BALMEX) 11.3 % CREA cream Apply 1 application topically daily as needed (for rash).     [provider]    Allergies    Lipitor [atorvastatin]  Review of Systems   Review of Systems  Constitutional: Positive for activity change and fatigue. Negative for chills and fever.  HENT: Negative for ear pain and sore throat.   Eyes: Negative for pain and visual disturbance.  Respiratory: Negative for cough and shortness of breath.  Cardiovascular: Negative for chest pain and palpitations.  Gastrointestinal: Negative for abdominal pain and vomiting.  Genitourinary: Negative for dysuria and hematuria.  Musculoskeletal: Negative for arthralgias and back pain.  Skin: Negative for color change and rash.  Neurological: Positive for weakness. Negative for seizures and syncope.  All other systems reviewed and are negative.   Physical Exam Updated Vital Signs BP 120/67   Pulse (!) 103   Temp (!) 97.5 F (36.4 C) (Oral)   Resp 16   SpO2 96%   Physical Exam Vitals and nursing note reviewed.  Constitutional:      General: She is not in acute distress.    Appearance: She is well-developed. She is not ill-appearing, toxic-appearing or diaphoretic.     Comments: Elderly, frail, responds to verbal stimuli, intermittent comprehensible speech, drowsy  HENT:     Head: Normocephalic and atraumatic.     Right Ear: External ear normal.     Left Ear: External ear normal.     Nose: Nose normal.     Mouth/Throat:     Mouth: Mucous membranes are dry.     Pharynx: Oropharynx is clear.  Eyes:     Conjunctiva/sclera: Conjunctivae normal.  Cardiovascular:     Rate and  Rhythm: Normal rate and regular rhythm.     Heart sounds: No murmur.  Pulmonary:     Effort: Pulmonary effort is normal. No respiratory distress.     Breath sounds: Normal breath sounds.  Abdominal:     Palpations: Abdomen is soft.     Tenderness: There is no abdominal tenderness. There is no guarding or rebound.     Hernia: No hernia is present.  Musculoskeletal:        General: No swelling or tenderness.     Cervical back: Neck supple.  Skin:    General: Skin is warm and dry.  Neurological:     Mental Status: She is disoriented.     Motor: Weakness (global weakness) present.  Psychiatric:        Mood and Affect: Mood normal.        Behavior: Behavior normal.     ED Results / Procedures / Treatments   Labs (all labs ordered are listed, but only abnormal results are displayed) Labs Reviewed  URINALYSIS, ROUTINE W REFLEX MICROSCOPIC - Abnormal; Notable for the following components:      Result Value   Color, Urine AMBER (*)    APPearance TURBID (*)    Glucose, UA 150 (*)    Hgb urine dipstick SMALL (*)    Ketones, ur 5 (*)    Protein, ur 100 (*)    Leukocytes,Ua LARGE (*)    WBC, UA >50 (*)    Bacteria, UA MANY (*)    All other components within normal limits  BASIC METABOLIC PANEL - Abnormal; Notable for the following components:   Sodium 151 (*)    Potassium 6.3 (*)    Chloride 113 (*)    Glucose, Bld 229 (*)    BUN 102 (*)    Creatinine, Ser 4.31 (*)    Calcium 11.2 (*)    GFR calc non Af Amer 8 (*)    GFR calc Af Amer 9 (*)    All other components within normal limits  CBC - Abnormal; Notable for the following components:   WBC 16.6 (*)    Hemoglobin 15.4 (*)    HCT 50.3 (*)    MCV 102.0 (*)    All other components within  normal limits  CBG MONITORING, ED - Abnormal; Notable for the following components:   Glucose-Capillary 233 (*)    All other components within normal limits  URINE CULTURE    EKG EKG Interpretation  Date/Time:  Friday September 28 2019 15:58:26 EST Ventricular Rate:  110 PR Interval:    QRS Duration: 77 QT Interval:  307 QTC Calculation: 416 R Axis:   5 Text Interpretation: Sinus tachycardia No acute changes Confirmed by Derwood Kaplan [phone]) on 09/28/2019 5:29:42 PM   Radiology DG Chest Portable 1 View  Result Date: 09/28/2019 CLINICAL DATA:  Fatigue EXAM: PORTABLE CHEST 1 VIEW COMPARISON:  05/28/2019 FINDINGS: Reticulation at the left more than right base, present for multiple years. No superimposed acute opacity. Normal heart size and mediastinal contours. No edema, effusion, or pneumothorax. Normal heart size and mediastinal contours. IMPRESSION: Chronic fibrotic changes at the lung bases. No acute finding when compared with priors. Electronically Signed   By: Marnee Spring M.D.   On: 09/28/2019 17:47    Procedures Procedures (including critical care time)  Medications Ordered in ED Medications  lactated ringers bolus 1,000 mL (0 mLs Intravenous Stopped 09/28/19 1910)  insulin aspart (novoLOG) injection 5 Units (5 Units Intravenous Given 09/28/19 1932)    And  dextrose 50 % solution 50 mL (50 mLs Intravenous Given 09/28/19 1932)  calcium gluconate 1 g in sodium chloride 0.9 % 100 mL IVPB (0 g Intravenous Stopped 09/28/19 2038)  lactated ringers bolus 1,000 mL (1,000 mLs Intravenous New Bag/Given 09/28/19 1936)  fosfomycin (MONUROL) packet 3 g (3 g Oral Given 09/28/19 2013)    ED Course  I have reviewed the triage vital signs and the nursing notes.  Pertinent labs & imaging results that were available during my care of the patient were reviewed by me and considered in my medical decision making (see chart for details).    MDM Rules/Calculators/A&P                      86 year old female presenting with fatigue, decreased p.o. intake.  Patient is DNR, hospice patient currently.  Daughter was concerned that she is not taking enough p.o.  On arrival patient was mildly tachycardic, otherwise hemodynamically stable,  afebrile.  Daughter states that patient currently seems to be at her mental baseline but continues to be more drowsy than usual, however she states this is been her course for the past few months, deteriorating functional decline.  Patient currently on ciprofloxacin for UTI, I was unable to find urine culture data as well as a UA from the PCPs office.  Daughter is also concerned that patient glucose has been erratic as well as her kidney function she was told that it is declining.  Patient appears frail and elderly, dehydrated, however does not appear septic or toxic at this time.  Likely expected disease course of her dementia, age as well as her functional decline from her chronic comorbidities.  We will fluid rehydrate, check basic labs and urine studies. Initial EKG with sinus tachycardia, no signs of acute ischemic changes or arrhythmias.  Patient metabolic panel remarkable for acute renal failure, creatinine at 4, baseline around 1.  Potassium elevated as well, given hyperkalemic medications including calcium gluconate as well as insulin and dextrose, patient's T waves are mildly peaked in V2.  P wave still present, no QRS widening noted.  Patient hemoconcentrated on her CBC, hemoglobin, WBC as well as hematocrit elevated.  Likely dehydration, patient given fluid rehydration in  the ED.  Patient is a hospice care patient, DNR, spoke with daughter, who would like to go home, see hospice care, think this is reasonable., fosfomycin given for UTI as a one-time dose.  Daughter chose to be discharged and go home, hospice care was contacted, care nurse was contacted, they will follow up with patient in the next day or 2.  I explained to the daughter that patient's kidney function could either improve however this is very low unlikely given her history, likely will continue to deteriorate and proceed likely into  increased morbidity and mortality at home.  Daughter agreed and understood.  Given patient is a hospice  care patient, I think it is more beneficial for patient to go home and be made comfortable with medications and continued hospice support versus admission for her AKI.  Daughter agrees.  Patient was discharged home with daughter.  The attending physician was present and available for all medical decision making and procedures related to this patient's care.     Final Clinical Impression(s) / ED Diagnoses Final diagnoses:  Weakness  Dehydration  Acute renal failure, unspecified acute renal failure type [facility])    Rx / DC Orders ED Discharge Orders    None       Kizzie Fantasia, MD 09/28/19 2245    Varney Biles, MD 09/29/19 [id number]

## 2019-09-28 NOTE — Progress Notes (Signed)
[facility]:  [facility]  Pt is a current active pt with our hospice services. She had DNR in home. Pt has had decline over past couple weeks and nursing has been educating to the family however the daughter has had a hard time coping with the information that we have been giving her. She has been seen daily for decline over past 3 days. Both daughters Dennie Bible and [name] were present with nurse visit today which was made for continued decline and our nurse feeling she was transitioning to active dying phase.  Dr. Danae Chen also spoke to both daughters over the phone at this visit and encouraged this was normal part of dying process due to the pt continued decline and no urine output in 24 hours now. He encouraged her to continue to allow pt to pass peacefully in the home surround by family and not proceed with hospitalization. However family was not in agreement with this and called 911.  Therefore she was brought to the ED.   Please call with concerns [phone] 8am-500pm-- 7 days a week/  [phone] all other times to speak with nurses on call. Norm Parcel RN

## 2019-09-28 NOTE — ED Notes (Signed)
Ptar called for pt, stated there a few people ahead on the list. Will be a little

## 2019-09-29 LAB — BASIC METABOLIC PANEL
Anion gap: 14 (ref 5–15)
BUN: 102 mg/dL — ABNORMAL HIGH (ref 8–23)
CO2: 24 mmol/L (ref 22–32)
Calcium: 11.2 mg/dL — ABNORMAL HIGH (ref 8.9–10.3)
Chloride: 113 mmol/L — ABNORMAL HIGH (ref 98–111)
Creatinine, Ser: 4.31 mg/dL — ABNORMAL HIGH (ref 0.44–1.00)
GFR calc Af Amer: 9 mL/min — ABNORMAL LOW (ref >60)
GFR calc non Af Amer: 8 mL/min — ABNORMAL LOW (ref >60)
Glucose, Bld: 229 mg/dL — ABNORMAL HIGH (ref 70–99)
Potassium: 6.3 mmol/L (ref 3.5–5.1)
Sodium: 151 mmol/L — ABNORMAL HIGH (ref 135–145)

## 2019-10-02 LAB — URINE CULTURE: Culture: >=100000 — AB

## 2019-10-03 ENCOUNTER — Telehealth: Payer: Self-pay | Admitting: *Deleted

## 2019-10-03 NOTE — Telephone Encounter (Signed)
Post ED Visit - Positive Culture Follow-up  Culture report reviewed by antimicrobial stewardship pharmacist: [facility] []  , Pharm.D. []  Enzo Bi, Pharm.D., BCPS AQ-ID []  , Pharm.D., BCPS []  Celedonio Miyamoto, .D., BCPS []  [place], .D., BCPS, AAHIVP []  Georgina Pillion, Pharm.D., BCPS, AAHIVP [x]  [address], PharmD, BCPS []  , PharmD, BCPS []  [place], PharmD, BCPS []  [address], PharmD []  , PharmD, BCPS []  Estella Husk, PharmD  Pharmacy Team []  Lysle Pearl, PharmD []  , PharmD []  Phillips Climes, PharmD []  , Rph []  Agapito Games) , PharmD []  Verlan Friends, PharmD []  , PharmD []  Mervyn Gay, PharmD []  , PharmD []  Vinnie Level, PharmD []  [facility], PharmD []  , PharmD []  Len Childs, PharmD   Positive urine culture Treated with Fosfomycin in the ED, organism sensitive to the same and no further patient follow-up is required at this time.  [facility] 10/03/2019, 9:35 AM

## 2019-10-22 DEATH — deceased
